# Patient Record
Sex: Male | Born: 1935
Health system: Southern US, Community
[De-identification: ages and names within clinical notes are randomized; demographics above are authoritative.]

## PROBLEM LIST (undated history)

## (undated) DIAGNOSIS — D696 Thrombocytopenia, unspecified: Secondary | ICD-10-CM

## (undated) DIAGNOSIS — G43109 Migraine with aura, not intractable, without status migrainosus: Secondary | ICD-10-CM

## (undated) DIAGNOSIS — T7840XA Allergy, unspecified, initial encounter: Secondary | ICD-10-CM

## (undated) DIAGNOSIS — J302 Other seasonal allergic rhinitis: Secondary | ICD-10-CM

## (undated) DIAGNOSIS — B029 Zoster without complications: Secondary | ICD-10-CM

## (undated) DIAGNOSIS — Z9289 Personal history of other medical treatment: Secondary | ICD-10-CM

## (undated) DIAGNOSIS — I499 Cardiac arrhythmia, unspecified: Secondary | ICD-10-CM

## (undated) DIAGNOSIS — J42 Unspecified chronic bronchitis: Secondary | ICD-10-CM

## (undated) DIAGNOSIS — K219 Gastro-esophageal reflux disease without esophagitis: Secondary | ICD-10-CM

## (undated) DIAGNOSIS — J189 Pneumonia, unspecified organism: Secondary | ICD-10-CM

## (undated) DIAGNOSIS — M316 Other giant cell arteritis: Secondary | ICD-10-CM

## (undated) DIAGNOSIS — F419 Anxiety disorder, unspecified: Secondary | ICD-10-CM

## (undated) DIAGNOSIS — I10 Essential (primary) hypertension: Secondary | ICD-10-CM

## (undated) DIAGNOSIS — I2510 Atherosclerotic heart disease of native coronary artery without angina pectoris: Secondary | ICD-10-CM

## (undated) DIAGNOSIS — C679 Malignant neoplasm of bladder, unspecified: Secondary | ICD-10-CM

## (undated) DIAGNOSIS — H9190 Unspecified hearing loss, unspecified ear: Secondary | ICD-10-CM

## (undated) DIAGNOSIS — H269 Unspecified cataract: Secondary | ICD-10-CM

## (undated) HISTORY — DX: Zoster without complications: B02.9

## (undated) HISTORY — DX: Unspecified cataract: H26.9

## (undated) HISTORY — PX: OTHER SURGICAL HISTORY: SHX169

## (undated) HISTORY — PX: SKIN GRAFT: SHX250

## (undated) HISTORY — DX: Atherosclerotic heart disease of native coronary artery without angina pectoris: I25.10

## (undated) HISTORY — DX: Anxiety disorder, unspecified: F41.9

## (undated) HISTORY — DX: Thrombocytopenia, unspecified: D69.6

## (undated) HISTORY — PX: VASECTOMY: SHX75

## (undated) HISTORY — PX: TONSILLECTOMY: SUR1361

## (undated) HISTORY — DX: Allergy, unspecified, initial encounter: T78.40XA

## (undated) HISTORY — PX: TRANSURETHRAL RESECTION OF BLADDER TUMOR: SHX2575

## (undated) HISTORY — PX: COLONOSCOPY: SHX174

---
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
## 1962-06-14 HISTORY — PX: OTHER SURGICAL HISTORY: SHX169

## 1997-11-19 ENCOUNTER — Encounter: Admission: RE | Admit: 1997-11-19 | Discharge: 1998-02-17 | Payer: Self-pay | Admitting: Anesthesiology

## 1998-01-16 ENCOUNTER — Ambulatory Visit (HOSPITAL_COMMUNITY): Admission: RE | Admit: 1998-01-16 | Discharge: 1998-01-16 | Payer: Self-pay | Admitting: Specialist

## 1998-01-19 ENCOUNTER — Inpatient Hospital Stay (HOSPITAL_COMMUNITY): Admission: EM | Admit: 1998-01-19 | Discharge: 1998-01-21 | Payer: Self-pay

## 1998-04-30 ENCOUNTER — Ambulatory Visit (HOSPITAL_BASED_OUTPATIENT_CLINIC_OR_DEPARTMENT_OTHER): Admission: RE | Admit: 1998-04-30 | Discharge: 1998-04-30 | Payer: Self-pay | Admitting: *Deleted

## 1998-09-24 ENCOUNTER — Encounter: Payer: Self-pay | Admitting: Family Medicine

## 1998-09-24 ENCOUNTER — Inpatient Hospital Stay (HOSPITAL_COMMUNITY): Admission: EM | Admit: 1998-09-24 | Discharge: 1998-09-26 | Payer: Self-pay | Admitting: Emergency Medicine

## 1998-09-25 ENCOUNTER — Encounter: Payer: Self-pay | Admitting: Family Medicine

## 1999-09-09 ENCOUNTER — Encounter: Admission: RE | Admit: 1999-09-09 | Discharge: 1999-09-09 | Payer: Self-pay | Admitting: Family Medicine

## 1999-09-09 ENCOUNTER — Encounter: Payer: Self-pay | Admitting: Family Medicine

## 1999-12-10 ENCOUNTER — Emergency Department (HOSPITAL_COMMUNITY): Admission: EM | Admit: 1999-12-10 | Discharge: 1999-12-10 | Payer: Self-pay | Admitting: Emergency Medicine

## 1999-12-10 ENCOUNTER — Encounter: Payer: Self-pay | Admitting: Emergency Medicine

## 2000-07-28 ENCOUNTER — Ambulatory Visit (HOSPITAL_COMMUNITY): Admission: RE | Admit: 2000-07-28 | Discharge: 2000-07-28 | Payer: Self-pay | Admitting: Gastroenterology

## 2001-11-09 ENCOUNTER — Encounter: Payer: Self-pay | Admitting: Emergency Medicine

## 2001-11-09 ENCOUNTER — Emergency Department (HOSPITAL_COMMUNITY): Admission: EM | Admit: 2001-11-09 | Discharge: 2001-11-09 | Payer: Self-pay | Admitting: Emergency Medicine

## 2001-11-14 ENCOUNTER — Emergency Department (HOSPITAL_COMMUNITY): Admission: EM | Admit: 2001-11-14 | Discharge: 2001-11-14 | Payer: Self-pay | Admitting: Emergency Medicine

## 2001-11-14 ENCOUNTER — Encounter: Payer: Self-pay | Admitting: Emergency Medicine

## 2001-12-29 ENCOUNTER — Ambulatory Visit (HOSPITAL_COMMUNITY): Admission: RE | Admit: 2001-12-29 | Discharge: 2001-12-29 | Payer: Self-pay | Admitting: Neurology

## 2001-12-29 ENCOUNTER — Encounter: Payer: Self-pay | Admitting: Neurology

## 2008-06-14 DIAGNOSIS — C679 Malignant neoplasm of bladder, unspecified: Secondary | ICD-10-CM

## 2008-06-14 HISTORY — DX: Malignant neoplasm of bladder, unspecified: C67.9

## 2008-10-02 ENCOUNTER — Encounter (INDEPENDENT_AMBULATORY_CARE_PROVIDER_SITE_OTHER): Payer: Self-pay | Admitting: Urology

## 2008-10-02 ENCOUNTER — Ambulatory Visit (HOSPITAL_BASED_OUTPATIENT_CLINIC_OR_DEPARTMENT_OTHER): Admission: RE | Admit: 2008-10-02 | Discharge: 2008-10-02 | Payer: Self-pay | Admitting: Urology

## 2008-10-21 ENCOUNTER — Encounter (INDEPENDENT_AMBULATORY_CARE_PROVIDER_SITE_OTHER): Payer: Self-pay | Admitting: Urology

## 2008-10-21 ENCOUNTER — Ambulatory Visit (HOSPITAL_COMMUNITY): Admission: RE | Admit: 2008-10-21 | Discharge: 2008-10-21 | Payer: Self-pay | Admitting: Urology

## 2008-10-31 ENCOUNTER — Emergency Department (HOSPITAL_COMMUNITY): Admission: EM | Admit: 2008-10-31 | Discharge: 2008-10-31 | Payer: Self-pay | Admitting: Emergency Medicine

## 2008-11-23 ENCOUNTER — Inpatient Hospital Stay (HOSPITAL_COMMUNITY): Admission: EM | Admit: 2008-11-23 | Discharge: 2008-11-25 | Payer: Self-pay | Admitting: Emergency Medicine

## 2009-03-13 ENCOUNTER — Ambulatory Visit (HOSPITAL_BASED_OUTPATIENT_CLINIC_OR_DEPARTMENT_OTHER): Admission: RE | Admit: 2009-03-13 | Discharge: 2009-03-13 | Payer: Self-pay | Admitting: Urology

## 2009-03-13 ENCOUNTER — Encounter (INDEPENDENT_AMBULATORY_CARE_PROVIDER_SITE_OTHER): Payer: Self-pay | Admitting: Urology

## 2009-08-04 ENCOUNTER — Encounter: Admission: RE | Admit: 2009-08-04 | Discharge: 2009-08-04 | Payer: Self-pay | Admitting: Family Medicine

## 2010-09-11 ENCOUNTER — Emergency Department (HOSPITAL_COMMUNITY)
Admission: EM | Admit: 2010-09-11 | Discharge: 2010-09-11 | Disposition: A | Discharge status: Home / Self Care | Payer: Medicare Other | Attending: Emergency Medicine | Admitting: Emergency Medicine

## 2010-09-11 DIAGNOSIS — Z79899 Other long term (current) drug therapy: Secondary | ICD-10-CM | POA: Insufficient documentation

## 2010-09-11 DIAGNOSIS — S30860A Insect bite (nonvenomous) of lower back and pelvis, initial encounter: Secondary | ICD-10-CM | POA: Insufficient documentation

## 2010-09-11 DIAGNOSIS — W57XXXA Bitten or stung by nonvenomous insect and other nonvenomous arthropods, initial encounter: Secondary | ICD-10-CM | POA: Insufficient documentation

## 2010-09-11 DIAGNOSIS — Z8551 Personal history of malignant neoplasm of bladder: Secondary | ICD-10-CM | POA: Insufficient documentation

## 2010-09-18 LAB — POCT HEMOGLOBIN-HEMACUE: Hemoglobin: 16.6 g/dL (ref 13.0–17.0)

## 2010-09-21 LAB — URINALYSIS, ROUTINE W REFLEX MICROSCOPIC
Glucose, UA: NEGATIVE mg/dL
Ketones, ur: 40 mg/dL — AB
Nitrite: POSITIVE — AB
Protein, ur: >300 mg/dL — AB
Specific Gravity, Urine: 1.01 (ref 1.005–1.030)
Urobilinogen, UA: 1 mg/dL (ref 0.0–1.0)
pH: 5 (ref 5.0–8.0)

## 2010-09-21 LAB — CBC
HCT: 42.7 % (ref 39.0–52.0)
Hemoglobin: 14.3 g/dL (ref 13.0–17.0)
MCHC: 33.6 g/dL (ref 30.0–36.0)
MCV: 96.4 fL (ref 78.0–100.0)
Platelets: 129 10*3/uL — ABNORMAL LOW (ref 150–400)
RBC: 4.43 MIL/uL (ref 4.22–5.81)
RDW: 13.1 % (ref 11.5–15.5)
WBC: 8.3 10*3/uL (ref 4.0–10.5)

## 2010-09-21 LAB — DIFFERENTIAL
Basophils Absolute: 0.1 10*3/uL (ref 0.0–0.1)
Basophils Relative: 1 % (ref 0–1)
Eosinophils Absolute: 0.2 10*3/uL (ref 0.0–0.7)
Eosinophils Relative: 3 % (ref 0–5)
Lymphocytes Relative: 12 % (ref 12–46)
Lymphs Abs: 1 10*3/uL (ref 0.7–4.0)
Monocytes Absolute: 0.3 10*3/uL (ref 0.1–1.0)
Monocytes Relative: 4 % (ref 3–12)
Neutro Abs: 6.7 10*3/uL (ref 1.7–7.7)
Neutrophils Relative %: 80 % — ABNORMAL HIGH (ref 43–77)

## 2010-09-21 LAB — POCT I-STAT, CHEM 8
BUN: 15 mg/dL (ref 6–23)
Calcium, Ion: 1.04 mmol/L — ABNORMAL LOW (ref 1.12–1.32)
Chloride: 109 mEq/L (ref 96–112)
Creatinine, Ser: 1.5 mg/dL (ref 0.4–1.5)
Glucose, Bld: 138 mg/dL — ABNORMAL HIGH (ref 70–99)
HCT: 40 % (ref 39.0–52.0)
Hemoglobin: 13.6 g/dL (ref 13.0–17.0)
Potassium: 3.7 mEq/L (ref 3.5–5.1)
Sodium: 140 mEq/L (ref 135–145)
TCO2: 19 mmol/L (ref 0–100)

## 2010-09-21 LAB — URINE CULTURE
Colony Count: NO GROWTH
Culture: NO GROWTH

## 2010-09-21 LAB — URINE MICROSCOPIC-ADD ON

## 2010-09-22 LAB — BASIC METABOLIC PANEL
BUN: 13 mg/dL (ref 6–23)
CO2: 29 mEq/L (ref 19–32)
Calcium: 9.2 mg/dL (ref 8.4–10.5)
Chloride: 112 mEq/L (ref 96–112)
Creatinine, Ser: 1.24 mg/dL (ref 0.4–1.5)
GFR calc Af Amer: >60 mL/min (ref >60)
GFR calc non Af Amer: 57 mL/min — ABNORMAL LOW (ref >60)
Glucose, Bld: 109 mg/dL — ABNORMAL HIGH (ref 70–99)
Potassium: 4.6 mEq/L (ref 3.5–5.1)
Sodium: 147 mEq/L — ABNORMAL HIGH (ref 135–145)

## 2010-09-22 LAB — POCT CARDIAC MARKERS
CKMB, poc: <1 ng/mL — ABNORMAL LOW (ref 1.0–8.0)
Myoglobin, poc: 83.1 ng/mL (ref 12–200)
Troponin i, poc: <0.05 ng/mL (ref 0.00–0.09)

## 2010-09-22 LAB — URINE MICROSCOPIC-ADD ON

## 2010-09-22 LAB — URINALYSIS, ROUTINE W REFLEX MICROSCOPIC
Bilirubin Urine: NEGATIVE
Glucose, UA: NEGATIVE mg/dL
Ketones, ur: NEGATIVE mg/dL
Nitrite: NEGATIVE
Protein, ur: NEGATIVE mg/dL
Specific Gravity, Urine: 1.009 (ref 1.005–1.030)
Urobilinogen, UA: 1 mg/dL (ref 0.0–1.0)
pH: 6.5 (ref 5.0–8.0)

## 2010-09-22 LAB — DIFFERENTIAL
Basophils Absolute: 0 10*3/uL (ref 0.0–0.1)
Basophils Relative: 0 % (ref 0–1)
Eosinophils Absolute: 0.2 10*3/uL (ref 0.0–0.7)
Eosinophils Relative: 2 % (ref 0–5)
Lymphocytes Relative: 7 % — ABNORMAL LOW (ref 12–46)
Lymphs Abs: 0.9 10*3/uL (ref 0.7–4.0)
Monocytes Absolute: 0.8 10*3/uL (ref 0.1–1.0)
Monocytes Relative: 6 % (ref 3–12)
Neutro Abs: 10.3 10*3/uL — ABNORMAL HIGH (ref 1.7–7.7)
Neutrophils Relative %: 84 % — ABNORMAL HIGH (ref 43–77)

## 2010-09-22 LAB — CBC
HCT: 47.3 % (ref 39.0–52.0)
Hemoglobin: 16.5 g/dL (ref 13.0–17.0)
MCHC: 34.9 g/dL (ref 30.0–36.0)
MCV: 93.4 fL (ref 78.0–100.0)
Platelets: 125 10*3/uL — ABNORMAL LOW (ref 150–400)
RBC: 5.07 MIL/uL (ref 4.22–5.81)
RDW: 13.6 % (ref 11.5–15.5)
WBC: 12.3 10*3/uL — ABNORMAL HIGH (ref 4.0–10.5)

## 2010-09-22 LAB — URINE CULTURE
Colony Count: NO GROWTH
Culture: NO GROWTH

## 2010-09-22 LAB — COMPREHENSIVE METABOLIC PANEL
ALT: 15 U/L (ref 0–53)
AST: 17 U/L (ref 0–37)
Albumin: 3.7 g/dL (ref 3.5–5.2)
Alkaline Phosphatase: 68 U/L (ref 39–117)
BUN: 6 mg/dL (ref 6–23)
CO2: 24 mEq/L (ref 19–32)
Calcium: 8.6 mg/dL (ref 8.4–10.5)
Chloride: 108 mEq/L (ref 96–112)
Creatinine, Ser: 1.01 mg/dL (ref 0.4–1.5)
GFR calc Af Amer: >60 mL/min (ref >60)
GFR calc non Af Amer: >60 mL/min (ref >60)
Glucose, Bld: 122 mg/dL — ABNORMAL HIGH (ref 70–99)
Potassium: 3.3 mEq/L — ABNORMAL LOW (ref 3.5–5.1)
Sodium: 137 mEq/L (ref 135–145)
Total Bilirubin: 1.3 mg/dL — ABNORMAL HIGH (ref 0.3–1.2)
Total Protein: 6.3 g/dL (ref 6.0–8.3)

## 2010-09-22 LAB — LIPASE, BLOOD: Lipase: 19 U/L (ref 11–59)

## 2010-09-22 LAB — GLUCOSE, CAPILLARY: Glucose-Capillary: 128 mg/dL — ABNORMAL HIGH (ref 70–99)

## 2010-09-22 LAB — HEMOGLOBIN AND HEMATOCRIT, BLOOD
HCT: 45.8 % (ref 39.0–52.0)
Hemoglobin: 16.3 g/dL (ref 13.0–17.0)

## 2010-09-22 LAB — LACTIC ACID, PLASMA: Lactic Acid, Venous: 1.4 mmol/L (ref 0.5–2.2)

## 2010-09-23 LAB — BASIC METABOLIC PANEL
BUN: 11 mg/dL (ref 6–23)
CO2: 29 mEq/L (ref 19–32)
Calcium: 9 mg/dL (ref 8.4–10.5)
Chloride: 105 mEq/L (ref 96–112)
Creatinine, Ser: 1.28 mg/dL (ref 0.4–1.5)
GFR calc Af Amer: >60 mL/min (ref >60)
GFR calc non Af Amer: 55 mL/min — ABNORMAL LOW (ref >60)
Glucose, Bld: 54 mg/dL — ABNORMAL LOW (ref 70–99)
Potassium: 3.8 mEq/L (ref 3.5–5.1)
Sodium: 142 mEq/L (ref 135–145)

## 2010-09-23 LAB — POCT HEMOGLOBIN-HEMACUE: Hemoglobin: 16 g/dL (ref 13.0–17.0)

## 2010-10-27 NOTE — Op Note (Signed)
NAME:  Ronald Russell, Ronald Russell NO.:  [id number]   MEDICAL RECORD NO.:  [id number]          PATIENT TYPE:  AMB   LOCATION:  DAY                          FACILITY:  [facility]   PHYSICIAN:  Heloise Purpura, MD      DATE OF BIRTH:  13-Nov-1934   DATE OF PROCEDURE:  10/21/2008  DATE OF DISCHARGE:                               OPERATIVE REPORT   PREOPERATIVE DIAGNOSIS:  Bladder cancer.   POSTOPERATIVE DIAGNOSIS:  Bladder cancer.   PROCEDURES:  1. Cystoscopy.  2. Transurethral resection of bladder tumor.   ANESTHESIA:  General.   COMPLICATIONS:  None.   ESTIMATED BLOOD LOSS:  Minimal.   INDICATIONS:  [name] is a 76 year old gentleman who was recently  evaluated for hematuria and found to have two separate bladder tumors.  He underwent an initial transurethral resection which demonstrated high-  grade urothelial carcinoma with focal lamina propria invasion.  In  addition, he was found to have concomitant carcinoma in situ of the  bladder.  At the initial resection site that was found to have lamina  propria invasion, no muscularis propria was noted in the initial biopsy.  After discussing these findings with the patient, it was recommended  that he proceed with a restaging procedure and, therefore, is taken to  the operating room for that procedure today.  The potential risks,  complications, and alternative options were discussed in detail, and  informed consent was obtained.   DESCRIPTION OF PROCEDURE:  The patient was taken to the operating room  and a general anesthetic was administered.  He was given preoperative  antibiotics, placed in the dorsal lithotomy position, and prepped and  draped in the usual sterile fashion.  Next, a preoperative timeout was  performed.  Cystourethroscopy was then performed which revealed healing  resection sites at the bladder neck, mid-posterior aspect of the  bladder, and left lateral bladder wall.  No obvious residual tumor was  noted.   The ureteral orifices were identified and appeared to be well  away from the resection site.  The bladder neck site was the area of  concern and a 24-French resectoscope sheath was then placed into the  bladder.  The bladder neck resection site was then re-resected to allow  resection into the deep muscular layer.  This was cauterized with  electrocautery, and the bladder was emptied and re-inspected.  Hemostasis appeared excellent and it was felt that it would not be  necessary to place a catheter.  The patient tolerated the procedure well  and without complications.  He was able to be awakened and transferred  to the recovery unit in satisfactory condition.      Heloise Purpura, MD  Electronically Signed     LB/MEDQ  D:  10/21/2008  T:  10/21/2008  Job:  [id number]

## 2010-10-27 NOTE — Op Note (Signed)
NAME:  Ronald Russell, Ronald Russell NO.:  [id number]   MEDICAL RECORD NO.:  [id number]          PATIENT TYPE:  AMB   LOCATION:  [facility]                         FACILITY:  [facility]   PHYSICIAN:  Ronald Purpura, MD      DATE OF BIRTH:  1934-10-15   DATE OF PROCEDURE:  10/02/2008  DATE OF DISCHARGE:                               OPERATIVE REPORT   PREOPERATIVE DIAGNOSIS:  Bladder tumor.   POSTOPERATIVE DIAGNOSIS:  Bladder tumor.   PROCEDURE:  1. Cystoscopy.  2. Transurethral resection of multiple bladder tumors (largest 2-3      cm.)  3. Pelvic exam under anesthesia.   SURGEON:  Ronald Russell, M.D.   ANESTHESIA:  General.   COMPLICATIONS:  None.   ESTIMATED BLOOD LOSS:  Minimal.   SPECIMENS:  1. Bladder neck tumor.  2. Posterior bladder tumor.  3. Deep biopsies of posterior bladder tumor.  4. Biopsy of left lateral bladder.   DISPOSITION OF SPECIMENS:  To pathology.   INDICATION:  Ronald Russell is a 76 year old gentleman who presented with  gross hematuria.  He was evaluated and found to have multiple bladder  tumors on cystoscopy.  After discussion regarding management options, he  elected to proceed with the above procedure for diagnostic and staging  purposes.  The potential risks, complications, and alternative treatment  options were discussed in detail and informed consent was obtained.   DESCRIPTION OF PROCEDURE:  The patient was taken to the operating room  and a general anesthetic was administered.  He was given preoperative  antibiotics, placed in the dorsal lithotomy position, and prepped and  draped in the usual sterile fashion.  Next a preoperative time-out was  performed.  Cystourethroscopy was then performed with both the 12- and  70-degree lens and the 22-French cystoscope sheath.  This revealed a 2-3  cm papillary bladder tumor centrally located in the posterior aspect of  the bladder.  There was also a papillary bladder tumor at the bladder  neck at  approximately 7 o'clock.  In addition, there were small  papillary tumors along the base of the bladder.  There was also noted to  be an erythematous area along the left lateral bladder wall.  In  addition, there were noted to be some small stones noted within the  bladder.  The ureteral orifices were in the normal anatomic position and  effluxing clear urine.  Both orifices were away from any of the tumors.  The the urethra was then dilated serial serially with Sissy Hoff sounds  up to 28-French.  The 26-French resectoscope sheath was then inserted  into the bladder and using loop resection, the bladder neck tumor was  resected in its entirety.  Next, the posterior bladder tumor was  resected and sent as a separate specimen.  Deep cold cup biopsies of the  posterior bladder tumor were then performed.  The left lateral  erythematous area was then biopsied with the cold cup biopsy forceps.  Hemostasis was achieved with electrocautery and the bladder was emptied  and reinspected.  Hemostasis appeared excellent.  The small stones were  also removed via the resectoscope sheath.  An 18-French Foley catheter  was then inserted into the bladder and 40 mg of mitomycin in 40 mL of  sterile water was instilled into the patient's bladder.  There was no  evidence for perforation of the bladder during the procedure today.  The  mitomycin was left indwelling for 1 hour.   PLAN:  Ronald Russell will return in 2 days for a voiding trial and catheter  removal.  It was felt that the catheter should be left in place at this  time due to the fact that his 1 tumor was noted to be at the bladder  neck and may put him at an increased risk for hematuria postoperatively.      Ronald Purpura, MD  Electronically Signed     LB/MEDQ  D:  10/02/2008  T:  10/02/2008  Job:  [id number]

## 2010-10-27 NOTE — Discharge Summary (Signed)
NAME:  TRACKER, MANCE NO.:  [id number]   MEDICAL RECORD NO.:  [id number]          PATIENT TYPE:  INP   LOCATION:  1438                         FACILITY:  [facility]   PHYSICIAN:  Heloise Purpura, MD      DATE OF BIRTH:  1935-04-19   DATE OF ADMISSION:  11/22/2008  DATE OF DISCHARGE:  11/25/2008                               DISCHARGE SUMMARY   ADMISSION DIAGNOSES:  1. Bladder cancer.  2. Hematuria with clot urinary retention.   DISCHARGE DIAGNOSES:  1. Bladder cancer.  2. Hematuria with clot urinary retention.   HISTORY AND PHYSICAL:  For full details, please see admission history  and physical.  Briefly, Mr. Ky is a 76 year old gentleman who  recently presented with hematuria and was found to have multiple bladder  tumors.  He underwent transurethral resection of these bladder tumors  and is scheduled to undergo intravesical therapy in the near future.  He  developed gross hematuria with the inability to void on November 22, 2008.  He presented to the emergency department and was evaluated and admitted  to the hospital by Dr. Gaynelle Arabian.   HOSPITAL COURSE:  On November 22, 2008, the patient was admitted to hospital  by Dr. Gaynelle Arabian, who was on call that weekend.  The patient had a  Foley catheter placed in the emergency department and continued to have  difficulty with it draining.  Subsequently, a 24-French Ainsworth  catheter was placed with multiple clots removed from the bladder.  The  patient also developed problems with nausea and vomiting and was felt to  have problems with constipation.  An acute abdominal series was  performed which did not demonstrate any evidence for bowel obstruction.  It was also felt by the patient that the ciprofloxacin which he was  administered was also likely causing him problems with nausea.  This was  therefore discontinued.  His urine gradually cleared with the indwelling  catheter.  His nausea and vomiting improved and he  had bowel movements  after an enema was administered.  On November 25, 2008, he was evaluated.  His urine was clear.  His Foley catheter was therefore removed and he  was given a voiding trial and was able to be discharged home.   DISPOSITION:  Home.   DISCHARGE MEDICATIONS:  He was instructed to resume his regular home  medications except any aspirin or nonsteroidal anti-inflammatory drugs.  He was given a prescription to take nitrofurantoin for prophylaxis and  was given a prescription for Vesicare.   DISCHARGE INSTRUCTIONS:  He was instructed to resume his regular diet  but was told to refrain from any heavy lifting or strenuous activity.   FOLLOW-UP:  He will follow up as scheduled in approximately 2 weeks for  further evaluation and possibly to begin intravesical therapy with BCG.      Heloise Purpura, MD  Electronically Signed     LB/MEDQ  D:  11/25/2008  T:  11/25/2008  Job:  [id number]

## 2010-10-30 NOTE — Procedures (Signed)
[facility]  Patient:    Ronald Russell, Ronald Russell                       MRN: [id number] Proc. Date: 07/28/00 Adm. Date:  [id number] Attending:  Orland Mustard CC:         Carolyne Fiscal, M.D.   Procedure Report  PROCEDURE:  Colonoscopy.  MEDICATIONS:  Fentanyl 100 mcg, Versed 8 mg IV.  ENDOSCOPE:  Olympus adult video colonoscope.  INDICATIONS:  Previous history of adenomatous colon polyps.  This is a three-year follow-up.  DESCRIPTION OF PROCEDURE:  The procedure had been explained to the patient and consent obtained.  With the patient in the left lateral decubitus position, the digital examination was performed, and the Olympus scope was inserted and advanced under direct visualization.  The prep was excellent, I was able to reach the cecum without difficulty; the ileocecal valve and appendiceal orifice were seen.  The scope was withdrawn -- the cecum, ascending colon, hepatic flexure, transverse colon, splenic flexure, descending and sigmoid colon were seen well upon removal.  No polyps were seen.  A lot of internal hemorrhoids were seen in the rectum.  The scope was withdrawn and the patient tolerated the procedure well.  ASSESSMENT: 1. No evidence of further polyps. 2. Moderate internal hemorrhoids.  PLAN:  Will recommend a five-year repeat colonoscopy to screen for additional colon polyps.  Will go ahead and check his stools on a yearly basis. DD:  07/28/00 TD:  07/29/00 Job: [id number]

## 2011-07-15 ENCOUNTER — Institutional Professional Consult (permissible substitution): Payer: Medicare Other | Admitting: Internal Medicine

## 2011-07-16 HISTORY — PX: CARDIOVASCULAR STRESS TEST: SHX262

## 2011-07-31 ENCOUNTER — Emergency Department (HOSPITAL_COMMUNITY): Payer: Medicare Other

## 2011-07-31 ENCOUNTER — Other Ambulatory Visit: Payer: Self-pay

## 2011-07-31 ENCOUNTER — Observation Stay (HOSPITAL_COMMUNITY)
Admission: EM | Admit: 2011-07-31 | Discharge: 2011-08-01 | Disposition: A | Discharge status: Home / Self Care | Payer: Medicare Other | Attending: Internal Medicine | Admitting: Internal Medicine

## 2011-07-31 ENCOUNTER — Encounter (HOSPITAL_COMMUNITY): Payer: Self-pay | Admitting: Emergency Medicine

## 2011-07-31 DIAGNOSIS — R002 Palpitations: Secondary | ICD-10-CM | POA: Insufficient documentation

## 2011-07-31 DIAGNOSIS — R079 Chest pain, unspecified: Secondary | ICD-10-CM

## 2011-07-31 DIAGNOSIS — E876 Hypokalemia: Secondary | ICD-10-CM | POA: Insufficient documentation

## 2011-07-31 DIAGNOSIS — I493 Ventricular premature depolarization: Secondary | ICD-10-CM | POA: Diagnosis present

## 2011-07-31 DIAGNOSIS — I4949 Other premature depolarization: Secondary | ICD-10-CM | POA: Insufficient documentation

## 2011-07-31 DIAGNOSIS — R072 Precordial pain: Principal | ICD-10-CM | POA: Insufficient documentation

## 2011-07-31 DIAGNOSIS — R42 Dizziness and giddiness: Secondary | ICD-10-CM | POA: Diagnosis present

## 2011-07-31 DIAGNOSIS — J329 Chronic sinusitis, unspecified: Secondary | ICD-10-CM | POA: Insufficient documentation

## 2011-07-31 DIAGNOSIS — R0602 Shortness of breath: Secondary | ICD-10-CM | POA: Insufficient documentation

## 2011-07-31 DIAGNOSIS — I10 Essential (primary) hypertension: Secondary | ICD-10-CM | POA: Insufficient documentation

## 2011-07-31 DIAGNOSIS — I951 Orthostatic hypotension: Secondary | ICD-10-CM | POA: Insufficient documentation

## 2011-07-31 HISTORY — DX: Pneumonia, unspecified organism: J18.9

## 2011-07-31 HISTORY — DX: Malignant neoplasm of bladder, unspecified: C67.9

## 2011-07-31 LAB — CBC
HCT: 46.1 % (ref 39.0–52.0)
HCT: 43.9 % (ref 39.0–52.0)
Hemoglobin: 16.3 g/dL (ref 13.0–17.0)
Hemoglobin: 15.5 g/dL (ref 13.0–17.0)
MCH: 32.4 pg (ref 26.0–34.0)
MCH: 32.4 pg (ref 26.0–34.0)
MCHC: 35.4 g/dL (ref 30.0–36.0)
MCHC: 35.3 g/dL (ref 30.0–36.0)
MCV: 91.7 fL (ref 78.0–100.0)
MCV: 91.6 fL (ref 78.0–100.0)
Platelets: 158 10*3/uL (ref 150–400)
Platelets: 152 10*3/uL (ref 150–400)
RBC: 5.03 MIL/uL (ref 4.22–5.81)
RBC: 4.79 MIL/uL (ref 4.22–5.81)
RDW: 14.6 % (ref 11.5–15.5)
RDW: 14.6 % (ref 11.5–15.5)
WBC: 7.1 10*3/uL (ref 4.0–10.5)
WBC: 7.7 10*3/uL (ref 4.0–10.5)

## 2011-07-31 LAB — BLOOD GAS, ARTERIAL
Acid-Base Excess: 1 mmol/L (ref 0.0–2.0)
Bicarbonate: 24.6 mEq/L — ABNORMAL HIGH (ref 20.0–24.0)
Drawn by: 23588
FIO2: 21 %
O2 Saturation: 96.8 %
Patient temperature: 98.6
TCO2: 25.7 mmol/L (ref 0–100)
pCO2 arterial: 36.1 mmHg (ref 35.0–45.0)
pH, Arterial: 7.448 (ref 7.350–7.450)
pO2, Arterial: 79.8 mmHg — ABNORMAL LOW (ref 80.0–100.0)

## 2011-07-31 LAB — COMPREHENSIVE METABOLIC PANEL
ALT: 16 U/L (ref 0–53)
AST: 19 U/L (ref 0–37)
Albumin: 3.9 g/dL (ref 3.5–5.2)
Alkaline Phosphatase: 61 U/L (ref 39–117)
BUN: 11 mg/dL (ref 6–23)
CO2: 22 mEq/L (ref 19–32)
Calcium: 9.7 mg/dL (ref 8.4–10.5)
Chloride: 103 mEq/L (ref 96–112)
Creatinine, Ser: 1.08 mg/dL (ref 0.50–1.35)
GFR calc Af Amer: 76 mL/min — ABNORMAL LOW (ref >90)
GFR calc non Af Amer: 65 mL/min — ABNORMAL LOW (ref >90)
Glucose, Bld: 127 mg/dL — ABNORMAL HIGH (ref 70–99)
Potassium: 3.8 mEq/L (ref 3.5–5.1)
Sodium: 137 mEq/L (ref 135–145)
Total Bilirubin: 0.6 mg/dL (ref 0.3–1.2)
Total Protein: 7.1 g/dL (ref 6.0–8.3)

## 2011-07-31 LAB — TROPONIN I: Troponin I: <0.3 ng/mL (ref <0.30)

## 2011-07-31 LAB — DIFFERENTIAL
Basophils Absolute: 0.1 10*3/uL (ref 0.0–0.1)
Basophils Relative: 1 % (ref 0–1)
Eosinophils Absolute: 0.3 10*3/uL (ref 0.0–0.7)
Eosinophils Relative: 4 % (ref 0–5)
Lymphocytes Relative: 24 % (ref 12–46)
Lymphs Abs: 1.8 10*3/uL (ref 0.7–4.0)
Monocytes Absolute: 0.7 10*3/uL (ref 0.1–1.0)
Monocytes Relative: 10 % (ref 3–12)
Neutro Abs: 4.8 10*3/uL (ref 1.7–7.7)
Neutrophils Relative %: 62 % (ref 43–77)

## 2011-07-31 LAB — CARDIAC PANEL(CRET KIN+CKTOT+MB+TROPI)
CK, MB: 2.2 ng/mL (ref 0.3–4.0)
Relative Index: INVALID (ref 0.0–2.5)
Total CK: 54 U/L (ref 7–232)
Troponin I: <0.3 ng/mL (ref <0.30)

## 2011-07-31 LAB — PROTIME-INR
INR: 0.91 (ref 0.00–1.49)
Prothrombin Time: 12.5 seconds (ref 11.6–15.2)

## 2011-07-31 LAB — CREATININE, SERUM
Creatinine, Ser: 1.08 mg/dL (ref 0.50–1.35)
GFR calc Af Amer: 76 mL/min — ABNORMAL LOW (ref >90)
GFR calc non Af Amer: 65 mL/min — ABNORMAL LOW (ref >90)

## 2011-07-31 MED ORDER — ASPIRIN 81 MG PO CHEW
324.0000 mg | CHEWABLE_TABLET | Freq: Once | ORAL | Status: AC
  Administered 2011-07-31: 324 mg via ORAL
  Filled 2011-07-31: qty 4

## 2011-07-31 MED ORDER — SODIUM CHLORIDE 0.9 % IV SOLN
INTRAVENOUS | Status: DC
  Administered 2011-07-31: 21:00:00 via INTRAVENOUS

## 2011-07-31 MED ORDER — ENOXAPARIN SODIUM 40 MG/0.4ML ~~LOC~~ SOLN
40.0000 mg | SUBCUTANEOUS | Status: DC
  Filled 2011-07-31: qty 0.4

## 2011-07-31 MED ORDER — LEVALBUTEROL HCL 0.63 MG/3ML IN NEBU
0.6300 mg | INHALATION_SOLUTION | Freq: Four times a day (QID) | RESPIRATORY_TRACT | Status: DC | PRN
  Filled 2011-07-31: qty 3

## 2011-07-31 MED ORDER — ACETAMINOPHEN 500 MG PO TABS
1000.0000 mg | ORAL_TABLET | Freq: Three times a day (TID) | ORAL | Status: DC | PRN
  Filled 2011-07-31: qty 2

## 2011-07-31 NOTE — ED Notes (Signed)
[id number] Ready 

## 2011-07-31 NOTE — Progress Notes (Signed)
Arrived to floor in NAD. Walked from stretcher to bed with min assist. Wife at bedside. Oriented to room. Call bell in reach . No complaints voiced at this time.

## 2011-07-31 NOTE — Consult Note (Signed)
Reason for Consult: Chest pain and palpitation  Referring Physician: [name], M.D.  Ronald Russell is an 77 y.o. male.   HPI: 77 y/o male who is currently been seen in consultation with [name] for evaluation of chest pain and palpitation.  He has no prior history of heart disease. He reports several months history of Bronchitis/Pneumonia and has been treated with several antibiotics and inhalers.  He reports that he developed exertional chest pain that started 5 days ago.  His pain was described as a chest tightness that last a few minutes and is associated with shortness of breath.  He denies diaphoresis, nausea or vomiting, PND or orthopnea.  He also complains of heart fluttering for the last 5 days as well. He has no dizziness or syncope. His first set of cardiac marker is negative and his ECG do not show any evidence of definite ischemia. He is not a tobacco smoker, and he has no family history of early coronary artery disease. He is currently chest pain free.  Past Medical History  Diagnosis Date  . Ear infection   . Pneumonia     Past Surgical History  Procedure Date  . Bladder surgery   . Mastoid tumor removed     History reviewed. No pertinent family history.  Social History:  reports that he has never smoked. He does not have any smokeless tobacco history on file. He reports that he does not drink alcohol or use illicit drugs.  Allergies:  Allergies  Allergen Reactions  . Codeine Other (See Comments)    Severe headache, nausea and vomiting    Medications: Reviewed  Results for orders placed during the hospital encounter of 07/31/11 (from the past 48 hour(s))  CBC     Status: Normal   Collection Time   07/31/11  2:36 PM      Component Value Range Comment   WBC 7.1  4.0 - 10.5 (K/uL)    RBC 5.03  4.22 - 5.81 (MIL/uL)    Hemoglobin 16.3  13.0 - 17.0 (g/dL)    HCT 16.1  09.6 - 04.5 (%)    MCV 91.7  78.0 - 100.0 (fL)    MCH 32.4  26.0 - 34.0 (pg)    MCHC  35.4  30.0 - 36.0 (g/dL)    RDW 40.9  81.1 - 91.4 (%)    Platelets 158  150 - 400 (K/uL)   COMPREHENSIVE METABOLIC PANEL     Status: Abnormal   Collection Time   07/31/11  2:36 PM      Component Value Range Comment   Sodium 137  135 - 145 (mEq/L)    Potassium 3.8  3.5 - 5.1 (mEq/L)    Chloride 103  96 - 112 (mEq/L)    CO2 22  19 - 32 (mEq/L)    Glucose, Bld 127 (*) 70 - 99 (mg/dL)    BUN 11  6 - 23 (mg/dL)    Creatinine, Ser 7.82  0.50 - 1.35 (mg/dL)    Calcium 9.7  8.4 - 10.5 (mg/dL)    Total Protein 7.1  6.0 - 8.3 (g/dL)    Albumin 3.9  3.5 - 5.2 (g/dL)    AST 19  0 - 37 (U/L)    ALT 16  0 - 53 (U/L)    Alkaline Phosphatase 61  39 - 117 (U/L)    Total Bilirubin 0.6  0.3 - 1.2 (mg/dL)    GFR calc non Af Amer 65 (*) >90 (mL/min)  GFR calc Af Amer 76 (*) >90 (mL/min)   TROPONIN I     Status: Normal   Collection Time   07/31/11  2:36 PM      Component Value Range Comment   Troponin I <0.30  <0.30 (ng/mL)   PROTIME-INR     Status: Normal   Collection Time   07/31/11  2:36 PM      Component Value Range Comment   Prothrombin Time 12.5  11.6 - 15.2 (seconds)    INR 0.91  0.00 - 1.49    CARDIAC PANEL(CRET KIN+CKTOT+MB+TROPI)     Status: Normal   Collection Time   07/31/11  8:00 PM      Component Value Range Comment   Total CK 54  7 - 232 (U/L)    CK, MB 2.2  0.3 - 4.0 (ng/mL)    Troponin I <0.30  <0.30 (ng/mL)    Relative Index RELATIVE INDEX IS INVALID  0.0 - 2.5    CBC     Status: Normal   Collection Time   07/31/11  8:00 PM      Component Value Range Comment   WBC 7.7  4.0 - 10.5 (K/uL)    RBC 4.79  4.22 - 5.81 (MIL/uL)    Hemoglobin 15.5  13.0 - 17.0 (g/dL)    HCT 16.1  09.6 - 04.5 (%)    MCV 91.6  78.0 - 100.0 (fL)    MCH 32.4  26.0 - 34.0 (pg)    MCHC 35.3  30.0 - 36.0 (g/dL)    RDW 40.9  81.1 - 91.4 (%)    Platelets 152  150 - 400 (K/uL)   CREATININE, SERUM     Status: Abnormal   Collection Time   07/31/11  8:00 PM      Component Value Range Comment    Creatinine, Ser 1.08  0.50 - 1.35 (mg/dL)    GFR calc non Af Amer 65 (*) >90 (mL/min)    GFR calc Af Amer 76 (*) >90 (mL/min)   DIFFERENTIAL     Status: Normal   Collection Time   07/31/11  8:00 PM      Component Value Range Comment   Neutrophils Relative 62  43 - 77 (%)    Neutro Abs 4.8  1.7 - 7.7 (K/uL)    Lymphocytes Relative 24  12 - 46 (%)    Lymphs Abs 1.8  0.7 - 4.0 (K/uL)    Monocytes Relative 10  3 - 12 (%)    Monocytes Absolute 0.7  0.1 - 1.0 (K/uL)    Eosinophils Relative 4  0 - 5 (%)    Eosinophils Absolute 0.3  0.0 - 0.7 (K/uL)    Basophils Relative 1  0 - 1 (%)    Basophils Absolute 0.1  0.0 - 0.1 (K/uL)   BLOOD GAS, ARTERIAL     Status: Abnormal   Collection Time   07/31/11  8:46 PM      Component Value Range Comment   FIO2 21.00      pH, Arterial 7.448  7.350 - 7.450     pCO2 arterial 36.1  35.0 - 45.0 (mmHg)    pO2, Arterial 79.8 (*) 80.0 - 100.0 (mmHg)    Bicarbonate 24.6 (*) 20.0 - 24.0 (mEq/L)    TCO2 25.7  0 - 100 (mmol/L)    Acid-Base Excess 1.0  0.0 - 2.0 (mmol/L)    O2 Saturation 96.8      Patient temperature 98.6  Collection site RIGHT RADIAL      Drawn by [phone]      Sample type ARTERIAL DRAW      Allens test (pass/fail) PASS  PASS      Dg Chest 2 View  07/31/2011  *RADIOLOGY REPORT*  Clinical Data: Chest pain shortness of breath  CHEST - 2 VIEW  Comparison: Chest radiograph 08/04/2009  Findings: Heart size is within normal limits.  Thoracic aorta contour is normal and stable.  Rounded contour in the mid upper abdomen/lower mediastinum is favored to be a moderate hiatal hernia contour and is stable.  The lungs are clear.  Negative for pleural effusion.  The bones are unremarkable.  IMPRESSION:  1.  No acute cardiopulmonary disease. 2.  Suspect moderate hiatal hernia, stable.  Original Report Authenticated By: Britta Mccreedy, M.D.    Review of Systems  Constitutional: Negative.  Negative for fever, chills, weight loss, malaise/fatigue and diaphoresis.   HENT: Negative.  Negative for hearing loss and neck pain.   Eyes: Negative.  Negative for blurred vision, double vision, photophobia and pain.  Respiratory: Negative.  Negative for cough, hemoptysis, sputum production and shortness of breath.   Cardiovascular: Positive for chest pain and palpitations. Negative for orthopnea, claudication and leg swelling.  Gastrointestinal: Negative.  Negative for heartburn, nausea and vomiting.  Genitourinary: Negative.  Negative for dysuria, urgency and frequency.  Musculoskeletal: Negative.  Negative for myalgias and back pain.  Skin: Negative.  Negative for itching and rash.  Neurological: Negative.  Negative for dizziness, tingling, seizures, weakness and headaches.  Endo/Heme/Allergies: Negative.  Does not bruise/bleed easily.  Psychiatric/Behavioral: Negative.  Negative for depression and hallucinations.   Blood pressure 117/71, pulse 75, temperature 97.7 F (36.5 C), temperature source Oral, resp. rate 18, height 5\' 11"  (1.803 m), weight 89.676 kg (197 lb 11.2 oz), SpO2 93.00%. Physical Exam  Constitutional: He is oriented to person, place, and time. He appears well-developed and well-nourished. No distress.  HENT:  Head: Normocephalic and atraumatic.  Eyes: EOM are normal. Right eye exhibits no discharge. Left eye exhibits no discharge. No scleral icterus.  Neck: Normal range of motion. Neck supple. No JVD present. No tracheal deviation present. No thyromegaly present.  Cardiovascular: Normal rate, regular rhythm and normal heart sounds.  Exam reveals no friction rub.   No murmur heard. Respiratory: Breath sounds normal. No respiratory distress. He has no wheezes. He has no rales.  GI: Soft. He exhibits distension. There is no tenderness. There is no rebound and no guarding.  Musculoskeletal: Normal range of motion. He exhibits no edema and no tenderness.  Neurological: He is alert and oriented to person, place, and time. No cranial nerve deficit.   Skin: Skin is warm and dry. No rash noted. He is not diaphoretic. No erythema.  Psychiatric: He has a normal mood and affect.    Assessment  1.  Chest pain 2.  Palpitation  Plan  I recommend observing patient on telemetry to determine his rhythm when he has a sensation of heart fluttering, and obtain serial cardiac markers to see if he rules in for myocardial infarction.  May obtain 2-D echocardiogram to determine his left and right ventricular systolic function.  He may undergo a pharmacologic stress test (since he is not able to exercise) for risk stratification; this can be done as an inpatient or an outpatient if he will be discharged before Monday. If he rules in for MI or he has significant LV dysfunction on 2-D echo, may consider a cardiac  cath.  Gemma Payor E 07/31/2011, 11:00 PM

## 2011-07-31 NOTE — ED Notes (Signed)
Pt reports seen by PMD yesterday and told to start a fluid pill today. Pt has appointment with cardiologist on march 4th.  Pt reports took new medication today and now has symptoms of feeling a flutter in his chest and dizziness.

## 2011-07-31 NOTE — ED Provider Notes (Signed)
History     CSN: [id number]  Arrival date & time 07/31/11  1410   First MD Initiated Contact with Patient 07/31/11 1501      Chief Complaint  Patient presents with  . Chest Pain  . Dizziness  . Palpitations    (Consider location/radiation/quality/duration/timing/severity/associated sxs/prior treatment) Patient is a 77 y.o. male presenting with chest pain and palpitations. The history is provided by the patient (Patient states that yesterday he was walking much easier does and he started having fluttering in his chest. Patient states it lasted for about 10-15 minutes when he rested it went away. Today the patient states that he had about an hour fourth of chest pr).  Chest Pain The chest pain began 6 - 12 hours ago. Chest pain occurs constantly. The chest pain is resolved. The pain is associated with stress. At its most intense, the pain is at 4/10. The pain is currently at 0/10. The severity of the pain is moderate. The quality of the pain is described as aching. The pain does not radiate. Chest pain is worsened by stress. Primary symptoms include palpitations. Pertinent negatives for primary symptoms include no fatigue, no cough and no abdominal pain. He tried nothing for the symptoms. Risk factors include male gender.  His past medical history is significant for aneurysm.  Pertinent negatives for past medical history include no seizures.    Palpitations  Associated symptoms include chest pain. Pertinent negatives include no abdominal pain, no headaches, no back pain and no cough.    Past Medical History  Diagnosis Date  . Ear infection   . Pneumonia     Past Surgical History  Procedure Date  . Bladder surgery   . Mastoid tumor removed     No family history on file.  History  Substance Use Topics  . Smoking status: Never Smoker   . Smokeless tobacco: Not on file  . Alcohol Use: No      Review of Systems  Constitutional: Negative for fatigue.  HENT: Negative for  congestion, sinus pressure and ear discharge.   Eyes: Negative for discharge.  Respiratory: Negative for cough.   Cardiovascular: Positive for chest pain and palpitations.  Gastrointestinal: Negative for abdominal pain and diarrhea.  Genitourinary: Negative for frequency and hematuria.  Musculoskeletal: Negative for back pain.  Skin: Negative for rash.  Neurological: Negative for seizures and headaches.  Hematological: Negative.   Psychiatric/Behavioral: Negative for hallucinations.    Allergies  Codeine  Home Medications   Current Outpatient Rx  Name Route Sig Dispense Refill  . ACETAMINOPHEN 500 MG PO TABS Oral Take 1,000 mg by mouth every 6 (six) hours as needed. For pain    . AMOXICILLIN 875 MG PO TABS Oral Take 875 mg by mouth 2 (two) times daily. 7 tablets remaining    . BUDESONIDE-FORMOTEROL FUMARATE 80-4.5 MCG/ACT IN AERO Inhalation Inhale 2 puffs into the lungs 2 (two) times daily.    Marland Kitchen DIPHENHYDRAMINE HCL (SLEEP) 25 MG PO TABS Oral Take 25 mg by mouth at bedtime.    Marland Kitchen FEXOFENADINE HCL 180 MG PO TABS Oral Take 180 mg by mouth daily.    Marland Kitchen FLUTICASONE PROPIONATE 50 MCG/ACT NA SUSP Nasal Place 2 sprays into the nose 2 (two) times daily.    Marland Kitchen HYDROCHLOROTHIAZIDE 12.5 MG PO CAPS Oral Take 12.5 mg by mouth every morning.    . ADULT MULTIVITAMIN W/MINERALS CH Oral Take 1 tablet by mouth daily. Generic centrum      BP 124/73  Temp(Src)  98.4 F (36.9 C) (Oral)  Resp 18  SpO2 96%  Physical Exam  Constitutional: He is oriented to person, place, and time. He appears well-developed.  HENT:  Head: Normocephalic and atraumatic.  Eyes: Conjunctivae and EOM are normal. No scleral icterus.  Neck: Neck supple. No thyromegaly present.  Cardiovascular: Normal rate and regular rhythm.  Exam reveals no gallop and no friction rub.   No murmur heard. Pulmonary/Chest: No stridor. He has no wheezes. He has no rales. He exhibits no tenderness.  Abdominal: He exhibits no distension. There is  no tenderness. There is no rebound.  Musculoskeletal: Normal range of motion. He exhibits no edema.  Lymphadenopathy:    He has no cervical adenopathy.  Neurological: He is oriented to person, place, and time. Coordination normal.  Skin: No rash noted. No erythema.  Psychiatric: He has a normal mood and affect. His behavior is normal.    ED Course  Procedures (including critical care time)  Labs Reviewed  COMPREHENSIVE METABOLIC PANEL - Abnormal; Notable for the following:    Glucose, Bld 127 (*)    GFR calc non Af Amer 65 (*)    GFR calc Af Amer 76 (*)    All other components within normal limits  CBC  TROPONIN I  PROTIME-INR  CBC  DIFFERENTIAL   Dg Chest 2 View  07/31/2011  *RADIOLOGY REPORT*  Clinical Data: Chest pain shortness of breath  CHEST - 2 VIEW  Comparison: Chest radiograph 08/04/2009  Findings: Heart size is within normal limits.  Thoracic aorta contour is normal and stable.  Rounded contour in the mid upper abdomen/lower mediastinum is favored to be a moderate hiatal hernia contour and is stable.  The lungs are clear.  Negative for pleural effusion.  The bones are unremarkable.  IMPRESSION:  1.  No acute cardiopulmonary disease. 2.  Suspect moderate hiatal hernia, stable.  Original Report Authenticated By: Britta Mccreedy, M.D.     1. Chest pain      Date: 07/31/2011  Rate: 81  Rhythm: normal sinus rhythm  QRS Axis: normal  Intervals: normal  ST/T Wave abnormalities: nonspecific ST changes  Conduction Disutrbances:none  Narrative Interpretation:   Old EKG Reviewed: none available    MDM          Benny Lennert, MD 07/31/11 1655

## 2011-07-31 NOTE — H&P (Signed)
PCP:   Delorse Lek, MD, MD   Chief Complaint:  Dizziness and chest discomfort  HPI: Patient is a 77 year old white male with past medical history of a recurrent sinus/bronchitis infection that has been going on for the past 4 months who has noted for the last 5 days this had episodes of what he describes as a fluttering sensation in his chest. This may be more winded and he did he see dyspnea on exertion. He did not think too much of it until today when he started having he described as a chest tightness. This would come and go and described as a midsternal with a little bit of radiation to the left side vice-like feeling. He described as uncomfortable as a 5 or 6/10. He came in the emergency room for further evaluation.  As mentioned above, the patient has had 4 months of a respiratory type infection. 4 months ago, the patient was seen with significant wheezing as well as some drainage and cough. He was eventually put on prednisone as well as Augmentin for 3 weeks. The wheezing improved over a very slow steroid taper, but the sinus drainage and occasional cough did not. After the Augmentin failed, the patient was then put on doxycycline. He is on this for 2 weeks and was evaluated and found to initially have bronchitis and then later thought to be pneumonia. This was treated with 2 sequential one week doses of Avelox. According to the patient, the pneumonia was treated but he continued to have problems with sinus drainage and was referred to ENT. Patient saw the ENT doctor and was just started a few days ago on a course of amoxicillin. Patient had been feeling rough and it noted that his blood pressure was trending up into the 160s and previously had been normal, she went to go see his primary care provider. This doctor started him on some hydrochlorothiazide one day prior which he took a dose. Which helped his blood pressure a little bit.  In the emergency room, the patient was evaluated. His lab  work is unremarkable. Chest x-ray was unremarkable. His EKG did note a left posterior fascicular block of unknown origin. No previous EKG to compare to.  When I saw the patient emergency room, he was feeling okay. He continued to have occasional episodes of fluttering in his chest but no real chest tightness. No shortness of breath. His other big complaint was of feeling lightheaded especially with the heart fluttering. When he was lying down he did not have too many problems but is sitting up he inserted feel lightheaded. This is even more profound when he stood. Interestingly the longer that he sat up or that he stood this seemed to make his lightheadedness even more so to the point that he was going to either be sick or pass out. Is also accompanied by shortness of breath and fluttering in his chest.  Review of Systems:  I saw the patient emergency room, he was doing okay. His chest pain like symptoms have resolved. He denies any headaches or vision changes or dysphagia. He does complain of significant fluttering/palpitations. He does so short of breath with this and occasionally has had a cough because it is more related to his sinus issues. He has not had any wheezing since he started approximately 4 months ago and was treated with steroids. He denies any abdominal pain, hematuria, dysuria, constipation, diarrhea, focal steady numbness weakness or pain. Review systems otherwise negative.  Past Medical History: Past Medical  History  Diagnosis Date  . Ear infection   . Pneumonia    severe acid reflux History bladder cancer Elevated blood pressure readings Chronic sinusitis.  Past Surgical History  Procedure Date  . Bladder surgery   . Mastoid tumor removed     Medications: Prior to Admission medications   Medication Sig Start Date End Date Taking? Authorizing Provider  acetaminophen (TYLENOL) 500 MG tablet Take 1,000 mg by mouth every 6 (six) hours as needed. For pain   Yes Historical  Provider, MD  amoxicillin (AMOXIL) 875 MG tablet Take 875 mg by mouth 2 (two) times daily. 7 tablets remaining   Yes Historical Provider, MD  budesonide-formoterol (SYMBICORT) 80-4.5 MCG/ACT inhaler Inhale 2 puffs into the lungs 2 (two) times daily.   Yes Historical Provider, MD  diphenhydrAMINE (SOMINEX) 25 MG tablet Take 25 mg by mouth at bedtime.   Yes Historical Provider, MD  fexofenadine (ALLEGRA) 180 MG tablet Take 180 mg by mouth daily.   Yes Historical Provider, MD  fluticasone (FLONASE) 50 MCG/ACT nasal spray Place 2 sprays into the nose 2 (two) times daily.   Yes Historical Provider, MD  hydrochlorothiazide (MICROZIDE) 12.5 MG capsule Take 12.5 mg by mouth every morning.   Yes Historical Provider, MD  Multiple Vitamin (MULITIVITAMIN WITH MINERALS) TABS Take 1 tablet by mouth daily. Generic centrum   Yes Historical Provider, MD    Allergies:   Allergies  Allergen Reactions  . Codeine Other (See Comments)    Severe headache, nausea and vomiting    Social History:  reports that he has never smoked. He does not have any smokeless tobacco history on file. He reports that he does not drink alcohol or use illicit drugs. The patient is normally at baseline able to participate in full activities of daily living without any assistance prior to this fluttering episodes and new dyspnea on exertion. Now his activity level is significantly limited. He lives at home with his wife.  Family History: Patient states that no significant medical problems run in his family.  Physical Exam: Filed Vitals:   07/31/11 1530 07/31/11 1600 07/31/11 1715 07/31/11 1825  BP: 124/73 133/90 151/87 146/77  Pulse:  69 85 91  Temp:    98.1 F (36.7 C)  TempSrc:    Oral  Resp: 18 17 16 20   Height:    5\' 11"  (1.803 m)  Weight:    89.676 kg (197 lb 11.2 oz)  SpO2: 96% 95% 98% 97%   orthostatics: Initially the patient was not very orthostatic and going from a lying down position to a sitting up position. This  only resulted in some feeling of lightheadedness. However when I went from a lying down position to a standing position, this is more profound. Lying down: Blood pressure 173/79 with heart rate is 76 Standing up (and waiting a full 2 minutes) led to blood pressure of 151/87 with heart rate of 87 and significant lightheadedness. HEENT: Normocephalic, atraumatic coming extremity slightly dry Cardiovascular: Regular rate and rhythm, S1-S2, occasional ectopic beat Lungs: Clear to auscultation Bilaterally Abdomen: Soft, nontender, nondistended, positive bowel sounds Extremities: Clubbing or cyanosis or edema.   Labs on Admission:   [facility] 07/31/11 1436  NA 137  K 3.8  CL 103  CO2 22  GLUCOSE 127*  BUN 11  CREATININE 1.08  CALCIUM 9.7  MG --  PHOS --    Basename 07/31/11 1436  AST 19  ALT 16  ALKPHOS 61  BILITOT 0.6  PROT 7.1  ALBUMIN 3.9    Basename 07/31/11 1436  WBC 7.1  NEUTROABS --  HGB 16.3  HCT 46.1  MCV 91.7  PLT 158    Basename 07/31/11 1436  CKTOTAL --  CKMB --  CKMBINDEX --  TROPONINI <0.30   Radiological Exams on Admission:  Dg Chest 2 View 07/31/2011  * IMPRESSION:  1.  No acute cardiopulmonary disease. 2.  Suspect moderate hiatal hernia, stable.  Original Report Authenticated By: Britta Mccreedy, M.D.    Assessment/Plan Present on Admission:  .Sinusitis-chronic. Patient seems to have severe problems with a sinus infection and has been on antibiotics for over 8 weeks. I'm concerned both about C. difficile exposure as well as why he did not seem to be getting better. He has been evaluated by ear nose and throat although he's not had a CT scan. Rather than try to manage this while he is here for only a day or 2, will recommend close followup with ENT. See below in regards to his inhalers which may be causing some problems as well.  .Precordial chest pain: Principal problem. Will place and observation. Check cardiac markers x3. Given his EKG noting  posterior fascicular block and a previous history of CAD, we'll ask cardiology for stress test. Overall however, I suspect his chest pressure may be from bronchospasm from continuous sinus drainage but will wait to clear his heart first.  .Orthostatic hypotension: Going from sitting to standing, the patient did drop 22 points systolic although his heart rate only moderately increased. Not sure if this is true orthostasis. Nevertheless we'll hold his diuretic and gently hydrate. Admitted because of his respiratory issues, he may been having problems with some significant dehydration made worse with starting him on a diuretic. Recommending long-term he discontinued this and look at alternative options for his blood pressure.  Marland KitchenHTN (hypertension): Discontinue his diuretic and gently hydrating. We'll recheck his blood pressure during his hospitalization and start him on alternative medication if indicated.  .Dizziness: This is in part due to orthostasis and perhaps also symptomatic PVCs. Gently hydrating. Recheck orthostatics in the morning.  .Symptomatic PVCs: Patient is on a number of medications that could be contributing to this. I'm going to discontinue his Symbicort altogether as well as his HCTZ. In addition other medications including his Sominex and Allegra may also be playing a role.  After discussion with the patient, he is to be a full code.  We will respect these wishes.  I anticipate his length of stay to be 2 days based on exam, labs, history and the fact that he needs a stress test which likely cannot be done until Monday..  Time spent on this patient including examination and decision-making process: 55 minutes.  Hollice Espy [phone] 07/31/2011, 7:43 PM

## 2011-08-01 ENCOUNTER — Observation Stay (HOSPITAL_COMMUNITY): Payer: Medicare Other

## 2011-08-01 ENCOUNTER — Encounter (HOSPITAL_COMMUNITY): Payer: Self-pay | Admitting: Physician Assistant

## 2011-08-01 DIAGNOSIS — R079 Chest pain, unspecified: Secondary | ICD-10-CM

## 2011-08-01 LAB — CARDIAC PANEL(CRET KIN+CKTOT+MB+TROPI)
CK, MB: 2.1 ng/mL (ref 0.3–4.0)
CK, MB: 2.1 ng/mL (ref 0.3–4.0)
Relative Index: INVALID (ref 0.0–2.5)
Relative Index: INVALID (ref 0.0–2.5)
Total CK: 49 U/L (ref 7–232)
Total CK: 50 U/L (ref 7–232)
Troponin I: <0.3 ng/mL (ref <0.30)
Troponin I: <0.3 ng/mL (ref <0.30)

## 2011-08-01 LAB — CBC
HCT: 42 % (ref 39.0–52.0)
Hemoglobin: 14.8 g/dL (ref 13.0–17.0)
MCH: 32.1 pg (ref 26.0–34.0)
MCHC: 35.2 g/dL (ref 30.0–36.0)
MCV: 91.1 fL (ref 78.0–100.0)
Platelets: 137 10*3/uL — ABNORMAL LOW (ref 150–400)
RBC: 4.61 MIL/uL (ref 4.22–5.81)
RDW: 14.5 % (ref 11.5–15.5)
WBC: 7.3 10*3/uL (ref 4.0–10.5)

## 2011-08-01 LAB — BASIC METABOLIC PANEL
BUN: 12 mg/dL (ref 6–23)
CO2: 25 mEq/L (ref 19–32)
Calcium: 9.3 mg/dL (ref 8.4–10.5)
Chloride: 104 mEq/L (ref 96–112)
Creatinine, Ser: 1.1 mg/dL (ref 0.50–1.35)
GFR calc Af Amer: 74 mL/min — ABNORMAL LOW (ref >90)
GFR calc non Af Amer: 64 mL/min — ABNORMAL LOW (ref >90)
Glucose, Bld: 95 mg/dL (ref 70–99)
Potassium: 3.4 mEq/L — ABNORMAL LOW (ref 3.5–5.1)
Sodium: 139 mEq/L (ref 135–145)

## 2011-08-01 LAB — TSH: TSH: 2.382 u[IU]/mL (ref 0.350–4.500)

## 2011-08-01 MED ORDER — DIPHENHYDRAMINE HCL (SLEEP) 25 MG PO TABS: 25.0000 mg | ORAL_TABLET | Freq: Every evening | ORAL | Status: DC | PRN

## 2011-08-01 MED ORDER — REGADENOSON 0.4 MG/5ML IV SOLN
0.4000 mg | Freq: Once | INTRAVENOUS | Status: AC
  Administered 2011-08-01: 0.4 mg via INTRAVENOUS
  Filled 2011-08-01: qty 5

## 2011-08-01 MED ORDER — TECHNETIUM TC 99M TETROFOSMIN IV KIT
30.0000 | PACK | Freq: Once | INTRAVENOUS | Status: AC | PRN
  Administered 2011-08-01: 30 via INTRAVENOUS

## 2011-08-01 MED ORDER — POTASSIUM CHLORIDE CRYS ER 20 MEQ PO TBCR
40.0000 meq | EXTENDED_RELEASE_TABLET | Freq: Once | ORAL | Status: AC
  Administered 2011-08-01: 40 meq via ORAL
  Filled 2011-08-01 (×2): qty 2

## 2011-08-01 MED ORDER — TECHNETIUM TC 99M TETROFOSMIN IV KIT
10.0000 | PACK | Freq: Once | INTRAVENOUS | Status: AC | PRN
  Administered 2011-08-01: 10 via INTRAVENOUS

## 2011-08-01 NOTE — Discharge Summary (Signed)
PATIENT DETAILS Name: Ronald Russell Age: 77 y.o. Sex: male Date of Birth: November 28, 1934 MRN: [id number]. Admit Date: 07/31/2011 Admitting Physician: Hollice Espy, MD BJY:[id number],ZHYQM A, MD, MD  PRIMARY DISCHARGE DIAGNOSIS:  Principal Problem:  *Precordial chest pain Active Problems:  Sinusitis  Orthostatic hypotension  HTN (hypertension)  Dizziness  Symptomatic PVCs      PAST MEDICAL HISTORY: Past Medical History  Diagnosis Date  . Ear infection   . Pneumonia   . Bladder cancer     DISCHARGE MEDICATIONS: Medication List  As of 08/01/2011  3:15 PM   STOP taking these medications         hydrochlorothiazide 12.5 MG capsule         TAKE these medications         acetaminophen 500 MG tablet   Commonly known as: TYLENOL   Take 1,000 mg by mouth every 6 (six) hours as needed. For pain      amoxicillin 875 MG tablet   Commonly known as: AMOXIL   Take 875 mg by mouth 2 (two) times daily. 7 tablets remaining      budesonide-formoterol 80-4.5 MCG/ACT inhaler   Commonly known as: SYMBICORT   Inhale 2 puffs into the lungs 2 (two) times daily.      diphenhydrAMINE 25 MG tablet   Commonly known as: SOMINEX   Take 1 tablet (25 mg total) by mouth at bedtime as needed for allergies or sleep.      fexofenadine 180 MG tablet   Commonly known as: ALLEGRA   Take 180 mg by mouth daily.      fluticasone 50 MCG/ACT nasal spray   Commonly known as: FLONASE   Place 2 sprays into the nose 2 (two) times daily.      mulitivitamin with minerals Tabs   Take 1 tablet by mouth daily. Generic centrum             BRIEF HPI:  See H&P, Labs, Consult and Test reports for all details in brief, patient was admitted for chest pain and dizziness, for further details please see the H&P done during admission  CONSULTATIONS:   cardiology  PERTINENT RADIOLOGIC STUDIES: Dg Chest 2 View  07/31/2011  *RADIOLOGY REPORT*  Clinical Data: Chest pain shortness of breath  CHEST - 2 VIEW   Comparison: Chest radiograph 08/04/2009  Findings: Heart size is within normal limits.  Thoracic aorta contour is normal and stable.  Rounded contour in the mid upper abdomen/lower mediastinum is favored to be a moderate hiatal hernia contour and is stable.  The lungs are clear.  Negative for pleural effusion.  The bones are unremarkable.  IMPRESSION:  1.  No acute cardiopulmonary disease. 2.  Suspect moderate hiatal hernia, stable.  Original Report Authenticated By: Britta Mccreedy, M.D.   Nm Myocar Multi W/spect W/wall Motion / Ef  08/01/2011  *RADIOLOGY REPORT*  Clinical Data:  Chest pain.  Hypertension.  Palpitations.  MYOCARDIAL IMAGING WITH SPECT (REST AND PHARMACOLOGIC-STRESS) GATED LEFT VENTRICULAR WALL MOTION STUDY LEFT VENTRICULAR EJECTION FRACTION  Standard myocardial SPECT imaging was performed after resting intravenous injection of 10. Tc-50m tetrofosmin.  Subsequently, intravenous infusion of Lexiscan  was performed under the supervision of cardiology staff.  At peak effect of the drug, 30. of Tc-41m tetrofosmin was injected intravenously and standard myocardial SPECT imaging was performed.  Quantitative gated imaging was also performed to evaluate left ventricular wall motion and estimated left ventricular ejection fraction.  Comparison:  Plain film of 07/31/2011.  Findings:  Rest images demonstrate no fixed defects.  Normal left ventricular cavity size.  Stress images demonstrate no areas of reversibility to suggest inducible ischemia.  Evaluation of wall motion demonstrates normal left ventricular wall motion and thickening.  Ejection fraction is estimated at 79%.  End diastolic volume of 49 cc.  End systolic volume of  10 cc.  IMPRESSION: 1.  No fixed or reversible defects to suggest inducible ischemia. 2.  Normal left ventricular wall motion and thickening. 3.  Normal ejection fraction.  Original Report Authenticated By: Consuello Bossier, M.D.     PERTINENT LAB RESULTS: CBC:  Basename  08/01/11 0229 07/31/11 2000  WBC 7.3 7.7  HGB 14.8 15.5  HCT 42.0 43.9  PLT 137* 152   CMET CMP     Component Value Date/Time   NA 139 08/01/2011 0229   K 3.4* 08/01/2011 0229   CL 104 08/01/2011 0229   CO2 25 08/01/2011 0229   GLUCOSE 95 08/01/2011 0229   BUN 12 08/01/2011 0229   CREATININE 1.10 08/01/2011 0229   CALCIUM 9.3 08/01/2011 0229   PROT 7.1 07/31/2011 1436   ALBUMIN 3.9 07/31/2011 1436   AST 19 07/31/2011 1436   ALT 16 07/31/2011 1436   ALKPHOS 61 07/31/2011 1436   BILITOT 0.6 07/31/2011 1436   GFRNONAA 64* 08/01/2011 0229   GFRAA 74* 08/01/2011 0229    GFR Estimated Creatinine Clearance: 61.8 ml/min (by C-G formula based on Cr of 1.1). No results found for this basename: LIPASE:2,AMYLASE:2 in the last 72 hours  Basename 08/01/11 1209 08/01/11 0229 07/31/11 2000  CKTOTAL 50 49 54  CKMB 2.1 2.1 2.2  CKMBINDEX -- -- --  TROPONINI <0.30 <0.30 <0.30   No components found with this basename: POCBNP:3 No results found for this basename: DDIMER:2 in the last 72 hours No results found for this basename: HGBA1C:2 in the last 72 hours No results found for this basename: CHOL:2,HDL:2,LDLCALC:2,TRIG:2,CHOLHDL:2,LDLDIRECT:2 in the last 72 hours  Basename 07/31/11 2000  TSH 2.382  T4TOTAL --  T3FREE --  THYROIDAB --   No results found for this basename: VITAMINB12:2,FOLATE:2,FERRITIN:2,TIBC:2,IRON:2,RETICCTPCT:2 in the last 72 hours Coags:  Basename 07/31/11 1436  INR 0.91   Microbiology: No results found for this or any previous visit (from the past 240 hour(s)).   BRIEF HOSPITAL COURSE:   Principal Problem:  *Precordial chest pain Likely atypical Currently resolved Lexiscan was done this admission-negative for ischemia  Active Problems:  Sinusitis -continue with amoxicillin and flonase -keep next appointment with ENT   Orthostatic hypotension -likely secondary to HCTZ, was gently hydrated, currently no symptoms upon getting up and ambulating -will d/c HCTZ,  patient to follow up with Primary MD in 1 week, to see if he needs antihypertensives, if so perhaps low dose beta blockers may need to be added, as he does have some PAC's/Palpitations  Palpitations -telemetry negative for any significant arrythmias -patient to follow up with [facility]-for event monitor and 2 D Echo -may need low dose of Beta Blocker needs to be added if BP tolerates (orthostatic on admission)   HTN (hypertension) -Discontinue HCTZ -evaluate need for further medications with Primary MD-on next visit-have asked patient to follow up in 1 week  TODAY-DAY OF DISCHARGE:  Subjective:   Ronald Russell today has no headache,no chest abdominal pain,no new weakness tingling or numbness, feels much better wants to go home today.   Objective:   Blood pressure 134/78, pulse 92, temperature 97.7 F (36.5 C), temperature source Oral, resp. rate 16, height  5\' 11"  (1.803 m), weight 89.676 kg (197 lb 11.2 oz), SpO2 96.00%.  Intake/Output Summary (Last 24 hours) at 08/01/11 1515 Last data filed at 08/01/11 1200  Gross per 24 hour  Intake      0 ml  Output    775 ml  Net   -775 ml    Exam Awake Alert, Oriented *3, No new F.N deficits, Normal affect [place].AT,PERRAL Supple Neck,No JVD, No cervical lymphadenopathy appriciated.  Symmetrical Chest wall movement, Good air movement bilaterally, CTAB RRR,No Gallops,Rubs or new Murmurs, No Parasternal Heave +ve B.Sounds, Abd Soft, Non tender, No organomegaly appriciated, No rebound -guarding or rigidity. No Cyanosis, Clubbing or edema, No new Rash or bruise  DISPOSITION: Home   DISCHARGE INSTRUCTIONS:    Follow-up Information    Follow up with [facility]. (Our office will call you to arrange echocardiogram and heart monitor)    Contact information:   [address] [place] [address]       Follow up with Delorse Lek, MD. Schedule an appointment as soon as possible for a visit in 1  week.   Contact information:   [address] [place] [address] [phone]       Follow up with Drema Halon, MD. (keep next appointment)    Contact information:   Individual [address] [place] [address] [phone]           Total Time spent on discharge equals 45 minutes.  SignedJeoffrey Massed 08/01/2011 3:15 PM
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
## 2011-08-01 NOTE — Progress Notes (Signed)
I left a message on our scheduler's voicemail to set up outpatient 2D echo and event monitor. Our office will be calling him this week to arrange a time to have these studies set up. In the meantime if he has any problems, can call our office at [phone].  Brande Uncapher PA-C

## 2011-08-09 ENCOUNTER — Other Ambulatory Visit (HOSPITAL_COMMUNITY): Payer: Self-pay | Admitting: Cardiology

## 2011-08-10 ENCOUNTER — Other Ambulatory Visit (HOSPITAL_COMMUNITY): Payer: Medicare Other

## 2011-08-16 ENCOUNTER — Institutional Professional Consult (permissible substitution): Payer: Medicare Other | Admitting: Cardiology

## 2011-08-19 ENCOUNTER — Other Ambulatory Visit: Payer: Self-pay

## 2011-08-19 ENCOUNTER — Ambulatory Visit (INDEPENDENT_AMBULATORY_CARE_PROVIDER_SITE_OTHER): Payer: Medicare Other | Admitting: Cardiology

## 2011-08-19 ENCOUNTER — Encounter: Payer: Self-pay | Admitting: Cardiology

## 2011-08-19 ENCOUNTER — Ambulatory Visit (HOSPITAL_COMMUNITY): Payer: Medicare Other | Attending: Cardiology

## 2011-08-19 DIAGNOSIS — I10 Essential (primary) hypertension: Secondary | ICD-10-CM

## 2011-08-19 DIAGNOSIS — R072 Precordial pain: Secondary | ICD-10-CM

## 2011-08-19 DIAGNOSIS — I4949 Other premature depolarization: Secondary | ICD-10-CM | POA: Insufficient documentation

## 2011-08-19 DIAGNOSIS — I493 Ventricular premature depolarization: Secondary | ICD-10-CM

## 2011-08-19 DIAGNOSIS — I951 Orthostatic hypotension: Secondary | ICD-10-CM | POA: Insufficient documentation

## 2011-08-19 DIAGNOSIS — R42 Dizziness and giddiness: Secondary | ICD-10-CM | POA: Insufficient documentation

## 2011-08-19 DIAGNOSIS — R079 Chest pain, unspecified: Secondary | ICD-10-CM | POA: Insufficient documentation

## 2011-08-19 NOTE — Assessment & Plan Note (Signed)
His blood pressure is high today but I reviewed an extensive detailed blood pressure diary. Based on this no change in therapy is indicated. However, he'll keep an eye on this and if he transfers to an average about 140/90 therapy could be initiated by his primary provider.

## 2011-08-19 NOTE — Patient Instructions (Signed)
The current medical regimen is effective;  continue present plan and medications.  You will be called with the results of your monitor once it is available.  Follow up as needed.

## 2011-08-19 NOTE — Assessment & Plan Note (Signed)
He's no longer having palpitations. He had no ectopy on telemetry in the hospital. No further workup is suggested.

## 2011-08-19 NOTE — Assessment & Plan Note (Signed)
This has been atypical. There is no apparent cardiac etiology. Negative stress test. No further cardiac workup is suggested.

## 2011-08-19 NOTE — Assessment & Plan Note (Signed)
I don't see a cardiac etiology to this. At this point no change in therapy is indicated.  I will followup the results of the echocardiogram. However, I don't suspect any further cardiac evaluation will be indicated.

## 2011-08-19 NOTE — Progress Notes (Signed)
HPI The patient was added to my schedule today because of dizziness while he was getting an echo. I have reviewed her recent hospitalization. He was admitted with dyspnea and possible pneumonia. He was seen in consultation because of chest pain and palpitations. Telemetry demonstrated no arrhythmias. Stress perfusion imaging was negative. He was to have an outpatient monitor. He was here today for an echocardiogram.  He felt quite lightheaded standing up after the test. He was not orthostatic. He has had this problem in the past however. He has not had any new chest discomfort, neck or arm discomfort. He has had no new shortness of breath, PND or orthopnea. He actually is not describing palpitations at this point. He has had significant problems with sinus issues. He's been sensitive to multiple medications. He did take Mucinex last night and this morning. He.this might have been contributing to his symptoms.  Allergies  Allergen Reactions  . Codeine Other (See Comments)    Severe headache, nausea and vomiting    Current Outpatient Prescriptions  Medication Sig Dispense Refill  . dextromethorphan-guaiFENesin (MUCINEX DM) 30-600 MG per 12 hr tablet Take 1 tablet by mouth as needed.      . fexofenadine (ALLEGRA) 180 MG tablet Take 180 mg by mouth daily.      . Multiple Vitamin (MULITIVITAMIN WITH MINERALS) TABS Take 1 tablet by mouth daily. Generic centrum      . sildenafil (VIAGRA) 100 MG tablet Take 25 mg by mouth as needed.      . fluticasone (FLONASE) 50 MCG/ACT nasal spray Place 2 sprays into the nose 2 (two) times daily.        Past Medical History  Diagnosis Date  . Ear infection   . Pneumonia   . Bladder cancer     Past Surgical History  Procedure Date  . Bladder surgery   . Mastoid tumor removed     No family history on file.  History   Social History  . Marital Status: Married    Spouse Name: N/A    Number of Children: N/A  . Years of Education: N/A   Occupational  History  . Not on file.   Social History Main Topics  . Smoking status: Never Smoker   . Smokeless tobacco: Not on file  . Alcohol Use: No  . Drug Use: No  . Sexually Active: Yes   Other Topics Concern  . Not on file   Social History Narrative  . No narrative on file   ROS:  As stated in the HPI and negative for all other systems.  PHYSICAL EXAM BP 166/99  Pulse 73  Ht 5' 11.5" (1.816 m) GENERAL:  Well appearing HEENT:  Pupils equal round and reactive, fundi not visualized, oral mucosa unremarkable NECK:  No jugular venous distention, waveform within normal limits, carotid upstroke brisk and symmetric, no bruits, no thyromegaly LYMPHATICS:  No cervical, inguinal adenopathy LUNGS:  Clear to auscultation bilaterally BACK:  No CVA tenderness CHEST:  Unremarkable HEART:  PMI not displaced or sustained,S1 and S2 within normal limits, no S3, no S4, no clicks, no rubs, no murmurs ABD:  Flat, positive bowel sounds normal in frequency in pitch, no bruits, no rebound, no guarding, no midline pulsatile mass, no hepatomegaly, no splenomegaly EXT:  2 plus pulses throughout, no edema, no cyanosis no clubbing SKIN:  No rashes no nodules NEURO:  Cranial nerves II through XII grossly intact, motor grossly intact throughout PSYCH:  Cognitively intact, oriented to person place  and time'   ASSESSMENT AND PLAN

## 2011-08-20 ENCOUNTER — Telehealth: Payer: Self-pay | Admitting: Cardiology

## 2011-08-20 NOTE — Telephone Encounter (Signed)
Fu call °Patient returning your call °

## 2011-08-20 NOTE — Telephone Encounter (Signed)
Error

## 2011-08-20 NOTE — Telephone Encounter (Signed)
Returned pts call to review 2 D Echo results.  Left message to call back.

## 2011-08-20 NOTE — Telephone Encounter (Signed)
Will forward to Pam

## 2011-08-23 NOTE — Telephone Encounter (Signed)
Echo results were given to pt. 

## 2011-08-23 NOTE — Telephone Encounter (Signed)
F/U  Patient returning nurse call concerning Echo results. He Can be reached at hm# [phone]

## 2011-08-27 ENCOUNTER — Telehealth: Payer: Self-pay | Admitting: Cardiology

## 2011-08-27 NOTE — Telephone Encounter (Signed)
LOV,12,Echo faxed to [facility] @  [place] [phone] 08/27/11/KM

## 2011-08-31 ENCOUNTER — Emergency Department (HOSPITAL_COMMUNITY): Payer: Medicare Other

## 2011-08-31 ENCOUNTER — Emergency Department (HOSPITAL_COMMUNITY)
Admission: EM | Admit: 2011-08-31 | Discharge: 2011-08-31 | Disposition: A | Discharge status: Home / Self Care | Payer: Medicare Other | Attending: Emergency Medicine | Admitting: Emergency Medicine

## 2011-08-31 ENCOUNTER — Encounter (HOSPITAL_COMMUNITY): Payer: Self-pay | Admitting: *Deleted

## 2011-08-31 ENCOUNTER — Other Ambulatory Visit: Payer: Self-pay

## 2011-08-31 DIAGNOSIS — Z79899 Other long term (current) drug therapy: Secondary | ICD-10-CM | POA: Insufficient documentation

## 2011-08-31 DIAGNOSIS — Z8551 Personal history of malignant neoplasm of bladder: Secondary | ICD-10-CM | POA: Insufficient documentation

## 2011-08-31 DIAGNOSIS — R110 Nausea: Secondary | ICD-10-CM | POA: Insufficient documentation

## 2011-08-31 DIAGNOSIS — R209 Unspecified disturbances of skin sensation: Secondary | ICD-10-CM | POA: Insufficient documentation

## 2011-08-31 DIAGNOSIS — R079 Chest pain, unspecified: Secondary | ICD-10-CM | POA: Insufficient documentation

## 2011-08-31 DIAGNOSIS — R42 Dizziness and giddiness: Secondary | ICD-10-CM | POA: Insufficient documentation

## 2011-08-31 DIAGNOSIS — R55 Syncope and collapse: Secondary | ICD-10-CM | POA: Insufficient documentation

## 2011-08-31 LAB — BASIC METABOLIC PANEL
BUN: 10 mg/dL (ref 6–23)
CO2: 25 mEq/L (ref 19–32)
Calcium: 9.1 mg/dL (ref 8.4–10.5)
Chloride: 105 mEq/L (ref 96–112)
Creatinine, Ser: 1.13 mg/dL (ref 0.50–1.35)
GFR calc Af Amer: 71 mL/min — ABNORMAL LOW (ref >90)
GFR calc non Af Amer: 62 mL/min — ABNORMAL LOW (ref >90)
Glucose, Bld: 98 mg/dL (ref 70–99)
Potassium: 4 mEq/L (ref 3.5–5.1)
Sodium: 139 mEq/L (ref 135–145)

## 2011-08-31 LAB — DIFFERENTIAL
Basophils Absolute: 0 10*3/uL (ref 0.0–0.1)
Basophils Relative: 0 % (ref 0–1)
Eosinophils Absolute: 0.1 10*3/uL (ref 0.0–0.7)
Eosinophils Relative: 1 % (ref 0–5)
Lymphocytes Relative: 13 % (ref 12–46)
Lymphs Abs: 1.3 10*3/uL (ref 0.7–4.0)
Monocytes Absolute: 0.7 10*3/uL (ref 0.1–1.0)
Monocytes Relative: 7 % (ref 3–12)
Neutro Abs: 8.1 10*3/uL — ABNORMAL HIGH (ref 1.7–7.7)
Neutrophils Relative %: 79 % — ABNORMAL HIGH (ref 43–77)

## 2011-08-31 LAB — URINALYSIS, ROUTINE W REFLEX MICROSCOPIC
Bilirubin Urine: NEGATIVE
Glucose, UA: NEGATIVE mg/dL
Hgb urine dipstick: NEGATIVE
Ketones, ur: NEGATIVE mg/dL
Leukocytes, UA: NEGATIVE
Nitrite: NEGATIVE
Protein, ur: NEGATIVE mg/dL
Specific Gravity, Urine: 1.012 (ref 1.005–1.030)
Urobilinogen, UA: 0.2 mg/dL (ref 0.0–1.0)
pH: 6.5 (ref 5.0–8.0)

## 2011-08-31 LAB — CBC
HCT: 46 % (ref 39.0–52.0)
Hemoglobin: 16.6 g/dL (ref 13.0–17.0)
MCH: 33.1 pg (ref 26.0–34.0)
MCHC: 36.1 g/dL — ABNORMAL HIGH (ref 30.0–36.0)
MCV: 91.8 fL (ref 78.0–100.0)
Platelets: 130 10*3/uL — ABNORMAL LOW (ref 150–400)
RBC: 5.01 MIL/uL (ref 4.22–5.81)
RDW: 13.9 % (ref 11.5–15.5)
WBC: 10.3 10*3/uL (ref 4.0–10.5)

## 2011-08-31 LAB — TROPONIN I: Troponin I: <0.3 ng/mL (ref <0.30)

## 2011-08-31 NOTE — ED Notes (Signed)
Per EMS pt had a syncopal episode at home. State that he continues to have lightheadedness. State that he has tingling in the left side of his head. Pt had Nausea as well. Pt has recently had bronchis and pneumonia.

## 2011-08-31 NOTE — ED Notes (Signed)
PA at bedside.

## 2011-08-31 NOTE — Discharge Instructions (Signed)
Follow up with your family doctor for further evaluation of lightheadedness.  He/she may wish for you to wear a holter monitor to check for heart rhythm changes in the setting of lightheadedness.  You will also need to have your blood pressure rechecked.  Drink plenty of fluids at home.   You may return to the ER if symptoms worsen or you have any other concerns.

## 2011-08-31 NOTE — ED Provider Notes (Signed)
History     CSN: [id number]  Arrival date & time 08/31/11  1212   First MD Initiated Contact with Patient 08/31/11 1222      Chief Complaint  Patient presents with  . Near Syncope    (Consider location/radiation/quality/duration/timing/severity/associated sxs/prior treatment) HPI History provided by pt.   Pt developed lightheadedness while having a bowel movement today.  Laid down in his immediately after and lightheadedness improved.  Checked his blood pressure 4-5 times and became increasingly more hypertensive.  Then he sat up and became lightheaded again, this time w/ associated tingling in the back of this head and seeing spots.  Denies syncope.  Has had lightheadedness intermittently since 04/2011 when he was diagnosed w/ sinusitis.  Is no longer experiencing sinus pressure and nasal congestion. Pt was prescribed lisinopril by his PCP 1 week ago.  Since then he has had an occasional fluttering sensation w/ associated squeezing sensation in center of chest.  Pain lasts for 1-2 seconds, is non-radiating and no associated sx. Per prior chart, pt admitted in 07/2011 for precordial CP and dizziness.  Evaluated by [facility] who felt that sx were non-cardiac.  Had a neg CXR, pharmacologic stress test showed no inducible ischemia, nml left ventricle and EF and cardiac labs were nml.    Past Medical History  Diagnosis Date  . Ear infection   . Pneumonia   . Bladder cancer     Past Surgical History  Procedure Date  . Bladder surgery   . Mastoid tumor removed     Family History  Problem Relation Age of Onset  . Anemia Neg Hx   . Arrhythmia Neg Hx   . Asthma Neg Hx   . Clotting disorder Neg Hx   . Fainting Neg Hx   . Heart attack Neg Hx   . Heart disease Neg Hx   . Heart failure Neg Hx   . Hyperlipidemia Neg Hx   . Hypertension Neg Hx     History  Substance Use Topics  . Smoking status: Never Smoker   . Smokeless tobacco: Not on file  . Alcohol Use: No       Review of Systems  All other systems reviewed and are negative.    Allergies  Codeine  Home Medications   Current Outpatient Rx  Name Route Sig Dispense Refill  . DM-GUAIFENESIN ER 30-600 MG PO TB12 Oral Take 1 tablet by mouth as needed.    Marland Kitchen FEXOFENADINE HCL 180 MG PO TABS Oral Take 180 mg by mouth daily.    Marland Kitchen FLUTICASONE PROPIONATE 50 MCG/ACT NA SUSP Nasal Place 2 sprays into the nose 2 (two) times daily.    . ADULT MULTIVITAMIN W/MINERALS CH Oral Take 1 tablet by mouth daily. Generic centrum    . SILDENAFIL CITRATE 100 MG PO TABS Oral Take 25 mg by mouth as needed.      BP 140/78  Pulse 70  Temp(Src) 97.8 F (36.6 C) (Oral)  Resp 13  SpO2 97%  Physical Exam  Nursing note and vitals reviewed. Constitutional: He is oriented to person, place, and time. He appears well-developed and well-nourished. No distress.  HENT:  Head: Normocephalic and atraumatic.  Eyes:       Normal appearance  Neck: Normal range of motion.  Cardiovascular: Normal rate, regular rhythm and intact distal pulses.   Pulmonary/Chest: Effort normal and breath sounds normal. No respiratory distress. He exhibits no tenderness.  Abdominal: Soft. Bowel sounds are normal. He exhibits no distension. There is  no tenderness. There is no guarding.  Musculoskeletal:       No peripheral edema or calf tenderness  Neurological: He is alert and oriented to person, place, and time. No cranial nerve deficit. Coordination normal.       5/5 and equal upper and lower extremity strength.  No sensory deficits.  No past pointing.     Skin: Skin is warm and dry. No rash noted.  Psychiatric: He has a normal mood and affect. His behavior is normal.    ED Course  Procedures (including critical care time)   Date: 08/31/2011  Rate: 70  Rhythm: normal sinus rhythm  QRS Axis: normal  Intervals: normal  ST/T Wave abnormalities: normal  Conduction Disutrbances:none  Narrative Interpretation:   Old EKG Reviewed:  unchanged   Labs Reviewed - No data to display No results found.   1. Lightheadedness       MDM   Pt presents w/ c/o 2 pre-syncopal episodes as well as HTN today.  Has also had intermittent, atypical CP since being started on lisinopril Initial BP 166/99 but improved during ED visit; currently 149/80.   On exam, afebrile, heart w/ RRR, lungs CTA, no focal neuro deficits.  EKG non-ischemic and labs unremarkable.  All results discussed w/ pt.  Recommended that he follow up with his doctor for blood pressure recheck as well as persistent lightheadedness because he/she may wish to place him on a holter monitor.  Also recommended that he check is BP qd and keep a diary to share with his doctor.  At time of discharge, pt vocalized concern that his sx may be neurologic because he continues to feel lightheaded and feels like he is in haze.  CT head ordered to reassure him and is normal.  D/c'd home.  Return precautions discussed.      Arie Sabina [place] 08/31/11 [phone]

## 2011-08-31 NOTE — ED Provider Notes (Addendum)
77 year old male comes in with an episode of near syncope. He has been evaluated for orthostatic near syncope in the past. Today, he had nausea and at feeling of near syncope lasted for about an hour. Nausea and lasting an hour or unusual for him. His exam is unremarkable. There are no carotid bruits. Heart has regular rate and rhythm without murmur. There is no peripheral edema. Workup has been initiated.  Dione Booze, MD 08/31/11 1346  Dione Booze, MD 08/31/11 [phone]

## 2011-09-02 NOTE — ED Provider Notes (Signed)
Medical screening examination/treatment/procedure(s) were performed by non-physician practitioner and as supervising physician I was immediately available for consultation/collaboration.   Dione Booze, MD 09/02/11 1435

## 2011-09-16 ENCOUNTER — Ambulatory Visit: Payer: Medicare Other | Admitting: Cardiology

## 2011-09-20 NOTE — Telephone Encounter (Signed)
Records re faxed to [facility] @ [phone] 09/20/11/KM

## 2012-05-01 ENCOUNTER — Other Ambulatory Visit: Payer: Self-pay | Admitting: Family Medicine

## 2012-05-01 ENCOUNTER — Ambulatory Visit
Admission: RE | Admit: 2012-05-01 | Discharge: 2012-05-01 | Disposition: A | Discharge status: Home / Self Care | Payer: Medicare Other | Source: Ambulatory Visit | Attending: Family Medicine | Admitting: Family Medicine

## 2012-05-01 DIAGNOSIS — J329 Chronic sinusitis, unspecified: Secondary | ICD-10-CM

## 2013-01-04 ENCOUNTER — Encounter (HOSPITAL_BASED_OUTPATIENT_CLINIC_OR_DEPARTMENT_OTHER): Payer: Self-pay | Admitting: *Deleted

## 2013-01-04 NOTE — Progress Notes (Signed)
Was told local or [facility]-will call dr Neita Carp labs and ekg May need istat if [facility]
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
## 2013-01-09 ENCOUNTER — Encounter (HOSPITAL_BASED_OUTPATIENT_CLINIC_OR_DEPARTMENT_OTHER)
Admission: RE | Disposition: A | Discharge status: Home / Self Care | Payer: Self-pay | Source: Ambulatory Visit | Attending: Otolaryngology

## 2013-01-09 ENCOUNTER — Encounter (HOSPITAL_BASED_OUTPATIENT_CLINIC_OR_DEPARTMENT_OTHER): Payer: Self-pay | Admitting: *Deleted

## 2013-01-09 ENCOUNTER — Ambulatory Visit (HOSPITAL_BASED_OUTPATIENT_CLINIC_OR_DEPARTMENT_OTHER): Payer: Medicare Other | Admitting: Anesthesiology

## 2013-01-09 ENCOUNTER — Ambulatory Visit (HOSPITAL_BASED_OUTPATIENT_CLINIC_OR_DEPARTMENT_OTHER)
Admission: RE | Admit: 2013-01-09 | Discharge: 2013-01-09 | Disposition: A | Discharge status: Home / Self Care | Payer: Medicare Other | Source: Ambulatory Visit | Attending: Otolaryngology | Admitting: Otolaryngology

## 2013-01-09 ENCOUNTER — Encounter (HOSPITAL_BASED_OUTPATIENT_CLINIC_OR_DEPARTMENT_OTHER): Payer: Self-pay | Admitting: Anesthesiology

## 2013-01-09 DIAGNOSIS — Z8701 Personal history of pneumonia (recurrent): Secondary | ICD-10-CM | POA: Insufficient documentation

## 2013-01-09 DIAGNOSIS — Z8551 Personal history of malignant neoplasm of bladder: Secondary | ICD-10-CM | POA: Insufficient documentation

## 2013-01-09 DIAGNOSIS — Z7982 Long term (current) use of aspirin: Secondary | ICD-10-CM | POA: Insufficient documentation

## 2013-01-09 DIAGNOSIS — Z885 Allergy status to narcotic agent status: Secondary | ICD-10-CM | POA: Insufficient documentation

## 2013-01-09 DIAGNOSIS — R51 Headache: Secondary | ICD-10-CM | POA: Insufficient documentation

## 2013-01-09 DIAGNOSIS — Z79899 Other long term (current) drug therapy: Secondary | ICD-10-CM | POA: Insufficient documentation

## 2013-01-09 DIAGNOSIS — K219 Gastro-esophageal reflux disease without esophagitis: Secondary | ICD-10-CM | POA: Insufficient documentation

## 2013-01-09 DIAGNOSIS — I10 Essential (primary) hypertension: Secondary | ICD-10-CM | POA: Insufficient documentation

## 2013-01-09 DIAGNOSIS — H9190 Unspecified hearing loss, unspecified ear: Secondary | ICD-10-CM | POA: Insufficient documentation

## 2013-01-09 DIAGNOSIS — J309 Allergic rhinitis, unspecified: Secondary | ICD-10-CM | POA: Insufficient documentation

## 2013-01-09 HISTORY — PX: ARTERY BIOPSY: SHX891

## 2013-01-09 HISTORY — DX: Essential (primary) hypertension: I10

## 2013-01-09 HISTORY — DX: Unspecified hearing loss, unspecified ear: H91.90

## 2013-01-09 HISTORY — DX: Gastro-esophageal reflux disease without esophagitis: K21.9

## 2013-01-09 HISTORY — DX: Other seasonal allergic rhinitis: J30.2

## 2013-01-09 SURGERY — BIOPSY TEMPORAL ARTERY
Anesthesia: Monitor Anesthesia Care | Site: Face | Laterality: Left | Wound class: Clean

## 2013-01-09 MED ORDER — FENTANYL CITRATE 0.05 MG/ML IJ SOLN: 50.0000 ug | INTRAMUSCULAR | Status: DC | PRN

## 2013-01-09 MED ORDER — CEPHALEXIN 500 MG PO CAPS: 500.0000 mg | ORAL_CAPSULE | Freq: Three times a day (TID) | ORAL | Status: AC

## 2013-01-09 MED ORDER — FENTANYL CITRATE 0.05 MG/ML IJ SOLN
INTRAMUSCULAR | Status: DC | PRN
  Administered 2013-01-09 (×2): 25 ug via INTRAVENOUS
  Administered 2013-01-09: 50 ug via INTRAVENOUS

## 2013-01-09 MED ORDER — FENTANYL CITRATE 0.05 MG/ML IJ SOLN: 25.0000 ug | INTRAMUSCULAR | Status: DC | PRN

## 2013-01-09 MED ORDER — MIDAZOLAM HCL 5 MG/5ML IJ SOLN
INTRAMUSCULAR | Status: DC | PRN
  Administered 2013-01-09: .5 mg via INTRAVENOUS
  Administered 2013-01-09: 1 mg via INTRAVENOUS
  Administered 2013-01-09: .5 mg via INTRAVENOUS

## 2013-01-09 MED ORDER — ONDANSETRON HCL 4 MG/2ML IJ SOLN
INTRAMUSCULAR | Status: DC | PRN
  Administered 2013-01-09: 4 mg via INTRAVENOUS

## 2013-01-09 MED ORDER — LIDOCAINE-EPINEPHRINE 1 %-1:100000 IJ SOLN
INTRAMUSCULAR | Status: DC | PRN
  Administered 2013-01-09: 1 mL

## 2013-01-09 MED ORDER — MIDAZOLAM HCL 2 MG/2ML IJ SOLN: 1.0000 mg | INTRAMUSCULAR | Status: DC | PRN

## 2013-01-09 MED ORDER — LACTATED RINGERS IV SOLN
INTRAVENOUS | Status: DC
  Administered 2013-01-09: 07:00:00 via INTRAVENOUS

## 2013-01-09 SURGICAL SUPPLY — 53 items
ADH SKN CLS APL DERMABOND .7 (GAUZE/BANDAGES/DRESSINGS) ×1
APL SKNCLS STERI-STRIP NONHPOA (GAUZE/BANDAGES/DRESSINGS)
BANDAGE ADHESIVE 1X3 (GAUZE/BANDAGES/DRESSINGS) IMPLANT
BENZOIN TINCTURE PRP APPL 2/3 (GAUZE/BANDAGES/DRESSINGS) IMPLANT
BLADE SURG 15 STRL LF DISP TIS (BLADE) ×1 IMPLANT
BLADE SURG 15 STRL SS (BLADE) ×2
BLADE SURG ROTATE 9660 (MISCELLANEOUS) ×1 IMPLANT
CANISTER SUCTION 1200CC (MISCELLANEOUS) IMPLANT
CLEANER CAUTERY TIP 5X5 PAD (MISCELLANEOUS) ×1 IMPLANT
CORDS BIPOLAR (ELECTRODE) ×1 IMPLANT
COVER MAYO STAND STRL (DRAPES) ×2 IMPLANT
COVER PROBE W GEL 5X96 (DRAPES) IMPLANT
COVER TABLE BACK 60X90 (DRAPES) ×1 IMPLANT
DERMABOND ADVANCED (GAUZE/BANDAGES/DRESSINGS) ×1
DERMABOND ADVANCED .7 DNX12 (GAUZE/BANDAGES/DRESSINGS) IMPLANT
DRAPE SURG 17X23 STRL (DRAPES) IMPLANT
DRAPE U-SHAPE 76X120 STRL (DRAPES) IMPLANT
ELECT COATED BLADE 2.86 ST (ELECTRODE) ×2 IMPLANT
ELECT NDL BLADE 2-5/6 (NEEDLE) IMPLANT
ELECT NEEDLE BLADE 2-5/6 (NEEDLE) IMPLANT
ELECT REM PT RETURN 9FT ADLT (ELECTROSURGICAL) ×2
ELECTRODE REM PT RTRN 9FT ADLT (ELECTROSURGICAL) IMPLANT
GAUZE SPONGE 4X4 12PLY STRL LF (GAUZE/BANDAGES/DRESSINGS) IMPLANT
GLOVE BIO SURGEON STRL SZ7 (GLOVE) ×2 IMPLANT
GLOVE SS BIOGEL STRL SZ 7.5 (GLOVE) ×1 IMPLANT
GLOVE SUPERSENSE BIOGEL SZ 7.5 (GLOVE) ×1
GOWN PREVENTION PLUS XLARGE (GOWN DISPOSABLE) ×3 IMPLANT
GOWN PREVENTION PLUS XXLARGE (GOWN DISPOSABLE) ×1 IMPLANT
NDL HYPO 25X1 1.5 SAFETY (NEEDLE) IMPLANT
NEEDLE 27GAX1X1/2 (NEEDLE) ×2 IMPLANT
NEEDLE HYPO 25X1 1.5 SAFETY (NEEDLE) IMPLANT
NS IRRIG 1000ML POUR BTL (IV SOLUTION) ×1 IMPLANT
PACK BASIN DAY SURGERY FS (CUSTOM PROCEDURE TRAY) ×2 IMPLANT
PAD CLEANER CAUTERY TIP 5X5 (MISCELLANEOUS)
PENCIL BUTTON HOLSTER BLD 10FT (ELECTRODE) ×1 IMPLANT
SHEET MEDIUM DRAPE 40X70 STRL (DRAPES) ×2 IMPLANT
SPONGE GAUZE 2X2 8PLY STRL LF (GAUZE/BANDAGES/DRESSINGS) IMPLANT
STRIP CLOSURE SKIN 1/2X4 (GAUZE/BANDAGES/DRESSINGS) IMPLANT
SUCTION FRAZIER TIP 10 FR DISP (SUCTIONS) IMPLANT
SUT CHROMIC 4 0 P 3 18 (SUTURE) IMPLANT
SUT ETHILON 5 0 P 3 18 (SUTURE)
SUT NYLON ETHILON 5-0 P-3 1X18 (SUTURE) IMPLANT
SUT SILK 3 0 TIES 17X18 (SUTURE) ×2
SUT SILK 3-0 18XBRD TIE BLK (SUTURE) ×1 IMPLANT
SUT SILK 4 0 TIES 17X18 (SUTURE) IMPLANT
SUT VIC AB 4-0 P-3 18XBRD (SUTURE) IMPLANT
SUT VIC AB 4-0 P3 18 (SUTURE) ×2
SUT VICRYL 4-0 PS2 18IN ABS (SUTURE) IMPLANT
SWABSTICK POVIDONE IODINE SNGL (MISCELLANEOUS) ×2 IMPLANT
SYR CONTROL 10ML LL (SYRINGE) ×2 IMPLANT
TOWEL OR 17X24 6PK STRL BLUE (TOWEL DISPOSABLE) ×2 IMPLANT
TRAY DSU PREP LF (CUSTOM PROCEDURE TRAY) IMPLANT
TUBE CONNECTING 20X1/4 (TUBING) IMPLANT

## 2013-01-09 NOTE — Anesthesia Postprocedure Evaluation (Signed)
  Anesthesia Post-op Note  Patient: Ronald Russell  Procedure(s) Performed: Procedure(s): BIOPSY TEMPORAL ARTERY (Left)  Patient Location: PACU  Anesthesia Type:MAC  Level of Consciousness: awake  Airway and Oxygen Therapy: Patient Spontanous Breathing  Post-op Pain: none  Post-op Assessment: Post-op Vital signs reviewed  Post-op Vital Signs: Reviewed  Complications: No apparent anesthesia complications

## 2013-01-09 NOTE — Brief Op Note (Signed)
01/09/2013  9:30 AM  PATIENT:  Ronald Russell  78 y.o. male  PRE-OPERATIVE DIAGNOSIS:  HEADACHE  POST-OPERATIVE DIAGNOSIS:  HEADACHE  PROCEDURE:  Procedure(s): BIOPSY TEMPORAL ARTERY (Left)  SURGEON:  Surgeon(s) and Role:    * Drema Halon, MD - Primary  PHYSICIAN ASSISTANT:   ASSISTANTS: none   ANESTHESIA:   MAC  EBL:  Total I/O In: 900 [I.V.:900] Out: -   BLOOD ADMINISTERED:none  DRAINS: none   LOCAL MEDICATIONS USED:  XYLOCAINE   SPECIMEN:  Source of Specimen:  left temporal artery  DISPOSITION OF SPECIMEN:  PATHOLOGY  COUNTS:  YES  TOURNIQUET:  * No tourniquets in log *  DICTATION: .Other Dictation: Dictation Number [id number]  PLAN OF CARE: Discharge to home after PACU  PATIENT DISPOSITION:  PACU - hemodynamically stable.   Delay start of Pharmacological VTE agent (>24hrs) due to surgical blood loss or risk of bleeding: not applicable

## 2013-01-09 NOTE — Interval H&P Note (Signed)
History and Physical Interval Note:  01/09/2013 7:59 AM  Ronald Russell  has presented today for surgery, with the diagnosis of HEADACHE  The various methods of treatment have been discussed with the patient and family. After consideration of risks, benefits and other options for treatment, the patient has consented to  Procedure(s): BIOPSY TEMPORAL ARTERY (N/A) as a surgical intervention .  The patient's history has been reviewed, patient examined, no change in status, stable for surgery.  I have reviewed the patient's chart and labs.  Questions were answered to the patient's satisfaction.     Chino Sardo

## 2013-01-09 NOTE — Transfer of Care (Signed)
Immediate Anesthesia Transfer of Care Note  Patient: Ronald Russell  Procedure(s) Performed: Procedure(s): BIOPSY TEMPORAL ARTERY (Left)  Patient Location: PACU  Anesthesia Type:MAC  Level of Consciousness: awake, alert  and oriented  Airway & Oxygen Therapy: Patient Spontanous Breathing  Post-op Assessment: Report given to PACU RN and Post -op Vital signs reviewed and stable  Post vital signs: Reviewed and stable  Complications: No apparent anesthesia complications

## 2013-01-09 NOTE — Anesthesia Preprocedure Evaluation (Signed)
Anesthesia Evaluation  Patient identified by MRN, date of birth, ID band Patient awake    Reviewed: Allergy & Precautions, H&P , NPO status , Patient's Chart, lab work & pertinent test results  History of Anesthesia Complications (+) PONV  Airway Mallampati: II TM Distance: >3 FB Neck ROM: Full    Dental no notable dental hx. (+) Teeth Intact and Dental Advisory Given   Pulmonary neg pulmonary ROS, pneumonia -, resolved,  breath sounds clear to auscultation  Pulmonary exam normal       Cardiovascular hypertension, On Medications Rhythm:Regular Rate:Normal     Neuro/Psych  Headaches, negative psych ROS   GI/Hepatic Neg liver ROS, GERD-  Medicated and Controlled,  Endo/Other  negative endocrine ROS  Renal/GU negative Renal ROS  negative genitourinary   Musculoskeletal   Abdominal   Peds  Hematology negative hematology ROS (+)   Anesthesia Other Findings   Reproductive/Obstetrics negative OB ROS                           Anesthesia Physical Anesthesia Plan  ASA: II  Anesthesia Plan: MAC   Post-op Pain Management:    Induction: Intravenous  Airway Management Planned: Simple Face Mask  Additional Equipment:   Intra-op Plan:   Post-operative Plan:   Informed Consent: I have reviewed the patients History and Physical, chart, labs and discussed the procedure including the risks, benefits and alternatives for the proposed anesthesia with the patient or authorized representative who has indicated his/her understanding and acceptance.   Dental advisory given  Plan Discussed with: CRNA  Anesthesia Plan Comments:         Anesthesia Quick Evaluation

## 2013-01-09 NOTE — Anesthesia Procedure Notes (Signed)
Procedure Name: MAC Date/Time: 01/09/2013 8:11 AM Performed by: Zenia Resides D Pre-anesthesia Checklist: Patient identified, Emergency Drugs available, Suction available, Patient being monitored and Timeout performed Patient Re-evaluated:Patient Re-evaluated prior to inductionOxygen Delivery Method: Simple face mask Placement Confirmation: positive ETCO2

## 2013-01-10 ENCOUNTER — Encounter (HOSPITAL_BASED_OUTPATIENT_CLINIC_OR_DEPARTMENT_OTHER): Payer: Self-pay | Admitting: Otolaryngology

## 2013-01-10 NOTE — Op Note (Signed)
NAME:  Ronald Russell, Ronald Russell NO.:  [id number]  MEDICAL RECORD NO.:  [id number]  LOCATION:                               FACILITY:  [facility]  PHYSICIAN:  Kristine Garbe. Ezzard Standing, M.D.DATE OF BIRTH:  1935-02-04  DATE OF PROCEDURE:  01/09/2013 DATE OF DISCHARGE:  01/09/2013                              OPERATIVE REPORT   PREOPERATIVE DIAGNOSIS:  Left-sided headaches, questionable temporal arteritis.  POSTOPERATIVE DIAGNOSIS:  Left-sided headaches, questionable temporal arteritis.  OPERATION:  Excisional biopsy of left temporal artery.  SURGEON:  Kristine Garbe. Ezzard Standing, M.D.  ANESTHESIA:  MAC with 3 mL of Xylocaine with epinephrine.  SURGEON:  Kristine Garbe. Ezzard Standing, M.D.  COMPLICATIONS:  None.  BRIEF CLINICAL NOTE:  [name] is a 78 year old gentleman who has been having chronic left-sided headaches.  He has been treated with steroids and this seemed to help the headaches, but when he gets tapered down lower on the steroids, he has recurrent headaches, generally always in the left temporal area in the region of the left temporal artery.  He is taken to the operating room at this time for left temporal artery biopsy.  DESCRIPTION OF PROCEDURE:  The patient was brought to the operating room, received some IV sedation.  The temporal artery was palpated and marked out.  The area was injected with Xylocaine with epinephrine for local anesthetic and area was prepped with Betadine and draped out with sterile towels.  An incision was made directly over the temporal artery. Dissection was carried down to the temporal artery, which was identified and approximately 3 cm segment of temporal artery in the region of his headache was excised.  The divided ends were ligated with 3-0 silk suture.  There was minimal bleeding.  [name] tolerated this well.  The incision site was closed with 4-0 Vicryl subcutaneous and Dermabond on the skin to reapproximate skin edges.  [name] was  discharged home later this morning on Tylenol and Motrin p.r.n. pain along with his regular medications at home.  We will have him follow up in my office in 1 week for recheck of the incision site and review pathology.          ______________________________ Kristine Garbe Ezzard Standing, M.D.     CEN/MEDQ  D:  01/09/2013  T:  01/10/2013  Job:  [id number]  cc:   Gloriajean Dell. Andrey Campanile, M.D.

## 2013-02-13 ENCOUNTER — Encounter (HOSPITAL_COMMUNITY): Payer: Self-pay | Admitting: Emergency Medicine

## 2013-02-13 ENCOUNTER — Observation Stay (HOSPITAL_COMMUNITY)
Admission: EM | Admit: 2013-02-13 | Discharge: 2013-02-14 | Disposition: A | Discharge status: Home / Self Care | Payer: Medicare Other | Attending: Cardiovascular Disease | Admitting: Cardiovascular Disease

## 2013-02-13 ENCOUNTER — Emergency Department (HOSPITAL_COMMUNITY): Payer: Medicare Other

## 2013-02-13 DIAGNOSIS — I4949 Other premature depolarization: Secondary | ICD-10-CM

## 2013-02-13 DIAGNOSIS — E876 Hypokalemia: Secondary | ICD-10-CM | POA: Insufficient documentation

## 2013-02-13 DIAGNOSIS — R002 Palpitations: Secondary | ICD-10-CM | POA: Insufficient documentation

## 2013-02-13 DIAGNOSIS — I951 Orthostatic hypotension: Principal | ICD-10-CM | POA: Diagnosis present

## 2013-02-13 DIAGNOSIS — Z79899 Other long term (current) drug therapy: Secondary | ICD-10-CM | POA: Insufficient documentation

## 2013-02-13 DIAGNOSIS — I10 Essential (primary) hypertension: Secondary | ICD-10-CM | POA: Diagnosis present

## 2013-02-13 DIAGNOSIS — R110 Nausea: Secondary | ICD-10-CM | POA: Insufficient documentation

## 2013-02-13 DIAGNOSIS — H9190 Unspecified hearing loss, unspecified ear: Secondary | ICD-10-CM | POA: Insufficient documentation

## 2013-02-13 DIAGNOSIS — R42 Dizziness and giddiness: Secondary | ICD-10-CM | POA: Diagnosis present

## 2013-02-13 DIAGNOSIS — K219 Gastro-esophageal reflux disease without esophagitis: Secondary | ICD-10-CM | POA: Insufficient documentation

## 2013-02-13 DIAGNOSIS — I493 Ventricular premature depolarization: Secondary | ICD-10-CM | POA: Diagnosis present

## 2013-02-13 DIAGNOSIS — R072 Precordial pain: Secondary | ICD-10-CM

## 2013-02-13 DIAGNOSIS — M316 Other giant cell arteritis: Secondary | ICD-10-CM | POA: Insufficient documentation

## 2013-02-13 DIAGNOSIS — IMO0002 Reserved for concepts with insufficient information to code with codable children: Secondary | ICD-10-CM | POA: Insufficient documentation

## 2013-02-13 DIAGNOSIS — Z8551 Personal history of malignant neoplasm of bladder: Secondary | ICD-10-CM | POA: Insufficient documentation

## 2013-02-13 DIAGNOSIS — R079 Chest pain, unspecified: Secondary | ICD-10-CM | POA: Insufficient documentation

## 2013-02-13 HISTORY — DX: Unspecified chronic bronchitis: J42

## 2013-02-13 HISTORY — DX: Migraine with aura, not intractable, without status migrainosus: G43.109

## 2013-02-13 HISTORY — DX: Personal history of other medical treatment: Z92.89

## 2013-02-13 HISTORY — DX: Other giant cell arteritis: M31.6

## 2013-02-13 LAB — BASIC METABOLIC PANEL
BUN: 19 mg/dL (ref 6–23)
BUN: 15 mg/dL (ref 6–23)
CO2: 26 mEq/L (ref 19–32)
CO2: 27 mEq/L (ref 19–32)
Calcium: 8.5 mg/dL (ref 8.4–10.5)
Calcium: 8.7 mg/dL (ref 8.4–10.5)
Chloride: 106 mEq/L (ref 96–112)
Chloride: 108 mEq/L (ref 96–112)
Creatinine, Ser: 1.13 mg/dL (ref 0.50–1.35)
Creatinine, Ser: 1.12 mg/dL (ref 0.50–1.35)
GFR calc Af Amer: 71 mL/min — ABNORMAL LOW (ref >90)
GFR calc Af Amer: 72 mL/min — ABNORMAL LOW (ref >90)
GFR calc non Af Amer: 61 mL/min — ABNORMAL LOW (ref >90)
GFR calc non Af Amer: 62 mL/min — ABNORMAL LOW (ref >90)
Glucose, Bld: 101 mg/dL — ABNORMAL HIGH (ref 70–99)
Glucose, Bld: 132 mg/dL — ABNORMAL HIGH (ref 70–99)
Potassium: 3.1 mEq/L — ABNORMAL LOW (ref 3.5–5.1)
Potassium: 3.3 mEq/L — ABNORMAL LOW (ref 3.5–5.1)
Sodium: 141 mEq/L (ref 135–145)
Sodium: 143 mEq/L (ref 135–145)

## 2013-02-13 LAB — CBC
HCT: 35 % — ABNORMAL LOW (ref 39.0–52.0)
HCT: 35.9 % — ABNORMAL LOW (ref 39.0–52.0)
Hemoglobin: 12.2 g/dL — ABNORMAL LOW (ref 13.0–17.0)
Hemoglobin: 13 g/dL (ref 13.0–17.0)
MCH: 32.4 pg (ref 26.0–34.0)
MCH: 33.6 pg (ref 26.0–34.0)
MCHC: 34.9 g/dL (ref 30.0–36.0)
MCHC: 36.2 g/dL — ABNORMAL HIGH (ref 30.0–36.0)
MCV: 93.1 fL (ref 78.0–100.0)
MCV: 92.8 fL (ref 78.0–100.0)
Platelets: 129 10*3/uL — ABNORMAL LOW (ref 150–400)
Platelets: 121 10*3/uL — ABNORMAL LOW (ref 150–400)
RBC: 3.76 MIL/uL — ABNORMAL LOW (ref 4.22–5.81)
RBC: 3.87 MIL/uL — ABNORMAL LOW (ref 4.22–5.81)
RDW: 15 % (ref 11.5–15.5)
RDW: 15.2 % (ref 11.5–15.5)
WBC: 6.7 10*3/uL (ref 4.0–10.5)
WBC: 6.5 10*3/uL (ref 4.0–10.5)

## 2013-02-13 LAB — TROPONIN I
Troponin I: <0.3 ng/mL (ref <0.30)
Troponin I: <0.3 ng/mL (ref <0.30)

## 2013-02-13 LAB — MAGNESIUM: Magnesium: 2.1 mg/dL (ref 1.5–2.5)

## 2013-02-13 LAB — PRO B NATRIURETIC PEPTIDE: Pro B Natriuretic peptide (BNP): 88.2 pg/mL (ref 0–450)

## 2013-02-13 MED ORDER — LOSARTAN POTASSIUM 50 MG PO TABS
100.0000 mg | ORAL_TABLET | Freq: Every day | ORAL | Status: DC
  Administered 2013-02-13 – 2013-02-14 (×2): 100 mg via ORAL
  Filled 2013-02-13 (×2): qty 2

## 2013-02-13 MED ORDER — BISOPROLOL-HYDROCHLOROTHIAZIDE 2.5-6.25 MG PO TABS
1.0000 | ORAL_TABLET | Freq: Every day | ORAL | Status: DC
  Administered 2013-02-13 – 2013-02-14 (×2): 1 via ORAL
  Filled 2013-02-13 (×2): qty 1

## 2013-02-13 MED ORDER — PANTOPRAZOLE SODIUM 40 MG PO TBEC
80.0000 mg | DELAYED_RELEASE_TABLET | Freq: Every day | ORAL | Status: DC
  Administered 2013-02-13 – 2013-02-14 (×2): 80 mg via ORAL
  Filled 2013-02-13 (×2): qty 2

## 2013-02-13 MED ORDER — REGADENOSON 0.4 MG/5ML IV SOLN
0.4000 mg | Freq: Once | INTRAVENOUS | Status: AC
  Administered 2013-02-14: 0.4 mg via INTRAVENOUS
  Filled 2013-02-13: qty 5

## 2013-02-13 MED ORDER — ASPIRIN EC 81 MG PO TBEC
81.0000 mg | DELAYED_RELEASE_TABLET | Freq: Every day | ORAL | Status: DC
  Administered 2013-02-13 – 2013-02-14 (×2): 81 mg via ORAL
  Filled 2013-02-13 (×2): qty 1

## 2013-02-13 MED ORDER — ACETAMINOPHEN 325 MG PO TABS
650.0000 mg | ORAL_TABLET | Freq: Once | ORAL | Status: AC
  Administered 2013-02-13: 650 mg via ORAL
  Filled 2013-02-13: qty 2

## 2013-02-13 MED ORDER — SODIUM CHLORIDE 0.9 % IJ SOLN
3.0000 mL | Freq: Two times a day (BID) | INTRAMUSCULAR | Status: DC
  Administered 2013-02-13 – 2013-02-14 (×2): 3 mL via INTRAVENOUS

## 2013-02-13 MED ORDER — SODIUM CHLORIDE 0.9 % IV SOLN: 250.0000 mL | INTRAVENOUS | Status: DC | PRN

## 2013-02-13 MED ORDER — SODIUM CHLORIDE 0.9 % IJ SOLN: 3.0000 mL | INTRAMUSCULAR | Status: DC | PRN

## 2013-02-13 MED ORDER — POTASSIUM CHLORIDE CRYS ER 20 MEQ PO TBCR
20.0000 meq | EXTENDED_RELEASE_TABLET | Freq: Once | ORAL | Status: AC
  Administered 2013-02-13: 20 meq via ORAL
  Filled 2013-02-13: qty 1

## 2013-02-13 MED ORDER — NITROGLYCERIN 0.4 MG SL SUBL: 0.4000 mg | SUBLINGUAL_TABLET | SUBLINGUAL | Status: DC | PRN

## 2013-02-13 MED ORDER — ENOXAPARIN SODIUM 40 MG/0.4ML ~~LOC~~ SOLN
40.0000 mg | SUBCUTANEOUS | Status: DC
  Administered 2013-02-13: 40 mg via SUBCUTANEOUS
  Filled 2013-02-13 (×2): qty 0.4

## 2013-02-13 MED ORDER — ACETAMINOPHEN 325 MG PO TABS: 650.0000 mg | ORAL_TABLET | ORAL | Status: DC | PRN

## 2013-02-13 MED ORDER — LORATADINE 10 MG PO TABS
10.0000 mg | ORAL_TABLET | Freq: Every day | ORAL | Status: DC
  Administered 2013-02-13 – 2013-02-14 (×2): 10 mg via ORAL
  Filled 2013-02-13 (×2): qty 1

## 2013-02-13 MED ORDER — PREDNISONE 10 MG PO TABS
10.0000 mg | ORAL_TABLET | Freq: Every day | ORAL | Status: DC
  Administered 2013-02-13: 10 mg via ORAL
  Filled 2013-02-13 (×2): qty 1

## 2013-02-13 MED ORDER — ZOLPIDEM TARTRATE 5 MG PO TABS
5.0000 mg | ORAL_TABLET | Freq: Every evening | ORAL | Status: DC | PRN
  Administered 2013-02-13: 5 mg via ORAL
  Filled 2013-02-13: qty 1

## 2013-02-13 MED ORDER — ONDANSETRON HCL 4 MG/2ML IJ SOLN: 4.0000 mg | Freq: Four times a day (QID) | INTRAMUSCULAR | Status: DC | PRN

## 2013-02-13 NOTE — ED Notes (Signed)
KADIEN LINEMAN is a 78 y.o. male Chief Complaint  Patient presents with  . Palpitations   Started yesterday q hour with palpitations and today, started having nausea and lightheadedness today with the q hour palpitations.  Denies cp, denies pain.  Given Nitro SL x 2 PTA with EMS and given ASA 325 mg PO PTA from physicians office this morning.   Has been taking prednisone 10 mg q day since procedure approximately 6 weeks ago.
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
## 2013-02-14 ENCOUNTER — Observation Stay (HOSPITAL_COMMUNITY): Payer: Medicare Other

## 2013-02-14 DIAGNOSIS — I517 Cardiomegaly: Secondary | ICD-10-CM

## 2013-02-14 DIAGNOSIS — R079 Chest pain, unspecified: Secondary | ICD-10-CM

## 2013-02-14 DIAGNOSIS — R42 Dizziness and giddiness: Secondary | ICD-10-CM

## 2013-02-14 LAB — CBC
HCT: 38.9 % — ABNORMAL LOW (ref 39.0–52.0)
Hemoglobin: 13.8 g/dL (ref 13.0–17.0)
MCH: 32.9 pg (ref 26.0–34.0)
MCHC: 35.5 g/dL (ref 30.0–36.0)
MCV: 92.6 fL (ref 78.0–100.0)
Platelets: 131 10*3/uL — ABNORMAL LOW (ref 150–400)
RBC: 4.2 MIL/uL — ABNORMAL LOW (ref 4.22–5.81)
RDW: 15.3 % (ref 11.5–15.5)
WBC: 6.7 10*3/uL (ref 4.0–10.5)

## 2013-02-14 LAB — BASIC METABOLIC PANEL
BUN: 12 mg/dL (ref 6–23)
CO2: 23 mEq/L (ref 19–32)
Calcium: 9 mg/dL (ref 8.4–10.5)
Chloride: 106 mEq/L (ref 96–112)
Creatinine, Ser: 1.01 mg/dL (ref 0.50–1.35)
GFR calc Af Amer: 81 mL/min — ABNORMAL LOW (ref >90)
GFR calc non Af Amer: 70 mL/min — ABNORMAL LOW (ref >90)
Glucose, Bld: 119 mg/dL — ABNORMAL HIGH (ref 70–99)
Potassium: 4.3 mEq/L (ref 3.5–5.1)
Sodium: 139 mEq/L (ref 135–145)

## 2013-02-14 LAB — TROPONIN I: Troponin I: <0.3 ng/mL (ref <0.30)

## 2013-02-14 LAB — TSH: TSH: 4.093 u[IU]/mL (ref 0.350–4.500)

## 2013-02-14 LAB — LIPID PANEL
Cholesterol: 189 mg/dL (ref 0–200)
HDL: 40 mg/dL (ref >39)
LDL Cholesterol: 121 mg/dL — ABNORMAL HIGH (ref 0–99)
Total CHOL/HDL Ratio: 4.7 RATIO
Triglycerides: 138 mg/dL (ref <150)
VLDL: 28 mg/dL (ref 0–40)

## 2013-02-14 LAB — HEMOGLOBIN A1C
Hgb A1c MFr Bld: 5.2 % (ref <5.7)
Mean Plasma Glucose: 103 mg/dL (ref <117)

## 2013-02-14 LAB — T4, FREE: Free T4: 1.06 ng/dL (ref 0.80–1.80)

## 2013-02-14 MED ORDER — TECHNETIUM TC 99M SESTAMIBI GENERIC - CARDIOLITE
10.0000 | Freq: Once | INTRAVENOUS | Status: AC | PRN
  Administered 2013-02-14: 10 via INTRAVENOUS

## 2013-02-14 MED ORDER — TECHNETIUM TC 99M SESTAMIBI GENERIC - CARDIOLITE
30.0000 | Freq: Once | INTRAVENOUS | Status: AC | PRN
  Administered 2013-02-14: 30 via INTRAVENOUS

## 2013-02-14 MED ORDER — REGADENOSON 0.4 MG/5ML IV SOLN
INTRAVENOUS | Status: AC
  Filled 2013-02-14: qty 5

## 2013-02-14 MED ORDER — POTASSIUM CHLORIDE ER 10 MEQ PO CPCR: 10.0000 meq | ORAL_CAPSULE | Freq: Every day | ORAL | Status: DC

## 2013-02-14 MED ORDER — LOSARTAN POTASSIUM 100 MG PO TABS: 100.0000 mg | ORAL_TABLET | Freq: Every day | ORAL | Status: DC

## 2013-02-14 MED ORDER — POTASSIUM CHLORIDE CRYS ER 20 MEQ PO TBCR
40.0000 meq | EXTENDED_RELEASE_TABLET | Freq: Once | ORAL | Status: AC
  Administered 2013-02-14: 40 meq via ORAL
  Filled 2013-02-14: qty 2

## 2013-02-14 NOTE — Discharge Summary (Signed)
CARDIOLOGY DISCHARGE SUMMARY   Patient ID: Ronald Russell MRN: [id number] DOB/AGE: Apr 11, 1935 78 y.o.  Admit date: 02/13/2013 Discharge date: 02/14/2013  Primary Discharge Diagnosis:     Symptomatic PVCs and chest pain  Secondary Discharge Diagnosis:    Orthostatic hypotension   HTN (hypertension)   Dizziness   GERD (gastroesophageal reflux disease)  Procedures:  [facility] Course: Ronald Russell is a 78 y.o. male with no history of CAD. He had chest discomfort and went to see his primary care physician. His primary care physician recommended treatment in the emergency room. He was transported by EMS and given sublingual nitroglycerin. His symptoms did not change significantly. He was admitted for further evaluation and treatment.  His cardiac enzymes were negative for MI. He noted a history of increasing dyspnea on exertion since his most recent prednisone therapy. He was hypokalemic, felt secondary to prednisone therapy and diuretic therapy. His potassium was supplemented and improved. He will be on a low dose of potassium at discharge. He was continued on a low dose of aspirin. His cardiac enzymes were cycled but remained negative. He had some mild thrombocytopenia but no bleeding problems. His other labs including a lipid profile were reviewed. His LDL is elevated and dietary changes will be recommended but no medication unless he has coronary artery disease.  On 02/14/2013, he had an echocardiogram and a Lexi scan Cardiolite. Results are below. The echo showed a preserved EF. The Lexiscan had no scar or ischemia and his EF is preserved. His symptoms have resolved and he is feeling much better. He was evaluated by Dr. Antoine Poche, who considered him stable for discharge, to follow up as an outpatient with primary care for an office visit and a repeat BMET in about 2 weeks.  Labs:   Lab Results  Component Value Date   WBC 6.7 02/14/2013   HGB 13.8 02/14/2013   HCT 38.9*  02/14/2013   MCV 92.6 02/14/2013   PLT 131* 02/14/2013     Recent Labs Lab 02/14/13 0430  NA 139  K 4.3  CL 106  CO2 23  BUN 12  CREATININE 1.01  CALCIUM 9.0  GLUCOSE 119*    Recent Labs  02/13/13 1509 02/13/13 2240 02/14/13 0415  TROPONINI <0.30 <0.30 <0.30   Lipid Panel     Component Value Date/Time   CHOL 189 02/14/2013 0430   TRIG 138 02/14/2013 0430   HDL 40 02/14/2013 0430   CHOLHDL 4.7 02/14/2013 0430   VLDL 28 02/14/2013 0430   LDLCALC 121* 02/14/2013 0430    Pro B Natriuretic peptide (BNP)  Date/Time Value Range Status  02/13/2013  3:09 PM 88.2  0 - 450 pg/mL Final     Radiology: Dg Chest Port 1 View 02/13/2013   CLINICAL DATA:  78 year old male with dizziness palpitations hypertension.  EXAM: PORTABLE CHEST - 1 VIEW  COMPARISON:  05/29/2012.  FINDINGS: Portable upright AP view at 1517 hrs. Stable lung volumes. Normal cardiac size and mediastinal contours. Visualized tracheal air column is within normal limits. Multiple EKG leads and wires overlie the chest. Allowing for portable technique, the lungs are clear.  IMPRESSION: No acute cardiopulmonary abnormality.   Electronically Signed   By: Augusto Gamble   On: 02/13/2013 15:25   Nm Myocar Multi W/spect W/wall Motion / Ef 02/14/2013   CLINICAL DATA:  Chest pain  EXAM: MYOCARDIAL IMAGING WITH SPECT (REST AND PHARMACOLOGIC-STRESS)  GATED LEFT VENTRICULAR WALL MOTION STUDY  LEFT VENTRICULAR EJECTION FRACTION  TECHNIQUE: Standard myocardial SPECT imaging was performed after resting intravenous injection of 10 mCi Tc-53m sestamibi Subsequently, intravenous infusion of Lexiscan was performed under the supervision of the Cardiology staff. At peak effect of the drug, 30 mCi Tc-40m sestamibi was injected intravenously and standard myocardial SPECT imaging was performed. Quantitative gated imaging was also performed to evaluate left ventricular wall motion, and estimate left ventricular ejection fraction.  COMPARISON:  08/01/2011  FINDINGS: No  significant perfusion defect to suggest scar or inducible ischemia.  End-diastolic volume is 49 cc. End systolic volume is 11 cc. Derived left ventricular ejection fraction is 78%.  Left ventricular wall motion wall thickening appear normal.  IMPRESSION: 1. Normal myocardial perfusion study with left ventricular ejection fraction likely mildly overestimated, at 78%.   Electronically Signed   By: Herbie Baltimore   On: 02/14/2013 15:11    EKG: SR, no acute changes, PVCS  Echo: 02/14/2013 Study Conclusions Left ventricle: The cavity size was normal. Wall thickness was increased in a pattern of mild LVH. Systolic function was vigorous. The estimated ejection fraction was in the range of 65% to 70%. Wall motion was normal; there were no regional wall motion abnormalities.   FOLLOW UP PLANS AND APPOINTMENTS Allergies  Allergen Reactions  . Codeine Other (See Comments)    Severe headache, nausea and vomiting  . Oxycodone Nausea And Vomiting  . Vicodin [Hydrocodone-Acetaminophen] Nausea And Vomiting     Medication List    STOP taking these medications       amLODipine 5 MG tablet  Commonly known as:  NORVASC      TAKE these medications       aspirin EC 81 MG tablet  Take 81 mg by mouth daily.     bisoprolol-hydrochlorothiazide 2.5-6.25 MG per tablet  Commonly known as:  ZIAC  Take 1 tablet by mouth daily.     fexofenadine 180 MG tablet  Commonly known as:  ALLEGRA  Take 180 mg by mouth daily.     losartan 100 MG tablet  Commonly known as:  COZAAR  Take 1 tablet (100 mg total) by mouth daily.     omeprazole 40 MG capsule  Commonly known as:  PRILOSEC  Take 40 mg by mouth 2 (two) times daily.     potassium chloride 10 MEQ CR capsule  Commonly known as:  MICRO-K  Take 1 capsule (10 mEq total) by mouth daily.     predniSONE 10 MG tablet  Commonly known as:  DELTASONE  Take 10 mg by mouth daily.     vitamin E 400 UNIT capsule  Take 400 Units by mouth daily.         Discharge Orders   Future Orders Complete By Expires   Diet - low sodium heart healthy  As directed    Increase activity slowly  As directed      Follow-up Information   Schedule an appointment as soon as possible for a visit with Pamelia Hoit, MD. (Check potassium and follow up with him in 2 weeks.)    Specialty:  Family Medicine   Contact information:   [address] [place] [address] [phone]       Follow up with Rollene Rotunda, MD. (As needed)    Specialty:  Cardiology   Contact information:   [address] [place], [place] [address] [phone]       BRING ALL MEDICATIONS WITH YOU TO FOLLOW UP APPOINTMENTS  Time spent with patient  to include physician time: 36 min Signed: Theodore Demark, PA-C 02/14/2013, 4:37 PM Co-Sign MD

## 2013-02-14 NOTE — Progress Notes (Signed)
Lexiscan Cardiolite performed.  Pre-test: no chest pain but was nauseated. Has been dealing with nausea daily, both before and after taking am meds.

## 2013-02-14 NOTE — Progress Notes (Signed)
SUBJECTIVE:  The patient felt well overnight. He had no palpitations. He's had no more chest discomfort although he says this wasn't a significant complaint.   PHYSICAL EXAM Filed Vitals:   02/13/13 2021 02/13/13 2131 02/13/13 2314 02/14/13 0451  BP: 143/73 112/70 110/65 110/67  Pulse: 68 70 67 65  Temp: 97.7 F (36.5 C) 97.8 F (36.6 C)  98.2 F (36.8 C)  TempSrc: Oral Oral  Oral  Resp: 19 18  19   Height: 5\' 11"  (1.803 m)     Weight: 203 lb 11.2 oz (92.398 kg)   201 lb 1.6 oz (91.218 kg)  SpO2: 96% 96%  93%   General:  No distress Lungs:  Clear Heart:  RRR Abdomen: Positive bowel sounds, no rebound no guarding Extremities:  No edema  LABS: Lab Results  Component Value Date   TROPONINI <0.30 02/14/2013   Results for orders placed during the hospital encounter of 02/13/13 (from the past 24 hour(s))  PRO B NATRIURETIC PEPTIDE     Status: None   Collection Time    02/13/13  3:09 PM      Result Value Range   Pro B Natriuretic peptide (BNP) 88.2  0 - 450 pg/mL  TROPONIN I     Status: None   Collection Time    02/13/13  3:09 PM      Result Value Range   Troponin I <0.30  <0.30 ng/mL  CBC     Status: Abnormal   Collection Time    02/13/13  3:28 PM      Result Value Range   WBC 6.7  4.0 - 10.5 K/uL   RBC 3.76 (*) 4.22 - 5.81 MIL/uL   Hemoglobin 12.2 (*) 13.0 - 17.0 g/dL   HCT 16.1 (*) 09.6 - 04.5 %   MCV 93.1  78.0 - 100.0 fL   MCH 32.4  26.0 - 34.0 pg   MCHC 34.9  30.0 - 36.0 g/dL   RDW 40.9  81.1 - 91.4 %   Platelets 129 (*) 150 - 400 K/uL  BASIC METABOLIC PANEL     Status: Abnormal   Collection Time    02/13/13  3:28 PM      Result Value Range   Sodium 141  135 - 145 mEq/L   Potassium 3.1 (*) 3.5 - 5.1 mEq/L   Chloride 106  96 - 112 mEq/L   CO2 26  19 - 32 mEq/L   Glucose, Bld 101 (*) 70 - 99 mg/dL   BUN 19  6 - 23 mg/dL   Creatinine, Ser 7.82  0.50 - 1.35 mg/dL   Calcium 8.5  8.4 - 95.6 mg/dL   GFR calc non Af Amer 61 (*) >90 mL/min   GFR calc Af Amer  71 (*) >90 mL/min  TROPONIN I     Status: None   Collection Time    02/13/13 10:40 PM      Result Value Range   Troponin I <0.30  <0.30 ng/mL  MAGNESIUM     Status: None   Collection Time    02/13/13 10:40 PM      Result Value Range   Magnesium 2.1  1.5 - 2.5 mg/dL  CBC     Status: Abnormal   Collection Time    02/13/13 10:40 PM      Result Value Range   WBC 6.5  4.0 - 10.5 K/uL   RBC 3.87 (*) 4.22 - 5.81 MIL/uL   Hemoglobin 13.0  13.0 - 17.0  g/dL   HCT 16.1 (*) 09.6 - 04.5 %   MCV 92.8  78.0 - 100.0 fL   MCH 33.6  26.0 - 34.0 pg   MCHC 36.2 (*) 30.0 - 36.0 g/dL   RDW 40.9  81.1 - 91.4 %   Platelets 121 (*) 150 - 400 K/uL  BASIC METABOLIC PANEL     Status: Abnormal   Collection Time    02/13/13 10:40 PM      Result Value Range   Sodium 143  135 - 145 mEq/L   Potassium 3.3 (*) 3.5 - 5.1 mEq/L   Chloride 108  96 - 112 mEq/L   CO2 27  19 - 32 mEq/L   Glucose, Bld 132 (*) 70 - 99 mg/dL   BUN 15  6 - 23 mg/dL   Creatinine, Ser 7.82  0.50 - 1.35 mg/dL   Calcium 8.7  8.4 - 95.6 mg/dL   GFR calc non Af Amer 62 (*) >90 mL/min   GFR calc Af Amer 72 (*) >90 mL/min  TROPONIN I     Status: None   Collection Time    02/14/13  4:15 AM      Result Value Range   Troponin I <0.30  <0.30 ng/mL  BASIC METABOLIC PANEL     Status: Abnormal   Collection Time    02/14/13  4:30 AM      Result Value Range   Sodium 139  135 - 145 mEq/L   Potassium 4.3  3.5 - 5.1 mEq/L   Chloride 106  96 - 112 mEq/L   CO2 23  19 - 32 mEq/L   Glucose, Bld 119 (*) 70 - 99 mg/dL   BUN 12  6 - 23 mg/dL   Creatinine, Ser 2.13  0.50 - 1.35 mg/dL   Calcium 9.0  8.4 - 08.6 mg/dL   GFR calc non Af Amer 70 (*) >90 mL/min   GFR calc Af Amer 81 (*) >90 mL/min  CBC     Status: Abnormal   Collection Time    02/14/13  4:30 AM      Result Value Range   WBC 6.7  4.0 - 10.5 K/uL   RBC 4.20 (*) 4.22 - 5.81 MIL/uL   Hemoglobin 13.8  13.0 - 17.0 g/dL   HCT 57.8 (*) 46.9 - 62.9 %   MCV 92.6  78.0 - 100.0 fL   MCH  32.9  26.0 - 34.0 pg   MCHC 35.5  30.0 - 36.0 g/dL   RDW 52.8  41.3 - 24.4 %   Platelets 131 (*) 150 - 400 K/uL  LIPID PANEL     Status: Abnormal   Collection Time    02/14/13  4:30 AM      Result Value Range   Cholesterol 189  0 - 200 mg/dL   Triglycerides 010  <272 mg/dL   HDL 40  >53 mg/dL   Total CHOL/HDL Ratio 4.7     VLDL 28  0 - 40 mg/dL   LDL Cholesterol 664 (*) 0 - 99 mg/dL    Intake/Output Summary (Last 24 hours) at 02/14/13 0707 Last data filed at 02/14/13 0455  Gross per 24 hour  Intake      0 ml  Output   1100 ml  Net  -1100 ml    EKG:  NSR, rate 74, no acute ST T wave changes. 02/14/2013  ASSESSMENT AND PLAN:  CHEST PAIN:  Lexiscan Myoview pending. No objective evidence of ischemia.  Troponins  negative.   HTN:   This is well controlled .  No change in therapy.    PVCs:  Telemetry reviewed.  No change in therapy.  No further palpitations.   Fayrene Fearing [facility] 02/14/2013 7:07 AM

## 2013-04-11 ENCOUNTER — Other Ambulatory Visit (HOSPITAL_COMMUNITY): Payer: Self-pay | Admitting: Physician Assistant

## 2013-04-18 DIAGNOSIS — F419 Anxiety disorder, unspecified: Secondary | ICD-10-CM

## 2013-04-18 DIAGNOSIS — M316 Other giant cell arteritis: Secondary | ICD-10-CM | POA: Insufficient documentation

## 2013-04-18 DIAGNOSIS — I10 Essential (primary) hypertension: Secondary | ICD-10-CM | POA: Insufficient documentation

## 2013-10-17 ENCOUNTER — Other Ambulatory Visit: Payer: Self-pay | Admitting: Family Medicine

## 2013-10-17 DIAGNOSIS — N63 Unspecified lump in unspecified breast: Secondary | ICD-10-CM

## 2013-10-17 DIAGNOSIS — N644 Mastodynia: Secondary | ICD-10-CM

## 2013-10-24 ENCOUNTER — Ambulatory Visit
Admission: RE | Admit: 2013-10-24 | Discharge: 2013-10-24 | Disposition: A | Discharge status: Home / Self Care | Payer: Medicare Other | Source: Ambulatory Visit | Attending: Family Medicine | Admitting: Family Medicine

## 2013-10-24 DIAGNOSIS — N644 Mastodynia: Secondary | ICD-10-CM

## 2013-10-24 DIAGNOSIS — N63 Unspecified lump in unspecified breast: Secondary | ICD-10-CM

## 2013-11-14 IMAGING — CR DG CHEST 2V
2 series · 2 of 2 positions shown · non-contrast
Comparison: Chest radiograph 08/04/2009

CLINICAL DATA: Chest pain shortness of breath

CHEST - 2 VIEW

[w chest pa]
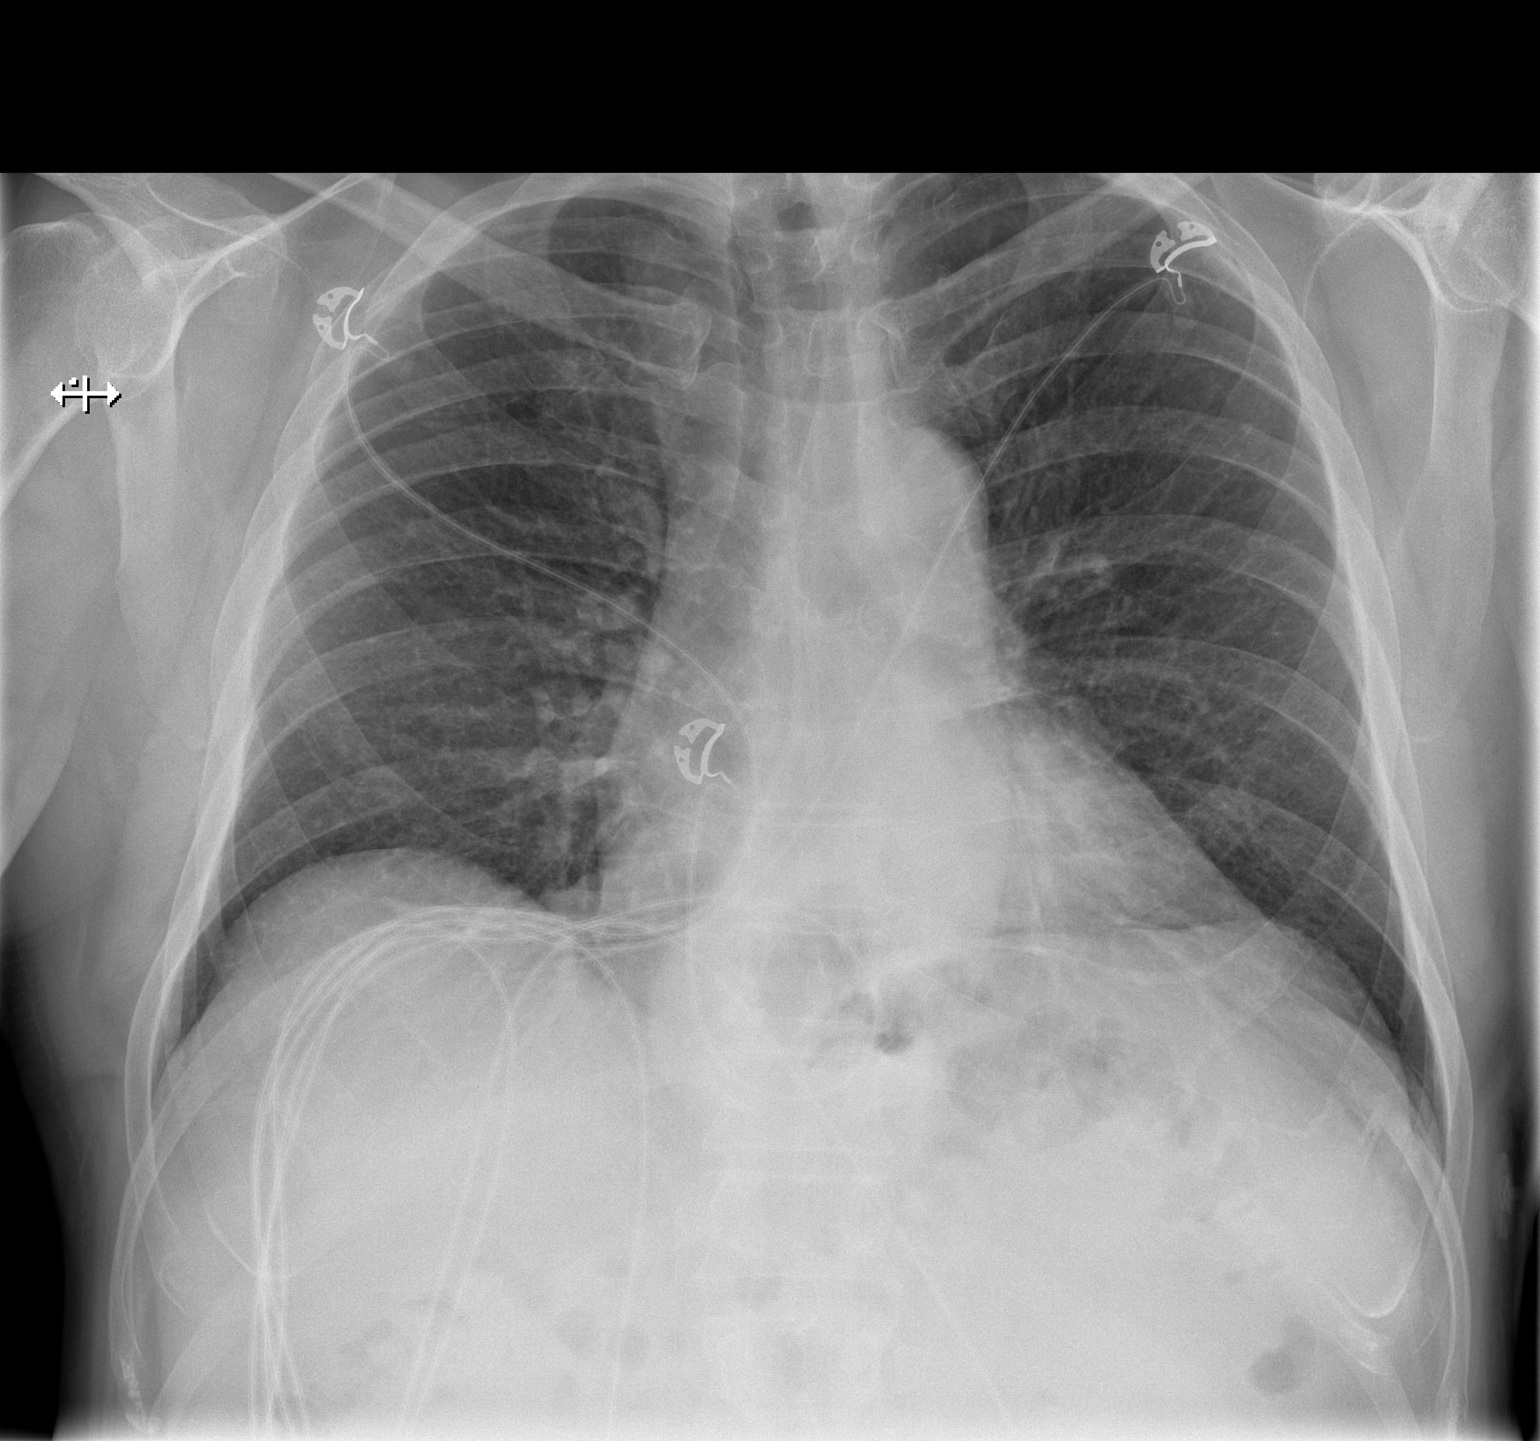

[w chest lat]
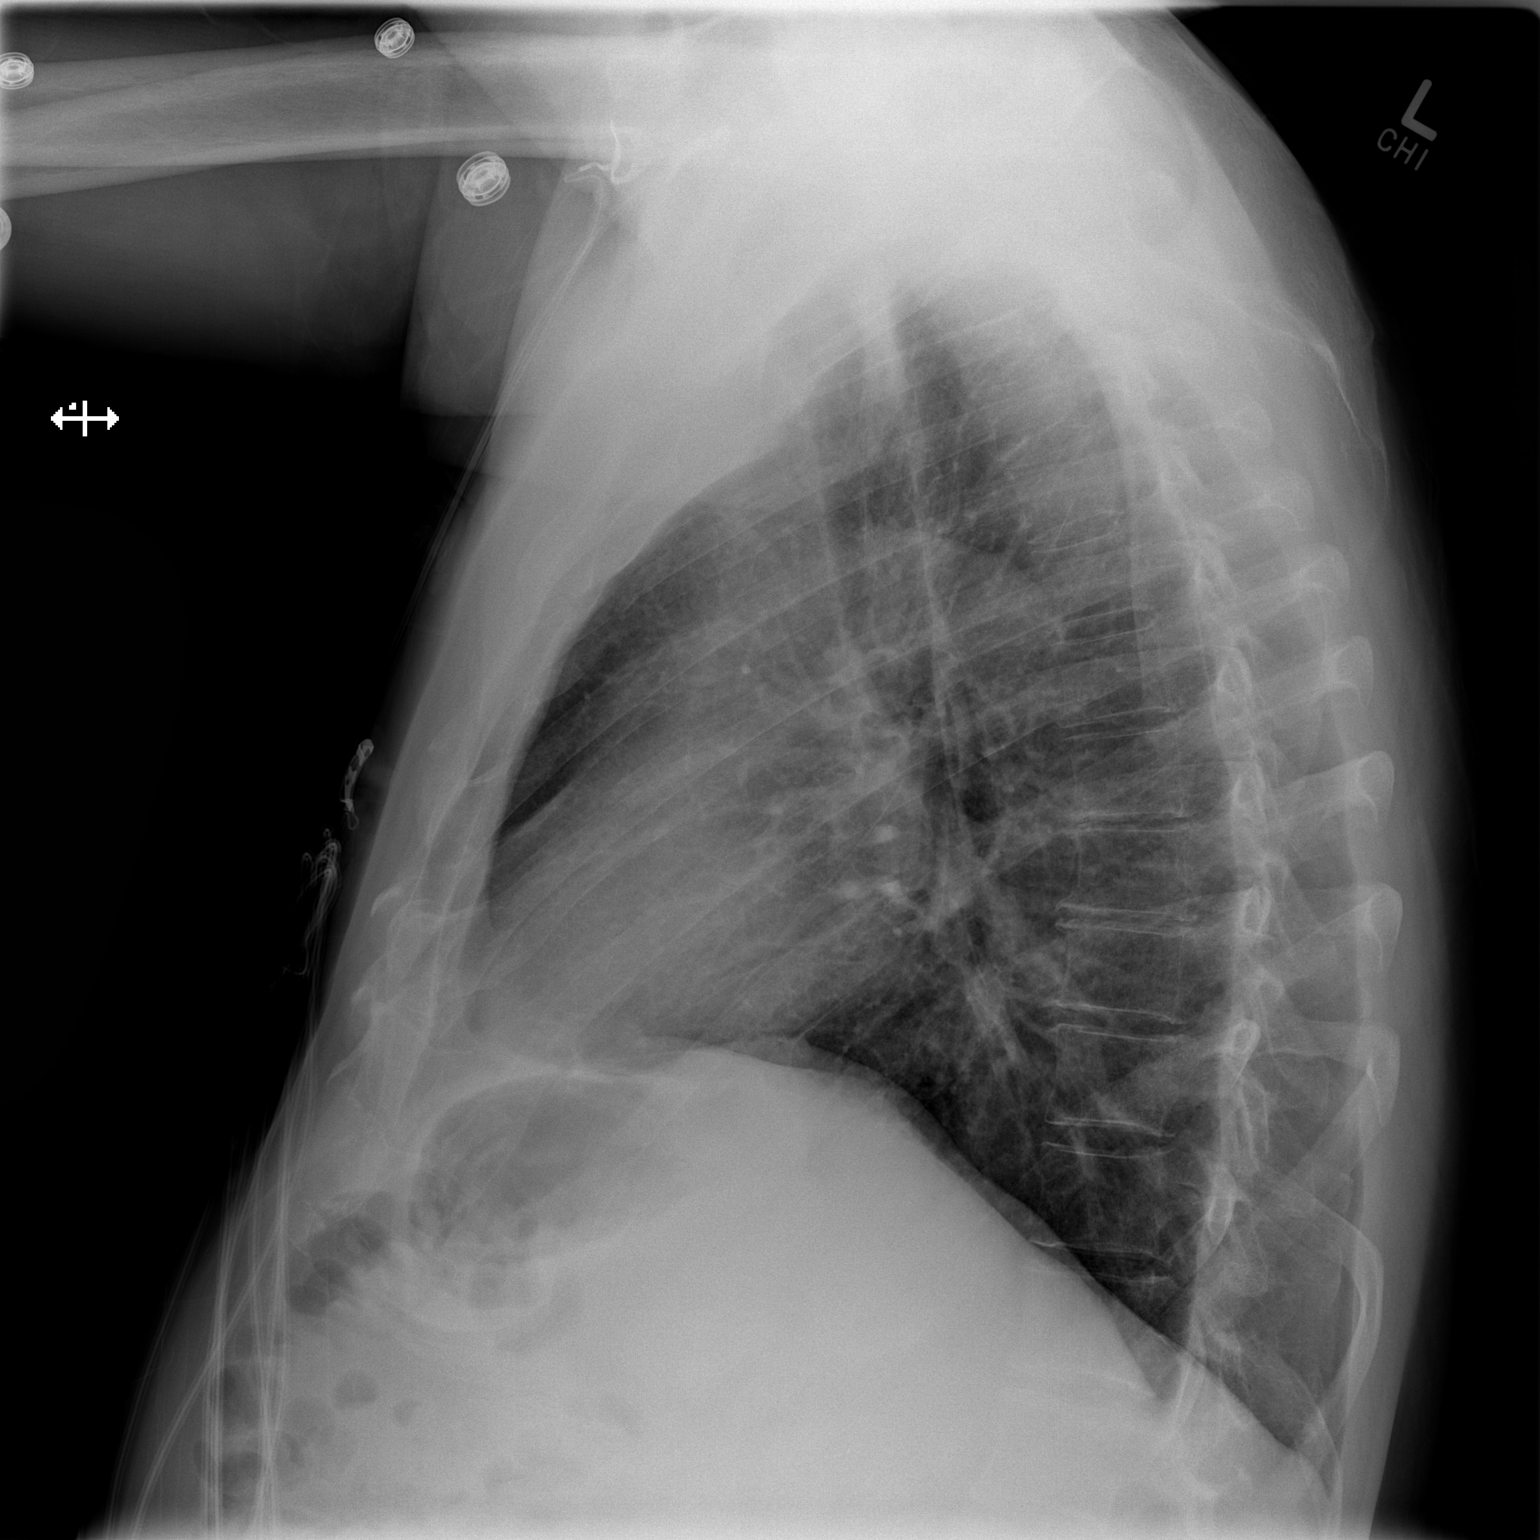

[2 of 2 positions shown; findings below may reference images not displayed]

FINDINGS: Heart size is within normal limits.  Thoracic aorta
contour is normal and stable.  Rounded contour in the mid upper
abdomen/lower mediastinum is favored to be a moderate hiatal hernia
contour and is stable.  The lungs are clear.  Negative for pleural
effusion.  The bones are unremarkable.
IMPRESSION: 1.  No acute cardiopulmonary disease.
2.  Suspect moderate hiatal hernia, stable.

## 2014-07-12 ENCOUNTER — Ambulatory Visit
Admission: RE | Admit: 2014-07-12 | Discharge: 2014-07-12 | Disposition: A | Discharge status: Home / Self Care | Payer: Self-pay | Source: Ambulatory Visit | Attending: Internal Medicine | Admitting: Internal Medicine

## 2014-07-12 ENCOUNTER — Other Ambulatory Visit: Payer: Self-pay | Admitting: Internal Medicine

## 2014-07-12 ENCOUNTER — Ambulatory Visit
Admission: RE | Admit: 2014-07-12 | Discharge: 2014-07-12 | Disposition: A | Discharge status: Home / Self Care | Payer: Medicare Other | Source: Ambulatory Visit | Attending: Internal Medicine | Admitting: Internal Medicine

## 2014-07-12 DIAGNOSIS — R059 Cough, unspecified: Secondary | ICD-10-CM

## 2014-07-12 DIAGNOSIS — R05 Cough: Secondary | ICD-10-CM

## 2015-06-26 ENCOUNTER — Ambulatory Visit (INDEPENDENT_AMBULATORY_CARE_PROVIDER_SITE_OTHER): Payer: PPO | Admitting: Family Medicine

## 2015-06-26 ENCOUNTER — Encounter: Payer: Self-pay | Admitting: Family Medicine

## 2015-06-26 VITALS — BP 93/53 | HR 62 | Temp 97.7°F | Ht 71.0 in | Wt 202.6 lb

## 2015-06-26 DIAGNOSIS — I10 Essential (primary) hypertension: Secondary | ICD-10-CM | POA: Diagnosis not present

## 2015-06-26 DIAGNOSIS — J321 Chronic frontal sinusitis: Secondary | ICD-10-CM | POA: Diagnosis not present

## 2015-06-26 MED ORDER — AMOXICILLIN 500 MG PO CAPS
500.0000 mg | ORAL_CAPSULE | Freq: Three times a day (TID) | ORAL | Status: DC
Start: 2015-06-26 — End: 2015-07-09

## 2015-06-26 MED ORDER — CHLORTHALIDONE 25 MG PO TABS: 12.5000 mg | ORAL_TABLET | Freq: Every day | ORAL | Status: DC

## 2015-06-26 MED ORDER — LORAZEPAM 0.5 MG PO TABS: 0.5000 mg | ORAL_TABLET | Freq: Every day | ORAL | Status: DC
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
## 2015-06-26 NOTE — Patient Instructions (Signed)
Great to meet you!  Stop atenolol Start 1/2 pill chlorthalidone  We will call with results in 1 week or less  Come back in 3-4 weeks

## 2015-06-27 ENCOUNTER — Telehealth: Payer: Self-pay | Admitting: Family Medicine

## 2015-06-27 LAB — LIPID PANEL
Chol/HDL Ratio: 5.1 ratio units — ABNORMAL HIGH (ref 0.0–5.0)
Cholesterol, Total: 178 mg/dL (ref 100–199)
HDL: 35 mg/dL — ABNORMAL LOW (ref >39)
LDL Calculated: 104 mg/dL — ABNORMAL HIGH (ref 0–99)
Triglycerides: 197 mg/dL — ABNORMAL HIGH (ref 0–149)
VLDL Cholesterol Cal: 39 mg/dL (ref 5–40)

## 2015-06-27 LAB — CBC WITH DIFFERENTIAL/PLATELET
Basophils Absolute: 0 10*3/uL (ref 0.0–0.2)
Basos: 0 %
EOS (ABSOLUTE): 0.2 10*3/uL (ref 0.0–0.4)
Eos: 2 %
Hematocrit: 46.1 % (ref 37.5–51.0)
Hemoglobin: 15.8 g/dL (ref 12.6–17.7)
Immature Grans (Abs): 0 10*3/uL (ref 0.0–0.1)
Immature Granulocytes: 0 %
Lymphocytes Absolute: 1.3 10*3/uL (ref 0.7–3.1)
Lymphs: 14 %
MCH: 32 pg (ref 26.6–33.0)
MCHC: 34.3 g/dL (ref 31.5–35.7)
MCV: 94 fL (ref 79–97)
Monocytes Absolute: 0.8 10*3/uL (ref 0.1–0.9)
Monocytes: 9 %
Neutrophils Absolute: 7.2 10*3/uL — ABNORMAL HIGH (ref 1.4–7.0)
Neutrophils: 75 %
Platelets: 151 10*3/uL (ref 150–379)
RBC: 4.93 x10E6/uL (ref 4.14–5.80)
RDW: 14.2 % (ref 12.3–15.4)
WBC: 9.6 10*3/uL (ref 3.4–10.8)

## 2015-06-27 LAB — CMP14+EGFR
ALT: 21 IU/L (ref 0–44)
AST: 21 IU/L (ref 0–40)
Albumin/Globulin Ratio: 2.2 (ref 1.1–2.5)
Albumin: 4.2 g/dL (ref 3.5–4.8)
Alkaline Phosphatase: 59 IU/L (ref 39–117)
BUN/Creatinine Ratio: 10 (ref 10–22)
BUN: 14 mg/dL (ref 8–27)
Bilirubin Total: 0.7 mg/dL (ref 0.0–1.2)
CO2: 24 mmol/L (ref 18–29)
Calcium: 9 mg/dL (ref 8.6–10.2)
Chloride: 103 mmol/L (ref 96–106)
Creatinine, Ser: 1.38 mg/dL — ABNORMAL HIGH (ref 0.76–1.27)
GFR calc Af Amer: 56 mL/min/{1.73_m2} — ABNORMAL LOW (ref >59)
GFR calc non Af Amer: 49 mL/min/{1.73_m2} — ABNORMAL LOW (ref >59)
Globulin, Total: 1.9 g/dL (ref 1.5–4.5)
Glucose: 94 mg/dL (ref 65–99)
Potassium: 4.4 mmol/L (ref 3.5–5.2)
Sodium: 143 mmol/L (ref 134–144)
Total Protein: 6.1 g/dL (ref 6.0–8.5)

## 2015-06-27 MED ORDER — COLCHICINE 0.6 MG PO TABS: ORAL_TABLET | ORAL | Status: DC

## 2015-06-27 MED ORDER — OMEPRAZOLE 40 MG PO CPDR: 40.0000 mg | DELAYED_RELEASE_CAPSULE | Freq: Two times a day (BID) | ORAL | Status: DC

## 2015-06-27 MED ORDER — ATENOLOL 25 MG PO TABS: 25.0000 mg | ORAL_TABLET | Freq: Every day | ORAL | Status: DC

## 2015-06-27 NOTE — Telephone Encounter (Signed)
Called and discussed labs  His Cr Cl is 55, discussed CKD stage 3 Colcrys for gout, His R toe is bothering him.   Also Continue labetalol with previous symptomatic PVCs and the facts that thiazide will worsen gout.  He understands this.   Rfill atenolol and omeprazole  Laroy Apple, MD [facility] 06/27/2015, 5:11 PM

## 2015-07-02 DIAGNOSIS — K219 Gastro-esophageal reflux disease without esophagitis: Secondary | ICD-10-CM | POA: Diagnosis not present

## 2015-07-02 DIAGNOSIS — Z8719 Personal history of other diseases of the digestive system: Secondary | ICD-10-CM | POA: Diagnosis not present

## 2015-07-06 DIAGNOSIS — R1111 Vomiting without nausea: Secondary | ICD-10-CM | POA: Diagnosis not present

## 2015-07-08 ENCOUNTER — Telehealth: Payer: Self-pay | Admitting: Family Medicine

## 2015-07-08 MED ORDER — BENZONATATE 100 MG PO CAPS: 100.0000 mg | ORAL_CAPSULE | Freq: Two times a day (BID) | ORAL | Status: DC | PRN

## 2015-07-08 MED ORDER — ONDANSETRON HCL 4 MG PO TABS: 4.0000 mg | ORAL_TABLET | Freq: Three times a day (TID) | ORAL | Status: DC | PRN

## 2015-07-08 NOTE — Telephone Encounter (Signed)
Patient is not on 2 antibiotics. On Sunday he and his wife started throwing up and diarrhea and since then he has been real nauseated. He wants to know now if he can get phenergan for the nausea.

## 2015-07-08 NOTE — Telephone Encounter (Signed)
Called and discussed with him.  I have called in Zofran. He states he was seen by EMS on Sunday and given a shot for nausea but has not been seen this week.  He states he started taking amoxicillin for coughing up green sputum. He explained that our first visit that he's been given antibiotics by ENT because of his severe sinus infections. I discussed with him at that time that we should be notified when he takes this medication.  It turns out that I don't believe he is having currently a sinus infection. He's complaining of only coughing up green sputum but does not have any fever, chills, shortness of breath, or chest pain.  I recommended not taking the antibiotics for now as I do not believe he has a sinus infection. I have recommended that he be seen if he needs antibiotics at this time as his symptoms are not necessarily consistent with what we are prescribing antibiotics for.  He will call in for an appointment.  Ronald Apple, MD [facility] 07/08/2015, 5:12 PM

## 2015-07-08 NOTE — Addendum Note (Signed)
Addended by: Timmothy Euler on: 07/08/2015 05:13 PM   Modules accepted: Orders

## 2015-07-08 NOTE — Telephone Encounter (Signed)
Will ask which antibiotics, ok with tessalon which I have sent.   Laroy Apple, MD [facility] 07/08/2015, 12:43 PM

## 2015-07-08 NOTE — Telephone Encounter (Signed)
Patient has had a stomach virus and has had a hard time taking antibiotic. Patient advised to try the BRAT diet and to take antibiotic with food to help with nausea. Patient also states that he has a cough and wants to know if you would send in some tessalon pearls for him.

## 2015-07-09 ENCOUNTER — Ambulatory Visit (INDEPENDENT_AMBULATORY_CARE_PROVIDER_SITE_OTHER): Payer: PPO

## 2015-07-09 ENCOUNTER — Ambulatory Visit: Payer: PPO | Admitting: Family Medicine

## 2015-07-09 ENCOUNTER — Ambulatory Visit (INDEPENDENT_AMBULATORY_CARE_PROVIDER_SITE_OTHER): Payer: PPO | Admitting: Family Medicine

## 2015-07-09 ENCOUNTER — Encounter: Payer: Self-pay | Admitting: Family Medicine

## 2015-07-09 ENCOUNTER — Telehealth: Payer: Self-pay | Admitting: Family Medicine

## 2015-07-09 VITALS — BP 152/83 | HR 78 | Temp 98.2°F | Ht 71.0 in | Wt 198.0 lb

## 2015-07-09 DIAGNOSIS — R059 Cough, unspecified: Secondary | ICD-10-CM

## 2015-07-09 DIAGNOSIS — R05 Cough: Secondary | ICD-10-CM | POA: Diagnosis not present

## 2015-07-09 DIAGNOSIS — J218 Acute bronchiolitis due to other specified organisms: Secondary | ICD-10-CM

## 2015-07-09 MED ORDER — PROMETHAZINE HCL 25 MG PO TABS: 25.0000 mg | ORAL_TABLET | Freq: Three times a day (TID) | ORAL | Status: DC | PRN

## 2015-07-09 MED ORDER — LEVOFLOXACIN 500 MG PO TABS: 500.0000 mg | ORAL_TABLET | Freq: Every day | ORAL | Status: DC

## 2015-07-09 NOTE — Telephone Encounter (Signed)
Done

## 2015-07-09 NOTE — Progress Notes (Signed)
Subjective:  Patient ID: MUSSA GROESBECK, male    DOB: 10/05/1934  Age: 81 y.o. MRN: [id number]  CC: URI   HPI AURON TADROS presents for Patient presents with upper respiratory congestion. Rhinorrhea that is frequently purulent. There is moderate sore throat. Patient reports coughing frequently as well.-colored/purulent sputum noted. There is no fever no chills no sweats. The patient denies being short of breath. Onset was 3-5 days ago. Gradually worsening in spite of home remedies.   History Rayjon has a past medical history of Hypertension; GERD (gastroesophageal reflux disease); Seasonal allergies; HOH (hard of hearing); PONV (postoperative nausea and vomiting); Temporal arteritis ([place]); Pneumonia; Chronic bronchitis ([place]); Exertional shortness of breath; History of blood transfusion (1949); Ocular migraine; Bladder cancer ([place]) (2010); Anxiety; Allergy; Cataract; and Clotting disorder ([place]).   He has past surgical history that includes Transurethral resection of bladder tumor (2010 X 3); mastoid tumor removed (Right, 1964); Tonsillectomy (1940's); Skin graft (Right, 1949); Colonoscopy; Vasectomy; Artery Biopsy (Left, 01/09/2013); and Cardiovascular stress test (07/2011).   His family history includes Alzheimer's disease in his father; Cancer in his mother. There is no history of Anemia, Arrhythmia, Asthma, Clotting disorder, Fainting, Heart attack, Heart disease, Heart failure, Hyperlipidemia, or Hypertension.He reports that he has quit smoking. His smoking use included Cigarettes. He has a 2.25 pack-year smoking history. He has never used smokeless tobacco. He reports that he does not drink alcohol or use illicit drugs.  Current Outpatient Prescriptions on File Prior to Visit  Medication Sig Dispense Refill  . aspirin EC 81 MG tablet Take 81 mg by mouth daily.    Marland Kitchen atenolol (TENORMIN) 25 MG tablet Take 1 tablet (25 mg total) by mouth daily. 90 tablet 3  . benzonatate (TESSALON) 100 MG  capsule Take 1 capsule (100 mg total) by mouth 2 (two) times daily as needed for cough. 20 capsule 0  . fluocinonide cream (LIDEX) 0.05 % APPLY TOPICALLY 2 (TWO) TIMES DAILY.  1  . LORazepam (ATIVAN) 0.5 MG tablet Take 1 tablet (0.5 mg total) by mouth at bedtime. 30 tablet 2  . omeprazole (PRILOSEC) 40 MG capsule Take 1 capsule (40 mg total) by mouth 2 (two) times daily. 180 capsule 3  . vitamin E 400 UNIT capsule Take 400 Units by mouth daily.    . colchicine 0.6 MG tablet Take 2 at the first sign of gout flare, then 1 more 1 hour later. Then take 1 pill for three days after that (Patient not taking: Reported on 07/09/2015) 12 tablet 0  . [DISCONTINUED] budesonide-formoterol (SYMBICORT) 80-4.5 MCG/ACT inhaler Inhale 2 puffs into the lungs 2 (two) times daily.    . [DISCONTINUED] diphenhydrAMINE (SOMINEX) 25 MG tablet Take 1 tablet (25 mg total) by mouth at bedtime as needed for allergies or sleep. 30 tablet   . [DISCONTINUED] fluticasone (FLONASE) 50 MCG/ACT nasal spray Place 2 sprays into the nose 2 (two) times daily.     No current facility-administered medications on file prior to visit.    ROS Review of Systems  Constitutional: Negative for fever, chills, diaphoresis and unexpected weight change.  HENT: Negative for congestion, hearing loss, rhinorrhea and sore throat.   Eyes: Negative for visual disturbance.  Respiratory: Negative for cough and shortness of breath.   Cardiovascular: Negative for chest pain.  Gastrointestinal: Negative for abdominal pain, diarrhea and constipation.  Genitourinary: Negative for dysuria and flank pain.  Musculoskeletal: Negative for joint swelling and arthralgias.  Skin: Negative for rash.  Neurological: Negative for dizziness and  headaches.  Psychiatric/Behavioral: Negative for sleep disturbance and dysphoric mood.    Objective:  BP 152/83 mmHg  Pulse 78  Temp(Src) 98.2 F (36.8 C) (Oral)  Ht '5\' 11"'$  (1.803 m)  Wt 198 lb (89.812 kg)  BMI 27.63  kg/m2  SpO2 97%  Physical Exam  Constitutional: He is oriented to person, place, and time. He appears well-developed and well-nourished. No distress.  HENT:  Head: Normocephalic and atraumatic.  Right Ear: External ear normal.  Left Ear: External ear normal.  Nose: Nose normal.  Mouth/Throat: Oropharynx is clear and moist.  Eyes: Conjunctivae and EOM are normal. Pupils are equal, round, and reactive to light.  Neck: Normal range of motion. Neck supple. No thyromegaly present.  Cardiovascular: Normal rate, regular rhythm and normal heart sounds.   No murmur heard. Pulmonary/Chest: Effort normal and breath sounds normal. No respiratory distress. He has no wheezes. He has no rales.  Abdominal: Soft. Bowel sounds are normal. He exhibits no distension. There is no tenderness.  Lymphadenopathy:    He has no cervical adenopathy.  Neurological: He is alert and oriented to person, place, and time. He has normal reflexes.  Skin: Skin is warm and dry.  Psychiatric: He has a normal mood and affect. His behavior is normal. Judgment and thought content normal.    Assessment & Plan:   Travus was seen today for uri.  Diagnoses and all orders for this visit:  Cough -     DG Chest 2 View; Future -     CBC with Differential/Platelet -     CMP14+EGFR  Acute bronchiolitis due to other specified organisms  Other orders -     levofloxacin (LEVAQUIN) 500 MG tablet; Take 1 tablet (500 mg total) by mouth daily.   I have discontinued Mr. Gornick vitamin B-12 and amoxicillin. I am also having him start on levofloxacin. Additionally, I am having him maintain his aspirin EC, vitamin E, fluocinonide cream, LORazepam, colchicine, atenolol, omeprazole, and benzonatate.  Meds ordered this encounter  Medications  . levofloxacin (LEVAQUIN) 500 MG tablet    Sig: Take 1 tablet (500 mg total) by mouth daily.    Dispense:  10 tablet    Refill:  0     Follow-up: Return if symptoms worsen or fail to  improve.  Claretta Fraise, M.D.

## 2015-07-09 NOTE — Telephone Encounter (Signed)
Pt notified of RX 

## 2015-07-10 LAB — CMP14+EGFR
ALT: 20 IU/L (ref 0–44)
AST: 24 IU/L (ref 0–40)
Albumin/Globulin Ratio: 2.2 (ref 1.1–2.5)
Albumin: 4.4 g/dL (ref 3.5–4.8)
Alkaline Phosphatase: 57 IU/L (ref 39–117)
BUN/Creatinine Ratio: 12 (ref 10–22)
BUN: 15 mg/dL (ref 8–27)
Bilirubin Total: 1.1 mg/dL (ref 0.0–1.2)
CO2: 18 mmol/L (ref 18–29)
Calcium: 8.9 mg/dL (ref 8.6–10.2)
Chloride: 100 mmol/L (ref 96–106)
Creatinine, Ser: 1.3 mg/dL — ABNORMAL HIGH (ref 0.76–1.27)
GFR calc Af Amer: 60 mL/min/{1.73_m2} (ref >59)
GFR calc non Af Amer: 52 mL/min/{1.73_m2} — ABNORMAL LOW (ref >59)
Globulin, Total: 2 g/dL (ref 1.5–4.5)
Glucose: 94 mg/dL (ref 65–99)
Potassium: 3.5 mmol/L (ref 3.5–5.2)
Sodium: 140 mmol/L (ref 134–144)
Total Protein: 6.4 g/dL (ref 6.0–8.5)

## 2015-07-10 LAB — CBC WITH DIFFERENTIAL/PLATELET
Basophils Absolute: 0 10*3/uL (ref 0.0–0.2)
Basos: 0 %
EOS (ABSOLUTE): 0.3 10*3/uL (ref 0.0–0.4)
Eos: 4 %
Hematocrit: 46.2 % (ref 37.5–51.0)
Hemoglobin: 16 g/dL (ref 12.6–17.7)
Immature Grans (Abs): 0 10*3/uL (ref 0.0–0.1)
Immature Granulocytes: 0 %
Lymphocytes Absolute: 1.1 10*3/uL (ref 0.7–3.1)
Lymphs: 13 %
MCH: 31.9 pg (ref 26.6–33.0)
MCHC: 34.6 g/dL (ref 31.5–35.7)
MCV: 92 fL (ref 79–97)
Monocytes Absolute: 1 10*3/uL — ABNORMAL HIGH (ref 0.1–0.9)
Monocytes: 13 %
Neutrophils Absolute: 5.7 10*3/uL (ref 1.4–7.0)
Neutrophils: 70 %
Platelets: 134 10*3/uL — ABNORMAL LOW (ref 150–379)
RBC: 5.02 x10E6/uL (ref 4.14–5.80)
RDW: 13.8 % (ref 12.3–15.4)
WBC: 8.2 10*3/uL (ref 3.4–10.8)

## 2015-07-12 ENCOUNTER — Telehealth: Payer: Self-pay | Admitting: Family Medicine

## 2015-07-12 NOTE — Telephone Encounter (Signed)
Patient taking tessalon for bronchitis and not helping with cough is there anything else

## 2015-07-12 NOTE — Telephone Encounter (Signed)
Was yold to use delsym OTC

## 2015-07-17 ENCOUNTER — Ambulatory Visit (INDEPENDENT_AMBULATORY_CARE_PROVIDER_SITE_OTHER): Payer: PPO | Admitting: Family Medicine

## 2015-07-17 ENCOUNTER — Encounter: Payer: Self-pay | Admitting: Family Medicine

## 2015-07-17 VITALS — BP 126/68 | HR 63 | Temp 97.2°F | Ht 71.0 in | Wt 198.8 lb

## 2015-07-17 DIAGNOSIS — J209 Acute bronchitis, unspecified: Secondary | ICD-10-CM

## 2015-07-17 MED ORDER — METHYLPREDNISOLONE ACETATE 80 MG/ML IJ SUSP
80.0000 mg | Freq: Once | INTRAMUSCULAR | Status: AC
  Administered 2015-07-17: 40 mg via INTRAMUSCULAR

## 2015-07-17 NOTE — Patient Instructions (Signed)
Great to see you!  Lets see you back in 2 months for routine follow up .

## 2015-07-17 NOTE — Progress Notes (Signed)
   HPI  Patient presents today for follow-up from recent acute visit.  Patient was seen here one week ago and treated for acute bronchitis with Levaquin. Is a history of recurrent sinus infection intake and a partial course of amoxicillin prior to this time. Explains that he feels better overall, many of his symptoms of resolved, however he has persistent cough which she is concerned will not resolve quickly.  He has no dyspnea, chest pain, leg edema.  His previous leg lesion that we discussed has already healed.  His gout flare improved rapidly with colchicine.  PMH: Smoking status noted ROS: Per HPI  Objective: BP 126/68 mmHg  Pulse 63  Temp(Src) 97.2 F (36.2 C) (Oral)  Ht '5\' 11"'$  (1.803 m)  Wt 198 lb 12.8 oz (90.175 kg)  BMI 27.74 kg/m2 Gen: NAD, alert, cooperative with exam HEENT: NCAT CV: RRR, good S1/S2, no murmur Resp: CTABL, no wheezes, non-labored Ext: No edema, warm Neuro: Alert and oriented, No gross deficits  Assessment and plan:  # Acute Bronchitis Continued cough, he is nearly completed his Levaquin course. I'm satisfied that he's had good antibiotic coverage, I will go ahead and give him a shot of IM depo medrol (40 mg) If he has persistent cough over the next 4-5 days out consider a short course of inhaled corticosteroid, Breo, we have samples of here in [facility].   Laroy Apple, MD [facility] 07/17/2015, 2:13 PM

## 2015-07-22 ENCOUNTER — Encounter: Payer: Self-pay | Admitting: *Deleted

## 2015-07-24 ENCOUNTER — Ambulatory Visit (INDEPENDENT_AMBULATORY_CARE_PROVIDER_SITE_OTHER): Payer: PPO | Admitting: Nurse Practitioner

## 2015-07-24 ENCOUNTER — Encounter: Payer: Self-pay | Admitting: Nurse Practitioner

## 2015-07-24 VITALS — BP 124/70 | HR 63 | Temp 96.9°F | Ht 71.0 in | Wt 199.0 lb

## 2015-07-24 DIAGNOSIS — J0101 Acute recurrent maxillary sinusitis: Secondary | ICD-10-CM | POA: Diagnosis not present

## 2015-07-24 MED ORDER — AZITHROMYCIN 250 MG PO TABS: ORAL_TABLET | ORAL | Status: DC

## 2015-07-24 MED ORDER — BENZONATATE 100 MG PO CAPS: 100.0000 mg | ORAL_CAPSULE | Freq: Three times a day (TID) | ORAL | Status: DC | PRN

## 2015-07-24 NOTE — Patient Instructions (Signed)

## 2015-07-24 NOTE — Progress Notes (Signed)
   Subjective:    Patient ID: Ronald Russell, male    DOB: [birth year], 81 y.o.   MRN: [id number]  HPI  Patient in today c/o cough and congestion- that started 2 days ago- no fever- slight dizziness.    Review of Systems  Constitutional: Negative.   HENT: Positive for congestion and sinus pressure. Negative for ear pain and voice change.   Respiratory: Negative.   Cardiovascular: Negative.   Genitourinary: Negative.   Neurological: Negative.   Psychiatric/Behavioral: Negative.   All other systems reviewed and are negative.      Objective:   Physical Exam  Constitutional: He appears well-developed and well-nourished.  HENT:  Right Ear: Hearing, tympanic membrane, external ear and ear canal normal.  Left Ear: Hearing, tympanic membrane, external ear and ear canal normal.  Nose: Mucosal edema and rhinorrhea present. Right sinus exhibits maxillary sinus tenderness. Left sinus exhibits maxillary sinus tenderness.  Mouth/Throat: Uvula is midline, oropharynx is clear and moist and mucous membranes are normal.    BP 124/70 mmHg  Pulse 63  Temp(Src) 96.9 F (36.1 C) (Oral)  Ht '5\' 11"'$  (1.803 m)  Wt 199 lb (90.266 kg)  BMI 27.77 kg/m2      Assessment & Plan:   1. Acute recurrent maxillary sinusitis    Meds ordered this encounter  Medications  . azithromycin (ZITHROMAX Z-PAK) 250 MG tablet    Sig: As directed    Dispense:  1 each    Refill:  0    Order Specific Question:  Supervising Provider    Answer:  Chipper Herb [[id number]]  . benzonatate (TESSALON PERLES) 100 MG capsule    Sig: Take 1 capsule (100 mg total) by mouth 3 (three) times daily as needed for cough.    Dispense:  20 capsule    Refill:  0    Order Specific Question:  Supervising Provider    Answer:  Chipper Herb [[id number]]   1. Take meds as prescribed 2. Use a cool mist humidifier especially during the winter months and when heat has been humid. 3. Use saline nose sprays frequently 4. Saline irrigations of  the nose can be very helpful if done frequently.  * 4X daily for 1 week*  * Use of a nettie pot can be helpful with this. Follow directions with this* 5. Drink plenty of fluids 6. Keep thermostat turn down low 7.For any cough or congestion  Use plain Mucinex- regular strength or max strength is fine   * Children- consult with Pharmacist for dosing 8. For fever or aces or pains- take tylenol or ibuprofen appropriate for age and weight.  * for fevers greater than 101 orally you may alternate ibuprofen and tylenol every  3 hours.   Mary-Margaret Hassell Done, FNP

## 2015-08-06 DIAGNOSIS — K317 Polyp of stomach and duodenum: Secondary | ICD-10-CM | POA: Diagnosis not present

## 2015-08-06 DIAGNOSIS — K219 Gastro-esophageal reflux disease without esophagitis: Secondary | ICD-10-CM | POA: Diagnosis not present

## 2015-08-06 DIAGNOSIS — K208 Other esophagitis: Secondary | ICD-10-CM | POA: Diagnosis not present

## 2015-08-06 DIAGNOSIS — K228 Other specified diseases of esophagus: Secondary | ICD-10-CM | POA: Diagnosis not present

## 2015-08-19 ENCOUNTER — Ambulatory Visit (INDEPENDENT_AMBULATORY_CARE_PROVIDER_SITE_OTHER): Payer: PPO | Admitting: Family Medicine

## 2015-08-19 ENCOUNTER — Encounter: Payer: Self-pay | Admitting: Family Medicine

## 2015-08-19 VITALS — BP 124/72 | HR 58 | Ht 71.0 in | Wt 201.2 lb

## 2015-08-19 DIAGNOSIS — L200 Besnier's prurigo: Secondary | ICD-10-CM | POA: Diagnosis not present

## 2015-08-19 DIAGNOSIS — L239 Allergic contact dermatitis, unspecified cause: Secondary | ICD-10-CM

## 2015-08-19 DIAGNOSIS — D849 Immunodeficiency, unspecified: Secondary | ICD-10-CM | POA: Diagnosis not present

## 2015-08-19 DIAGNOSIS — R21 Rash and other nonspecific skin eruption: Secondary | ICD-10-CM

## 2015-08-19 DIAGNOSIS — L509 Urticaria, unspecified: Secondary | ICD-10-CM | POA: Diagnosis not present

## 2015-08-19 MED ORDER — FLUOCINONIDE-E 0.05 % EX CREA: TOPICAL_CREAM | Freq: Two times a day (BID) | CUTANEOUS | Status: DC

## 2015-08-19 MED ORDER — BETAMETHASONE SOD PHOS & ACET 6 (3-3) MG/ML IJ SUSP
6.0000 mg | Freq: Once | INTRAMUSCULAR | Status: AC
  Administered 2015-08-19: 6 mg via INTRAMUSCULAR

## 2015-08-19 NOTE — Progress Notes (Signed)
Subjective:  Patient ID: Ronald Russell, male    DOB: April 09, 1935  Age: 81 y.o. MRN: [id number]  CC: Rash   HPI [name] presents for intermittent eruption on the scalp shoulders and or so off and on for a half. It is highly pruritic. The patient states that his immune system has been shot ever since he had treatment for bladder cancer. Currently the rash is affecting the upper aspect of the left shoulder posteriorly and the right axilla. There is one area on the posterior scalp and a few scattered areas on the trunk. He states these come and go randomly. He uses triamcinolone on them. She has used Lidex in the past. He would like to have a refill of one or the other. He denies any systemic symptoms of allergy including anaphylactic changes swelling in the tongue palpitations jittery nervous sensation or dyspnea.   History Ronald Russell has a past medical history of Hypertension; GERD (gastroesophageal reflux disease); Seasonal allergies; HOH (hard of hearing); PONV (postoperative nausea and vomiting); Temporal arteritis (Ronald Russell); Pneumonia; Chronic bronchitis (Ronald Russell); Exertional shortness of breath; History of blood transfusion (1949); Ocular migraine; Bladder cancer (Ronald Russell) (2010); Anxiety; Allergy; Cataract; and Clotting disorder (Ronald Russell).   He has past surgical history that includes Transurethral resection of bladder tumor (2010 X 3); mastoid tumor removed (Right, 1964); Tonsillectomy (1940's); Skin graft (Right, 1949); Colonoscopy; Vasectomy; Artery Biopsy (Left, 01/09/2013); and Cardiovascular stress test (07/2011).   His family history includes Alzheimer's disease in his father; Cancer in his mother. There is no history of Anemia, Arrhythmia, Asthma, Clotting disorder, Fainting, Heart attack, Heart disease, Heart failure, Hyperlipidemia, or Hypertension.He reports that he has quit smoking. His smoking use included Cigarettes. He has a 2.25 pack-year smoking history. He has never used smokeless tobacco. He  reports that he does not drink alcohol or use illicit drugs.    ROS Review of Systems  Constitutional: Negative for fever, chills and diaphoresis.  HENT: Negative for rhinorrhea and sore throat.   Respiratory: Negative for cough and shortness of breath.   Cardiovascular: Negative for chest pain.  Gastrointestinal: Negative for abdominal pain.  Musculoskeletal: Negative for myalgias and arthralgias.  Skin: Negative for rash.  Neurological: Negative for weakness and headaches.    Objective:  BP 124/72 mmHg  Pulse 58  Ht '5\' 11"'$  (1.803 m)  Wt 201 lb 4 oz (91.286 kg)  BMI 28.08 kg/m2  BP Readings from Last 3 Encounters:  08/19/15 124/72  07/24/15 124/70  07/17/15 126/68    Wt Readings from Last 3 Encounters:  08/19/15 201 lb 4 oz (91.286 kg)  07/24/15 199 lb (90.266 kg)  07/17/15 198 lb 12.8 oz (90.175 kg)     Physical Exam  Constitutional: He appears well-developed and well-nourished.  HENT:  Head: Normocephalic and atraumatic.  Right Ear: Tympanic membrane and external ear normal. No decreased hearing is noted.  Left Ear: Tympanic membrane and external ear normal. No decreased hearing is noted.  Mouth/Throat: No oropharyngeal exudate or posterior oropharyngeal erythema.  Eyes: Pupils are equal, round, and reactive to light.  Neck: Normal range of motion. Neck supple.  Cardiovascular: Normal rate and regular rhythm.   No murmur heard. Pulmonary/Chest: Breath sounds normal. No respiratory distress.  Abdominal: Soft. Bowel sounds are normal. He exhibits no mass. There is no tenderness.  Skin: Skin is warm and dry. Rash (faint erythema of the posterior superior border of the left trapezius with blanching and raised wheals. There is a significant amount of wheal formation  around the right axilla. 1 wheall noted at the posterior scalp and a few scattered over the trunk.) noted.  Vitals reviewed.    Lab Results  Component Value Date   WBC 8.2 07/09/2015   HGB 13.8  02/14/2013   HCT 46.2 07/09/2015   PLT 134* 07/09/2015   GLUCOSE 94 07/09/2015   CHOL 178 06/26/2015   TRIG 197* 06/26/2015   HDL 35* 06/26/2015   LDLCALC 104* 06/26/2015   ALT 20 07/09/2015   AST 24 07/09/2015   NA 140 07/09/2015   K 3.5 07/09/2015   CL 100 07/09/2015   CREATININE 1.30* 07/09/2015   BUN 15 07/09/2015   CO2 18 07/09/2015   TSH 4.093 02/13/2013   INR 0.91 07/31/2011   HGBA1C 5.2 02/13/2013    Dg Chest 2 View  07/12/2014  CLINICAL DATA:  Three-month history of cough and congestion. History of bladder carcinoma EXAM: CHEST  2 VIEW COMPARISON:  February 13, 2013 FINDINGS: There is no edema or consolidation. The heart is upper normal in size with pulmonary vascularity within normal limits. No adenopathy. No bone lesions. There is a hiatal hernia. IMPRESSION: Hiatal hernia.  No edema or consolidation. Electronically Signed   By: Lowella Grip M.D.   On: 07/12/2014 13:45    Assessment & Plan:   [name] was seen today for rash.  Diagnoses and all orders for this visit:  Rash and nonspecific skin eruption -     betamethasone acetate-betamethasone sodium phosphate (CELESTONE) injection 6 mg; Inject 1 mL (6 mg total) into the muscle once. -     Ambulatory referral to Allergy  Allergic dermatitis -     Ambulatory referral to Allergy  Urticaria of unknown origin -     Ambulatory referral to Allergy  Immune deficiency disorder (Ronald Russell)  Other orders -     fluocinonide-emollient (LIDEX-E) 0.05 % cream; Apply topically 2 (two) times daily.      I have discontinued Ronald. Russell azithromycin and benzonatate. I have also changed his fluocinonide cream to fluocinonide-emollient. Additionally, I am having him maintain his aspirin EC, vitamin E, LORazepam, atenolol, and omeprazole. We administered betamethasone acetate-betamethasone sodium phosphate.  Meds ordered this encounter  Medications  . betamethasone acetate-betamethasone sodium phosphate (CELESTONE) injection  6 mg    Sig:   . fluocinonide-emollient (LIDEX-E) 0.05 % cream    Sig: Apply topically 2 (two) times daily.    Dispense:  60 g    Refill:  5     Follow-up: Return if symptoms worsen or fail to improve.  Claretta Fraise, M.D.

## 2015-09-10 ENCOUNTER — Telehealth: Payer: Self-pay | Admitting: Family Medicine

## 2015-09-10 MED ORDER — TRIAMCINOLONE ACETONIDE 0.1 % EX CREA: 1.0000 "application " | TOPICAL_CREAM | Freq: Two times a day (BID) | CUTANEOUS | Status: DC

## 2015-09-10 NOTE — Telephone Encounter (Signed)
Pt notified of RX Verbalizes understanding 

## 2015-09-10 NOTE — Telephone Encounter (Signed)
The requested med has been sent to the pharmacy.  Please let the patient know. Thanks, WS 

## 2015-09-29 DIAGNOSIS — H40033 Anatomical narrow angle, bilateral: Secondary | ICD-10-CM | POA: Diagnosis not present

## 2015-09-29 DIAGNOSIS — H578 Other specified disorders of eye and adnexa: Secondary | ICD-10-CM | POA: Diagnosis not present

## 2015-09-30 ENCOUNTER — Ambulatory Visit (INDEPENDENT_AMBULATORY_CARE_PROVIDER_SITE_OTHER): Payer: PPO | Admitting: Family Medicine

## 2015-09-30 ENCOUNTER — Encounter: Payer: Self-pay | Admitting: Family Medicine

## 2015-09-30 ENCOUNTER — Ambulatory Visit (INDEPENDENT_AMBULATORY_CARE_PROVIDER_SITE_OTHER): Payer: PPO

## 2015-09-30 VITALS — BP 152/77 | HR 62 | Temp 97.3°F | Ht 71.0 in | Wt 200.6 lb

## 2015-09-30 DIAGNOSIS — R05 Cough: Secondary | ICD-10-CM | POA: Diagnosis not present

## 2015-09-30 DIAGNOSIS — H8111 Benign paroxysmal vertigo, right ear: Secondary | ICD-10-CM

## 2015-09-30 DIAGNOSIS — R059 Cough, unspecified: Secondary | ICD-10-CM

## 2015-09-30 DIAGNOSIS — R6883 Chills (without fever): Secondary | ICD-10-CM | POA: Diagnosis not present

## 2015-09-30 DIAGNOSIS — H8110 Benign paroxysmal vertigo, unspecified ear: Secondary | ICD-10-CM | POA: Insufficient documentation

## 2015-09-30 LAB — VERITOR FLU A/B WAIVED
Influenza A: NEGATIVE
Influenza B: NEGATIVE

## 2015-09-30 NOTE — Progress Notes (Signed)
   HPI  Patient presents today here with acute illness.  Patient's lines of the last week or so he's had headache, nasal congestion, and dizziness. He describes his dizziness as a room spinning sensation when looking to R or fast head movements  He denies any a sharp pressure. He has   long-standing left-sided head pain in L temple area, Hx of temporal arteritis He's had several recent illnesses including acute bronchitis treated with Levaquin in January 25 February 2 bronchitis, Depo-Medrol February 9 , Acute sinusitis treated with azithromycin  March 7 urticaria and rash,- IM [facility]  Today requesting antibiotics  PMH: Smoking status noted ROS: Per HPI  Objective: BP 152/77 mmHg  Pulse 62  Temp(Src) 97.3 F (36.3 C) (Oral)  Ht '5\' 11"'$  (1.803 m)  Wt 200 lb 9.6 oz (90.992 kg)  BMI 27.99 kg/m2 Gen: NAD, alert, cooperative with exam HEENT: NCAT, L TM WNL, R TM unable to be seen  Due to narrow canal, no facial pressure to palp of sinuses CV: RRR, good S1/S2, no murmur Resp: CTABL, no wheezes, non-labored Ext: No edema, warm Neuro: reproduction of dizziness with turning head to right in epley's maneuvers  CXR- clear, no acute  Assessment and plan:  # BPPV No signs of infection, CXR clear Epleys on R performed in clinic and handout given Low threshold for return, if he has nay worsening or failure to improve would recommend return to clinic.     Orders Placed This Encounter  Procedures  . Veritor Flu A/B Waived    Order Specific Question:  Source    Answer:  nose  . DG Chest 2 View    Standing Status: Future     Number of Occurrences:      Standing Expiration Date: 11/29/2016    Order Specific Question:  Reason for Exam (SYMPTOM  OR DIAGNOSIS REQUIRED)    Answer:  eval for CAP    Order Specific Question:  Preferred imaging location?    Answer:  External     Laroy Apple, MD [facility] 09/30/2015, 2:17 PM

## 2015-09-30 NOTE — Patient Instructions (Signed)
Great to see you!  Try the exercises, if you have any additional issues or don't get better please let us know  Vertigo Vertigo means you feel like you or your surroundings are moving when they are not. Vertigo can be dangerous if it occurs when you are at work, driving, or performing difficult activities.  CAUSES  Vertigo occurs when there is a conflict of signals sent to your brain from the visual and sensory systems in your body. There are many different causes of vertigo, including:  Infections, especially in the inner ear.  A bad reaction to a drug or misuse of alcohol and medicines.  Withdrawal from drugs or alcohol.  Rapidly changing positions, such as lying down or rolling over in bed.  A migraine headache.  Decreased blood flow to the brain.  Increased pressure in the brain from a head injury, infection, tumor, or bleeding. SYMPTOMS  You may feel as though the world is spinning around or you are falling to the ground. Because your balance is upset, vertigo can cause nausea and vomiting. You may have involuntary eye movements (nystagmus). DIAGNOSIS  Vertigo is usually diagnosed by physical exam. If the cause of your vertigo is unknown, your caregiver may perform imaging tests, such as an MRI scan (magnetic resonance imaging). TREATMENT  Most cases of vertigo resolve on their own, without treatment. Depending on the cause, your caregiver may prescribe certain medicines. If your vertigo is related to body position issues, your caregiver may recommend movements or procedures to correct the problem. In rare cases, if your vertigo is caused by certain inner ear problems, you may need surgery. HOME CARE INSTRUCTIONS   Follow your caregiver's instructions.  Avoid driving.  Avoid operating heavy machinery.  Avoid performing any tasks that would be dangerous to you or others during a vertigo episode.  Tell your caregiver if you notice that certain medicines seem to be causing  your vertigo. Some of the medicines used to treat vertigo episodes can actually make them worse in some people. SEEK IMMEDIATE MEDICAL CARE IF:   Your medicines do not relieve your vertigo or are making it worse.  You develop problems with talking, walking, weakness, or using your arms, hands, or legs.  You develop severe headaches.  Your nausea or vomiting continues or gets worse.  You develop visual changes.  A family member notices behavioral changes.  Your condition gets worse. MAKE SURE YOU:  Understand these instructions.  Will watch your condition.  Will get help right away if you are not doing well or get worse.   This information is not intended to replace advice given to you by your health care provider. Make sure you discuss any questions you have with your health care provider.   Document Released: 03/10/2005 Document Revised: 08/23/2011 Document Reviewed: 09/23/2014 Elsevier Interactive Patient Education [employer].

## 2015-10-05 DIAGNOSIS — I889 Nonspecific lymphadenitis, unspecified: Secondary | ICD-10-CM | POA: Diagnosis not present

## 2015-10-05 DIAGNOSIS — T148 Other injury of unspecified body region: Secondary | ICD-10-CM | POA: Diagnosis not present

## 2015-10-06 ENCOUNTER — Telehealth: Payer: Self-pay | Admitting: Family Medicine

## 2015-10-06 NOTE — Telephone Encounter (Signed)
Spoke to pt and he had pulled the tick off of genital area and was seen at the Urgent Care in [place]. They gave him doxycycline and his inguinal area is swollen. He did take one last night and one this morning. I told the pt that the benefit of taking the antibiotic in this situation outweighed the risks. I also told him you were out of the office today but would return tomorrow. Pt was advised if you wanted him to discontinue the antibiotic or do any other form of treatment we would call him back. Are you ok with him continuing the antibiotic? Would you like him to follow up in 2 weeks or so for bloodwork? Please advise.

## 2015-10-07 NOTE — Telephone Encounter (Signed)
Pt is aware.  

## 2015-10-07 NOTE — Telephone Encounter (Signed)
Ok to take antibiotic. Would not advise lab work unless he is ill or doesn't get better.   I am trying to limit his antibiotic use in general but if another provider thought he needed it I would not stop him.   Laroy Apple, MD [facility] 10/07/2015, 7:37 AM

## 2015-10-09 ENCOUNTER — Ambulatory Visit (INDEPENDENT_AMBULATORY_CARE_PROVIDER_SITE_OTHER): Payer: PPO | Admitting: Nurse Practitioner

## 2015-10-09 ENCOUNTER — Encounter: Payer: Self-pay | Admitting: Nurse Practitioner

## 2015-10-09 VITALS — BP 124/69 | HR 61 | Temp 97.2°F | Ht 71.0 in | Wt 200.6 lb

## 2015-10-09 DIAGNOSIS — H539 Unspecified visual disturbance: Secondary | ICD-10-CM

## 2015-10-09 DIAGNOSIS — R51 Headache: Secondary | ICD-10-CM | POA: Diagnosis not present

## 2015-10-09 DIAGNOSIS — R519 Headache, unspecified: Secondary | ICD-10-CM

## 2015-10-09 NOTE — Progress Notes (Signed)
   Subjective:    Patient ID: Ronald Russell, male    DOB: 1935-06-02, 81 y.o.   MRN: [id number]  HPI Patient in today c/o a bad pain in left temple- started a week ago intermittently but got bad around 1-2 this afternoon 10/10- throbbing pian- he took 2 tylenol and pain has now gone down to 2/10. He is have blurred vision, almost like his glasses need to be changed. This happened last week and Dr. [name] thought it was just vertigo.This has been going on intermittently for 3-4 years- saw ENT and they did temporal artey bx which was negative for temporal arteritis.    Review of Systems  Constitutional: Negative.   HENT: Negative.   Respiratory: Negative.   Cardiovascular: Negative.   Genitourinary: Negative.   Neurological: Negative.   Psychiatric/Behavioral: Negative.   All other systems reviewed and are negative.       Objective:   Physical Exam  Constitutional: He is oriented to person, place, and time. He appears well-developed and well-nourished.  Eyes: Pupils are equal, round, and reactive to light.  mildly blurred vision  Cardiovascular: Normal rate, regular rhythm and normal heart sounds.   Pulmonary/Chest: Effort normal and breath sounds normal.  Neurological: He is alert and oriented to person, place, and time. He has normal reflexes.  Skin: Skin is warm and dry.  Pain on palpation along left temporal artery  Psychiatric: He has a normal mood and affect. His behavior is normal. Judgment and thought content normal.    BP 124/69 mmHg  Pulse 61  Temp(Src) 97.2 F (36.2 C) (Oral)  Ht '5\' 11"'$  (1.803 m)  Wt 200 lb 9.6 oz (90.992 kg)  BMI 27.99 kg/m2       Assessment & Plan:   1. Temporal pain   2. Visual disturbance    Orders Placed This Encounter  Procedures  . CT Head W Contrast    Standing Status: Future     Number of Occurrences:      Standing Expiration Date: 01/07/2017    Order Specific Question:  If indicated for the ordered procedure, I authorize the  administration of contrast media per Radiology protocol    Answer:  Yes    Order Specific Question:  Reason for Exam (SYMPTOM  OR DIAGNOSIS REQUIRED)    Answer:  left temporal pain    Order Specific Question:  Preferred imaging location?    Answer:  [facility]   Tylenol OTC if worsens Will call with test results  Mary-Margaret Hassell Done, FNP

## 2015-10-10 ENCOUNTER — Ambulatory Visit
Admission: RE | Admit: 2015-10-10 | Discharge: 2015-10-10 | Disposition: A | Discharge status: Home / Self Care | Payer: PPO | Source: Ambulatory Visit | Attending: Nurse Practitioner | Admitting: Nurse Practitioner

## 2015-10-10 ENCOUNTER — Ambulatory Visit (INDEPENDENT_AMBULATORY_CARE_PROVIDER_SITE_OTHER): Payer: PPO | Admitting: Family Medicine

## 2015-10-10 ENCOUNTER — Other Ambulatory Visit: Payer: Self-pay

## 2015-10-10 ENCOUNTER — Encounter: Payer: Self-pay | Admitting: Family Medicine

## 2015-10-10 VITALS — BP 113/66 | HR 67 | Temp 97.1°F | Ht 71.0 in | Wt 202.0 lb

## 2015-10-10 DIAGNOSIS — H539 Unspecified visual disturbance: Secondary | ICD-10-CM

## 2015-10-10 DIAGNOSIS — M316 Other giant cell arteritis: Secondary | ICD-10-CM | POA: Diagnosis not present

## 2015-10-10 DIAGNOSIS — R51 Headache: Secondary | ICD-10-CM | POA: Diagnosis not present

## 2015-10-10 DIAGNOSIS — R519 Headache, unspecified: Secondary | ICD-10-CM

## 2015-10-10 DIAGNOSIS — R42 Dizziness and giddiness: Secondary | ICD-10-CM | POA: Diagnosis not present

## 2015-10-10 MED ORDER — IOPAMIDOL (ISOVUE-300) INJECTION 61%
75.0000 mL | Freq: Once | INTRAVENOUS | Status: AC | PRN
  Administered 2015-10-10: 75 mL via INTRAVENOUS

## 2015-10-10 MED ORDER — METHYLPREDNISOLONE 8 MG PO TABS: ORAL_TABLET | ORAL | Status: DC

## 2015-10-10 NOTE — Progress Notes (Signed)
Subjective:  Patient ID: Ronald Russell, male    DOB: July 30, 1934  Age: 81 y.o. MRN: [id number]  CC: Headache   HPI Ronald Russell presents for Left temporal headache. Headache is moderate. He says that his vision is been affected. He feels like there is a film over the eye. He was diagnosed some time ago and has seen for doctors and no one has been willing to treat him. He was diagnosed by a surgeon who did a biopsy and confirmed the diagnosis he was then sent to his primary care doctor who treated him for some cardiac issues and never treated him for the temporal arteritis. Today the headache has been intense enough that he is unable to function. He had his CT of the head today. The headaches have been recurrent since onset apparently 4 years ago.   History Ronald Russell has a past medical history of Hypertension; GERD (gastroesophageal reflux disease); Seasonal allergies; HOH (hard of hearing); PONV (postoperative nausea and vomiting); Temporal arteritis ([place]); Pneumonia; Chronic bronchitis ([place]); Exertional shortness of breath; History of blood transfusion (1949); Ocular migraine; Bladder cancer ([place]) (2010); Anxiety; Allergy; Cataract; and Clotting disorder ([place]).   He has past surgical history that includes Transurethral resection of bladder tumor (2010 X 3); mastoid tumor removed (Right, 1964); Tonsillectomy (1940's); Skin graft (Right, 1949); Colonoscopy; Vasectomy; Artery Biopsy (Left, 01/09/2013); and Cardiovascular stress test (07/2011).   His family history includes Alzheimer's disease in his father; Cancer in his mother. There is no history of Anemia, Arrhythmia, Asthma, Clotting disorder, Fainting, Heart attack, Heart disease, Heart failure, Hyperlipidemia, or Hypertension.He reports that he has quit smoking. His smoking use included Cigarettes. He has a 2.25 pack-year smoking history. He has never used smokeless tobacco. He reports that he does not drink alcohol or use illicit  drugs.    ROS Review of Systems  Constitutional: Negative for fever, chills and diaphoresis.  HENT: Negative for rhinorrhea and sore throat.   Respiratory: Negative for cough and shortness of breath.   Cardiovascular: Negative for chest pain.  Gastrointestinal: Negative for abdominal pain.  Musculoskeletal: Negative for myalgias and arthralgias.  Skin: Negative for rash.  Neurological: Positive for headaches. Negative for seizures, speech difficulty, weakness and numbness.    Objective:  BP 113/66 mmHg  Pulse 67  Temp(Src) 97.1 F (36.2 C) (Oral)  Ht '5\' 11"'$  (1.803 m)  Wt 202 lb (91.627 kg)  BMI 28.19 kg/m2  SpO2 94%  BP Readings from Last 3 Encounters:  10/10/15 113/66  10/09/15 124/69  09/30/15 152/77    Wt Readings from Last 3 Encounters:  10/10/15 202 lb (91.627 kg)  10/09/15 200 lb 9.6 oz (90.992 kg)  09/30/15 200 lb 9.6 oz (90.992 kg)     Physical Exam  Constitutional: He is oriented to person, place, and time. He appears well-developed and well-nourished.  HENT:  Head: Normocephalic and atraumatic.  Right Ear: Tympanic membrane and external ear normal. No decreased hearing is noted.  Left Ear: Tympanic membrane and external ear normal. No decreased hearing is noted.  Mouth/Throat: No oropharyngeal exudate or posterior oropharyngeal erythema.  Eyes: Pupils are equal, round, and reactive to light.  Neck: Normal range of motion. Neck supple.  Cardiovascular: Normal rate and regular rhythm.   No murmur heard. Pulmonary/Chest: Breath sounds normal. No respiratory distress.  Abdominal: Soft. Bowel sounds are normal. He exhibits no mass. There is no tenderness.  Neurological: He is alert and oriented to person, place, and time. No cranial nerve deficit. Coordination  normal.  Skin: Skin is warm and dry.  Psychiatric: He has a normal mood and affect.  Vitals reviewed.    Lab Results  Component Value Date   WBC 8.2 07/09/2015   HGB 13.8 02/14/2013   HCT 46.2  07/09/2015   PLT 134* 07/09/2015   GLUCOSE 94 07/09/2015   CHOL 178 06/26/2015   TRIG 197* 06/26/2015   HDL 35* 06/26/2015   LDLCALC 104* 06/26/2015   ALT 20 07/09/2015   AST 24 07/09/2015   NA 140 07/09/2015   K 3.5 07/09/2015   CL 100 07/09/2015   CREATININE 1.30* 07/09/2015   BUN 15 07/09/2015   CO2 18 07/09/2015   TSH 4.093 02/13/2013   INR 0.91 07/31/2011   HGBA1C 5.2 02/13/2013    Ct Head W Wo Contrast  10/10/2015  CLINICAL DATA:  Left temporal pain for multiple years, acutely worse in the past several days. Headaches, vertigo PE, and blurry vision. Remote right mastoid surgery. Creatinine was obtained on site at [facility] at [address] Results: Creatinine 1.3 mg/dL. EXAM: CT HEAD WITHOUT AND WITH CONTRAST TECHNIQUE: Contiguous axial images were obtained from the base of the skull through the vertex without and with intravenous contrast CONTRAST:  35m ISOVUE-300 IOPAMIDOL (ISOVUE-300) INJECTION 61% COMPARISON:  Maxillofacial CT 05/01/2012.  Head CT 08/31/2011. FINDINGS: There is no evidence of acute cortical infarct, intracranial hemorrhage, mass, midline shift, or extra-axial fluid collection. Ventricles and sulci are normal for age. Periventricular white-matter hypodensities are similar to the prior study and nonspecific but compatible with minimal chronic small vessel ischemic disease. No abnormal enhancement is identified. Major dural venous sinuses appear patent. Orbits are unremarkable. Sequelae of prior right canal wall up mastoidectomy are again identified. The left mastoid air cells are clear. The visualized paranasal sinuses are clear. IMPRESSION: 1. No acute intracranial abnormality or mass. 2. Minimal chronic small vessel ischemic disease. Electronically Signed   By: ALogan BoresM.D.   On: 10/10/2015 13:43    Assessment & Plan:   RGeniewas seen today for headache.  Diagnoses and all orders for this visit:  Temporal arteritis ([place]  Other  orders -     methylPREDNISolone (MEDROL) 8 MG tablet; 4 po daily for 1 week. Then 3 p.o daily for 1 week. Then 2 q day X 1 wk. Then 1 daily for 1 week  CT head reviewed  I am having Mr. SLoveallstart on methylPREDNISolone. I am also having him maintain his aspirin EC, vitamin E, LORazepam, atenolol, omeprazole, fluocinonide-emollient, triamcinolone cream, and doxycycline.  Meds ordered this encounter  Medications  . methylPREDNISolone (MEDROL) 8 MG tablet    Sig: 4 po daily for 1 week. Then 3 p.o daily for 1 week. Then 2 q day X 1 wk. Then 1 daily for 1 week    Dispense:  70 tablet    Refill:  0     Follow-up: Return in about 1 month (around 11/09/2015).  WClaretta Fraise M.D.

## 2015-10-15 ENCOUNTER — Telehealth: Payer: Self-pay | Admitting: Family Medicine

## 2015-10-15 NOTE — Telephone Encounter (Signed)
Please contact the patient to let him know that that BP will not cause any serious issue other than perhaps a headache. He can use tylenol for that. Rest F/U in office tomorrow.

## 2015-10-16 NOTE — Telephone Encounter (Signed)
Spoke to pt and advised of md feedback and pt voiced understanding.

## 2015-10-28 DIAGNOSIS — K22719 Barrett's esophagus with dysplasia, unspecified: Secondary | ICD-10-CM | POA: Diagnosis not present

## 2015-10-28 DIAGNOSIS — M316 Other giant cell arteritis: Secondary | ICD-10-CM | POA: Diagnosis not present

## 2015-10-28 DIAGNOSIS — Z86010 Personal history of colonic polyps: Secondary | ICD-10-CM | POA: Diagnosis not present

## 2015-11-04 ENCOUNTER — Encounter: Payer: Self-pay | Admitting: Family Medicine

## 2015-11-04 ENCOUNTER — Ambulatory Visit (INDEPENDENT_AMBULATORY_CARE_PROVIDER_SITE_OTHER): Payer: PPO | Admitting: Family Medicine

## 2015-11-04 VITALS — BP 122/73 | HR 73 | Temp 97.0°F | Ht 71.0 in | Wt 194.2 lb

## 2015-11-04 DIAGNOSIS — R252 Cramp and spasm: Secondary | ICD-10-CM

## 2015-11-04 DIAGNOSIS — L720 Epidermal cyst: Secondary | ICD-10-CM | POA: Diagnosis not present

## 2015-11-04 DIAGNOSIS — R799 Abnormal finding of blood chemistry, unspecified: Secondary | ICD-10-CM | POA: Diagnosis not present

## 2015-11-04 DIAGNOSIS — M316 Other giant cell arteritis: Secondary | ICD-10-CM | POA: Diagnosis not present

## 2015-11-04 MED ORDER — GABAPENTIN 300 MG PO CAPS: 300.0000 mg | ORAL_CAPSULE | Freq: Every day | ORAL | Status: DC

## 2015-11-04 NOTE — Progress Notes (Signed)
   HPI  Patient presents today here with leg cramps and cyst on the back.  Leg cramps He complains of leg cramps over the last month or 2 that it's gotten much more severe, he's had cramps for several years, approximately 2012, in the hands, legs, feet. Over the last few months it's gotten much more severe in the bilateral legs and he has most issues while sleeping either taking naps or laying down at night.  He denies any other symptoms associated with this.  Cyst States that there is a cyst in his midline back that then popped by his daughter several times. At times he gets sore, however he has never been treated with antibiotics for it. At this time is not bothering him  Concern temporal arteritis Patient with long history of left-sided temporal pain Cloudy history with also ocular migraines and anxiety. He has had a biopsy by Dr. Lucia Gaskins previously which was negative, however Dr. Lucia Gaskins, a very well-known ENT in the area, was very confident it was actually temporal arteritis  The patient has now been off and on steroids for several months, he was started on a Medrol taper last month. His pain is improved,  PMH: Smoking status noted ROS: Per HPI  Objective: BP 122/73 mmHg  Pulse 73  Temp(Src) 97 F (36.1 C) (Oral)  Ht _0  (1.803 m)  Wt 194 lb 3.2 oz (88.089 kg)  BMI 27.10 kg/m2 Gen: NAD, alert, cooperative with exam HEENT: NCAT CV: RRR, good S1/S2, no murmur Resp: CTABL, no wheezes, non-labored Ext: No edema, warm Neuro: Alert and oriented, No gross deficits  Skin: Midline thoracic back with small nontender sebaceous cyst, no erythema or signs of current infection.    Assessment and plan:  # Leg cramps B complex vitamins, gabapentin for nighttime symptoms Return to clinic as needed Labs  # Sebaceous cyst/epidermal inclusion cyst Refer to dermatology as this will likely be difficult to excise given that it's been lanced several times by family member No  Signs of active infection  # History of temporal arteritis, current concerns Symptoms improved on steroids Discussed with him that the sedimentation rate will likely be low considering that he's been on steroids Given he has tenderness and characteristic pain I think is very poor and that we consider a biopsy, referring to ENT for consideration of biopsy    Orders Placed This Encounter  Procedures  . CMP14+EGFR  . Ambulatory referral to Dermatology    Referral Priority:  Routine    Referral Type:  Consultation    Referral Reason:  Specialty Services Required    Requested Specialty:  Dermatology    Number of Visits Requested:  1    Meds ordered this encounter  Medications  . gabapentin (NEURONTIN) 300 MG capsule    Sig: Take 1 capsule (300 mg total) by mouth at bedtime.    Dispense:  30 capsule    Refill:  [place], MD [place] 11/04/2015, 2:59 PM

## 2015-11-04 NOTE — Patient Instructions (Signed)
Great to see you!  For leg cramps: Try B complex vitamins 3 times a day Start gabapentin 1 pill 1 hour before bed.   We will call with lab results within 1 week  You will hear from [place] for a dermatology referral

## 2015-11-05 LAB — CMP14+EGFR
ALT: 24 IU/L (ref 0–44)
AST: 17 IU/L (ref 0–40)
Albumin/Globulin Ratio: 1.9 (ref 1.2–2.2)
Albumin: 3.8 g/dL (ref 3.5–4.8)
Alkaline Phosphatase: 50 IU/L (ref 39–117)
BUN/Creatinine Ratio: 17 (ref 10–24)
BUN: 22 mg/dL (ref 8–27)
Bilirubin Total: 0.5 mg/dL (ref 0.0–1.2)
CO2: 22 mmol/L (ref 18–29)
Calcium: 8.1 mg/dL — ABNORMAL LOW (ref 8.6–10.2)
Chloride: 101 mmol/L (ref 96–106)
Creatinine, Ser: 1.3 mg/dL — ABNORMAL HIGH (ref 0.76–1.27)
GFR calc Af Amer: 60 mL/min/{1.73_m2} (ref >59)
GFR calc non Af Amer: 52 mL/min/{1.73_m2} — ABNORMAL LOW (ref >59)
Globulin, Total: 2 g/dL (ref 1.5–4.5)
Glucose: 88 mg/dL (ref 65–99)
Potassium: 3.6 mmol/L (ref 3.5–5.2)
Sodium: 140 mmol/L (ref 134–144)
Total Protein: 5.8 g/dL — ABNORMAL LOW (ref 6.0–8.5)

## 2015-11-05 LAB — SEDIMENTATION RATE: Sed Rate: 2 mm/hr (ref 0–30)

## 2015-11-06 ENCOUNTER — Other Ambulatory Visit: Payer: Self-pay | Admitting: Family Medicine

## 2015-11-06 DIAGNOSIS — H6121 Impacted cerumen, right ear: Secondary | ICD-10-CM | POA: Diagnosis not present

## 2015-11-06 DIAGNOSIS — E559 Vitamin D deficiency, unspecified: Secondary | ICD-10-CM | POA: Insufficient documentation

## 2015-11-06 DIAGNOSIS — G44201 Tension-type headache, unspecified, intractable: Secondary | ICD-10-CM | POA: Diagnosis not present

## 2015-11-06 DIAGNOSIS — H7201 Central perforation of tympanic membrane, right ear: Secondary | ICD-10-CM | POA: Diagnosis not present

## 2015-11-06 LAB — VITAMIN D 25 HYDROXY (VIT D DEFICIENCY, FRACTURES): Vit D, 25-Hydroxy: 7.7 ng/mL — ABNORMAL LOW (ref 30.0–100.0)

## 2015-11-06 LAB — SPECIMEN STATUS REPORT

## 2015-11-06 MED ORDER — VITAMIN D (ERGOCALCIFEROL) 1.25 MG (50000 UNIT) PO CAPS: 50000.0000 [IU] | ORAL_CAPSULE | ORAL | Status: DC

## 2015-11-11 ENCOUNTER — Encounter: Payer: Self-pay | Admitting: Family Medicine

## 2015-11-11 ENCOUNTER — Ambulatory Visit (INDEPENDENT_AMBULATORY_CARE_PROVIDER_SITE_OTHER): Payer: PPO | Admitting: Family Medicine

## 2015-11-11 ENCOUNTER — Telehealth: Payer: Self-pay | Admitting: Family Medicine

## 2015-11-11 VITALS — BP 105/65 | HR 70 | Temp 97.1°F | Ht 71.0 in | Wt 196.8 lb

## 2015-11-11 DIAGNOSIS — E559 Vitamin D deficiency, unspecified: Secondary | ICD-10-CM | POA: Diagnosis not present

## 2015-11-11 DIAGNOSIS — M316 Other giant cell arteritis: Secondary | ICD-10-CM | POA: Diagnosis not present

## 2015-11-11 MED ORDER — METHYLPREDNISOLONE 8 MG PO TABS: ORAL_TABLET | ORAL | Status: DC

## 2015-11-11 MED ORDER — PREDNISONE 20 MG PO TABS: ORAL_TABLET | ORAL | Status: DC

## 2015-11-11 NOTE — Telephone Encounter (Signed)
appt made

## 2015-11-11 NOTE — Patient Instructions (Addendum)
Great to see you!  I am starting you on prednisone and sending you to the headache clinic  For now I think we are wise to treat you for temporal arteritis given that missing it can have such grave consequences.   Your sed rate could be low due to the prednisone   Temporal Arteritis Temporal arteritis, also called giant cell arteritis, is a condition that causes arteries to become swollen (inflamed). It usually affects arteries in your head and face, but arteries in any part of the body can become inflamed. Temporal arteritis can cause serious problems, such as bone loss, diabetes, and blindness. CAUSES  The cause is unknown. RISK FACTORS  Being older than 26.  Being a woman.  Being Caucasian.  Being of [place], [place], [place], [place] or [place] ancestry.  Having polymyalgia rheumatica (PMR). SIGNS AND SYMPTOMS  Some people with temporal arteritis have just one symptom, while other have several symptoms. Most signs and symptoms are related to the head and face. Signs and symptoms may include:  Hard or swollen temples (common). Your temples are the flattened area on either side of your forehead. If your temples are swollen, it may hurt to touch them.  Pain when combing your hair or when laying your head down.  Pain in the jaw when chewing.  Pain in the throat or tongue.  Problems with your vision, such as sudden loss of vision in one eye, or seeing double.  Fever.  Fatigue.  A dry cough.  Pain in the hips and shoulders.  Pain in the arms during exercise.  Depression.  Weight loss. DIAGNOSIS  Your health care provider will ask about your symptoms and do a physical exam. He or she may also perform an eye exam and tests, such as:  A complete blood count.  An erythrocyte sedimentation rate test, also called the sed rate test.  A C-reactive protein (CRP) test.  A tissue sample (biopsy) test. TREATMENT  Temporal arteritis is treated with a type of medicine  called a corticosteroid. Vision problems may be treated with additional medicines. You will need to see your health care provider while you are being treated. During follow-up visits, your health care provider will check for problems by:  Performing blood tests and bone density tests.  Checking your blood pressure and blood sugar. HOME CARE INSTRUCTIONS   Take medicines only as directed by your health care provider.  Take any vitamins or supplements that your health care provider suggests. These may include vitamin D and calcium, which help keep your bones from becoming weak.  Exercise. Talk with your health care provider about what exercises are okay for you to do. Usually exercises that increase your heart rate (aerobic exercise), such as walking, are recommended. Aerobic exercise helps control your blood pressure and prevent bone loss.  Follow a healthy diet. Include healthy sources of protein, fruits, vegetables, and whole grains in your diet. Following a healthy diet helps prevent bone damage and diabetes. SEEK MEDICAL CARE IF:   Your symptoms get worse.  Your fever, fatigue, headache, weight loss, or pain in your jaw gets worse.  You develop signs of infection, such as fever, swelling, redness, warmth, and tenderness. SEEK IMMEDIATE MEDICAL CARE IF:   Your vision gets worse.  Your pain does not go away, even after you take pain medicine.  You have chest pain.  You have trouble breathing.  One side of your face or body suddenly becomes weak or numb. MAKE SURE YOU:  Understand  these instructions.  Will watch your condition.  Will get help right away if you are not doing well or get worse.   This information is not intended to replace advice given to you by your health care provider. Make sure you discuss any questions you have with your health care provider.   Document Released: 03/28/2009 Document Revised: 06/21/2014 Document Reviewed: 07/25/2013 Elsevier Interactive  Patient Education [employer].

## 2015-11-11 NOTE — Progress Notes (Signed)
   HPI  Patient presents today here for follow-up of temporal arteritis and muscle twitches.  Muscle twitches He was found to be hypocalcemia and have a vitamin D of 7 He was on 50,000 units of vitamin D weekly and calcium daily, his symptoms have completely resolved.  Temporal arteritis Has been seen by ear nose and throat, who explains that he could have temporal arteritis given his symptoms, however his previous biopsy was negative His symptoms were improved on prednisone, they've returned slightly after finishing the taper. He would like a referral to the headache clinic. His vision is okay today, he states that it's coming and going. Describes dull achy left-sided temporal area head pain helped by prednisone and Tylenol  PMH: Smoking status noted ROS: Per HPI  Objective: BP 105/65 mmHg  Pulse 70  Temp(Src) 97.1 F (36.2 C) (Oral)  Ht '5\' 11"'$  (1.803 m)  Wt 196 lb 12.8 oz (89.268 kg)  BMI 27.46 kg/m2 Gen: NAD, alert, cooperative with exam HEENT: NCAT, mild tenderness to palpation of left temporal area CV: RRR, good S1/S2, no murmur Resp: CTABL, no wheezes, non-labored Ext: No edema, warm Neuro: Alert and oriented  Assessment and plan:  # Temporal arteritis Clinical diagnosis Sedimentation rate was low, we consider that this is likely due to current steroid use Previous biopsy was negative, however clinically he was positive and felt to have genuine temporal arteritis by his ear nose and throat surgeon Starting medrol, plan to continue 4 mg daily for a few months Sitting to headache clinic for their opinion Return to clinic in one month  # Muscle twitches, hypocalcemia, vitamin D deficiency Improved on vitamin D and calcium replacement Continued, recheck in 3 months    Meds ordered this encounter  Medications  . predniSONE (DELTASONE) 20 MG tablet    Sig: Take 2 pills a day for 7 days, 1-1/2 pills a day for 7 days, 1 pill a for 7 days, then one half pill per day  for 7 days    Dispense:  35 tablet    Refill:  0  . methylPREDNISolone (MEDROL) 8 MG tablet    Sig: 4 po daily for 1 week. Then 3 p.o daily for 1 week. Then 2 q day X 1 wk. Then 1 daily for 1 week    Dispense:  70 tablet    Refill:  0    Please disregard prednisone    Laroy Apple, MD [facility] 11/11/2015, 3:08 PM

## 2015-11-13 DIAGNOSIS — G44219 Episodic tension-type headache, not intractable: Secondary | ICD-10-CM | POA: Diagnosis not present

## 2015-11-19 DIAGNOSIS — H40033 Anatomical narrow angle, bilateral: Secondary | ICD-10-CM | POA: Diagnosis not present

## 2015-11-19 DIAGNOSIS — H2513 Age-related nuclear cataract, bilateral: Secondary | ICD-10-CM | POA: Diagnosis not present

## 2015-11-25 ENCOUNTER — Other Ambulatory Visit: Payer: Self-pay | Admitting: Family Medicine

## 2015-11-25 NOTE — Telephone Encounter (Signed)
Refilled Ativan, needs office visit before next refill.  Laroy Apple, MD [facility] 11/25/2015, 3:49 PM

## 2015-11-27 ENCOUNTER — Encounter: Payer: Self-pay | Admitting: Neurology

## 2015-11-27 ENCOUNTER — Ambulatory Visit (INDEPENDENT_AMBULATORY_CARE_PROVIDER_SITE_OTHER): Payer: PPO | Admitting: Family Medicine

## 2015-11-27 ENCOUNTER — Ambulatory Visit (INDEPENDENT_AMBULATORY_CARE_PROVIDER_SITE_OTHER): Payer: PPO | Admitting: Neurology

## 2015-11-27 VITALS — BP 147/75 | HR 64 | Ht 71.0 in | Wt 195.2 lb

## 2015-11-27 VITALS — BP 121/80 | HR 65 | Temp 96.8°F | Ht 71.0 in | Wt 194.2 lb

## 2015-11-27 DIAGNOSIS — H539 Unspecified visual disturbance: Secondary | ICD-10-CM | POA: Diagnosis not present

## 2015-11-27 DIAGNOSIS — M316 Other giant cell arteritis: Secondary | ICD-10-CM

## 2015-11-27 DIAGNOSIS — R21 Rash and other nonspecific skin eruption: Secondary | ICD-10-CM | POA: Diagnosis not present

## 2015-11-27 DIAGNOSIS — M542 Cervicalgia: Secondary | ICD-10-CM

## 2015-11-27 DIAGNOSIS — R51 Headache: Secondary | ICD-10-CM

## 2015-11-27 DIAGNOSIS — G500 Trigeminal neuralgia: Secondary | ICD-10-CM | POA: Diagnosis not present

## 2015-11-27 DIAGNOSIS — R519 Headache, unspecified: Secondary | ICD-10-CM

## 2015-11-27 MED ORDER — METHYLPREDNISOLONE ACETATE 80 MG/ML IJ SUSP
80.0000 mg | Freq: Once | INTRAMUSCULAR | Status: AC
  Administered 2015-11-27: 80 mg via INTRAMUSCULAR

## 2015-11-27 MED ORDER — METHYLPREDNISOLONE 2 MG PO TABS
4.0000 mg | ORAL_TABLET | Freq: Every day | ORAL | Status: DC
Start: 2015-11-27 — End: 2015-12-25

## 2015-11-27 MED ORDER — METHYLPREDNISOLONE 4 MG PO TABS: 4.0000 mg | ORAL_TABLET | Freq: Every day | ORAL | Status: DC

## 2015-11-27 MED ORDER — PREDNISONE 1 MG PO TABS: 4.0000 mg | ORAL_TABLET | Freq: Every day | ORAL | Status: DC

## 2015-11-27 NOTE — Patient Instructions (Signed)
Great to see you!  I am treating your pain with steroid injection.   We will plan to see you again in 3 weeks  Please come back for biopsy of the rash if it is not imporving by early next week  Stop the once weekly vitamin D, continue the daily.

## 2015-11-27 NOTE — Progress Notes (Signed)
Called pt pharmacy and cx rx prednisone d/t pt allergy. Spoke to pharmacist. They cx rx.

## 2015-11-27 NOTE — Progress Notes (Signed)
   HPI  Patient presents today with neck pain and rash.  Patient explains that he's got neck pain, upper back pain, and right shoulder pain for the last 3 days.  He states that previously had a similar pain and had a lot of help from a steroid injection to the shoulder.  He also displays that he has numbness across a small strip of his right forearm and some tingling in his right fourth and fifth digits. He has no weakness or loss of sensation in that hand. He has no headaches, injury, fever, chills, sweats, or shortness of breath.  He is currently on a Medrol taper for temporal arteritis  Rash Started about 30 minutes to an hour ago, only on the right upper arm. Denies any new laundry detergents, lotions, or soaps. States that it is not itchy   PMH: Smoking status noted ROS: Per HPI  Objective: BP 121/80 mmHg  Pulse 65  Temp(Src) 96.8 F (36 C) (Oral)  Ht '5\' 11"'$  (1.803 m)  Wt 194 lb 3.2 oz (88.089 kg)  BMI 27.10 kg/m2 Gen: NAD, alert, cooperative with exam HEENT: NCAT CV: RRR, good S1/S2, no murmur Resp: CTABL, no wheezes, non-labored Ext: No edema, warm Neuro: Alert and oriented, strength 4/5 and symmetric in bilateral upper extremities, normal sensation  Musculoskeletal Tenderness to palpation of right-sided cervical paraspinal muscles, right trapezius area, right deltoid muscle. He does have some mild pain with empty can and Hawkins tests, however there is no grimace and no loss of strength during this, normal range of motion of both shoulders  Rash 10- 20 irregularly shaped pink lesions on the Upper arm, no areas of fluctuance or drainage, blanchable  Assessment and plan:  # Neck pain New onset All of his current pain symptoms, neck, shoulder, arm, hand, and upper back, all extending from the neck, radicular dist. No weakness to warrant rapid MRI Consider MRI should symptoms worsen or persist Declined shoulder injection, I do not think he has rotator cuff  syndrome currently IM depo medrol today, continue medrol PO Continue PO medrol 4 mg daily for 1-2 months after taper, hopoefully will maintain his temporal arteritis symptoms Avoiding NSAIDs with renal dysfunction Continue tylenol, 3G limit   # rash Appearance of urticaria, considering temporal arteritis I am suspicious of vasculitis Return to clinic if not improving within 4-5 days, also return sooner if symptoms are worsening. Low threshold for biopsy RTC with any concerns   Meds ordered this encounter  Medications  . calcium-vitamin D (OSCAL WITH D) 500-200 MG-UNIT tablet    Sig: Take 1 tablet by mouth.  . methylPREDNISolone (MEDROL) 8 MG tablet    Sig: Take 8 mg by mouth daily. 4 po daily for 1 week. Then 3 po daily for 1 week. Then 2 po daily for 1 week. Then 1 daily for 1 week.  . methylPREDNISolone (MEDROL) 4 MG tablet    Sig: Take 1 tablet (4 mg total) by mouth daily.    Dispense:  30 tablet    Refill:  [place], MD [facility] 11/27/2015, 11:57 AM

## 2015-11-27 NOTE — Patient Instructions (Addendum)
Overall you are doing fairly well but I do want to suggest a few things today:   Remember to drink plenty of fluid, eat healthy meals and do not skip any meals. Try to eat protein with a every meal and eat a healthy snack such as fruit or nuts in between meals. Try to keep a regular sleep-wake schedule and try to exercise daily, particularly in the form of walking, 20-30 minutes a day, if you can.   As far as your medications are concerned, I would like to suggest: stay on '4mg'$  for two months, then 2 mg for one month and come back and see me.  As far as diagnostic testing: MRI of the brain w/wo contrast  I would like to see you back in 3 months, sooner if we need to. Please call us with any interim questions, concerns, problems, updates or refill requests.   Our phone number is [phone]. We also have an after hours call service for urgent matters and there is a physician on-call for urgent questions. For any emergencies you know to call 911 or go to the nearest emergency room
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
## 2015-11-28 ENCOUNTER — Ambulatory Visit (INDEPENDENT_AMBULATORY_CARE_PROVIDER_SITE_OTHER): Payer: PPO | Admitting: Family Medicine

## 2015-11-28 ENCOUNTER — Encounter: Payer: Self-pay | Admitting: Family Medicine

## 2015-11-28 VITALS — BP 135/84 | HR 74 | Temp 97.5°F | Ht 71.0 in | Wt 191.6 lb

## 2015-11-28 DIAGNOSIS — S46911A Strain of unspecified muscle, fascia and tendon at shoulder and upper arm level, right arm, initial encounter: Secondary | ICD-10-CM

## 2015-11-28 MED ORDER — METHYLPREDNISOLONE ACETATE 80 MG/ML IJ SUSP
80.0000 mg | Freq: Once | INTRAMUSCULAR | Status: AC
  Administered 2015-11-28: 80 mg via INTRA_ARTICULAR

## 2015-11-28 NOTE — Progress Notes (Signed)
BP 135/84 mmHg  Pulse 74  Temp(Src) 97.5 F (36.4 C) (Oral)  Ht '5\' 11"'$  (1.803 m)  Wt 191 lb 9.6 oz (86.909 kg)  BMI 26.73 kg/m2   Subjective:    Patient ID: Ronald Russell, male    DOB: [birth year], 81 y.o.   MRN: [id number]  HPI: Ronald Russell is a 81 y.o. male presenting on 11/28/2015 for Right shoulder and arm pain and Rash on right arm has worsened   HPI  Shoulder pain Has chronic shoulder pain that is acutely worsened over the past couple weeks. The pain is in his right shoulder and hurts more on the lateral aspect of the shoulder. He also reports some weakness with some range of motion especially over the head. He denies any swelling or fevers or chills or redness or warmth. He denies any tingling or numbness shooting down his arm  Relevant past medical, surgical, family and social history reviewed and updated as indicated. Interim medical history since our last visit reviewed. Allergies and medications reviewed and updated.  Review of Systems  Constitutional: Negative for fever.  HENT: Negative for ear discharge and ear pain.   Eyes: Negative for discharge and visual disturbance.  Respiratory: Negative for shortness of breath and wheezing.   Cardiovascular: Negative for chest pain and leg swelling.  Gastrointestinal: Negative for abdominal pain, diarrhea and constipation.  Genitourinary: Negative for difficulty urinating.  Musculoskeletal: Positive for arthralgias. Negative for back pain, joint swelling and gait problem.  Skin: Positive for rash.  Neurological: Negative for syncope, light-headedness and headaches.  All other systems reviewed and are negative.   Per HPI unless specifically indicated above     Medication List       This list is accurate as of: 11/28/15 12:22 PM.  Always use your most recent med list.               acetaminophen 500 MG tablet  Commonly known as:  TYLENOL  Take 500 mg by mouth as needed.     aspirin EC 81 MG tablet  Take 81  mg by mouth daily. Reported on 11/28/2015     atenolol 25 MG tablet  Commonly known as:  TENORMIN  Take 1 tablet (25 mg total) by mouth daily.     calcium-vitamin D 500-200 MG-UNIT tablet  Commonly known as:  OSCAL WITH D  Take 1 tablet by mouth.     fluocinonide-emollient 0.05 % cream  Commonly known as:  LIDEX-E  Apply topically 2 (two) times daily.     LORazepam 0.5 MG tablet  Commonly known as:  ATIVAN  TAKE 1 TABLET BY MOUTH AT BEDTIME     methylPREDNISolone 2 MG tablet  Commonly known as:  MEDROL  Take 2 tablets (4 mg total) by mouth daily. Taper as discussed     omeprazole 40 MG capsule  Commonly known as:  PRILOSEC  Take 1 capsule (40 mg total) by mouth 2 (two) times daily.     triamcinolone cream 0.1 %  Commonly known as:  KENALOG  Apply 1 application topically 2 (two) times daily.           Objective:    BP 135/84 mmHg  Pulse 74  Temp(Src) 97.5 F (36.4 C) (Oral)  Ht '5\' 11"'$  (1.803 m)  Wt 191 lb 9.6 oz (86.909 kg)  BMI 26.73 kg/m2  Wt Readings from Last 3 Encounters:  11/28/15 191 lb 9.6 oz (86.909 kg)  11/27/15 195 lb 3.2 oz (88.542  kg)  11/27/15 194 lb 3.2 oz (88.089 kg)    Physical Exam  Constitutional: He is oriented to person, place, and time. He appears well-developed and well-nourished. No distress.  Eyes: Conjunctivae and EOM are normal. Pupils are equal, round, and reactive to light. Right eye exhibits no discharge. No scleral icterus.  Neck: Neck supple. No thyromegaly present.  Cardiovascular: Normal rate, regular rhythm, normal heart sounds and intact distal pulses.   No murmur heard. Pulmonary/Chest: Effort normal and breath sounds normal. No respiratory distress. He has no wheezes.  Musculoskeletal: Normal range of motion. He exhibits tenderness. He exhibits no edema.       Right shoulder: He exhibits tenderness (Tenderness on the lateral aspect over the deltoid) and decreased strength (Decreased strength with external rotation and  abduction above 90 degrees). He exhibits normal range of motion, no bony tenderness, no swelling, no effusion, no crepitus, no deformity and normal pulse.  Lymphadenopathy:    He has no cervical adenopathy.  Neurological: He is alert and oriented to person, place, and time. Coordination normal.  Skin: Skin is warm and dry. No rash noted. He is not diaphoretic.  Psychiatric: He has a normal mood and affect. His behavior is normal.  Nursing note and vitals reviewed.   Subacromial/shoulder injection: Risk factors of bleeding and infection discussed with patient and patient is agreeable towards injection. Patient prepped with Betadine. Lateral approach towards injection used. Injected '80mg'$  of Depo-Medrol and 1 mL of 2% lidocaine. Patient tolerated procedure well and no side effects from noted. Minimal to no bleeding. Simple bandage applied after.     Assessment & Plan:       Problem List Items Addressed This Visit    None    Visit Diagnoses    Right shoulder strain, initial encounter    -  Primary    Give steroid injection to the patient to move around. Possible concern this could be coming from his spine    Relevant Medications    methylPREDNISolone acetate (DEPO-MEDROL) injection 80 mg (Start on 11/28/2015 12:30 PM)        Follow up plan: Return if symptoms worsen or fail to improve.  Counseling provided for all of the vaccine components No orders of the defined types were placed in this encounter.    Caryl Pina, MD [facility] 11/28/2015, 12:22 PM

## 2015-11-29 ENCOUNTER — Telehealth: Payer: Self-pay | Admitting: Family Medicine

## 2015-11-29 NOTE — Telephone Encounter (Signed)
Side is not much else we can do at this point. If it continues to cause issues then I would go to see an orthopedic

## 2015-11-29 NOTE — Telephone Encounter (Signed)
Pt aware.

## 2015-12-01 ENCOUNTER — Encounter: Payer: Self-pay | Admitting: Neurology

## 2015-12-01 DIAGNOSIS — M5030 Other cervical disc degeneration, unspecified cervical region: Secondary | ICD-10-CM | POA: Diagnosis not present

## 2015-12-01 DIAGNOSIS — R51 Headache: Secondary | ICD-10-CM

## 2015-12-01 DIAGNOSIS — M9901 Segmental and somatic dysfunction of cervical region: Secondary | ICD-10-CM | POA: Diagnosis not present

## 2015-12-01 DIAGNOSIS — R519 Headache, unspecified: Secondary | ICD-10-CM | POA: Insufficient documentation

## 2015-12-02 DIAGNOSIS — M5030 Other cervical disc degeneration, unspecified cervical region: Secondary | ICD-10-CM | POA: Diagnosis not present

## 2015-12-02 DIAGNOSIS — M9901 Segmental and somatic dysfunction of cervical region: Secondary | ICD-10-CM | POA: Diagnosis not present

## 2015-12-03 DIAGNOSIS — D239 Other benign neoplasm of skin, unspecified: Secondary | ICD-10-CM | POA: Diagnosis not present

## 2015-12-03 DIAGNOSIS — M9901 Segmental and somatic dysfunction of cervical region: Secondary | ICD-10-CM | POA: Diagnosis not present

## 2015-12-03 DIAGNOSIS — M5030 Other cervical disc degeneration, unspecified cervical region: Secondary | ICD-10-CM | POA: Diagnosis not present

## 2015-12-03 DIAGNOSIS — L723 Sebaceous cyst: Secondary | ICD-10-CM | POA: Diagnosis not present

## 2015-12-03 DIAGNOSIS — L821 Other seborrheic keratosis: Secondary | ICD-10-CM | POA: Diagnosis not present

## 2015-12-03 DIAGNOSIS — L237 Allergic contact dermatitis due to plants, except food: Secondary | ICD-10-CM | POA: Diagnosis not present

## 2015-12-04 ENCOUNTER — Encounter: Payer: Self-pay | Admitting: Physician Assistant

## 2015-12-04 ENCOUNTER — Ambulatory Visit (INDEPENDENT_AMBULATORY_CARE_PROVIDER_SITE_OTHER): Payer: PPO | Admitting: Physician Assistant

## 2015-12-04 VITALS — BP 117/63 | HR 68 | Temp 97.0°F | Ht 71.0 in | Wt 191.8 lb

## 2015-12-04 DIAGNOSIS — M25511 Pain in right shoulder: Secondary | ICD-10-CM

## 2015-12-04 MED ORDER — VALACYCLOVIR HCL 1 G PO TABS: 1000.0000 mg | ORAL_TABLET | Freq: Three times a day (TID) | ORAL | Status: DC

## 2015-12-04 NOTE — Progress Notes (Signed)
Subjective:     Patient ID: Ronald Russell, male   DOB: 09-24-1934, 81 y.o.   MRN: [id number]  HPI Pt here for f/u of R shoulder pain Seen last week and placed on Tylenol arthritis since he cannot take NSAIDS Pt with continued pain He also had a rash that involved the entire  R arm He thought this may be related to poison sumac but no other rash area identified Tylenol has helped some  Review of Systems + pain radiating down the entire arm + rash with vesicles + assoc intermit numbness    Objective:   Physical Exam Appear to be healing areas of vesicles that run in dermatomal pattern down the R arm to the thenar area No surrounding induration erythema FROM of the R shoulder passive Good strength that is equal bilat Active ROM is sl down on the R shoulder Grip strength equal Pulses/sensory good    Assessment:     1. Pain in joint of right shoulder        Plan:     Suspect this was a shingle outbreak instead of poison sumac Since he is still symptomatic Valtrex 1 gm tid x 1 wk ROM exercises for the R shoulder reviewed Nl course reviewed with pt F/U prn

## 2015-12-04 NOTE — Patient Instructions (Signed)

## 2015-12-05 ENCOUNTER — Ambulatory Visit (INDEPENDENT_AMBULATORY_CARE_PROVIDER_SITE_OTHER): Payer: PPO | Admitting: Family Medicine

## 2015-12-05 ENCOUNTER — Encounter: Payer: Self-pay | Admitting: Family Medicine

## 2015-12-05 ENCOUNTER — Telehealth: Payer: Self-pay | Admitting: Family Medicine

## 2015-12-05 VITALS — BP 123/59 | HR 69 | Temp 97.3°F | Ht 71.0 in | Wt 192.6 lb

## 2015-12-05 DIAGNOSIS — B029 Zoster without complications: Secondary | ICD-10-CM | POA: Diagnosis not present

## 2015-12-05 MED ORDER — TRAMADOL HCL 50 MG PO TABS: 25.0000 mg | ORAL_TABLET | Freq: Three times a day (TID) | ORAL | Status: DC | PRN

## 2015-12-05 NOTE — Telephone Encounter (Signed)
appt made for today 

## 2015-12-05 NOTE — Progress Notes (Signed)
   HPI  Patient presents today here was continued shingles related pain.  Patient has been seen several times in the last few weeks, he started out with a rash and right shoulder pain. It slowly progressed and worsened and became the appearance of contact dermatitis. The last visit it became more evident that it was actually shingles. He's been started on bowel tracts and his rash is improving some, he continues to have severe pain.  He has severe reaction to codeine and hydrocodone, as well as other morphine-based medications. He has tolerated tramadol previously.  On another note he states that his temporal pain is completely resolved. He is very happy with the relief that he is getting.  PMH: Smoking status noted ROS: Per HPI  Objective: BP 123/59 mmHg  Pulse 69  Temp(Src) 97.3 F (36.3 C) (Oral)  Ht '5\' 11"'$  (1.803 m)  Wt 192 lb 9.6 oz (87.363 kg)  BMI 26.87 kg/m2 Gen: NAD, alert, cooperative with exam HEENT: NCAT CV: RRR, good S1/S2, no murmur Resp: CTABL, no wheezes, non-labored Ext: No edema, warm Neuro: Alert and oriented, No gross deficits Skin Right arm with patchy erythema and several areas of heme crusting consistent with shingles in the C6 distribution. Extending from his short sleeve shirt sleeve to his thumb, patient states that extends up to the shoulder.  Assessment and plan:  # Shingles Shingles related pain Tramadol, discussed careful dosing Return to clinic with any concerns    Meds ordered this encounter  Medications  . traMADol (ULTRAM) 50 MG tablet    Sig: Take 0.5-2 tablets (25-100 mg total) by mouth every 8 (eight) hours as needed.    Dispense:  30 tablet    Refill:  0    Laroy Apple, MD [facility] 12/05/2015, 5:36 PM

## 2015-12-05 NOTE — Patient Instructions (Signed)
Great to see you!  Start out with a 1/2 of a pill, dont take more than 6 pills a day

## 2015-12-08 DIAGNOSIS — M9901 Segmental and somatic dysfunction of cervical region: Secondary | ICD-10-CM | POA: Diagnosis not present

## 2015-12-08 DIAGNOSIS — M5030 Other cervical disc degeneration, unspecified cervical region: Secondary | ICD-10-CM | POA: Diagnosis not present

## 2015-12-11 ENCOUNTER — Telehealth: Payer: Self-pay | Admitting: Family Medicine

## 2015-12-11 MED ORDER — VALACYCLOVIR HCL 1 G PO TABS: 1000.0000 mg | ORAL_TABLET | Freq: Three times a day (TID) | ORAL | Status: DC

## 2015-12-11 NOTE — Telephone Encounter (Signed)
Refilled,   Pt on systemic steroids so we could consider him immune compromised.  Slowly healing lesions.   Ronald Apple, MD [facility] 12/11/2015, 9:59 AM

## 2015-12-11 NOTE — Telephone Encounter (Signed)
Pt aware.

## 2015-12-17 ENCOUNTER — Telehealth: Payer: Self-pay | Admitting: Neurology

## 2015-12-17 ENCOUNTER — Telehealth: Payer: Self-pay | Admitting: Family Medicine

## 2015-12-17 NOTE — Telephone Encounter (Signed)
Dr Ahern- please advise 

## 2015-12-17 NOTE — Telephone Encounter (Signed)
Patient is taking the Tramdol and it does seem to be helping some with his pain, he is taking 1/2 of a tablet QID and also taking Tylenol arthritis QID.  He said he is experiencing some nausea when he takes the Tramadol and was wondering if he can take Zofran that he has on hand to see if it will help and not make him sedated.  I informed him he should go ahead with the Zofran and if there was any problem to let us know.  He wanted to make you aware of his progress.  He will call us back tomorrow to let us know how he is doing.

## 2015-12-17 NOTE — Telephone Encounter (Signed)
Patient is calling to see if he can discontinue methylPREDNISolone (MEDROL) 2 MG tablet long enough for the Shingles to heal. He has had the Shingles for about a month. Please call to advise.

## 2015-12-17 NOTE — Telephone Encounter (Signed)
Yes, he can stop the steroids and we can monitor his headache thanks

## 2015-12-18 ENCOUNTER — Telehealth: Payer: Self-pay | Admitting: Family Medicine

## 2015-12-18 MED ORDER — PROMETHAZINE HCL 12.5 MG PO TABS
12.5000 mg | ORAL_TABLET | Freq: Three times a day (TID) | ORAL | Status: DC | PRN
Start: 2015-12-18 — End: 2016-02-07

## 2015-12-18 NOTE — Telephone Encounter (Signed)
Patient aware, he has prescription for Phenergan at home and will start that.

## 2015-12-18 NOTE — Telephone Encounter (Signed)
I have sent an Rx for Phenergan. Caution as this may make him sleepy.  Laroy Apple, MD [facility] 12/18/2015, 2:20 PM

## 2015-12-18 NOTE — Telephone Encounter (Signed)
Called pt and advised he can stop steroids per Dr Jaynee Eagles. Told him to call if he has any questions/concerns. She verbalized understanding.

## 2015-12-18 NOTE — Telephone Encounter (Signed)
Appreciate FYI- agree with zofran as needed  Laroy Apple, MD [facility] 12/18/2015, 7:38 AM

## 2015-12-18 NOTE — Telephone Encounter (Signed)
Patient is still experiencing nausea even with Zofran, he has been unable to eat or drink anything.  He wants [place] to call something for the nausea in to the [facility] in [place].  Please advise.

## 2015-12-19 ENCOUNTER — Encounter (HOSPITAL_COMMUNITY): Payer: Self-pay | Admitting: Emergency Medicine

## 2015-12-19 ENCOUNTER — Ambulatory Visit: Payer: PPO

## 2015-12-19 ENCOUNTER — Emergency Department (HOSPITAL_COMMUNITY)
Admission: EM | Admit: 2015-12-19 | Discharge: 2015-12-20 | Disposition: A | Discharge status: Home / Self Care | Payer: PPO | Attending: Emergency Medicine | Admitting: Emergency Medicine

## 2015-12-19 ENCOUNTER — Telehealth: Payer: Self-pay | Admitting: Family Medicine

## 2015-12-19 DIAGNOSIS — J449 Chronic obstructive pulmonary disease, unspecified: Secondary | ICD-10-CM | POA: Insufficient documentation

## 2015-12-19 DIAGNOSIS — Z87891 Personal history of nicotine dependence: Secondary | ICD-10-CM | POA: Insufficient documentation

## 2015-12-19 DIAGNOSIS — R112 Nausea with vomiting, unspecified: Secondary | ICD-10-CM | POA: Diagnosis not present

## 2015-12-19 DIAGNOSIS — I10 Essential (primary) hypertension: Secondary | ICD-10-CM | POA: Diagnosis not present

## 2015-12-19 DIAGNOSIS — R110 Nausea: Secondary | ICD-10-CM | POA: Diagnosis not present

## 2015-12-19 DIAGNOSIS — Z8551 Personal history of malignant neoplasm of bladder: Secondary | ICD-10-CM | POA: Insufficient documentation

## 2015-12-19 DIAGNOSIS — R531 Weakness: Secondary | ICD-10-CM

## 2015-12-19 DIAGNOSIS — M79644 Pain in right finger(s): Secondary | ICD-10-CM | POA: Diagnosis not present

## 2015-12-19 DIAGNOSIS — I6789 Other cerebrovascular disease: Secondary | ICD-10-CM | POA: Diagnosis not present

## 2015-12-19 LAB — COMPREHENSIVE METABOLIC PANEL
ALT: 16 U/L — ABNORMAL LOW (ref 17–63)
AST: 16 U/L (ref 15–41)
Albumin: 2.9 g/dL — ABNORMAL LOW (ref 3.5–5.0)
Alkaline Phosphatase: 46 U/L (ref 38–126)
Anion gap: 4 — ABNORMAL LOW (ref 5–15)
BUN: 12 mg/dL (ref 6–20)
CO2: 28 mmol/L (ref 22–32)
Calcium: 8.7 mg/dL — ABNORMAL LOW (ref 8.9–10.3)
Chloride: 106 mmol/L (ref 101–111)
Creatinine, Ser: 1.29 mg/dL — ABNORMAL HIGH (ref 0.61–1.24)
GFR calc Af Amer: 59 mL/min — ABNORMAL LOW (ref >60)
GFR calc non Af Amer: 51 mL/min — ABNORMAL LOW (ref >60)
Glucose, Bld: 97 mg/dL (ref 65–99)
Potassium: 3.6 mmol/L (ref 3.5–5.1)
Sodium: 138 mmol/L (ref 135–145)
Total Bilirubin: 1.6 mg/dL — ABNORMAL HIGH (ref 0.3–1.2)
Total Protein: 6 g/dL — ABNORMAL LOW (ref 6.5–8.1)

## 2015-12-19 LAB — CBC WITH DIFFERENTIAL/PLATELET
Basophils Absolute: 0 10*3/uL (ref 0.0–0.1)
Basophils Relative: 1 %
Eosinophils Absolute: 0.3 10*3/uL (ref 0.0–0.7)
Eosinophils Relative: 4 %
HCT: 39.5 % (ref 39.0–52.0)
Hemoglobin: 13.7 g/dL (ref 13.0–17.0)
Lymphocytes Relative: 9 %
Lymphs Abs: 0.5 10*3/uL — ABNORMAL LOW (ref 0.7–4.0)
MCH: 33.3 pg (ref 26.0–34.0)
MCHC: 34.7 g/dL (ref 30.0–36.0)
MCV: 95.9 fL (ref 78.0–100.0)
Monocytes Absolute: 0.9 10*3/uL (ref 0.1–1.0)
Monocytes Relative: 14 %
Neutro Abs: 4.5 10*3/uL (ref 1.7–7.7)
Neutrophils Relative %: 72 %
Platelets: 122 10*3/uL — ABNORMAL LOW (ref 150–400)
RBC: 4.12 MIL/uL — ABNORMAL LOW (ref 4.22–5.81)
RDW: 17 % — ABNORMAL HIGH (ref 11.5–15.5)
WBC: 6.2 10*3/uL (ref 4.0–10.5)

## 2015-12-19 LAB — LIPASE, BLOOD: Lipase: 31 U/L (ref 11–51)

## 2015-12-19 LAB — URINALYSIS, ROUTINE W REFLEX MICROSCOPIC
Bilirubin Urine: NEGATIVE
Glucose, UA: NEGATIVE mg/dL
Hgb urine dipstick: NEGATIVE
Ketones, ur: NEGATIVE mg/dL
Leukocytes, UA: NEGATIVE
Nitrite: NEGATIVE
Protein, ur: NEGATIVE mg/dL
Specific Gravity, Urine: 1.021 (ref 1.005–1.030)
pH: 6 (ref 5.0–8.0)

## 2015-12-19 LAB — I-STAT CG4 LACTIC ACID, ED: Lactic Acid, Venous: 0.96 mmol/L (ref 0.5–1.9)

## 2015-12-19 MED ORDER — ACETAMINOPHEN 500 MG PO TABS
1000.0000 mg | ORAL_TABLET | Freq: Once | ORAL | Status: AC
  Administered 2015-12-19: 1000 mg via ORAL
  Filled 2015-12-19: qty 2

## 2015-12-19 MED ORDER — ONDANSETRON HCL 4 MG/2ML IJ SOLN
4.0000 mg | Freq: Once | INTRAMUSCULAR | Status: AC
  Administered 2015-12-19: 4 mg via INTRAVENOUS
  Filled 2015-12-19: qty 2

## 2015-12-19 MED ORDER — SODIUM CHLORIDE 0.9 % IV BOLUS (SEPSIS)
1000.0000 mL | Freq: Once | INTRAVENOUS | Status: AC
  Administered 2015-12-19: 1000 mL via INTRAVENOUS

## 2015-12-19 NOTE — Telephone Encounter (Signed)
Patient is still experiencing nausea and abdominal pain, burning in stomach, appointment made for patient to see Dr. [name] today at 5:00 pm

## 2015-12-19 NOTE — ED Notes (Signed)
PT reports nausea and vomiting that started late Tuesday night. PT reports he has been having issues with weakness for a "long time" PT's PCP recently discovered that his vitamin D is low. PT took a vitamin D supplement for the first time Tuesday and believes that this upset his stomach. PT reports nausea has persisted since Tuesday, but he has not vomited in the past 24 hours. PT believes this is because he has barely been eating. PT reports he had a small BM last night that was loose. PT denies blood in vomit and stool. PT has had shingles on right arm for 3 weeks. PT is taking tylenol and tramadol for pain. PT reports his temp was 100.6 earlier, but notes he last took tylenol at 0930.
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
## 2015-12-23 ENCOUNTER — Telehealth: Payer: Self-pay | Admitting: Family Medicine

## 2015-12-23 NOTE — Telephone Encounter (Signed)
Spoke to pt and he states his panic attacks are not any better. Offered an appt and pt declined stating he didn't have a ride. Pt was advised to call back to schedule appt if symptoms continue or worsen. Pt voiced understanding.

## 2015-12-24 ENCOUNTER — Telehealth: Payer: Self-pay | Admitting: Family Medicine

## 2015-12-24 NOTE — Telephone Encounter (Signed)
Rash is gone but pt is having continued pain and nausea appt scheduled for reevaluation

## 2015-12-25 ENCOUNTER — Emergency Department (HOSPITAL_COMMUNITY): Payer: PPO

## 2015-12-25 ENCOUNTER — Encounter (HOSPITAL_COMMUNITY): Payer: Self-pay | Admitting: Emergency Medicine

## 2015-12-25 ENCOUNTER — Ambulatory Visit (INDEPENDENT_AMBULATORY_CARE_PROVIDER_SITE_OTHER): Payer: PPO

## 2015-12-25 ENCOUNTER — Emergency Department (HOSPITAL_COMMUNITY)
Admission: EM | Admit: 2015-12-25 | Discharge: 2015-12-25 | Disposition: A | Discharge status: Home / Self Care | Payer: PPO | Attending: Emergency Medicine | Admitting: Emergency Medicine

## 2015-12-25 ENCOUNTER — Encounter: Payer: Self-pay | Admitting: Family Medicine

## 2015-12-25 ENCOUNTER — Ambulatory Visit (INDEPENDENT_AMBULATORY_CARE_PROVIDER_SITE_OTHER): Payer: PPO | Admitting: Family Medicine

## 2015-12-25 VITALS — BP 112/69 | HR 78 | Temp 97.3°F | Ht 71.0 in | Wt 181.2 lb

## 2015-12-25 DIAGNOSIS — R109 Unspecified abdominal pain: Secondary | ICD-10-CM

## 2015-12-25 DIAGNOSIS — J449 Chronic obstructive pulmonary disease, unspecified: Secondary | ICD-10-CM | POA: Diagnosis not present

## 2015-12-25 DIAGNOSIS — K8020 Calculus of gallbladder without cholecystitis without obstruction: Secondary | ICD-10-CM | POA: Diagnosis not present

## 2015-12-25 DIAGNOSIS — R0602 Shortness of breath: Secondary | ICD-10-CM | POA: Diagnosis not present

## 2015-12-25 DIAGNOSIS — R1030 Lower abdominal pain, unspecified: Secondary | ICD-10-CM | POA: Diagnosis not present

## 2015-12-25 DIAGNOSIS — E876 Hypokalemia: Secondary | ICD-10-CM | POA: Diagnosis not present

## 2015-12-25 DIAGNOSIS — R110 Nausea: Secondary | ICD-10-CM

## 2015-12-25 DIAGNOSIS — Z8551 Personal history of malignant neoplasm of bladder: Secondary | ICD-10-CM | POA: Diagnosis not present

## 2015-12-25 DIAGNOSIS — K5790 Diverticulosis of intestine, part unspecified, without perforation or abscess without bleeding: Secondary | ICD-10-CM | POA: Diagnosis not present

## 2015-12-25 DIAGNOSIS — Z87891 Personal history of nicotine dependence: Secondary | ICD-10-CM | POA: Diagnosis not present

## 2015-12-25 DIAGNOSIS — I10 Essential (primary) hypertension: Secondary | ICD-10-CM | POA: Diagnosis not present

## 2015-12-25 LAB — CBC WITH DIFFERENTIAL/PLATELET
Basophils Absolute: 0 10*3/uL (ref 0.0–0.1)
Basophils Relative: 1 %
Eosinophils Absolute: 0.3 10*3/uL (ref 0.0–0.7)
Eosinophils Relative: 5 %
HCT: 35.8 % — ABNORMAL LOW (ref 39.0–52.0)
Hemoglobin: 12.5 g/dL — ABNORMAL LOW (ref 13.0–17.0)
Lymphocytes Relative: 17 %
Lymphs Abs: 1.1 10*3/uL (ref 0.7–4.0)
MCH: 32.6 pg (ref 26.0–34.0)
MCHC: 34.9 g/dL (ref 30.0–36.0)
MCV: 93.2 fL (ref 78.0–100.0)
Monocytes Absolute: 0.5 10*3/uL (ref 0.1–1.0)
Monocytes Relative: 8 %
Neutro Abs: 4.3 10*3/uL (ref 1.7–7.7)
Neutrophils Relative %: 69 %
Platelets: 182 10*3/uL (ref 150–400)
RBC: 3.84 MIL/uL — ABNORMAL LOW (ref 4.22–5.81)
RDW: 16.8 % — ABNORMAL HIGH (ref 11.5–15.5)
WBC: 6.2 10*3/uL (ref 4.0–10.5)

## 2015-12-25 LAB — COMPREHENSIVE METABOLIC PANEL
ALT: 18 U/L (ref 17–63)
AST: 22 U/L (ref 15–41)
Albumin: 2.9 g/dL — ABNORMAL LOW (ref 3.5–5.0)
Alkaline Phosphatase: 41 U/L (ref 38–126)
Anion gap: 7 (ref 5–15)
BUN: 9 mg/dL (ref 6–20)
CO2: 22 mmol/L (ref 22–32)
Calcium: 8.7 mg/dL — ABNORMAL LOW (ref 8.9–10.3)
Chloride: 106 mmol/L (ref 101–111)
Creatinine, Ser: 1.19 mg/dL (ref 0.61–1.24)
GFR calc Af Amer: >60 mL/min (ref >60)
GFR calc non Af Amer: 56 mL/min — ABNORMAL LOW (ref >60)
Glucose, Bld: 107 mg/dL — ABNORMAL HIGH (ref 65–99)
Potassium: 3 mmol/L — ABNORMAL LOW (ref 3.5–5.1)
Sodium: 135 mmol/L (ref 135–145)
Total Bilirubin: 1.1 mg/dL (ref 0.3–1.2)
Total Protein: 5.9 g/dL — ABNORMAL LOW (ref 6.5–8.1)

## 2015-12-25 LAB — URINALYSIS, ROUTINE W REFLEX MICROSCOPIC
Glucose, UA: NEGATIVE mg/dL
Hgb urine dipstick: NEGATIVE
Ketones, ur: NEGATIVE mg/dL
Leukocytes, UA: NEGATIVE
Nitrite: NEGATIVE
Protein, ur: NEGATIVE mg/dL
Specific Gravity, Urine: 1.035 — ABNORMAL HIGH (ref 1.005–1.030)
pH: 5.5 (ref 5.0–8.0)

## 2015-12-25 LAB — I-STAT TROPONIN, ED: Troponin i, poc: 0 ng/mL (ref 0.00–0.08)

## 2015-12-25 LAB — LIPASE, BLOOD: Lipase: 32 U/L (ref 11–51)

## 2015-12-25 MED ORDER — POTASSIUM CHLORIDE CRYS ER 20 MEQ PO TBCR
20.0000 meq | EXTENDED_RELEASE_TABLET | Freq: Once | ORAL | Status: AC
  Administered 2015-12-25: 20 meq via ORAL
  Filled 2015-12-25: qty 1

## 2015-12-25 NOTE — ED Notes (Signed)
EMS - Patient coming from home with c/o of nausea x 1 week, denies vomiting.  Patient is alert and oriented.  Vital signs stable with EMS.  No solid food x 1 week.

## 2015-12-25 NOTE — Progress Notes (Signed)
   HPI  Patient presents today here for ER follow-up.  Patient went to the emergency room about a week ago for nausea and vomiting started after taking vitamin D for the first time. He states that since that time he's been weak, tired, and not able to tolerate fluids by mouth.  He describes that he is only eaten a small amount of eggs in the last 2 days, he also states that his only been able tolerate about a quarter cup of ginger ale today. He reports reduced urinary output. He endorses severe weakness, shaking, fatigue,  He states that a day or two after going to the ed he fell landing on his left side. Since that time he's been even more short of breath, and has continuous left-sided chest pain especially with deep inspiration.  His shingles related pain is controlled with Tylenol. He has stopped steroids on the advice of his neurologist, (appreciate his recommendations and I agree.)  PMH: Smoking status noted ROS: Per HPI  Objective: BP 112/69 mmHg  Pulse 78  Temp(Src) 97.3 F (36.3 C) (Oral)  Ht '5\' 11"'$  (1.803 m)  Wt 181 lb 3.2 oz (82.192 kg)  BMI 25.28 kg/m2  SpO2 95% Gen: Moderate distress, lying on the bed, then very weak appearing as he moves. HEENT: NCAT CV: RRR, good S1/S2, no murmur Resp: Nonlabored, coarse breath sounds scattered Abd: Positive bowel sounds, soft, mild tenderness to palpation throughout, no organomegaly Ext: No edema, warm Neuro: Alert and oriented, No gross deficits  Assessment and plan:  # Nausea, shortness of breath Patient with persistent symptoms over last week. His chest x-ray is clear today He states that he's not able to tolerate fluids at home and has reduced urinary output. He states that he is very weak and cannot take care of himself. I recommended that he be evaluated in the emergency room, I do not see a reason to send him by ambulance today I think he may likely benefit from fluids. He will get a friend to take him.   Laroy Apple, MD [facility] 12/25/2015, 10:53 AM

## 2015-12-25 NOTE — Patient Instructions (Signed)
Please go to the emergency room for evaluation. 
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
## 2015-12-25 NOTE — Discharge Instructions (Signed)
Hypokalemia °Hypokalemia means that the amount of potassium in the blood is lower than normal. Potassium is a chemical, called an electrolyte, that helps regulate the amount of fluid in the body. It also stimulates muscle contraction and helps nerves function properly. Most of the body's potassium is inside of cells, and only a very small amount is in the blood. Because the amount in the blood is so small, minor changes can be life-threatening. °CAUSES °· Antibiotics. °· Diarrhea or vomiting. °· Using laxatives too much, which can cause diarrhea. °· Chronic kidney disease. °· Water pills (diuretics). °· Eating disorders (bulimia). °· Low magnesium level. °· Sweating a lot. °SIGNS AND SYMPTOMS °· Weakness. °· Constipation. °· Fatigue. °· Muscle cramps. °· Mental confusion. °· Skipped heartbeats or irregular heartbeat (palpitations). °· Tingling or numbness. °DIAGNOSIS  °Your health care provider can diagnose hypokalemia with blood tests. In addition to checking your potassium level, your health care provider may also check other lab tests. °TREATMENT °Hypokalemia can be treated with potassium supplements taken by mouth or adjustments in your current medicines. If your potassium level is very low, you may need to get potassium through a vein (IV) and be monitored in the hospital. A diet high in potassium is also helpful. Foods high in potassium are: °· Nuts, such as peanuts and pistachios. °· Seeds, such as sunflower seeds and pumpkin seeds. °· Peas, lentils, and lima beans. °· Whole grain and bran cereals and breads. °· Fresh fruit and vegetables, such as apricots, avocado, bananas, cantaloupe, kiwi, oranges, tomatoes, asparagus, and potatoes. °· Orange and tomato juices. °· Red meats. °· Fruit yogurt. °HOME CARE INSTRUCTIONS °· Take all medicines as prescribed by your health care provider. °· Maintain a healthy diet by including nutritious food, such as fruits, vegetables, nuts, whole grains, and lean meats. °· If  you are taking a laxative, be sure to follow the directions on the label. °SEEK MEDICAL CARE IF: °· Your weakness gets worse. °· You feel your heart pounding or racing. °· You are vomiting or having diarrhea. °· You are diabetic and having trouble keeping your blood glucose in the normal range. °SEEK IMMEDIATE MEDICAL CARE IF: °· You have chest pain, shortness of breath, or dizziness. °· You are vomiting or having diarrhea for more than 2 days. °· You faint. °MAKE SURE YOU:  °· Understand these instructions. °· Will watch your condition. °· Will get help right away if you are not doing well or get worse. °  °This information is not intended to replace advice given to you by your health care provider. Make sure you discuss any questions you have with your health care provider. °  °Document Released: 05/31/2005 Document Revised: 06/21/2014 Document Reviewed: 12/01/2012 °Elsevier Interactive Patient Education ©2016 [employer] °Nausea and Vomiting °Nausea is a sick feeling that often comes before throwing up (vomiting). Vomiting is a reflex where stomach contents come out of your mouth. Vomiting can cause severe loss of body fluids (dehydration). Children and elderly adults can become dehydrated quickly, especially if they also have diarrhea. Nausea and vomiting are symptoms of a condition or disease. It is important to find the cause of your symptoms. °CAUSES  °· Direct irritation of the stomach lining. This irritation can result from increased acid production (gastroesophageal reflux disease), infection, food poisoning, taking certain medicines (such as nonsteroidal anti-inflammatory drugs), alcohol use, or tobacco use. °· Signals from the brain. These signals could be caused by a headache, heat exposure, an inner ear disturbance, increased pressure in the brain   from injury, infection, a tumor, or a concussion, pain, emotional stimulus, or metabolic problems. °· An obstruction in the gastrointestinal tract (bowel  obstruction). °· Illnesses such as diabetes, hepatitis, gallbladder problems, appendicitis, kidney problems, cancer, sepsis, atypical symptoms of a heart attack, or eating disorders. °· Medical treatments such as chemotherapy and radiation. °· Receiving medicine that makes you sleep (general anesthetic) during surgery. °DIAGNOSIS °Your caregiver may ask for tests to be done if the problems do not improve after a few days. Tests may also be done if symptoms are severe or if the reason for the nausea and vomiting is not clear. Tests may include: °· Urine tests. °· Blood tests. °· Stool tests. °· Cultures (to look for evidence of infection). °· X-rays or other imaging studies. °Test results can help your caregiver make decisions about treatment or the need for additional tests. °TREATMENT °You need to stay well hydrated. Drink frequently but in small amounts. You may wish to drink water, sports drinks, clear broth, or eat frozen ice pops or gelatin dessert to help stay hydrated. When you eat, eating slowly may help prevent nausea. There are also some antinausea medicines that may help prevent nausea. °HOME CARE INSTRUCTIONS  °· Take all medicine as directed by your caregiver. °· If you do not have an appetite, do not force yourself to eat. However, you must continue to drink fluids. °· If you have an appetite, eat a normal diet unless your caregiver tells you differently. °¨ Eat a variety of complex carbohydrates (rice, wheat, potatoes, bread), lean meats, yogurt, fruits, and vegetables. °¨ Avoid high-fat foods because they are more difficult to digest. °· Drink enough water and fluids to keep your urine clear or pale yellow. °· If you are dehydrated, ask your caregiver for specific rehydration instructions. Signs of dehydration may include: °¨ Severe thirst. °¨ Dry lips and mouth. °¨ Dizziness. °¨ Dark urine. °¨ Decreasing urine frequency and amount. °¨ Confusion. °¨ Rapid breathing or pulse. °SEEK IMMEDIATE MEDICAL  CARE IF:  °· You have blood or brown flecks (like coffee grounds) in your vomit. °· You have black or bloody stools. °· You have a severe headache or stiff neck. °· You are confused. °· You have severe abdominal pain. °· You have chest pain or trouble breathing. °· You do not urinate at least once every 8 hours. °· You develop cold or clammy skin. °· You continue to vomit for longer than 24 to 48 hours. °· You have a fever. °MAKE SURE YOU:  °· Understand these instructions. °· Will watch your condition. °· Will get help right away if you are not doing well or get worse. °  °This information is not intended to replace advice given to you by your health care provider. Make sure you discuss any questions you have with your health care provider. °  °Document Released: 05/31/2005 Document Revised: 08/23/2011 Document Reviewed: 10/28/2010 °Elsevier Interactive Patient Education ©2016 [employer] ° °

## 2015-12-25 NOTE — ED Notes (Signed)
Pt departed in NAD, escorted in wheelchair.

## 2015-12-29 ENCOUNTER — Telehealth: Payer: Self-pay | Admitting: Family Medicine

## 2015-12-29 NOTE — Telephone Encounter (Signed)
[facility] and they said his potassium was low. Patient states that they did not give him any instructions on what to do or a rx. Patient also states that he was put on a liquid diet but they did not state how long to be on it. Patient would like your advise on what to do. Patient aware you will be back in the office tomorrow.

## 2015-12-30 MED ORDER — POTASSIUM CHLORIDE CRYS ER 20 MEQ PO TBCR: 20.0000 meq | EXTENDED_RELEASE_TABLET | Freq: Two times a day (BID) | ORAL | Status: DC

## 2015-12-30 NOTE — Telephone Encounter (Signed)
Moderate hypokalemia  Replace X 1 month  Laroy Apple, MD [facility] 12/30/2015, 7:36 AM

## 2015-12-30 NOTE — Telephone Encounter (Signed)
Patient aware that medication has been sent to pharmacy 

## 2016-01-06 ENCOUNTER — Telehealth: Payer: Self-pay | Admitting: Family Medicine

## 2016-01-06 ENCOUNTER — Encounter: Payer: Self-pay | Admitting: Family Medicine

## 2016-01-06 ENCOUNTER — Ambulatory Visit (INDEPENDENT_AMBULATORY_CARE_PROVIDER_SITE_OTHER): Payer: PPO

## 2016-01-06 ENCOUNTER — Ambulatory Visit (INDEPENDENT_AMBULATORY_CARE_PROVIDER_SITE_OTHER): Payer: PPO | Admitting: Family Medicine

## 2016-01-06 VITALS — BP 107/69 | HR 76 | Temp 97.7°F | Ht 71.0 in | Wt 181.0 lb

## 2016-01-06 DIAGNOSIS — R110 Nausea: Secondary | ICD-10-CM

## 2016-01-06 DIAGNOSIS — R1084 Generalized abdominal pain: Secondary | ICD-10-CM

## 2016-01-06 DIAGNOSIS — K529 Noninfective gastroenteritis and colitis, unspecified: Secondary | ICD-10-CM | POA: Diagnosis not present

## 2016-01-06 DIAGNOSIS — E559 Vitamin D deficiency, unspecified: Secondary | ICD-10-CM

## 2016-01-06 DIAGNOSIS — E876 Hypokalemia: Secondary | ICD-10-CM

## 2016-01-06 LAB — MICROSCOPIC EXAMINATION: Epithelial Cells (non renal): NONE SEEN /hpf (ref 0–10)

## 2016-01-06 LAB — URINALYSIS, COMPLETE
Bilirubin, UA: NEGATIVE
Glucose, UA: NEGATIVE
Leukocytes, UA: NEGATIVE
Nitrite, UA: NEGATIVE
Specific Gravity, UA: 1.025 (ref 1.005–1.030)
Urobilinogen, Ur: 0.2 mg/dL (ref 0.2–1.0)
pH, UA: 5.5 (ref 5.0–7.5)

## 2016-01-06 MED ORDER — METRONIDAZOLE 500 MG PO TABS: 500.0000 mg | ORAL_TABLET | Freq: Three times a day (TID) | ORAL | 0 refills | Status: DC

## 2016-01-06 MED ORDER — CIPROFLOXACIN HCL 500 MG PO TABS: 500.0000 mg | ORAL_TABLET | Freq: Two times a day (BID) | ORAL | 0 refills | Status: DC

## 2016-01-06 MED ORDER — DICYCLOMINE HCL 20 MG PO TABS: 20.0000 mg | ORAL_TABLET | Freq: Four times a day (QID) | ORAL | 0 refills | Status: DC

## 2016-01-06 NOTE — Telephone Encounter (Signed)
Appt made to see Dr. Livia Snellen at 12:25 on 01/06/16

## 2016-01-06 NOTE — Progress Notes (Signed)
Subjective:  Patient ID: Ronald Russell, male    DOB: Nov 14, 1934  Age: 81 y.o. MRN: [id number]  CC: Abdominal Pain (continued, persistent diarrhea )   HPI MARKEVIUS TROMBETTA presents for continued Abdominal pain. This is been ongoing since approximately the fifth of this month. He took a 50,000 unit vitamin D pill and felt a burning sensation spread through the epigastrium and upper abdomen. For the following 8 days he had nausea and discomfort through the upper abdomen. His appetite fell off and he developed nausea. He had not had anything he would consider frank pain until this morning. 12 days ago he went to the emergency department. While there he had abdominal ultrasound showing chronic gallstones. CT of the abdomen showed diverticulosis. His potassium was found to be low at 3.0. He increased his Potassium supplement. However his symptoms have continued. For the last several days he reports that he has no appetite and significant nausea. Morning. Starting at about 2 in the afternoon he can take some liquids. Yesterday late morning he was able to eat a scrambled egg. The pain he is having now is moderately severe and is per umbilical to left lower quadrant in distribution moderately severe in severity aching in character. He reports that he has had about 4 very small bowel movements today altogether about a cup full of stool is what he would estimate. He says it is tiny little pellets may be a couple of inches long.   History Corrado has a past medical history of Allergy; Anxiety; Bladder cancer ([place]) (2010); Cataract; Chronic bronchitis ([place]); Clotting disorder ([place]); Exertional shortness of breath; GERD (gastroesophageal reflux disease); History of blood transfusion (1949); HOH (hard of hearing); Hypertension; Ocular migraine; Pneumonia; PONV (postoperative nausea and vomiting); Seasonal allergies; and Temporal arteritis ([place]).   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   His family history includes Alzheimer's disease in his father; Cancer in his mother.He reports that he has quit smoking. His smoking use included Cigarettes. He has a 2.25 pack-year smoking history. He has never used smokeless tobacco. He reports that he does not drink alcohol or use drugs.    ROS Review of Systems  Constitutional: Negative for chills, diaphoresis, fever and unexpected weight change.  HENT: Negative for rhinorrhea and trouble swallowing.   Respiratory: Negative for cough, chest tightness and shortness of breath.   Cardiovascular: Negative for chest pain.  Gastrointestinal: Positive for abdominal pain, diarrhea and nausea. Negative for abdominal distention, blood in stool, constipation, rectal pain and vomiting.  Genitourinary: Negative for dysuria, flank pain and hematuria.  Musculoskeletal: Negative for arthralgias and joint swelling.  Skin: Negative for rash.  Neurological: Negative for syncope and headaches.    Objective:  BP 107/69 (BP Location: Left Arm, Patient Position: Sitting, Cuff Size: Normal)   Pulse 76   Temp 97.7 F (36.5 C) (Oral)   Ht '5\' 11"'$  (1.803 m)   Wt 181 lb (82.1 kg)   SpO2 97%   BMI 25.24 kg/m   BP Readings from Last 3 Encounters:  01/06/16 107/69  12/25/15 115/59  12/25/15 112/69    Wt Readings from Last 3 Encounters:  01/06/16 181 lb (82.1 kg)  12/25/15 174 lb (78.9 kg)  12/25/15 181 lb 3.2 oz (82.2 kg)     Physical Exam  Constitutional: He  is oriented to person, place, and time. He appears well-developed and well-nourished. He appears distressed.  HENT:  Head: Normocephalic and atraumatic.  Right Ear: External ear normal.  Left Ear: External ear normal.  Nose: Nose normal.  Mouth/Throat: Oropharynx is clear and  moist.  Eyes: Conjunctivae and EOM are normal. Pupils are equal, round, and reactive to light.  Neck: Normal range of motion. Neck supple. No thyromegaly present.  Cardiovascular: Normal rate, regular rhythm and normal heart sounds.   No murmur heard. Pulmonary/Chest: Effort normal and breath sounds normal. No respiratory distress. He has no wheezes. He has no rales.  Abdominal: Soft. Bowel sounds are normal. He exhibits distension. There is tenderness (moderate periumbilical to LLQ).  Genitourinary: Rectum normal.  Musculoskeletal: Normal range of motion.  Lymphadenopathy:    He has no cervical adenopathy.  Neurological: He is alert and oriented to person, place, and time. He has normal reflexes.  Skin: Skin is warm and dry.  Psychiatric: He has a normal mood and affect. His behavior is normal. Judgment and thought content normal.     Lab Results  Component Value Date   WBC 13.9 (H) 01/06/2016   HGB 12.5 (L) 12/25/2015   HCT 38.0 01/06/2016   PLT 174 01/06/2016   GLUCOSE 96 01/06/2016   CHOL 178 06/26/2015   TRIG 197 (H) 06/26/2015   HDL 35 (L) 06/26/2015   LDLCALC 104 (H) 06/26/2015   ALT 14 01/06/2016   AST 16 01/06/2016   NA 143 01/06/2016   K 3.5 01/06/2016   CL 103 01/06/2016   CREATININE 1.03 01/06/2016   BUN 9 01/06/2016   CO2 22 01/06/2016   TSH 4.093 02/13/2013   INR 0.91 07/31/2011   HGBA1C 5.2 02/13/2013    Ct Abdomen Pelvis Wo Contrast  Result Date: 12/25/2015 CLINICAL DATA:  Nausea for 1 week EXAM: CT ABDOMEN AND PELVIS WITHOUT CONTRAST TECHNIQUE: Multidetector CT imaging of the abdomen and pelvis was performed following the standard protocol without IV contrast. COMPARISON:  12/29/2011 FINDINGS: Lower chest: Minimal atelectatic changes are noted in the left medial lung base. Moderate size hiatal hernia is noted. Hepatobiliary: The liver is within normal limits. A few dependent gallstones are noted without complicating factors. Pancreas: No mass or  inflammatory process identified on this un-enhanced exam. Spleen: Within normal limits in size. Adrenals/Urinary Tract: Right adrenal gland is within normal limits. Fullness of the left adrenal gland is again seen and stable. The kidneys show no calculi or obstructive changes. A left renal cyst is again noted. Bladder is well distended. Prostatic calcifications are seen. Stomach/Bowel: No evidence of obstruction, inflammatory process, or abnormal fluid collections. The appendix is within normal limits. Scattered diverticular change is noted Vascular/Lymphatic: Aortic calcifications are noted without aneurysmal dilatation. No significant lymphadenopathy is noted. Reproductive: No mass or other significant abnormality. Other: None. Musculoskeletal:  No suspicious bone lesions identified. IMPRESSION: Diverticulosis without diverticulitis. Cholelithiasis without complicating factors. Hiatal hernia. Other chronic changes as described above. Electronically Signed   By: Inez Catalina M.D.   On: 12/25/2015 14:37   Dg Chest 2 View  Result Date: 12/25/2015 CLINICAL DATA:  Chest wall pain, shortness of breath, cough, dyspnea EXAM: CHEST  2 VIEW COMPARISON:  09/30/2015 FINDINGS: Borderline enlargement of cardiac silhouette. Atherosclerotic calcification aorta. Moderate-sized hiatal hernia. Pulmonary vascularity normal. Lungs clear. No pleural effusion or pneumothorax. Bones demineralized. IMPRESSION: Hiatal hernia. No acute abnormalities. Aortic atherosclerosis. Electronically Signed   By: Lavonia Dana M.D.   On: 12/25/2015 11:12  US Abdomen Limited Ruq  Result Date: 12/25/2015 CLINICAL DATA:  Abdominal pain for 9 days, history of bladder hands EXAM: US ABDOMEN LIMITED - RIGHT UPPER QUADRANT COMPARISON:  CT scan 12/25/2015 FINDINGS: Gallbladder: Gallstones are noted within gallbladder the largest measures 6 mm. No thickening of gallbladder wall. No sonographic Murphy's sign Common bile duct: Diameter: 3.5 mm in diameter  within normal limits. Liver: No focal lesion identified. Within normal limits in parenchymal echogenicity. IMPRESSION: 1. Gallstones are noted within gallbladder the largest measures 6 mm. No sonographic Murphy's sign. Normal CBD. Electronically Signed   By: Lahoma Crocker M.D.   On: 12/25/2015 16:24    Assessment & Plan:   Torsten was seen today for abdominal pain.  Diagnoses and all orders for this visit:  Colitis, acute -     ciprofloxacin (CIPRO) 500 MG tablet; Take 1 tablet (500 mg total) by mouth 2 (two) times daily. -     dicyclomine (BENTYL) 20 MG tablet; Take 1 tablet (20 mg total) by mouth every 6 (six) hours. As needed for abd discomfort -     Cdiff NAA+O+P+Stool Culture  Generalized abdominal pain -     CBC with Differential/Platelet -     CMP14+EGFR -     DG Abd 2 Views; Future -     Amylase -     Lipase -     Urinalysis, Complete -     Cdiff NAA+O+P+Stool Culture  Vitamin D deficiency -     VITAMIN D 25 Hydroxy (Vit-D Deficiency, Fractures) -     VITAMIN D 25 Hydroxy (Vit-D Deficiency, Fractures)  Nausea without vomiting -     Cdiff NAA+O+P+Stool Culture  Hypokalemia  Other orders -     metroNIDAZOLE (FLAGYL) 500 MG tablet; Take 1 tablet (500 mg total) by mouth 3 (three) times daily. -     Microscopic Examination    XR abd - nonspecific bowel gas pattern. No acute changes. UA - few WBC, inconclusive. CT showed diverticulosis, no overt diverticulitis, however exam is consistent with both that and colitis.  E.D. Reports reviewed as well. Sx continue, but no overt changes since previous eval. Await stool and blood results. Will start empiric txdue to his distress using Abx, antispasmodic for preliminary dx.  Mixed sx consistent with colelithiasis, but RUQ not point of maximal tenderness. Consider Hepatobiliary scan.  I am having Mr. Pew start on metroNIDAZOLE, ciprofloxacin, and dicyclomine. I am also having him maintain his atenolol, omeprazole, LORazepam,  acetaminophen, promethazine, fexofenadine, and potassium chloride SA.  Meds ordered this encounter  Medications  . metroNIDAZOLE (FLAGYL) 500 MG tablet    Sig: Take 1 tablet (500 mg total) by mouth 3 (three) times daily.    Dispense:  21 tablet    Refill:  0  . ciprofloxacin (CIPRO) 500 MG tablet    Sig: Take 1 tablet (500 mg total) by mouth 2 (two) times daily.    Dispense:  14 tablet    Refill:  0  . dicyclomine (BENTYL) 20 MG tablet    Sig: Take 1 tablet (20 mg total) by mouth every 6 (six) hours. As needed for abd discomfort    Dispense:  60 tablet    Refill:  0   OVer 1 hour was spent in evaluation with over 1/2 related to counseling, planning.  Follow-up: Return in about 5 days (around 01/11/2016), or if symptoms worsen or fail to improve.  Claretta Fraise, M.D.

## 2016-01-07 LAB — CMP14+EGFR
ALT: 14 IU/L (ref 0–44)
AST: 16 IU/L (ref 0–40)
Albumin/Globulin Ratio: 1.7 (ref 1.2–2.2)
Albumin: 3.8 g/dL (ref 3.5–4.8)
Alkaline Phosphatase: 42 IU/L (ref 39–117)
BUN/Creatinine Ratio: 9 — ABNORMAL LOW (ref 10–24)
BUN: 9 mg/dL (ref 8–27)
Bilirubin Total: 0.9 mg/dL (ref 0.0–1.2)
CO2: 22 mmol/L (ref 18–29)
Calcium: 9 mg/dL (ref 8.6–10.2)
Chloride: 103 mmol/L (ref 96–106)
Creatinine, Ser: 1.03 mg/dL (ref 0.76–1.27)
GFR calc Af Amer: 79 mL/min/{1.73_m2} (ref >59)
GFR calc non Af Amer: 69 mL/min/{1.73_m2} (ref >59)
Globulin, Total: 2.3 g/dL (ref 1.5–4.5)
Glucose: 96 mg/dL (ref 65–99)
Potassium: 3.5 mmol/L (ref 3.5–5.2)
Sodium: 143 mmol/L (ref 134–144)
Total Protein: 6.1 g/dL (ref 6.0–8.5)

## 2016-01-07 LAB — CBC WITH DIFFERENTIAL/PLATELET
Basophils Absolute: 0.1 10*3/uL (ref 0.0–0.2)
Basos: 0 %
EOS (ABSOLUTE): 0.1 10*3/uL (ref 0.0–0.4)
Eos: 1 %
Hematocrit: 38 % (ref 37.5–51.0)
Hemoglobin: 12.8 g/dL (ref 12.6–17.7)
Immature Grans (Abs): 0 10*3/uL (ref 0.0–0.1)
Immature Granulocytes: 0 %
Lymphocytes Absolute: 1.2 10*3/uL (ref 0.7–3.1)
Lymphs: 8 %
MCH: 32.6 pg (ref 26.6–33.0)
MCHC: 33.7 g/dL (ref 31.5–35.7)
MCV: 97 fL (ref 79–97)
Monocytes Absolute: 1.1 10*3/uL — ABNORMAL HIGH (ref 0.1–0.9)
Monocytes: 8 %
Neutrophils Absolute: 11.5 10*3/uL — ABNORMAL HIGH (ref 1.4–7.0)
Neutrophils: 83 %
Platelets: 174 10*3/uL (ref 150–379)
RBC: 3.93 x10E6/uL — ABNORMAL LOW (ref 4.14–5.80)
RDW: 18.6 % — ABNORMAL HIGH (ref 12.3–15.4)
WBC: 13.9 10*3/uL — ABNORMAL HIGH (ref 3.4–10.8)

## 2016-01-07 LAB — VITAMIN D 25 HYDROXY (VIT D DEFICIENCY, FRACTURES): Vit D, 25-Hydroxy: 26.7 ng/mL — ABNORMAL LOW (ref 30.0–100.0)

## 2016-01-07 LAB — LIPASE: Lipase: 35 U/L (ref 0–59)

## 2016-01-07 LAB — AMYLASE: Amylase: 79 U/L (ref 31–124)

## 2016-01-12 ENCOUNTER — Telehealth: Payer: Self-pay | Admitting: Family Medicine

## 2016-01-12 NOTE — Telephone Encounter (Signed)
Needs to be seen

## 2016-01-13 ENCOUNTER — Encounter: Payer: Self-pay | Admitting: Family

## 2016-01-13 ENCOUNTER — Ambulatory Visit (INDEPENDENT_AMBULATORY_CARE_PROVIDER_SITE_OTHER): Payer: PPO | Admitting: Family

## 2016-01-13 VITALS — BP 138/81 | HR 79 | Temp 97.2°F | Ht 71.0 in | Wt 186.0 lb

## 2016-01-13 DIAGNOSIS — Z86010 Personal history of colonic polyps: Secondary | ICD-10-CM | POA: Diagnosis not present

## 2016-01-13 DIAGNOSIS — A09 Infectious gastroenteritis and colitis, unspecified: Secondary | ICD-10-CM | POA: Diagnosis not present

## 2016-01-13 DIAGNOSIS — B029 Zoster without complications: Secondary | ICD-10-CM

## 2016-01-13 DIAGNOSIS — M316 Other giant cell arteritis: Secondary | ICD-10-CM | POA: Diagnosis not present

## 2016-01-13 DIAGNOSIS — R21 Rash and other nonspecific skin eruption: Secondary | ICD-10-CM | POA: Diagnosis not present

## 2016-01-13 DIAGNOSIS — Z8719 Personal history of other diseases of the digestive system: Secondary | ICD-10-CM | POA: Diagnosis not present

## 2016-01-13 MED ORDER — VALACYCLOVIR HCL 1 G PO TABS: 1000.0000 mg | ORAL_TABLET | Freq: Three times a day (TID) | ORAL | 0 refills | Status: DC

## 2016-01-13 NOTE — Progress Notes (Signed)
   Subjective:    Patient ID: Ronald Russell, male    DOB: 03-21-1935, 81 y.o.   MRN: [id number]  PT presents to the office with recurrent rash. Pt has been treated for poison sumac and shingles. PT states he was given two different steroid shots that seem to help, but it kept returning. PT was given valtrex 1 gram TID for 1 week and then three more days and the rash resovled. PT states this recurred about 3 days ago. Pt reports numbness and pain in his entire right arm. PT states he is having constant burning achy pain, but denies any blisters.  Rash  This is a recurrent problem. The current episode started in the past 7 days. The problem has been gradually worsening since onset. The affected locations include the right arm and right wrist. The rash is characterized by redness and pain. Past treatments include oral steroids and cold compress. The treatment provided moderate relief.      Review of Systems  Skin: Positive for rash.  All other systems reviewed and are negative.      Objective:   Physical Exam  Constitutional: He is oriented to person, place, and time. He appears well-developed and well-nourished. No distress.  HENT:  Head: Normocephalic.  Eyes: Pupils are equal, round, and reactive to light. Right eye exhibits no discharge. Left eye exhibits no discharge.  Neck: No thyromegaly present.  Cardiovascular: Normal rate, regular rhythm, normal heart sounds and intact distal pulses.   No murmur heard. Pulmonary/Chest: Effort normal and breath sounds normal. No respiratory distress. He has no wheezes.  Abdominal: Soft. Bowel sounds are normal. He exhibits no distension. There is no tenderness.  Musculoskeletal: Normal range of motion. He exhibits no edema or tenderness.  Neurological: He is alert and oriented to person, place, and time. He has normal reflexes. No cranial nerve deficit.  Skin: Skin is warm and dry. Rash noted. No erythema.  Generalized erythemas papule rash on  right upper arm and right wrist and thumb  Psychiatric: He has a normal mood and affect. His behavior is normal. Judgment and thought content normal.  Vitals reviewed.    BP 138/81 (BP Location: Left Arm, Patient Position: Sitting, Cuff Size: Normal)   Pulse 79   Temp 97.2 F (36.2 C) (Oral)   Ht '5\' 11"'$  (1.803 m)   Wt 186 lb (84.4 kg)   BMI 25.94 kg/m       Assessment & Plan:  1. Rash and nonspecific skin eruption  2. Shingles -I will treat one more time for shingles. Discussed that shingles can linger for several weeks, but if this does not improve will have to send to derm -Do not scratch -Continue tylenol prn for pain -Avoid immunocompromised and pregnant women RTO prn - valACYclovir (VALTREX) 1000 MG tablet; Take 1 tablet (1,000 mg total) by mouth 3 (three) times daily.  Dispense: 21 tablet; Refill: 0  Evelina Dun, FNP

## 2016-01-13 NOTE — Patient Instructions (Addendum)
Rash A rash is a change in the color or texture of the skin. There are many different types of rashes. You may have other problems that accompany your rash. CAUSES   Infections.  Allergic reactions. This can include allergies to pets or foods.  Certain medicines.  Exposure to certain chemicals, soaps, or cosmetics.  Heat.  Exposure to poisonous plants.  Tumors, both cancerous and noncancerous. SYMPTOMS   Redness.  Scaly skin.  Itchy skin.  Dry or cracked skin.  Bumps.  Blisters.  Pain. DIAGNOSIS  Your caregiver may do a physical exam to determine what type of rash you have. A skin sample (biopsy) may be taken and examined under a microscope. TREATMENT  Treatment depends on the type of rash you have. Your caregiver may prescribe certain medicines. For serious conditions, you may need to see a skin doctor (dermatologist). HOME CARE INSTRUCTIONS   Avoid the substance that caused your rash.  Do not scratch your rash. This can cause infection.  You may take cool baths to help stop itching.  Only take over-the-counter or prescription medicines as directed by your caregiver.  Keep all follow-up appointments as directed by your caregiver. SEEK IMMEDIATE MEDICAL CARE IF:  You have increasing pain, swelling, or redness.  You have a fever.  You have new or severe symptoms.  You have body aches, diarrhea, or vomiting.  Your rash is not better after 3 days. MAKE SURE YOU:  Understand these instructions.  Will watch your condition.  Will get help right away if you are not doing well or get worse.   This information is not intended to replace advice given to you by your health care provider. Make sure you discuss any questions you have with your health care provider.   Document Released: 05/21/2002 Document Revised: 06/21/2014 Document Reviewed: 10/16/2014 Elsevier Interactive Patient Education 2016 [place], which is also known as  herpes zoster, is an infection that causes a painful skin rash and fluid-filled blisters. Shingles is not related to genital herpes, which is a sexually transmitted infection.   Shingles only develops in people who:  Have had chickenpox.  Have received the chickenpox vaccine. (This is rare.) CAUSES Shingles is caused by varicella-zoster virus (VZV). This is the same virus that causes chickenpox. After exposure to VZV, the virus stays in the body in an inactive (dormant) state. Shingles develops if the virus reactivates. This can happen many years after the initial exposure to VZV. It is not known what causes this virus to reactivate. RISK FACTORS People who have had chickenpox or received the chickenpox vaccine are at risk for shingles. Infection is more common in people who:  Are older than age 33.  Have a weakened defense (immune) system, such as those with HIV, AIDS, or cancer.  Are taking medicines that weaken the immune system, such as transplant medicines.  Are under great stress. SYMPTOMS Early symptoms of this condition include itching, tingling, and pain in an area on your skin. Pain may be described as burning, stabbing, or throbbing. A few days or weeks after symptoms start, a painful red rash appears, usually on one side of the body in a bandlike or beltlike pattern. The rash eventually turns into fluid-filled blisters that break open, scab over, and dry up in about 2-3 weeks. At any time during the infection, you may also develop:  A fever.  Chills.  A headache.  An upset stomach. DIAGNOSIS This condition is diagnosed with a skin  exam. Sometimes, skin or fluid samples are taken from the blisters before a diagnosis is made. These samples are examined under a microscope or sent to a lab for testing. TREATMENT There is no specific cure for this condition. Your health care provider will probably prescribe medicines to help you manage pain, recover more quickly, and avoid  long-term problems. Medicines may include:  Antiviral drugs.  Anti-inflammatory drugs.  Pain medicines. If the area involved is on your face, you may be referred to a specialist, such as an eye doctor (ophthalmologist) or an ear, nose, and throat (ENT) doctor to help you avoid eye problems, chronic pain, or disability. HOME CARE INSTRUCTIONS Medicines  Take medicines only as directed by your health care provider.  Apply an anti-itch or numbing cream to the affected area as directed by your health care provider. Blister and Rash Care  Take a cool bath or apply cool compresses to the area of the rash or blisters as directed by your health care provider. This may help with pain and itching.  Keep your rash covered with a loose bandage (dressing). Wear loose-fitting clothing to help ease the pain of material rubbing against the rash.  Keep your rash and blisters clean with mild soap and cool water or as directed by your health care provider.  Check your rash every day for signs of infection. These include redness, swelling, and pain that lasts or increases.  Do not pick your blisters.  Do not scratch your rash. General Instructions  Rest as directed by your health care provider.  Keep all follow-up visits as directed by your health care provider. This is important.  Until your blisters scab over, your infection can cause chickenpox in people who have never had it or been vaccinated against it. To prevent this from happening, avoid contact with other people, especially:  Babies.  Pregnant women.  Children who have eczema.  Elderly people who have transplants.  People who have chronic illnesses, such as leukemia or AIDS. SEEK MEDICAL CARE IF:  Your pain is not relieved with prescribed medicines.  Your pain does not get better after the rash heals.  Your rash looks infected. Signs of infection include redness, swelling, and pain that lasts or increases. SEEK IMMEDIATE  MEDICAL CARE IF:  The rash is on your face or nose.  You have facial pain, pain around your eye area, or loss of feeling on one side of your face.  You have ear pain or you have ringing in your ear.  You have loss of taste.  Your condition gets worse.   This information is not intended to replace advice given to you by your health care provider. Make sure you discuss any questions you have with your health care provider.   Document Released: 05/31/2005 Document Revised: 06/21/2014 Document Reviewed: 04/11/2014 Elsevier Interactive Patient Education [employer].

## 2016-01-13 NOTE — Telephone Encounter (Signed)
Contacted patient, appointment made this afternoon at 3:40 with Ronald Russell

## 2016-01-21 ENCOUNTER — Telehealth: Payer: Self-pay | Admitting: Family

## 2016-01-21 NOTE — Telephone Encounter (Signed)
All treatments for zoster are given for 7 days. There is no advantage to treating longer.

## 2016-01-21 NOTE — Telephone Encounter (Signed)
Patient aware.

## 2016-01-21 NOTE — Telephone Encounter (Signed)
Patient states that he still has shingles but it has improved. Patient has ran out of his Valtrex and would like to know if another RX needs to be called in? [facility] [place]. Covering PCP, please advise and send back to the pools.

## 2016-01-26 ENCOUNTER — Telehealth: Payer: Self-pay | Admitting: Family Medicine

## 2016-01-26 DIAGNOSIS — B029 Zoster without complications: Secondary | ICD-10-CM

## 2016-01-26 NOTE — Telephone Encounter (Signed)
Patient would like to know if he can get a refill on his Valtrex since his shingles are coming back. Patient aware you are out of the office and we will call him back tomorrow. Patient is fine with that. Please advise.

## 2016-01-27 ENCOUNTER — Ambulatory Visit (INDEPENDENT_AMBULATORY_CARE_PROVIDER_SITE_OTHER): Payer: PPO | Admitting: Family Medicine

## 2016-01-27 ENCOUNTER — Encounter: Payer: Self-pay | Admitting: Family Medicine

## 2016-01-27 VITALS — BP 136/80 | HR 80 | Ht 71.0 in | Wt 187.4 lb

## 2016-01-27 DIAGNOSIS — F411 Generalized anxiety disorder: Secondary | ICD-10-CM

## 2016-01-27 DIAGNOSIS — B029 Zoster without complications: Secondary | ICD-10-CM

## 2016-01-27 MED ORDER — VALACYCLOVIR HCL 1 G PO TABS: 1000.0000 mg | ORAL_TABLET | Freq: Three times a day (TID) | ORAL | 0 refills | Status: DC

## 2016-01-27 MED ORDER — LORAZEPAM 0.5 MG PO TABS: 0.2500 mg | ORAL_TABLET | Freq: Three times a day (TID) | ORAL | 1 refills | Status: DC | PRN

## 2016-01-27 MED ORDER — ESCITALOPRAM OXALATE 10 MG PO TABS: 10.0000 mg | ORAL_TABLET | Freq: Every day | ORAL | 5 refills | Status: DC

## 2016-01-27 NOTE — Telephone Encounter (Signed)
Ordered valtrex, unusual course for shingles with severe outbreak. POssibly worsened by high dose steroids.    Laroy Apple, MD [facility] 01/27/2016, 7:34 AM

## 2016-01-27 NOTE — Telephone Encounter (Signed)
Patient aware that Valtrex was sent in to [facility]

## 2016-01-27 NOTE — Progress Notes (Signed)
   HPI  Patient presents today here with anxiety and elevated blood pressure.  Pressures been elevated at home during severe episodes of anxiety elevated to 801 or 655 systolic.  He denies chest pain, dyspnea, or palpitations.  He's had a very stressful home situation recently with his wife causing him extra stress. He also had to put his dog to sleep. He's also been struggling with an atypical course of shingles and has had a small recurrence of red rash over the last few days.    PMH: Smoking status noted ROS: Per HPI  Objective: BP 136/80   Pulse 80   Ht '5\' 11"'$  (1.803 m)   Wt 187 lb 6 oz (85 kg)   BMI 26.13 kg/m  Gen: NAD, alert, cooperative with exam HEENT: NCAT CV: RRR, good S1/S2, no murmur Resp: CTABL, no wheezes, non-labored Ext: No edema, warm Neuro: Alert and oriented, No gross deficits  Psych: Denies suicidal ideation.  Skin Erythematous papules on the right thenar eminence, right upper and lower arm. No areas of fluctuance or induration.   Assessment and plan:  # Generalized anxiety disorder Continue lorazepam, given slightly increase in number per month, also discussed starting Lexapro. Start Lexapro, half tablet 1 week then 1 tablet daily. Follow-up in 3-4 weeks.  # Shingles Atypical course, severe Reached start Valtrex, Rx sent this morning via phone note    Meds ordered this encounter  Medications  . DISCONTD: LORazepam (ATIVAN) 0.5 MG tablet    Sig: Take 0.5-1 tablets (0.25-0.5 mg total) by mouth every 8 (eight) hours as needed for anxiety or sleep.    Dispense:  45 tablet    Refill:  1  . escitalopram (LEXAPRO) 10 MG tablet    Sig: Take 1 tablet (10 mg total) by mouth daily.    Dispense:  30 tablet    Refill:  5  . LORazepam (ATIVAN) 0.5 MG tablet    Sig: Take 0.5-1 tablets (0.25-0.5 mg total) by mouth every 8 (eight) hours as needed for anxiety or sleep.    Dispense:  45 tablet    Refill:  [place], MD [facility] 01/27/2016, 11:44 AM

## 2016-01-27 NOTE — Patient Instructions (Signed)
Great to see you!  I am starting you on a controller medicine for anxiety called lexapro. Start with 1/2 tablet daily for 1 week then increase to 1 pill once daily.   Come back in 3-4 weeks to follow up for anxiety

## 2016-02-02 ENCOUNTER — Telehealth: Payer: Self-pay | Admitting: Family Medicine

## 2016-02-02 DIAGNOSIS — R21 Rash and other nonspecific skin eruption: Secondary | ICD-10-CM

## 2016-02-02 NOTE — Telephone Encounter (Signed)
It is ceartainly an unusual course for shingles but likely due to his high dose steroids throughout the early part of the shingles course.   I would recommend follow up for re-evaluation if it is getting worse.   Laroy Apple, MD [facility] 02/02/2016, 5:18 PM

## 2016-02-03 ENCOUNTER — Telehealth: Payer: Self-pay | Admitting: Family Medicine

## 2016-02-03 DIAGNOSIS — R21 Rash and other nonspecific skin eruption: Secondary | ICD-10-CM | POA: Insufficient documentation

## 2016-02-03 NOTE — Addendum Note (Signed)
Addended by: Timmothy Euler on: 02/03/2016 12:55 PM   Modules accepted: Orders

## 2016-02-03 NOTE — Telephone Encounter (Signed)
Pt aware of referral appt date & time

## 2016-02-03 NOTE — Telephone Encounter (Signed)
Pt aware Dr. [name] sent referral for pt to see Dermatologist

## 2016-02-03 NOTE — Telephone Encounter (Signed)
Usually severe and drawn out shingles will refer to derm.   Laroy Apple, MD [facility] 02/03/2016, 12:52 PM

## 2016-02-04 DIAGNOSIS — B029 Zoster without complications: Secondary | ICD-10-CM | POA: Diagnosis not present

## 2016-02-07 ENCOUNTER — Ambulatory Visit (INDEPENDENT_AMBULATORY_CARE_PROVIDER_SITE_OTHER): Payer: PPO | Admitting: Family Medicine

## 2016-02-07 ENCOUNTER — Encounter: Payer: Self-pay | Admitting: Family Medicine

## 2016-02-07 VITALS — BP 138/75 | HR 68 | Temp 97.4°F | Ht 71.0 in | Wt 191.2 lb

## 2016-02-07 DIAGNOSIS — B029 Zoster without complications: Secondary | ICD-10-CM | POA: Insufficient documentation

## 2016-02-07 MED ORDER — BETAMETHASONE SOD PHOS & ACET 6 (3-3) MG/ML IJ SUSP
6.0000 mg | Freq: Once | INTRAMUSCULAR | Status: AC
  Administered 2016-02-07: 6 mg via INTRAMUSCULAR

## 2016-02-07 MED ORDER — METHYLPREDNISOLONE 8 MG PO TABS: ORAL_TABLET | ORAL | 0 refills | Status: DC

## 2016-02-07 NOTE — Progress Notes (Signed)
Subjective:  Patient ID: Ronald Russell, male    DOB: 12-Oct-1934  Age: 81 y.o. MRN: [id number]  CC: Herpes Zoster (continued rash)   HPI Ronald Russell presents for Follow-up on herpes zoster. He says he's having some soreness and burning sensation in the right thumb. Still has some residual discomfort in the right arm. This radiates up to the proximal bicep region. He feels the gabapentin is helping with that. Symptoms have not completely resolved. [facility] said [facility] needs more [facility]. Pharmacist also mentioned that steroids would be the worst thing he could take for this. Patient's pain/discomfort seems to be 2-4/10. No loss of functionality of the right upper extremity.   History Ronald Russell has a past medical history of Allergy; Anxiety; Bladder cancer ([place]) (2010); Cataract; Chronic bronchitis ([place]); Clotting disorder ([place]); Exertional shortness of breath; GERD (gastroesophageal reflux disease); History of blood transfusion (1949); HOH (hard of hearing); Hypertension; Ocular migraine; Pneumonia; PONV (postoperative nausea and vomiting); Seasonal allergies; and Temporal arteritis ([place]).   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   His family history includes Alzheimer's disease in his father; Cancer in his mother.He reports that he has quit smoking. His smoking use included Cigarettes. He has a 2.25 pack-year smoking history. He has never used smokeless tobacco. He reports that he does not drink alcohol or use drugs.    ROS Review of Systems  Constitutional: Negative for activity change, appetite change, chills, diaphoresis and fever.  HENT: Negative.   Musculoskeletal: Negative for arthralgias and myalgias.  Skin: Positive  for rash.  Neurological: Negative for weakness.  Psychiatric/Behavioral: Negative for agitation.    Objective:  BP 138/75 (BP Location: Left Arm, Patient Position: Sitting, Cuff Size: Large)   Pulse 68   Temp 97.4 F (36.3 C) (Oral)   Ht '5\' 11"'$  (1.803 m)   Wt 191 lb 3.2 oz (86.7 kg)   SpO2 98%   BMI 26.67 kg/m   BP Readings from Last 3 Encounters:  02/07/16 138/75  01/27/16 136/80  01/13/16 138/81    Wt Readings from Last 3 Encounters:  02/07/16 191 lb 3.2 oz (86.7 kg)  01/27/16 187 lb 6 oz (85 kg)  01/13/16 186 lb (84.4 kg)     Physical Exam  Constitutional: He is oriented to person, place, and time. He appears well-developed and well-nourished.  HENT:  Head: Normocephalic and atraumatic.  Right Ear: Tympanic membrane and external ear normal. No decreased hearing is noted.  Left Ear: Tympanic membrane and external ear normal. No decreased hearing is noted.  Mouth/Throat: No oropharyngeal exudate or posterior oropharyngeal erythema.  Eyes: Pupils are equal, round, and reactive to light.  Neck: Normal range of motion. Neck supple.  Cardiovascular: Normal rate and regular rhythm.   No murmur heard. Pulmonary/Chest: Breath sounds normal. No respiratory distress.  Abdominal: Soft. Bowel sounds are normal.  Musculoskeletal: Normal range of motion.  Neurological: He is alert and oriented to person, place, and time.  Skin: Skin is warm and dry.  The right upper extremity has a classic presentation for a C6 dermatomal shingles that is resolving. There is faint erythema and violaceous discoloration from previous plaques measuring 4-8 mm each running across the ventral upper arm over the bicep region. This crosses the elbow into the forearm to  the wrist. There is faint erythema and slight papular residua in the right thumb. There is full range of motion without tenderness for the right upper extremity  Vitals reviewed.   Dg Chest 2 View  Result Date: 12/25/2015 CLINICAL DATA:   Chest wall pain, shortness of breath, cough, dyspnea EXAM: CHEST  2 VIEW COMPARISON:  09/30/2015 FINDINGS: Borderline enlargement of cardiac silhouette. Atherosclerotic calcification aorta. Moderate-sized hiatal hernia. Pulmonary vascularity normal. Lungs clear. No pleural effusion or pneumothorax. Bones demineralized. IMPRESSION: Hiatal hernia. No acute abnormalities. Aortic atherosclerosis. Electronically Signed   By: Lavonia Dana M.D.   On: 12/25/2015 11:12   US Abdomen Limited Ruq  Result Date: 12/25/2015 CLINICAL DATA:  Abdominal pain for 9 days, history of bladder hands EXAM: US ABDOMEN LIMITED - RIGHT UPPER QUADRANT COMPARISON:  CT scan 12/25/2015 FINDINGS: Gallbladder: Gallstones are noted within gallbladder the largest measures 6 mm. No thickening of gallbladder wall. No sonographic Murphy's sign Common bile duct: Diameter: 3.5 mm in diameter within normal limits. Liver: No focal lesion identified. Within normal limits in parenchymal echogenicity. IMPRESSION: 1. Gallstones are noted within gallbladder the largest measures 6 mm. No sonographic Murphy's sign. Normal CBD. Electronically Signed   By: Lahoma Crocker M.D.   On: 12/25/2015 16:24    Assessment & Plan:   Ronald Russell was seen today for herpes zoster.  Diagnoses and all orders for this visit:  Shingles -     betamethasone acetate-betamethasone sodium phosphate (CELESTONE) injection 6 mg; Inject 1 mL (6 mg total) into the muscle once.  Other orders -     methylPREDNISolone (MEDROL) 8 MG tablet; 5 po daily X 2 days, then 4 po daily for 2days. Then 3 p.o daily for 2 days. Then 2 q day X 2days. Then 1 daily for 2 days    I explained to the patient that steroids are in fact appropriate treatment during the convalescent stage of herpes zoster/shingles, although not necessarily useful in the early stages unless linked to the use of antiviral treatment. In this case he has already gone through multiple courses of valacyclovir. Because of this,  steroid should help reduce the residual inflammation of the region. At this time there is no reason to believe that the immunosuppression side effect of the steroid would be contributory.  I have discontinued Mr. Canino promethazine, dicyclomine, valACYclovir, and escitalopram. I have also changed his methylPREDNISolone. Additionally, I am having him maintain his atenolol, omeprazole, acetaminophen, fexofenadine, potassium chloride SA, and LORazepam. We administered betamethasone acetate-betamethasone sodium phosphate.  Meds ordered this encounter  Medications  . betamethasone acetate-betamethasone sodium phosphate (CELESTONE) injection 6 mg  . methylPREDNISolone (MEDROL) 8 MG tablet    Sig: 5 po daily X 2 days, then 4 po daily for 2days. Then 3 p.o daily for 2 days. Then 2 q day X 2days. Then 1 daily for 2 days    Dispense:  30 tablet    Refill:  0     Follow-up: Return if symptoms worsen or fail to improve.  Claretta Fraise, M.D.

## 2016-02-11 ENCOUNTER — Inpatient Hospital Stay: Admission: RE | Admit: 2016-02-11 | Payer: PPO | Source: Ambulatory Visit

## 2016-02-18 DIAGNOSIS — C678 Malignant neoplasm of overlapping sites of bladder: Secondary | ICD-10-CM | POA: Diagnosis not present

## 2016-02-24 ENCOUNTER — Ambulatory Visit
Admission: RE | Admit: 2016-02-24 | Discharge: 2016-02-24 | Disposition: A | Discharge status: Home / Self Care | Payer: PPO | Source: Ambulatory Visit | Attending: Neurology | Admitting: Neurology

## 2016-02-24 DIAGNOSIS — G500 Trigeminal neuralgia: Secondary | ICD-10-CM

## 2016-02-24 DIAGNOSIS — H539 Unspecified visual disturbance: Secondary | ICD-10-CM | POA: Diagnosis not present

## 2016-02-24 MED ORDER — GADOBENATE DIMEGLUMINE 529 MG/ML IV SOLN
17.0000 mL | Freq: Once | INTRAVENOUS | Status: AC | PRN
  Administered 2016-02-24: 17 mL via INTRAVENOUS

## 2016-03-01 ENCOUNTER — Telehealth: Payer: Self-pay | Admitting: *Deleted

## 2016-03-01 NOTE — Telephone Encounter (Signed)
Called and spoke to patient about MRI results per Dr Jaynee Eagles note. Verified his appt on 9/20 check in at 2pm. He verbalized understanding.

## 2016-03-01 NOTE — Telephone Encounter (Signed)
-----   Message from Melvenia Beam, MD sent at 02/27/2016 11:51 AM EDT ----- MRI of the brain was unremarkable no cause for his headaches(good news). thanks

## 2016-03-03 ENCOUNTER — Ambulatory Visit (INDEPENDENT_AMBULATORY_CARE_PROVIDER_SITE_OTHER): Payer: PPO | Admitting: Nurse Practitioner

## 2016-03-03 ENCOUNTER — Encounter: Payer: Self-pay | Admitting: Nurse Practitioner

## 2016-03-03 VITALS — BP 126/75 | HR 66 | Ht 71.0 in | Wt 193.8 lb

## 2016-03-03 DIAGNOSIS — R519 Headache, unspecified: Secondary | ICD-10-CM | POA: Insufficient documentation

## 2016-03-03 DIAGNOSIS — R51 Headache: Secondary | ICD-10-CM | POA: Diagnosis not present

## 2016-03-03 MED ORDER — GABAPENTIN 100 MG PO CAPS: 100.0000 mg | ORAL_CAPSULE | Freq: Every day | ORAL | 11 refills | Status: DC

## 2016-03-03 NOTE — Patient Instructions (Addendum)
Discussed with Dr. Jaynee Eagles  L facial pain much improved  Gabapentin '100mg'$  2 at night for several weeks then increase to 3 if tolerated refilled for 1 year Follow up with Dr. Jaynee Eagles in 6 months
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
## 2016-03-15 ENCOUNTER — Encounter (HOSPITAL_COMMUNITY): Payer: Self-pay | Admitting: Emergency Medicine

## 2016-03-15 ENCOUNTER — Emergency Department (HOSPITAL_COMMUNITY): Payer: No Typology Code available for payment source

## 2016-03-15 ENCOUNTER — Emergency Department (HOSPITAL_COMMUNITY)
Admission: EM | Admit: 2016-03-15 | Discharge: 2016-03-15 | Disposition: A | Discharge status: Home / Self Care | Payer: No Typology Code available for payment source | Attending: Emergency Medicine | Admitting: Emergency Medicine

## 2016-03-15 DIAGNOSIS — Z8559 Personal history of malignant neoplasm of other urinary tract organ: Secondary | ICD-10-CM | POA: Insufficient documentation

## 2016-03-15 DIAGNOSIS — Y999 Unspecified external cause status: Secondary | ICD-10-CM | POA: Diagnosis not present

## 2016-03-15 DIAGNOSIS — S0990XA Unspecified injury of head, initial encounter: Secondary | ICD-10-CM | POA: Insufficient documentation

## 2016-03-15 DIAGNOSIS — I10 Essential (primary) hypertension: Secondary | ICD-10-CM | POA: Insufficient documentation

## 2016-03-15 DIAGNOSIS — Z87891 Personal history of nicotine dependence: Secondary | ICD-10-CM | POA: Insufficient documentation

## 2016-03-15 DIAGNOSIS — M542 Cervicalgia: Secondary | ICD-10-CM | POA: Diagnosis not present

## 2016-03-15 DIAGNOSIS — R51 Headache: Secondary | ICD-10-CM | POA: Diagnosis not present

## 2016-03-15 DIAGNOSIS — S098XXA Other specified injuries of head, initial encounter: Secondary | ICD-10-CM | POA: Diagnosis not present

## 2016-03-15 DIAGNOSIS — G4489 Other headache syndrome: Secondary | ICD-10-CM | POA: Diagnosis not present

## 2016-03-15 DIAGNOSIS — Y92410 Unspecified street and highway as the place of occurrence of the external cause: Secondary | ICD-10-CM | POA: Diagnosis not present

## 2016-03-15 DIAGNOSIS — Y939 Activity, unspecified: Secondary | ICD-10-CM | POA: Insufficient documentation

## 2016-03-15 DIAGNOSIS — S199XXA Unspecified injury of neck, initial encounter: Secondary | ICD-10-CM | POA: Diagnosis not present

## 2016-03-15 MED ORDER — ACETAMINOPHEN 325 MG PO TABS
650.0000 mg | ORAL_TABLET | Freq: Once | ORAL | Status: AC
  Administered 2016-03-15: 650 mg via ORAL
  Filled 2016-03-15: qty 2

## 2016-03-15 NOTE — ED Notes (Signed)
Pt to CT at this time.  Also given [place] sandwich
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
## 2016-03-17 ENCOUNTER — Encounter: Payer: Self-pay | Admitting: Pediatrics

## 2016-03-17 ENCOUNTER — Ambulatory Visit (INDEPENDENT_AMBULATORY_CARE_PROVIDER_SITE_OTHER): Payer: PPO | Admitting: Pediatrics

## 2016-03-17 ENCOUNTER — Ambulatory Visit: Payer: PPO

## 2016-03-17 VITALS — BP 123/68 | HR 75 | Temp 98.3°F | Ht 70.0 in | Wt 193.2 lb

## 2016-03-17 DIAGNOSIS — S060X9A Concussion with loss of consciousness of unspecified duration, initial encounter: Secondary | ICD-10-CM

## 2016-03-17 DIAGNOSIS — M542 Cervicalgia: Secondary | ICD-10-CM

## 2016-03-17 DIAGNOSIS — E559 Vitamin D deficiency, unspecified: Secondary | ICD-10-CM | POA: Diagnosis not present

## 2016-03-17 DIAGNOSIS — I10 Essential (primary) hypertension: Secondary | ICD-10-CM | POA: Diagnosis not present

## 2016-03-17 DIAGNOSIS — S060X9D Concussion with loss of consciousness of unspecified duration, subsequent encounter: Secondary | ICD-10-CM

## 2016-03-17 NOTE — Patient Instructions (Signed)
Gentle neck stretching range of motion exercises several times every day Avoid watching tv, using computer screens, exercise, anything that makes your headaches worse.
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
## 2016-03-31 ENCOUNTER — Other Ambulatory Visit: Payer: Self-pay | Admitting: Family Medicine

## 2016-04-06 ENCOUNTER — Encounter: Payer: Self-pay | Admitting: Pediatrics

## 2016-04-06 ENCOUNTER — Ambulatory Visit (INDEPENDENT_AMBULATORY_CARE_PROVIDER_SITE_OTHER): Payer: PPO | Admitting: Pediatrics

## 2016-04-06 VITALS — BP 143/76 | HR 68 | Temp 96.9°F | Ht 70.0 in | Wt 194.6 lb

## 2016-04-06 DIAGNOSIS — S134XXD Sprain of ligaments of cervical spine, subsequent encounter: Secondary | ICD-10-CM

## 2016-04-06 DIAGNOSIS — G44309 Post-traumatic headache, unspecified, not intractable: Secondary | ICD-10-CM

## 2016-04-06 DIAGNOSIS — M109 Gout, unspecified: Secondary | ICD-10-CM

## 2016-04-06 DIAGNOSIS — B029 Zoster without complications: Secondary | ICD-10-CM

## 2016-04-06 DIAGNOSIS — Z23 Encounter for immunization: Secondary | ICD-10-CM

## 2016-04-06 MED ORDER — PREDNISONE 10 MG PO TABS: 10.0000 mg | ORAL_TABLET | Freq: Every day | ORAL | 0 refills | Status: DC
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
## 2016-04-07 ENCOUNTER — Telehealth: Payer: Self-pay | Admitting: Pediatrics

## 2016-04-07 NOTE — Telephone Encounter (Signed)
TC to [facility]. Clarified Prednisone is to be a taper dose.

## 2016-05-03 DIAGNOSIS — M109 Gout, unspecified: Secondary | ICD-10-CM | POA: Diagnosis not present

## 2016-05-04 ENCOUNTER — Ambulatory Visit: Payer: PPO | Admitting: Pediatrics

## 2016-05-05 ENCOUNTER — Encounter: Payer: Self-pay | Admitting: Family Medicine

## 2016-05-20 ENCOUNTER — Ambulatory Visit (INDEPENDENT_AMBULATORY_CARE_PROVIDER_SITE_OTHER): Payer: PPO | Admitting: Pediatrics

## 2016-05-20 ENCOUNTER — Encounter: Payer: Self-pay | Admitting: Pediatrics

## 2016-05-20 VITALS — BP 135/67 | HR 62 | Temp 98.6°F | Ht 70.0 in | Wt 198.0 lb

## 2016-05-20 DIAGNOSIS — F419 Anxiety disorder, unspecified: Secondary | ICD-10-CM | POA: Diagnosis not present

## 2016-05-20 DIAGNOSIS — I10 Essential (primary) hypertension: Secondary | ICD-10-CM

## 2016-05-20 DIAGNOSIS — B0229 Other postherpetic nervous system involvement: Secondary | ICD-10-CM | POA: Diagnosis not present

## 2016-05-20 DIAGNOSIS — M109 Gout, unspecified: Secondary | ICD-10-CM

## 2016-05-20 DIAGNOSIS — G4700 Insomnia, unspecified: Secondary | ICD-10-CM | POA: Diagnosis not present

## 2016-05-20 DIAGNOSIS — E559 Vitamin D deficiency, unspecified: Secondary | ICD-10-CM | POA: Diagnosis not present

## 2016-05-20 MED ORDER — CITALOPRAM HYDROBROMIDE 10 MG PO TABS: 10.0000 mg | ORAL_TABLET | Freq: Every day | ORAL | 2 refills | Status: DC

## 2016-05-20 MED ORDER — COLCHICINE 0.6 MG PO TABS: 0.6000 mg | ORAL_TABLET | Freq: Every day | ORAL | 2 refills | Status: DC

## 2016-05-20 NOTE — Progress Notes (Signed)
Subjective:   Patient ID: Ronald Russell, male    DOB: [birth year], 81 y.o.   MRN: [id number] CC: Big toe swollen and BP dropping at times  HPI: Ronald Russell is a 81 y.o. male presenting for Big toe swollen and BP dropping at times  Insomnia and anxiety: Takes gabapentin, lorazepam, melatonin at night When taking all three feels very sleepy the next morning Usually takes half a ativan Cant sleep because of racing thoughts and anxiety Sometimes gets anxious or irritated during the day "because of children or women" in his life and his BP shoots up someitmes he takes a half a ativan during the day when he is acutely worried or anxious Doesn't remember being on SSRI or similar daily anti-anxiety pill  Gout: Was started on indomethecin for gout a couple weeks ago Helped a lot Pain mostly gone Still feels some "tightness" in R forefoot where gout has been before  Post-herpetic neuralgia:  Improving  No new rash R thum still bothering him, taking gabapentin helps He hopes to stop gabapentin because does make him sleepy  GER: takes omeprazole daily, has symptoms if he misses it  Pre-syncope: daily has episdoes of feeling lightheaded when he stands up too fast Says BP ranges at home from 160s when he is upset to low 100s when he is at rest Has not fallen because of this Been diagnosed with orthostatic hypotension in the past  Relevant past medical, surgical, family and social history reviewed. Allergies and medications reviewed and updated. History  Smoking Status  . Former Smoker  . Packs/day: 0.75  . Years: 3.00  . Types: Cigarettes  Smokeless Tobacco  . Never Used    Comment: 02/13/2013 "quit smoking age 33"   ROS: Per HPI   Objective:    BP 135/67   Pulse 62   Temp 98.6 F (37 C) (Oral)   Ht '5\' 10"'$  (1.778 m)   Wt 198 lb (89.8 kg)   BMI 28.41 kg/m   Wt Readings from Last 3 Encounters:  05/20/16 198 lb (89.8 kg)  04/06/16 194 lb 9.6 oz (88.3 kg)  03/17/16 193 lb  3.2 oz (87.6 kg)    Gen: NAD, alert, cooperative with exam, NCAT EYES: EOMI, no conjunctival injection, or no icterus CV: NRRR, normal S1/S2, no murmur, DP pulses 2+ b/l Resp: CTABL, no wheezes, normal WOB Abd: +BS, soft, NTND. no guarding or organomegaly Ext: No edema, warm Neuro: Alert and oriented, strength equal b/l UE and LE, coordination grossly normal MSK: R great toe normal appearing, no redness, no swelling Psych: normal affect  Assessment & Plan:  Delance was seen today for big toe swollen and bp dropping at times.  Diagnoses and all orders for this visit:  Anxiety Taking lorazepam 0.'25mg'$ -0.'5mg'$  nightly Sometimes during the day as well Start below, try to minimize ativan use Just refilled Rx for ativan, pt in agreement to try to decrease and stop ativan Wife no longer living at home with him, says he lvoes her but was greatly increasing his stress levels and feels calmer now -     citalopram (CELEXA) 10 MG tablet; Take 1 tablet (10 mg total) by mouth daily.  Essential hypertension Stop atenolol SBP 104 when standing for orthostatics Cont to check at home when at rest and calm Let me know if elevated -     BMP8+EGFR -     Lipid panel  Gout, unspecified cause, unspecified chronicity, unspecified site No active gout today Check uric acid  level Cont colchicine Cont to avoid foods that cause gout indomethecin prn -     Uric acid  Insomnia, unspecified type Avoid ativan at night Cont melatonin Also taking gabapenitn at night as below, has symptoms when he doesn't take it Hoping to wean himself off as symptoms improving  Post herpetic neuralgia Cont gabapentin at night, symptoms improving  Vitamin D deficiency -     VITAMIN D 25 Hydroxy (Vit-D Deficiency, Fractures)  Follow up plan: 8 weeks Assunta Found, MD [place]

## 2016-05-21 LAB — LIPID PANEL
Chol/HDL Ratio: 4.8 ratio units (ref 0.0–5.0)
Cholesterol, Total: 167 mg/dL (ref 100–199)
HDL: 35 mg/dL — ABNORMAL LOW (ref >39)
LDL Calculated: 79 mg/dL (ref 0–99)
Triglycerides: 265 mg/dL — ABNORMAL HIGH (ref 0–149)
VLDL Cholesterol Cal: 53 mg/dL — ABNORMAL HIGH (ref 5–40)

## 2016-05-21 LAB — URIC ACID: Uric Acid: 6.9 mg/dL (ref 3.7–8.6)

## 2016-05-21 LAB — BMP8+EGFR
BUN/Creatinine Ratio: 12 (ref 10–24)
BUN: 14 mg/dL (ref 8–27)
CO2: 27 mmol/L (ref 18–29)
Calcium: 9 mg/dL (ref 8.6–10.2)
Chloride: 102 mmol/L (ref 96–106)
Creatinine, Ser: 1.15 mg/dL (ref 0.76–1.27)
GFR calc Af Amer: 70 mL/min/{1.73_m2} (ref >59)
GFR calc non Af Amer: 60 mL/min/{1.73_m2} (ref >59)
Glucose: 87 mg/dL (ref 65–99)
Potassium: 3.8 mmol/L (ref 3.5–5.2)
Sodium: 143 mmol/L (ref 134–144)

## 2016-05-21 LAB — VITAMIN D 25 HYDROXY (VIT D DEFICIENCY, FRACTURES): Vit D, 25-Hydroxy: 23.5 ng/mL — ABNORMAL LOW (ref 30.0–100.0)

## 2016-05-26 ENCOUNTER — Telehealth: Payer: Self-pay | Admitting: Pediatrics

## 2016-05-26 NOTE — Telephone Encounter (Signed)
Tried several times to reach pt, have a result note in and sent something through My Chart, a couple medicaitons I want him to start if he is OK with it

## 2016-05-26 NOTE — Telephone Encounter (Signed)
Patient is requesting lab results from 05/20/16 please review and advise.

## 2016-05-27 MED ORDER — ALLOPURINOL 100 MG PO TABS: 100.0000 mg | ORAL_TABLET | Freq: Every day | ORAL | 1 refills | Status: DC

## 2016-05-27 MED ORDER — PRAVASTATIN SODIUM 40 MG PO TABS: 40.0000 mg | ORAL_TABLET | Freq: Every day | ORAL | 1 refills | Status: DC

## 2016-05-27 NOTE — Telephone Encounter (Signed)
Pt aware of results and the new medications and rx's were sent to pharmacy.

## 2016-05-31 ENCOUNTER — Encounter: Payer: Self-pay | Admitting: Family Medicine

## 2016-05-31 ENCOUNTER — Ambulatory Visit (INDEPENDENT_AMBULATORY_CARE_PROVIDER_SITE_OTHER): Payer: PPO | Admitting: Family Medicine

## 2016-05-31 VITALS — BP 137/85 | HR 69 | Temp 97.0°F | Ht 70.0 in | Wt 196.0 lb

## 2016-05-31 DIAGNOSIS — R55 Syncope and collapse: Secondary | ICD-10-CM | POA: Diagnosis not present

## 2016-05-31 DIAGNOSIS — R002 Palpitations: Secondary | ICD-10-CM

## 2016-05-31 MED ORDER — DILTIAZEM HCL ER COATED BEADS 180 MG PO CP24: 180.0000 mg | ORAL_CAPSULE | Freq: Every day | ORAL | 1 refills | Status: DC

## 2016-05-31 NOTE — Addendum Note (Signed)
Addended by: Claretta Fraise on: 05/31/2016 05:55 PM   Modules accepted: Orders
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
## 2016-06-02 ENCOUNTER — Emergency Department (HOSPITAL_COMMUNITY)
Admission: EM | Admit: 2016-06-02 | Discharge: 2016-06-02 | Disposition: A | Discharge status: Home / Self Care | Payer: PPO | Attending: Emergency Medicine | Admitting: Emergency Medicine

## 2016-06-02 ENCOUNTER — Telehealth: Payer: Self-pay | Admitting: Family Medicine

## 2016-06-02 ENCOUNTER — Encounter (HOSPITAL_COMMUNITY): Payer: Self-pay | Admitting: Emergency Medicine

## 2016-06-02 DIAGNOSIS — I10 Essential (primary) hypertension: Secondary | ICD-10-CM | POA: Insufficient documentation

## 2016-06-02 DIAGNOSIS — Z79899 Other long term (current) drug therapy: Secondary | ICD-10-CM | POA: Diagnosis not present

## 2016-06-02 DIAGNOSIS — Z87891 Personal history of nicotine dependence: Secondary | ICD-10-CM | POA: Diagnosis not present

## 2016-06-02 DIAGNOSIS — R030 Elevated blood-pressure reading, without diagnosis of hypertension: Secondary | ICD-10-CM | POA: Diagnosis not present

## 2016-06-02 DIAGNOSIS — R42 Dizziness and giddiness: Secondary | ICD-10-CM | POA: Insufficient documentation

## 2016-06-02 DIAGNOSIS — Z7982 Long term (current) use of aspirin: Secondary | ICD-10-CM | POA: Diagnosis not present

## 2016-06-02 DIAGNOSIS — Z8551 Personal history of malignant neoplasm of bladder: Secondary | ICD-10-CM | POA: Diagnosis not present

## 2016-06-02 LAB — CBC WITH DIFFERENTIAL/PLATELET
Basophils Absolute: 0 10*3/uL (ref 0.0–0.1)
Basophils Relative: 1 %
Eosinophils Absolute: 0.2 10*3/uL (ref 0.0–0.7)
Eosinophils Relative: 3 %
HCT: 42.7 % (ref 39.0–52.0)
Hemoglobin: 15.3 g/dL (ref 13.0–17.0)
Lymphocytes Relative: 18 %
Lymphs Abs: 1.3 10*3/uL (ref 0.7–4.0)
MCH: 32.2 pg (ref 26.0–34.0)
MCHC: 35.8 g/dL (ref 30.0–36.0)
MCV: 89.9 fL (ref 78.0–100.0)
Monocytes Absolute: 0.8 10*3/uL (ref 0.1–1.0)
Monocytes Relative: 11 %
Neutro Abs: 4.9 10*3/uL (ref 1.7–7.7)
Neutrophils Relative %: 67 %
Platelets: 122 10*3/uL — ABNORMAL LOW (ref 150–400)
RBC: 4.75 MIL/uL (ref 4.22–5.81)
RDW: 13.5 % (ref 11.5–15.5)
WBC: 7.3 10*3/uL (ref 4.0–10.5)

## 2016-06-02 LAB — BASIC METABOLIC PANEL
Anion gap: 8 (ref 5–15)
BUN: 16 mg/dL (ref 6–20)
CO2: 27 mmol/L (ref 22–32)
Calcium: 9 mg/dL (ref 8.9–10.3)
Chloride: 104 mmol/L (ref 101–111)
Creatinine, Ser: 1.15 mg/dL (ref 0.61–1.24)
GFR calc Af Amer: >60 mL/min (ref >60)
GFR calc non Af Amer: 59 mL/min — ABNORMAL LOW (ref >60)
Glucose, Bld: 102 mg/dL — ABNORMAL HIGH (ref 65–99)
Potassium: 3.6 mmol/L (ref 3.5–5.1)
Sodium: 139 mmol/L (ref 135–145)

## 2016-06-02 LAB — I-STAT TROPONIN, ED: Troponin i, poc: 0 ng/mL (ref 0.00–0.08)

## 2016-06-02 NOTE — ED Triage Notes (Signed)
Patient states that he began taking cardizem Monday night.  States that he began feeling bad last night and also took allopurinol around 1600 last night.  States that "my BP has been high ever since" but states that BP previous to medication change was around 180/100.  States that he believes the gout medication is causing his hypertension.

## 2016-06-02 NOTE — Discharge Instructions (Signed)
Stop taking the allopurinol until you see your doctor.  Please follow-up with your doctor as soon as possible.  Return to the ER if you have worsening symptoms.

## 2016-06-02 NOTE — ED Triage Notes (Signed)
Per EMS patient switched from atenolol to cardizem on Monday.  States today when he stands up he feels like BP is becoming higher.  States he feels lightheaded when standing up.  CBG 101.  C/o nausea due to not eating today. SBP between 130-150 and DBP around 90
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
## 2016-06-02 NOTE — ED Notes (Signed)
Bed: WA06 Expected date:  Expected time:  Means of arrival:  Comments: EMS- 24s M, HTN

## 2016-06-02 NOTE — Telephone Encounter (Signed)
Spoke with pt regarding sxs Pt states upon standing he becomes light-headed to the point of syncope and his heart feels like it is "beating out of my chest" Pt instructed to go to the ED or call 911

## 2016-06-04 ENCOUNTER — Encounter: Payer: Self-pay | Admitting: Family Medicine

## 2016-06-04 ENCOUNTER — Ambulatory Visit (INDEPENDENT_AMBULATORY_CARE_PROVIDER_SITE_OTHER): Payer: PPO | Admitting: Family Medicine

## 2016-06-04 VITALS — BP 164/89 | HR 83 | Ht 71.0 in | Wt 196.2 lb

## 2016-06-04 DIAGNOSIS — I10 Essential (primary) hypertension: Secondary | ICD-10-CM

## 2016-06-04 DIAGNOSIS — F411 Generalized anxiety disorder: Secondary | ICD-10-CM | POA: Diagnosis not present

## 2016-06-04 MED ORDER — DILTIAZEM HCL ER COATED BEADS 180 MG PO CP24: 180.0000 mg | ORAL_CAPSULE | Freq: Two times a day (BID) | ORAL | 2 refills | Status: DC

## 2016-06-04 NOTE — Progress Notes (Signed)
Subjective:  Patient ID: Ronald Russell, male    DOB: [birth year]  Age: 81 y.o. MRN: [id number]  CC: Hypertension   HPI ZACARIAH BELUE presents for Blood pressure still running too high. He feels like his face is burning on the inside. His forehead is red. Blood pressure much better since stopping the atenolol and the Zyloprim. He wants to go off all of his medicines that he can, however she feels the lorazepam is very helpful when he takes it. He felt that the Celexa was ineffective and actually made him worse.   History Wesly has a past medical history of Allergy; Anxiety; Bladder cancer ([place]) (2010); Cataract; Chronic bronchitis ([place]); Clotting disorder ([place]); Exertional shortness of breath; GERD (gastroesophageal reflux disease); History of blood transfusion (1949); HOH (hard of hearing); Hypertension; Ocular migraine; Pneumonia; PONV (postoperative nausea and vomiting); Seasonal allergies; Shingles; and Temporal arteritis ([place]).   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   His family history includes Alzheimer's disease in his father; Cancer in his mother.He reports that he has quit smoking. His smoking use included Cigarettes. He has a 2.25 pack-year smoking history. He has never used smokeless tobacco. He reports that he does not drink alcohol or use drugs.    ROS Review of Systems  Constitutional: Negative for chills, diaphoresis, fever and unexpected weight change.  HENT: Negative for congestion, hearing loss, rhinorrhea and sore throat.   Eyes: Negative for visual disturbance.  Respiratory: Negative for cough and shortness of breath.   Cardiovascular: Negative for chest pain.  Gastrointestinal: Negative for abdominal pain, constipation and  diarrhea.  Genitourinary: Negative for dysuria and flank pain.  Musculoskeletal: Negative for arthralgias and joint swelling.  Skin: Negative for rash.  Neurological: Negative for dizziness and headaches.  Psychiatric/Behavioral: Negative for dysphoric mood and sleep disturbance.    Objective:  BP (!) 164/89   Pulse 83   Ht '5\' 11"'$  (1.803 m)   Wt 196 lb 4 oz (89 kg)   BMI 27.37 kg/m   BP Readings from Last 3 Encounters:  06/04/16 (!) 164/89  06/02/16 140/87  05/31/16 137/85    Wt Readings from Last 3 Encounters:  06/04/16 196 lb 4 oz (89 kg)  06/02/16 190 lb (86.2 kg)  05/31/16 196 lb (88.9 kg)     Physical Exam  Constitutional: He is oriented to person, place, and time. He appears well-developed and well-nourished. No distress.  HENT:  Head: Normocephalic and atraumatic.  Right Ear: External ear normal.  Left Ear: External ear normal.  Nose: Nose normal.  Mouth/Throat: Oropharynx is clear and moist.  Eyes: Conjunctivae and EOM are normal. Pupils are equal, round, and reactive to light.  Neck: Normal range of motion. Neck supple. No thyromegaly present.  Cardiovascular: Normal rate, regular rhythm and normal heart sounds.   No murmur heard. Pulmonary/Chest: Effort normal and breath sounds normal. No respiratory distress. He has no wheezes. He has no rales.  Abdominal: Soft. Bowel sounds are normal. He exhibits no distension. There is no tenderness.  Lymphadenopathy:    He has no cervical adenopathy.  Neurological: He is alert and oriented to person, place, and time. He has normal reflexes.  Skin: Skin is warm and dry.  Psychiatric: He has a normal mood and affect. His behavior is normal. Judgment and thought content  normal.     Lab Results  Component Value Date   WBC 7.3 06/02/2016   HGB 15.3 06/02/2016   HCT 42.7 06/02/2016   PLT 122 (L) 06/02/2016   GLUCOSE 102 (H) 06/02/2016   CHOL 167 05/20/2016   TRIG 265 (H) 05/20/2016   HDL 35 (L) 05/20/2016    LDLCALC 79 05/20/2016   ALT 14 01/06/2016   AST 16 01/06/2016   NA 139 06/02/2016   K 3.6 06/02/2016   CL 104 06/02/2016   CREATININE 1.15 06/02/2016   BUN 16 06/02/2016   CO2 27 06/02/2016   TSH 4.093 02/13/2013   INR 0.91 07/31/2011   HGBA1C 5.2 02/13/2013    No results found.  Assessment & Plan:   Primo was seen today for hypertension.  Diagnoses and all orders for this visit:  Essential hypertension  GAD (generalized anxiety disorder)  Other orders -     diltiazem (CARTIA XT) 180 MG 24 hr capsule; Take 1 capsule (180 mg total) by mouth 2 (two) times daily.    Allergies as of 06/04/2016      Reactions   Codeine Other (See Comments)   Severe headache, nausea and vomiting   Oxycodone Nausea And Vomiting   Prednisone Other (See Comments)   hyperactivity   Vicodin [hydrocodone-acetaminophen] Nausea And Vomiting   Ciprofloxacin Nausea Only      Medication List       Accurate as of 06/04/16 11:59 PM. Always use your most recent med list.          aspirin EC 81 MG tablet Take 81 mg by mouth daily.   diltiazem 180 MG 24 hr capsule Commonly known as:  CARTIA XT Take 1 capsule (180 mg total) by mouth 2 (two) times daily.   LORazepam 0.5 MG tablet Commonly known as:  ATIVAN Take 0.5-1 tablets (0.25-0.5 mg total) by mouth every 8 (eight) hours as needed for anxiety or sleep.   omeprazole 40 MG capsule Commonly known as:  PRILOSEC Take 1 capsule (40 mg total) by mouth 2 (two) times daily.        I have discontinued Mr. Gulyas fexofenadine, gabapentin, citalopram, colchicine, pravastatin, allopurinol, acetaminophen, and (VITAMIN D, CHOLECALCIFEROL, PO). I have also changed his diltiazem. Additionally, I am having him maintain his omeprazole and aspirin EC.  Meds ordered this encounter  Medications  . DISCONTD: VITAMIN D, CHOLECALCIFEROL, PO    Sig: Take by mouth.  . diltiazem (CARTIA XT) 180 MG 24 hr capsule    Sig: Take 1 capsule (180 mg total) by  mouth 2 (two) times daily.    Dispense:  60 capsule    Refill:  2     Follow-up: Return in about 2 weeks (around 06/18/2016).  Claretta Fraise, M.D.

## 2016-06-14 ENCOUNTER — Other Ambulatory Visit: Payer: Self-pay | Admitting: Family Medicine

## 2016-06-15 NOTE — Telephone Encounter (Signed)
rx called into pharmacy

## 2016-07-06 DIAGNOSIS — Z800 Family history of malignant neoplasm of digestive organs: Secondary | ICD-10-CM | POA: Diagnosis not present

## 2016-07-06 DIAGNOSIS — Z8719 Personal history of other diseases of the digestive system: Secondary | ICD-10-CM | POA: Diagnosis not present

## 2016-07-06 DIAGNOSIS — Z86010 Personal history of colonic polyps: Secondary | ICD-10-CM | POA: Diagnosis not present

## 2016-07-15 ENCOUNTER — Telehealth: Payer: Self-pay | Admitting: Family Medicine

## 2016-07-16 ENCOUNTER — Encounter (HOSPITAL_COMMUNITY): Payer: Self-pay | Admitting: Nurse Practitioner

## 2016-07-16 ENCOUNTER — Emergency Department (HOSPITAL_COMMUNITY)
Admission: EM | Admit: 2016-07-16 | Discharge: 2016-07-16 | Disposition: A | Discharge status: Home / Self Care | Payer: PPO | Attending: Emergency Medicine | Admitting: Emergency Medicine

## 2016-07-16 ENCOUNTER — Ambulatory Visit: Payer: PPO | Admitting: Family Medicine

## 2016-07-16 DIAGNOSIS — Z79899 Other long term (current) drug therapy: Secondary | ICD-10-CM | POA: Insufficient documentation

## 2016-07-16 DIAGNOSIS — R404 Transient alteration of awareness: Secondary | ICD-10-CM | POA: Diagnosis not present

## 2016-07-16 DIAGNOSIS — R002 Palpitations: Secondary | ICD-10-CM | POA: Diagnosis not present

## 2016-07-16 DIAGNOSIS — Z87891 Personal history of nicotine dependence: Secondary | ICD-10-CM | POA: Diagnosis not present

## 2016-07-16 DIAGNOSIS — Z8551 Personal history of malignant neoplasm of bladder: Secondary | ICD-10-CM | POA: Insufficient documentation

## 2016-07-16 DIAGNOSIS — Z7982 Long term (current) use of aspirin: Secondary | ICD-10-CM | POA: Insufficient documentation

## 2016-07-16 DIAGNOSIS — I10 Essential (primary) hypertension: Secondary | ICD-10-CM | POA: Diagnosis not present

## 2016-07-16 DIAGNOSIS — R531 Weakness: Secondary | ICD-10-CM | POA: Diagnosis not present

## 2016-07-16 DIAGNOSIS — R42 Dizziness and giddiness: Secondary | ICD-10-CM | POA: Diagnosis not present

## 2016-07-16 LAB — CBC WITH DIFFERENTIAL/PLATELET
Basophils Absolute: 0 10*3/uL (ref 0.0–0.1)
Basophils Relative: 1 %
Eosinophils Absolute: 0.3 10*3/uL (ref 0.0–0.7)
Eosinophils Relative: 3 %
HCT: 44.3 % (ref 39.0–52.0)
Hemoglobin: 15.6 g/dL (ref 13.0–17.0)
Lymphocytes Relative: 19 %
Lymphs Abs: 1.5 10*3/uL (ref 0.7–4.0)
MCH: 32.4 pg (ref 26.0–34.0)
MCHC: 35.2 g/dL (ref 30.0–36.0)
MCV: 91.9 fL (ref 78.0–100.0)
Monocytes Absolute: 0.7 10*3/uL (ref 0.1–1.0)
Monocytes Relative: 9 %
Neutro Abs: 5.3 10*3/uL (ref 1.7–7.7)
Neutrophils Relative %: 68 %
Platelets: 129 10*3/uL — ABNORMAL LOW (ref 150–400)
RBC: 4.82 MIL/uL (ref 4.22–5.81)
RDW: 14.1 % (ref 11.5–15.5)
WBC: 7.9 10*3/uL (ref 4.0–10.5)

## 2016-07-16 LAB — BASIC METABOLIC PANEL
Anion gap: 9 (ref 5–15)
BUN: 11 mg/dL (ref 6–20)
CO2: 26 mmol/L (ref 22–32)
Calcium: 8.9 mg/dL (ref 8.9–10.3)
Chloride: 105 mmol/L (ref 101–111)
Creatinine, Ser: 1.12 mg/dL (ref 0.61–1.24)
GFR calc Af Amer: >60 mL/min (ref >60)
GFR calc non Af Amer: >60 mL/min (ref >60)
Glucose, Bld: 104 mg/dL — ABNORMAL HIGH (ref 65–99)
Potassium: 3.7 mmol/L (ref 3.5–5.1)
Sodium: 140 mmol/L (ref 135–145)

## 2016-07-16 LAB — I-STAT TROPONIN, ED: Troponin i, poc: 0 ng/mL (ref 0.00–0.08)

## 2016-07-16 MED ORDER — SODIUM CHLORIDE 0.9 % IV BOLUS (SEPSIS)
1000.0000 mL | Freq: Once | INTRAVENOUS | Status: AC
  Administered 2016-07-16: 1000 mL via INTRAVENOUS
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
## 2016-07-16 NOTE — Discharge Instructions (Signed)
Read the information below.  You may return to the Emergency Department at any time for worsening condition or any new symptoms that concern you.   If you develop chest pain, shortness of breath, fever, you pass out, or become weak or dizzy, return to the ER for a recheck.

## 2016-07-16 NOTE — ED Triage Notes (Signed)
Pt states since yesterday around noon, he has been having heart palpitation and fluttering that he attributes to elevated BP and low potassium. Adds that earlier today he almost had a fall after feeling "swimmy headed." Denies chest pain or associated shortness of breath.

## 2016-07-16 NOTE — ED Notes (Signed)
Bed: WA03 Expected date:  Expected time:  Means of arrival:  Comments: Ems-HTN

## 2016-07-20 ENCOUNTER — Ambulatory Visit: Payer: PPO | Admitting: Family Medicine

## 2016-07-23 ENCOUNTER — Ambulatory Visit (INDEPENDENT_AMBULATORY_CARE_PROVIDER_SITE_OTHER): Payer: PPO | Admitting: Family Medicine

## 2016-07-23 ENCOUNTER — Encounter: Payer: Self-pay | Admitting: Family Medicine

## 2016-07-23 VITALS — BP 135/82 | HR 78 | Temp 98.7°F | Ht 71.0 in | Wt 198.0 lb

## 2016-07-23 DIAGNOSIS — M10079 Idiopathic gout, unspecified ankle and foot: Secondary | ICD-10-CM | POA: Diagnosis not present

## 2016-07-23 DIAGNOSIS — R002 Palpitations: Secondary | ICD-10-CM | POA: Diagnosis not present

## 2016-07-23 DIAGNOSIS — I10 Essential (primary) hypertension: Secondary | ICD-10-CM

## 2016-07-23 MED ORDER — COLCHICINE 0.6 MG PO TABS: 0.6000 mg | ORAL_TABLET | Freq: Two times a day (BID) | ORAL | 5 refills | Status: DC

## 2016-07-23 NOTE — Progress Notes (Signed)
Subjective:  Patient ID: Ronald Russell, male    DOB: 1934-07-16  Age: 82 y.o. MRN: [id number]  CC: Gout (pt here today c/o gout in both feet)   HPI SHAMERE CAMPAS presents for Patient having continued gout pain at the first MTP joint bilaterally. He's been taking his colchicine and the right is getting better. The left seems to have started more recently and is increasing in pain. He was given allopurinol in the past and is currently taking that. Due to his elevation of his blood pressure he has not been taking indomethacin for his gout. Pain is moderately severe. He is concerned that he is having some loose bowel movements. He is taking a probiotic. He says he had 3 bowel movements in the last 24 hours. They are somewhat loose.  One week ago he went to the emergency department due to palpitations. He was ruled out for MI and told to follow up with cardiology. He is not currently having the palpitations. However they do come and go. And/or similar to symptoms for which she's been treated in the recent past. Emergency department record was reviewed as part of this office evaluation. Cardiology referral will be made.   History Che has a past medical history of Allergy; Anxiety; Bladder cancer ([place]) (2010); Cataract; Chronic bronchitis ([place]); Clotting disorder ([place]); Exertional shortness of breath; GERD (gastroesophageal reflux disease); History of blood transfusion (1949); HOH (hard of hearing); Hypertension; Ocular migraine; Pneumonia; PONV (postoperative nausea and vomiting); Seasonal allergies; Shingles; and Temporal arteritis ([place]).   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   His family history includes Alzheimer's disease in his father; Cancer in his  mother.He reports that he has quit smoking. His smoking use included Cigarettes. He has a 2.25 pack-year smoking history. He has never used smokeless tobacco. He reports that he does not drink alcohol or use drugs.    ROS Review of Systems  Constitutional: Negative for chills, diaphoresis, fever and unexpected weight change.  HENT: Negative for congestion, hearing loss, rhinorrhea and sore throat.   Eyes: Negative for visual disturbance.  Respiratory: Negative for cough and shortness of breath.   Cardiovascular: Positive for palpitations. Negative for chest pain.  Gastrointestinal: Positive for diarrhea. Negative for abdominal pain and constipation.  Genitourinary: Negative for dysuria and flank pain.  Musculoskeletal: Positive for arthralgias. Negative for joint swelling.  Skin: Negative for rash.  Neurological: Negative for dizziness and headaches.  Psychiatric/Behavioral: Negative for dysphoric mood and sleep disturbance.    Objective:  BP 135/82   Pulse 78   Temp 98.7 F (37.1 C) (Oral)   Ht '5\' 11"'$  (1.803 m)   Wt 198 lb (89.8 kg)   BMI 27.62 kg/m   BP Readings from Last 3 Encounters:  07/23/16 135/82  07/16/16 148/92  06/04/16 (!) 164/89    Wt Readings from Last 3 Encounters:  07/23/16 198 lb (89.8 kg)  06/04/16 196 lb 4 oz (89 kg)  06/02/16 190 lb (86.2 kg)     Physical Exam  Constitutional: He is oriented to person, place, and time. He appears well-developed and well-nourished. No distress.  HENT:  Head: Normocephalic and atraumatic.  Right Ear: External ear normal.  Left Ear: External ear normal.  Nose: Nose normal.  Mouth/Throat: Oropharynx is clear and moist.  Eyes: Conjunctivae and  EOM are normal. Pupils are equal, round, and reactive to light.  Neck: Normal range of motion. Neck supple.  Cardiovascular: Normal rate, regular rhythm and normal heart sounds.   No murmur heard. Pulmonary/Chest: Effort normal and breath sounds normal. No respiratory  distress. He has no wheezes. He has no rales.  Abdominal: Soft. Bowel sounds are normal. He exhibits no distension. There is no tenderness.  Neurological: He is alert and oriented to person, place, and time. He has normal reflexes.  Skin: Skin is warm and dry.  Psychiatric: He has a normal mood and affect. His behavior is normal. Judgment and thought content normal.    No results found.  Assessment & Plan:   Alyx was seen today for gout.  Diagnoses and all orders for this visit:  Acute idiopathic gout of foot, unspecified laterality  Essential hypertension  Heart palpitations -     Ambulatory referral to Cardiology  Other orders -     colchicine 0.6 MG tablet; Take 1 tablet (0.6 mg total) by mouth 2 (two) times daily.   I have discontinued Mr. Brannigan diltiazem. I have also changed his colchicine. Additionally, I am having him maintain his omeprazole, aspirin EC, LORazepam, fexofenadine, and atenolol.  Allergies as of 07/23/2016      Reactions   Codeine Other (See Comments)   Severe headache, nausea and vomiting   Oxycodone Nausea And Vomiting   Prednisone Other (See Comments)   hyperactivity   Vicodin [hydrocodone-acetaminophen] Nausea And Vomiting   Ciprofloxacin Nausea Only      Medication List       Accurate as of 07/23/16  3:51 PM. Always use your most recent med list.          aspirin EC 81 MG tablet Take 81 mg by mouth every morning.   atenolol 25 MG tablet Commonly known as:  TENORMIN Take 25 mg by mouth daily.   colchicine 0.6 MG tablet Take 1 tablet (0.6 mg total) by mouth 2 (two) times daily.   fexofenadine 180 MG tablet Commonly known as:  ALLEGRA Take 180 mg by mouth every morning.   LORazepam 0.5 MG tablet Commonly known as:  ATIVAN TAKE 1/2 TO 1 TABLET EVERY 8 HOURS AS NEEDED FOR ANXIETY OR SLEEP   omeprazole 40 MG capsule Commonly known as:  PRILOSEC Take 1 capsule (40 mg total) by mouth 2 (two) times daily.        Follow-up:  Return in about 6 weeks (around 09/03/2016).  Claretta Fraise, M.D.

## 2016-07-23 NOTE — Patient Instructions (Signed)
Plan colchicine twice daily until pain resolves completely. Then resume at once daily.  If you exceed 5 loose bowel movements a day while taking colchicine cut back to one per day and follow-up at the office.  After you have been pain free and on 1 daily colchicine for 2 weeks go back on the allopurinol

## 2016-07-28 ENCOUNTER — Other Ambulatory Visit: Payer: Self-pay | Admitting: Family Medicine

## 2016-07-29 ENCOUNTER — Telehealth (HOSPITAL_COMMUNITY): Payer: Self-pay

## 2016-07-29 NOTE — Telephone Encounter (Signed)
Returned pt's call / L/M on machine with call back information. [phone]

## 2016-08-03 DIAGNOSIS — K5730 Diverticulosis of large intestine without perforation or abscess without bleeding: Secondary | ICD-10-CM | POA: Diagnosis not present

## 2016-08-03 DIAGNOSIS — Z86010 Personal history of colonic polyps: Secondary | ICD-10-CM | POA: Diagnosis not present

## 2016-08-06 ENCOUNTER — Telehealth: Payer: Self-pay | Admitting: Family Medicine

## 2016-08-06 DIAGNOSIS — F410 Panic disorder [episodic paroxysmal anxiety] without agoraphobia: Secondary | ICD-10-CM

## 2016-08-06 NOTE — Telephone Encounter (Signed)
What type of referral do you need? To a neuro/psych center. This is for his panic attacks. Call his cell at [phone]  Have you been seen at our office for this problem? yes (If no, schedule them an appointment.  They will need to be seen before a referral can be done.)  Is there a particular doctor or location that you prefer? [facility] [address] in [place]. Phone [phone] ext [phone] josh is Glass blower/designer. Fax is [phone]. They need a referral from dr stacks to see him.  Patient notified that referrals can take up to a week or longer to process. If they haven't heard anything within a week they should call back and speak with the referral department.

## 2016-08-06 NOTE — Telephone Encounter (Signed)
Please refer as requested 

## 2016-08-06 NOTE — Telephone Encounter (Signed)
Referral was ordered per Dr. Livia Snellen  lmtcb jkp 2/23

## 2016-08-09 ENCOUNTER — Encounter: Payer: Self-pay | Admitting: Family Medicine

## 2016-08-09 ENCOUNTER — Ambulatory Visit (INDEPENDENT_AMBULATORY_CARE_PROVIDER_SITE_OTHER): Payer: PPO | Admitting: Family Medicine

## 2016-08-09 VITALS — BP 136/76 | HR 66 | Temp 97.2°F | Ht 71.0 in | Wt 200.0 lb

## 2016-08-09 DIAGNOSIS — R002 Palpitations: Secondary | ICD-10-CM | POA: Diagnosis not present

## 2016-08-09 DIAGNOSIS — J329 Chronic sinusitis, unspecified: Secondary | ICD-10-CM | POA: Diagnosis not present

## 2016-08-09 DIAGNOSIS — J40 Bronchitis, not specified as acute or chronic: Secondary | ICD-10-CM

## 2016-08-09 DIAGNOSIS — F411 Generalized anxiety disorder: Secondary | ICD-10-CM

## 2016-08-09 DIAGNOSIS — I10 Essential (primary) hypertension: Secondary | ICD-10-CM | POA: Diagnosis not present

## 2016-08-09 DIAGNOSIS — M1A0790 Idiopathic chronic gout, unspecified ankle and foot, without tophus (tophi): Secondary | ICD-10-CM

## 2016-08-09 MED ORDER — AMOXICILLIN-POT CLAVULANATE 875-125 MG PO TABS: 1.0000 | ORAL_TABLET | Freq: Two times a day (BID) | ORAL | 0 refills | Status: DC

## 2016-08-09 MED ORDER — ATENOLOL 50 MG PO TABS: 50.0000 mg | ORAL_TABLET | Freq: Every day | ORAL | 2 refills | Status: DC

## 2016-08-09 MED ORDER — FEBUXOSTAT 40 MG PO TABS: 40.0000 mg | ORAL_TABLET | Freq: Every day | ORAL | 5 refills | Status: DC
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
## 2016-08-10 ENCOUNTER — Telehealth: Payer: Self-pay | Admitting: Family Medicine

## 2016-08-12 ENCOUNTER — Ambulatory Visit: Payer: Self-pay | Admitting: Cardiovascular Disease
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
## 2016-08-16 ENCOUNTER — Telehealth: Payer: Self-pay | Admitting: Family Medicine

## 2016-08-16 ENCOUNTER — Telehealth: Payer: Self-pay | Admitting: *Deleted

## 2016-08-16 MED ORDER — AMOXICILLIN 875 MG PO TABS: 875.0000 mg | ORAL_TABLET | Freq: Two times a day (BID) | ORAL | 0 refills | Status: DC

## 2016-08-16 NOTE — Telephone Encounter (Signed)
lmtcb

## 2016-08-16 NOTE — Telephone Encounter (Signed)
Pt has some diarrhea before taking Augmentin but it started on the 28th 4-5 times a day.  He take pro-biotics, unable to eat yogurt not able to tolerate milk products well. States Augmentin does this when he takes it but he is ok with Amoxicillin by itself. Updated allergies reflecting this

## 2016-08-16 NOTE — Telephone Encounter (Signed)
I sent in amoxicillin for 6 days. He should do sinus rinses twice a day, use OTC flonase, take cetirizine or similar allergy pill as well. If not improving needs to be seen.

## 2016-08-16 NOTE — Telephone Encounter (Signed)
Patient wanted to let you know that his atenolol that was increased from 25 mg to 50 mg made him have palpitations about 20 hrs after he took it. He spoke with the pharmacist he is now taking 1/2 in the morning and 1/2 in the evening and it is better

## 2016-08-16 NOTE — Telephone Encounter (Signed)
When did this start and how often is he having loose stools?  He can take pro-biotics, use yogurt with active cultures. He should be almost through with the abx.

## 2016-08-16 NOTE — Telephone Encounter (Signed)
Pt aware of the changes

## 2016-08-16 NOTE — Telephone Encounter (Signed)
What symptoms do you have? Diarrhea from antibiotics. Wants something else.  How long have you been sick? Since he started antibiotic  Have you been seen for this problem? no  If your provider decides to give you a prescription, which pharmacy would you like for it to be sent to? [facility] in [place].   Patient informed that this information will be sent to the clinical staff for review and that they should receive a follow up call.

## 2016-08-16 NOTE — Telephone Encounter (Signed)
Please contact the patient Taking 1/2 of a 50 mg BID is fine with me.

## 2016-08-16 NOTE — Telephone Encounter (Signed)
He only got 3-4 days in - he stopped it.  Still has the sinus and ear trouble  Can it be switched to something else  He CAN NOT take any more of this one. Was on the commode constantly

## 2016-08-16 NOTE — Telephone Encounter (Signed)
He can also take immodium for the diarrhea. If he can tolerate it, he should finish all 10 days as prescribed. He has already taken it for at least 8 days, can stop early if he has to. All antibiotics can cause diarrhea because of changes to gut flora. I dont think he needs a different abx now.

## 2016-08-17 NOTE — Telephone Encounter (Signed)
Patient states that he will continue to take 1/2 '50mg'$  atenolol BID and will call if having anymore problems with medication

## 2016-08-18 NOTE — Telephone Encounter (Signed)
Letter sent with appointment date/time 09/09/16 at 1pm

## 2016-08-22 ENCOUNTER — Other Ambulatory Visit: Payer: Self-pay | Admitting: Family Medicine

## 2016-08-23 NOTE — Telephone Encounter (Signed)
Patient last seen 08/09/2016.  Please advise and route to pools

## 2016-08-31 ENCOUNTER — Other Ambulatory Visit: Payer: Self-pay | Admitting: Family Medicine

## 2016-09-01 ENCOUNTER — Telehealth: Payer: Self-pay | Admitting: Family Medicine

## 2016-09-01 ENCOUNTER — Encounter (HOSPITAL_COMMUNITY): Payer: Self-pay | Admitting: Emergency Medicine

## 2016-09-01 ENCOUNTER — Observation Stay (HOSPITAL_COMMUNITY)
Admission: EM | Admit: 2016-09-01 | Discharge: 2016-09-02 | Disposition: A | Discharge status: Home / Self Care | Payer: PPO | Attending: Internal Medicine | Admitting: Internal Medicine

## 2016-09-01 ENCOUNTER — Observation Stay (HOSPITAL_COMMUNITY): Payer: PPO

## 2016-09-01 ENCOUNTER — Observation Stay (HOSPITAL_BASED_OUTPATIENT_CLINIC_OR_DEPARTMENT_OTHER): Payer: PPO

## 2016-09-01 ENCOUNTER — Emergency Department (HOSPITAL_COMMUNITY): Payer: PPO

## 2016-09-01 ENCOUNTER — Ambulatory Visit: Payer: PPO | Admitting: Neurology

## 2016-09-01 DIAGNOSIS — N289 Disorder of kidney and ureter, unspecified: Secondary | ICD-10-CM | POA: Diagnosis not present

## 2016-09-01 DIAGNOSIS — E876 Hypokalemia: Secondary | ICD-10-CM | POA: Diagnosis not present

## 2016-09-01 DIAGNOSIS — K219 Gastro-esophageal reflux disease without esophagitis: Secondary | ICD-10-CM | POA: Insufficient documentation

## 2016-09-01 DIAGNOSIS — I10 Essential (primary) hypertension: Secondary | ICD-10-CM | POA: Diagnosis not present

## 2016-09-01 DIAGNOSIS — M79609 Pain in unspecified limb: Secondary | ICD-10-CM

## 2016-09-01 DIAGNOSIS — Z880 Allergy status to penicillin: Secondary | ICD-10-CM | POA: Insufficient documentation

## 2016-09-01 DIAGNOSIS — D689 Coagulation defect, unspecified: Secondary | ICD-10-CM | POA: Insufficient documentation

## 2016-09-01 DIAGNOSIS — Z885 Allergy status to narcotic agent status: Secondary | ICD-10-CM | POA: Diagnosis not present

## 2016-09-01 DIAGNOSIS — I493 Ventricular premature depolarization: Secondary | ICD-10-CM | POA: Diagnosis not present

## 2016-09-01 DIAGNOSIS — N179 Acute kidney failure, unspecified: Secondary | ICD-10-CM

## 2016-09-01 DIAGNOSIS — M109 Gout, unspecified: Secondary | ICD-10-CM | POA: Diagnosis present

## 2016-09-01 DIAGNOSIS — M316 Other giant cell arteritis: Secondary | ICD-10-CM | POA: Diagnosis not present

## 2016-09-01 DIAGNOSIS — F419 Anxiety disorder, unspecified: Secondary | ICD-10-CM | POA: Diagnosis not present

## 2016-09-01 DIAGNOSIS — Z8551 Personal history of malignant neoplasm of bladder: Secondary | ICD-10-CM | POA: Insufficient documentation

## 2016-09-01 DIAGNOSIS — M7989 Other specified soft tissue disorders: Secondary | ICD-10-CM

## 2016-09-01 DIAGNOSIS — K449 Diaphragmatic hernia without obstruction or gangrene: Secondary | ICD-10-CM | POA: Insufficient documentation

## 2016-09-01 DIAGNOSIS — I700 Atherosclerosis of aorta: Secondary | ICD-10-CM | POA: Diagnosis not present

## 2016-09-01 DIAGNOSIS — J42 Unspecified chronic bronchitis: Secondary | ICD-10-CM | POA: Diagnosis not present

## 2016-09-01 DIAGNOSIS — R110 Nausea: Secondary | ICD-10-CM | POA: Diagnosis not present

## 2016-09-01 DIAGNOSIS — R42 Dizziness and giddiness: Secondary | ICD-10-CM | POA: Diagnosis not present

## 2016-09-01 DIAGNOSIS — Z7982 Long term (current) use of aspirin: Secondary | ICD-10-CM | POA: Insufficient documentation

## 2016-09-01 DIAGNOSIS — G43B0 Ophthalmoplegic migraine, not intractable: Secondary | ICD-10-CM | POA: Diagnosis not present

## 2016-09-01 DIAGNOSIS — R002 Palpitations: Secondary | ICD-10-CM

## 2016-09-01 DIAGNOSIS — B028 Zoster with other complications: Secondary | ICD-10-CM | POA: Insufficient documentation

## 2016-09-01 DIAGNOSIS — R079 Chest pain, unspecified: Secondary | ICD-10-CM | POA: Diagnosis not present

## 2016-09-01 DIAGNOSIS — E785 Hyperlipidemia, unspecified: Secondary | ICD-10-CM

## 2016-09-01 DIAGNOSIS — Z87891 Personal history of nicotine dependence: Secondary | ICD-10-CM | POA: Insufficient documentation

## 2016-09-01 DIAGNOSIS — R0789 Other chest pain: Secondary | ICD-10-CM | POA: Diagnosis not present

## 2016-09-01 LAB — CBC WITH DIFFERENTIAL/PLATELET
Basophils Absolute: 0 10*3/uL (ref 0.0–0.1)
Basophils Relative: 1 %
Eosinophils Absolute: 0.5 10*3/uL (ref 0.0–0.7)
Eosinophils Relative: 6 %
HCT: 42.1 % (ref 39.0–52.0)
Hemoglobin: 14.5 g/dL (ref 13.0–17.0)
Lymphocytes Relative: 24 %
Lymphs Abs: 1.7 10*3/uL (ref 0.7–4.0)
MCH: 31.7 pg (ref 26.0–34.0)
MCHC: 34.4 g/dL (ref 30.0–36.0)
MCV: 91.9 fL (ref 78.0–100.0)
Monocytes Absolute: 0.8 10*3/uL (ref 0.1–1.0)
Monocytes Relative: 12 %
Neutro Abs: 4.1 10*3/uL (ref 1.7–7.7)
Neutrophils Relative %: 57 %
Platelets: 122 10*3/uL — ABNORMAL LOW (ref 150–400)
RBC: 4.58 MIL/uL (ref 4.22–5.81)
RDW: 14 % (ref 11.5–15.5)
WBC: 7.1 10*3/uL (ref 4.0–10.5)

## 2016-09-01 LAB — ECHOCARDIOGRAM COMPLETE
E/e' ratio: 10.57
FS: 39 % (ref 28–44)
Height: 71.5 in
IVS/LV PW RATIO, ED: 0.89
LA ID, A-P, ES: 35 mm
LA diam end sys: 35 mm
LA diam index: 1.65 cm/m2
LA vol A4C: 50.9 ml
LA vol index: 22.4 mL/m2
LA vol: 47.5 mL
LV E/e' medial: 10.57
LV E/e'average: 10.57
LV PW d: 9 mm — AB (ref 0.6–1.1)
LV e' LATERAL: 8.81 cm/s
LVOT SV: 67 mL
LVOT VTI: 23.6 cm
LVOT area: 2.84 cm2
LVOT diameter: 19 mm
LVOT peak grad rest: 3 mmHg
LVOT peak vel: 90.9 cm/s
Lateral S' vel: 12.1 cm/s
MV Peak grad: 3 mmHg
MV pk A vel: 81 m/s
MV pk E vel: 93.1 m/s
TAPSE: 18 mm
TDI e' lateral: 8.81
TDI e' medial: 7.29
Weight: 3200 oz

## 2016-09-01 LAB — TROPONIN I
Troponin I: <0.03 ng/mL (ref <0.03)
Troponin I: <0.03 ng/mL (ref <0.03)
Troponin I: <0.03 ng/mL (ref <0.03)

## 2016-09-01 LAB — D-DIMER, QUANTITATIVE (NOT AT ARMC): D-Dimer, Quant: 0.45 ug/mL-FEU (ref 0.00–0.50)

## 2016-09-01 LAB — BASIC METABOLIC PANEL
Anion gap: 8 (ref 5–15)
BUN: 14 mg/dL (ref 6–20)
CO2: 25 mmol/L (ref 22–32)
Calcium: 8.7 mg/dL — ABNORMAL LOW (ref 8.9–10.3)
Chloride: 107 mmol/L (ref 101–111)
Creatinine, Ser: 1.27 mg/dL — ABNORMAL HIGH (ref 0.61–1.24)
GFR calc Af Amer: 60 mL/min — ABNORMAL LOW (ref >60)
GFR calc non Af Amer: 52 mL/min — ABNORMAL LOW (ref >60)
Glucose, Bld: 98 mg/dL (ref 65–99)
Potassium: 3.3 mmol/L — ABNORMAL LOW (ref 3.5–5.1)
Sodium: 140 mmol/L (ref 135–145)

## 2016-09-01 LAB — SEDIMENTATION RATE: Sed Rate: 6 mm/hr (ref 0–16)

## 2016-09-01 LAB — TSH: TSH: 5.342 u[IU]/mL — ABNORMAL HIGH (ref 0.350–4.500)

## 2016-09-01 LAB — T4, FREE: Free T4: 0.88 ng/dL (ref 0.61–1.12)

## 2016-09-01 LAB — I-STAT TROPONIN, ED: Troponin i, poc: 0 ng/mL (ref 0.00–0.08)

## 2016-09-01 MED ORDER — ONDANSETRON HCL 4 MG/2ML IJ SOLN
4.0000 mg | Freq: Four times a day (QID) | INTRAMUSCULAR | Status: DC | PRN
  Administered 2016-09-01: 4 mg via INTRAVENOUS
  Filled 2016-09-01: qty 2

## 2016-09-01 MED ORDER — LORATADINE 10 MG PO TABS
10.0000 mg | ORAL_TABLET | Freq: Every day | ORAL | Status: DC
  Administered 2016-09-01 – 2016-09-02 (×2): 10 mg via ORAL
  Filled 2016-09-01 (×2): qty 1

## 2016-09-01 MED ORDER — SODIUM CHLORIDE 0.9% FLUSH: 3.0000 mL | INTRAVENOUS | Status: DC | PRN

## 2016-09-01 MED ORDER — ONDANSETRON HCL 4 MG PO TABS: 4.0000 mg | ORAL_TABLET | Freq: Four times a day (QID) | ORAL | Status: DC | PRN

## 2016-09-01 MED ORDER — HEPARIN SODIUM (PORCINE) 5000 UNIT/ML IJ SOLN
5000.0000 [IU] | Freq: Three times a day (TID) | INTRAMUSCULAR | Status: DC
  Administered 2016-09-01 – 2016-09-02 (×3): 5000 [IU] via SUBCUTANEOUS
  Filled 2016-09-01 (×3): qty 1

## 2016-09-01 MED ORDER — ACETAMINOPHEN 650 MG RE SUPP: 650.0000 mg | Freq: Four times a day (QID) | RECTAL | Status: DC | PRN

## 2016-09-01 MED ORDER — PANTOPRAZOLE SODIUM 40 MG PO TBEC
40.0000 mg | DELAYED_RELEASE_TABLET | Freq: Once | ORAL | Status: AC
  Administered 2016-09-01: 40 mg via ORAL
  Filled 2016-09-01: qty 1

## 2016-09-01 MED ORDER — SODIUM CHLORIDE 0.9 % IV SOLN: 250.0000 mL | INTRAVENOUS | Status: DC | PRN

## 2016-09-01 MED ORDER — PANTOPRAZOLE SODIUM 40 MG PO TBEC
80.0000 mg | DELAYED_RELEASE_TABLET | Freq: Every day | ORAL | Status: DC
  Filled 2016-09-01 (×3): qty 2

## 2016-09-01 MED ORDER — ATENOLOL 25 MG PO TABS
50.0000 mg | ORAL_TABLET | Freq: Two times a day (BID) | ORAL | Status: DC
  Administered 2016-09-01 – 2016-09-02 (×3): 50 mg via ORAL
  Filled 2016-09-01: qty 1
  Filled 2016-09-01 (×2): qty 2

## 2016-09-01 MED ORDER — HEPARIN SODIUM (PORCINE) 5000 UNIT/ML IJ SOLN: 5000.0000 [IU] | Freq: Three times a day (TID) | INTRAMUSCULAR | Status: DC

## 2016-09-01 MED ORDER — SODIUM CHLORIDE 0.9% FLUSH
3.0000 mL | Freq: Two times a day (BID) | INTRAVENOUS | Status: DC
  Administered 2016-09-01 – 2016-09-02 (×2): 3 mL via INTRAVENOUS

## 2016-09-01 MED ORDER — DILTIAZEM HCL 25 MG/5ML IV SOLN
5.0000 mg | Freq: Four times a day (QID) | INTRAVENOUS | Status: DC | PRN
Start: 2016-09-01 — End: 2016-09-02
  Filled 2016-09-01: qty 5

## 2016-09-01 MED ORDER — FEBUXOSTAT 40 MG PO TABS
40.0000 mg | ORAL_TABLET | Freq: Every day | ORAL | Status: DC
  Administered 2016-09-01 – 2016-09-02 (×2): 40 mg via ORAL
  Filled 2016-09-01 (×2): qty 1

## 2016-09-01 MED ORDER — LORAZEPAM 1 MG PO TABS
0.5000 mg | ORAL_TABLET | Freq: Three times a day (TID) | ORAL | Status: DC | PRN
  Administered 2016-09-01: 0.5 mg via ORAL
  Filled 2016-09-01: qty 1

## 2016-09-01 MED ORDER — POTASSIUM CHLORIDE CRYS ER 20 MEQ PO TBCR
40.0000 meq | EXTENDED_RELEASE_TABLET | Freq: Once | ORAL | Status: AC
  Administered 2016-09-01: 40 meq via ORAL
  Filled 2016-09-01: qty 2

## 2016-09-01 MED ORDER — ACETAMINOPHEN 325 MG PO TABS: 650.0000 mg | ORAL_TABLET | Freq: Four times a day (QID) | ORAL | Status: DC | PRN

## 2016-09-01 MED ORDER — PRAVASTATIN SODIUM 40 MG PO TABS
40.0000 mg | ORAL_TABLET | Freq: Every day | ORAL | Status: DC
  Administered 2016-09-01 – 2016-09-02 (×2): 40 mg via ORAL
  Filled 2016-09-01 (×2): qty 1

## 2016-09-01 MED ORDER — POTASSIUM AMINOBENZOATE 500 MG PO CAPS: 1.0000 | ORAL_CAPSULE | ORAL | Status: DC

## 2016-09-01 MED ORDER — COLCHICINE 0.6 MG PO TABS
0.6000 mg | ORAL_TABLET | Freq: Two times a day (BID) | ORAL | Status: DC
  Administered 2016-09-01 – 2016-09-02 (×3): 0.6 mg via ORAL
  Filled 2016-09-01 (×3): qty 1

## 2016-09-01 MED ORDER — GI COCKTAIL ~~LOC~~: 30.0000 mL | Freq: Four times a day (QID) | ORAL | Status: DC | PRN

## 2016-09-01 MED ORDER — SODIUM CHLORIDE 0.9 % IV BOLUS (SEPSIS)
500.0000 mL | Freq: Once | INTRAVENOUS | Status: AC
  Administered 2016-09-01: 500 mL via INTRAVENOUS

## 2016-09-01 MED ORDER — ASPIRIN EC 81 MG PO TBEC
81.0000 mg | DELAYED_RELEASE_TABLET | ORAL | Status: DC
  Administered 2016-09-02: 81 mg via ORAL
  Filled 2016-09-01: qty 1
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
## 2016-09-01 NOTE — Progress Notes (Signed)
VASCULAR LAB PRELIMINARY  PRELIMINARY  PRELIMINARY  PRELIMINARY  Bilateral lower extremity venous duplex completed.    Preliminary report:  Bilateral:  No evidence of DVT, superficial thrombosis, or Baker's Cyst.   Jamere Stidham, RVS 09/01/2016, 7:02 PM

## 2016-09-01 NOTE — ED Triage Notes (Signed)
Pt brought by [facility] from home for left side cp 9/10 on EMS arrival and 3/10 on ED arrival, pt states he is been having same chest tightness for the past 3 days with no relief, he has an appointment with Cardiology this Thursday, but pain is getting him concern. Per EMS some st elevation noticed in some leads on the EKG.  324 mg ASA and 1 nitro sl given by EMS. BP 164/84, HR 65, SPO2 97% on RA. CBG 105.

## 2016-09-01 NOTE — Progress Notes (Signed)
  Echocardiogram 2D Echocardiogram has been performed.  Ronald Russell 09/01/2016, 4:08 PM

## 2016-09-01 NOTE — Progress Notes (Signed)
PHARMACIST - PHYSICIAN ORDER COMMUNICATION  CONCERNING: P&T Medication Policy on Herbal Medications  DESCRIPTION:  This patient's order for:  Potassium aminobenzoate  has been noted.  This product(s) is classified as an "herbal" or natural product. Due to a lack of definitive safety studies or FDA approval, nonstandard manufacturing practices, plus the potential risk of unknown drug-drug interactions while on inpatient medications, the Pharmacy and Therapeutics Committee does not permit the use of "herbal" or natural products of this type within [facility].   ACTION TAKEN: The pharmacy department is unable to verify this order at this time and your patient has been informed of this safety policy. Please reevaluate patient's clinical condition at discharge and address if the herbal or natural product(s) should be resumed at that time.   Elicia Lamp, PharmD, BCPS Clinical Pharmacist 09/01/2016 1:43 PM

## 2016-09-01 NOTE — Progress Notes (Signed)
Patient admitted to 2W35, A&Ox4, VSS. Tele applied and CCMD notified.
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
## 2016-09-01 NOTE — ED Notes (Signed)
While giving patient daily medications patient sat up from a lying position and reported dizziness with nausea. BP re-checked. No significant changes appreciated. MD Marily Memos made aware. Patient also reported fluttering in chest however HR not above 70 but irregular. MD Marily Memos made aware.

## 2016-09-02 ENCOUNTER — Other Ambulatory Visit: Payer: Self-pay | Admitting: Physician Assistant

## 2016-09-02 ENCOUNTER — Ambulatory Visit: Payer: PPO | Admitting: Cardiology

## 2016-09-02 ENCOUNTER — Encounter (HOSPITAL_COMMUNITY): Payer: Self-pay | Admitting: General Practice

## 2016-09-02 DIAGNOSIS — R002 Palpitations: Secondary | ICD-10-CM

## 2016-09-02 DIAGNOSIS — E876 Hypokalemia: Secondary | ICD-10-CM | POA: Diagnosis not present

## 2016-09-02 DIAGNOSIS — N179 Acute kidney failure, unspecified: Secondary | ICD-10-CM | POA: Diagnosis not present

## 2016-09-02 DIAGNOSIS — R079 Chest pain, unspecified: Secondary | ICD-10-CM | POA: Diagnosis not present

## 2016-09-02 DIAGNOSIS — R0789 Other chest pain: Secondary | ICD-10-CM

## 2016-09-02 LAB — COMPREHENSIVE METABOLIC PANEL
ALT: 18 U/L (ref 17–63)
AST: 19 U/L (ref 15–41)
Albumin: 3.3 g/dL — ABNORMAL LOW (ref 3.5–5.0)
Alkaline Phosphatase: 43 U/L (ref 38–126)
Anion gap: 8 (ref 5–15)
BUN: 10 mg/dL (ref 6–20)
CO2: 28 mmol/L (ref 22–32)
Calcium: 8.6 mg/dL — ABNORMAL LOW (ref 8.9–10.3)
Chloride: 106 mmol/L (ref 101–111)
Creatinine, Ser: 1.2 mg/dL (ref 0.61–1.24)
GFR calc Af Amer: >60 mL/min (ref >60)
GFR calc non Af Amer: 55 mL/min — ABNORMAL LOW (ref >60)
Glucose, Bld: 86 mg/dL (ref 65–99)
Potassium: 4 mmol/L (ref 3.5–5.1)
Sodium: 142 mmol/L (ref 135–145)
Total Bilirubin: 0.9 mg/dL (ref 0.3–1.2)
Total Protein: 5.6 g/dL — ABNORMAL LOW (ref 6.5–8.1)

## 2016-09-02 LAB — LIPID PANEL
Cholesterol: 127 mg/dL (ref 0–200)
HDL: 34 mg/dL — ABNORMAL LOW (ref >40)
LDL Cholesterol: 77 mg/dL (ref 0–99)
Total CHOL/HDL Ratio: 3.7 RATIO
Triglycerides: 82 mg/dL (ref <150)
VLDL: 16 mg/dL (ref 0–40)

## 2016-09-02 LAB — T3, FREE: T3, Free: 3.4 pg/mL (ref 2.0–4.4)

## 2016-09-02 LAB — HEMOGLOBIN A1C
Hgb A1c MFr Bld: 5 % (ref 4.8–5.6)
Mean Plasma Glucose: 97 mg/dL

## 2016-09-02 NOTE — Progress Notes (Signed)
Pt said he has shingles in his right arm.  No open sore but faintly visible white bumps going up pt's right arm.  Put on contact precautions until doctors say otherwise.  Will continue to monitor pt.  Lupita Dawn, RN

## 2016-09-02 NOTE — Discharge Summary (Signed)
[facility]  Physician Discharge Summary   Patient ID: Ronald Russell MRN: [id number] DOB/AGE: January 24, 1935 82 y.o.  Admit date: 09/01/2016 Discharge date: 09/02/2016  PCP: Claretta Fraise, MD  DISCHARGE DIAGNOSES:  Active Problems:   Gout   Chest pain   Palpitations   Renal insufficiency   HLD (hyperlipidemia)   Hypokalemia   Anxiety   Localized swelling of lower extremity   RECOMMENDATIONS FOR OUTPATIENT FOLLOW UP: 1. Outpatient follow-up with cardiology 2. Patient to also follow-up with his neurologist for headaches   DISCHARGE CONDITION: fair  Diet recommendation: As before  [facility] Weights   09/01/16 0407 09/01/16 1901  Weight: 90.7 kg (200 lb) 88.8 kg (195 lb 11.2 oz)    INITIAL HISTORY: 82 y.o. male with medical history significant of bladder cancer status post transurethral resection, clotting disorder, GERD, hypertension, temporal arteritis, anxiety, presented with 2 day history of chest pain precipitated by palpitations. Episodes are intermittent and without specific aggravating or alleviating factors. Associated with shortness of breath and dyspnea on exertion. Substernal left chest with radiation to the back and upper chest. Patient recently increased his atenolol from once daily to twice daily without improvement in his chronic palpitations. Episodes getting worse. Associated with palpitations and dizziness with a sensation of lightheadedness.   EMS was called to patient's house and patient was given ASA 324 and sublingual nitroglycerin 1 with significant improvement in chest pain.  Consultations:  Cardiology  Procedures: Transthoracic echocardiogram Study Conclusions  - Left ventricle: The cavity size was normal. Wall thickness was   normal. Systolic function was normal. The estimated ejection   fraction was in the range of 60% to 65%. Wall motion was normal;   there were no regional wall motion abnormalities. Features are   consistent with a  pseudonormal left ventricular filling pattern,   with concomitant abnormal relaxation and increased filling   pressure (grade 2 diastolic dysfunction). - Aortic valve: There was no stenosis. - Mitral valve: There was no significant regurgitation. - Right ventricle: The cavity size was normal. Systolic function   was normal. - Pulmonary arteries: No complete TR doppler jet so unable to   estimate PA systolic pressure. - Systemic veins: IVC was not visualized.  Impressions:  - Normal LV size with EF 60-65%. Normal RV size and systolic   function. No significant valvular abnormalities.  Lower extremity venous Doppler Bilateral:  No evidence of DVT, superficial thrombosis, or Baker's Cyst.  HOSPITAL COURSE:   CP/palpitations HEART score 6. Patient was admitted to the hospital. Patient seen by cardiology. Echocardiogram does not show any wall motion abnormalities. Patient ruled out for acute chronic syndrome. Palpitations have resolved. TSH was 5.3 with normal free T4. Etiology for his presentation is not entirely clear. Outpatient follow-up with cardiology.   Mild Acute Renal insufficiency Cr 1.27. Baseline 1.1. He was given IV fluids. Creatinine has improved.  LE swelling Primairly in the L. Ongoing for several weeks. More sedentary than usual due to recent illnesses. Doppler studies negative for DVT. No pitting edema identified. Echocardiogram did show grade 2 diastolic dysfunction. We'll hold off on initiating diuretics for now. Defer this to cardiology to determine as outpatient.   Gout: - continue Uloric, colchicine  GERD: - continue ppi  Hyperlipidemia - continue statin  History of headache He is followed by Dr. Jaynee Eagles with neurology. Previously there was some suspicion for temporal arteritis and he has been on steroids previously. ESR was checked and is normal. Patient stated that he will follow-up with his  neurologist. Denies severe headaches currently. This is been  ongoing issue for him.  Overall, stable. Patient wishes to go home. Stable for discharge. Ambulated in the hallway. Clear by cardiology.  PERTINENT LABS:  The results of significant diagnostics from this hospitalization (including imaging, microbiology, ancillary and laboratory) are listed below for reference.      Labs: Basic Metabolic Panel:  Recent Labs Lab 09/01/16 0420 09/02/16 0227  NA 140 142  K 3.3* 4.0  CL 107 106  CO2 25 28  GLUCOSE 98 86  BUN 14 10  CREATININE 1.27* 1.20  CALCIUM 8.7* 8.6*   Liver Function Tests:  Recent Labs Lab 09/02/16 0227  AST 19  ALT 18  ALKPHOS 43  BILITOT 0.9  PROT 5.6*  ALBUMIN 3.3*   CBC:  Recent Labs Lab 09/01/16 0420  WBC 7.1  NEUTROABS 4.1  HGB 14.5  HCT 42.1  MCV 91.9  PLT 122*   Cardiac Enzymes:  Recent Labs Lab 09/01/16 0945 09/01/16 1251 09/01/16 1444  TROPONINI <0.03 <0.03 <0.03     IMAGING STUDIES Ct Head Wo Contrast  Result Date: 09/01/2016 CLINICAL DATA:  Dizziness, nausea. EXAM: CT HEAD WITHOUT CONTRAST TECHNIQUE: Contiguous axial images were obtained from the base of the skull through the vertex without intravenous contrast. COMPARISON:  CT scan of March 15, 2016. FINDINGS: Brain: Mild chronic ischemic white matter disease is noted. No mass effect or midline shift is noted. Ventricular size is within normal limits. There is no evidence of mass lesion, hemorrhage or acute infarction. Vascular: Atherosclerosis of carotid siphons is noted. Skull: Normal. Negative for fracture or focal lesion. Sinuses/Orbits: No acute finding. Other: None. IMPRESSION: Mild chronic ischemic white matter disease. No acute intracranial abnormality seen. Electronically Signed   By: Marijo Conception, M.D.   On: 09/01/2016 12:15   Dg Chest Port 1 View  Result Date: 09/01/2016 CLINICAL DATA:  82 year old male with chest pain. EXAM: PORTABLE CHEST 1 VIEW COMPARISON:  Chest radiograph dated 12/25/2015 FINDINGS: The lungs are  clear. There is no pleural effusion or pneumothorax. The cardiac silhouette is within normal limits. Atherosclerotic calcification of the aortic arch. The aorta is somewhat tortuous. A small hiatal hernia is noted. No acute osseous pathology. IMPRESSION: 1. No acute cardiopulmonary process. 2. Small hiatal hernia. Electronically Signed   By: Anner Crete M.D.   On: 09/01/2016 05:04    DISCHARGE EXAMINATION: Vitals:   09/01/16 1359 09/01/16 1901 09/01/16 2148 09/02/16 0500  BP: 126/62 109/62 118/73 123/61  Pulse: 60 (!) 58  61  Resp: _0 Temp: 97.9 F (36.6 C) 97.9 F (36.6 C)  98.4 F (36.9 C)  TempSrc: Oral Oral  Oral  SpO2: 99% 98%  97%  Weight:  88.8 kg (195 lb 11.2 oz)    Height:  _1  (1.803 m)     General appearance: alert, cooperative, appears stated age and no distress Resp: clear to auscultation bilaterally Cardio: regular rate and rhythm, S1, S2 normal, no murmur, click, rub or gallop GI: soft, non-tender; bowel sounds normal; no masses,  no organomegaly Extremities: extremities normal, atraumatic, no cyanosis or edema   DISPOSITION: Home  Discharge Instructions    Call MD for:  difficulty breathing, headache or visual disturbances    Complete by:  As directed    Call MD for:  extreme fatigue    Complete by:  As directed    Call MD for:  persistant dizziness or light-headedness    Complete by:  As directed    Call MD for:  persistant nausea and vomiting    Complete by:  As directed    Call MD for:  severe uncontrolled pain    Complete by:  As directed    Call MD for:  temperature >100.4    Complete by:  As directed    Diet - low sodium heart healthy    Complete by:  As directed    Discharge instructions    Complete by:  As directed    Please follow up with cardiology as scheduled and with Dr. Jaynee Eagles for your headaches.  You were cared for by a hospitalist during your hospital stay. If you have any questions about your discharge medications or the  care you received while you were in the hospital after you are discharged, you can call the unit and asked to speak with the hospitalist on call if the hospitalist that took care of you is not available. Once you are discharged, your primary care physician will handle any further medical issues. Please note that NO REFILLS for any discharge medications will be authorized once you are discharged, as it is imperative that you return to your primary care physician (or establish a relationship with a primary care physician if you do not have one) for your aftercare needs so that they can reassess your need for medications and monitor your lab values. If you do not have a primary care physician, you can call [phone] for a physician referral.   Increase activity slowly    Complete by:  As directed       ALLERGIES:  Allergies  Allergen Reactions  . Codeine Other (See Comments)    Severe headache, nausea and vomiting  . Oxycodone Nausea And Vomiting  . Prednisone Other (See Comments)    hyperactivity  . Vicodin [Hydrocodone-Acetaminophen] Nausea And Vomiting  . Augmentin [Amoxicillin-Pot Clavulanate] Diarrhea  . Ciprofloxacin Nausea Only     Discharge Medication List as of 09/02/2016 11:44 AM    CONTINUE these medications which have NOT CHANGED   Details  aspirin EC 81 MG tablet Take 81 mg by mouth every morning. , Historical Med    atenolol (TENORMIN) 50 MG tablet Take 1 tablet (50 mg total) by mouth daily., Starting Mon 08/09/2016, Normal    cholecalciferol (VITAMIN D) 1000 units tablet Take 1,000 Units by mouth every other day. , Historical Med    colchicine 0.6 MG tablet Take 1 tablet (0.6 mg total) by mouth 2 (two) times daily., Starting Fri 07/23/2016, Normal    febuxostat (ULORIC) 40 MG tablet Take 1 tablet (40 mg total) by mouth daily. To prevent gout, Starting Mon 08/09/2016, Normal    fexofenadine (ALLEGRA) 180 MG tablet Take 180 mg by mouth every morning., Historical Med      LORazepam (ATIVAN) 0.5 MG tablet TAKE 1/2 TO 1 TABLET EVERY 8 HOURS AS NEEDED FOR ANXIETY/SLEEP, Print    omeprazole (PRILOSEC) 40 MG capsule TAKE 1 CAPSULE (40 MG TOTAL) BY MOUTH 2 (TWO) TIMES DAILY., Starting Wed 07/28/2016, Normal    Potassium Aminobenzoate 500 MG CAPS Take 1 capsule by mouth every other day. , Historical Med    pravastatin (PRAVACHOL) 40 MG tablet Take 40 mg by mouth daily., Historical Med         Follow-up Information    Dorris Carnes, MD. Go on 10/15/2016.   Specialty:  Cardiology Why:  _0 :20 am for post hospital follow up  Office will call with monitor placement  Contact  information: [place] [address] [address] [phone]        Jory Sims, NP. Go on 09/14/2016.   Specialties:  Nurse Practitioner, Radiology, Cardiology Why:  _0 :45 pm for cardiology follow up Contact information: [place] [address] [phone]        Melvenia Beam, MD. Schedule an appointment as soon as possible for a visit in 1 week(s).   Specialty:  Neurology Contact information: [place] [address] [phone]           TOTAL DISCHARGE TIME: 35 mins  [facility] Pager [phone]  09/02/2016, 4:18 PM

## 2016-09-02 NOTE — Progress Notes (Signed)
Ronald Russell to be D/C'd Home per MD order. Discussed with the patient and all questions fully answered.    VVS, Skin clean, dry and intact without evidence of skin break down, no evidence of skin tears noted.  IV catheter discontinued intact. Site without signs and symptoms of complications. Dressing and pressure applied.  An After Visit Summary was printed and given to the patient.  Patient escorted via [place], and D/C home via private auto.  [facility]  09/02/2016 12:59 PM

## 2016-09-02 NOTE — Progress Notes (Signed)
Pt's wallet, 3 credit cards, and cash of $299.00 have been sent to a safe in security.    Lupita Dawn, RN

## 2016-09-02 NOTE — Progress Notes (Signed)
[facility] Nurse contacted and informed did not need to put pt on isolation as long as there are no open sores.  Taking off contact precautions.  Lupita Dawn, RN

## 2016-09-02 NOTE — Care Management Obs Status (Signed)
[place] NOTIFICATION   Patient Details  Name: Ronald Russell MRN: [id number] Date of Birth: [birth year]   Medicare Observation Status Notification Given:  Yes    Dawayne Patricia, RN 09/02/2016, 10:22 AM

## 2016-09-02 NOTE — Progress Notes (Signed)
Progress Note  Patient Name: Ronald Russell Date of Encounter: 09/02/2016  Primary Cardiologist: New  Subjective   No dizzines  No palpitations  No chest tightness  Inpatient Medications    Scheduled Meds: . aspirin EC  81 mg Oral [id number]  . atenolol  50 mg Oral BID  . colchicine  0.6 mg Oral BID  . febuxostat  40 mg Oral Daily  . heparin  5,000 Units Subcutaneous Q8H  . loratadine  10 mg Oral Daily  . pantoprazole  80 mg Oral Daily  . pravastatin  40 mg Oral Daily  . sodium chloride flush  3 mL Intravenous Q12H   Continuous Infusions:  PRN Meds: sodium chloride, acetaminophen **OR** acetaminophen, diltiazem, gi cocktail, LORazepam, ondansetron **OR** ondansetron (ZOFRAN) IV, sodium chloride flush   Vital Signs    Vitals:   09/01/16 1359 09/01/16 1901 09/01/16 2148 09/02/16 0500  BP: 126/62 109/62 118/73 123/61  Pulse: 60 (!) 58  61  Resp: '20 18  20  '$ Temp: 97.9 F (36.6 C) 97.9 F (36.6 C)  98.4 F (36.9 C)  TempSrc: Oral Oral  Oral  SpO2: 99% 98%  97%  Weight:  195 lb 11.2 oz (88.8 kg)    Height:  '5\' 11"'$  (1.803 m)      Intake/Output Summary (Last 24 hours) at 09/02/16 0819 Last data filed at 09/01/16 1316  Gross per 24 hour  Intake              500 ml  Output                0 ml  Net              500 ml   Filed Weights   09/01/16 0407 09/01/16 1901  Weight: 200 lb (90.7 kg) 195 lb 11.2 oz (88.8 kg)    Telemetry    SR- Personally Reviewed  ECG      Physical Exam   GEN: No acute distress.   Neck: No JVD Cardiac: RRR, no murmurs, rubs, or gallops.  Respiratory: Clear to auscultation bilaterally. GI: Soft, nontender, non-distended  MS: No edema; No deformity. Neuro:  Nonfocal  Psych: Normal affect   Labs    Chemistry Recent Labs Lab 09/01/16 0420 09/02/16 0227  NA 140 142  K 3.3* 4.0  CL 107 106  CO2 25 28  GLUCOSE 98 86  BUN 14 10  CREATININE 1.27* 1.20  CALCIUM 8.7* 8.6*  PROT  --  5.6*  ALBUMIN  --  3.3*  AST  --  19    ALT  --  18  ALKPHOS  --  43  BILITOT  --  0.9  GFRNONAA 52* 55*  GFRAA 60* >60  ANIONGAP 8 8     Hematology Recent Labs Lab 09/01/16 0420  WBC 7.1  RBC 4.58  HGB 14.5  HCT 42.1  MCV 91.9  MCH 31.7  MCHC 34.4  RDW 14.0  PLT 122*    Cardiac Enzymes Recent Labs Lab 09/01/16 0945 09/01/16 1251 09/01/16 1444  TROPONINI <0.03 <0.03 <0.03    Recent Labs Lab 09/01/16 0426  TROPIPOC 0.00     BNPNo results for input(s): BNP, PROBNP in the last 168 hours.   DDimer  Recent Labs Lab 09/01/16 1710  DDIMER 0.45     Radiology    Ct Head Wo Contrast  Result Date: 09/01/2016 CLINICAL DATA:  Dizziness, nausea. EXAM: CT HEAD WITHOUT CONTRAST TECHNIQUE: Contiguous axial images were obtained from  the base of the skull through the vertex without intravenous contrast. COMPARISON:  CT scan of March 15, 2016. FINDINGS: Brain: Mild chronic ischemic white matter disease is noted. No mass effect or midline shift is noted. Ventricular size is within normal limits. There is no evidence of mass lesion, hemorrhage or acute infarction. Vascular: Atherosclerosis of carotid siphons is noted. Skull: Normal. Negative for fracture or focal lesion. Sinuses/Orbits: No acute finding. Other: None. IMPRESSION: Mild chronic ischemic white matter disease. No acute intracranial abnormality seen. Electronically Signed   By: Marijo Conception, M.D.   On: 09/01/2016 12:15   Dg Chest Port 1 View  Result Date: 09/01/2016 CLINICAL DATA:  82 year old male with chest pain. EXAM: PORTABLE CHEST 1 VIEW COMPARISON:  Chest radiograph dated 12/25/2015 FINDINGS: The lungs are clear. There is no pleural effusion or pneumothorax. The cardiac silhouette is within normal limits. Atherosclerotic calcification of the aortic arch. The aorta is somewhat tortuous. A small hiatal hernia is noted. No acute osseous pathology. IMPRESSION: 1. No acute cardiopulmonary process. 2. Small hiatal hernia. Electronically Signed   By: Anner Crete M.D.   On: 09/01/2016 05:04    Cardiac Studies   Echo  Study Conclusions  - Left ventricle: The cavity size was normal. Wall thickness was   normal. Systolic function was normal. The estimated ejection   fraction was in the range of 60% to 65%. Wall motion was normal;   there were no regional wall motion abnormalities. Features are   consistent with a pseudonormal left ventricular filling pattern,   with concomitant abnormal relaxation and increased filling   pressure (grade 2 diastolic dysfunction). - Aortic valve: There was no stenosis. - Mitral valve: There was no significant regurgitation. - Right ventricle: The cavity size was normal. Systolic function   was normal. - Pulmonary arteries: No complete TR doppler jet so unable to   estimate PA systolic pressure. - Systemic veins: IVC was not visualized.  Impressions:  - Normal LV size with EF 60-65%. Normal RV size and systolic   function. No significant valvular abnormalities.  Patient Profile       Assessment & Plan    1  Palpitations  Denies  Tele neg  2  Chest pressure  No symptoms  WOuld ambulate an follow  Echo OK  D Dmier without signif elevation   Orthostatics negative   From a cardiac standpont I think he is OK to go if feels OK after ambulation  Have not found cause for symtpoms  I? If related to recent diarrhea  I am not convinced of any ischemia Will arrange for outpt event monitor     Signed, Dorris Carnes, MD  09/02/2016, 8:19 AM

## 2016-09-03 ENCOUNTER — Ambulatory Visit (INDEPENDENT_AMBULATORY_CARE_PROVIDER_SITE_OTHER): Payer: PPO

## 2016-09-03 ENCOUNTER — Telehealth: Payer: Self-pay | Admitting: Internal Medicine

## 2016-09-03 DIAGNOSIS — R002 Palpitations: Secondary | ICD-10-CM | POA: Diagnosis not present

## 2016-09-03 NOTE — Telephone Encounter (Signed)
New Message  Patient c/o Palpitations:  High priority if patient c/o lightheadedness and shortness of breath.  1. How long have you been having palpitations? Per pt not the first time. Last night heart skipping beats. Fullness   2. Are you currently experiencing lightheadedness and shortness of breath? no  3. Have you checked your BP and heart rate? (document readings)  he's afraid to do so  4. Are you experiencing any other symptoms?

## 2016-09-03 NOTE — Telephone Encounter (Signed)
Patient calling and states that he felt a little fluttering in his chest last night and some this morning. Patient denies any SOB, lightheadedness, dizziness, or chest pain. Patient states that when he feels this fluttering in his chest that he gets very anxious. Patient had not taken his atenolol this morning, so he was instructed to take his atenolol. Patient was also instructed to take his lorazepam for anxiety. Dr. Harrington Challenger seen the patient in the hospital yesterday and ordered an outpatient event monitor. I contacted Davy Pique to give the patient a call to arrange for patient to have this placed. Patient made aware that he would be getting a call soon. Patient verbalized understanding.

## 2016-09-06 ENCOUNTER — Telehealth: Payer: Self-pay | Admitting: Family Medicine

## 2016-09-06 NOTE — Telephone Encounter (Signed)
Have you seen RX? On print and Caryl Pina looked and its not in the box.

## 2016-09-07 ENCOUNTER — Telehealth: Payer: Self-pay

## 2016-09-07 ENCOUNTER — Ambulatory Visit: Payer: Self-pay | Admitting: Neurology

## 2016-09-07 MED ORDER — LORAZEPAM 0.5 MG PO TABS: ORAL_TABLET | ORAL | 3 refills | Status: DC

## 2016-09-07 NOTE — Telephone Encounter (Signed)
Okay at current level for 6 mos. Thanks ws 

## 2016-09-07 NOTE — Telephone Encounter (Signed)
Pt called and cancelled same day appt. He has had recent hospitalization and says that he'll call back to r/s when feeling better.

## 2016-09-07 NOTE — Telephone Encounter (Signed)
Patient called requesting Rx for Lorazepam.  There is no Rx in the Box.

## 2016-09-07 NOTE — Telephone Encounter (Signed)
Rx called to pharmacy.  Patient aware

## 2016-09-07 NOTE — Addendum Note (Signed)
Addended by: Wardell Heath on: 09/07/2016 03:26 PM   Modules accepted: Orders

## 2016-09-10 ENCOUNTER — Ambulatory Visit: Payer: PPO | Admitting: Cardiovascular Disease

## 2016-09-10 ENCOUNTER — Encounter: Payer: Self-pay | Admitting: Neurology

## 2016-09-14 ENCOUNTER — Ambulatory Visit: Payer: PPO | Admitting: Adult Health
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
## 2016-09-16 ENCOUNTER — Other Ambulatory Visit: Payer: Self-pay | Admitting: Pediatrics

## 2016-09-20 ENCOUNTER — Encounter: Payer: Self-pay | Admitting: Family Medicine

## 2016-09-20 ENCOUNTER — Ambulatory Visit (INDEPENDENT_AMBULATORY_CARE_PROVIDER_SITE_OTHER): Payer: PPO | Admitting: Family Medicine

## 2016-09-20 VITALS — BP 124/71 | HR 60 | Temp 97.4°F | Ht 71.0 in | Wt 200.0 lb

## 2016-09-20 DIAGNOSIS — J0100 Acute maxillary sinusitis, unspecified: Secondary | ICD-10-CM

## 2016-09-20 DIAGNOSIS — R002 Palpitations: Secondary | ICD-10-CM

## 2016-09-20 DIAGNOSIS — E559 Vitamin D deficiency, unspecified: Secondary | ICD-10-CM | POA: Diagnosis not present

## 2016-09-20 MED ORDER — AMOXICILLIN 500 MG PO TABS: 500.0000 mg | ORAL_TABLET | Freq: Three times a day (TID) | ORAL | 0 refills | Status: AC

## 2016-09-20 MED ORDER — BETAMETHASONE SOD PHOS & ACET 6 (3-3) MG/ML IJ SUSP
6.0000 mg | Freq: Once | INTRAMUSCULAR | Status: AC
  Administered 2016-09-20: 6 mg via INTRAMUSCULAR

## 2016-09-20 NOTE — Progress Notes (Signed)
Subjective:  Patient ID: Ronald Russell, male    DOB: 04/19/1935  Age: 82 y.o. MRN: [id number]  CC: Tinnitus (pt here today c/o ringing in the ears and "behind my eyes are hot")   HPI Ronald Russell presents for Symptoms include congestion, facial pain, nasal congestion, non productive cough, post nasal drip and sinus pressure. There is no fever, chills, or sweats. Onset of symptoms was a few days ago, gradually worsening since that time. Ringing in ears and decreased hearing noted. Pt. requests ENT eval.  Recently saw cardiology for palpitations. Potassium was low. Requesting blood test.   History Ronald Russell has a past medical history of Allergy; Anxiety; Bladder cancer ([place]) (2010); Cataract; Chronic bronchitis ([place]); Clotting disorder ([place]); Exertional shortness of breath; GERD (gastroesophageal reflux disease); History of blood transfusion (1949); HOH (hard of hearing); Hypertension; Ocular migraine; Pneumonia; PONV (postoperative nausea and vomiting); Seasonal allergies; Shingles; and Temporal arteritis ([place]).   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   His family history includes Alzheimer's disease in his father; Cancer in his mother.He reports that he has quit smoking. His smoking use included Cigarettes. He has a 2.25 pack-year smoking history. He has never used smokeless tobacco. He reports that he does not drink alcohol or use drugs.  Current Outpatient Prescriptions on File Prior to Visit  Medication Sig Dispense Refill  . aspirin EC 81 MG tablet Take 81 mg by mouth every morning.     Marland Kitchen atenolol (TENORMIN) 50 MG tablet Take 1 tablet (50 mg total) by mouth daily. (Patient taking differently: Take 50 mg by mouth 2 (two) times daily. ) 30 tablet 2    . colchicine 0.6 MG tablet Take 1 tablet (0.6 mg total) by mouth 2 (two) times daily. 60 tablet 5  . febuxostat (ULORIC) 40 MG tablet Take 1 tablet (40 mg total) by mouth daily. To prevent gout 30 tablet 5  . fexofenadine (ALLEGRA) 180 MG tablet Take 180 mg by mouth every morning.    Marland Kitchen LORazepam (ATIVAN) 0.5 MG tablet TAKE 1/2 TO 1 TABLET EVERY 8 HOURS AS NEEDED FOR ANXIETY/SLEEP 120 tablet 3  . omeprazole (PRILOSEC) 40 MG capsule TAKE 1 CAPSULE (40 MG TOTAL) BY MOUTH 2 (TWO) TIMES DAILY. 180 capsule 1  . Potassium Aminobenzoate 500 MG CAPS Take 1 capsule by mouth every other day.     . pravastatin (PRAVACHOL) 40 MG tablet Take 40 mg by mouth daily.    . [DISCONTINUED] budesonide-formoterol (SYMBICORT) 80-4.5 MCG/ACT inhaler Inhale 2 puffs into the lungs 2 (two) times daily.    . [DISCONTINUED] diphenhydrAMINE (SOMINEX) 25 MG tablet Take 1 tablet (25 mg total) by mouth at bedtime as needed for allergies or sleep. 30 tablet   . [DISCONTINUED] fluticasone (FLONASE) 50 MCG/ACT nasal spray Place 2 sprays into the nose 2 (two) times daily.     No current facility-administered medications on file prior to visit.     ROS Review of Systems  Constitutional: Negative for activity change, appetite change, chills and fever.  HENT: Positive for congestion, postnasal drip, rhinorrhea and sinus pressure. Negative for ear discharge, ear pain, hearing loss, nosebleeds, sneezing and trouble swallowing.   Respiratory: Negative for chest tightness and shortness of breath.   Cardiovascular: Negative for chest pain and palpitations.  Skin: Negative for rash.  Objective:  BP 124/71   Pulse 60   Temp 97.4 F (36.3 C) (Oral)   Ht _0  (1.803 m)   Wt 200 lb (90.7 kg)   BMI 27.89 kg/m   Physical Exam  Constitutional: He appears well-developed and well-nourished.  HENT:  Head: Normocephalic and atraumatic.  Right Ear: Tympanic membrane and external ear normal. No decreased hearing is noted.  Left  Ear: Tympanic membrane and external ear normal. No decreased hearing is noted.  Nose: Mucosal edema present. Right sinus exhibits no frontal sinus tenderness. Left sinus exhibits no frontal sinus tenderness.  Mouth/Throat: No oropharyngeal exudate or posterior oropharyngeal erythema.  Neck: No Brudzinski's sign noted.  Pulmonary/Chest: Breath sounds normal. No respiratory distress.  Lymphadenopathy:       Head (right side): No preauricular adenopathy present.       Head (left side): No preauricular adenopathy present.       Right cervical: No superficial cervical adenopathy present.      Left cervical: No superficial cervical adenopathy present.    Assessment & Plan:   Damaree was seen today for tinnitus.  Diagnoses and all orders for this visit:  Acute maxillary sinusitis, recurrence not specified -     betamethasone acetate-betamethasone sodium phosphate (CELESTONE) injection 6 mg; Inject 1 mL (6 mg total) into the muscle once. -     Ambulatory referral to ENT  Vitamin D deficiency -     VITAMIN D 25 Hydroxy (Vit-D Deficiency, Fractures)  Heart palpitations -     BMP8+EGFR  Other orders -     amoxicillin (AMOXIL) 500 MG tablet; Take 1 tablet (500 mg total) by mouth 3 (three) times daily.   I have discontinued Ronald Russell cholecalciferol. I am also having him start on amoxicillin. Additionally, I am having him maintain his aspirin EC, fexofenadine, colchicine, omeprazole, atenolol, febuxostat, pravastatin, Potassium Aminobenzoate, and LORazepam. We will continue to administer betamethasone acetate-betamethasone sodium phosphate.  Meds ordered this encounter  Medications  . betamethasone acetate-betamethasone sodium phosphate (CELESTONE) injection 6 mg  . amoxicillin (AMOXIL) 500 MG tablet    Sig: Take 1 tablet (500 mg total) by mouth 3 (three) times daily.    Dispense:  30 tablet    Refill:  0     Follow-up: Return if symptoms worsen or fail to improve.  Claretta Fraise,  M.D.

## 2016-09-21 ENCOUNTER — Telehealth: Payer: Self-pay | Admitting: Internal Medicine

## 2016-09-21 ENCOUNTER — Emergency Department (HOSPITAL_COMMUNITY): Payer: PPO

## 2016-09-21 ENCOUNTER — Emergency Department (HOSPITAL_COMMUNITY)
Admission: EM | Admit: 2016-09-21 | Discharge: 2016-09-21 | Disposition: A | Discharge status: Home / Self Care | Payer: PPO | Attending: Emergency Medicine | Admitting: Emergency Medicine

## 2016-09-21 ENCOUNTER — Encounter (HOSPITAL_COMMUNITY): Payer: Self-pay | Admitting: Emergency Medicine

## 2016-09-21 DIAGNOSIS — I10 Essential (primary) hypertension: Secondary | ICD-10-CM | POA: Insufficient documentation

## 2016-09-21 DIAGNOSIS — Z7982 Long term (current) use of aspirin: Secondary | ICD-10-CM | POA: Insufficient documentation

## 2016-09-21 DIAGNOSIS — Z87891 Personal history of nicotine dependence: Secondary | ICD-10-CM | POA: Insufficient documentation

## 2016-09-21 DIAGNOSIS — Z79899 Other long term (current) drug therapy: Secondary | ICD-10-CM | POA: Insufficient documentation

## 2016-09-21 DIAGNOSIS — R002 Palpitations: Secondary | ICD-10-CM | POA: Diagnosis not present

## 2016-09-21 DIAGNOSIS — R0789 Other chest pain: Secondary | ICD-10-CM | POA: Diagnosis not present

## 2016-09-21 DIAGNOSIS — R42 Dizziness and giddiness: Secondary | ICD-10-CM | POA: Diagnosis not present

## 2016-09-21 DIAGNOSIS — I493 Ventricular premature depolarization: Secondary | ICD-10-CM | POA: Insufficient documentation

## 2016-09-21 DIAGNOSIS — Z8551 Personal history of malignant neoplasm of bladder: Secondary | ICD-10-CM | POA: Insufficient documentation

## 2016-09-21 DIAGNOSIS — R404 Transient alteration of awareness: Secondary | ICD-10-CM | POA: Diagnosis not present

## 2016-09-21 LAB — BASIC METABOLIC PANEL
Anion gap: 8 (ref 5–15)
BUN: 13 mg/dL (ref 6–20)
CO2: 20 mmol/L — ABNORMAL LOW (ref 22–32)
Calcium: 9.1 mg/dL (ref 8.9–10.3)
Chloride: 110 mmol/L (ref 101–111)
Creatinine, Ser: 1.17 mg/dL (ref 0.61–1.24)
GFR calc Af Amer: >60 mL/min (ref >60)
GFR calc non Af Amer: 57 mL/min — ABNORMAL LOW (ref >60)
Glucose, Bld: 153 mg/dL — ABNORMAL HIGH (ref 65–99)
Potassium: 3.7 mmol/L (ref 3.5–5.1)
Sodium: 138 mmol/L (ref 135–145)

## 2016-09-21 LAB — CBC
HCT: 43.1 % (ref 39.0–52.0)
Hemoglobin: 15 g/dL (ref 13.0–17.0)
MCH: 31.8 pg (ref 26.0–34.0)
MCHC: 34.8 g/dL (ref 30.0–36.0)
MCV: 91.5 fL (ref 78.0–100.0)
Platelets: 148 10*3/uL — ABNORMAL LOW (ref 150–400)
RBC: 4.71 MIL/uL (ref 4.22–5.81)
RDW: 13.6 % (ref 11.5–15.5)
WBC: 14.9 10*3/uL — ABNORMAL HIGH (ref 4.0–10.5)

## 2016-09-21 LAB — BMP8+EGFR
BUN/Creatinine Ratio: 12 (ref 10–24)
BUN: 14 mg/dL (ref 8–27)
CO2: 27 mmol/L (ref 18–29)
Calcium: 8.7 mg/dL (ref 8.6–10.2)
Chloride: 105 mmol/L (ref 96–106)
Creatinine, Ser: 1.15 mg/dL (ref 0.76–1.27)
GFR calc Af Amer: 69 mL/min/{1.73_m2} (ref >59)
GFR calc non Af Amer: 60 mL/min/{1.73_m2} (ref >59)
Glucose: 103 mg/dL — ABNORMAL HIGH (ref 65–99)
Potassium: 3.9 mmol/L (ref 3.5–5.2)
Sodium: 144 mmol/L (ref 134–144)

## 2016-09-21 LAB — I-STAT TROPONIN, ED: Troponin i, poc: 0 ng/mL (ref 0.00–0.08)

## 2016-09-21 LAB — VITAMIN D 25 HYDROXY (VIT D DEFICIENCY, FRACTURES): Vit D, 25-Hydroxy: 16.2 ng/mL — ABNORMAL LOW (ref 30.0–100.0)

## 2016-09-21 NOTE — ED Notes (Signed)
Got patient undressed into a gown on the monitor did ekg shown to Dr French Ana
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
## 2016-09-21 NOTE — Telephone Encounter (Signed)
Received call from patient who reported symptoms of light-headedness, dizziness, SOB, fluttering, rapid heart rate. He tried to check his BP and HR with his home machine and it said  error. Patient stated he took 0.5 mg Ativan last night on 4/9 and this morning he took 0.5 mg Ativan and 50 mg Atenolol which did not help. Patient is currently wearing a monitor as prescribed by Dr. Harrington Challenger. Patient sounded very anxious on the phone and states he is very light headed. He was advised to call 911 due to not feeling like patient could safely come in for an office visit. Patient again attempted to take his BP and again got error message. Patient advised to hang up and call 911 and he verbalized in agreement.

## 2016-09-21 NOTE — ED Notes (Signed)
ED Provider at bedside. 

## 2016-09-21 NOTE — Telephone Encounter (Signed)
New message     Rapid heart rate  Patient c/o Palpitations:  High priority if patient c/o lightheadedness and shortness of breath.  1. How long have you been having palpitations? Since last night   2. Are you currently experiencing lightheadedness and shortness of breath? Yes   3. Have you checked your BP and heart rate? (document readings) doesn't know   4. Are you experiencing any other symptoms? Rapid heart rate, sob,  Lightheadedness

## 2016-09-21 NOTE — ED Triage Notes (Signed)
Pt from home via [facility] with c/o heart palpitations and feeling lightheaded for the last 12 hours.  Pt reports a hx of the same approx 6 weeks ago.  Wearing a heart monitor.  NAD, A&O.

## 2016-09-21 NOTE — Discharge Instructions (Signed)
We have done several blood tests to evaluate for any damage to your heart or other causes for your palpitations. Your white-blood cell count is elevated which can be a sign of infection and is likely related to your sinus infection. This may be related to your symptoms of palpitations. You should continue to take your antibiotic which was previously prescribed to you and follow up with your Cardiologist as soon as possible.

## 2016-09-24 ENCOUNTER — Ambulatory Visit (INDEPENDENT_AMBULATORY_CARE_PROVIDER_SITE_OTHER): Payer: PPO | Admitting: Internal Medicine

## 2016-09-24 ENCOUNTER — Encounter: Payer: Self-pay | Admitting: Internal Medicine

## 2016-09-24 VITALS — BP 124/80 | HR 58 | Ht 71.0 in | Wt 199.8 lb

## 2016-09-24 DIAGNOSIS — R002 Palpitations: Secondary | ICD-10-CM

## 2016-09-24 DIAGNOSIS — R0789 Other chest pain: Secondary | ICD-10-CM

## 2016-09-24 DIAGNOSIS — E782 Mixed hyperlipidemia: Secondary | ICD-10-CM
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
## 2016-09-24 NOTE — Patient Instructions (Signed)
Your physician recommends that you continue on your current medications as directed. Please refer to the Current Medication list given to you today.  Your physician recommends that you schedule a follow-up appointment in: 10-12 weeks with Dr. Harrington Challenger.

## 2016-09-28 DIAGNOSIS — H6121 Impacted cerumen, right ear: Secondary | ICD-10-CM | POA: Diagnosis not present

## 2016-09-28 DIAGNOSIS — J310 Chronic rhinitis: Secondary | ICD-10-CM | POA: Diagnosis not present

## 2016-09-28 DIAGNOSIS — R42 Dizziness and giddiness: Secondary | ICD-10-CM | POA: Diagnosis not present

## 2016-10-06 ENCOUNTER — Other Ambulatory Visit: Payer: Self-pay | Admitting: *Deleted

## 2016-10-06 MED ORDER — ATENOLOL 50 MG PO TABS: 50.0000 mg | ORAL_TABLET | Freq: Every day | ORAL | 1 refills | Status: DC

## 2016-10-15 ENCOUNTER — Ambulatory Visit: Payer: PPO | Admitting: Internal Medicine

## 2016-10-30 ENCOUNTER — Telehealth: Payer: Self-pay | Admitting: Family Medicine

## 2016-11-01 NOTE — Telephone Encounter (Signed)
I prefer that he wait until 6 weeks after any acute episode.

## 2016-11-02 NOTE — Telephone Encounter (Signed)
Patient informed of Dr. Livia Snellen recommendation.

## 2016-11-16 ENCOUNTER — Ambulatory Visit (INDEPENDENT_AMBULATORY_CARE_PROVIDER_SITE_OTHER): Payer: PPO | Admitting: Family Medicine

## 2016-11-16 ENCOUNTER — Encounter: Payer: Self-pay | Admitting: Family Medicine

## 2016-11-16 VITALS — BP 139/74 | HR 57 | Temp 97.0°F | Ht 71.0 in | Wt 204.0 lb

## 2016-11-16 DIAGNOSIS — E559 Vitamin D deficiency, unspecified: Secondary | ICD-10-CM

## 2016-11-16 DIAGNOSIS — B0229 Other postherpetic nervous system involvement: Secondary | ICD-10-CM | POA: Diagnosis not present

## 2016-11-16 DIAGNOSIS — I10 Essential (primary) hypertension: Secondary | ICD-10-CM

## 2016-11-16 DIAGNOSIS — M7989 Other specified soft tissue disorders: Secondary | ICD-10-CM | POA: Diagnosis not present

## 2016-11-16 DIAGNOSIS — E876 Hypokalemia: Secondary | ICD-10-CM | POA: Diagnosis not present

## 2016-11-16 MED ORDER — VITAMIN D3 125 MCG (5000 UT) PO CAPS: 5000.0000 [IU] | ORAL_CAPSULE | Freq: Every day | ORAL | 11 refills | Status: DC

## 2016-11-16 NOTE — Progress Notes (Signed)
Subjective:  Patient ID: Ronald Russell, male    DOB: 04/30/1935  Age: 82 y.o. MRN: [id number]  CC: Edema (pt here today c/o right leg swelling and also wants to discuss the cyst on his back. He would also like to have his Vitamin D and potassium check.)   HPI Ronald Russell presents for Review of the cyst. It is between the shoulder blades. It is not giving him any problems whatsoever. He is concerned about whether it should be removed. Additionally he's had some prickly sensation in his right thumb from shingles. This seems to go to line up the forearm into the upper arm. It is noticeable perhaps slightly annoying but mild and not disabling in any way. Of note is since increasing the atenolol is palpitations have resolved. He is concerned about vitamin D deficiency she could not tolerate the  50,000 units so he's gotten some over-the-counter that he says is 550 mg and he is taking that daily. Additionally he was noted to have a borderline potassium level at last check and would like to have that rechecked. He denies any notable leg cramps and the palpitations have actually resolved. etc. .  History Ronald Russell has a past medical history of Allergy; Anxiety; Bladder cancer (Ronald Russell) (2010); Cataract; Chronic bronchitis (Ronald Russell); Clotting disorder (Ronald Russell); Exertional shortness of breath; GERD (gastroesophageal reflux disease); History of blood transfusion (1949); HOH (hard of hearing); Hypertension; Ocular migraine; Pneumonia; PONV (postoperative nausea and vomiting); Seasonal allergies; Shingles; and Temporal arteritis (Ronald Russell).   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   His family history includes Alzheimer's disease in his father; Cancer in his mother.He reports that he has  quit smoking. His smoking use included Cigarettes. He has a 2.25 pack-year smoking history. He has never used smokeless tobacco. He reports that he does not drink alcohol or use drugs.    ROS Review of Systems  Constitutional: Negative for chills, diaphoresis, fever and unexpected weight change.  HENT: Negative for congestion, hearing loss, rhinorrhea and sore throat.   Eyes: Negative for visual disturbance.  Respiratory: Negative for cough and shortness of breath.   Cardiovascular: Negative for chest pain.  Gastrointestinal: Negative for abdominal pain, constipation and diarrhea.  Genitourinary: Negative for dysuria and flank pain.  Musculoskeletal: Negative for arthralgias and joint swelling.  Skin: Negative for rash.  Neurological: Negative for dizziness and headaches.  Psychiatric/Behavioral: Negative for dysphoric mood and sleep disturbance.    Objective:  BP 139/74   Pulse (!) 57   Temp 97 F (36.1 C) (Oral)   Ht _0  (1.803 m)   Wt 204 lb (92.5 kg)   BMI 28.45 kg/m   BP Readings from Last 3 Encounters:  11/16/16 139/74  09/24/16 124/80  09/21/16 120/79    Wt Readings from Last 3 Encounters:  11/16/16 204 lb (92.5 kg)  09/24/16 199 lb 12.8 oz (90.6 kg)  09/20/16 200 lb (90.7 kg)     Physical Exam  Constitutional: He is oriented to person, place, and time. He appears well-developed and well-nourished. No distress.  HENT:  Head: Normocephalic and atraumatic.  Right Ear: External ear normal.  Left Ear: External ear normal.  Nose: Nose normal.  Mouth/Throat: Oropharynx is clear and moist.  Eyes: Conjunctivae and EOM are normal. Pupils are equal, round, and reactive to light.  Neck: Normal range of motion. Neck supple. No thyromegaly present.  Cardiovascular: Normal rate, regular rhythm and normal heart sounds.   No murmur heard. Pulmonary/Chest: Effort normal and breath sounds normal. No respiratory distress. He has no wheezes. He has no rales.  Abdominal:  Soft. Bowel sounds are normal. He exhibits no distension. There is no tenderness.  Musculoskeletal: He exhibits edema (trace to 1+ of the feet and ankles).  Lymphadenopathy:    He has no cervical adenopathy.  Neurological: He is alert and oriented to person, place, and time. He has normal reflexes.  Skin: Skin is warm and dry.  2 cm raised sebaceous cyst between shoulder blades. It is skin tone. There is no sign of inflammation. It is nontender.  Psychiatric: He has a normal mood and affect. His behavior is normal. Judgment and thought content normal.      Assessment & Plan:   Ronald Russell was seen today for edema.  Diagnoses and all orders for this visit:  Essential hypertension  Post herpetic neuralgia  Vitamin D deficiency  Hypokalemia -     BMP8+EGFR  Localized swelling of lower extremity -     BMP8+EGFR  Other orders -     Cholecalciferol (VITAMIN D3) 5000 units CAPS; Take 1 capsule (5,000 Units total) by mouth daily.   After discussion the patient agrees that the sebaceous cyst should be left alone.    I have discontinued [name] aspirin EC and (VITAMIN D, CHOLECALCIFEROL, PO). I am also having him start on Vitamin D3. Additionally, I am having him maintain his fexofenadine, colchicine, omeprazole, febuxostat, pravastatin, Potassium Aminobenzoate, LORazepam, and atenolol.  Allergies as of 11/16/2016      Reactions   Augmentin [amoxicillin-pot Clavulanate] Diarrhea   Ciprofloxacin Nausea Only   Codeine Other (See Comments)   Severe headache, nausea and vomiting   Oxycodone Nausea And Vomiting   Prednisone Other (See Comments)   hyperactivity   Vicodin [hydrocodone-acetaminophen] Nausea And Vomiting      Medication List       Accurate as of 11/16/16 11:59 PM. Always use your most recent med list.          atenolol 50 MG tablet Commonly known as:  TENORMIN Take 1 tablet (50 mg total) by mouth daily.   colchicine 0.6 MG tablet Take 1 tablet (0.6 mg total)  by mouth 2 (two) times daily.   febuxostat 40 MG tablet Commonly known as:  ULORIC Take 1 tablet (40 mg total) by mouth daily. To prevent gout   fexofenadine 180 MG tablet Commonly known as:  ALLEGRA Take 180 mg by mouth every morning.   LORazepam 0.5 MG tablet Commonly known as:  ATIVAN TAKE 1/2 TO 1 TABLET EVERY 8 HOURS AS NEEDED FOR ANXIETY/SLEEP   omeprazole 40 MG capsule Commonly known as:  PRILOSEC TAKE 1 CAPSULE (40 MG TOTAL) BY MOUTH 2 (TWO) TIMES DAILY.   Potassium Aminobenzoate 500 MG Caps Take 1 capsule by mouth every other day.   pravastatin 40 MG tablet Commonly known as:  PRAVACHOL Take 40 mg by mouth daily.   Vitamin D3 5000 units Caps Take 1 capsule (5,000 Units total) by mouth daily.        Follow-up: Return in about 3 months (around 02/16/2017).  Claretta Fraise, M.D.

## 2016-11-17 LAB — BMP8+EGFR
BUN/Creatinine Ratio: 12 (ref 10–24)
BUN: 16 mg/dL (ref 8–27)
CO2: 24 mmol/L (ref 18–29)
Calcium: 9 mg/dL (ref 8.6–10.2)
Chloride: 105 mmol/L (ref 96–106)
Creatinine, Ser: 1.31 mg/dL — ABNORMAL HIGH (ref 0.76–1.27)
GFR calc Af Amer: 59 mL/min/{1.73_m2} — ABNORMAL LOW (ref >59)
GFR calc non Af Amer: 51 mL/min/{1.73_m2} — ABNORMAL LOW (ref >59)
Glucose: 86 mg/dL (ref 65–99)
Potassium: 4.3 mmol/L (ref 3.5–5.2)
Sodium: 140 mmol/L (ref 134–144)

## 2016-11-29 ENCOUNTER — Emergency Department (HOSPITAL_COMMUNITY): Payer: PPO

## 2016-11-29 ENCOUNTER — Encounter (HOSPITAL_COMMUNITY): Payer: Self-pay | Admitting: Emergency Medicine

## 2016-11-29 ENCOUNTER — Inpatient Hospital Stay (HOSPITAL_COMMUNITY)
Admission: EM | Admit: 2016-11-29 | Discharge: 2016-12-06 | DRG: 418 | Disposition: A | Discharge status: Skilled Nursing Facility | Payer: PPO | Attending: Internal Medicine | Admitting: Internal Medicine

## 2016-11-29 ENCOUNTER — Telehealth: Payer: Self-pay | Admitting: Family Medicine

## 2016-11-29 DIAGNOSIS — J42 Unspecified chronic bronchitis: Secondary | ICD-10-CM | POA: Diagnosis not present

## 2016-11-29 DIAGNOSIS — R1110 Vomiting, unspecified: Secondary | ICD-10-CM | POA: Diagnosis not present

## 2016-11-29 DIAGNOSIS — F411 Generalized anxiety disorder: Secondary | ICD-10-CM | POA: Diagnosis not present

## 2016-11-29 DIAGNOSIS — J90 Pleural effusion, not elsewhere classified: Secondary | ICD-10-CM

## 2016-11-29 DIAGNOSIS — D696 Thrombocytopenia, unspecified: Secondary | ICD-10-CM | POA: Diagnosis present

## 2016-11-29 DIAGNOSIS — K810 Acute cholecystitis: Secondary | ICD-10-CM | POA: Diagnosis not present

## 2016-11-29 DIAGNOSIS — K219 Gastro-esophageal reflux disease without esophagitis: Secondary | ICD-10-CM | POA: Diagnosis not present

## 2016-11-29 DIAGNOSIS — R739 Hyperglycemia, unspecified: Secondary | ICD-10-CM | POA: Diagnosis not present

## 2016-11-29 DIAGNOSIS — Z9189 Other specified personal risk factors, not elsewhere classified: Secondary | ICD-10-CM | POA: Diagnosis not present

## 2016-11-29 DIAGNOSIS — I129 Hypertensive chronic kidney disease with stage 1 through stage 4 chronic kidney disease, or unspecified chronic kidney disease: Secondary | ICD-10-CM | POA: Diagnosis present

## 2016-11-29 DIAGNOSIS — E785 Hyperlipidemia, unspecified: Secondary | ICD-10-CM | POA: Diagnosis not present

## 2016-11-29 DIAGNOSIS — R1013 Epigastric pain: Secondary | ICD-10-CM | POA: Diagnosis not present

## 2016-11-29 DIAGNOSIS — Z888 Allergy status to other drugs, medicaments and biological substances status: Secondary | ICD-10-CM

## 2016-11-29 DIAGNOSIS — N289 Disorder of kidney and ureter, unspecified: Secondary | ICD-10-CM | POA: Diagnosis not present

## 2016-11-29 DIAGNOSIS — R10812 Left upper quadrant abdominal tenderness: Secondary | ICD-10-CM | POA: Diagnosis not present

## 2016-11-29 DIAGNOSIS — K8000 Calculus of gallbladder with acute cholecystitis without obstruction: Principal | ICD-10-CM | POA: Diagnosis present

## 2016-11-29 DIAGNOSIS — R262 Difficulty in walking, not elsewhere classified: Secondary | ICD-10-CM | POA: Diagnosis not present

## 2016-11-29 DIAGNOSIS — Z881 Allergy status to other antibiotic agents status: Secondary | ICD-10-CM | POA: Diagnosis not present

## 2016-11-29 DIAGNOSIS — K819 Cholecystitis, unspecified: Secondary | ICD-10-CM | POA: Diagnosis not present

## 2016-11-29 DIAGNOSIS — T501X5A Adverse effect of loop [high-ceiling] diuretics, initial encounter: Secondary | ICD-10-CM | POA: Diagnosis not present

## 2016-11-29 DIAGNOSIS — K210 Gastro-esophageal reflux disease with esophagitis: Secondary | ICD-10-CM | POA: Diagnosis present

## 2016-11-29 DIAGNOSIS — Z885 Allergy status to narcotic agent status: Secondary | ICD-10-CM | POA: Diagnosis not present

## 2016-11-29 DIAGNOSIS — N183 Chronic kidney disease, stage 3 (moderate): Secondary | ICD-10-CM | POA: Diagnosis present

## 2016-11-29 DIAGNOSIS — Z8551 Personal history of malignant neoplasm of bladder: Secondary | ICD-10-CM | POA: Diagnosis not present

## 2016-11-29 DIAGNOSIS — I10 Essential (primary) hypertension: Secondary | ICD-10-CM | POA: Diagnosis not present

## 2016-11-29 DIAGNOSIS — Z8619 Personal history of other infectious and parasitic diseases: Secondary | ICD-10-CM

## 2016-11-29 DIAGNOSIS — H9190 Unspecified hearing loss, unspecified ear: Secondary | ICD-10-CM | POA: Diagnosis not present

## 2016-11-29 DIAGNOSIS — E876 Hypokalemia: Secondary | ICD-10-CM | POA: Diagnosis present

## 2016-11-29 DIAGNOSIS — K649 Unspecified hemorrhoids: Secondary | ICD-10-CM | POA: Diagnosis present

## 2016-11-29 DIAGNOSIS — Z48815 Encounter for surgical aftercare following surgery on the digestive system: Secondary | ICD-10-CM | POA: Diagnosis not present

## 2016-11-29 DIAGNOSIS — K8010 Calculus of gallbladder with chronic cholecystitis without obstruction: Secondary | ICD-10-CM | POA: Diagnosis not present

## 2016-11-29 DIAGNOSIS — M1000 Idiopathic gout, unspecified site: Secondary | ICD-10-CM | POA: Diagnosis not present

## 2016-11-29 DIAGNOSIS — B0229 Other postherpetic nervous system involvement: Secondary | ICD-10-CM | POA: Diagnosis not present

## 2016-11-29 DIAGNOSIS — R002 Palpitations: Secondary | ICD-10-CM | POA: Diagnosis not present

## 2016-11-29 DIAGNOSIS — M316 Other giant cell arteritis: Secondary | ICD-10-CM | POA: Diagnosis not present

## 2016-11-29 DIAGNOSIS — H8110 Benign paroxysmal vertigo, unspecified ear: Secondary | ICD-10-CM | POA: Diagnosis not present

## 2016-11-29 DIAGNOSIS — E559 Vitamin D deficiency, unspecified: Secondary | ICD-10-CM | POA: Diagnosis not present

## 2016-11-29 DIAGNOSIS — Z862 Personal history of diseases of the blood and blood-forming organs and certain disorders involving the immune mechanism: Secondary | ICD-10-CM

## 2016-11-29 DIAGNOSIS — R112 Nausea with vomiting, unspecified: Secondary | ICD-10-CM | POA: Diagnosis not present

## 2016-11-29 DIAGNOSIS — Z87891 Personal history of nicotine dependence: Secondary | ICD-10-CM | POA: Diagnosis not present

## 2016-11-29 DIAGNOSIS — R279 Unspecified lack of coordination: Secondary | ICD-10-CM | POA: Diagnosis not present

## 2016-11-29 DIAGNOSIS — M6281 Muscle weakness (generalized): Secondary | ICD-10-CM | POA: Diagnosis not present

## 2016-11-29 DIAGNOSIS — D849 Immunodeficiency, unspecified: Secondary | ICD-10-CM | POA: Diagnosis not present

## 2016-11-29 DIAGNOSIS — K2970 Gastritis, unspecified, without bleeding: Secondary | ICD-10-CM | POA: Diagnosis not present

## 2016-11-29 DIAGNOSIS — R0902 Hypoxemia: Secondary | ICD-10-CM | POA: Diagnosis not present

## 2016-11-29 DIAGNOSIS — Z79899 Other long term (current) drug therapy: Secondary | ICD-10-CM

## 2016-11-29 HISTORY — DX: Thrombocytopenia, unspecified: D69.6

## 2016-11-29 LAB — COMPREHENSIVE METABOLIC PANEL
ALT: 17 U/L (ref 17–63)
AST: 18 U/L (ref 15–41)
Albumin: 3.8 g/dL (ref 3.5–5.0)
Alkaline Phosphatase: 47 U/L (ref 38–126)
Anion gap: 8 (ref 5–15)
BUN: 15 mg/dL (ref 6–20)
CO2: 24 mmol/L (ref 22–32)
Calcium: 8.5 mg/dL — ABNORMAL LOW (ref 8.9–10.3)
Chloride: 106 mmol/L (ref 101–111)
Creatinine, Ser: 1.29 mg/dL — ABNORMAL HIGH (ref 0.61–1.24)
GFR calc Af Amer: 59 mL/min — ABNORMAL LOW (ref >60)
GFR calc non Af Amer: 51 mL/min — ABNORMAL LOW (ref >60)
Glucose, Bld: 119 mg/dL — ABNORMAL HIGH (ref 65–99)
Potassium: 3.9 mmol/L (ref 3.5–5.1)
Sodium: 138 mmol/L (ref 135–145)
Total Bilirubin: 1.3 mg/dL — ABNORMAL HIGH (ref 0.3–1.2)
Total Protein: 6.4 g/dL — ABNORMAL LOW (ref 6.5–8.1)

## 2016-11-29 LAB — CBC
HCT: 42 % (ref 39.0–52.0)
Hemoglobin: 14.2 g/dL (ref 13.0–17.0)
MCH: 30.9 pg (ref 26.0–34.0)
MCHC: 33.8 g/dL (ref 30.0–36.0)
MCV: 91.3 fL (ref 78.0–100.0)
Platelets: 111 10*3/uL — ABNORMAL LOW (ref 150–400)
RBC: 4.6 MIL/uL (ref 4.22–5.81)
RDW: 14.1 % (ref 11.5–15.5)
WBC: 10.1 10*3/uL (ref 4.0–10.5)

## 2016-11-29 LAB — LIPASE, BLOOD: Lipase: 24 U/L (ref 11–51)

## 2016-11-29 MED ORDER — IOPAMIDOL (ISOVUE-300) INJECTION 61%
100.0000 mL | Freq: Once | INTRAVENOUS | Status: AC | PRN
  Administered 2016-11-29: 100 mL via INTRAVENOUS

## 2016-11-29 MED ORDER — COLCHICINE 0.6 MG PO TABS
0.6000 mg | ORAL_TABLET | Freq: Two times a day (BID) | ORAL | Status: DC
  Administered 2016-11-29 – 2016-12-06 (×13): 0.6 mg via ORAL
  Filled 2016-11-29 (×13): qty 1

## 2016-11-29 MED ORDER — PIPERACILLIN-TAZOBACTAM 3.375 G IVPB 30 MIN
3.3750 g | Freq: Once | INTRAVENOUS | Status: AC
  Administered 2016-11-29: 3.375 g via INTRAVENOUS
  Filled 2016-11-29: qty 50

## 2016-11-29 MED ORDER — FEBUXOSTAT 40 MG PO TABS
40.0000 mg | ORAL_TABLET | Freq: Every day | ORAL | Status: DC
  Administered 2016-11-29 – 2016-12-06 (×7): 40 mg via ORAL
  Filled 2016-11-29 (×7): qty 1

## 2016-11-29 MED ORDER — PIPERACILLIN-TAZOBACTAM 3.375 G IVPB
3.3750 g | Freq: Three times a day (TID) | INTRAVENOUS | Status: DC
  Administered 2016-11-30 – 2016-12-06 (×20): 3.375 g via INTRAVENOUS
  Filled 2016-11-29 (×20): qty 50

## 2016-11-29 MED ORDER — PRAVASTATIN SODIUM 40 MG PO TABS
40.0000 mg | ORAL_TABLET | Freq: Every day | ORAL | Status: DC
  Administered 2016-11-29 – 2016-12-05 (×6): 40 mg via ORAL
  Filled 2016-11-29 (×6): qty 1

## 2016-11-29 MED ORDER — SODIUM CHLORIDE 0.9 % IV BOLUS (SEPSIS)
500.0000 mL | Freq: Once | INTRAVENOUS | Status: AC
  Administered 2016-11-29: 500 mL via INTRAVENOUS

## 2016-11-29 MED ORDER — ACETAMINOPHEN 325 MG PO TABS: 650.0000 mg | ORAL_TABLET | Freq: Four times a day (QID) | ORAL | Status: DC | PRN

## 2016-11-29 MED ORDER — LORATADINE 10 MG PO TABS
10.0000 mg | ORAL_TABLET | Freq: Every day | ORAL | Status: DC
  Administered 2016-11-29 – 2016-12-06 (×7): 10 mg via ORAL
  Filled 2016-11-29 (×7): qty 1

## 2016-11-29 MED ORDER — ONDANSETRON HCL 4 MG/2ML IJ SOLN
4.0000 mg | Freq: Once | INTRAMUSCULAR | Status: AC
  Administered 2016-11-29: 4 mg via INTRAVENOUS
  Filled 2016-11-29: qty 2

## 2016-11-29 MED ORDER — FENTANYL CITRATE (PF) 100 MCG/2ML IJ SOLN
25.0000 ug | INTRAMUSCULAR | Status: DC | PRN
  Administered 2016-11-29 – 2016-11-30 (×6): 25 ug via INTRAVENOUS
  Filled 2016-11-29 (×6): qty 2

## 2016-11-29 MED ORDER — ONDANSETRON HCL 4 MG/2ML IJ SOLN
INTRAMUSCULAR | Status: AC
  Filled 2016-11-29: qty 2

## 2016-11-29 MED ORDER — LORAZEPAM 2 MG/ML IJ SOLN
1.0000 mg | Freq: Four times a day (QID) | INTRAMUSCULAR | Status: DC | PRN
  Administered 2016-11-30 – 2016-12-01 (×2): 1 mg via INTRAVENOUS
  Filled 2016-11-29 (×2): qty 1

## 2016-11-29 MED ORDER — ACETAMINOPHEN 650 MG RE SUPP: 650.0000 mg | Freq: Four times a day (QID) | RECTAL | Status: DC | PRN

## 2016-11-29 MED ORDER — FENTANYL CITRATE (PF) 100 MCG/2ML IJ SOLN
50.0000 ug | Freq: Once | INTRAMUSCULAR | Status: AC
  Administered 2016-11-29: 50 ug via INTRAVENOUS
  Filled 2016-11-29: qty 2

## 2016-11-29 MED ORDER — PANTOPRAZOLE SODIUM 40 MG PO TBEC
40.0000 mg | DELAYED_RELEASE_TABLET | Freq: Every day | ORAL | Status: DC
  Administered 2016-12-01 – 2016-12-06 (×6): 40 mg via ORAL
  Filled 2016-11-29 (×6): qty 1

## 2016-11-29 MED ORDER — HYDROMORPHONE HCL 1 MG/ML IJ SOLN
1.0000 mg | Freq: Once | INTRAMUSCULAR | Status: AC
  Administered 2016-11-29: 1 mg via INTRAVENOUS
  Filled 2016-11-29: qty 1

## 2016-11-29 MED ORDER — ATENOLOL 25 MG PO TABS
50.0000 mg | ORAL_TABLET | Freq: Every day | ORAL | Status: DC
  Administered 2016-11-29 – 2016-12-05 (×6): 50 mg via ORAL
  Filled 2016-11-29 (×7): qty 2

## 2016-11-29 MED ORDER — LORAZEPAM 2 MG/ML IJ SOLN
1.0000 mg | Freq: Four times a day (QID) | INTRAMUSCULAR | Status: DC
  Administered 2016-11-29 – 2016-12-02 (×8): 1 mg via INTRAVENOUS
  Filled 2016-11-29 (×10): qty 1

## 2016-11-29 MED ORDER — ONDANSETRON HCL 4 MG/2ML IJ SOLN
4.0000 mg | Freq: Four times a day (QID) | INTRAMUSCULAR | Status: DC | PRN
  Administered 2016-11-29 – 2016-12-05 (×6): 4 mg via INTRAVENOUS
  Filled 2016-11-29 (×7): qty 2

## 2016-11-29 NOTE — Progress Notes (Signed)
Pharmacy Antibiotic Note  Ronald Russell is a 82 y.o. male admitted on 11/29/2016 with Intra-abdominal Infection.  Pharmacy has been consulted for Zosyn dosing.  Augmentin intolerance noted (diarrhea) received Zosyn in ED.  Plan: Zosyn 3.375g IV q8h (4 hour infusion). Monitor labs, micro and vitals.   Height: 6' (182.9 cm) Weight: 197 lb 1.5 oz (89.4 kg) IBW/kg (Calculated) : 77.6  Temp (24hrs), Avg:98.2 F (36.8 C), Min:98 F (36.7 C), Max:98.4 F (36.9 C)   Recent Labs Lab 11/29/16 1508  WBC 10.1  CREATININE 1.29*    Estimated Creatinine Clearance: 50.1 mL/min (A) (by C-G formula based on SCr of 1.29 mg/dL (H)).    Allergies  Allergen Reactions  . Augmentin [Amoxicillin-Pot Clavulanate] Diarrhea  . Ciprofloxacin Nausea Only  . Codeine Other (See Comments)    Severe headache, nausea and vomiting  . Oxycodone Nausea And Vomiting  . Prednisone Other (See Comments)    hyperactivity  . Vicodin [Hydrocodone-Acetaminophen] Nausea And Vomiting    Antimicrobials this admission: Zosyn 6/18 >>   Dose adjustments this admission: n/a   Microbiology results: BCx:   UCx:    Sputum:    MRSA PCR:   Thank you for allowing pharmacy to be a part of this patient's care.  Pricilla Larsson 11/29/2016 9:08 PM

## 2016-11-29 NOTE — Telephone Encounter (Signed)
What symptoms do you have? excrutiating stomach pain under rib cage  How long have you been sick? About lunch time sat.  Have you been seen for this problem? No he has taken 4 rolaids and it is not helping  If your provider decides to give you a prescription, which pharmacy would you like for it to be sent to? [facility]   Patient informed that this information will be sent to the clinical staff for review and that they should receive a follow up call.

## 2016-11-29 NOTE — ED Triage Notes (Signed)
abd pain in luq.  N/V since this morning.  Given 4mg  zofran and 551ml ns by ems
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
## 2016-11-29 NOTE — Telephone Encounter (Signed)
Pt called EMS they are en route to pt's home He ate tenderloin biscuit & gravy yesterday morning and has been hurting on left side under ribs since.
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
## 2016-11-30 ENCOUNTER — Inpatient Hospital Stay (HOSPITAL_COMMUNITY): Payer: PPO | Admitting: Anesthesiology

## 2016-11-30 ENCOUNTER — Encounter (HOSPITAL_COMMUNITY): Payer: Self-pay | Admitting: *Deleted

## 2016-11-30 ENCOUNTER — Encounter (HOSPITAL_COMMUNITY)
Admission: EM | Disposition: A | Discharge status: Skilled Nursing Facility | Payer: Self-pay | Source: Home / Self Care | Attending: Internal Medicine

## 2016-11-30 DIAGNOSIS — N183 Chronic kidney disease, stage 3 (moderate): Secondary | ICD-10-CM

## 2016-11-30 DIAGNOSIS — F411 Generalized anxiety disorder: Secondary | ICD-10-CM

## 2016-11-30 DIAGNOSIS — Z9089 Acquired absence of other organs: Secondary | ICD-10-CM | POA: Insufficient documentation

## 2016-11-30 HISTORY — PX: CHOLECYSTECTOMY: SHX55

## 2016-11-30 LAB — CBC
HCT: 42 % (ref 39.0–52.0)
Hemoglobin: 14.2 g/dL (ref 13.0–17.0)
MCH: 30.9 pg (ref 26.0–34.0)
MCHC: 33.8 g/dL (ref 30.0–36.0)
MCV: 91.5 fL (ref 78.0–100.0)
Platelets: 113 10*3/uL — ABNORMAL LOW (ref 150–400)
RBC: 4.59 MIL/uL (ref 4.22–5.81)
RDW: 14.2 % (ref 11.5–15.5)
WBC: 13 10*3/uL — ABNORMAL HIGH (ref 4.0–10.5)

## 2016-11-30 LAB — BASIC METABOLIC PANEL
Anion gap: 10 (ref 5–15)
BUN: 14 mg/dL (ref 6–20)
CO2: 23 mmol/L (ref 22–32)
Calcium: 8.3 mg/dL — ABNORMAL LOW (ref 8.9–10.3)
Chloride: 103 mmol/L (ref 101–111)
Creatinine, Ser: 1.21 mg/dL (ref 0.61–1.24)
GFR calc Af Amer: >60 mL/min (ref >60)
GFR calc non Af Amer: 55 mL/min — ABNORMAL LOW (ref >60)
Glucose, Bld: 131 mg/dL — ABNORMAL HIGH (ref 65–99)
Potassium: 3.6 mmol/L (ref 3.5–5.1)
Sodium: 136 mmol/L (ref 135–145)

## 2016-11-30 LAB — SURGICAL PCR SCREEN
MRSA, PCR: NEGATIVE
Staphylococcus aureus: POSITIVE — AB

## 2016-11-30 LAB — GLUCOSE, CAPILLARY
Glucose-Capillary: 107 mg/dL — ABNORMAL HIGH (ref 65–99)
Glucose-Capillary: 101 mg/dL — ABNORMAL HIGH (ref 65–99)
Glucose-Capillary: 115 mg/dL — ABNORMAL HIGH (ref 65–99)

## 2016-11-30 SURGERY — LAPAROSCOPIC CHOLECYSTECTOMY
Anesthesia: General

## 2016-11-30 MED ORDER — TRAMADOL HCL 50 MG PO TABS
50.0000 mg | ORAL_TABLET | Freq: Four times a day (QID) | ORAL | Status: DC | PRN
  Administered 2016-12-02 – 2016-12-03 (×2): 50 mg via ORAL
  Filled 2016-11-30 (×2): qty 1

## 2016-11-30 MED ORDER — KETOROLAC TROMETHAMINE 30 MG/ML IJ SOLN
30.0000 mg | Freq: Once | INTRAMUSCULAR | Status: AC
  Administered 2016-11-30: 30 mg via INTRAVENOUS
  Filled 2016-11-30: qty 1

## 2016-11-30 MED ORDER — DEXAMETHASONE SODIUM PHOSPHATE 4 MG/ML IJ SOLN
INTRAMUSCULAR | Status: AC
  Filled 2016-11-30: qty 1

## 2016-11-30 MED ORDER — CHLORHEXIDINE GLUCONATE CLOTH 2 % EX PADS
6.0000 | MEDICATED_PAD | Freq: Once | CUTANEOUS | Status: AC
  Administered 2016-11-30: 6 via TOPICAL

## 2016-11-30 MED ORDER — BUPIVACAINE HCL (PF) 0.5 % IJ SOLN
INTRAMUSCULAR | Status: AC
  Filled 2016-11-30: qty 30

## 2016-11-30 MED ORDER — ROCURONIUM 10MG/ML (10ML) SYRINGE FOR MEDFUSION PUMP - OPTIME
INTRAVENOUS | Status: DC | PRN
  Administered 2016-11-30: 25 mg via INTRAVENOUS
  Administered 2016-11-30: 5 mg via INTRAVENOUS

## 2016-11-30 MED ORDER — MORPHINE SULFATE (PF) 2 MG/ML IV SOLN
2.0000 mg | INTRAVENOUS | Status: DC | PRN
  Administered 2016-12-01 (×3): 2 mg via INTRAVENOUS
  Filled 2016-11-30 (×3): qty 1

## 2016-11-30 MED ORDER — LACTATED RINGERS IV SOLN
INTRAVENOUS | Status: DC
  Administered 2016-11-30 – 2016-12-01 (×3): via INTRAVENOUS

## 2016-11-30 MED ORDER — ONDANSETRON HCL 4 MG/2ML IJ SOLN
INTRAMUSCULAR | Status: AC
  Filled 2016-11-30: qty 2

## 2016-11-30 MED ORDER — ETOMIDATE 2 MG/ML IV SOLN
INTRAVENOUS | Status: AC
  Filled 2016-11-30: qty 10

## 2016-11-30 MED ORDER — LORAZEPAM 2 MG/ML IJ SOLN
0.5000 mg | INTRAMUSCULAR | Status: DC | PRN
Start: 2016-11-30 — End: 2016-12-02

## 2016-11-30 MED ORDER — ROCURONIUM BROMIDE 50 MG/5ML IV SOLN
INTRAVENOUS | Status: AC
  Filled 2016-11-30: qty 1

## 2016-11-30 MED ORDER — MIDAZOLAM HCL 2 MG/2ML IJ SOLN
INTRAMUSCULAR | Status: AC
  Filled 2016-11-30: qty 2

## 2016-11-30 MED ORDER — GLYCOPYRROLATE 0.2 MG/ML IJ SOLN
0.2000 mg | Freq: Once | INTRAMUSCULAR | Status: AC
  Administered 2016-11-30: 0.2 mg via INTRAVENOUS
  Filled 2016-11-30: qty 1

## 2016-11-30 MED ORDER — SODIUM CHLORIDE 0.9 % IR SOLN
Status: DC | PRN
  Administered 2016-11-30: 1000 mL

## 2016-11-30 MED ORDER — INSULIN ASPART 100 UNIT/ML ~~LOC~~ SOLN
0.0000 [IU] | Freq: Three times a day (TID) | SUBCUTANEOUS | Status: DC
  Administered 2016-12-03: 1 [IU] via SUBCUTANEOUS
  Filled 2016-11-30: qty 0.09

## 2016-11-30 MED ORDER — SUCCINYLCHOLINE 20MG/ML (10ML) SYRINGE FOR MEDFUSION PUMP - OPTIME
INTRAMUSCULAR | Status: DC | PRN
  Administered 2016-11-30: 100 mg via INTRAVENOUS

## 2016-11-30 MED ORDER — FENTANYL CITRATE (PF) 100 MCG/2ML IJ SOLN
INTRAMUSCULAR | Status: DC | PRN
  Administered 2016-11-30 (×5): 50 ug via INTRAVENOUS

## 2016-11-30 MED ORDER — GLYCOPYRROLATE 0.2 MG/ML IJ SOLN
INTRAMUSCULAR | Status: DC | PRN
  Administered 2016-11-30: .8 mg via INTRAVENOUS

## 2016-11-30 MED ORDER — POVIDONE-IODINE 10 % OINT PACKET
TOPICAL_OINTMENT | CUTANEOUS | Status: DC | PRN
  Administered 2016-11-30: 1 via TOPICAL

## 2016-11-30 MED ORDER — BUPIVACAINE HCL (PF) 0.5 % IJ SOLN
INTRAMUSCULAR | Status: DC | PRN
  Administered 2016-11-30: 8 mL

## 2016-11-30 MED ORDER — SIMETHICONE 80 MG PO CHEW: 40.0000 mg | CHEWABLE_TABLET | Freq: Four times a day (QID) | ORAL | Status: DC | PRN

## 2016-11-30 MED ORDER — POVIDONE-IODINE 10 % EX OINT
TOPICAL_OINTMENT | CUTANEOUS | Status: AC
  Filled 2016-11-30: qty 1

## 2016-11-30 MED ORDER — LACTATED RINGERS IV SOLN: INTRAVENOUS | Status: DC

## 2016-11-30 MED ORDER — HEMOSTATIC AGENTS (NO CHARGE) OPTIME
TOPICAL | Status: DC | PRN
  Administered 2016-11-30 (×2): 1 via TOPICAL

## 2016-11-30 MED ORDER — INSULIN ASPART 100 UNIT/ML ~~LOC~~ SOLN
0.0000 [IU] | Freq: Every day | SUBCUTANEOUS | Status: DC
  Filled 2016-11-30: qty 0.05

## 2016-11-30 MED ORDER — MIDAZOLAM HCL 2 MG/2ML IJ SOLN
1.0000 mg | INTRAMUSCULAR | Status: DC
  Administered 2016-11-30: 2 mg via INTRAVENOUS

## 2016-11-30 MED ORDER — FENTANYL CITRATE (PF) 100 MCG/2ML IJ SOLN
25.0000 ug | INTRAMUSCULAR | Status: DC | PRN
  Administered 2016-11-30: 25 ug via INTRAVENOUS
  Filled 2016-11-30: qty 2

## 2016-11-30 MED ORDER — NEOSTIGMINE METHYLSULFATE 10 MG/10ML IV SOLN
INTRAVENOUS | Status: DC | PRN
  Administered 2016-11-30: 5 mg via INTRAVENOUS

## 2016-11-30 MED ORDER — GLYCOPYRROLATE 0.2 MG/ML IJ SOLN
INTRAMUSCULAR | Status: AC
  Filled 2016-11-30: qty 3

## 2016-11-30 MED ORDER — ETOMIDATE 2 MG/ML IV SOLN
INTRAVENOUS | Status: DC | PRN
  Administered 2016-11-30: 10 mg via INTRAVENOUS
  Administered 2016-11-30: 4 mg via INTRAVENOUS

## 2016-11-30 MED ORDER — LIDOCAINE HCL (PF) 1 % IJ SOLN
INTRAMUSCULAR | Status: AC
  Filled 2016-11-30: qty 5

## 2016-11-30 MED ORDER — ENOXAPARIN SODIUM 40 MG/0.4ML ~~LOC~~ SOLN
40.0000 mg | SUBCUTANEOUS | Status: DC
  Administered 2016-12-01 – 2016-12-04 (×4): 40 mg via SUBCUTANEOUS
  Filled 2016-11-30 (×4): qty 0.4

## 2016-11-30 MED ORDER — FENTANYL CITRATE (PF) 250 MCG/5ML IJ SOLN
INTRAMUSCULAR | Status: AC
  Filled 2016-11-30: qty 5

## 2016-11-30 MED ORDER — NEOSTIGMINE METHYLSULFATE 10 MG/10ML IV SOLN
INTRAVENOUS | Status: AC
  Filled 2016-11-30: qty 1

## 2016-11-30 MED ORDER — SUCCINYLCHOLINE CHLORIDE 20 MG/ML IJ SOLN
INTRAMUSCULAR | Status: AC
  Filled 2016-11-30: qty 1

## 2016-11-30 MED ORDER — ONDANSETRON HCL 4 MG/2ML IJ SOLN
4.0000 mg | Freq: Once | INTRAMUSCULAR | Status: AC
  Administered 2016-11-30: 4 mg via INTRAVENOUS

## 2016-11-30 MED ORDER — LIDOCAINE HCL (CARDIAC) 10 MG/ML IV SOLN
INTRAVENOUS | Status: DC | PRN
  Administered 2016-11-30: 40 mg via INTRAVENOUS

## 2016-11-30 MED ORDER — LACTATED RINGERS IV SOLN
INTRAVENOUS | Status: DC
  Administered 2016-11-30: 01:00:00 via INTRAVENOUS

## 2016-11-30 SURGICAL SUPPLY — 48 items
APL SRG 38 LTWT LNG FL B (MISCELLANEOUS) ×1
APPLICATOR ARISTA FLEXITIP XL (MISCELLANEOUS) ×1 IMPLANT
APPLIER CLIP LAPSCP 10X32 DD (CLIP) ×2 IMPLANT
BAG HAMPER (MISCELLANEOUS) ×2 IMPLANT
BAG RETRIEVAL 10 (BASKET) ×1
CHLORAPREP W/TINT 26ML (MISCELLANEOUS) ×2 IMPLANT
CLOTH BEACON ORANGE TIMEOUT ST (SAFETY) ×2 IMPLANT
COVER LIGHT HANDLE STERIS (MISCELLANEOUS) ×4 IMPLANT
CUTTER FLEX LINEAR 45M (STAPLE) ×1 IMPLANT
DECANTER SPIKE VIAL GLASS SM (MISCELLANEOUS) ×2 IMPLANT
ELECT REM PT RETURN 9FT ADLT (ELECTROSURGICAL) ×2
ELECTRODE REM PT RTRN 9FT ADLT (ELECTROSURGICAL) ×1 IMPLANT
FILTER SMOKE EVAC LAPAROSHD (FILTER) ×2 IMPLANT
FORMALIN 10 PREFIL 120ML (MISCELLANEOUS) ×2 IMPLANT
GLOVE BIOGEL M 7.0 STRL (GLOVE) ×1 IMPLANT
GLOVE BIOGEL PI IND STRL 7.0 (GLOVE) ×1 IMPLANT
GLOVE BIOGEL PI INDICATOR 7.0 (GLOVE) ×1
GLOVE SURG SS PI 7.5 STRL IVOR (GLOVE) ×2 IMPLANT
GOWN STRL REUS W/ TWL XL LVL3 (GOWN DISPOSABLE) ×1 IMPLANT
GOWN STRL REUS W/TWL LRG LVL3 (GOWN DISPOSABLE) ×4 IMPLANT
GOWN STRL REUS W/TWL XL LVL3 (GOWN DISPOSABLE) ×2
HEMOSTAT ARISTA ABSORB 3G PWDR (MISCELLANEOUS) ×1 IMPLANT
HEMOSTAT SNOW SURGICEL 2X4 (HEMOSTASIS) ×2 IMPLANT
INST SET LAPROSCOPIC AP (KITS) ×2 IMPLANT
IV NS IRRIG 3000ML ARTHROMATIC (IV SOLUTION) IMPLANT
KIT ROOM TURNOVER APOR (KITS) ×2 IMPLANT
MANIFOLD NEPTUNE II (INSTRUMENTS) ×2 IMPLANT
NDL INSUFFLATION 14GA 120MM (NEEDLE) ×1 IMPLANT
NEEDLE INSUFFLATION 14GA 120MM (NEEDLE) ×2 IMPLANT
NS IRRIG 1000ML POUR BTL (IV SOLUTION) ×2 IMPLANT
PACK LAP CHOLE LZT030E (CUSTOM PROCEDURE TRAY) ×2 IMPLANT
PAD ARMBOARD 7.5X6 YLW CONV (MISCELLANEOUS) ×2 IMPLANT
RELOAD 45 VASCULAR/THIN (ENDOMECHANICALS) ×2 IMPLANT
RELOAD STAPLE 45 2.5 WHT GRN (ENDOMECHANICALS) IMPLANT
SET BASIN LINEN APH (SET/KITS/TRAYS/PACK) ×2 IMPLANT
SET TUBE IRRIG SUCTION NO TIP (IRRIGATION / IRRIGATOR) IMPLANT
SLEEVE ENDOPATH XCEL 5M (ENDOMECHANICALS) ×2 IMPLANT
SPONGE GAUZE 2X2 8PLY STRL LF (GAUZE/BANDAGES/DRESSINGS) ×8 IMPLANT
STAPLER VISISTAT (STAPLE) ×2 IMPLANT
SYS BAG RETRIEVAL 10MM (BASKET) ×1
SYSTEM BAG RETRIEVAL 10MM (BASKET) ×1 IMPLANT
TAPE CLOTH SURG 4X10 WHT LF (GAUZE/BANDAGES/DRESSINGS) ×1 IMPLANT
TROCAR ENDO BLADELESS 11MM (ENDOMECHANICALS) ×2 IMPLANT
TROCAR XCEL NON-BLD 5MMX100MML (ENDOMECHANICALS) ×2 IMPLANT
TROCAR XCEL UNIV SLVE 11M 100M (ENDOMECHANICALS) ×2 IMPLANT
TUBE CONNECTING 12X1/4 (SUCTIONS) ×2 IMPLANT
TUBING INSUFFLATION (TUBING) ×2 IMPLANT
WARMER LAPAROSCOPE (MISCELLANEOUS) ×2 IMPLANT
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
## 2016-11-30 NOTE — Transfer of Care (Signed)
Immediate Anesthesia Transfer of Care Note  Patient: Ronald Russell  Procedure(s) Performed: Procedure(s): LAPAROSCOPIC CHOLECYSTECTOMY (N/A)  Patient Location: PACU  Anesthesia Type:General  Level of Consciousness: awake  Airway & Oxygen Therapy: Patient Spontanous Breathing and Patient connected to face mask oxygen  Post-op Assessment: Report given to RN  Post vital signs: Reviewed and stable  Last Vitals:  Vitals:   11/30/16 1215 11/30/16 1230  BP: 109/65 110/62  Pulse:    Resp: (!) 30 (!) 30  Temp:      Last Pain:  Vitals:   11/30/16 1116  TempSrc: Axillary  PainSc: 4       Patients Stated Pain Goal: 7 (98/72/15 8727)  Complications: No apparent anesthesia complications

## 2016-11-30 NOTE — Progress Notes (Signed)
PROGRESS NOTE    Ronald Russell  KDT:[id number] DOB: [birth year] DOA: 11/29/2016 PCP: Claretta Fraise, MD    Brief Narrative:  82 year old male presents to the hospital with abdominal pain. Found to have acute cholecystitis with cholelithiasis . General surgery consulted and plans on cholecystectomy today.   Assessment & Plan:   Principal Problem:   Acute cholecystitis Active Problems:   Hypertension   GAD (generalized anxiety disorder)   Renal insufficiency   Hyperglycemia   Thrombocytopenia ([place])   1. Acute cholecystitis with cholelithiasis. General surgery following. Plans are for operative management with cholecystectomy today. Continue supportive management. 2. Hypertension. Continue patient on atenolol. Blood pressure stable. 3. Generalized anxiety disorder. Continue on home dose of Ativan. 4. Hyperglycemia. Fasting sugar this morning is 131. Will check serial CBGs and A1c. Possibly stress related. 5. Thrombocytopenia possibly related to acute illness. Continue to follow. 6. CKD stage 3. Creatinine is currently at baseline. Continue to follow.   DVT prophylaxis: SCDs Code Status: Full code Family Communication: Discussed with niece at the bedside Disposition Plan: Discharge home after procedure.   Consultants:   Gen. surgery  Procedures:     Antimicrobials:      Subjective: Abdominal pain is better after receiving pain medications. No vomiting.  Objective: Vitals:   11/29/16 1730 11/29/16 1830 11/29/16 2019 11/30/16 0501  BP: (!) 141/74 (!) 121/51 (!) 142/71 (!) 131/59  Pulse: 69 76 84 77  Resp: 18 (!) 21 (!) 21 (!) 21  Temp:   98.4 F (36.9 C) 99.6 F (37.6 C)  TempSrc:   Oral Oral  SpO2: 96% 93% 95% 94%  Weight:   89.4 kg (197 lb 1.5 oz)   Height:   6' (1.829 m)     Intake/Output Summary (Last 24 hours) at 11/30/16 1049 Last data filed at 11/30/16 0400  Gross per 24 hour  Intake           301.25 ml  Output                0 ml  Net            301.25 ml   Filed Weights   11/29/16 1332 11/29/16 2019  Weight: 92.5 kg (204 lb) 89.4 kg (197 lb 1.5 oz)    Examination:  General exam: Appears calm and comfortable  Respiratory system: Clear to auscultation. Respiratory effort normal. Cardiovascular system: S1 & S2 heard, RRR. No JVD, murmurs, rubs, gallops or clicks. No pedal edema. Gastrointestinal system: Abdomen is nondistended, soft and tender in RUQ. No organomegaly or masses felt. Normal bowel sounds heard. Central nervous system: Alert and oriented. No focal neurological deficits. Extremities: Symmetric 5 x 5 power. Skin: No rashes, lesions or ulcers Psychiatry: Judgement and insight appear normal. Mood & affect appropriate.     Data Reviewed: I have personally reviewed following labs and imaging studies  CBC:  Recent Labs Lab 11/29/16 1508 11/30/16 0445  WBC 10.1 13.0*  HGB 14.2 14.2  HCT 42.0 42.0  MCV 91.3 91.5  PLT 111* 998*   Basic Metabolic Panel:  Recent Labs Lab 11/29/16 1508 11/30/16 0445  NA 138 136  K 3.9 3.6  CL 106 103  CO2 24 23  GLUCOSE 119* 131*  BUN 15 14  CREATININE 1.29* 1.21  CALCIUM 8.5* 8.3*   GFR: Estimated Creatinine Clearance: 53.4 mL/min (by C-G formula based on SCr of 1.21 mg/dL). Liver Function Tests:  Recent Labs Lab 11/29/16 1508  AST 18  ALT 17  ALKPHOS 47  BILITOT 1.3*  PROT 6.4*  ALBUMIN 3.8    Recent Labs Lab 11/29/16 1508  LIPASE 24   No results for input(s): AMMONIA in the last 168 hours. Coagulation Profile: No results for input(s): INR, PROTIME in the last 168 hours. Cardiac Enzymes: No results for input(s): CKTOTAL, CKMB, CKMBINDEX, TROPONINI in the last 168 hours. BNP (last 3 results) No results for input(s): PROBNP in the last 8760 hours. HbA1C: No results for input(s): HGBA1C in the last 72 hours. CBG: No results for input(s): GLUCAP in the last 168 hours. Lipid Profile: No results for input(s): CHOL, HDL, LDLCALC, TRIG, CHOLHDL,  LDLDIRECT in the last 72 hours. Thyroid Function Tests: No results for input(s): TSH, T4TOTAL, FREET4, T3FREE, THYROIDAB in the last 72 hours. Anemia Panel: No results for input(s): VITAMINB12, FOLATE, FERRITIN, TIBC, IRON, RETICCTPCT in the last 72 hours. Sepsis Labs: No results for input(s): PROCALCITON, LATICACIDVEN in the last 168 hours.  No results found for this or any previous visit (from the past 240 hour(s)).       Radiology Studies: Ct Abdomen Pelvis W Contrast  Result Date: 11/29/2016 CLINICAL DATA:  LEFT upper quadrant pain after eating tenderloin, biscuits and gravy yesterday morning. Nausea and vomiting beginning this morning. History of hypertension, bladder cancer, clotting disorder, immune deficiency. EXAM: CT ABDOMEN AND PELVIS WITH CONTRAST TECHNIQUE: Multidetector CT imaging of the abdomen and pelvis was performed using the standard protocol following bolus administration of intravenous contrast. CONTRAST:  100 cc Isovue-300 COMPARISON:  CT abdomen and pelvis December 24, 2016 FINDINGS: LOWER CHEST: Dependent atelectasis. Heart size is normal. No pericardial effusion. HEPATOBILIARY: Subcentimeter gallstones at the neck. Mild gallbladder distension pericholecystic fat stranding. 4 mm gallstone within the proximal cystic duct. Subcentimeter probable cyst LEFT lobe of the liver. Trace intrahepatic biliary dilatation. PANCREAS: Normal. SPLEEN: Normal. ADRENALS/URINARY TRACT: Kidneys are orthotopic, demonstrating symmetric enhancement. No nephrolithiasis, hydronephrosis or solid renal masses. Too small to characterize hypodensities in the kidneys bilaterally. 2.5 cm homogeneously hypodense LEFT lower pole renal cyst. Punctate LEFT lower pole nephrolithiasis. The unopacified ureters are normal in course and caliber. Delayed imaging through the kidneys demonstrates symmetric prompt contrast excretion within the proximal urinary collecting system. Urinary bladder is partially distended and  unremarkable. 19 mm LEFT adrenal mass, previously characterized as benign adenoma by noncontrast CT. STOMACH/BOWEL: Small hiatal hernia. The stomach, small and large bowel are normal in course and caliber without inflammatory changes. Mild LEFT colon diverticulosis. Normal appendix. VASCULAR/LYMPHATIC: Ectatic infrarenal aorta without aneurysm. Moderate to severe calcific atherosclerosis No lymphadenopathy by CT size criteria. REPRODUCTIVE: Prostatic calcifications without prostatomegaly. OTHER: No intraperitoneal free fluid or free air. MUSCULOSKELETAL: Nonacute. Mild degenerative change of the hips. Moderate L4-5 and moderate to severe L5-S1 degenerative discs. IMPRESSION: Cholelithiasis and suspected acute cholecystitis. 4 mm stone within the proximal cystic duct. Trace intrahepatic biliary dilatation. Punctate nonobstructing LEFT nephrolithiasis. Electronically Signed   By: Elon Alas M.D.   On: 11/29/2016 17:08   Dg Chest Port 1 View  Result Date: 11/29/2016 CLINICAL DATA:  Epigastric pain, nausea and vomiting today. History of hypertension, former smoker, bladder cancer. EXAM: PORTABLE CHEST 1 VIEW COMPARISON:  Chest radiograph September 21, 2016 FINDINGS: Cardiomediastinal silhouette is unremarkable for this low inspiratory examination with crowded vasculature markings. Mildly calcified aortic knob. The lungs are clear without pleural effusions or focal consolidations. Trachea projects midline and there is no pneumothorax. Re- demonstration of moderate hiatal hernia. Included soft tissue planes and osseous structures are non-suspicious. IMPRESSION: No acute  cardiopulmonary process for this low inspiratory portable examination. Mild atherosclerosis. Electronically Signed   By: Elon Alas M.D.   On: 11/29/2016 14:58        Scheduled Meds: . atenolol  50 mg Oral Daily  . colchicine  0.6 mg Oral BID  . febuxostat  40 mg Oral Daily  . loratadine  10 mg Oral Daily  . LORazepam  1 mg  Intravenous Q6H  . pantoprazole  40 mg Oral Daily  . pravastatin  40 mg Oral q1800   Continuous Infusions: . lactated ringers 75 mL/hr at 11/30/16 0039  . piperacillin-tazobactam (ZOSYN)  IV 3.375 g (11/30/16 0846)     LOS: 1 day    Time spent: 25mins    Shakila Mak, MD [facility] Pager [phone]  If 7PM-7AM, please contact night-coverage [email or web address] Password [facility] 11/30/2016, 10:49 AM

## 2016-11-30 NOTE — Anesthesia Preprocedure Evaluation (Signed)
Anesthesia Evaluation  Patient identified by MRN, date of birth, ID band Patient awake    Reviewed: Allergy & Precautions, H&P , NPO status , Patient's Chart, lab work & pertinent test results  History of Anesthesia Complications (+) PONV and history of anesthetic complications  Airway Mallampati: II  TM Distance: >3 FB Neck ROM: Full    Dental no notable dental hx. (+) Teeth Intact, Dental Advisory Given   Pulmonary neg pulmonary ROS, former smoker,    Pulmonary exam normal breath sounds clear to auscultation       Cardiovascular hypertension, On Medications + Peripheral Vascular Disease   Rhythm:Regular Rate:Normal     Neuro/Psych  Headaches, PSYCHIATRIC DISORDERS Anxiety negative psych ROS   GI/Hepatic Neg liver ROS, GERD  Medicated and Controlled,  Endo/Other  negative endocrine ROS  Renal/GU Renal diseasenegative Renal ROS  negative genitourinary   Musculoskeletal   Abdominal   Peds  Hematology negative hematology ROS (+)   Anesthesia Other Findings   Reproductive/Obstetrics negative OB ROS                             Anesthesia Physical Anesthesia Plan  ASA: III  Anesthesia Plan: General   Post-op Pain Management:    Induction: Intravenous, Rapid sequence and Cricoid pressure planned  PONV Risk Score and Plan:   Airway Management Planned: Oral ETT  Additional Equipment:   Intra-op Plan:   Post-operative Plan: Extubation in OR  Informed Consent: I have reviewed the patients History and Physical, chart, labs and discussed the procedure including the risks, benefits and alternatives for the proposed anesthesia with the patient or authorized representative who has indicated his/her understanding and acceptance.     Plan Discussed with:   Anesthesia Plan Comments: (amidate induction )        Anesthesia Quick Evaluation

## 2016-11-30 NOTE — Anesthesia Procedure Notes (Signed)
Procedure Name: Intubation Date/Time: 11/30/2016 1:18 PM Performed by: Tressie Stalker E Pre-anesthesia Checklist: Patient identified, Patient being monitored, Timeout performed, Emergency Drugs available and Suction available Patient Re-evaluated:Patient Re-evaluated prior to inductionOxygen Delivery Method: Circle system utilized Preoxygenation: Pre-oxygenation with 100% oxygen Intubation Type: IV induction, Rapid sequence and Cricoid Pressure applied Ventilation: Mask ventilation without difficulty Laryngoscope Size: Mac and 3 Grade View: Grade I Tube type: Oral Tube size: 8.0 mm Number of attempts: 1 Airway Equipment and Method: Stylet Placement Confirmation: ETT inserted through vocal cords under direct vision,  positive ETCO2 and breath sounds checked- equal and bilateral Secured at: 21 cm Tube secured with: Tape Dental Injury: Teeth and Oropharynx as per pre-operative assessment

## 2016-11-30 NOTE — Op Note (Signed)
Patient:  Ronald Russell  DOB:  1934-08-25  MRN:  [id number]   Preop Diagnosis:  Acute cholecystitis, cholelithiasis  Postop Diagnosis:  Same  Procedure:  Laparoscopic cholecystectomy  Surgeon:  Aviva Signs, M.D.  Anes:  Gen. endotracheal  Indications:  Patient is an 82 year old white male who presents with a three-day history of worsening right upper quadrant abdominal pain. CT scan of the abdomen reveals acute cholecystitis with cholelithiasis. The patient now comes to the operating room for laparoscopic cholecystectomy. The risks and benefits of the procedure including bleeding, infection, hepatobiliary injury, and the possibility of an open seizure were fully explained to the patient, who gave informed consent.  Procedure note:  The patient was placed in the supine position. After induction of general endotracheal anesthesia, the abdomen was prepped and draped using the usual sterile technique with DuraPrep. Surgical site confirmation was performed.  A supraumbilical incision was made down to the fascia. A Veress needle was introduced into the abdominal cavity and confirmation of placement was done using the saline drop test the abdomen was then insufflated to 16 mmHg pressure. An 11 mm trocar was introduced into the abdominal cavity under direct visualization without difficulty. The patient was placed in reverse Trendelenburg position and an additional 11 mm trocar was placed the epigastric region and 5 mm trochars were placed the right upper quadrant and right flank regions. The liver was inspected and noted to be within normal limits. The gallbladder was noted to be distended with pericholecystic fluid inflammation and a thickened gallbladder wall. The gallbladder was decompressed with suction in order to provide adequate mobility during the dissection. The gallbladder was then retracted in a dynamic fashion in order to provide a critical view of the triangle of Calot. The cystic duct was  first identified. Its juncture to the infundibulum was fully identified. A vascular Endo GIA was placed across the cystic duct and fired. The cystic artery was ligated and divided using clips. The gallbladder was freed away from the gallbladder fossa using Bovie electrocautery. The gallbladder was delivered through the epigastric trocar site using an Endo Catch bag. The gallbladder fossa was inspected and no abnormal bleeding or bile leakage was noted. Surgicel and Arista were placed in the gallbladder fossa. All fluid and air were then evacuated from the abdomen prior to the removal of the trochars.  All wounds were irrigated with normal saline. All wounds were injected with 0.5% Sensorcaine. The super umbilical fascia as well as epigastric fascia were reapproximated using 0 Vicryl interrupted sutures. All skin incisions were closed using staples. Betadine ointment and dry sterile dressings were applied.  All tape and needle counts were correct at the end of the procedure. The patient was extubated in the operating room and transferred to PACU in stable condition.  Complications:  None  EBL:  Minimal  Specimen:  Gallbladder

## 2016-11-30 NOTE — Anesthesia Postprocedure Evaluation (Signed)
Anesthesia Post Note  Patient: Ronald Russell  Procedure(s) Performed: Procedure(s) (LRB): LAPAROSCOPIC CHOLECYSTECTOMY (N/A)  Patient location during evaluation: PACU Anesthesia Type: General Level of consciousness: awake and alert and oriented Vital Signs Assessment: post-procedure vital signs reviewed and stable Respiratory status: spontaneous breathing Cardiovascular status: stable Postop Assessment: no signs of nausea or vomiting Anesthetic complications: no     Last Vitals:  Vitals:   11/30/16 1415 11/30/16 1430  BP: (!) 149/99 139/69  Pulse: 92 92  Resp: 18 20  Temp:      Last Pain:  Vitals:   11/30/16 1430  TempSrc:   PainSc: Asleep                 Sherrica Niehaus

## 2016-12-01 ENCOUNTER — Encounter (HOSPITAL_COMMUNITY): Payer: Self-pay | Admitting: General Surgery

## 2016-12-01 LAB — CBC
HCT: 41.4 % (ref 39.0–52.0)
Hemoglobin: 13.9 g/dL (ref 13.0–17.0)
MCH: 31.1 pg (ref 26.0–34.0)
MCHC: 33.6 g/dL (ref 30.0–36.0)
MCV: 92.6 fL (ref 78.0–100.0)
Platelets: 102 10*3/uL — ABNORMAL LOW (ref 150–400)
RBC: 4.47 MIL/uL (ref 4.22–5.81)
RDW: 14.2 % (ref 11.5–15.5)
WBC: 12.8 10*3/uL — ABNORMAL HIGH (ref 4.0–10.5)

## 2016-12-01 LAB — COMPREHENSIVE METABOLIC PANEL
ALT: 41 U/L (ref 17–63)
AST: 45 U/L — ABNORMAL HIGH (ref 15–41)
Albumin: 2.8 g/dL — ABNORMAL LOW (ref 3.5–5.0)
Alkaline Phosphatase: 46 U/L (ref 38–126)
Anion gap: 9 (ref 5–15)
BUN: 25 mg/dL — ABNORMAL HIGH (ref 6–20)
CO2: 26 mmol/L (ref 22–32)
Calcium: 8 mg/dL — ABNORMAL LOW (ref 8.9–10.3)
Chloride: 103 mmol/L (ref 101–111)
Creatinine, Ser: 1.89 mg/dL — ABNORMAL HIGH (ref 0.61–1.24)
GFR calc Af Amer: 37 mL/min — ABNORMAL LOW (ref >60)
GFR calc non Af Amer: 32 mL/min — ABNORMAL LOW (ref >60)
Glucose, Bld: 108 mg/dL — ABNORMAL HIGH (ref 65–99)
Potassium: 4.1 mmol/L (ref 3.5–5.1)
Sodium: 138 mmol/L (ref 135–145)
Total Bilirubin: 1.7 mg/dL — ABNORMAL HIGH (ref 0.3–1.2)
Total Protein: 5.5 g/dL — ABNORMAL LOW (ref 6.5–8.1)

## 2016-12-01 LAB — HEMOGLOBIN A1C
Hgb A1c MFr Bld: 5.3 % (ref 4.8–5.6)
Mean Plasma Glucose: 105 mg/dL

## 2016-12-01 LAB — GLUCOSE, CAPILLARY
Glucose-Capillary: 94 mg/dL (ref 65–99)
Glucose-Capillary: 109 mg/dL — ABNORMAL HIGH (ref 65–99)
Glucose-Capillary: 91 mg/dL (ref 65–99)
Glucose-Capillary: 110 mg/dL — ABNORMAL HIGH (ref 65–99)

## 2016-12-01 MED ORDER — MAGNESIUM HYDROXIDE 400 MG/5ML PO SUSP
30.0000 mL | Freq: Two times a day (BID) | ORAL | Status: DC
  Administered 2016-12-01 – 2016-12-06 (×3): 30 mL via ORAL
  Filled 2016-12-01 (×5): qty 30

## 2016-12-01 MED ORDER — LACTATED RINGERS IV BOLUS (SEPSIS)
500.0000 mL | Freq: Once | INTRAVENOUS | Status: AC
  Administered 2016-12-01: 500 mL via INTRAVENOUS

## 2016-12-01 MED ORDER — IPRATROPIUM-ALBUTEROL 0.5-2.5 (3) MG/3ML IN SOLN
3.0000 mL | RESPIRATORY_TRACT | Status: DC | PRN
  Administered 2016-12-01 – 2016-12-05 (×4): 3 mL via RESPIRATORY_TRACT
  Filled 2016-12-01 (×4): qty 3

## 2016-12-01 MED ORDER — SODIUM CHLORIDE 0.9 % IV BOLUS (SEPSIS): 500.0000 mL | Freq: Once | INTRAVENOUS | Status: DC

## 2016-12-01 NOTE — Progress Notes (Signed)
Patient complains of having a hard time catching breath. SPO2: 97%, on 2 Liters of oxygen. Patient lung sounds clear bilaterally, audible wheezes cleared when patient clears throat. MD made aware.

## 2016-12-01 NOTE — Anesthesia Postprocedure Evaluation (Signed)
Anesthesia Post Note  Patient: Ronald Russell  Procedure(s) Performed: Procedure(s) (LRB): LAPAROSCOPIC CHOLECYSTECTOMY (N/A)  Patient location during evaluation: PACU Anesthesia Type: General Level of consciousness: awake and alert and oriented Pain management: pain level controlled (receiving morphine for pain relief) Vital Signs Assessment: post-procedure vital signs reviewed and stable Respiratory status: spontaneous breathing and patient connected to nasal cannula oxygen Cardiovascular status: stable : Receiving Zofran for nausea. Anesthetic complications: no     Last Vitals:  Vitals:   12/01/16 0530 12/01/16 1437  BP: (!) 105/45 (!) 107/49  Pulse: 94 78  Resp: 20 20  Temp: 36.9 C 37.3 C    Last Pain:  Vitals:   12/01/16 1437  TempSrc: Oral  PainSc:                  ADAMS, AMY A

## 2016-12-01 NOTE — Care Management Note (Signed)
Case Management Note  Patient Details  Name: Ronald Russell MRN: [id number] Date of Birth: 07/30/1934  Subjective/Objective:                  Pt s/p lap chole. Pt from home, ind with ADL's, drives to appointments. States he has no family for support. He has PCP and insurance with drug coverage.  Pt requiring supplemental oxygen which he does not need at baseline. Pt communicates no needs.   Action/Plan: Anticipate DC home with self care. Pt will ambulate today and wean from oxygen . CM will follow to DC.   Expected Discharge Date:      12/01/2016            Expected Discharge Plan:  Home/Self Care  In-House Referral:  NA  Discharge planning Services  CM Consult  Post Acute Care Choice:  NA Choice offered to:  NA  Status of Service:  In process, will continue to follow  Sherald Barge, RN 12/01/2016, 1:02 PM

## 2016-12-01 NOTE — Addendum Note (Signed)
Addendum  created 12/01/16 1455 by Mickel Baas, CRNA   Sign clinical note

## 2016-12-01 NOTE — Progress Notes (Signed)
1 Day Post-Op  Subjective: Patient complaining of moderate incisional pain. Appetite decreased. No emesis noted.  Objective: Vital signs in last 24 hours: Temp:  [97.9 F (36.6 C)-99.2 F (37.3 C)] 98.5 F (36.9 C) (06/20 0530) Pulse Rate:  [87-94] 94 (06/20 0530) Resp:  [16-30] 20 (06/20 0530) BP: (99-157)/(45-99) 105/45 (06/20 0530) SpO2:  [89 %-98 %] 92 % (06/20 0530) Last BM Date: 11/29/16  Intake/Output from previous day: 06/19 0701 - 06/20 0700 In: 3386.3 [I.V.:3236.3; IV Piggyback:150] Out: 10 [Blood:10] Intake/Output this shift: No intake/output data recorded.  General appearance: alert, cooperative and no distress GI: Soft, mildly distended. Incisions healing well.  Lab Results:   Recent Labs  11/30/16 0445 12/01/16 0547  WBC 13.0* 12.8*  HGB 14.2 13.9  HCT 42.0 41.4  PLT 113* 102*   BMET  Recent Labs  11/30/16 0445 12/01/16 0547  NA 136 138  K 3.6 4.1  CL 103 103  CO2 23 26  GLUCOSE 131* 108*  BUN 14 25*  CREATININE 1.21 1.89*  CALCIUM 8.3* 8.0*   PT/INR No results for input(s): LABPROT, INR in the last 72 hours.  Studies/Results: Ct Abdomen Pelvis W Contrast  Result Date: 11/29/2016 CLINICAL DATA:  LEFT upper quadrant pain after eating tenderloin, biscuits and gravy yesterday morning. Nausea and vomiting beginning this morning. History of hypertension, bladder cancer, clotting disorder, immune deficiency. EXAM: CT ABDOMEN AND PELVIS WITH CONTRAST TECHNIQUE: Multidetector CT imaging of the abdomen and pelvis was performed using the standard protocol following bolus administration of intravenous contrast. CONTRAST:  100 cc Isovue-300 COMPARISON:  CT abdomen and pelvis December 24, 2016 FINDINGS: LOWER CHEST: Dependent atelectasis. Heart size is normal. No pericardial effusion. HEPATOBILIARY: Subcentimeter gallstones at the neck. Mild gallbladder distension pericholecystic fat stranding. 4 mm gallstone within the proximal cystic duct. Subcentimeter  probable cyst LEFT lobe of the liver. Trace intrahepatic biliary dilatation. PANCREAS: Normal. SPLEEN: Normal. ADRENALS/URINARY TRACT: Kidneys are orthotopic, demonstrating symmetric enhancement. No nephrolithiasis, hydronephrosis or solid renal masses. Too small to characterize hypodensities in the kidneys bilaterally. 2.5 cm homogeneously hypodense LEFT lower pole renal cyst. Punctate LEFT lower pole nephrolithiasis. The unopacified ureters are normal in course and caliber. Delayed imaging through the kidneys demonstrates symmetric prompt contrast excretion within the proximal urinary collecting system. Urinary bladder is partially distended and unremarkable. 19 mm LEFT adrenal mass, previously characterized as benign adenoma by noncontrast CT. STOMACH/BOWEL: Small hiatal hernia. The stomach, small and large bowel are normal in course and caliber without inflammatory changes. Mild LEFT colon diverticulosis. Normal appendix. VASCULAR/LYMPHATIC: Ectatic infrarenal aorta without aneurysm. Moderate to severe calcific atherosclerosis No lymphadenopathy by CT size criteria. REPRODUCTIVE: Prostatic calcifications without prostatomegaly. OTHER: No intraperitoneal free fluid or free air. MUSCULOSKELETAL: Nonacute. Mild degenerative change of the hips. Moderate L4-5 and moderate to severe L5-S1 degenerative discs. IMPRESSION: Cholelithiasis and suspected acute cholecystitis. 4 mm stone within the proximal cystic duct. Trace intrahepatic biliary dilatation. Punctate nonobstructing LEFT nephrolithiasis. Electronically Signed   By: Elon Alas M.D.   On: 11/29/2016 17:08   Dg Chest Port 1 View  Result Date: 11/29/2016 CLINICAL DATA:  Epigastric pain, nausea and vomiting today. History of hypertension, former smoker, bladder cancer. EXAM: PORTABLE CHEST 1 VIEW COMPARISON:  Chest radiograph September 21, 2016 FINDINGS: Cardiomediastinal silhouette is unremarkable for this low inspiratory examination with crowded  vasculature markings. Mildly calcified aortic knob. The lungs are clear without pleural effusions or focal consolidations. Trachea projects midline and there is no pneumothorax. Re- demonstration of moderate hiatal hernia.  Included soft tissue planes and osseous structures are non-suspicious. IMPRESSION: No acute cardiopulmonary process for this low inspiratory portable examination. Mild atherosclerosis. Electronically Signed   By: Elon Alas M.D.   On: 11/29/2016 14:58    Anti-infectives: Anti-infectives    Start     Dose/Rate Route Frequency Ordered Stop   11/30/16 0100  piperacillin-tazobactam (ZOSYN) IVPB 3.375 g     3.375 g 12.5 mL/hr over 240 Minutes Intravenous Every 8 hours 11/29/16 2114     11/29/16 1745  piperacillin-tazobactam (ZOSYN) IVPB 3.375 g     3.375 g 100 mL/hr over 30 Minutes Intravenous  Once 11/29/16 1730 11/29/16 1848      Assessment/Plan: s/p Procedure(s): LAPAROSCOPIC CHOLECYSTECTOMY Impression: Stable on postoperative day 1. Patient did have a significantly inflamed gallbladder. We'll continue IV fluids for now. We'll try to get patient up out of bed.  LOS: 2 days    Aviva Signs 12/01/2016

## 2016-12-01 NOTE — Progress Notes (Signed)
PROGRESS NOTE    Ronald Russell  VOZ:[id number] DOB: [birth year] DOA: 11/29/2016 PCP: Claretta Fraise, MD   Brief Narrative:  82 year old male with past medical history of anxiety, hypertension came to the ER with complaints of abdominal pain and was found to have acute cholecystitis secondary to cholelithiasis. Patient underwent laparoscopic cholecystectomy on 11/30/2016.   Assessment & Plan:   Principal Problem:   Acute cholecystitis Active Problems:   Hypertension   GAD (generalized anxiety disorder)   Renal insufficiency   Hyperglycemia   Thrombocytopenia (HCC)  Acute cholecystitis secondary to cholelithiasis status post laparoscopic cholecystectomy postop day #1 -Continue IV fluids. Monitor urine output -Oral diet as tolerated -Pain control -Out of bed to chair -Advised to use incentive spirometry. Given him instructions how to use it. -We'll stop his Zosyn tomorrow  Hypertension -Continue atenolol. Blood pressure was borderline low this morning but improving with hydration  Gen. anxiety disorder -Continue home dose of Ativan  Chronic kidney disease stage III -Creatinine appears to be stable at this time  Thrombocytopenia -Likely secondary to acute illness -Continue to monitor closely  Hyperglycemia -Hemoglobin A1c 5.3 -Continue Accu-Cheks and insulin sliding scale. No need for maintenance home  DVT prophylaxis: Lovenox Code Status: Full Family Communication:  Patient comprehends well. No family at bedside Disposition Plan: Likely discharge in next 24-48 hours  Consultants:   Surgery  Procedures:   Laparoscopic cholecystectomy on 11/30/2016  Antimicrobials:   Zosyn   Subjective: Patient reports of right sided abdominal pain at the incision site. States due to pain he is having difficulty taking a deep breath at times and also moving around. Remains afebrile and does not have any other complaints.  Objective: Vitals:   11/30/16 1545 11/30/16 2005  11/30/16 2200 12/01/16 0530  BP: 115/63  (!) 99/48 (!) 105/45  Pulse: 87  91 94  Resp: 20  18 20   Temp: 98 F (36.7 C)  98.5 F (36.9 C) 98.5 F (36.9 C)  TempSrc:   Oral Oral  SpO2: 95% 93% 94% 92%  Weight:      Height:        Intake/Output Summary (Last 24 hours) at 12/01/16 1053 Last data filed at 12/01/16 0900  Gross per 24 hour  Intake          3506.25 ml  Output               10 ml  Net          3496.25 ml   Filed Weights   11/29/16 1332 11/29/16 2019  Weight: 92.5 kg (204 lb) 89.4 kg (197 lb 1.5 oz)    Examination:  General exam: Appears calm and comfortable  Respiratory system: Clear to auscultation. Respiratory effort normal. Cardiovascular system: S1 & S2 heard, RRR. No JVD, murmurs, rubs, gallops or clicks. No pedal edema. Gastrointestinal system: Abdomen is Mildly distended, soft and nontender. No organomegaly or masses felt. Normal bowel sounds heard.Pain upon palpation around the incision site otherwise dressing in place and it's healing well. Central nervous system: Alert and oriented. No focal neurological deficits. Extremities: Symmetric 5 x 5 power. Skin: No rashes, lesions or ulcers Psychiatry: Judgement and insight appear normal. Mood & affect appropriate.     Data Reviewed:   CBC:  Recent Labs Lab 11/29/16 1508 11/30/16 0445 12/01/16 0547  WBC 10.1 13.0* 12.8*  HGB 14.2 14.2 13.9  HCT 42.0 42.0 41.4  MCV 91.3 91.5 92.6  PLT 111* 113* 425*   Basic Metabolic Panel:  Recent Labs Lab 11/29/16 1508 11/30/16 0445 12/01/16 0547  NA 138 136 138  K 3.9 3.6 4.1  CL 106 103 103  CO2 24 23 26   GLUCOSE 119* 131* 108*  BUN 15 14 25*  CREATININE 1.29* 1.21 1.89*  CALCIUM 8.5* 8.3* 8.0*   GFR: Estimated Creatinine Clearance: 34.2 mL/min (A) (by C-G formula based on SCr of 1.89 mg/dL (H)). Liver Function Tests:  Recent Labs Lab 11/29/16 1508 12/01/16 0547  AST 18 45*  ALT 17 41  ALKPHOS 47 46  BILITOT 1.3* 1.7*  PROT 6.4* 5.5*    ALBUMIN 3.8 2.8*    Recent Labs Lab 11/29/16 1508  LIPASE 24   No results for input(s): AMMONIA in the last 168 hours. Coagulation Profile: No results for input(s): INR, PROTIME in the last 168 hours. Cardiac Enzymes: No results for input(s): CKTOTAL, CKMB, CKMBINDEX, TROPONINI in the last 168 hours. BNP (last 3 results) No results for input(s): PROBNP in the last 8760 hours. HbA1C:  Recent Labs  11/30/16 0445  HGBA1C 5.3   CBG:  Recent Labs Lab 11/30/16 1129 11/30/16 1418 11/30/16 2223 12/01/16 0739  GLUCAP 107* 101* 115* 94   Lipid Profile: No results for input(s): CHOL, HDL, LDLCALC, TRIG, CHOLHDL, LDLDIRECT in the last 72 hours. Thyroid Function Tests: No results for input(s): TSH, T4TOTAL, FREET4, T3FREE, THYROIDAB in the last 72 hours. Anemia Panel: No results for input(s): VITAMINB12, FOLATE, FERRITIN, TIBC, IRON, RETICCTPCT in the last 72 hours. Sepsis Labs: No results for input(s): PROCALCITON, LATICACIDVEN in the last 168 hours.  Recent Results (from the past 240 hour(s))  Surgical PCR screen     Status: Abnormal   Collection Time: 11/30/16  8:28 AM  Result Value Ref Range Status   MRSA, PCR NEGATIVE NEGATIVE Final   Staphylococcus aureus POSITIVE (A) NEGATIVE Final    Comment:        The Xpert SA Assay (FDA approved for NASAL specimens in patients over 45 years of age), is one component of a comprehensive surveillance program.  Test performance has been validated by [facility] for patients greater than or equal to 35 year old. It is not intended to diagnose infection nor to guide or monitor treatment. RESULT CALLED TO, READ BACK BY AND VERIFIED WITH: HOBBS A. AT 1051A ON [id number] BY THOMPSON S.          Radiology Studies: Ct Abdomen Pelvis W Contrast  Result Date: 11/29/2016 CLINICAL DATA:  LEFT upper quadrant pain after eating tenderloin, biscuits and gravy yesterday morning. Nausea and vomiting beginning this morning. History of  hypertension, bladder cancer, clotting disorder, immune deficiency. EXAM: CT ABDOMEN AND PELVIS WITH CONTRAST TECHNIQUE: Multidetector CT imaging of the abdomen and pelvis was performed using the standard protocol following bolus administration of intravenous contrast. CONTRAST:  100 cc Isovue-300 COMPARISON:  CT abdomen and pelvis December 24, 2016 FINDINGS: LOWER CHEST: Dependent atelectasis. Heart size is normal. No pericardial effusion. HEPATOBILIARY: Subcentimeter gallstones at the neck. Mild gallbladder distension pericholecystic fat stranding. 4 mm gallstone within the proximal cystic duct. Subcentimeter probable cyst LEFT lobe of the liver. Trace intrahepatic biliary dilatation. PANCREAS: Normal. SPLEEN: Normal. ADRENALS/URINARY TRACT: Kidneys are orthotopic, demonstrating symmetric enhancement. No nephrolithiasis, hydronephrosis or solid renal masses. Too small to characterize hypodensities in the kidneys bilaterally. 2.5 cm homogeneously hypodense LEFT lower pole renal cyst. Punctate LEFT lower pole nephrolithiasis. The unopacified ureters are normal in course and caliber. Delayed imaging through the kidneys demonstrates symmetric prompt contrast excretion within the proximal  urinary collecting system. Urinary bladder is partially distended and unremarkable. 19 mm LEFT adrenal mass, previously characterized as benign adenoma by noncontrast CT. STOMACH/BOWEL: Small hiatal hernia. The stomach, small and large bowel are normal in course and caliber without inflammatory changes. Mild LEFT colon diverticulosis. Normal appendix. VASCULAR/LYMPHATIC: Ectatic infrarenal aorta without aneurysm. Moderate to severe calcific atherosclerosis No lymphadenopathy by CT size criteria. REPRODUCTIVE: Prostatic calcifications without prostatomegaly. OTHER: No intraperitoneal free fluid or free air. MUSCULOSKELETAL: Nonacute. Mild degenerative change of the hips. Moderate L4-5 and moderate to severe L5-S1 degenerative discs.  IMPRESSION: Cholelithiasis and suspected acute cholecystitis. 4 mm stone within the proximal cystic duct. Trace intrahepatic biliary dilatation. Punctate nonobstructing LEFT nephrolithiasis. Electronically Signed   By: Elon Alas M.D.   On: 11/29/2016 17:08   Dg Chest Port 1 View  Result Date: 11/29/2016 CLINICAL DATA:  Epigastric pain, nausea and vomiting today. History of hypertension, former smoker, bladder cancer. EXAM: PORTABLE CHEST 1 VIEW COMPARISON:  Chest radiograph September 21, 2016 FINDINGS: Cardiomediastinal silhouette is unremarkable for this low inspiratory examination with crowded vasculature markings. Mildly calcified aortic knob. The lungs are clear without pleural effusions or focal consolidations. Trachea projects midline and there is no pneumothorax. Re- demonstration of moderate hiatal hernia. Included soft tissue planes and osseous structures are non-suspicious. IMPRESSION: No acute cardiopulmonary process for this low inspiratory portable examination. Mild atherosclerosis. Electronically Signed   By: Elon Alas M.D.   On: 11/29/2016 14:58        Scheduled Meds: . atenolol  50 mg Oral Daily  . colchicine  0.6 mg Oral BID  . enoxaparin (LOVENOX) injection  40 mg Subcutaneous Q24H  . febuxostat  40 mg Oral Daily  . insulin aspart  0-5 Units Subcutaneous QHS  . insulin aspart  0-9 Units Subcutaneous TID WC  . loratadine  10 mg Oral Daily  . LORazepam  1 mg Intravenous Q6H  . magnesium hydroxide  30 mL Oral BID  . pantoprazole  40 mg Oral Daily  . pravastatin  40 mg Oral q1800   Continuous Infusions: . lactated ringers    . lactated ringers 100 mL/hr at 12/01/16 0131  . piperacillin-tazobactam (ZOSYN)  IV 3.375 g (12/01/16 0811)     LOS: 2 days    Time spent: 35 mins     Shamarr Faucett Arsenio Loader, MD [facility] Pager [phone]   If 7PM-7AM, please contact night-coverage [email or web address] Password [facility] 12/01/2016, 10:53 AM

## 2016-12-02 ENCOUNTER — Inpatient Hospital Stay (HOSPITAL_COMMUNITY): Payer: PPO

## 2016-12-02 ENCOUNTER — Encounter (HOSPITAL_COMMUNITY): Payer: Self-pay | Admitting: General Surgery

## 2016-12-02 LAB — BASIC METABOLIC PANEL
Anion gap: 10 (ref 5–15)
BUN: 24 mg/dL — ABNORMAL HIGH (ref 6–20)
CO2: 28 mmol/L (ref 22–32)
Calcium: 8.2 mg/dL — ABNORMAL LOW (ref 8.9–10.3)
Chloride: 99 mmol/L — ABNORMAL LOW (ref 101–111)
Creatinine, Ser: 1.55 mg/dL — ABNORMAL HIGH (ref 0.61–1.24)
GFR calc Af Amer: 47 mL/min — ABNORMAL LOW (ref >60)
GFR calc non Af Amer: 41 mL/min — ABNORMAL LOW (ref >60)
Glucose, Bld: 109 mg/dL — ABNORMAL HIGH (ref 65–99)
Potassium: 3.3 mmol/L — ABNORMAL LOW (ref 3.5–5.1)
Sodium: 137 mmol/L (ref 135–145)

## 2016-12-02 LAB — COMPREHENSIVE METABOLIC PANEL
ALT: 34 U/L (ref 17–63)
AST: 33 U/L (ref 15–41)
Albumin: 2.6 g/dL — ABNORMAL LOW (ref 3.5–5.0)
Alkaline Phosphatase: 39 U/L (ref 38–126)
Anion gap: 8 (ref 5–15)
BUN: 26 mg/dL — ABNORMAL HIGH (ref 6–20)
CO2: 27 mmol/L (ref 22–32)
Calcium: 7.8 mg/dL — ABNORMAL LOW (ref 8.9–10.3)
Chloride: 102 mmol/L (ref 101–111)
Creatinine, Ser: 1.43 mg/dL — ABNORMAL HIGH (ref 0.61–1.24)
GFR calc Af Amer: 52 mL/min — ABNORMAL LOW (ref >60)
GFR calc non Af Amer: 45 mL/min — ABNORMAL LOW (ref >60)
Glucose, Bld: 96 mg/dL (ref 65–99)
Potassium: 3.5 mmol/L (ref 3.5–5.1)
Sodium: 137 mmol/L (ref 135–145)
Total Bilirubin: 1.3 mg/dL — ABNORMAL HIGH (ref 0.3–1.2)
Total Protein: 5.4 g/dL — ABNORMAL LOW (ref 6.5–8.1)

## 2016-12-02 LAB — CBC
HCT: 37.6 % — ABNORMAL LOW (ref 39.0–52.0)
HCT: 37.7 % — ABNORMAL LOW (ref 39.0–52.0)
Hemoglobin: 12.7 g/dL — ABNORMAL LOW (ref 13.0–17.0)
Hemoglobin: 12.7 g/dL — ABNORMAL LOW (ref 13.0–17.0)
MCH: 31.1 pg (ref 26.0–34.0)
MCH: 30.6 pg (ref 26.0–34.0)
MCHC: 33.8 g/dL (ref 30.0–36.0)
MCHC: 33.7 g/dL (ref 30.0–36.0)
MCV: 92.2 fL (ref 78.0–100.0)
MCV: 90.8 fL (ref 78.0–100.0)
Platelets: 105 10*3/uL — ABNORMAL LOW (ref 150–400)
Platelets: 119 10*3/uL — ABNORMAL LOW (ref 150–400)
RBC: 4.08 MIL/uL — ABNORMAL LOW (ref 4.22–5.81)
RBC: 4.15 MIL/uL — ABNORMAL LOW (ref 4.22–5.81)
RDW: 14.2 % (ref 11.5–15.5)
RDW: 14.1 % (ref 11.5–15.5)
WBC: 10 10*3/uL (ref 4.0–10.5)
WBC: 10.5 10*3/uL (ref 4.0–10.5)

## 2016-12-02 LAB — GLUCOSE, CAPILLARY
Glucose-Capillary: 90 mg/dL (ref 65–99)
Glucose-Capillary: 96 mg/dL (ref 65–99)
Glucose-Capillary: 99 mg/dL (ref 65–99)
Glucose-Capillary: 110 mg/dL — ABNORMAL HIGH (ref 65–99)

## 2016-12-02 MED ORDER — IPRATROPIUM-ALBUTEROL 0.5-2.5 (3) MG/3ML IN SOLN
3.0000 mL | Freq: Four times a day (QID) | RESPIRATORY_TRACT | Status: DC
  Administered 2016-12-02: 3 mL via RESPIRATORY_TRACT
  Filled 2016-12-02: qty 3

## 2016-12-02 MED ORDER — MUPIROCIN 2 % EX OINT
1.0000 "application " | TOPICAL_OINTMENT | Freq: Two times a day (BID) | CUTANEOUS | Status: DC
  Administered 2016-12-02 – 2016-12-06 (×9): 1 via NASAL
  Filled 2016-12-02 (×2): qty 22

## 2016-12-02 MED ORDER — CHLORHEXIDINE GLUCONATE CLOTH 2 % EX PADS
6.0000 | MEDICATED_PAD | Freq: Every day | CUTANEOUS | Status: AC
  Administered 2016-12-02 – 2016-12-06 (×4): 6 via TOPICAL

## 2016-12-02 MED ORDER — FUROSEMIDE 10 MG/ML IJ SOLN
40.0000 mg | Freq: Two times a day (BID) | INTRAMUSCULAR | Status: AC
  Administered 2016-12-02 – 2016-12-03 (×3): 40 mg via INTRAVENOUS
  Filled 2016-12-02 (×3): qty 4

## 2016-12-02 NOTE — Evaluation (Signed)
Physical Therapy Evaluation Patient Details Name: Ronald Russell MRN: [id number] DOB: August 18, 1934 Today's Date: 12/02/2016   History of Present Illness  Ronald Russell is an 82yo white male who comes to [facility] on 6/18 with severe ABD pain near the ribs. Upon arrival found to have cholelithiasis c cholecystitis. Pt underwent lap chole on 6/19. PMH: temporal arteritis, Shingles, HTN, GERD, Bladder CA, and HOH.   Clinical Impression  Pt admitted with above diagnosis. Pt currently with functional limitations due to the deficits listed below (see "PT Problem List"). Upon entry, the patient is received supine in bed, no family/caregiver present. Pt is asleep, moderate tactile stimulus require to arouse, but he maintains eyes closed as he answers questions about home setup and PLOF. Pt reports he is fully independent in ADL, IADL, and transportation at baseline, living alone. Per care management, pt has some PRN assistance from niece. After awakened, the patient remains somnolent, but agreeable to participate. He is unaware of bowel incontinence as STS transfer is initiated, but after assisted to [place], is able to make additional bowel movement and partially clean self: some assistance needed from NA due to looseness of stool. Pt complaining of lethargy throughout reporting "I'm high as a kite" and "I've never had morphine." Additional attempts at mobility demonstrate global instability, with several LOB forward, the patient requiring heavy physical assistance to return to bed. Pt received on on 2L O2, trial on RA for mobility with noted saturation of 89%, hence he is returned to 1LPM supplemental  at end of session, (RN notified, agreeable) whereas pt is not on O2 at baseline at home. Additional testing of functional strength withheld at this time due to lethargy. The patient is at high risk for falls as evidence by multiple LOB demonstrated throughout session. Pt will benefit from skilled PT intervention to increase  independence and safety with basic mobility in preparation for discharge to the venue listed below.       Follow Up Recommendations SNF;Supervision for mobility/OOB    Equipment Recommendations  Rolling walker with 5" wheels    Recommendations for Other Services       Precautions / Restrictions Precautions Precautions: Fall Restrictions Weight Bearing Restrictions: No      Mobility  Bed Mobility Overal bed mobility: Needs Assistance Bed Mobility: Supine to Sit;Sit to Supine     Supine to sit: Min assist Sit to supine: Modified independent (Device/Increase time)   General bed mobility comments: Some falling over toward the right, lethargic appearing similar to intoxication  Transfers Overall transfer level: Needs assistance Equipment used: Rolling walker (2 wheeled);1 person hand held assist Transfers: Sit to/from [facility] Sit to Stand: Max assist Stand pivot transfers: Max assist       General transfer comment: very poor truncal control, LOB forward multiple times, PT providing heavy physical assistance for stability. Pt reports "I'm high as a kite." (remains seated on BSC with generally good balance. )  Ambulation/Gait                Stairs            Wheelchair Mobility    Modified Rankin (Stroke Patients Only)       Balance Overall balance assessment: Needs assistance Sitting-balance support: Single extremity supported;Feet supported Sitting balance-Leahy Scale: Good     Standing balance support: During functional activity;Bilateral upper extremity supported Standing balance-Leahy Scale: Zero Standing balance comment: very poor truncal control, LOB forward multiple times, PT providing heavy physical assistance for stability.  Pt reports "I'm high as a kite."                             Pertinent Vitals/Pain Pain Assessment: No/denies pain    Home Living Family/patient expects to be discharged to:: Private  residence Living Arrangements: Alone Available Help at Discharge: Family Type of Home: Mobile home Home Access: Stairs to enter Entrance Stairs-Rails: Left;Right;Can reach both Entrance Stairs-Number of Steps: 2 Home Layout: One level Home Equipment: Environmental consultant - 2 wheels;Cane - single point;Bedside commode;Wheelchair - manual      Prior Function Level of Independence: Independent         Comments: Full yindependent in IADL, ADL, Still driving and AMB the community s AD      Hand Dominance        Extremity/Trunk Assessment        Lower Extremity Assessment Lower Extremity Assessment: Generalized weakness       Communication   Communication: No difficulties  Cognition Arousal/Alertness: Lethargic;Suspect due to medications Behavior During Therapy: [facility] for tasks assessed/performed Overall Cognitive Status: Difficult to assess                                        General Comments      Exercises     Assessment/Plan    PT Assessment Patient needs continued PT services  PT Problem List Decreased strength;Decreased safety awareness;Decreased balance;Decreased mobility;Decreased coordination       PT Treatment Interventions Gait training;DME instruction;Stair training;Functional mobility training;Therapeutic activities;Therapeutic exercise;Balance training;Patient/family education    PT Goals (Current goals can be found in the Care Plan section)  Acute Rehab PT Goals Patient Stated Goal: return to home with indep AMB and IADL.  PT Goal Formulation: With patient Time For Goal Achievement: 12/16/16 Potential to Achieve Goals: Fair    Frequency Min 2X/week   Barriers to discharge Decreased caregiver support;Inaccessible home environment      Co-evaluation               AM-PAC PT "6 Clicks" Daily Activity  Outcome Measure Difficulty turning over in bed (including adjusting bedclothes, sheets and blankets)?: Total Difficulty moving  from lying on back to sitting on the side of the bed? : Total Difficulty sitting down on and standing up from a chair with arms (e.g., wheelchair, bedside commode, etc,.)?: Total Help needed moving to and from a bed to chair (including a wheelchair)?: Total Help needed walking in hospital room?: Total Help needed climbing 3-5 steps with a railing? : Total 6 Click Score: 6    End of Session Equipment Utilized During Treatment: Gait belt;Oxygen Activity Tolerance: Patient limited by lethargy Patient left: in bed;with call bell/phone within reach;with bed alarm set;Other (comment) (4 bed rails elevated ) Nurse Communication: Mobility status (communicated patient's concerns of overmedication) PT Visit Diagnosis: Unsteadiness on feet (R26.81);Difficulty in walking, not elsewhere classified (R26.2);Muscle weakness (generalized) (M62.81)    Time: 8242-3536 PT Time Calculation (min) (ACUTE ONLY): 22 min   Charges:   PT Evaluation $PT Eval Low Complexity: 1 Procedure PT Treatments $Therapeutic Activity: 8-22 mins   PT G Codes:        1:11 PM, 12-08-2016 Etta Grandchild, PT, DPT Physical Therapist - [place] [phone] (Office)    Jordanny Waddington C 08-Dec-2016, 1:05 PM

## 2016-12-02 NOTE — Progress Notes (Signed)
PROGRESS NOTE    Ronald Russell  ZOX:[id number] DOB: 1934-09-18 DOA: 11/29/2016 PCP: Claretta Fraise, MD   Brief Narrative:  82 year old male with past medical history of anxiety, hypertension came to the ER with complaints of abdominal pain and was found to have acute cholecystitis secondary to cholelithiasis. Patient underwent laparoscopic cholecystectomy on 11/30/2016.   Assessment & Plan:   Principal Problem:   Acute cholecystitis Active Problems:   Hypertension   GAD (generalized anxiety disorder)   Renal insufficiency   Hyperglycemia   Thrombocytopenia (HCC)  Acute cholecystitis secondary to cholelithiasis status post laparoscopic cholecystectomy postop day #2 -Continue IV fluids. Monitor urine output -Oral diet as tolerated -Pain control -Out of bed to chair -Advised to use incentive spirometry. Given him instructions how to use it. -Cont Zosyn for now   Shortness of Breath with Hypoxia secondary to right sided moderate Pleural effusion  -Supplemental oxygen -Discontinue IV fluids at this time. Give Lasix 40 mg IV for next 3 doses and reassess tomorrow -Continue breathing treatments as needed. I have again urged him to continue using incentive spirometry -Monitor input and output. No need for pleural fluid drainage at this time but will closely monitor -Unlikely pneumonia but he is already on Zosyn.  Hypertension -Continue atenolol. Blood pressure was borderline low this morning but improving with hydration  Gen. anxiety disorder -Continue home dose of Ativan  Chronic kidney disease stage III -Creatinine appears to be stable at this time  Thrombocytopenia -Likely secondary to acute illness -Continue to monitor closely  Hyperglycemia -Hemoglobin A1c 5.3 -Continue Accu-Cheks and insulin sliding scale. No need for maintenance home  He will need PT/OT  DVT prophylaxis: Lovenox Code Status: Full Family Communication:   Spoke with his Caregiver  Mickel Baas Disposition Plan: Likely discharge in next 24-48 hours  Consultants:   Surgery  Procedures:   Laparoscopic cholecystectomy on 11/30/2016  Antimicrobials:   Zosyn   Subjective: Had episode of sob last night for which breathing treatment was given with minimal improvement. Unable to wean off O2 this morning. Gets hypoxix with ambulation therefore CXR done this morning showed right sided pleural effusion.  States his appetite is little better.   Objective: Vitals:   12/01/16 1901 12/01/16 2100 12/02/16 0654 12/02/16 0757  BP:  112/68 118/70   Pulse:  86 84   Resp:  20 20   Temp:  98.9 F (37.2 C) 98.4 F (36.9 C)   TempSrc:  Oral Oral   SpO2: 93% 97% 98% 94%  Weight:      Height:        Intake/Output Summary (Last 24 hours) at 12/02/16 1136 Last data filed at 12/02/16 0945  Gross per 24 hour  Intake              500 ml  Output              800 ml  Net             -300 ml   Filed Weights   11/29/16 1332 11/29/16 2019  Weight: 92.5 kg (204 lb) 89.4 kg (197 lb 1.5 oz)    Examination:  General exam: Appears calm and comfortable  Respiratory system: diffuse diminished bs especially on the right.  Cardiovascular system: S1 & S2 heard, RRR. No JVD, murmurs, rubs, gallops or clicks. No pedal edema. Gastrointestinal system: Abdomen is Mildly distended, soft and nontender. No organomegaly or masses felt. Normal bowel sounds heard.Pain upon palpation around the incision site otherwise dressing in  place and it's healing well. No signs of bleeding Central nervous system: Alert and oriented. No focal neurological deficits. Extremities: Symmetric 5 x 5 power. Skin: No rashes, lesions or ulcers Psychiatry: Judgement and insight appear normal. Mood & affect appropriate.     Data Reviewed:   CBC:  Recent Labs Lab 11/29/16 1508 11/30/16 0445 12/01/16 0547 12/02/16 0534  WBC 10.1 13.0* 12.8* 10.0  HGB 14.2 14.2 13.9 12.7*  HCT 42.0 42.0 41.4 37.6*  MCV 91.3  91.5 92.6 92.2  PLT 111* 113* 102* 735*   Basic Metabolic Panel:  Recent Labs Lab 11/29/16 1508 11/30/16 0445 12/01/16 0547 12/02/16 0534  NA 138 136 138 137  K 3.9 3.6 4.1 3.5  CL 106 103 103 102  CO2 24 23 26 27   GLUCOSE 119* 131* 108* 96  BUN 15 14 25* 26*  CREATININE 1.29* 1.21 1.89* 1.43*  CALCIUM 8.5* 8.3* 8.0* 7.8*   GFR: Estimated Creatinine Clearance: 45.2 mL/min (A) (by C-G formula based on SCr of 1.43 mg/dL (H)). Liver Function Tests:  Recent Labs Lab 11/29/16 1508 12/01/16 0547 12/02/16 0534  AST 18 45* 33  ALT 17 41 34  ALKPHOS 47 46 39  BILITOT 1.3* 1.7* 1.3*  PROT 6.4* 5.5* 5.4*  ALBUMIN 3.8 2.8* 2.6*    Recent Labs Lab 11/29/16 1508  LIPASE 24   No results for input(s): AMMONIA in the last 168 hours. Coagulation Profile: No results for input(s): INR, PROTIME in the last 168 hours. Cardiac Enzymes: No results for input(s): CKTOTAL, CKMB, CKMBINDEX, TROPONINI in the last 168 hours. BNP (last 3 results) No results for input(s): PROBNP in the last 8760 hours. HbA1C:  Recent Labs  11/30/16 0445  HGBA1C 5.3   CBG:  Recent Labs Lab 12/01/16 0739 12/01/16 1137 12/01/16 1648 12/01/16 2204 12/02/16 0821  GLUCAP 94 109* 91 110* 90   Lipid Profile: No results for input(s): CHOL, HDL, LDLCALC, TRIG, CHOLHDL, LDLDIRECT in the last 72 hours. Thyroid Function Tests: No results for input(s): TSH, T4TOTAL, FREET4, T3FREE, THYROIDAB in the last 72 hours. Anemia Panel: No results for input(s): VITAMINB12, FOLATE, FERRITIN, TIBC, IRON, RETICCTPCT in the last 72 hours. Sepsis Labs: No results for input(s): PROCALCITON, LATICACIDVEN in the last 168 hours.  Recent Results (from the past 240 hour(s))  Surgical PCR screen     Status: Abnormal   Collection Time: 11/30/16  8:28 AM  Result Value Ref Range Status   MRSA, PCR NEGATIVE NEGATIVE Final   Staphylococcus aureus POSITIVE (A) NEGATIVE Final    Comment:        The Xpert SA Assay  (FDA approved for NASAL specimens in patients over 50 years of age), is one component of a comprehensive surveillance program.  Test performance has been validated by [facility] for patients greater than or equal to 11 year old. It is not intended to diagnose infection nor to guide or monitor treatment. RESULT CALLED TO, READ BACK BY AND VERIFIED WITH: HOBBS A. AT 1051A ON [id number] BY THOMPSON S.          Radiology Studies: Dg Chest Port 1 View  Result Date: 12/02/2016 CLINICAL DATA:  Hypoxia.  Urinary bladder carcinoma EXAM: PORTABLE CHEST 1 VIEW COMPARISON:  08/29/2016 FINDINGS: There is pleural effusion with consolidation in the right base. A small left pleural effusion with left base atelectasis noted. Lungs elsewhere clear. There is stable cardiomegaly with pulmonary vascularity within normal limits. There is aortic atherosclerosis. No evident adenopathy. No appreciable bone lesions evident.  IMPRESSION: Moderate pleural effusion on the right with minimal pleural effusion on the left. Airspace consolidation in the right base, probably at least in part due to compressive atelectasis but with questionable superimposed pneumonia. Slight atelectasis left base. Stable cardiomegaly. Aortic atherosclerosis. Electronically Signed   By: Lowella Grip III M.D.   On: 12/02/2016 09:27        Scheduled Meds: . atenolol  50 mg Oral Daily  . Chlorhexidine Gluconate Cloth  6 each Topical Daily  . colchicine  0.6 mg Oral BID  . enoxaparin (LOVENOX) injection  40 mg Subcutaneous Q24H  . febuxostat  40 mg Oral Daily  . furosemide  40 mg Intravenous BID  . insulin aspart  0-5 Units Subcutaneous QHS  . insulin aspart  0-9 Units Subcutaneous TID WC  . loratadine  10 mg Oral Daily  . LORazepam  1 mg Intravenous Q6H  . magnesium hydroxide  30 mL Oral BID  . mupirocin ointment  1 application Nasal BID  . pantoprazole  40 mg Oral Daily  . pravastatin  40 mg Oral q1800   Continuous  Infusions: . piperacillin-tazobactam (ZOSYN)  IV 3.375 g (12/02/16 0945)     LOS: 3 days    Time spent: 35 mins     Tomothy Eddins Arsenio Loader, MD [facility] Pager [phone]   If 7PM-7AM, please contact night-coverage [email or web address] Password [facility] 12/02/2016, 11:36 AM

## 2016-12-02 NOTE — Progress Notes (Signed)
Pharmacy Antibiotic Note  Ronald Russell is a 82 y.o. male admitted on 11/29/2016 with Intra-abdominal Infection.  Pharmacy has been consulted for Zosyn dosing. Augmentin intolerance noted (diarrhea) received Zosyn in ED. Pt s/p lap cholecystecomy on 6/19. Antibiotics continued for now. WBC has normalized, Tmax 99.1.   Plan: Continue Zosyn 3.375g IV q8h (4 hour infusion). Monitor labs, micro and vitals.  Deescalate tx as indicated  Height: 6' (182.9 cm) Weight: 197 lb 1.5 oz (89.4 kg) IBW/kg (Calculated) : 77.6  Temp (24hrs), Avg:98.8 F (37.1 C), Min:98.4 F (36.9 C), Max:99.1 F (37.3 C)   Recent Labs Lab 11/29/16 1508 11/30/16 0445 12/01/16 0547 12/02/16 0534  WBC 10.1 13.0* 12.8* 10.0  CREATININE 1.29* 1.21 1.89* 1.43*    Estimated Creatinine Clearance: 45.2 mL/min (A) (by C-G formula based on SCr of 1.43 mg/dL (H)).    Allergies  Allergen Reactions  . Augmentin [Amoxicillin-Pot Clavulanate] Diarrhea  . Ciprofloxacin Nausea Only  . Codeine Other (See Comments)    Severe headache, nausea and vomiting  . Oxycodone Nausea And Vomiting  . Prednisone Other (See Comments)    hyperactivity  . Vicodin [Hydrocodone-Acetaminophen] Nausea And Vomiting    Antimicrobials this admission: Zosyn 6/18 >>   Dose adjustments this admission: n/a   Microbiology results: 6/19MRSA PCR: +  Thank you for allowing pharmacy to be a part of this patient's care.  Isac Sarna, BS [place], [place] Clinical Pharmacist Pager [phone] 12/02/2016 12:44 PM

## 2016-12-02 NOTE — Progress Notes (Signed)
2 Days Post-Op  Subjective: Patient feels fatigued. His abdominal pain has significantly decreased. He has had a bowel movement.  Objective: Vital signs in last 24 hours: Temp:  [98.4 F (36.9 C)-99.1 F (37.3 C)] 98.4 F (36.9 C) (06/21 0654) Pulse Rate:  [78-86] 84 (06/21 0654) Resp:  [20] 20 (06/21 0654) BP: (107-118)/(49-70) 118/70 (06/21 0654) SpO2:  [93 %-98 %] 94 % (06/21 0757) Last BM Date: 12/02/16  Intake/Output from previous day: 06/20 0701 - 06/21 0700 In: 450 [P.O.:300; IV Piggyback:150] Out: 500 [Urine:500] Intake/Output this shift: Total I/O In: 170 [P.O.:120; IV Piggyback:50] Out: 300 [Urine:300]  General appearance: alert, cooperative and fatigued GI: Soft, incisions healing well.  Lab Results:   Recent Labs  12/01/16 0547 12/02/16 0534  WBC 12.8* 10.0  HGB 13.9 12.7*  HCT 41.4 37.6*  PLT 102* 105*   BMET  Recent Labs  12/01/16 0547 12/02/16 0534  NA 138 137  K 4.1 3.5  CL 103 102  CO2 26 27  GLUCOSE 108* 96  BUN 25* 26*  CREATININE 1.89* 1.43*  CALCIUM 8.0* 7.8*   PT/INR No results for input(s): LABPROT, INR in the last 72 hours.  Studies/Results: Dg Chest Port 1 View  Result Date: 12/02/2016 CLINICAL DATA:  Hypoxia.  Urinary bladder carcinoma EXAM: PORTABLE CHEST 1 VIEW COMPARISON:  08/29/2016 FINDINGS: There is pleural effusion with consolidation in the right base. A small left pleural effusion with left base atelectasis noted. Lungs elsewhere clear. There is stable cardiomegaly with pulmonary vascularity within normal limits. There is aortic atherosclerosis. No evident adenopathy. No appreciable bone lesions evident. IMPRESSION: Moderate pleural effusion on the right with minimal pleural effusion on the left. Airspace consolidation in the right base, probably at least in part due to compressive atelectasis but with questionable superimposed pneumonia. Slight atelectasis left base. Stable cardiomegaly. Aortic atherosclerosis.  Electronically Signed   By: Lowella Grip III M.D.   On: 12/02/2016 09:27    Anti-infectives: Anti-infectives    Start     Dose/Rate Route Frequency Ordered Stop   11/30/16 0100  piperacillin-tazobactam (ZOSYN) IVPB 3.375 g     3.375 g 12.5 mL/hr over 240 Minutes Intravenous Every 8 hours 11/29/16 2114     11/29/16 1745  piperacillin-tazobactam (ZOSYN) IVPB 3.375 g     3.375 g 100 mL/hr over 30 Minutes Intravenous  Once 11/29/16 1730 11/29/16 1848      Assessment/Plan: s/p Procedure(s): LAPAROSCOPIC CHOLECYSTECTOMY Impression: Patient's somewhat fatigued today. He states he doesn't have enough energy to ambulate. He states he may be slightly short of breath. From the cholecystectomy standpoint, he appears to be healing well. I have encouraged him to increase his ambulation.  LOS: 3 days    [facility] Signs 12/02/2016

## 2016-12-02 NOTE — Progress Notes (Signed)
Pt is found to be very lethargic after scheduled dose of IV Ativan given. Dr. Reesa Chew paged and made aware. New order received to discontinue Ativan completely. Will continue to monitor patient.

## 2016-12-03 LAB — BASIC METABOLIC PANEL
Anion gap: 10 (ref 5–15)
BUN: 26 mg/dL — ABNORMAL HIGH (ref 6–20)
CO2: 31 mmol/L (ref 22–32)
Calcium: 8.1 mg/dL — ABNORMAL LOW (ref 8.9–10.3)
Chloride: 97 mmol/L — ABNORMAL LOW (ref 101–111)
Creatinine, Ser: 1.59 mg/dL — ABNORMAL HIGH (ref 0.61–1.24)
GFR calc Af Amer: 46 mL/min — ABNORMAL LOW (ref >60)
GFR calc non Af Amer: 39 mL/min — ABNORMAL LOW (ref >60)
Glucose, Bld: 115 mg/dL — ABNORMAL HIGH (ref 65–99)
Potassium: 3.2 mmol/L — ABNORMAL LOW (ref 3.5–5.1)
Sodium: 138 mmol/L (ref 135–145)

## 2016-12-03 LAB — COMPREHENSIVE METABOLIC PANEL
ALT: 29 U/L (ref 17–63)
AST: 23 U/L (ref 15–41)
Albumin: 2.7 g/dL — ABNORMAL LOW (ref 3.5–5.0)
Alkaline Phosphatase: 46 U/L (ref 38–126)
Anion gap: 9 (ref 5–15)
BUN: 27 mg/dL — ABNORMAL HIGH (ref 6–20)
CO2: 32 mmol/L (ref 22–32)
Calcium: 7.9 mg/dL — ABNORMAL LOW (ref 8.9–10.3)
Chloride: 96 mmol/L — ABNORMAL LOW (ref 101–111)
Creatinine, Ser: 1.65 mg/dL — ABNORMAL HIGH (ref 0.61–1.24)
GFR calc Af Amer: 44 mL/min — ABNORMAL LOW (ref >60)
GFR calc non Af Amer: 38 mL/min — ABNORMAL LOW (ref >60)
Glucose, Bld: 94 mg/dL (ref 65–99)
Potassium: 2.9 mmol/L — ABNORMAL LOW (ref 3.5–5.1)
Sodium: 137 mmol/L (ref 135–145)
Total Bilirubin: 0.8 mg/dL (ref 0.3–1.2)
Total Protein: 5.9 g/dL — ABNORMAL LOW (ref 6.5–8.1)

## 2016-12-03 LAB — CBC
HCT: 37.1 % — ABNORMAL LOW (ref 39.0–52.0)
Hemoglobin: 12.6 g/dL — ABNORMAL LOW (ref 13.0–17.0)
MCH: 30.9 pg (ref 26.0–34.0)
MCHC: 34 g/dL (ref 30.0–36.0)
MCV: 90.9 fL (ref 78.0–100.0)
Platelets: 121 10*3/uL — ABNORMAL LOW (ref 150–400)
RBC: 4.08 MIL/uL — ABNORMAL LOW (ref 4.22–5.81)
RDW: 13.9 % (ref 11.5–15.5)
WBC: 8.8 10*3/uL (ref 4.0–10.5)

## 2016-12-03 LAB — MAGNESIUM: Magnesium: 2.2 mg/dL (ref 1.7–2.4)

## 2016-12-03 LAB — GLUCOSE, CAPILLARY
Glucose-Capillary: 120 mg/dL — ABNORMAL HIGH (ref 65–99)
Glucose-Capillary: 128 mg/dL — ABNORMAL HIGH (ref 65–99)
Glucose-Capillary: 88 mg/dL (ref 65–99)

## 2016-12-03 MED ORDER — POTASSIUM CHLORIDE 10 MEQ/100ML IV SOLN
10.0000 meq | INTRAVENOUS | Status: AC
  Administered 2016-12-03 (×2): 10 meq via INTRAVENOUS
  Filled 2016-12-03 (×2): qty 100

## 2016-12-03 MED ORDER — FUROSEMIDE 10 MG/ML IJ SOLN
40.0000 mg | Freq: Once | INTRAMUSCULAR | Status: AC
  Administered 2016-12-03: 40 mg via INTRAVENOUS
  Filled 2016-12-03: qty 4

## 2016-12-03 MED ORDER — POTASSIUM CHLORIDE CRYS ER 20 MEQ PO TBCR
40.0000 meq | EXTENDED_RELEASE_TABLET | Freq: Two times a day (BID) | ORAL | Status: AC
  Administered 2016-12-03 (×2): 40 meq via ORAL
  Filled 2016-12-03 (×2): qty 2

## 2016-12-03 MED ORDER — IPRATROPIUM-ALBUTEROL 0.5-2.5 (3) MG/3ML IN SOLN
3.0000 mL | Freq: Three times a day (TID) | RESPIRATORY_TRACT | Status: DC
  Administered 2016-12-03 – 2016-12-06 (×7): 3 mL via RESPIRATORY_TRACT
  Filled 2016-12-03 (×12): qty 3

## 2016-12-03 NOTE — Clinical Social Work Note (Signed)
Clinical Social Work Assessment  Patient Details  Name: Ronald Russell MRN: [id number] Date of Birth: 08-17-1934  Date of referral:  12/03/16               Reason for consult:  Facility Placement, Discharge Planning                Permission sought to share information with:  Case Manager, Customer service manager, Family Supports Permission granted to share information::  Yes, Verbal Permission Granted  Name::        Agency::     Relationship::  [facility] Information:     Housing/Transportation Living arrangements for the past 2 months:  [place] of Information:  Patient, Medical Team, Case Manager, Other (Comment Required) (other family members) Patient Interpreter Needed:  None Criminal Activity/Legal Involvement Pertinent to Current Situation/Hospitalization:  No - Comment as needed Significant Relationships:  Other Family Members, Community Support Lives with:  Self Do you feel safe going back to the place where you live?  No Need for family participation in patient care:  Yes (Comment)  Care giving concerns:  [facility] and completed surgery successfully, however has been more confused and not at baseline.  Prior to admission, patient was dancing weekly, singing in nursing facilities, and independent at home with ADLs.  Niece reports at this time patient is able to come home with her or she could stay with him, however he is very lethargic and requiring max assist and she reports it will just be her and would feel better if he completed ST rehab for a couple of weeks and then returned home.  Patient is confused and showing evidence of hospital delirium, but no behaviors. He is improving each day, just at a slow rate.  SNF being recommended and agreeable by family.   Social Worker assessment / plan:  Assessment completed and SNF work up completed. Will follow up with bed offers. Will call patient insurance to being process  for authorization for SNF Fl2 completed.  Family preference is to remain in [place].  Possible [facility] or [facility].  Employment status:  Retired Nurse, adult PT Recommendations:  [place], [place] / Referral to community resources:  [place]  Patient/Family's Response to care:  UNderstanding  Patient/Family's Understanding of and Emotional Response to Diagnosis, Current Treatment, and Prognosis:  Niece very involved in patient care and agreeable to plan of care and recommendations of SNF.  Emotional Assessment Appearance:  Appears stated age Attitude/Demeanor/Rapport:  Other (still having confusion) Affect (typically observed):  Accepting, Adaptable Orientation:  Oriented to Self (at baseline, no confusion) Alcohol / Substance use:  Not Applicable Psych involvement (Current and /or in the community):  No (Comment)  Discharge Needs  Concerns to be addressed:  No discharge needs identified Readmission within the last 30 days:  No Current discharge risk:  None Barriers to Discharge:  Continued Medical Work up, [place], [place], [place] 12/03/2016, 10:58 AM

## 2016-12-03 NOTE — [place] FL2 (Signed)
[place] MEDICAID FL2 LEVEL OF CARE SCREENING TOOL     IDENTIFICATION  Patient Name: Ronald Russell Birthdate: 26-Jun-1934 Sex: male Admission Date (Current Location): 11/29/2016  [facility] and [place] Number:  [employer] and Address:  [place] [address], [place]      Provider Number: [id number]  Attending Physician Name and Address:  Damita Lack, MD  Relative Name and Phone Number:       Current Level of Care: Hospital Recommended Level of Care: [place] Prior Approval Number:    Date Approved/Denied:   PASRR Number:   [id number]  Discharge Plan: SNF    Current Diagnoses: Patient Active Problem List   Diagnosis Date Noted  . Acute cholecystitis 11/29/2016  . Hyperglycemia 11/29/2016  . Thrombocytopenia ([place]) 11/29/2016  . Palpitations 09/01/2016  . Renal insufficiency 09/01/2016  . HLD (hyperlipidemia) 09/01/2016  . Hypokalemia 09/01/2016  . Localized swelling of lower extremity 09/01/2016  . Gout 05/20/2016  . Post herpetic neuralgia 05/20/2016  . GAD (generalized anxiety disorder) 01/27/2016  . Vitamin D deficiency 11/06/2015  . BPPV (benign paroxysmal positional vertigo) 09/30/2015  . Immune deficiency disorder ([place]) 08/19/2015  . Temporal arteritis ([place]) 04/18/2013  . GERD (gastroesophageal reflux disease)   . Hypertension   . HOH (hard of hearing)   . Symptomatic PVCs 07/31/2011    Orientation RESPIRATION BLADDER Height & Weight     Self, Situation, Place  O2 (2L while in hospital, not using at home.) Continent Weight: 196 lb 13.9 oz (89.3 kg) Height:  6' (182.9 cm)  BEHAVIORAL SYMPTOMS/MOOD NEUROLOGICAL BOWEL NUTRITION STATUS      Continent Diet (regular diet)  AMBULATORY STATUS COMMUNICATION OF NEEDS Skin   Extensive Assist Verbally Surgical wounds, Skin abrasions, Bruising                       Personal Care Assistance Level of Assistance  Bathing, Feeding, Dressing  Bathing Assistance: Maximum assistance Feeding assistance: Limited assistance Dressing Assistance: Maximum assistance     Functional Limitations Info  Sight, Hearing, Speech Sight Info: Adequate Hearing Info: Adequate Speech Info: Adequate    SPECIAL CARE FACTORS FREQUENCY  PT (By licensed PT), OT (By licensed OT)     PT Frequency: 5x OT Frequency: 5x            Contractures Contractures Info: Not present    Additional Factors Info  Code Status, Allergies Code Status Info: Full Code Allergies Info: Augmentin Amoxicillin-pot Clavulanate, Ciprofloxacin, Codeine, Oxycodone, Prednisone, Vicodin Hydrocodone-acetaminophen           Current Medications (12/03/2016):  This is the current hospital active medication list Current Facility-Administered Medications  Medication Dose Route Frequency Provider Last Rate Last Dose  . acetaminophen (TYLENOL) tablet 650 mg  650 mg Oral Q6H PRN Karmen Bongo, MD       Or  . acetaminophen (TYLENOL) suppository 650 mg  650 mg Rectal Q6H PRN Karmen Bongo, MD      . atenolol (TENORMIN) tablet 50 mg  50 mg Oral Daily Karmen Bongo, MD   50 mg at 12/03/16 0819  . Chlorhexidine Gluconate Cloth 2 % PADS 6 each  6 each Topical Daily Amin, Jeanella Flattery, MD   6 each at 12/03/16 1000  . colchicine tablet 0.6 mg  0.6 mg Oral BID Karmen Bongo, MD   0.6 mg at 12/03/16 0819  . enoxaparin (LOVENOX) injection 40 mg  40 mg Subcutaneous Q24H Arnoldo Morale,  Elta Guadeloupe, MD   40 mg at 12/03/16 0819  . febuxostat (ULORIC) tablet 40 mg  40 mg Oral Daily Karmen Bongo, MD   40 mg at 12/03/16 0819  . fentaNYL (SUBLIMAZE) injection 25 mcg  25 mcg Intravenous Q1H PRN Karmen Bongo, MD   25 mcg at 11/30/16 1625  . furosemide (LASIX) injection 40 mg  40 mg Intravenous Once Amin, Ankit Chirag, MD      . insulin aspart (novoLOG) injection 0-5 Units  0-5 Units Subcutaneous QHS Memon, Jehanzeb, MD      . insulin aspart (novoLOG) injection 0-9 Units  0-9 Units Subcutaneous  TID WC Memon, Jehanzeb, MD      . ipratropium-albuterol (DUONEB) 0.5-2.5 (3) MG/3ML nebulizer solution 3 mL  3 mL Nebulization Q4H PRN Amin, Ankit Chirag, MD   3 mL at 12/02/16 1734  . ipratropium-albuterol (DUONEB) 0.5-2.5 (3) MG/3ML nebulizer solution 3 mL  3 mL Nebulization TID Amin, Ankit Chirag, MD   3 mL at 12/03/16 0744  . loratadine (CLARITIN) tablet 10 mg  10 mg Oral Daily Karmen Bongo, MD   10 mg at 12/03/16 0819  . magnesium hydroxide (MILK OF MAGNESIA) suspension 30 mL  30 mL Oral BID Aviva Signs, MD   30 mL at 12/01/16 1120  . morphine 2 MG/ML injection 2 mg  2 mg Intravenous Q2H PRN Aviva Signs, MD   2 mg at 12/01/16 1830  . mupirocin ointment (BACTROBAN) 2 % 1 application  1 application Nasal BID Damita Lack, MD   1 application at 94/70/96 0818  . ondansetron (ZOFRAN) injection 4 mg  4 mg Intravenous Q6H PRN Karmen Bongo, MD   4 mg at 12/01/16 1123  . pantoprazole (PROTONIX) EC tablet 40 mg  40 mg Oral Daily Karmen Bongo, MD   40 mg at 12/03/16 0819  . piperacillin-tazobactam (ZOSYN) IVPB 3.375 g  3.375 g Intravenous Lynne Logan, MD 12.5 mL/hr at 12/03/16 0819 3.375 g at 12/03/16 0819  . potassium chloride 10 mEq in 100 mL IVPB  10 mEq Intravenous Q1 Hr x 2 Amin, Ankit Chirag, MD 100 mL/hr at 12/03/16 1009 10 mEq at 12/03/16 1009  . potassium chloride SA (K-DUR,KLOR-CON) CR tablet 40 mEq  40 mEq Oral BID Damita Lack, MD   40 mEq at 12/03/16 0921  . pravastatin (PRAVACHOL) tablet 40 mg  40 mg Oral q1800 Karmen Bongo, MD   40 mg at 12/02/16 1714  . simethicone (MYLICON) chewable tablet 40 mg  40 mg Oral Q6H PRN Aviva Signs, MD      . traMADol Veatrice Bourbon) tablet 50 mg  50 mg Oral Q6H PRN Aviva Signs, MD   50 mg at 12/02/16 2219     Discharge Medications: Please see discharge summary for a list of discharge medications.  Relevant Imaging Results:  Relevant Lab Results:   Additional Information SSN:  [id number]  Lilly Cove,  [place]

## 2016-12-03 NOTE — Clinical Social Work Placement (Signed)
   CLINICAL SOCIAL WORK PLACEMENT  NOTE  Date:  12/03/2016  Patient Details  Name: Ronald Russell MRN: [id number] Date of Birth: [birth year]  Clinical Social Work is seeking post-discharge placement for this patient at the [place] level of care (*CSW will initial, date and re-position this form in  chart as items are completed):  Yes   Patient/family provided with [place] Work Department's list of facilities offering this level of care within the geographic area requested by the patient (or if unable, by the patient's family).  Yes   Patient/family informed of their freedom to choose among providers that offer the needed level of care, that participate in Medicare, Medicaid or managed care program needed by the patient, have an available bed and are willing to accept the patient.  Yes   Patient/family informed of [place]'s ownership interest in [facility] and [facility], as well as of the fact that they are under no obligation to receive care at these facilities.  PASRR submitted to EDS on 12/03/16     PASRR number received on 12/03/16     Existing PASRR number confirmed on       FL2 transmitted to all facilities in geographic area requested by pt/family on 12/03/16     FL2 transmitted to all facilities within larger geographic area on       Patient informed that his/her managed care company has contracts with or will negotiate with certain facilities, including the following:            Patient/family informed of bed offers received.  Patient chooses bed at       Physician recommends and patient chooses bed at      Patient to be transferred to   on  .  Patient to be transferred to facility by       Patient family notified on   of transfer.  Name of family member notified:        PHYSICIAN Please sign FL2     Additional Comment:    _______________________________________________ Lilly C[place], LCSW 12/03/2016,  11:01 AM

## 2016-12-03 NOTE — Evaluation (Signed)
Occupational Therapy Evaluation Patient Details Name: Ronald Russell MRN: [id number] DOB: 1934/11/06 Today's Date: 12/03/2016    History of Present Illness Ronald Russell is an 82yo white male who comes to [facility] on 6/18 with severe ABD pain near the ribs. Upon arrival found to have cholelithiasis c cholecystitis. Pt underwent lap chole on 6/19. PMH: temporal arteritis, Shingles, HTN, GERD, Bladder CA, and HOH.    Clinical Impression   Pt received semi-reclined in bed, nursing in room, pt agreeable to OT evaluation. Pt alert this am, conversing with staff and answering questions. Pt demonstrates mod independence with bed mobility this am, requiring max assist to donn socks/shoes due to incisional pain. Pt impulsive this session, following commands inconsistently and unaware of safety concerns. OT consistently providing verbal cuing for safety and sequencing. During standing tasks pt requiring min guard for balance. SpO2 monitored throughout session, O2 at 97% while seated at EOB on 2L O2, dropping to 87% when O2 removed. O2 replaced and pt maintained 92-96% throughout remainder of session, HR maintaining 78-83 during session. Pt reports he has no family available 24/7 to provide supervision, therefore due to safety concerns and cognitive status recommend SNF on discharge for supervision. No further OT services required at this time, defer to PT for rehab needs.     Follow Up Recommendations  Supervision/Assistance - 24 hour;SNF    Equipment Recommendations  None recommended by OT       Precautions / Restrictions Precautions Precautions: Fall Restrictions Weight Bearing Restrictions: No      Mobility Bed Mobility Overal bed mobility: Modified Independent                Transfers Overall transfer level: Needs assistance Equipment used: Rolling walker (2 wheeled) Transfers: Sit to/from Stand Sit to Stand: Min assist         General transfer comment: Cuing to push up from bed         ADL either performed or assessed with clinical judgement   ADL Overall ADL's : Needs assistance/impaired Eating/Feeding: Modified independent;Bed level   Grooming: Wash/dry hands;Wash/dry face;Min guard;Standing               Lower Body Dressing: Maximal assistance;Sitting/lateral leans Lower Body Dressing Details (indicate cue type and reason): Max A due to incision pain             Functional mobility during ADLs: Minimal assistance;Cueing for safety;Cueing for sequencing;Rolling walker       Vision Baseline Vision/History: Wears glasses Wears Glasses: At all times Patient Visual Report: No change from baseline Vision Assessment?: No apparent visual deficits            Pertinent Vitals/Pain Pain Assessment: 0-10 Pain Score: 2  Pain Location: incision Pain Descriptors / Indicators: Sore Pain Intervention(s): Limited activity within patient's tolerance;Monitored during session;Repositioned     Hand Dominance Right   Extremity/Trunk Assessment Upper Extremity Assessment Upper Extremity Assessment: Overall WFL for tasks assessed   Lower Extremity Assessment Lower Extremity Assessment: Defer to PT evaluation       Communication Communication Communication: No difficulties   Cognition Arousal/Alertness: Awake/alert Behavior During Therapy: Impulsive Overall Cognitive Status: Impaired/Different from baseline Area of Impairment: Safety/judgement;Following commands                       Following Commands: Follows one step commands inconsistently Safety/Judgement: Decreased awareness of safety;Decreased awareness of deficits  Home Living   Living Arrangements: Alone Available Help at Discharge: Family Type of Home: Mobile home Home Access: Stairs to enter Entrance Stairs-Number of Steps: 2 Entrance Stairs-Rails: Left;Right;Can reach both Home Layout: One level               Home Equipment: Walker - 2  wheels;Cane - single point;Bedside commode;Wheelchair - manual          Prior Functioning/Environment Level of Independence: Independent        Comments: Fully independent in IADL, ADL, Still driving and AMB the community s AD         OT Problem List: Decreased activity tolerance;Impaired balance (sitting and/or standing);Decreased safety awareness;Decreased cognition;Decreased knowledge of use of DME or AE       AM-PAC PT "6 Clicks" Daily Activity     Outcome Measure Help from another person eating meals?: None Help from another person taking care of personal grooming?: None Help from another person toileting, which includes using toliet, bedpan, or urinal?: A Little Help from another person bathing (including washing, rinsing, drying)?: A Little Help from another person to put on and taking off regular upper body clothing?: None Help from another person to put on and taking off regular lower body clothing?: A Little 6 Click Score: 21   End of Session Equipment Utilized During Treatment: Gait belt;Rolling walker  Activity Tolerance: Patient tolerated treatment well Patient left: in chair;with call bell/phone within reach  OT Visit Diagnosis: Muscle weakness (generalized) (M62.81)                Time: 8309-4076 OT Time Calculation (min): 46 min Charges:  OT General Charges $OT Visit: 1 Procedure OT Evaluation $OT Eval Moderate Complexity: 1 Procedure    Guadelupe Sabin, OTR/L  [phone] 12/03/2016, 9:41 AM

## 2016-12-03 NOTE — Progress Notes (Addendum)
PROGRESS NOTE    Ronald Russell  ZOX:[id number] DOB: 09-Aug-1934 DOA: 11/29/2016 PCP: Claretta Fraise, MD   Brief Narrative:  82 year old male with past medical history of anxiety, hypertension came to the ER with complaints of abdominal pain and was found to have acute cholecystitis secondary to cholelithiasis. Patient underwent laparoscopic cholecystectomy on 11/30/2016.   Assessment & Plan:   Principal Problem:   Acute cholecystitis Active Problems:   Hypertension   GAD (generalized anxiety disorder)   Renal insufficiency   Hyperglycemia   Thrombocytopenia (HCC)  Acute cholecystitis secondary to cholelithiasis status post laparoscopic cholecystectomy postop day #3 -Monitor urine output -Oral diet as tolerated; antiemetics if needed  -Pain control; which has much improved now -Out of bed to chair -Advised to use incentive spirometry. Given him instructions how to use it. -Cont Zosyn for now; can be discontinued at the time of his discharge.   Shortness of Breath with Hypoxia secondary to right sided moderate Pleural effusion; slightly improved  -Supplemental oxygen. Normal Echo from 09/2016 -s/p 3 doses of lasix 40mg  IV, I will give him one more dose today and repeat CXR tomorrow am. Monitor lytes closely.  -Continue breathing treatments as needed. I have again urged him to continue using incentive spirometry -Monitor input and output. No need for pleural fluid drainage at this time but will closely monitor -Unlikely pneumonia but he is already on Zosyn.  Hypokalemia -Likely secondary to diuretic use. We'll replete with oral and IV at this time -Recheck BMP and magnesium level around 1 PM and replete as necessary.  Hypertension; improved  -Continue atenolol. Blood pressure was borderline low this morning but improving with hydration  Gen. anxiety disorder -Ativan discontinued as it was making him very drowsy yesterday. Closely monitor. Can give low-dose by mouth if it  becomes necessary  Chronic kidney disease stage III -Creatinine appears to be stable at this time  Thrombocytopenia -Likely secondary to acute illness -Continue to monitor closely  Hyperglycemia -Hemoglobin A1c 5.3 -Continue Accu-Cheks and insulin sliding scale. No need for maintenance home  PT/OT recommending SNF; but not sure if he will completely agree with it at the time of discharge.   DVT prophylaxis: Lovenox Code Status: Full Family Communication:   None at [facility].  Disposition Plan: Likely discharge in next 24-48 hours  Consultants:   Surgery  Procedures:   Laparoscopic cholecystectomy on 11/30/2016  Antimicrobials:   Zosyn   Subjective: Patient states he feels much better in terms of breathing this morning. No acute events overnight. He did have episodes of drowsiness yesterday therefore taken off of Ativan.  Objective: Vitals:   12/02/16 1948 12/02/16 2141 12/03/16 0624 12/03/16 0754  BP:  120/77 118/64   Pulse: 76 77 67   Resp: 18 16 16    Temp:  98.2 F (36.8 C) 98.2 F (36.8 C)   TempSrc:  Oral Oral   SpO2: 90% 92% 93% 95%  Weight:   89.3 kg (196 lb 13.9 oz)   Height:        Intake/Output Summary (Last 24 hours) at 12/03/16 1004 Last data filed at 12/03/16 0624  Gross per 24 hour  Intake              340 ml  Output             2800 ml  Net            -2460 ml   Filed Weights   11/29/16 1332 11/29/16 2019 12/03/16 0624  Weight:  92.5 kg (204 lb) 89.4 kg (197 lb 1.5 oz) 89.3 kg (196 lb 13.9 oz)    Examination:  General exam: Appears calm and comfortable  Respiratory system: diffuse diminished bs especially on the right. Improved  Cardiovascular system: S1 & S2 heard, RRR. No JVD, murmurs, rubs, gallops or clicks. No pedal edema. Gastrointestinal system: Abdomen is Mildly distended, soft and nontender. No organomegaly or masses felt. Normal bowel sounds heard.Pain upon palpation around the incision site otherwise dressing in place and it's  healing well. No signs of bleeding Central nervous system: Alert and oriented. No focal neurological deficits. Extremities: Symmetric 5 x 5 power. Skin: No rashes, lesions or ulcers Psychiatry: Judgement and insight appear normal. Mood & affect appropriate.     Data Reviewed:   CBC:  Recent Labs Lab 11/30/16 0445 12/01/16 0547 12/02/16 0534 12/02/16 1629 12/03/16 0624  WBC 13.0* 12.8* 10.0 10.5 8.8  HGB 14.2 13.9 12.7* 12.7* 12.6*  HCT 42.0 41.4 37.6* 37.7* 37.1*  MCV 91.5 92.6 92.2 90.8 90.9  PLT 113* 102* 105* 119* 786*   Basic Metabolic Panel:  Recent Labs Lab 11/30/16 0445 12/01/16 0547 12/02/16 0534 12/02/16 1629 12/03/16 0624  NA 136 138 137 137 137  K 3.6 4.1 3.5 3.3* 2.9*  CL 103 103 102 99* 96*  CO2 23 26 27 28  32  GLUCOSE 131* 108* 96 109* 94  BUN 14 25* 26* 24* 27*  CREATININE 1.21 1.89* 1.43* 1.55* 1.65*  CALCIUM 8.3* 8.0* 7.8* 8.2* 7.9*   GFR: Estimated Creatinine Clearance: 39.2 mL/min (A) (by C-G formula based on SCr of 1.65 mg/dL (H)). Liver Function Tests:  Recent Labs Lab 11/29/16 1508 12/01/16 0547 12/02/16 0534 12/03/16 0624  AST 18 45* 33 23  ALT 17 41 34 29  ALKPHOS 47 46 39 46  BILITOT 1.3* 1.7* 1.3* 0.8  PROT 6.4* 5.5* 5.4* 5.9*  ALBUMIN 3.8 2.8* 2.6* 2.7*    Recent Labs Lab 11/29/16 1508  LIPASE 24   No results for input(s): AMMONIA in the last 168 hours. Coagulation Profile: No results for input(s): INR, PROTIME in the last 168 hours. Cardiac Enzymes: No results for input(s): CKTOTAL, CKMB, CKMBINDEX, TROPONINI in the last 168 hours. BNP (last 3 results) No results for input(s): PROBNP in the last 8760 hours. HbA1C: No results for input(s): HGBA1C in the last 72 hours. CBG:  Recent Labs Lab 12/02/16 0821 12/02/16 1144 12/02/16 1613 12/02/16 2139 12/03/16 0812  GLUCAP 90 96 99 110* 120*   Lipid Profile: No results for input(s): CHOL, HDL, LDLCALC, TRIG, CHOLHDL, LDLDIRECT in the last 72 hours. Thyroid  Function Tests: No results for input(s): TSH, T4TOTAL, FREET4, T3FREE, THYROIDAB in the last 72 hours. Anemia Panel: No results for input(s): VITAMINB12, FOLATE, FERRITIN, TIBC, IRON, RETICCTPCT in the last 72 hours. Sepsis Labs: No results for input(s): PROCALCITON, LATICACIDVEN in the last 168 hours.  Recent Results (from the past 240 hour(s))  Surgical PCR screen     Status: Abnormal   Collection Time: 11/30/16  8:28 AM  Result Value Ref Range Status   MRSA, PCR NEGATIVE NEGATIVE Final   Staphylococcus aureus POSITIVE (A) NEGATIVE Final    Comment:        The Xpert SA Assay (FDA approved for NASAL specimens in patients over 79 years of age), is one component of a comprehensive surveillance program.  Test performance has been validated by [facility] for patients greater than or equal to 51 year old. It is not intended to diagnose  infection nor to guide or monitor treatment. RESULT CALLED TO, READ BACK BY AND VERIFIED WITH: HOBBS A. AT 1051A ON [id number] BY THOMPSON S.          Radiology Studies: Dg Chest Port 1 View  Result Date: 12/02/2016 CLINICAL DATA:  Hypoxia.  Urinary bladder carcinoma EXAM: PORTABLE CHEST 1 VIEW COMPARISON:  08/29/2016 FINDINGS: There is pleural effusion with consolidation in the right base. A small left pleural effusion with left base atelectasis noted. Lungs elsewhere clear. There is stable cardiomegaly with pulmonary vascularity within normal limits. There is aortic atherosclerosis. No evident adenopathy. No appreciable bone lesions evident. IMPRESSION: Moderate pleural effusion on the right with minimal pleural effusion on the left. Airspace consolidation in the right base, probably at least in part due to compressive atelectasis but with questionable superimposed pneumonia. Slight atelectasis left base. Stable cardiomegaly. Aortic atherosclerosis. Electronically Signed   By: Lowella Grip III M.D.   On: 12/02/2016 09:27        Scheduled  Meds: . atenolol  50 mg Oral Daily  . Chlorhexidine Gluconate Cloth  6 each Topical Daily  . colchicine  0.6 mg Oral BID  . enoxaparin (LOVENOX) injection  40 mg Subcutaneous Q24H  . febuxostat  40 mg Oral Daily  . insulin aspart  0-5 Units Subcutaneous QHS  . insulin aspart  0-9 Units Subcutaneous TID WC  . ipratropium-albuterol  3 mL Nebulization TID  . loratadine  10 mg Oral Daily  . magnesium hydroxide  30 mL Oral BID  . mupirocin ointment  1 application Nasal BID  . pantoprazole  40 mg Oral Daily  . potassium chloride  40 mEq Oral BID  . pravastatin  40 mg Oral q1800   Continuous Infusions: . piperacillin-tazobactam (ZOSYN)  IV 3.375 g (12/03/16 0819)  . potassium chloride Stopped (12/03/16 0944)     LOS: 4 days    Time spent: 35 mins     Ronald Demario Arsenio Loader, MD [facility] Pager [phone]   If 7PM-7AM, please contact night-coverage [email or web address] Password [facility] 12/03/2016, 10:04 AM

## 2016-12-03 NOTE — Progress Notes (Signed)
3 Days Post-Op  Subjective: Patient looks better today. He states he feels better.  Objective: Vital signs in last 24 hours: Temp:  [98.2 F (36.8 C)-98.7 F (37.1 C)] 98.2 F (36.8 C) (06/22 0624) Pulse Rate:  [67-77] 67 (06/22 0624) Resp:  [16-18] 16 (06/22 0624) BP: (118-123)/(57-77) 118/64 (06/22 0624) SpO2:  [89 %-97 %] 95 % (06/22 0754) Weight:  [196 lb 13.9 oz (89.3 kg)] 196 lb 13.9 oz (89.3 kg) (06/22 0624) Last BM Date: 12/02/16  Intake/Output from previous day: 06/21 0701 - 06/22 0700 In: 510 [P.O.:360; IV Piggyback:150] Out: 3100 [Urine:3100] Intake/Output this shift: No intake/output data recorded.  General appearance: alert, cooperative and no distress GI: Soft, incisions healing well.  Lab Results:   Recent Labs  12/02/16 1629 12/03/16 0624  WBC 10.5 8.8  HGB 12.7* 12.6*  HCT 37.7* 37.1*  PLT 119* 121*   BMET  Recent Labs  12/02/16 1629 12/03/16 0624  NA 137 137  K 3.3* 2.9*  CL 99* 96*  CO2 28 32  GLUCOSE 109* 94  BUN 24* 27*  CREATININE 1.55* 1.65*  CALCIUM 8.2* 7.9*   PT/INR No results for input(s): LABPROT, INR in the last 72 hours.  Studies/Results: Dg Chest Port 1 View  Result Date: 12/02/2016 CLINICAL DATA:  Hypoxia.  Urinary bladder carcinoma EXAM: PORTABLE CHEST 1 VIEW COMPARISON:  08/29/2016 FINDINGS: There is pleural effusion with consolidation in the right base. A small left pleural effusion with left base atelectasis noted. Lungs elsewhere clear. There is stable cardiomegaly with pulmonary vascularity within normal limits. There is aortic atherosclerosis. No evident adenopathy. No appreciable bone lesions evident. IMPRESSION: Moderate pleural effusion on the right with minimal pleural effusion on the left. Airspace consolidation in the right base, probably at least in part due to compressive atelectasis but with questionable superimposed pneumonia. Slight atelectasis left base. Stable cardiomegaly. Aortic atherosclerosis.  Electronically Signed   By: Lowella Grip III M.D.   On: 12/02/2016 09:27    Anti-infectives: Anti-infectives    Start     Dose/Rate Route Frequency Ordered Stop   11/30/16 0100  piperacillin-tazobactam (ZOSYN) IVPB 3.375 g     3.375 g 12.5 mL/hr over 240 Minutes Intravenous Every 8 hours 11/29/16 2114     11/29/16 1745  piperacillin-tazobactam (ZOSYN) IVPB 3.375 g     3.375 g 100 mL/hr over 30 Minutes Intravenous  Once 11/29/16 1730 11/29/16 1848      Assessment/Plan: s/p Procedure(s): LAPAROSCOPIC CHOLECYSTECTOMY Impression: Overall, patient looks stronger than yesterday. Nothing more to add from the surgery standpoint. Does not need to be on oral antibiotics upon discharge.  LOS: 4 days    Aviva Signs 12/03/2016

## 2016-12-03 NOTE — Progress Notes (Addendum)
LCSW following for disposition:  New SNF  Bed offers have been given via message as niece Mickel Baas did not answer. Awaiting call back regarding choice.  Spoke with niece who is in agreement with [facility] and transfer over the weekend  Will update insurance and facility once choice determined. If patient discharged over weekend, please follow up and notify of [facility] as choice of bed.   Discussed with MD possible discharge on Sunday.  Will follow up with disposition, once call returned.  Lane Hacker, MSW Clinical Social Work: [employer] Coverage for :  [phone]

## 2016-12-03 NOTE — Care Management Important Message (Signed)
Important Message  Patient Details  Name: Ronald Russell MRN: [id number] Date of Birth: December 27, 1934   Medicare Important Message Given:  Yes    Sherald Barge, RN 12/03/2016, 1:28 PM

## 2016-12-04 ENCOUNTER — Inpatient Hospital Stay (HOSPITAL_COMMUNITY): Payer: PPO

## 2016-12-04 DIAGNOSIS — E876 Hypokalemia: Secondary | ICD-10-CM

## 2016-12-04 DIAGNOSIS — I10 Essential (primary) hypertension: Secondary | ICD-10-CM

## 2016-12-04 DIAGNOSIS — J90 Pleural effusion, not elsewhere classified: Secondary | ICD-10-CM

## 2016-12-04 DIAGNOSIS — K810 Acute cholecystitis: Secondary | ICD-10-CM

## 2016-12-04 LAB — COMPREHENSIVE METABOLIC PANEL
ALT: 27 U/L (ref 17–63)
AST: 24 U/L (ref 15–41)
Albumin: 2.6 g/dL — ABNORMAL LOW (ref 3.5–5.0)
Alkaline Phosphatase: 52 U/L (ref 38–126)
Anion gap: 9 (ref 5–15)
BUN: 24 mg/dL — ABNORMAL HIGH (ref 6–20)
CO2: 32 mmol/L (ref 22–32)
Calcium: 8 mg/dL — ABNORMAL LOW (ref 8.9–10.3)
Chloride: 96 mmol/L — ABNORMAL LOW (ref 101–111)
Creatinine, Ser: 1.53 mg/dL — ABNORMAL HIGH (ref 0.61–1.24)
GFR calc Af Amer: 48 mL/min — ABNORMAL LOW (ref >60)
GFR calc non Af Amer: 41 mL/min — ABNORMAL LOW (ref >60)
Glucose, Bld: 94 mg/dL (ref 65–99)
Potassium: 3 mmol/L — ABNORMAL LOW (ref 3.5–5.1)
Sodium: 137 mmol/L (ref 135–145)
Total Bilirubin: 0.6 mg/dL (ref 0.3–1.2)
Total Protein: 6 g/dL — ABNORMAL LOW (ref 6.5–8.1)

## 2016-12-04 LAB — CBC
HCT: 38.6 % — ABNORMAL LOW (ref 39.0–52.0)
Hemoglobin: 13 g/dL (ref 13.0–17.0)
MCH: 30.9 pg (ref 26.0–34.0)
MCHC: 33.7 g/dL (ref 30.0–36.0)
MCV: 91.7 fL (ref 78.0–100.0)
Platelets: 145 10*3/uL — ABNORMAL LOW (ref 150–400)
RBC: 4.21 MIL/uL — ABNORMAL LOW (ref 4.22–5.81)
RDW: 14.3 % (ref 11.5–15.5)
WBC: 7.2 10*3/uL (ref 4.0–10.5)

## 2016-12-04 LAB — GLUCOSE, CAPILLARY
Glucose-Capillary: 101 mg/dL — ABNORMAL HIGH (ref 65–99)
Glucose-Capillary: 91 mg/dL (ref 65–99)
Glucose-Capillary: 111 mg/dL — ABNORMAL HIGH (ref 65–99)
Glucose-Capillary: 101 mg/dL — ABNORMAL HIGH (ref 65–99)
Glucose-Capillary: 108 mg/dL — ABNORMAL HIGH (ref 65–99)

## 2016-12-04 MED ORDER — POTASSIUM CHLORIDE CRYS ER 20 MEQ PO TBCR
40.0000 meq | EXTENDED_RELEASE_TABLET | Freq: Once | ORAL | Status: AC
Start: 2016-12-04 — End: 2016-12-04
  Administered 2016-12-04: 40 meq via ORAL
  Filled 2016-12-04: qty 2

## 2016-12-04 MED ORDER — LORAZEPAM 0.5 MG PO TABS
0.5000 mg | ORAL_TABLET | Freq: Once | ORAL | Status: AC
  Administered 2016-12-04: 0.5 mg via ORAL
  Filled 2016-12-04: qty 1

## 2016-12-04 NOTE — Progress Notes (Signed)
Patient ID: Ronald Russell, male   DOB: 1934-10-25, 82 y.o.   MRN: [id number]  PROGRESS NOTE    Ronald Russell  FFM:[id number] DOB: 31-May-1935 DOA: 11/29/2016  PCP: Claretta Fraise, MD   Brief Narrative:  82 year old male with anxiety, hypertension who presented to ED with abdominal pain and was found to have acute cholecystitis secondary to cholelithiasic. Pt underwent laparoscopic cholecystectomy 6/1/92018.   Assessment & Plan:   Principal Problem:   Acute cholecystitis secondary to cholelithiasis - S/P lap chole 11/30/2016 - Pain adequately controlled at this time - Diet as tolerated - Surgery is following and we appreciate their input  - Continue zosyn, may d/c at the time of discharge   Active Problems:   Right sided pleural effusion - CXR this am with better aeration - Has gotten 3 doses of lasix 40 mg IV - Stable resp status     Hypokalemia - Due to lasix - Supplemented - Follow up BMP in am    Hypertension, essential - Continue atenolol    Dyslipidemia - Continue Pravachol    CKD stage 3 - Stable creatinine    DVT prophylaxis: Lovenox subQ Code Status: full code  Family Communication: no family at the bedside this am Disposition Plan: to SNF in am   Consultants:   Surgery   Procedures:   Lap chole 6/19  Antimicrobials:   Zosyn   Subjective: No overnight events.   Objective: Vitals:   12/04/16 1338 12/04/16 1500 12/04/16 1944 12/04/16 2029  BP:  110/68  123/61  Pulse:  71 67 80  Resp:  16 16 16   Temp:  98.6 F (37 C)  98.7 F (37.1 C)  TempSrc:  Oral  Oral  SpO2: 92% 94% 97% 93%  Weight:      Height:        Intake/Output Summary (Last 24 hours) at 12/04/16 2036 Last data filed at 12/04/16 1726  Gross per 24 hour  Intake             1110 ml  Output              400 ml  Net              710 ml   Filed Weights   11/29/16 1332 11/29/16 2019 12/03/16 0624  Weight: 92.5 kg (204 lb) 89.4 kg (197 lb 1.5 oz) 89.3 kg (196 lb 13.9 oz)      Examination:  General exam: Appears calm and comfortable  Respiratory system: Clear to auscultation. Respiratory effort normal. Cardiovascular system: S1 & S2 heard, RRR.  Gastrointestinal system: Abdomen is obese, (+) BS, non tender  Central nervous system: Alert and oriented. No focal neurological deficits. Extremities: Symmetric 5 x 5 power. Skin: No rashes, lesions or ulcers Psychiatry: Judgement and insight appear normal. Mood & affect appropriate.   Data Reviewed: I have personally reviewed following labs and imaging studies  CBC:  Recent Labs Lab 12/01/16 0547 12/02/16 0534 12/02/16 1629 12/03/16 0624 12/04/16 0617  WBC 12.8* 10.0 10.5 8.8 7.2  HGB 13.9 12.7* 12.7* 12.6* 13.0  HCT 41.4 37.6* 37.7* 37.1* 38.6*  MCV 92.6 92.2 90.8 90.9 91.7  PLT 102* 105* 119* 121* 570*   Basic Metabolic Panel:  Recent Labs Lab 12/02/16 0534 12/02/16 1629 12/03/16 0624 12/03/16 1237 12/04/16 0617  NA 137 137 137 138 137  K 3.5 3.3* 2.9* 3.2* 3.0*  CL 102 99* 96* 97* 96*  CO2 27 28 32 31 32  GLUCOSE 96 109* 94 115* 94  BUN 26* 24* 27* 26* 24*  CREATININE 1.43* 1.55* 1.65* 1.59* 1.53*  CALCIUM 7.8* 8.2* 7.9* 8.1* 8.0*  MG  --   --   --  2.2  --    GFR: Estimated Creatinine Clearance: 42.3 mL/min (A) (by C-G formula based on SCr of 1.53 mg/dL (H)). Liver Function Tests:  Recent Labs Lab 11/29/16 1508 12/01/16 0547 12/02/16 0534 12/03/16 0624 12/04/16 0617  AST 18 45* 33 23 24  ALT 17 41 34 29 27  ALKPHOS 47 46 39 46 52  BILITOT 1.3* 1.7* 1.3* 0.8 0.6  PROT 6.4* 5.5* 5.4* 5.9* 6.0*  ALBUMIN 3.8 2.8* 2.6* 2.7* 2.6*    Recent Labs Lab 11/29/16 1508  LIPASE 24   No results for input(s): AMMONIA in the last 168 hours. Coagulation Profile: No results for input(s): INR, PROTIME in the last 168 hours. Cardiac Enzymes: No results for input(s): CKTOTAL, CKMB, CKMBINDEX, TROPONINI in the last 168 hours. BNP (last 3 results) No results for input(s): PROBNP in  the last 8760 hours. HbA1C: No results for input(s): HGBA1C in the last 72 hours. CBG:  Recent Labs Lab 12/03/16 1624 12/04/16 0107 12/04/16 0732 12/04/16 1121 12/04/16 1633  GLUCAP 88 101* 91 111* 101*   Lipid Profile: No results for input(s): CHOL, HDL, LDLCALC, TRIG, CHOLHDL, LDLDIRECT in the last 72 hours. Thyroid Function Tests: No results for input(s): TSH, T4TOTAL, FREET4, T3FREE, THYROIDAB in the last 72 hours. Anemia Panel: No results for input(s): VITAMINB12, FOLATE, FERRITIN, TIBC, IRON, RETICCTPCT in the last 72 hours. Urine analysis:    Component Value Date/Time   COLORURINE AMBER (A) 12/25/2015 2022   APPEARANCEUR Clear 01/06/2016 1513   LABSPEC 1.035 (H) 12/25/2015 2022   PHURINE 5.5 12/25/2015 2022   GLUCOSEU Negative 01/06/2016 1513   HGBUR NEGATIVE 12/25/2015 2022   BILIRUBINUR Negative 01/06/2016 1513   KETONESUR NEGATIVE 12/25/2015 2022   PROTEINUR Trace 01/06/2016 1513   PROTEINUR NEGATIVE 12/25/2015 2022   UROBILINOGEN 0.2 08/31/2011 1403   NITRITE Negative 01/06/2016 1513   NITRITE NEGATIVE 12/25/2015 2022   LEUKOCYTESUR Negative 01/06/2016 1513   Sepsis Labs: @LABRCNTIP (procalcitonin:4,lacticidven:4)   ) Recent Results (from the past 240 hour(s))  Surgical PCR screen     Status: Abnormal   Collection Time: 11/30/16  8:28 AM  Result Value Ref Range Status   MRSA, PCR NEGATIVE NEGATIVE Final   Staphylococcus aureus POSITIVE (A) NEGATIVE Final    Comment:        The Xpert SA Assay (FDA approved for NASAL specimens in patients over 46 years of age), is one component of a comprehensive surveillance program.  Test performance has been validated by [facility] for patients greater than or equal to 80 year old. It is not intended to diagnose infection nor to guide or monitor treatment. RESULT CALLED TO, READ BACK BY AND VERIFIED WITH: HOBBS A. AT 1051A ON [id number] BY THOMPSON S.       Radiology Studies: Dg Chest Port 1 View  Result  Date: 12/04/2016 CLINICAL DATA:  Pleural effusion. EXAM: PORTABLE CHEST 1 VIEW COMPARISON:  Chest x-rays dated 12/02/2016, 11/29/2016 and 09/21/2016 and CT scan of the abdomen dated 11/29/2016 FINDINGS: The heart size and pulmonary vascularity are normal. The patient has a known moderate to large hiatal hernia. There is improved aeration at the lung bases with small residual pleural effusions/atelectasis, right more than left. IMPRESSION: Improved aeration at the lung bases. Slight residual effusions/ atelectasis at the bases.  Electronically Signed   By: Lorriane Shire M.D.   On: 12/04/2016 10:51   Dg Chest Port 1 View  Result Date: 12/02/2016 CLINICAL DATA:  Hypoxia.  Urinary bladder carcinoma EXAM: PORTABLE CHEST 1 VIEW COMPARISON:  08/29/2016 FINDINGS: There is pleural effusion with consolidation in the right base. A small left pleural effusion with left base atelectasis noted. Lungs elsewhere clear. There is stable cardiomegaly with pulmonary vascularity within normal limits. There is aortic atherosclerosis. No evident adenopathy. No appreciable bone lesions evident. IMPRESSION: Moderate pleural effusion on the right with minimal pleural effusion on the left. Airspace consolidation in the right base, probably at least in part due to compressive atelectasis but with questionable superimposed pneumonia. Slight atelectasis left base. Stable cardiomegaly. Aortic atherosclerosis. Electronically Signed   By: Lowella Grip III M.D.   On: 12/02/2016 09:27        Scheduled Meds: . atenolol  50 mg Oral Daily  . Chlorhexidine Gluconate Cloth  6 each Topical Daily  . colchicine  0.6 mg Oral BID  . enoxaparin (LOVENOX) injection  40 mg Subcutaneous Q24H  . febuxostat  40 mg Oral Daily  . insulin aspart  0-5 Units Subcutaneous QHS  . insulin aspart  0-9 Units Subcutaneous TID WC  . ipratropium-albuterol  3 mL Nebulization TID  . loratadine  10 mg Oral Daily  . magnesium hydroxide  30 mL Oral BID  .  mupirocin ointment  1 application Nasal BID  . pantoprazole  40 mg Oral Daily  . pravastatin  40 mg Oral q1800   Continuous Infusions: . piperacillin-tazobactam (ZOSYN)  IV 3.375 g (12/04/16 1726)     LOS: 5 days    Time spent: 25 minutes  Greater than 50% of the time spent on counseling and coordinating the care.   Leisa Lenz, MD [facility] Pager [phone]  If 7PM-7AM, please contact night-coverage [email or web address] Password [facility] 12/04/2016, 8:36 PM

## 2016-12-04 NOTE — Progress Notes (Signed)
4 Days Post-Op  Subjective: Patient still has some shortness of breath and overall deconditioning. Mild upset stomach noted. No throat pain.  Objective: Vital signs in last 24 hours: Temp:  [98.3 F (36.8 C)-98.7 F (37.1 C)] 98.3 F (36.8 C) (06/23 0537) Pulse Rate:  [69-75] 69 (06/23 0537) Resp:  [18] 18 (06/23 0537) BP: (103-121)/(56-65) 103/65 (06/23 0537) SpO2:  [93 %-95 %] 94 % (06/23 0537) Last BM Date: 12/03/16  Intake/Output from previous day: 06/22 0701 - 06/23 0700 In: 1310 [P.O.:960; IV Piggyback:350] Out: 2200 [Urine:2200] Intake/Output this shift: No intake/output data recorded.  General appearance: alert, cooperative and fatigued GI: Soft, incisions healing well.  Lab Results:   Recent Labs  12/03/16 0624 12/04/16 0617  WBC 8.8 7.2  HGB 12.6* 13.0  HCT 37.1* 38.6*  PLT 121* 145*   BMET  Recent Labs  12/03/16 1237 12/04/16 0617  NA 138 137  K 3.2* 3.0*  CL 97* 96*  CO2 31 32  GLUCOSE 115* 94  BUN 26* 24*  CREATININE 1.59* 1.53*  CALCIUM 8.1* 8.0*   PT/INR No results for input(s): LABPROT, INR in the last 72 hours.  Studies/Results: Dg Chest Port 1 View  Result Date: 12/02/2016 CLINICAL DATA:  Hypoxia.  Urinary bladder carcinoma EXAM: PORTABLE CHEST 1 VIEW COMPARISON:  08/29/2016 FINDINGS: There is pleural effusion with consolidation in the right base. A small left pleural effusion with left base atelectasis noted. Lungs elsewhere clear. There is stable cardiomegaly with pulmonary vascularity within normal limits. There is aortic atherosclerosis. No evident adenopathy. No appreciable bone lesions evident. IMPRESSION: Moderate pleural effusion on the right with minimal pleural effusion on the left. Airspace consolidation in the right base, probably at least in part due to compressive atelectasis but with questionable superimposed pneumonia. Slight atelectasis left base. Stable cardiomegaly. Aortic atherosclerosis. Electronically Signed   By:  Lowella Grip III M.D.   On: 12/02/2016 09:27    Anti-infectives: Anti-infectives    Start     Dose/Rate Route Frequency Ordered Stop   11/30/16 0100  piperacillin-tazobactam (ZOSYN) IVPB 3.375 g     3.375 g 12.5 mL/hr over 240 Minutes Intravenous Every 8 hours 11/29/16 2114     11/29/16 1745  piperacillin-tazobactam (ZOSYN) IVPB 3.375 g     3.375 g 100 mL/hr over 30 Minutes Intravenous  Once 11/29/16 1730 11/29/16 1848      Assessment/Plan: s/p Procedure(s): LAPAROSCOPIC CHOLECYSTECTOMY Impression: Patient still with hypokalemia and shortness of breath secondary to pleural effusion. From the surgery standpoint, he has healed well from his cholecystectomy. Okay for discharge to skilled nursing from my standpoint.  LOS: 5 days    Aviva Signs 12/04/2016

## 2016-12-05 LAB — CBC
HCT: 38.2 % — ABNORMAL LOW (ref 39.0–52.0)
Hemoglobin: 12.9 g/dL — ABNORMAL LOW (ref 13.0–17.0)
MCH: 30.7 pg (ref 26.0–34.0)
MCHC: 33.8 g/dL (ref 30.0–36.0)
MCV: 91 fL (ref 78.0–100.0)
Platelets: 156 10*3/uL (ref 150–400)
RBC: 4.2 MIL/uL — ABNORMAL LOW (ref 4.22–5.81)
RDW: 14.3 % (ref 11.5–15.5)
WBC: 6.3 10*3/uL (ref 4.0–10.5)

## 2016-12-05 LAB — BASIC METABOLIC PANEL
Anion gap: 11 (ref 5–15)
BUN: 18 mg/dL (ref 6–20)
CO2: 26 mmol/L (ref 22–32)
Calcium: 8.1 mg/dL — ABNORMAL LOW (ref 8.9–10.3)
Chloride: 101 mmol/L (ref 101–111)
Creatinine, Ser: 1.3 mg/dL — ABNORMAL HIGH (ref 0.61–1.24)
GFR calc Af Amer: 58 mL/min — ABNORMAL LOW (ref >60)
GFR calc non Af Amer: 50 mL/min — ABNORMAL LOW (ref >60)
Glucose, Bld: 115 mg/dL — ABNORMAL HIGH (ref 65–99)
Potassium: 2.9 mmol/L — ABNORMAL LOW (ref 3.5–5.1)
Sodium: 138 mmol/L (ref 135–145)

## 2016-12-05 LAB — GLUCOSE, CAPILLARY
Glucose-Capillary: 116 mg/dL — ABNORMAL HIGH (ref 65–99)
Glucose-Capillary: 118 mg/dL — ABNORMAL HIGH (ref 65–99)
Glucose-Capillary: 83 mg/dL (ref 65–99)
Glucose-Capillary: 88 mg/dL (ref 65–99)

## 2016-12-05 MED ORDER — LORAZEPAM 0.5 MG PO TABS: 0.5000 mg | ORAL_TABLET | Freq: Three times a day (TID) | ORAL | Status: DC | PRN

## 2016-12-05 MED ORDER — PROMETHAZINE HCL 25 MG/ML IJ SOLN: 12.5000 mg | Freq: Four times a day (QID) | INTRAMUSCULAR | Status: DC | PRN

## 2016-12-05 MED ORDER — POTASSIUM CHLORIDE CRYS ER 20 MEQ PO TBCR
40.0000 meq | EXTENDED_RELEASE_TABLET | Freq: Once | ORAL | Status: AC
  Administered 2016-12-05: 40 meq via ORAL
  Filled 2016-12-05: qty 2

## 2016-12-05 MED ORDER — WITCH HAZEL-GLYCERIN EX PADS: MEDICATED_PAD | CUTANEOUS | Status: DC | PRN

## 2016-12-05 MED ORDER — PROMETHAZINE HCL 25 MG/ML IJ SOLN: 25.0000 mg | Freq: Four times a day (QID) | INTRAMUSCULAR | Status: DC | PRN

## 2016-12-05 MED ORDER — PROMETHAZINE HCL 25 MG/ML IJ SOLN
12.5000 mg | Freq: Four times a day (QID) | INTRAMUSCULAR | Status: DC | PRN
  Administered 2016-12-05 – 2016-12-06 (×2): 12.5 mg via INTRAVENOUS
  Filled 2016-12-05 (×2): qty 1

## 2016-12-05 MED ORDER — PROMETHAZINE HCL 25 MG/ML IJ SOLN
25.0000 mg | Freq: Four times a day (QID) | INTRAMUSCULAR | Status: DC | PRN
  Administered 2016-12-05: 25 mg via INTRAVENOUS

## 2016-12-05 MED ORDER — PROMETHAZINE HCL 25 MG/ML IJ SOLN
12.5000 mg | Freq: Four times a day (QID) | INTRAMUSCULAR | Status: DC | PRN
  Filled 2016-12-05: qty 1

## 2016-12-05 NOTE — Progress Notes (Signed)
Pharmacy Antibiotic Note  Ronald Russell is a 82 y.o. male admitted on 11/29/2016 with Intra-abdominal Infection.  Pharmacy has been consulted for Zosyn dosing. Pt s/p lap cholecystecomy on 6/19. Antibiotics continued for now.  Plan: Continue Zosyn 3.375g IV q8h (4 hour infusion). Monitor labs, micro and vitals.  Deescalate tx as indicated  Height: 6' (182.9 cm) Weight: 196 lb 13.9 oz (89.3 kg) IBW/kg (Calculated) : 77.6  Temp (24hrs), Avg:98.6 F (37 C), Min:98.5 F (36.9 C), Max:98.7 F (37.1 C)   Recent Labs Lab 12/02/16 0534 12/02/16 1629 12/03/16 0624 12/03/16 1237 12/04/16 0617 12/05/16 0601  WBC 10.0 10.5 8.8  --  7.2 6.3  CREATININE 1.43* 1.55* 1.65* 1.59* 1.53* 1.30*    Estimated Creatinine Clearance: 49.7 mL/min (A) (by C-G formula based on SCr of 1.3 mg/dL (H)).    Allergies  Allergen Reactions  . Augmentin [Amoxicillin-Pot Clavulanate] Diarrhea  . Ciprofloxacin Nausea Only  . Codeine Other (See Comments)    Severe headache, nausea and vomiting  . Oxycodone Nausea And Vomiting  . Prednisone Other (See Comments)    hyperactivity  . Vicodin [Hydrocodone-Acetaminophen] Nausea And Vomiting    Antimicrobials this admission: Zosyn 6/18 >>   Dose adjustments this admission: n/a   Microbiology results: 6/19MRSA PCR: +  Thank you for allowing pharmacy to be a part of this patient's care.  Excell Seltzer, PharmD Clinical Pharmacist 12/05/2016 9:53 AM

## 2016-12-05 NOTE — Progress Notes (Signed)
5 Days Post-Op  Subjective: Patient moving his bowels. Denies any incisional pain.  Objective: Vital signs in last 24 hours: Temp:  [98.5 F (36.9 C)-98.7 F (37.1 C)] 98.5 F (36.9 C) (06/24 0500) Pulse Rate:  [67-80] 74 (06/24 0500) Resp:  [16] 16 (06/24 0500) BP: (110-123)/(47-68) 115/47 (06/24 0500) SpO2:  [92 %-97 %] 93 % (06/24 0500) Last BM Date: 12/04/16  Intake/Output from previous day: 06/23 0701 - 06/24 0700 In: 870 [P.O.:720; IV Piggyback:150] Out: 300 [Urine:300] Intake/Output this shift: No intake/output data recorded.  General appearance: alert, cooperative and no distress GI: Soft, incisions healing well.  Lab Results:   Recent Labs  12/04/16 0617 12/05/16 0601  WBC 7.2 6.3  HGB 13.0 12.9*  HCT 38.6* 38.2*  PLT 145* 156   BMET  Recent Labs  12/04/16 0617 12/05/16 0601  NA 137 138  K 3.0* 2.9*  CL 96* 101  CO2 32 26  GLUCOSE 94 115*  BUN 24* 18  CREATININE 1.53* 1.30*  CALCIUM 8.0* 8.1*   PT/INR No results for input(s): LABPROT, INR in the last 72 hours.  Studies/Results: Dg Chest Port 1 View  Result Date: 12/04/2016 CLINICAL DATA:  Pleural effusion. EXAM: PORTABLE CHEST 1 VIEW COMPARISON:  Chest x-rays dated 12/02/2016, 11/29/2016 and 09/21/2016 and CT scan of the abdomen dated 11/29/2016 FINDINGS: The heart size and pulmonary vascularity are normal. The patient has a known moderate to large hiatal hernia. There is improved aeration at the lung bases with small residual pleural effusions/atelectasis, right more than left. IMPRESSION: Improved aeration at the lung bases. Slight residual effusions/ atelectasis at the bases. Electronically Signed   By: Lorriane Shire M.D.   On: 12/04/2016 10:51    Anti-infectives: Anti-infectives    Start     Dose/Rate Route Frequency Ordered Stop   11/30/16 0100  piperacillin-tazobactam (ZOSYN) IVPB 3.375 g     3.375 g 12.5 mL/hr over 240 Minutes Intravenous Every 8 hours 11/29/16 2114     11/29/16 1745   piperacillin-tazobactam (ZOSYN) IVPB 3.375 g     3.375 g 100 mL/hr over 30 Minutes Intravenous  Once 11/29/16 1730 11/29/16 1848      Assessment/Plan: s/p Procedure(s): LAPAROSCOPIC CHOLECYSTECTOMY Impression: Postoperative day 5. Stable from surgical standpoint. Patient's shortness of breath seems to be resolving with diuretic therapy. Hypokalemia being addressed.  LOS: 6 days    Aviva Signs 12/05/2016

## 2016-12-05 NOTE — Progress Notes (Signed)
Patient ID: YURI FANA, male   DOB: [birth year], 82 y.o.   MRN: [id number]  PROGRESS NOTE    FARMER MCCAHILL  BUL:[id number] DOB: 03-27-1935 DOA: 11/29/2016  PCP: Claretta Fraise, MD   Brief Narrative:  82 year old male with anxiety, hypertension who presented to ED with abdominal pain and was found to have acute cholecystitis secondary to cholelithiasic. Pt underwent laparoscopic cholecystectomy 6/1/92018.   Assessment & Plan:   Principal Problem:   Acute cholecystitis secondary to cholelithiasis - S/P lap chole 11/30/2016 - Continue diet as tolerated - Surgery following - Continue zosyn  Active Problems:   Right sided pleural effusion - CXR on 6/23 showed improvement in lung aeration - SO far has gotten 3 doses of IV lasis - Stable respiratory status    Hypokalemia - Secondary to lasix  - Continue to supplement  - Check magnesium level    Hypertension, essential - Continue atenolol     Dyslipidemia - Continue pravachol    CKD stage 3 - Cr remains stable    DVT prophylaxis: stop lovenox sub Q, pt had bleeding hemorrhoid last night so will use SCD's Code Status: full code  Family Communication: family not at the bedside this am Disposition Plan: SNF in am   Consultants:   Surgery   Procedures:   Lap chole 6/19  Antimicrobials:   Zosyn   Subjective: Had an episode of bleeding hemorrhoid last night.  Objective: Vitals:   12/04/16 1500 12/04/16 1944 12/04/16 2029 12/05/16 0500  BP: 110/68  123/61 (!) 115/47  Pulse: 71 67 80 74  Resp: 16 16 16 16   Temp: 98.6 F (37 C)  98.7 F (37.1 C) 98.5 F (36.9 C)  TempSrc: Oral  Oral Oral  SpO2: 94% 97% 93% 93%  Weight:      Height:        Intake/Output Summary (Last 24 hours) at 12/05/16 0940 Last data filed at 12/05/16 0514  Gross per 24 hour  Intake              630 ml  Output              300 ml  Net              330 ml   Filed Weights   11/29/16 1332 11/29/16 2019 12/03/16 0624  Weight:  92.5 kg (204 lb) 89.4 kg (197 lb 1.5 oz) 89.3 kg (196 lb 13.9 oz)    Examination:  Physical Exam  Constitutional: Appears well-developed and well-nourished. No distress.  CVS: RRR, S1/S2 + Pulmonary: Effort and breath sounds normal, no stridor, rhonchi, wheezes, rales.  Abdominal: Soft. BS +,  no distension, tenderness, rebound or guarding.  Musculoskeletal: Normal range of motion. No edema and no tenderness.  Lymphadenopathy: No lymphadenopathy noted, cervical, inguinal. Neuro: Alert. Normal reflexes, muscle tone coordination. No cranial nerve deficit. Skin: Skin is warm and dry. No rash noted.   Psychiatric: Normal mood and affect. Behavior, judgment, thought content normal.     Data Reviewed: I have personally reviewed following labs and imaging studies  CBC:  Recent Labs Lab 12/02/16 0534 12/02/16 1629 12/03/16 0624 12/04/16 0617 12/05/16 0601  WBC 10.0 10.5 8.8 7.2 6.3  HGB 12.7* 12.7* 12.6* 13.0 12.9*  HCT 37.6* 37.7* 37.1* 38.6* 38.2*  MCV 92.2 90.8 90.9 91.7 91.0  PLT 105* 119* 121* 145* 321   Basic Metabolic Panel:  Recent Labs Lab 12/02/16 1629 12/03/16 2248 12/03/16 1237 12/04/16 0617 12/05/16 0601  NA 137 137 138 137 138  K 3.3* 2.9* 3.2* 3.0* 2.9*  CL 99* 96* 97* 96* 101  CO2 28 32 31 32 26  GLUCOSE 109* 94 115* 94 115*  BUN 24* 27* 26* 24* 18  CREATININE 1.55* 1.65* 1.59* 1.53* 1.30*  CALCIUM 8.2* 7.9* 8.1* 8.0* 8.1*  MG  --   --  2.2  --   --    GFR: Estimated Creatinine Clearance: 49.7 mL/min (A) (by C-G formula based on SCr of 1.3 mg/dL (H)). Liver Function Tests:  Recent Labs Lab 11/29/16 1508 12/01/16 0547 12/02/16 0534 12/03/16 0624 12/04/16 0617  AST 18 45* 33 23 24  ALT 17 41 34 29 27  ALKPHOS 47 46 39 46 52  BILITOT 1.3* 1.7* 1.3* 0.8 0.6  PROT 6.4* 5.5* 5.4* 5.9* 6.0*  ALBUMIN 3.8 2.8* 2.6* 2.7* 2.6*    Recent Labs Lab 11/29/16 1508  LIPASE 24   No results for input(s): AMMONIA in the last 168 hours. Coagulation  Profile: No results for input(s): INR, PROTIME in the last 168 hours. Cardiac Enzymes: No results for input(s): CKTOTAL, CKMB, CKMBINDEX, TROPONINI in the last 168 hours. BNP (last 3 results) No results for input(s): PROBNP in the last 8760 hours. HbA1C: No results for input(s): HGBA1C in the last 72 hours. CBG:  Recent Labs Lab 12/04/16 0732 12/04/16 1121 12/04/16 1633 12/04/16 2217 12/05/16 0727  GLUCAP 91 111* 101* 108* 116*   Lipid Profile: No results for input(s): CHOL, HDL, LDLCALC, TRIG, CHOLHDL, LDLDIRECT in the last 72 hours. Thyroid Function Tests: No results for input(s): TSH, T4TOTAL, FREET4, T3FREE, THYROIDAB in the last 72 hours. Anemia Panel: No results for input(s): VITAMINB12, FOLATE, FERRITIN, TIBC, IRON, RETICCTPCT in the last 72 hours. Urine analysis:    Component Value Date/Time   COLORURINE AMBER (A) 12/25/2015 2022   APPEARANCEUR Clear 01/06/2016 1513   LABSPEC 1.035 (H) 12/25/2015 2022   PHURINE 5.5 12/25/2015 2022   GLUCOSEU Negative 01/06/2016 1513   HGBUR NEGATIVE 12/25/2015 2022   BILIRUBINUR Negative 01/06/2016 1513   KETONESUR NEGATIVE 12/25/2015 2022   PROTEINUR Trace 01/06/2016 1513   PROTEINUR NEGATIVE 12/25/2015 2022   UROBILINOGEN 0.2 08/31/2011 1403   NITRITE Negative 01/06/2016 1513   NITRITE NEGATIVE 12/25/2015 2022   LEUKOCYTESUR Negative 01/06/2016 1513   Sepsis Labs: @LABRCNTIP (procalcitonin:4,lacticidven:4)   ) Recent Results (from the past 240 hour(s))  Surgical PCR screen     Status: Abnormal   Collection Time: 11/30/16  8:28 AM  Result Value Ref Range Status   MRSA, PCR NEGATIVE NEGATIVE Final   Staphylococcus aureus POSITIVE (A) NEGATIVE Final    Comment:        The Xpert SA Assay (FDA approved for NASAL specimens in patients over 33 years of age), is one component of a comprehensive surveillance program.  Test performance has been validated by [facility] for patients greater than or equal to 31 year old. It  is not intended to diagnose infection nor to guide or monitor treatment. RESULT CALLED TO, READ BACK BY AND VERIFIED WITH: HOBBS A. AT 1051A ON [id number] BY THOMPSON S.       Radiology Studies: Dg Chest Port 1 View  Result Date: 12/04/2016 CLINICAL DATA:  Pleural effusion. EXAM: PORTABLE CHEST 1 VIEW COMPARISON:  Chest x-rays dated 12/02/2016, 11/29/2016 and 09/21/2016 and CT scan of the abdomen dated 11/29/2016 FINDINGS: The heart size and pulmonary vascularity are normal. The patient has a known moderate to large hiatal hernia. There is improved  aeration at the lung bases with small residual pleural effusions/atelectasis, right more than left. IMPRESSION: Improved aeration at the lung bases. Slight residual effusions/ atelectasis at the bases. Electronically Signed   By: Lorriane Shire M.D.   On: 12/04/2016 10:51   Dg Chest Port 1 View  Result Date: 12/02/2016 CLINICAL DATA:  Hypoxia.  Urinary bladder carcinoma EXAM: PORTABLE CHEST 1 VIEW COMPARISON:  08/29/2016 FINDINGS: There is pleural effusion with consolidation in the right base. A small left pleural effusion with left base atelectasis noted. Lungs elsewhere clear. There is stable cardiomegaly with pulmonary vascularity within normal limits. There is aortic atherosclerosis. No evident adenopathy. No appreciable bone lesions evident. IMPRESSION: Moderate pleural effusion on the right with minimal pleural effusion on the left. Airspace consolidation in the right base, probably at least in part due to compressive atelectasis but with questionable superimposed pneumonia. Slight atelectasis left base. Stable cardiomegaly. Aortic atherosclerosis. Electronically Signed   By: Lowella Grip III M.D.   On: 12/02/2016 09:27        Scheduled Meds: . atenolol  50 mg Oral Daily  . Chlorhexidine Gluconate Cloth  6 each Topical Daily  . colchicine  0.6 mg Oral BID  . enoxaparin (LOVENOX) injection  40 mg Subcutaneous Q24H  . febuxostat  40 mg  Oral Daily  . insulin aspart  0-5 Units Subcutaneous QHS  . insulin aspart  0-9 Units Subcutaneous TID WC  . ipratropium-albuterol  3 mL Nebulization TID  . loratadine  10 mg Oral Daily  . magnesium hydroxide  30 mL Oral BID  . mupirocin ointment  1 application Nasal BID  . pantoprazole  40 mg Oral Daily  . potassium chloride  40 mEq Oral Once  . pravastatin  40 mg Oral q1800   Continuous Infusions: . piperacillin-tazobactam (ZOSYN)  IV 3.375 g (12/05/16 0758)     LOS: 6 days    Time spent: 25 minutes  Greater than 50% of the time spent on counseling and coordinating the care.   Leisa Lenz, MD [facility] Pager [phone]  If 7PM-7AM, please contact night-coverage [email or web address] Password [facility] 12/05/2016, 9:40 AM

## 2016-12-06 ENCOUNTER — Inpatient Hospital Stay
Admission: RE | Admit: 2016-12-06 | Discharge: 2016-12-09 | Disposition: A | Discharge status: Other Acute Inpatient Hospital | Payer: PPO | Source: Ambulatory Visit | Attending: Internal Medicine | Admitting: Internal Medicine

## 2016-12-06 DIAGNOSIS — R739 Hyperglycemia, unspecified: Secondary | ICD-10-CM | POA: Diagnosis not present

## 2016-12-06 DIAGNOSIS — E559 Vitamin D deficiency, unspecified: Secondary | ICD-10-CM | POA: Diagnosis not present

## 2016-12-06 DIAGNOSIS — E785 Hyperlipidemia, unspecified: Secondary | ICD-10-CM | POA: Diagnosis not present

## 2016-12-06 DIAGNOSIS — B0229 Other postherpetic nervous system involvement: Secondary | ICD-10-CM | POA: Diagnosis not present

## 2016-12-06 DIAGNOSIS — N289 Disorder of kidney and ureter, unspecified: Secondary | ICD-10-CM | POA: Diagnosis not present

## 2016-12-06 DIAGNOSIS — J90 Pleural effusion, not elsewhere classified: Secondary | ICD-10-CM | POA: Diagnosis not present

## 2016-12-06 DIAGNOSIS — E876 Hypokalemia: Secondary | ICD-10-CM | POA: Diagnosis not present

## 2016-12-06 DIAGNOSIS — H9190 Unspecified hearing loss, unspecified ear: Secondary | ICD-10-CM | POA: Diagnosis not present

## 2016-12-06 DIAGNOSIS — M6281 Muscle weakness (generalized): Secondary | ICD-10-CM | POA: Diagnosis not present

## 2016-12-06 DIAGNOSIS — R262 Difficulty in walking, not elsewhere classified: Secondary | ICD-10-CM | POA: Diagnosis not present

## 2016-12-06 DIAGNOSIS — H8110 Benign paroxysmal vertigo, unspecified ear: Secondary | ICD-10-CM | POA: Diagnosis not present

## 2016-12-06 DIAGNOSIS — R059 Cough, unspecified: Principal | ICD-10-CM

## 2016-12-06 DIAGNOSIS — K219 Gastro-esophageal reflux disease without esophagitis: Secondary | ICD-10-CM | POA: Diagnosis not present

## 2016-12-06 DIAGNOSIS — D696 Thrombocytopenia, unspecified: Secondary | ICD-10-CM

## 2016-12-06 DIAGNOSIS — K810 Acute cholecystitis: Secondary | ICD-10-CM | POA: Diagnosis not present

## 2016-12-06 DIAGNOSIS — D849 Immunodeficiency, unspecified: Secondary | ICD-10-CM | POA: Diagnosis not present

## 2016-12-06 DIAGNOSIS — R002 Palpitations: Secondary | ICD-10-CM | POA: Diagnosis not present

## 2016-12-06 DIAGNOSIS — R279 Unspecified lack of coordination: Secondary | ICD-10-CM | POA: Diagnosis not present

## 2016-12-06 DIAGNOSIS — I10 Essential (primary) hypertension: Secondary | ICD-10-CM | POA: Diagnosis not present

## 2016-12-06 DIAGNOSIS — M1000 Idiopathic gout, unspecified site: Secondary | ICD-10-CM | POA: Diagnosis not present

## 2016-12-06 DIAGNOSIS — R05 Cough: Principal | ICD-10-CM

## 2016-12-06 DIAGNOSIS — F411 Generalized anxiety disorder: Secondary | ICD-10-CM | POA: Diagnosis not present

## 2016-12-06 DIAGNOSIS — Z48815 Encounter for surgical aftercare following surgery on the digestive system: Secondary | ICD-10-CM | POA: Diagnosis not present

## 2016-12-06 LAB — BASIC METABOLIC PANEL
Anion gap: 8 (ref 5–15)
BUN: 16 mg/dL (ref 6–20)
CO2: 27 mmol/L (ref 22–32)
Calcium: 8.1 mg/dL — ABNORMAL LOW (ref 8.9–10.3)
Chloride: 104 mmol/L (ref 101–111)
Creatinine, Ser: 1.34 mg/dL — ABNORMAL HIGH (ref 0.61–1.24)
GFR calc Af Amer: 56 mL/min — ABNORMAL LOW (ref >60)
GFR calc non Af Amer: 48 mL/min — ABNORMAL LOW (ref >60)
Glucose, Bld: 98 mg/dL (ref 65–99)
Potassium: 3.2 mmol/L — ABNORMAL LOW (ref 3.5–5.1)
Sodium: 139 mmol/L (ref 135–145)

## 2016-12-06 LAB — CBC
HCT: 38.3 % — ABNORMAL LOW (ref 39.0–52.0)
Hemoglobin: 12.9 g/dL — ABNORMAL LOW (ref 13.0–17.0)
MCH: 30.4 pg (ref 26.0–34.0)
MCHC: 33.7 g/dL (ref 30.0–36.0)
MCV: 90.3 fL (ref 78.0–100.0)
Platelets: 172 10*3/uL (ref 150–400)
RBC: 4.24 MIL/uL (ref 4.22–5.81)
RDW: 14.3 % (ref 11.5–15.5)
WBC: 6.8 10*3/uL (ref 4.0–10.5)

## 2016-12-06 LAB — GLUCOSE, CAPILLARY
Glucose-Capillary: 107 mg/dL — ABNORMAL HIGH (ref 65–99)
Glucose-Capillary: 106 mg/dL — ABNORMAL HIGH (ref 65–99)

## 2016-12-06 LAB — MAGNESIUM: Magnesium: 2.2 mg/dL (ref 1.7–2.4)

## 2016-12-06 MED ORDER — HYDROCORTISONE 1 % EX CREA
TOPICAL_CREAM | Freq: Three times a day (TID) | CUTANEOUS | Status: DC | PRN
  Administered 2016-12-06: 13:00:00 via TOPICAL
  Filled 2016-12-06: qty 1.5

## 2016-12-06 MED ORDER — POTASSIUM CHLORIDE CRYS ER 20 MEQ PO TBCR
40.0000 meq | EXTENDED_RELEASE_TABLET | Freq: Once | ORAL | Status: AC
  Administered 2016-12-06: 40 meq via ORAL
  Filled 2016-12-06: qty 2

## 2016-12-06 MED ORDER — HYDROCORTISONE 1 % EX CREA: TOPICAL_CREAM | Freq: Three times a day (TID) | CUTANEOUS | 0 refills | Status: DC | PRN

## 2016-12-06 MED ORDER — TRAMADOL HCL 50 MG PO TABS: 50.0000 mg | ORAL_TABLET | Freq: Four times a day (QID) | ORAL | 0 refills | Status: DC | PRN

## 2016-12-06 MED ORDER — LORAZEPAM 0.5 MG PO TABS: 0.5000 mg | ORAL_TABLET | Freq: Three times a day (TID) | ORAL | 0 refills | Status: DC | PRN

## 2016-12-06 MED ORDER — WITCH HAZEL-GLYCERIN EX PADS
MEDICATED_PAD | CUTANEOUS | Status: DC | PRN
Start: 2016-12-06 — End: 2016-12-06

## 2016-12-06 MED ORDER — PROMETHAZINE HCL 25 MG PO TABS: 25.0000 mg | ORAL_TABLET | Freq: Four times a day (QID) | ORAL | 0 refills | Status: DC | PRN

## 2016-12-06 MED ORDER — PROMETHAZINE HCL 12.5 MG PO TABS
25.0000 mg | ORAL_TABLET | Freq: Four times a day (QID) | ORAL | Status: DC | PRN
  Administered 2016-12-06 (×2): 25 mg via ORAL
  Filled 2016-12-06 (×2): qty 2

## 2016-12-06 MED ORDER — SIMETHICONE 80 MG PO CHEW: 40.0000 mg | CHEWABLE_TABLET | Freq: Four times a day (QID) | ORAL | 0 refills | Status: DC | PRN

## 2016-12-06 MED ORDER — WITCH HAZEL-GLYCERIN EX PADS: MEDICATED_PAD | CUTANEOUS | 12 refills | Status: DC | PRN

## 2016-12-06 MED ORDER — MAGIC MOUTHWASH
10.0000 mL | Freq: Four times a day (QID) | ORAL | Status: DC
  Administered 2016-12-06: 10 mL via ORAL
  Filled 2016-12-06: qty 10

## 2016-12-06 MED ORDER — MAGNESIUM HYDROXIDE 400 MG/5ML PO SUSP: 30.0000 mL | Freq: Two times a day (BID) | ORAL | 0 refills | Status: DC | PRN

## 2016-12-06 MED ORDER — IPRATROPIUM-ALBUTEROL 0.5-2.5 (3) MG/3ML IN SOLN: 3.0000 mL | RESPIRATORY_TRACT | 0 refills | Status: DC | PRN

## 2016-12-06 MED ORDER — ACETAMINOPHEN 325 MG PO TABS: 650.0000 mg | ORAL_TABLET | Freq: Four times a day (QID) | ORAL | 0 refills | Status: DC | PRN

## 2016-12-06 MED ORDER — MAGNESIUM HYDROXIDE 400 MG/5ML PO SUSP: 30.0000 mL | Freq: Two times a day (BID) | ORAL | Status: DC | PRN

## 2016-12-06 NOTE — Discharge Instructions (Signed)
Cholecystitis Cholecystitis is swelling and irritation (inflammation) of the gallbladder. The gallbladder is an organ that is shaped like a pear. It is under the liver on the right side of the body. This condition is often caused by gallstones. You doctor may do tests to see how your gallbladder works. These tests may include:  Imaging tests, such as: ? An ultrasound. ? MRI.  Tests that check how your liver works.  This condition needs treatment. Follow these instructions at home: Home care will depend on your treatment. In general:  Take over-the-counter and prescription medicines only as told by your doctor.  If you were prescribed an antibiotic medicine, take it as told by your doctor. Do not stop taking the antibiotic even if you start to feel better.  Follow instructions from your doctor about what to eat or drink. When you are allowed to eat, avoid eating or drinking anything that causes your symptoms to start.  Keep all follow-up visits as told by your doctor. This is important.  Contact a doctor if:  You have pain and your medicine does not help.  You have a fever. Get help right away if:  Your pain moves to: ? Another part of your belly (abdomen). ? Your back.  Your symptoms do not go away.  You have new symptoms. This information is not intended to replace advice given to you by your health care provider. Make sure you discuss any questions you have with your health care provider. Document Released: 05/20/2011 Document Revised: 11/06/2015 Document Reviewed: 09/11/2014 Elsevier Interactive Patient Education  2018 [employer].

## 2016-12-06 NOTE — ACP (Advance Care Planning) (Signed)
Assisted Mr Geter with information on his Advance Directives. He completed the HCPOA and Living Will and we were unable to complete by his signature before his discharge to [facility]. We made plans to complete this tomorrow, June 26.

## 2016-12-06 NOTE — Care Management Note (Signed)
Case Management Note  Patient Details  Name: Ronald Russell MRN: [id number] Date of Birth: [birth year]  Expected Discharge Date:  12/06/16               Expected Discharge Plan:  [place]  In-House Referral:  NA, Clinical Social Work  Discharge planning Services  CM Consult  Post Acute Care Choice:  NA Choice offered to:  NA  Status of Service:  Completed, signed off  Additional Comments: Pt discharging to SNF today, CSW has made arrangements.  Sherald Barge, RN 12/06/2016, 10:48 AM

## 2016-12-06 NOTE — Progress Notes (Signed)
6 Days Post-Op  Subjective: Patient states he feels much better. His shortness of breath has significantly improved.  Objective: Vital signs in last 24 hours: Temp:  [98 F (36.7 C)-99.4 F (37.4 C)] 98.3 F (36.8 C) (06/25 0540) Pulse Rate:  [68-73] 70 (06/25 0540) Resp:  [16] 16 (06/25 0540) BP: (88-112)/(50-68) 103/54 (06/25 0540) SpO2:  [92 %-95 %] 94 % (06/25 0719) Last BM Date: 12/04/16  Intake/Output from previous day: 06/24 0701 - 06/25 0700 In: 360 [P.O.:360] Out: 400 [Urine:400] Intake/Output this shift: No intake/output data recorded.  General appearance: alert, cooperative and no distress GI: Soft, incisions healing well. Staples removed, Steri-Strips applied.  Lab Results:   Recent Labs  12/04/16 0617 12/05/16 0601  WBC 7.2 6.3  HGB 13.0 12.9*  HCT 38.6* 38.2*  PLT 145* 156   BMET  Recent Labs  12/04/16 0617 12/05/16 0601  NA 137 138  K 3.0* 2.9*  CL 96* 101  CO2 32 26  GLUCOSE 94 115*  BUN 24* 18  CREATININE 1.53* 1.30*  CALCIUM 8.0* 8.1*   PT/INR No results for input(s): LABPROT, INR in the last 72 hours.  Studies/Results: Dg Chest Port 1 View  Result Date: 12/04/2016 CLINICAL DATA:  Pleural effusion. EXAM: PORTABLE CHEST 1 VIEW COMPARISON:  Chest x-rays dated 12/02/2016, 11/29/2016 and 09/21/2016 and CT scan of the abdomen dated 11/29/2016 FINDINGS: The heart size and pulmonary vascularity are normal. The patient has a known moderate to large hiatal hernia. There is improved aeration at the lung bases with small residual pleural effusions/atelectasis, right more than left. IMPRESSION: Improved aeration at the lung bases. Slight residual effusions/ atelectasis at the bases. Electronically Signed   By: Lorriane Shire M.D.   On: 12/04/2016 10:51    Anti-infectives: Anti-infectives    Start     Dose/Rate Route Frequency Ordered Stop   11/30/16 0100  piperacillin-tazobactam (ZOSYN) IVPB 3.375 g     3.375 g 12.5 mL/hr over 240 Minutes  Intravenous Every 8 hours 11/29/16 2114     11/29/16 1745  piperacillin-tazobactam (ZOSYN) IVPB 3.375 g     3.375 g 100 mL/hr over 30 Minutes Intravenous  Once 11/29/16 1730 11/29/16 1848      Assessment/Plan: s/p Procedure(s): LAPAROSCOPIC CHOLECYSTECTOMY Impression: Okay for discharge from surgery standpoint. Does not need to be discharged on antibiotics. No need for surgical follow-up at this time.  LOS: 7 days    Aviva Signs 12/06/2016

## 2016-12-06 NOTE — Progress Notes (Signed)
-  LCSW following for disposition:  Discharge today to [place] for SNF has been approved for 7 days per Santiago Glad :  [address])  Family has been notified and coming to hospital to assist with transition after lunch. Patient will transfer by hospital staff.  No other needs at this time.    Lane Hacker, MSW Clinical Social Work: [employer] Coverage for :  [phone]

## 2016-12-06 NOTE — Discharge Summary (Signed)
Physician Discharge Summary  Ronald Russell VFI:[id number] DOB: Jan 21, 1935 DOA: 11/29/2016  PCP: Claretta Fraise, MD  Admit date: 11/29/2016 Discharge date: 12/06/2016  Recommendations for Outpatient Follow-up:  1. Follow-up with primary care physician per scheduled appointment.  Discharge Diagnoses:  Principal Problem:   Acute cholecystitis Active Problems:   Hypertension   GAD (generalized anxiety disorder)   Renal insufficiency   Hyperglycemia   Thrombocytopenia (HCC)    Discharge Condition: stable   Diet recommendation: as tolerated   History of present illness:  82 year old male with anxiety, hypertension who presented to ED with abdominal pain and was found to have acute cholecystitis secondary to cholelithiasic. Pt underwent laparoscopic cholecystectomy 6/1/92018.  Hospital Course:  Principal Problem:   Acute cholecystitis secondary to cholelithiasis - S/P lap chole 11/30/2016 - Okay to stop antibiotics prior to discharge per surgery  Active Problems:   Right sided pleural effusion - CXR on 6/23 showed improvement in lung aeration - Stable respiratory status - During this hospital stay patient has had 3 doses of IV Lasix    Hypokalemia - Secondary to lasix  - Continue to supplement as per prior home regimen    Hypertension, essential - Continue atenolol    Dyslipidemia - Continue Pravachol    CKD stage 3 - Creatinine stable, 1.3 - 1.5   DVT prophylaxis:  SCDs Code Status: full code  Family Communication:  no family at the bedside this morning    Consultants:   Surgery   Procedures:   Lap chole 6/19  Antimicrobials:   Zosyn    Signed:  Leisa Lenz, MD  [facility] 12/06/2016, 10:08 AM  Pager #: [phone]  Time spent in minutes: more than 30 minutes    Discharge Exam: Vitals:   12/05/16 2209 12/06/16 0540  BP: (!) 88/68 (!) 103/54  Pulse: 73 70  Resp: 16 16  Temp: 99.4 F (37.4 C) 98.3 F (36.8 C)    Vitals:   12/05/16 2005 12/05/16 2209 12/06/16 0540 12/06/16 0719  BP:  (!) 88/68 (!) 103/54   Pulse: 70 73 70   Resp: 16 16 16    Temp:  99.4 F (37.4 C) 98.3 F (36.8 C)   TempSrc:  Oral Oral   SpO2: 95% 92% 94% 94%  Weight:      Height:        General: Pt is alert, follows commands appropriately, not in acute distress Cardiovascular: Regular rate and rhythm, S1/S2 + Respiratory: Clear to auscultation bilaterally, no wheezing, no crackles, no rhonchi Abdominal: Soft, non tender, non distended, bowel sounds +, no guarding Extremities: no edema, no cyanosis, pulses palpable bilaterally DP and PT Neuro: Grossly nonfocal  Discharge Instructions  Discharge Instructions    Call MD for:  persistant nausea and vomiting    Complete by:  As directed    Call MD for:  redness, tenderness, or signs of infection (pain, swelling, redness, odor or green/yellow discharge around incision site)    Complete by:  As directed    Call MD for:  severe uncontrolled pain    Complete by:  As directed    Diet - low sodium heart healthy    Complete by:  As directed    Increase activity slowly    Complete by:  As directed      Allergies as of 12/06/2016      Reactions   Augmentin [amoxicillin-pot Clavulanate] Diarrhea   Ciprofloxacin Nausea Only   Codeine Other (See Comments)   Severe headache, nausea  and vomiting   Oxycodone Nausea And Vomiting   Prednisone Other (See Comments)   hyperactivity   Vicodin [hydrocodone-acetaminophen] Nausea And Vomiting      Medication List    TAKE these medications   acetaminophen 325 MG tablet Commonly known as:  TYLENOL Take 2 tablets (650 mg total) by mouth every 6 (six) hours as needed for mild pain (or Fever >/= 101).   atenolol 50 MG tablet Commonly known as:  TENORMIN Take 1 tablet (50 mg total) by mouth daily.   colchicine 0.6 MG tablet Take 1 tablet (0.6 mg total) by mouth 2 (two) times daily.   febuxostat 40 MG tablet Commonly known as:   ULORIC Take 1 tablet (40 mg total) by mouth daily. To prevent gout   fexofenadine 180 MG tablet Commonly known as:  ALLEGRA Take 180 mg by mouth every morning.   ipratropium-albuterol 0.5-2.5 (3) MG/3ML Soln Commonly known as:  DUONEB Take 3 mLs by nebulization every 4 (four) hours as needed.   LORazepam 0.5 MG tablet Commonly known as:  ATIVAN Take 1 tablet (0.5 mg total) by mouth every 8 (eight) hours as needed for anxiety or sleep. What changed:  how much to take  how to take this  when to take this  reasons to take this  additional instructions   omeprazole 40 MG capsule Commonly known as:  PRILOSEC TAKE 1 CAPSULE (40 MG TOTAL) BY MOUTH 2 (TWO) TIMES DAILY.   Potassium Aminobenzoate 500 MG Caps Take 1 capsule by mouth every other day.   pravastatin 40 MG tablet Commonly known as:  PRAVACHOL Take 40 mg by mouth daily.   promethazine 25 MG tablet Commonly known as:  PHENERGAN Take 1 tablet (25 mg total) by mouth every 6 (six) hours as needed for nausea or vomiting.   simethicone 80 MG chewable tablet Commonly known as:  MYLICON Chew 0.5 tablets (40 mg total) by mouth every 6 (six) hours as needed for flatulence (bloating).   traMADol 50 MG tablet Commonly known as:  ULTRAM Take 1 tablet (50 mg total) by mouth every 6 (six) hours as needed for moderate pain.   Vitamin D3 5000 units Caps Take 1 capsule (5,000 Units total) by mouth daily.       Contact information for follow-up providers    Claretta Fraise, MD. Schedule an appointment as soon as possible for a visit in 1 week(s).   Specialty:  Family Medicine Contact information: [place] [address] [phone]            Contact information for after-discharge care    [facility] Follow up.   Specialty:  Skilled Nursing Facility Contact information: [address] [place] [phone]                   The  results of significant diagnostics from this hospitalization (including imaging, microbiology, ancillary and laboratory) are listed below for reference.    Significant Diagnostic Studies: Ct Abdomen Pelvis W Contrast  Result Date: 11/29/2016 CLINICAL DATA:  LEFT upper quadrant pain after eating tenderloin, biscuits and gravy yesterday morning. Nausea and vomiting beginning this morning. History of hypertension, bladder cancer, clotting disorder, immune deficiency. EXAM: CT ABDOMEN AND PELVIS WITH CONTRAST TECHNIQUE: Multidetector CT imaging of the abdomen and pelvis was performed using the standard protocol following bolus administration of intravenous contrast. CONTRAST:  100 cc Isovue-300 COMPARISON:  CT abdomen and pelvis December 24, 2016 FINDINGS: LOWER CHEST: Dependent atelectasis. Heart size is normal. No pericardial effusion. HEPATOBILIARY: Subcentimeter gallstones at the neck. Mild gallbladder distension pericholecystic fat stranding. 4 mm gallstone within the proximal cystic duct. Subcentimeter probable cyst LEFT lobe of the liver. Trace intrahepatic biliary dilatation. PANCREAS: Normal. SPLEEN: Normal. ADRENALS/URINARY TRACT: Kidneys are orthotopic, demonstrating symmetric enhancement. No nephrolithiasis, hydronephrosis or solid renal masses. Too small to characterize hypodensities in the kidneys bilaterally. 2.5 cm homogeneously hypodense LEFT lower pole renal cyst. Punctate LEFT lower pole nephrolithiasis. The unopacified ureters are normal in course and caliber. Delayed imaging through the kidneys demonstrates symmetric prompt contrast excretion within the proximal urinary collecting system. Urinary bladder is partially distended and unremarkable. 19 mm LEFT adrenal mass, previously characterized as benign adenoma by noncontrast CT. STOMACH/BOWEL: Small hiatal hernia. The stomach, small and large bowel are normal in course and caliber without inflammatory changes. Mild LEFT colon diverticulosis.  Normal appendix. VASCULAR/LYMPHATIC: Ectatic infrarenal aorta without aneurysm. Moderate to severe calcific atherosclerosis No lymphadenopathy by CT size criteria. REPRODUCTIVE: Prostatic calcifications without prostatomegaly. OTHER: No intraperitoneal free fluid or free air. MUSCULOSKELETAL: Nonacute. Mild degenerative change of the hips. Moderate L4-5 and moderate to severe L5-S1 degenerative discs. IMPRESSION: Cholelithiasis and suspected acute cholecystitis. 4 mm stone within the proximal cystic duct. Trace intrahepatic biliary dilatation. Punctate nonobstructing LEFT nephrolithiasis. Electronically Signed   By: Elon Alas M.D.   On: 11/29/2016 17:08   Dg Chest Port 1 View  Result Date: 12/04/2016 CLINICAL DATA:  Pleural effusion. EXAM: PORTABLE CHEST 1 VIEW COMPARISON:  Chest x-rays dated 12/02/2016, 11/29/2016 and 09/21/2016 and CT scan of the abdomen dated 11/29/2016 FINDINGS: The heart size and pulmonary vascularity are normal. The patient has a known moderate to large hiatal hernia. There is improved aeration at the lung bases with small residual pleural effusions/atelectasis, right more than left. IMPRESSION: Improved aeration at the lung bases. Slight residual effusions/ atelectasis at the bases. Electronically Signed   By: Lorriane Shire M.D.   On: 12/04/2016 10:51   Dg Chest Port 1 View  Result Date: 12/02/2016 CLINICAL DATA:  Hypoxia.  Urinary bladder carcinoma EXAM: PORTABLE CHEST 1 VIEW COMPARISON:  08/29/2016 FINDINGS: There is pleural effusion with consolidation in the right base. A small left pleural effusion with left base atelectasis noted. Lungs elsewhere clear. There is stable cardiomegaly with pulmonary vascularity within normal limits. There is aortic atherosclerosis. No evident adenopathy. No appreciable bone lesions evident. IMPRESSION: Moderate pleural effusion on the right with minimal pleural effusion on the left. Airspace consolidation in the right base, probably at  least in part due to compressive atelectasis but with questionable superimposed pneumonia. Slight atelectasis left base. Stable cardiomegaly. Aortic atherosclerosis. Electronically Signed   By: Lowella Grip III M.D.   On: 12/02/2016 09:27   Dg Chest Port 1 View  Result Date: 11/29/2016 CLINICAL DATA:  Epigastric pain, nausea and vomiting today. History of hypertension, former smoker, bladder cancer. EXAM: PORTABLE CHEST 1 VIEW COMPARISON:  Chest radiograph September 21, 2016 FINDINGS: Cardiomediastinal silhouette is unremarkable for this low inspiratory examination with crowded vasculature markings. Mildly calcified aortic knob. The lungs are clear without pleural effusions or focal consolidations. Trachea projects midline and there is no pneumothorax. Re- demonstration of moderate hiatal hernia. Included soft tissue planes and osseous structures are non-suspicious. IMPRESSION: No acute cardiopulmonary process for this low inspiratory portable examination. Mild atherosclerosis. Electronically Signed   By: Elon Alas M.D.   On: 11/29/2016 14:58    Microbiology: Recent Results (from the past  240 hour(s))  Surgical PCR screen     Status: Abnormal   Collection Time: 11/30/16  8:28 AM  Result Value Ref Range Status   MRSA, PCR NEGATIVE NEGATIVE Final   Staphylococcus aureus POSITIVE (A) NEGATIVE Final    Comment:        The Xpert SA Assay (FDA approved for NASAL specimens in patients over 7 years of age), is one component of a comprehensive surveillance program.  Test performance has been validated by [facility] for patients greater than or equal to 18 year old. It is not intended to diagnose infection nor to guide or monitor treatment. RESULT CALLED TO, READ BACK BY AND VERIFIED WITH: HOBBS A. AT 1051A ON [id number] BY THOMPSON S.      Labs: Basic Metabolic Panel:  Recent Labs Lab 12/03/16 0624 12/03/16 1237 12/04/16 0617 12/05/16 0601 12/06/16 0643  NA 137 138 137 138 139   K 2.9* 3.2* 3.0* 2.9* 3.2*  CL 96* 97* 96* 101 104  CO2 32 31 32 26 27  GLUCOSE 94 115* 94 115* 98  BUN 27* 26* 24* 18 16  CREATININE 1.65* 1.59* 1.53* 1.30* 1.34*  CALCIUM 7.9* 8.1* 8.0* 8.1* 8.1*  MG  --  2.2  --   --  2.2   Liver Function Tests:  Recent Labs Lab 11/29/16 1508 12/01/16 0547 12/02/16 0534 12/03/16 0624 12/04/16 0617  AST 18 45* 33 23 24  ALT 17 41 34 29 27  ALKPHOS 47 46 39 46 52  BILITOT 1.3* 1.7* 1.3* 0.8 0.6  PROT 6.4* 5.5* 5.4* 5.9* 6.0*  ALBUMIN 3.8 2.8* 2.6* 2.7* 2.6*    Recent Labs Lab 11/29/16 1508  LIPASE 24   No results for input(s): AMMONIA in the last 168 hours. CBC:  Recent Labs Lab 12/02/16 1629 12/03/16 0624 12/04/16 0617 12/05/16 0601 12/06/16 0643  WBC 10.5 8.8 7.2 6.3 6.8  HGB 12.7* 12.6* 13.0 12.9* 12.9*  HCT 37.7* 37.1* 38.6* 38.2* 38.3*  MCV 90.8 90.9 91.7 91.0 90.3  PLT 119* 121* 145* 156 172   Cardiac Enzymes: No results for input(s): CKTOTAL, CKMB, CKMBINDEX, TROPONINI in the last 168 hours. BNP: BNP (last 3 results) No results for input(s): BNP in the last 8760 hours.  ProBNP (last 3 results) No results for input(s): PROBNP in the last 8760 hours.  CBG:  Recent Labs Lab 12/05/16 0727 12/05/16 1111 12/05/16 1609 12/05/16 2346 12/06/16 0818  GLUCAP 116* 118* 88 83 107*

## 2016-12-06 NOTE — Care Management Important Message (Signed)
Important Message  Patient Details  Name: Ronald Russell MRN: [id number] Date of Birth: 19-Sep-1934   Medicare Important Message Given:  Yes    Sherald Barge, RN 12/06/2016, 10:46 AM

## 2016-12-06 NOTE — Clinical Social Work Placement (Addendum)
   CLINICAL SOCIAL WORK PLACEMENT  NOTE  Date:  12/06/2016  Patient Details  Name: Ronald Russell MRN: [id number] Date of Birth: 16-Nov-1934  Clinical Social Work is seeking post-discharge placement for this patient at the [facility] level of care (*CSW will initial, date and re-position this form in  chart as items are completed):  Yes   Patient/family provided with [facility] Work Department's list of facilities offering this level of care within the geographic area requested by the patient (or if unable, by the patient's family).  Yes   Patient/family informed of their freedom to choose among providers that offer the needed level of care, that participate in Medicare, Medicaid or managed care program needed by the patient, have an available bed and are willing to accept the patient.  Yes   Patient/family informed of [facility]'s ownership interest in [facility] and [facility], as well as of the fact that they are under no obligation to receive care at these facilities.  PASRR submitted to EDS on 12/03/16     PASRR number received on 12/03/16     Existing PASRR number confirmed on       FL2 transmitted to all facilities in geographic area requested by pt/family on 12/03/16     FL2 transmitted to all facilities within larger geographic area on       Patient informed that his/her managed care company has contracts with or will negotiate with certain facilities, including the following:        Yes   Patient/family informed of bed offers received.  Patient chooses bed at [facility]     Physician recommends and patient chooses bed at [facility]    Patient to be transferred to [facility] on 12/06/16.  Patient to be transferred to facility by [facility]     Patient family notified on 12/06/16 of transfer.  Name of family member notified:  Neice: Mickel Baas     PHYSICIAN Please sign FL2     Additional Comment:     _______________________________________________ Lilly Cove, LCSW 12/06/2016, 10:46 AM

## 2016-12-07 ENCOUNTER — Encounter (HOSPITAL_COMMUNITY)
Admission: RE | Admit: 2016-12-07 | Discharge: 2016-12-07 | Disposition: A | Discharge status: Home / Self Care | Payer: PPO | Source: Skilled Nursing Facility | Attending: Internal Medicine | Admitting: Internal Medicine

## 2016-12-07 ENCOUNTER — Encounter: Payer: Self-pay | Admitting: Internal Medicine

## 2016-12-07 ENCOUNTER — Other Ambulatory Visit: Payer: Self-pay

## 2016-12-07 ENCOUNTER — Non-Acute Institutional Stay (SKILLED_NURSING_FACILITY): Payer: PPO | Admitting: Internal Medicine

## 2016-12-07 DIAGNOSIS — R05 Cough: Secondary | ICD-10-CM

## 2016-12-07 DIAGNOSIS — B0229 Other postherpetic nervous system involvement: Secondary | ICD-10-CM | POA: Insufficient documentation

## 2016-12-07 DIAGNOSIS — I10 Essential (primary) hypertension: Secondary | ICD-10-CM | POA: Diagnosis not present

## 2016-12-07 DIAGNOSIS — N289 Disorder of kidney and ureter, unspecified: Secondary | ICD-10-CM

## 2016-12-07 DIAGNOSIS — Z48815 Encounter for surgical aftercare following surgery on the digestive system: Secondary | ICD-10-CM | POA: Insufficient documentation

## 2016-12-07 DIAGNOSIS — K810 Acute cholecystitis: Secondary | ICD-10-CM | POA: Diagnosis not present

## 2016-12-07 DIAGNOSIS — D849 Immunodeficiency, unspecified: Secondary | ICD-10-CM | POA: Insufficient documentation

## 2016-12-07 DIAGNOSIS — R059 Cough, unspecified: Secondary | ICD-10-CM

## 2016-12-07 DIAGNOSIS — E559 Vitamin D deficiency, unspecified: Secondary | ICD-10-CM | POA: Insufficient documentation

## 2016-12-07 DIAGNOSIS — E876 Hypokalemia: Secondary | ICD-10-CM

## 2016-12-07 DIAGNOSIS — D696 Thrombocytopenia, unspecified: Secondary | ICD-10-CM | POA: Insufficient documentation

## 2016-12-07 LAB — CBC WITH DIFFERENTIAL/PLATELET
Basophils Absolute: 0.1 10*3/uL (ref 0.0–0.1)
Basophils Relative: 1 %
Eosinophils Absolute: 0.2 10*3/uL (ref 0.0–0.7)
Eosinophils Relative: 4 %
HCT: 38.8 % — ABNORMAL LOW (ref 39.0–52.0)
Hemoglobin: 13.1 g/dL (ref 13.0–17.0)
Lymphocytes Relative: 19 %
Lymphs Abs: 1.1 10*3/uL (ref 0.7–4.0)
MCH: 30.5 pg (ref 26.0–34.0)
MCHC: 33.8 g/dL (ref 30.0–36.0)
MCV: 90.2 fL (ref 78.0–100.0)
Monocytes Absolute: 0.8 10*3/uL (ref 0.1–1.0)
Monocytes Relative: 13 %
Neutro Abs: 3.8 10*3/uL (ref 1.7–7.7)
Neutrophils Relative %: 63 %
Platelets: 201 10*3/uL (ref 150–400)
RBC: 4.3 MIL/uL (ref 4.22–5.81)
RDW: 14.4 % (ref 11.5–15.5)
WBC: 6 10*3/uL (ref 4.0–10.5)

## 2016-12-07 LAB — BASIC METABOLIC PANEL
Anion gap: 8 (ref 5–15)
BUN: 15 mg/dL (ref 6–20)
CO2: 24 mmol/L (ref 22–32)
Calcium: 8.1 mg/dL — ABNORMAL LOW (ref 8.9–10.3)
Chloride: 106 mmol/L (ref 101–111)
Creatinine, Ser: 1.24 mg/dL (ref 0.61–1.24)
GFR calc Af Amer: >60 mL/min (ref >60)
GFR calc non Af Amer: 53 mL/min — ABNORMAL LOW (ref >60)
Glucose, Bld: 95 mg/dL (ref 65–99)
Potassium: 3.2 mmol/L — ABNORMAL LOW (ref 3.5–5.1)
Sodium: 138 mmol/L (ref 135–145)

## 2016-12-07 MED ORDER — LORAZEPAM 0.5 MG PO TABS: ORAL_TABLET | ORAL | 0 refills | Status: DC

## 2016-12-07 MED ORDER — TRAMADOL HCL 50 MG PO TABS: 50.0000 mg | ORAL_TABLET | Freq: Four times a day (QID) | ORAL | 0 refills | Status: DC | PRN
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
## 2016-12-07 NOTE — Telephone Encounter (Signed)
[place]

## 2016-12-07 NOTE — Telephone Encounter (Signed)
[place]

## 2016-12-08 ENCOUNTER — Non-Acute Institutional Stay (SKILLED_NURSING_FACILITY): Payer: PPO | Admitting: Internal Medicine

## 2016-12-08 ENCOUNTER — Ambulatory Visit (HOSPITAL_COMMUNITY): Payer: PPO | Attending: Family Medicine

## 2016-12-08 ENCOUNTER — Encounter: Payer: Self-pay | Admitting: Internal Medicine

## 2016-12-08 DIAGNOSIS — R05 Cough: Secondary | ICD-10-CM | POA: Insufficient documentation

## 2016-12-08 DIAGNOSIS — R441 Visual hallucinations: Secondary | ICD-10-CM

## 2016-12-08 DIAGNOSIS — K643 Fourth degree hemorrhoids: Secondary | ICD-10-CM

## 2016-12-08 DIAGNOSIS — J90 Pleural effusion, not elsewhere classified: Secondary | ICD-10-CM | POA: Diagnosis not present

## 2016-12-08 DIAGNOSIS — Z9049 Acquired absence of other specified parts of digestive tract: Secondary | ICD-10-CM | POA: Diagnosis not present

## 2016-12-08 DIAGNOSIS — E876 Hypokalemia: Secondary | ICD-10-CM

## 2016-12-08 DIAGNOSIS — J9811 Atelectasis: Secondary | ICD-10-CM | POA: Insufficient documentation

## 2016-12-08 DIAGNOSIS — E559 Vitamin D deficiency, unspecified: Secondary | ICD-10-CM

## 2016-12-08 DIAGNOSIS — R110 Nausea: Secondary | ICD-10-CM

## 2016-12-08 DIAGNOSIS — K449 Diaphragmatic hernia without obstruction or gangrene: Secondary | ICD-10-CM | POA: Insufficient documentation
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
## 2016-12-09 ENCOUNTER — Observation Stay (HOSPITAL_COMMUNITY)
Admission: EM | Admit: 2016-12-09 | Discharge: 2016-12-10 | Disposition: A | Discharge status: Home / Self Care | Payer: PPO | Attending: Internal Medicine | Admitting: Internal Medicine

## 2016-12-09 ENCOUNTER — Encounter: Payer: Self-pay | Admitting: Internal Medicine

## 2016-12-09 ENCOUNTER — Other Ambulatory Visit (HOSPITAL_COMMUNITY)
Admission: RE | Admit: 2016-12-09 | Discharge: 2016-12-09 | Disposition: A | Discharge status: Home / Self Care | Payer: PPO | Source: Skilled Nursing Facility | Attending: Internal Medicine | Admitting: Internal Medicine

## 2016-12-09 ENCOUNTER — Emergency Department (HOSPITAL_COMMUNITY): Payer: PPO

## 2016-12-09 ENCOUNTER — Encounter (HOSPITAL_COMMUNITY): Payer: Self-pay | Admitting: Cardiology

## 2016-12-09 ENCOUNTER — Non-Acute Institutional Stay (SKILLED_NURSING_FACILITY): Payer: PPO | Admitting: Internal Medicine

## 2016-12-09 DIAGNOSIS — I10 Essential (primary) hypertension: Secondary | ICD-10-CM | POA: Insufficient documentation

## 2016-12-09 DIAGNOSIS — R1011 Right upper quadrant pain: Secondary | ICD-10-CM | POA: Diagnosis not present

## 2016-12-09 DIAGNOSIS — Z79899 Other long term (current) drug therapy: Secondary | ICD-10-CM | POA: Diagnosis not present

## 2016-12-09 DIAGNOSIS — E876 Hypokalemia: Secondary | ICD-10-CM | POA: Diagnosis not present

## 2016-12-09 DIAGNOSIS — R05 Cough: Secondary | ICD-10-CM | POA: Diagnosis not present

## 2016-12-09 DIAGNOSIS — Z9049 Acquired absence of other specified parts of digestive tract: Secondary | ICD-10-CM | POA: Diagnosis not present

## 2016-12-09 DIAGNOSIS — E785 Hyperlipidemia, unspecified: Secondary | ICD-10-CM | POA: Diagnosis present

## 2016-12-09 DIAGNOSIS — K219 Gastro-esophageal reflux disease without esophagitis: Secondary | ICD-10-CM | POA: Diagnosis present

## 2016-12-09 DIAGNOSIS — Z87891 Personal history of nicotine dependence: Secondary | ICD-10-CM | POA: Insufficient documentation

## 2016-12-09 DIAGNOSIS — G8929 Other chronic pain: Secondary | ICD-10-CM | POA: Diagnosis present

## 2016-12-09 DIAGNOSIS — G8918 Other acute postprocedural pain: Secondary | ICD-10-CM

## 2016-12-09 DIAGNOSIS — R109 Unspecified abdominal pain: Secondary | ICD-10-CM | POA: Diagnosis present

## 2016-12-09 DIAGNOSIS — Z8551 Personal history of malignant neoplasm of bladder: Secondary | ICD-10-CM | POA: Diagnosis not present

## 2016-12-09 DIAGNOSIS — R0602 Shortness of breath: Secondary | ICD-10-CM | POA: Diagnosis not present

## 2016-12-09 LAB — COMPREHENSIVE METABOLIC PANEL
ALT: 25 U/L (ref 17–63)
AST: 22 U/L (ref 15–41)
Albumin: 3 g/dL — ABNORMAL LOW (ref 3.5–5.0)
Alkaline Phosphatase: 58 U/L (ref 38–126)
Anion gap: 8 (ref 5–15)
BUN: 12 mg/dL (ref 6–20)
CO2: 21 mmol/L — ABNORMAL LOW (ref 22–32)
Calcium: 8.5 mg/dL — ABNORMAL LOW (ref 8.9–10.3)
Chloride: 109 mmol/L (ref 101–111)
Creatinine, Ser: 1.28 mg/dL — ABNORMAL HIGH (ref 0.61–1.24)
GFR calc Af Amer: 59 mL/min — ABNORMAL LOW (ref >60)
GFR calc non Af Amer: 51 mL/min — ABNORMAL LOW (ref >60)
Glucose, Bld: 107 mg/dL — ABNORMAL HIGH (ref 65–99)
Potassium: 3.3 mmol/L — ABNORMAL LOW (ref 3.5–5.1)
Sodium: 138 mmol/L (ref 135–145)
Total Bilirubin: 0.7 mg/dL (ref 0.3–1.2)
Total Protein: 6.7 g/dL (ref 6.5–8.1)

## 2016-12-09 LAB — CBC WITH DIFFERENTIAL/PLATELET
Basophils Absolute: 0 10*3/uL (ref 0.0–0.1)
Basophils Relative: 1 %
Eosinophils Absolute: 0.2 10*3/uL (ref 0.0–0.7)
Eosinophils Relative: 3 %
HCT: 41.2 % (ref 39.0–52.0)
Hemoglobin: 14.1 g/dL (ref 13.0–17.0)
Lymphocytes Relative: 21 %
Lymphs Abs: 1.6 10*3/uL (ref 0.7–4.0)
MCH: 30.8 pg (ref 26.0–34.0)
MCHC: 34.2 g/dL (ref 30.0–36.0)
MCV: 90 fL (ref 78.0–100.0)
Monocytes Absolute: 0.6 10*3/uL (ref 0.1–1.0)
Monocytes Relative: 8 %
Neutro Abs: 5.2 10*3/uL (ref 1.7–7.7)
Neutrophils Relative %: 67 %
Platelets: 265 10*3/uL (ref 150–400)
RBC: 4.58 MIL/uL (ref 4.22–5.81)
RDW: 14.3 % (ref 11.5–15.5)
WBC: 7.6 10*3/uL (ref 4.0–10.5)

## 2016-12-09 LAB — URINALYSIS, ROUTINE W REFLEX MICROSCOPIC
Bilirubin Urine: NEGATIVE
Glucose, UA: NEGATIVE mg/dL
Hgb urine dipstick: NEGATIVE
Ketones, ur: NEGATIVE mg/dL
Leukocytes, UA: NEGATIVE
Nitrite: NEGATIVE
Protein, ur: NEGATIVE mg/dL
Specific Gravity, Urine: 1.016 (ref 1.005–1.030)
pH: 5 (ref 5.0–8.0)

## 2016-12-09 LAB — PROTIME-INR
INR: 1.05
Prothrombin Time: 13.8 seconds (ref 11.4–15.2)

## 2016-12-09 LAB — TSH: TSH: 5.33 u[IU]/mL — ABNORMAL HIGH (ref 0.350–4.500)

## 2016-12-09 MED ORDER — FENTANYL CITRATE (PF) 100 MCG/2ML IJ SOLN: 50.0000 ug | INTRAMUSCULAR | Status: DC | PRN

## 2016-12-09 MED ORDER — HYDROCORTISONE 1 % EX CREA
TOPICAL_CREAM | Freq: Once | CUTANEOUS | Status: AC
Start: 2016-12-09 — End: 2016-12-09
  Administered 2016-12-09: 1 via TOPICAL
  Filled 2016-12-09: qty 1.5

## 2016-12-09 MED ORDER — PIPERACILLIN-TAZOBACTAM 3.375 G IVPB
3.3750 g | Freq: Three times a day (TID) | INTRAVENOUS | Status: DC
  Administered 2016-12-10 (×2): 3.375 g via INTRAVENOUS
  Filled 2016-12-09 (×2): qty 50

## 2016-12-09 MED ORDER — LORAZEPAM 0.5 MG PO TABS
0.5000 mg | ORAL_TABLET | Freq: Two times a day (BID) | ORAL | Status: DC | PRN
  Administered 2016-12-09: 0.5 mg via ORAL
  Filled 2016-12-09: qty 1

## 2016-12-09 MED ORDER — HYDROCORTISONE 2.5 % RE CREA
TOPICAL_CREAM | Freq: Four times a day (QID) | RECTAL | Status: DC | PRN
  Administered 2016-12-10: 11:00:00 via RECTAL
  Filled 2016-12-09 (×2): qty 28.35

## 2016-12-09 MED ORDER — GUAIFENESIN ER 600 MG PO TB12
600.0000 mg | ORAL_TABLET | Freq: Two times a day (BID) | ORAL | Status: DC
  Administered 2016-12-09 – 2016-12-10 (×2): 600 mg via ORAL
  Filled 2016-12-09 (×2): qty 1

## 2016-12-09 MED ORDER — POTASSIUM CHLORIDE 2 MEQ/ML IV SOLN
INTRAVENOUS | Status: DC
  Administered 2016-12-10: 01:00:00 via INTRAVENOUS
  Filled 2016-12-09 (×2): qty 1000

## 2016-12-09 MED ORDER — ATENOLOL 25 MG PO TABS
50.0000 mg | ORAL_TABLET | Freq: Every day | ORAL | Status: DC
  Administered 2016-12-10: 50 mg via ORAL
  Filled 2016-12-09: qty 2

## 2016-12-09 MED ORDER — ONDANSETRON HCL 4 MG PO TABS: 4.0000 mg | ORAL_TABLET | Freq: Four times a day (QID) | ORAL | Status: DC | PRN

## 2016-12-09 MED ORDER — IOPAMIDOL (ISOVUE-300) INJECTION 61%
100.0000 mL | Freq: Once | INTRAVENOUS | Status: AC | PRN
  Administered 2016-12-09: 100 mL via INTRAVENOUS

## 2016-12-09 MED ORDER — LACTATED RINGERS IV BOLUS (SEPSIS)
1000.0000 mL | Freq: Once | INTRAVENOUS | Status: AC
  Administered 2016-12-09: 1000 mL via INTRAVENOUS

## 2016-12-09 MED ORDER — ENOXAPARIN SODIUM 40 MG/0.4ML ~~LOC~~ SOLN
40.0000 mg | SUBCUTANEOUS | Status: DC
  Administered 2016-12-09: 40 mg via SUBCUTANEOUS
  Filled 2016-12-09: qty 0.4

## 2016-12-09 MED ORDER — ENSURE ENLIVE PO LIQD: 237.0000 mL | Freq: Two times a day (BID) | ORAL | Status: DC

## 2016-12-09 MED ORDER — PIPERACILLIN-TAZOBACTAM 3.375 G IVPB 30 MIN
3.3750 g | Freq: Once | INTRAVENOUS | Status: AC
Start: 2016-12-09 — End: 2016-12-09
  Administered 2016-12-09: 3.375 g via INTRAVENOUS
  Filled 2016-12-09: qty 50

## 2016-12-09 MED ORDER — ONDANSETRON HCL 4 MG/2ML IJ SOLN: 4.0000 mg | Freq: Four times a day (QID) | INTRAMUSCULAR | Status: DC | PRN
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
## 2016-12-09 NOTE — Progress Notes (Signed)
Pharmacy Antibiotic Note  KRISTOFOR MICHALOWSKI is a 82 y.o. male admitted on 12/09/2016 with intra abdominal infection.  Pharmacy has been consulted for zosyn dosing.  Plan: Zosyn 3.375g IV q8h (4 hour infusion).  Height: 6' (182.9 cm) Weight: 186 lb (84.4 kg) IBW/kg (Calculated) : 77.6  Temp (24hrs), Avg:97.7 F (36.5 C), Min:97.5 F (36.4 C), Max:97.9 F (36.6 C)   Recent Labs Lab 12/04/16 0617 12/05/16 0601 12/06/16 0643 12/07/16 0616 12/09/16 0800  WBC 7.2 6.3 6.8 6.0 7.6  CREATININE 1.53* 1.30* 1.34* 1.24 1.28*    Estimated Creatinine Clearance: 50.5 mL/min (A) (by C-G formula based on SCr of 1.28 mg/dL (H)).    Allergies  Allergen Reactions  . Augmentin [Amoxicillin-Pot Clavulanate] Diarrhea  . Ciprofloxacin Nausea Only  . Codeine Other (See Comments)    Severe headache, nausea and vomiting  . Oxycodone Nausea And Vomiting  . Vicodin [Hydrocodone-Acetaminophen] Nausea And Vomiting     Thank you for allowing pharmacy to be a part of this patient's care.  Liz Beach 12/09/2016 10:12 PM

## 2016-12-09 NOTE — ED Notes (Signed)
Report given to Claire, RN

## 2016-12-09 NOTE — Progress Notes (Signed)
This encounter was created in error - please disregard.
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
## 2016-12-09 NOTE — ED Notes (Signed)
2 jellos given

## 2016-12-09 NOTE — ED Triage Notes (Addendum)
RUQ abdominal  pain with productive cough since 730 this morning  Also c/o sob
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
## 2016-12-09 NOTE — ED Notes (Signed)
T/c from [place], dtr, wanting update on pt status, advised again due to HIPPA unable to discuss pt information with her, call was to portable phone, advised I was at pt room and she could speak with him, she stated "Nevermind" and hung up
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
## 2016-12-10 ENCOUNTER — Observation Stay (HOSPITAL_COMMUNITY): Payer: PPO

## 2016-12-10 ENCOUNTER — Ambulatory Visit: Payer: PPO | Admitting: Internal Medicine

## 2016-12-10 DIAGNOSIS — K219 Gastro-esophageal reflux disease without esophagitis: Secondary | ICD-10-CM | POA: Diagnosis not present

## 2016-12-10 DIAGNOSIS — M1000 Idiopathic gout, unspecified site: Secondary | ICD-10-CM | POA: Diagnosis not present

## 2016-12-10 DIAGNOSIS — Z9049 Acquired absence of other specified parts of digestive tract: Secondary | ICD-10-CM

## 2016-12-10 DIAGNOSIS — I10 Essential (primary) hypertension: Secondary | ICD-10-CM

## 2016-12-10 DIAGNOSIS — H8110 Benign paroxysmal vertigo, unspecified ear: Secondary | ICD-10-CM | POA: Diagnosis not present

## 2016-12-10 DIAGNOSIS — E559 Vitamin D deficiency, unspecified: Secondary | ICD-10-CM | POA: Diagnosis not present

## 2016-12-10 DIAGNOSIS — E785 Hyperlipidemia, unspecified: Secondary | ICD-10-CM | POA: Diagnosis not present

## 2016-12-10 DIAGNOSIS — M6281 Muscle weakness (generalized): Secondary | ICD-10-CM | POA: Diagnosis not present

## 2016-12-10 DIAGNOSIS — B0229 Other postherpetic nervous system involvement: Secondary | ICD-10-CM | POA: Diagnosis not present

## 2016-12-10 DIAGNOSIS — R279 Unspecified lack of coordination: Secondary | ICD-10-CM | POA: Diagnosis not present

## 2016-12-10 DIAGNOSIS — N289 Disorder of kidney and ureter, unspecified: Secondary | ICD-10-CM | POA: Diagnosis not present

## 2016-12-10 DIAGNOSIS — H9190 Unspecified hearing loss, unspecified ear: Secondary | ICD-10-CM | POA: Diagnosis not present

## 2016-12-10 DIAGNOSIS — R739 Hyperglycemia, unspecified: Secondary | ICD-10-CM | POA: Diagnosis not present

## 2016-12-10 DIAGNOSIS — G8918 Other acute postprocedural pain: Secondary | ICD-10-CM | POA: Diagnosis not present

## 2016-12-10 DIAGNOSIS — E0781 Sick-euthyroid syndrome: Secondary | ICD-10-CM | POA: Diagnosis not present

## 2016-12-10 DIAGNOSIS — D696 Thrombocytopenia, unspecified: Secondary | ICD-10-CM | POA: Diagnosis not present

## 2016-12-10 DIAGNOSIS — R1011 Right upper quadrant pain: Secondary | ICD-10-CM | POA: Diagnosis not present

## 2016-12-10 DIAGNOSIS — Z48815 Encounter for surgical aftercare following surgery on the digestive system: Secondary | ICD-10-CM | POA: Diagnosis not present

## 2016-12-10 DIAGNOSIS — R0602 Shortness of breath: Secondary | ICD-10-CM | POA: Diagnosis not present

## 2016-12-10 DIAGNOSIS — R002 Palpitations: Secondary | ICD-10-CM | POA: Diagnosis not present

## 2016-12-10 DIAGNOSIS — D849 Immunodeficiency, unspecified: Secondary | ICD-10-CM | POA: Diagnosis not present

## 2016-12-10 DIAGNOSIS — R262 Difficulty in walking, not elsewhere classified: Secondary | ICD-10-CM | POA: Diagnosis not present

## 2016-12-10 DIAGNOSIS — F411 Generalized anxiety disorder: Secondary | ICD-10-CM | POA: Diagnosis not present

## 2016-12-10 LAB — CBC
HCT: 39.1 % (ref 39.0–52.0)
Hemoglobin: 13.1 g/dL (ref 13.0–17.0)
MCH: 30.2 pg (ref 26.0–34.0)
MCHC: 33.5 g/dL (ref 30.0–36.0)
MCV: 90.1 fL (ref 78.0–100.0)
Platelets: 220 10*3/uL (ref 150–400)
RBC: 4.34 MIL/uL (ref 4.22–5.81)
RDW: 14.1 % (ref 11.5–15.5)
WBC: 6.7 10*3/uL (ref 4.0–10.5)

## 2016-12-10 LAB — COMPREHENSIVE METABOLIC PANEL
ALT: 21 U/L (ref 17–63)
AST: 17 U/L (ref 15–41)
Albumin: 2.7 g/dL — ABNORMAL LOW (ref 3.5–5.0)
Alkaline Phosphatase: 48 U/L (ref 38–126)
Anion gap: 8 (ref 5–15)
BUN: 8 mg/dL (ref 6–20)
CO2: 22 mmol/L (ref 22–32)
Calcium: 8.1 mg/dL — ABNORMAL LOW (ref 8.9–10.3)
Chloride: 107 mmol/L (ref 101–111)
Creatinine, Ser: 1.11 mg/dL (ref 0.61–1.24)
GFR calc Af Amer: >60 mL/min (ref >60)
GFR calc non Af Amer: >60 mL/min (ref >60)
Glucose, Bld: 94 mg/dL (ref 65–99)
Potassium: 3.2 mmol/L — ABNORMAL LOW (ref 3.5–5.1)
Sodium: 137 mmol/L (ref 135–145)
Total Bilirubin: 0.5 mg/dL (ref 0.3–1.2)
Total Protein: 6 g/dL — ABNORMAL LOW (ref 6.5–8.1)

## 2016-12-10 LAB — VITAMIN D 25 HYDROXY (VIT D DEFICIENCY, FRACTURES): Vit D, 25-Hydroxy: 27.1 ng/mL — ABNORMAL LOW (ref 30.0–100.0)

## 2016-12-10 LAB — MRSA PCR SCREENING: MRSA by PCR: NEGATIVE

## 2016-12-10 MED ORDER — KCL IN DEXTROSE-NACL 20-5-0.9 MEQ/L-%-% IV SOLN
INTRAVENOUS | Status: AC
  Filled 2016-12-10: qty 1000

## 2016-12-10 MED ORDER — TECHNETIUM TC 99M MEBROFENIN IV KIT
5.0000 | PACK | Freq: Once | INTRAVENOUS | Status: AC | PRN
  Administered 2016-12-10: 5.5 via INTRAVENOUS

## 2016-12-10 MED ORDER — KCL IN DEXTROSE-NACL 20-5-0.9 MEQ/L-%-% IV SOLN
INTRAVENOUS | Status: DC
  Administered 2016-12-10: 10:00:00 via INTRAVENOUS

## 2016-12-10 MED ORDER — HYDROCORTISONE 1 % EX CREA
TOPICAL_CREAM | Freq: Four times a day (QID) | CUTANEOUS | 0 refills | Status: DC | PRN
Start: 2016-12-10 — End: 2017-07-11

## 2016-12-10 MED ORDER — HYDROCORTISONE 1 % EX CREA
TOPICAL_CREAM | Freq: Three times a day (TID) | CUTANEOUS | Status: DC
  Filled 2016-12-10: qty 1.5

## 2016-12-10 MED ORDER — WHITE PETROLATUM GEL: 1.0000 "application " | 0 refills | Status: DC | PRN

## 2016-12-10 MED ORDER — WHITE PETROLATUM GEL
Status: DC | PRN
  Filled 2016-12-10: qty 28.35

## 2016-12-10 MED ORDER — ENSURE ENLIVE PO LIQD: 237.0000 mL | Freq: Two times a day (BID) | ORAL | 0 refills | Status: DC

## 2016-12-10 MED ORDER — HYDROCORTISONE ACETATE 25 MG RE SUPP: 25.0000 mg | Freq: Two times a day (BID) | RECTAL | Status: DC
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
## 2016-12-10 NOTE — Progress Notes (Signed)
Initial Nutrition Assessment  DOCUMENTATION CODES:  Not applicable  INTERVENTION:  None- Pt is discharging to SNF. Note started prior to D/C orders being placed. Document of time  NUTRITION DIAGNOSIS:  Unintentional weight loss related to acute illness (cholecystitis s/p cholecystectomy), poor appetite, nausea, Severe diarrhea, hemmerhoids as evidenced by Loss of 5% bw in about 2 weeks  GOAL:  Patient will meet greater than or equal to 90% of their needs  MONITOR:  PO intake, Supplement acceptance, Labs, Weight trends  REASON FOR ASSESSMENT:  Malnutrition Screening Tool    ASSESSMENT:  82 y/o male, PMHx Anxiety, GERD, HOH, HTN. Recently hospitalized for acute cholecystitis s/p Laparoscopic cholecystectomy 6/19. Post op course complicated by pleural effusion. Discharged to SNF. Represented due to abdominal pain. CT showed fluid collection in gallbladder fossa and and admitted for observation.   Pt had been followed by this RD at the [facility]. When first arrived to SNF, had been eating poorly (<50%) due to severe diarrhea (pt and niece attributed to colchicine) and hemorrhoids. . However, he soon started eating much better and had been consuming 75-1005 of most meals since 6/26. Supps he was receiving at SNF included potassium aminobenzoate & Vit D3.   Last Weight at SNF was 187 lbs. He has lost 10 lbs in since admitted on 6/18. This is a clinically significant loss. Reported UBW as 195 lbs.   At this time, pt is being discharged. No interventions warranted. RD will follow up on arrival back to SNF  Physical Exam: N/A  Meds: Ensure, IVF. Labs: Albumin: 2.7, K: 3.2   Recent Labs Lab 12/03/16 1237  12/06/16 0643 12/07/16 0616 12/09/16 0800 12/10/16 0555  NA 138  < > 139 138 138 137  K 3.2*  < > 3.2* 3.2* 3.3* 3.2*  CL 97*  < > 104 106 109 107  CO2 31  < > 27 24 21* 22  BUN 26*  < > 16 15 12 8   CREATININE 1.59*  < > 1.34* 1.24 1.28* 1.11  CALCIUM 8.1*  < > 8.1*  8.1* 8.5* 8.1*  MG 2.2  --  2.2  --   --   --   GLUCOSE 115*  < > 98 95 107* 94  < > = values in this interval not displayed.  Diet Order:  Diet regular Room service appropriate? Yes; Fluid consistency: Thin Diet - low sodium heart healthy  Skin: Surgical incision to abdomen. Abrasions to arms and legs  Last BM:  6/28  Height:  Ht Readings from Last 1 Encounters:  12/09/16 6' (1.829 m)   Weight:  Wt Readings from Last 1 Encounters:  12/09/16 188 lb 12.8 oz (85.6 kg)   Wt Readings from Last 10 Encounters:  12/09/16 188 lb 12.8 oz (85.6 kg)  12/03/16 196 lb 13.9 oz (89.3 kg)  11/16/16 204 lb (92.5 kg)  09/24/16 199 lb 12.8 oz (90.6 kg)  09/20/16 200 lb (90.7 kg)  09/01/16 195 lb 11.2 oz (88.8 kg)  08/09/16 200 lb (90.7 kg)  07/23/16 198 lb (89.8 kg)  06/04/16 196 lb 4 oz (89 kg)  06/02/16 190 lb (86.2 kg)  Admit weight 186 lbs.   Ideal Body Weight:  80.91 kg  BMI:  Body mass index is 25.61 kg/m.  Estimated Nutritional Needs:  Kcal:  1950-2100 (23-25 kcal/kg bw) Protein:  85-100g (1-1.2 g/kg bw) Fluid:  >2.1 Liter (25 ml/kg bw)  EDUCATION NEEDS:  No education needs identified at this time  Burtis Junes  RD, LDN, CNSC Clinical Nutrition Pager: [phone] 12/10/2016 12:28 PM

## 2016-12-10 NOTE — Final Progress Note (Signed)
Late Entry  Notified the MD due to the patients/family concern about the abx order scheduled for 12 today,rectal supp for hemorrhoids, and d/c time.  New orders given and followed.  The patients niece had many questions and they all was answered to the best of my ability r/t to test, bm consistancy, etc.  MD was notified as needed.   D/C note: Report was given to Leda Roys over at the [facility].  She verbalized understanding.  Patient was transferred over via w/c in stable condition.

## 2016-12-10 NOTE — Care Management Obs Status (Signed)
[facility] NOTIFICATION   Patient Details  Name: Ronald Russell MRN: [id number] Date of Birth: [birth year]   Medicare Observation Status Notification Given:  Other (see comment) (pt DC'd <24 hrs)    Sherald Barge, RN 12/10/2016, 12:16 PM

## 2016-12-10 NOTE — Clinical Social Work Note (Addendum)
LCSW spoke with [employer]. His auth number [id number].       Artha Stavros, Clydene Pugh, LCSW

## 2016-12-11 ENCOUNTER — Other Ambulatory Visit: Payer: Self-pay | Admitting: Family Medicine

## 2016-12-11 ENCOUNTER — Non-Acute Institutional Stay (SKILLED_NURSING_FACILITY): Payer: PPO | Admitting: Internal Medicine

## 2016-12-11 DIAGNOSIS — K810 Acute cholecystitis: Secondary | ICD-10-CM

## 2016-12-11 DIAGNOSIS — K649 Unspecified hemorrhoids: Secondary | ICD-10-CM

## 2016-12-11 DIAGNOSIS — F411 Generalized anxiety disorder: Secondary | ICD-10-CM | POA: Diagnosis not present

## 2016-12-11 DIAGNOSIS — G8918 Other acute postprocedural pain: Secondary | ICD-10-CM | POA: Diagnosis not present

## 2016-12-11 DIAGNOSIS — I10 Essential (primary) hypertension: Secondary | ICD-10-CM | POA: Diagnosis not present

## 2016-12-11 DIAGNOSIS — R1011 Right upper quadrant pain: Secondary | ICD-10-CM | POA: Diagnosis not present

## 2016-12-11 DIAGNOSIS — E876 Hypokalemia: Secondary | ICD-10-CM

## 2016-12-11 NOTE — Telephone Encounter (Signed)
He will need to contact surgeon
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
## 2016-12-11 NOTE — Telephone Encounter (Signed)
What is the name of the medication? Zofran  Have you contacted your pharmacy to request a refill? NO  Which pharmacy would you like this sent to? [facility] in [place]   Patient notified that their request is being sent to the clinical staff for review and that they should receive a call once it is complete. If they do not receive a call within 24 hours they can check with their pharmacy or our office.

## 2016-12-13 ENCOUNTER — Other Ambulatory Visit: Payer: Self-pay

## 2016-12-13 ENCOUNTER — Encounter: Payer: Self-pay | Admitting: Family Medicine

## 2016-12-13 ENCOUNTER — Ambulatory Visit (INDEPENDENT_AMBULATORY_CARE_PROVIDER_SITE_OTHER): Payer: PPO | Admitting: Family Medicine

## 2016-12-13 VITALS — BP 133/74 | HR 63 | Temp 97.9°F | Ht 72.0 in | Wt 190.0 lb

## 2016-12-13 DIAGNOSIS — R1084 Generalized abdominal pain: Secondary | ICD-10-CM | POA: Diagnosis not present

## 2016-12-13 DIAGNOSIS — R197 Diarrhea, unspecified: Secondary | ICD-10-CM

## 2016-12-13 MED ORDER — TRAMADOL HCL 50 MG PO TABS: 50.0000 mg | ORAL_TABLET | Freq: Four times a day (QID) | ORAL | 0 refills | Status: DC | PRN

## 2016-12-13 NOTE — Progress Notes (Signed)
Subjective:  Patient ID: Ronald Russell, male    DOB: 1934/11/05  Age: 82 y.o. MRN: [id number]  CC: Hospitalization Follow-up (pt here today following up after leaving Hospital AMA )   HPI Ronald Russell presents for [facility] after hospital discharge from gallbladder surgery. Ronald Russell was put in rehabilitation and developed recurrent right lower upper quadrant pain. Scan of the hepatobiliary tree showed normal function but some air-fluid level that possibly represented infection. Ronald Russell was readmitted to the hospital but further testing disproved infection. However Ronald Russell developed diarrhea. And Ronald Russell was taken off of antibiotics. Ronald Russell now says that his diarrhea has resolved. Ronald Russell had solid bowel movements last night and again this morning. No frequency of bowels. An right upper quadrant pain has diminished to minimal as well.  Depression screen [facility] 2/9 12/13/2016 11/16/2016 09/20/2016  Decreased Interest 0 0 0  Down, Depressed, Hopeless 0 0 0  PHQ - 2 Score 0 0 0  Altered sleeping - - -  Tired, decreased energy - - -  Change in appetite - - -  Feeling bad or failure about yourself  - - -  Trouble concentrating - - -  Moving slowly or fidgety/restless - - -  Suicidal thoughts - - -  PHQ-9 Score - - -  Difficult doing work/chores - - -  Some recent data might be hidden    History Ronald Russell has a past medical history of Allergy; Anxiety; Bladder cancer ([place]) (2010); Cataract; Chronic bronchitis ([place]); Clotting disorder ([place]); Exertional shortness of breath; GERD (gastroesophageal reflux disease); History of blood transfusion (1949); HOH (hard of hearing); Hypertension; Ocular migraine; Pneumonia; PONV (postoperative nausea and vomiting); Seasonal allergies; Shingles; and Temporal arteritis ([place]).   Ronald Russell has a past surgical history that includes Transurethral resection of bladder tumor (2010 X 3); mastoid tumor removed (Right, 1964); Tonsillectomy (1940's); Skin graft (Right, 1949); Colonoscopy; Vasectomy; Artery Biopsy  (Left, 01/09/2013); Cardiovascular stress test (07/2011); and Cholecystectomy (N/A, 11/30/2016).   His family history includes Alzheimer's disease in his father; Cancer in his mother.Ronald Russell reports that Ronald Russell has quit smoking. His smoking use included Cigarettes. Ronald Russell has a 2.25 pack-year smoking history. Ronald Russell has never used smokeless tobacco. Ronald Russell reports that Ronald Russell does not drink alcohol or use drugs.    ROS Review of Systems  Constitutional: Negative for chills, diaphoresis, fever and unexpected weight change.  HENT: Negative for congestion, hearing loss, rhinorrhea and sore throat.   Eyes: Negative for visual disturbance.  Respiratory: Negative for cough and shortness of breath.   Cardiovascular: Negative for chest pain.  Gastrointestinal: Positive for abdominal pain. Negative for constipation and diarrhea.  Genitourinary: Negative for dysuria and flank pain.  Musculoskeletal: Negative for arthralgias and joint swelling.  Skin: Negative for rash.  Neurological: Negative for dizziness and headaches.  Psychiatric/Behavioral: Negative for dysphoric mood and sleep disturbance.    Objective:  BP 133/74   Pulse 63   Temp 97.9 F (36.6 C) (Oral)   Ht 6' (1.829 m)   Wt 190 lb (86.2 kg)   BMI 25.77 kg/m   BP Readings from Last 3 Encounters:  12/13/16 133/74  12/11/16 109/63  12/10/16 126/64    Wt Readings from Last 3 Encounters:  12/13/16 190 lb (86.2 kg)  12/09/16 188 lb 12.8 oz (85.6 kg)  12/03/16 196 lb 13.9 oz (89.3 kg)     Physical Exam  Constitutional: Ronald Russell is oriented to person, place, and time. Ronald Russell appears well-developed and well-nourished. No distress.  HENT:  Head: Normocephalic and atraumatic.  Right Ear: External ear normal.  Left Ear: External ear normal.  Nose: Nose normal.  Mouth/Throat: Oropharynx is clear and moist.  Eyes: Conjunctivae and EOM are normal. Pupils are equal, round, and reactive to light.  Neck: Normal range of motion. Neck supple. No thyromegaly present.    Cardiovascular: Normal rate, regular rhythm and normal heart sounds.   No murmur heard. Pulmonary/Chest: Effort normal and breath sounds normal. No respiratory distress. Ronald Russell has no wheezes. Ronald Russell has no rales.  Abdominal: Soft. Bowel sounds are normal. Ronald Russell exhibits no distension. There is tenderness (ild and diffuse. Steri-Strips noted over the laparoscopic incisions. Lesions healing).  Lymphadenopathy:    Ronald Russell has no cervical adenopathy.  Neurological: Ronald Russell is alert and oriented to person, place, and time. Ronald Russell has normal reflexes.  Skin: Skin is warm and dry.  Psychiatric: Ronald Russell has a normal mood and affect. His behavior is normal. Judgment and thought content normal.      Assessment & Plan:   [name] was seen today for hospitalization follow-up.  Diagnoses and all orders for this visit:  Diarrhea, unspecified type  Generalized abdominal pain       I have discontinued Ronald Russell white petrolatum, feeding supplement (ENSURE ENLIVE), and traMADol. I am also having him maintain his fexofenadine, omeprazole, febuxostat, pravastatin, atenolol, acetaminophen, potassium chloride SA, ondansetron, hydrocortisone cream, and LORazepam.  Allergies as of 12/13/2016      Reactions   Augmentin [amoxicillin-pot Clavulanate] Diarrhea   Ciprofloxacin Nausea Only   Codeine Other (See Comments)   Severe headache, nausea and vomiting   Oxycodone Nausea And Vomiting   Vicodin [hydrocodone-acetaminophen] Nausea And Vomiting      Medication List       Accurate as of 12/13/16 11:06 PM. Always use your most recent med list.          acetaminophen 325 MG tablet Commonly known as:  TYLENOL Take 2 tablets (650 mg total) by mouth every 6 (six) hours as needed for mild pain (or Fever >/= 101).   atenolol 50 MG tablet Commonly known as:  TENORMIN Take 1 tablet (50 mg total) by mouth daily.   febuxostat 40 MG tablet Commonly known as:  ULORIC Take 1 tablet (40 mg total) by mouth daily. To prevent gout    fexofenadine 180 MG tablet Commonly known as:  ALLEGRA Take 180 mg by mouth every morning.   hydrocortisone cream 1 % Apply topically 4 (four) times daily as needed for itching (hemorrhoids).   LORazepam 0.5 MG tablet Commonly known as:  ATIVAN   omeprazole 40 MG capsule Commonly known as:  PRILOSEC TAKE 1 CAPSULE (40 MG TOTAL) BY MOUTH 2 (TWO) TIMES DAILY.   ondansetron 4 MG tablet Commonly known as:  ZOFRAN Take 4 mg by mouth every 4 (four) hours. Until 12/15/2016   potassium chloride SA 20 MEQ tablet Commonly known as:  K-DUR,KLOR-CON Take 20 mEq by mouth daily.   pravastatin 40 MG tablet Commonly known as:  PRAVACHOL Take 40 mg by mouth daily.        Follow-up: Return in about 2 weeks (around 12/27/2016).  Claretta Fraise, M.D.

## 2016-12-13 NOTE — Telephone Encounter (Signed)
RX faxed to [facility] @ [phone]. Phone number [phone]  

## 2016-12-13 NOTE — Telephone Encounter (Signed)
Patient aware.

## 2016-12-16 ENCOUNTER — Other Ambulatory Visit: Payer: Self-pay | Admitting: *Deleted

## 2016-12-16 NOTE — Patient Outreach (Signed)
[place] [facility]) Care Management  12/16/2016  TAMARION HAYMOND 11-06-1934 [id number]  Received referral from [facility] utilization management -Santiago Glad -post acute UM: reason for referral -"Pt left AMA from SNF after 1 day 6/30; recent hospital stay."  Telephone call to [facility] was received that patient has left [facility] 12/10/2016.   Telephone call to patient; person answered call & advised that patient was not there & call was disconnected.  Return call to number-no answer and unable to leave message to request call back.  Plan: Will follow up.  Sherrin Daisy, RN BSN [employer] Management Coordinator [facility] Care Management  [phone]

## 2016-12-17 ENCOUNTER — Other Ambulatory Visit: Payer: Self-pay | Admitting: *Deleted

## 2016-12-17 NOTE — Patient Outreach (Signed)
[facility]) Care Management  12/17/2016  Ronald Russell 02-02-1935 [id number]  Telephone call x 2;  call answered by patient who gave HIPPA verification. Patient was advised of reason for call & [facility] care management services.   Patient voices that he recently left skilled nursing facility  against medical advice after being there for 1 day on 06/30.   States he had recently had removal of infected gallbladder at [facility] prior to transfer to skilled facility for rehabilitation. States he was advised that rehabilitation services was not starting  for several days after admission. States he was not happy about that nor satisfied with other services at skilled facility.     States he has already had follow up appointment with his primary care provider-Dr. Claretta Fraise on 07/02. States he plans to make appointment to see surgeon-Dr. Arnoldo Morale in several weeks.   States he has all of his medications & is taking as prescribed by his doctor. States [facility]. Patient voices that he is caring for himself & niece helps him with transportation to doctor's appointments & buying his groceries. Voices that he is restricted from driving currently.  States he is independent with his care & is doing well with it.  Voices he is walking without assistance and walks daily the length of his home. States he has had no falls.   Patient voices that he knows to report to MD if he suspects infection of wound (aware of symptoms of fever, severe pain, redness , purulent drainage).  Offered [facility] services. States he does not need anything but someone to clean home & fix meals. States he did not need RN or Tourist information centre manager.  Advised patient of referral to Clinical Social worker for possible Air cabin crew..  Patient consents to referral to Clinical Social worker for community resources such as meals on wheels. Declines referral to community/telephonic care coordinators.    Plan: Advise care management assistant to assign Clinical social worker. Telephonic signing off.    Sherrin Daisy, RN BSN [place] Management Coordinator [facility] Care Management  [phone]

## 2016-12-20 ENCOUNTER — Other Ambulatory Visit: Payer: Self-pay | Admitting: Licensed Clinical Social Worker

## 2016-12-20 NOTE — Patient Outreach (Signed)
[name]) Care Management  12/20/2016  Ronald Russell 05-21-1935 [id number]  Assessment- CSW received new referral on 12/20/16 for patient who is needing Mobile [employer] after discharging from SNF. Patient refused needing nursing services.  CSW completed initial outreach to patient on 12/20/16. Patient answered and provided HIPPA verifications. CSW introduced self, reason for call and of social work services. Patient reports that he recently left a SNF (against medical advice) and that he is having difficulty preparing meals at this time. CSW informed patient that [name] has a contract with [employer] and can get him Mobile Meals for 30 days to help with his transition back home. Patient agrees to this. CSW educated patient on [employer] program and informed him that at this time they are not accepting patients on their wait list but they have a inquiry list that they can put him on. Patient agreeable to this but states "I'm not sure if I will need it but 30 days. I get around pretty good." CSW completed call to [employer] and successfully placed patient on the inquiry list. Patient understands that program will contact him when they are able to put him on the wait list. CSW completed call to Angie with [employer] but was unable to reach her. CSW left a HIPPA compliant voice message encouraging return call in order for patient to gain 30 days of Mobile Meals through [facility] contract. CSW reviewed other community resources. Patient denies needing senior resources, mental health resources, wanting to change advance directives, personal care resources and financial resources. Patient shares that he is only interested in gaining cleaning resources. CSW provided education on available cleaning agencies but informed him that there are no current resources for this and that all services are private pay. Patient is willing to pay for services and is agreeable to CSW mailing out cleaning resources.  Patient denies needing any further social work needs. Case will not be opened.  CSW will await to hear back from [employer] in order for patient to gain 30 days of Mobile Meals through [facility] contract.  Plan-CSW will not open case. CSW will mail cleaning resources to patient.  [name], BSW, MSW, [name] Phone: [phone] Fax: [phone]

## 2016-12-21 ENCOUNTER — Other Ambulatory Visit: Payer: Self-pay | Admitting: Licensed Clinical Social Worker

## 2016-12-21 NOTE — Patient Outreach (Signed)
[facility]) Care Management  12/21/2016  Ronald Russell [birth year] [id number]  Assessment- CSW received return call from [place] with [employer]. She informed CSW that patient can only receive 20 frozen Mobile Meals due to living in [place] and outside of the city limits. CSW completed call to patient while on the line with Angie with [employer]. Patient's address was pulled up in the [employer] system and it was confirmed that he resides in [place] instead of [place]. [employer] [facility] [place] is unable to provide any meals through the [facility] contract due to this. Patient expressed understanding. CSW completed call to [facility] and successfully placed patient on the wait list which is up to 14 months. Patient was informed that he can privately pay for [employer] until he is off the wait list which cost $90 per month or $4.95 per meal. Patient appreciative of this information.   Plan-CSW will complete case closure at this time and will update [facility] Care Management Assistant.  [name], BSW, MSW, [email or web address] Phone: [phone] Fax: [phone]

## 2016-12-21 NOTE — Patient Outreach (Signed)
Ronald Russell) Care Management  12/21/2016  [name] Russell 1935/05/26 [id number]  Assessment- CSW completed additional attempt to [facility] in order to make referral for patient to get 30 days of Mobile Meals through [facility] contact. CSW unable to reach Ronald Russell. CSW left an additional voice message requesting a return call.  Plan-CSW will await for return call or complete additional outreach within one week.  Ronald Russell, BSW, MSW, [email or web address] Phone: [phone] Fax: [phone]

## 2016-12-24 ENCOUNTER — Ambulatory Visit (INDEPENDENT_AMBULATORY_CARE_PROVIDER_SITE_OTHER): Payer: PPO | Admitting: Family Medicine

## 2016-12-24 ENCOUNTER — Encounter: Payer: Self-pay | Admitting: Family Medicine

## 2016-12-24 VITALS — BP 113/72 | HR 65 | Temp 97.7°F | Ht 72.0 in | Wt 187.0 lb

## 2016-12-24 DIAGNOSIS — R05 Cough: Secondary | ICD-10-CM | POA: Diagnosis not present

## 2016-12-24 DIAGNOSIS — R059 Cough, unspecified: Secondary | ICD-10-CM

## 2016-12-24 DIAGNOSIS — R0982 Postnasal drip: Secondary | ICD-10-CM | POA: Diagnosis not present

## 2016-12-24 MED ORDER — BENZONATATE 100 MG PO CAPS: 100.0000 mg | ORAL_CAPSULE | Freq: Three times a day (TID) | ORAL | 0 refills | Status: DC | PRN

## 2016-12-24 MED ORDER — FLUTICASONE PROPIONATE 50 MCG/ACT NA SUSP: 2.0000 | Freq: Every day | NASAL | 6 refills | Status: DC

## 2016-12-24 NOTE — Progress Notes (Signed)
   HPI  Patient presents today here with congestion.  Patient asked advice about diet after cholecystectomy, this was discussed.  He also has a firm vein on his right distal forearm, he states this is the site of an IV about one week ago. This is gradually improving.  Patient complains of some mild shortness of breath with lying down on his left side, he states that he coughs and feels like he can breathe better. His cough is dry. He also complains of some hoarseness and audible wheezing. He's been using an incentive spirometer regularly.  He denies fever, chills, sweats. He denies chest pain. He's tolerating food and fluids as usual.   PMH: Smoking status noted ROS: Per HPI  Objective: BP 113/72   Pulse 65   Temp 97.7 F (36.5 C) (Oral)   Ht 6' (1.829 m)   Wt 187 lb (84.8 kg)   BMI 25.36 kg/m  Gen: NAD, alert, cooperative with exam HEENT: NCAT, oral mucosa moist and clear, oropharynx difficult to visualize, nares with erythema, boggy mucosa, and swollen turbinates. CV: RRR, good S1/S2, no murmur Resp:  Nonlabored, no wheezes, very faint crackles that change from breath to breath on the left lower lung field Abd: SNTND, BS present, no guarding or organomegaly Ext: No edema, warm Neuro: Alert and oriented, No gross deficits  Assessment and plan:  # Cough, postnasal drip Suspect postnasal drip and possible mild viral orchitis. His lung exam is reassuring, he does state that while hospitalized he had some pulmonary edema treated with Lasix. There are faint soft crackles that change from breath to breath that could possibly represent a small pleural effusion but I believe likely rather represents atelectasis. Continue incentive spirometer Tessalon Perles Start Flonase for postnasal drip Hoarseness also possibly related to being intubated for surgery. The change in breathing with coughing on his left side could represent mucus plugging and clearing with cough.  Follow-up  with chest x-ray Monday morning, this is Friday night clinic, if symptoms are persistent.   Meds ordered this encounter  Medications  . fluticasone (FLONASE) 50 MCG/ACT nasal spray    Sig: Place 2 sprays into both nostrils daily.    Dispense:  16 g    Refill:  6  . benzonatate (TESSALON PERLES) 100 MG capsule    Sig: Take 1 capsule (100 mg total) by mouth 3 (three) times daily as needed for cough.    Dispense:  20 capsule    Refill:  [place], MD [facility] 12/24/2016, 6:52 PM

## 2016-12-24 NOTE — Patient Instructions (Signed)
Great to see you!  Come back Monday morning for an x ray if you are still coughing as you are now.   Use flonase 2 sprays per nostril daily.   Try benzonatate for cough

## 2016-12-28 ENCOUNTER — Ambulatory Visit (INDEPENDENT_AMBULATORY_CARE_PROVIDER_SITE_OTHER): Payer: PPO | Admitting: Family Medicine

## 2016-12-28 ENCOUNTER — Encounter: Payer: Self-pay | Admitting: Family Medicine

## 2016-12-28 ENCOUNTER — Ambulatory Visit (INDEPENDENT_AMBULATORY_CARE_PROVIDER_SITE_OTHER): Payer: PPO

## 2016-12-28 VITALS — BP 101/59 | HR 60 | Temp 97.2°F | Ht 72.0 in | Wt 188.0 lb

## 2016-12-28 DIAGNOSIS — J40 Bronchitis, not specified as acute or chronic: Secondary | ICD-10-CM

## 2016-12-28 DIAGNOSIS — R059 Cough, unspecified: Secondary | ICD-10-CM

## 2016-12-28 DIAGNOSIS — J329 Chronic sinusitis, unspecified: Secondary | ICD-10-CM

## 2016-12-28 DIAGNOSIS — R05 Cough: Secondary | ICD-10-CM | POA: Diagnosis not present

## 2016-12-28 MED ORDER — LEVOFLOXACIN 500 MG PO TABS: 500.0000 mg | ORAL_TABLET | Freq: Every day | ORAL | 0 refills | Status: DC

## 2016-12-28 NOTE — Progress Notes (Signed)
Subjective:  Patient ID: Ronald Russell, male    DOB: 1935-03-10  Age: 82 y.o. MRN: [id number]  CC: Cough (pt here today c/o cough and the Tessalon Pearles make him feel groggy)   HPI Ronald Russell presents for Patient seen 4 days ago for a cough. 4 weeks ago he had gallbladder surgery and was intubated. He is getting more short of breath since being seen 4 days ago. The cough has worsened as well and is now constant and deep. He does not have any abdominal discomfort. He has no fever chills sweats and no productivity from the cough. Tessalon Perles don't help and they also make him feel groggy. His niece is with him today and says that he is having some memory issues as well.  Depression screen [facility] 2/9 12/28/2016 12/24/2016 12/13/2016  Decreased Interest 0 0 0  Down, Depressed, Hopeless 0 0 0  PHQ - 2 Score 0 0 0  Altered sleeping - - -  Tired, decreased energy - - -  Change in appetite - - -  Feeling bad or failure about yourself  - - -  Trouble concentrating - - -  Moving slowly or fidgety/restless - - -  Suicidal thoughts - - -  PHQ-9 Score - - -  Difficult doing work/chores - - -  Some recent data might be hidden    History Ronald Russell has a past medical history of Allergy; Anxiety; Bladder cancer ([place]) (2010); Cataract; Chronic bronchitis ([place]); Clotting disorder ([place]); Exertional shortness of breath; GERD (gastroesophageal reflux disease); History of blood transfusion (1949); HOH (hard of hearing); Hypertension; Ocular migraine; Pneumonia; PONV (postoperative nausea and vomiting); Seasonal allergies; Shingles; and Temporal arteritis ([place]).   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   His family history includes  Alzheimer's disease in his father; Cancer in his mother.He reports that he has quit smoking. His smoking use included Cigarettes. He has a 2.25 pack-year smoking history. He has never used smokeless tobacco. He reports that he does not drink alcohol or use drugs.    ROS Review of Systems  Constitutional: Negative for activity change, appetite change, chills and fever.  HENT: Positive for congestion, postnasal drip, rhinorrhea and sinus pressure. Negative for ear discharge, ear pain, hearing loss, nosebleeds, sneezing and trouble swallowing.   Respiratory: Negative for chest tightness and shortness of breath.   Cardiovascular: Negative for chest pain and palpitations.  Skin: Negative for rash.  Psychiatric/Behavioral:       Niece concerned about forgetfulness     Objective:  BP (!) 101/59   Pulse 60   Temp (!) 97.2 F (36.2 C) (Oral)   Ht 6' (1.829 m)   Wt 188 lb (85.3 kg)   BMI 25.50 kg/m   BP Readings from Last 3 Encounters:  12/28/16 (!) 101/59  12/24/16 113/72  12/13/16 133/74    Wt Readings from Last 3 Encounters:  12/28/16 188 lb (85.3 kg)  12/24/16 187 lb (84.8 kg)  12/13/16 190 lb (86.2 kg)     Physical Exam  Constitutional: He appears well-developed and well-nourished.  HENT:  Head: Normocephalic and atraumatic.  Right Ear: Tympanic membrane and external ear normal. No decreased hearing is noted.  Left Ear: Tympanic membrane and external ear normal. No decreased hearing is noted.  Nose: Mucosal  edema present. Right sinus exhibits no frontal sinus tenderness. Left sinus exhibits no frontal sinus tenderness.  Mouth/Throat: No oropharyngeal exudate or posterior oropharyngeal erythema.  Neck: No Brudzinski's sign noted.  Pulmonary/Chest: Breath sounds normal. No respiratory distress.  Lymphadenopathy:       Head (right side): No preauricular adenopathy present.       Head (left side): No preauricular adenopathy present.       Right cervical: No superficial  cervical adenopathy present.      Left cervical: No superficial cervical adenopathy present.  Skin: Skin is warm and dry.      Assessment & Plan:   Ronald Russell was seen today for cough.  Diagnoses and all orders for this visit:  Cough -     DG Chest 2 View; Future  Sinobronchitis  Other orders -     levofloxacin (LEVAQUIN) 500 MG tablet; Take 1 tablet (500 mg total) by mouth daily.       I have discontinued Ronald Russell potassium chloride SA and ondansetron. I am also having him start on levofloxacin. Additionally, I am having him maintain his fexofenadine, omeprazole, febuxostat, pravastatin, atenolol, acetaminophen, hydrocortisone cream, LORazepam, fluticasone, and benzonatate.  Allergies as of 12/28/2016      Reactions   Augmentin [amoxicillin-pot Clavulanate] Diarrhea   Ciprofloxacin Nausea Only   Codeine Other (See Comments)   Severe headache, nausea and vomiting   Oxycodone Nausea And Vomiting   Vicodin [hydrocodone-acetaminophen] Nausea And Vomiting      Medication List       Accurate as of 12/28/16 11:59 PM. Always use your most recent med list.          acetaminophen 325 MG tablet Commonly known as:  TYLENOL Take 2 tablets (650 mg total) by mouth every 6 (six) hours as needed for mild pain (or Fever >/= 101).   atenolol 50 MG tablet Commonly known as:  TENORMIN Take 1 tablet (50 mg total) by mouth daily.   benzonatate 100 MG capsule Commonly known as:  TESSALON PERLES Take 1 capsule (100 mg total) by mouth 3 (three) times daily as needed for cough.   febuxostat 40 MG tablet Commonly known as:  ULORIC Take 1 tablet (40 mg total) by mouth daily. To prevent gout   fexofenadine 180 MG tablet Commonly known as:  ALLEGRA Take 180 mg by mouth every morning.   fluticasone 50 MCG/ACT nasal spray Commonly known as:  FLONASE Place 2 sprays into both nostrils daily.   hydrocortisone cream 1 % Apply topically 4 (four) times daily as needed for itching  (hemorrhoids).   levofloxacin 500 MG tablet Commonly known as:  LEVAQUIN Take 1 tablet (500 mg total) by mouth daily.   LORazepam 0.5 MG tablet Commonly known as:  ATIVAN   omeprazole 40 MG capsule Commonly known as:  PRILOSEC TAKE 1 CAPSULE (40 MG TOTAL) BY MOUTH 2 (TWO) TIMES DAILY.   pravastatin 40 MG tablet Commonly known as:  PRAVACHOL Take 40 mg by mouth daily.        Follow-up: No Follow-up on file.  Claretta Fraise, M.D.

## 2016-12-29 ENCOUNTER — Ambulatory Visit: Payer: PPO | Admitting: Family Medicine

## 2016-12-31 ENCOUNTER — Ambulatory Visit: Payer: PPO | Admitting: Family Medicine

## 2017-01-05 ENCOUNTER — Ambulatory Visit (INDEPENDENT_AMBULATORY_CARE_PROVIDER_SITE_OTHER): Payer: PPO | Admitting: Family Medicine

## 2017-01-05 ENCOUNTER — Telehealth: Payer: Self-pay | Admitting: Family Medicine

## 2017-01-05 ENCOUNTER — Encounter: Payer: Self-pay | Admitting: Family Medicine

## 2017-01-05 VITALS — BP 118/59 | HR 61 | Temp 97.9°F | Ht 72.0 in | Wt 190.0 lb

## 2017-01-05 DIAGNOSIS — R197 Diarrhea, unspecified: Secondary | ICD-10-CM

## 2017-01-05 DIAGNOSIS — J40 Bronchitis, not specified as acute or chronic: Secondary | ICD-10-CM

## 2017-01-05 DIAGNOSIS — J329 Chronic sinusitis, unspecified: Secondary | ICD-10-CM | POA: Diagnosis not present

## 2017-01-05 NOTE — Progress Notes (Signed)
Subjective:  Patient ID: Ronald Russell, male    DOB: [birth year]  Age: 82 y.o. MRN: [id number]  CC: Follow-up (pt here today following up from cough. He has completed his antibiotics and feels better.)   HPI Ronald Russell presents for Recheck of previous symptoms. His cough has resolved. He is not short of breath. He finished his antibiotic and it seems to have completely cleared his symptoms other than he continues to have intermittent diarrhea. This is described as several loose bowel movements in a row on occasion followed by normal bowel movements for a short time and then the cycle repeats.  Depression screen [facility] 2/9 01/05/2017 12/28/2016 12/24/2016  Decreased Interest 0 0 0  Down, Depressed, Hopeless 0 0 0  PHQ - 2 Score 0 0 0  Altered sleeping - - -  Tired, decreased energy - - -  Change in appetite - - -  Feeling bad or failure about yourself  - - -  Trouble concentrating - - -  Moving slowly or fidgety/restless - - -  Suicidal thoughts - - -  PHQ-9 Score - - -  Difficult doing work/chores - - -  Some recent data might be hidden    History Ronald Russell has a past medical history of Allergy; Anxiety; Bladder cancer ([place]) (2010); Cataract; Chronic bronchitis ([place]); Clotting disorder ([place]); Exertional shortness of breath; GERD (gastroesophageal reflux disease); History of blood transfusion (1949); HOH (hard of hearing); Hypertension; Ocular migraine; Pneumonia; PONV (postoperative nausea and vomiting); Seasonal allergies; Shingles; and Temporal arteritis ([place]).   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   His family history includes Alzheimer's disease in his father; Cancer in his mother.He reports that he has quit  smoking. His smoking use included Cigarettes. He has a 2.25 pack-year smoking history. He has never used smokeless tobacco. He reports that he does not drink alcohol or use drugs.    ROS Review of Systems  Constitutional: Negative for chills, diaphoresis and fever.  HENT: Negative for rhinorrhea and sore throat.   Respiratory: Negative for cough and shortness of breath.   Cardiovascular: Negative for chest pain.  Gastrointestinal: Negative for abdominal pain.  Musculoskeletal: Negative for arthralgias and myalgias.  Skin: Negative for rash.  Neurological: Negative for weakness and headaches.    Objective:  BP (!) 118/59   Pulse 61   Temp 97.9 F (36.6 C) (Oral)   Ht 6' (1.829 m)   Wt 190 lb (86.2 kg)   BMI 25.77 kg/m   BP Readings from Last 3 Encounters:  01/05/17 (!) 118/59  12/28/16 (!) 101/59  12/24/16 113/72    Wt Readings from Last 3 Encounters:  01/05/17 190 lb (86.2 kg)  12/28/16 188 lb (85.3 kg)  12/24/16 187 lb (84.8 kg)     Physical Exam  Constitutional: He is oriented to person, place, and time. He appears well-developed and well-nourished.  HENT:  Head: Normocephalic and atraumatic.  Right Ear: Tympanic membrane and external ear normal. No decreased hearing is noted.  Left Ear: Tympanic membrane and external ear normal. No decreased hearing is noted.  Mouth/Throat: No oropharyngeal exudate or posterior oropharyngeal erythema.  Eyes: Pupils are equal, round, and reactive to light.  Neck: Normal range of motion. Neck supple.  Cardiovascular: Normal rate and regular rhythm.  No murmur heard. Pulmonary/Chest: Breath sounds normal. No respiratory distress.  Abdominal: Soft. Bowel sounds are normal. He exhibits no mass. There is no tenderness.  Neurological: He is alert and oriented to person, place, and time.  Skin: Skin is warm and dry. No erythema.  Psychiatric: He has a normal mood and affect. His behavior is normal.  Vitals  reviewed.     Assessment & Plan:   Ronald Russell was seen today for follow-up.  Diagnoses and all orders for this visit:  Sinobronchitis -     CBC with Differential/Platelet  Diarrhea, unspecified type -     CBC with Differential/Platelet -     CMP14+EGFR   Today I could see no sign of dementia or memory loss based on informal evaluation including discussion of orientation to place and time and person and situation as well as memory short-term events.    I have discontinued [name] benzonatate and levofloxacin. I am also having him maintain his fexofenadine, omeprazole, febuxostat, pravastatin, atenolol, acetaminophen, hydrocortisone cream, LORazepam, and fluticasone.  Allergies as of 01/05/2017      Reactions   Augmentin [amoxicillin-pot Clavulanate] Diarrhea   Ciprofloxacin Nausea Only   Codeine Other (See Comments)   Severe headache, nausea and vomiting   Oxycodone Nausea And Vomiting   Vicodin [hydrocodone-acetaminophen] Nausea And Vomiting      Medication List       Accurate as of 01/05/17 11:59 PM. Always use your most recent med list.          acetaminophen 325 MG tablet Commonly known as:  TYLENOL Take 2 tablets (650 mg total) by mouth every 6 (six) hours as needed for mild pain (or Fever >/= 101).   atenolol 50 MG tablet Commonly known as:  TENORMIN Take 1 tablet (50 mg total) by mouth daily.   febuxostat 40 MG tablet Commonly known as:  ULORIC Take 1 tablet (40 mg total) by mouth daily. To prevent gout   fexofenadine 180 MG tablet Commonly known as:  ALLEGRA Take 180 mg by mouth every morning.   fluticasone 50 MCG/ACT nasal spray Commonly known as:  FLONASE Place 2 sprays into both nostrils daily.   hydrocortisone cream 1 % Apply topically 4 (four) times daily as needed for itching (hemorrhoids).   LORazepam 0.5 MG tablet Commonly known as:  ATIVAN   omeprazole 40 MG capsule Commonly known as:  PRILOSEC TAKE 1 CAPSULE (40 MG TOTAL) BY MOUTH  2 (TWO) TIMES DAILY.   pravastatin 40 MG tablet Commonly known as:  PRAVACHOL Take 40 mg by mouth daily.        Follow-up: Return in about 1 month (around 02/05/2017).  Claretta Fraise, M.D.

## 2017-01-06 LAB — CMP14+EGFR
ALT: 16 IU/L (ref 0–44)
AST: 17 IU/L (ref 0–40)
Albumin/Globulin Ratio: 1.8 (ref 1.2–2.2)
Albumin: 3.8 g/dL (ref 3.5–4.7)
Alkaline Phosphatase: 62 IU/L (ref 39–117)
BUN/Creatinine Ratio: 10 (ref 10–24)
BUN: 12 mg/dL (ref 8–27)
Bilirubin Total: 0.5 mg/dL (ref 0.0–1.2)
CO2: 24 mmol/L (ref 20–29)
Calcium: 8.7 mg/dL (ref 8.6–10.2)
Chloride: 104 mmol/L (ref 96–106)
Creatinine, Ser: 1.23 mg/dL (ref 0.76–1.27)
GFR calc Af Amer: 64 mL/min/{1.73_m2} (ref >59)
GFR calc non Af Amer: 55 mL/min/{1.73_m2} — ABNORMAL LOW (ref >59)
Globulin, Total: 2.1 g/dL (ref 1.5–4.5)
Glucose: 97 mg/dL (ref 65–99)
Potassium: 3.5 mmol/L (ref 3.5–5.2)
Sodium: 141 mmol/L (ref 134–144)
Total Protein: 5.9 g/dL — ABNORMAL LOW (ref 6.0–8.5)

## 2017-01-06 LAB — CBC WITH DIFFERENTIAL/PLATELET
Basophils Absolute: 0.1 10*3/uL (ref 0.0–0.2)
Basos: 1 %
EOS (ABSOLUTE): 0.3 10*3/uL (ref 0.0–0.4)
Eos: 5 %
Hematocrit: 41.8 % (ref 37.5–51.0)
Hemoglobin: 13.6 g/dL (ref 13.0–17.7)
Immature Grans (Abs): 0 10*3/uL (ref 0.0–0.1)
Immature Granulocytes: 0 %
Lymphocytes Absolute: 1.6 10*3/uL (ref 0.7–3.1)
Lymphs: 24 %
MCH: 30.1 pg (ref 26.6–33.0)
MCHC: 32.5 g/dL (ref 31.5–35.7)
MCV: 93 fL (ref 79–97)
Monocytes Absolute: 0.5 10*3/uL (ref 0.1–0.9)
Monocytes: 8 %
Neutrophils Absolute: 4 10*3/uL (ref 1.4–7.0)
Neutrophils: 62 %
Platelets: 100 10*3/uL — CL (ref 150–379)
RBC: 4.52 x10E6/uL (ref 4.14–5.80)
RDW: 16.3 % — ABNORMAL HIGH (ref 12.3–15.4)
WBC: 6.5 10*3/uL (ref 3.4–10.8)

## 2017-01-06 NOTE — Telephone Encounter (Signed)
EC Ronald Russell wanting to verify that Dr. Livia Snellen had told pt that may take him up to 5 months to get better and for him to limit his driving to short distances to grocery store visits and such. She is keeping a log feels that he is developing dementia it does run in their family. Is trying to keep him hydrated.

## 2017-01-07 ENCOUNTER — Ambulatory Visit: Payer: PPO | Admitting: Family Medicine

## 2017-01-07 ENCOUNTER — Encounter (HOSPITAL_COMMUNITY)
Admission: RE | Admit: 2017-01-07 | Discharge: 2017-01-07 | Disposition: A | Discharge status: Home / Self Care | Payer: PPO | Source: Skilled Nursing Facility | Attending: *Deleted | Admitting: *Deleted

## 2017-01-07 DIAGNOSIS — E785 Hyperlipidemia, unspecified: Secondary | ICD-10-CM | POA: Insufficient documentation

## 2017-01-07 DIAGNOSIS — F411 Generalized anxiety disorder: Secondary | ICD-10-CM | POA: Insufficient documentation

## 2017-01-07 DIAGNOSIS — E559 Vitamin D deficiency, unspecified: Secondary | ICD-10-CM | POA: Insufficient documentation

## 2017-01-07 DIAGNOSIS — Z48815 Encounter for surgical aftercare following surgery on the digestive system: Secondary | ICD-10-CM | POA: Insufficient documentation

## 2017-01-07 DIAGNOSIS — E0781 Sick-euthyroid syndrome: Secondary | ICD-10-CM | POA: Insufficient documentation

## 2017-01-10 ENCOUNTER — Telehealth: Payer: Self-pay | Admitting: Family Medicine

## 2017-01-10 NOTE — Telephone Encounter (Signed)
Please release the results to my chart. I can't make them release, see if Erasmo Downer or Tye [place] can help.

## 2017-01-11 NOTE — Telephone Encounter (Signed)
Patient is waiting on results from lab work.  When you have a moment, please review and advise.

## 2017-01-11 NOTE — Telephone Encounter (Signed)
Pt aware and has appt tomorrow.

## 2017-01-11 NOTE — Telephone Encounter (Signed)
Please contact the patient that him know his platelet level is low. This could cause some easy bruising but is usually harmless. Normally it rebounds on its own. However, I recommend he have a repeat in about a week. The rest of his blood tests turned out normal

## 2017-01-12 ENCOUNTER — Encounter: Payer: Self-pay | Admitting: Family Medicine

## 2017-01-12 ENCOUNTER — Ambulatory Visit (INDEPENDENT_AMBULATORY_CARE_PROVIDER_SITE_OTHER): Payer: PPO | Admitting: Family Medicine

## 2017-01-12 VITALS — BP 116/57 | HR 61 | Temp 97.6°F | Ht 72.0 in | Wt 192.0 lb

## 2017-01-12 DIAGNOSIS — J329 Chronic sinusitis, unspecified: Secondary | ICD-10-CM | POA: Diagnosis not present

## 2017-01-12 DIAGNOSIS — J40 Bronchitis, not specified as acute or chronic: Secondary | ICD-10-CM | POA: Diagnosis not present

## 2017-01-12 DIAGNOSIS — R399 Unspecified symptoms and signs involving the genitourinary system: Secondary | ICD-10-CM

## 2017-01-12 LAB — URINALYSIS
Bilirubin, UA: NEGATIVE
Glucose, UA: NEGATIVE
Ketones, UA: NEGATIVE
Leukocytes, UA: NEGATIVE
Nitrite, UA: NEGATIVE
Protein, UA: NEGATIVE
Specific Gravity, UA: 1.025 (ref 1.005–1.030)
Urobilinogen, Ur: 0.2 mg/dL (ref 0.2–1.0)
pH, UA: 5 (ref 5.0–7.5)

## 2017-01-12 MED ORDER — AMOXICILLIN 875 MG PO TABS: 875.0000 mg | ORAL_TABLET | Freq: Two times a day (BID) | ORAL | 0 refills | Status: AC

## 2017-01-12 NOTE — Progress Notes (Signed)
Subjective:  Patient ID: Ronald Russell, male    DOB: Mar 06, 1935  Age: 82 y.o. MRN: [id number]  CC: swimmy headed (pt here today c/o being swimmy headed especially in the mornings but it gets better as the day goes on.)   HPI PAULO KEIMIG presents for This morning the patient awoke with his head pounding and having ringing in both ears. He had broken out into a heavy sweat several times during the night. He continues to cough. He had to get up to go to the bathroom 4 times during the night to urinate. The swimmy headedness in the mornings has been mild until this morning but much worse today. It seems to have been increasing ever since he finished his antibiotic last week.  Depression screen [facility] 2/9 01/12/2017 01/05/2017 12/28/2016  Decreased Interest 0 0 0  Down, Depressed, Hopeless 0 0 0  PHQ - 2 Score 0 0 0  Altered sleeping - - -  Tired, decreased energy - - -  Change in appetite - - -  Feeling bad or failure about yourself  - - -  Trouble concentrating - - -  Moving slowly or fidgety/restless - - -  Suicidal thoughts - - -  PHQ-9 Score - - -  Difficult doing work/chores - - -  Some recent data might be hidden    History Jamario has a past medical history of Allergy; Anxiety; Bladder cancer ([place]) (2010); Cataract; Chronic bronchitis ([place]); Clotting disorder ([place]); Exertional shortness of breath; GERD (gastroesophageal reflux disease); History of blood transfusion (1949); HOH (hard of hearing); Hypertension; Ocular migraine; Pneumonia; PONV (postoperative nausea and vomiting); Seasonal allergies; Shingles; and Temporal arteritis ([place]).   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   His family history includes  Alzheimer's disease in his father; Cancer in his mother.He reports that he has quit smoking. His smoking use included Cigarettes. He has a 2.25 pack-year smoking history. He has never used smokeless tobacco. He reports that he does not drink alcohol or use drugs.    ROS Review of Systems  Constitutional: Positive for chills, diaphoresis and fatigue. Negative for fever.  HENT: Negative for rhinorrhea and sore throat.   Respiratory: Negative for cough and shortness of breath.   Cardiovascular: Negative for chest pain.  Gastrointestinal: Negative for abdominal pain.  Musculoskeletal: Negative for arthralgias and myalgias.  Skin: Negative for rash.  Neurological: Positive for dizziness, weakness and headaches.    Objective:  BP (!) 116/57   Pulse 61   Temp 97.6 F (36.4 C) (Oral)   Ht 6' (1.829 m)   Wt 192 lb (87.1 kg)   BMI 26.04 kg/m   BP Readings from Last 3 Encounters:  01/12/17 (!) 116/57  01/05/17 (!) 118/59  12/28/16 (!) 101/59    Wt Readings from Last 3 Encounters:  01/12/17 192 lb (87.1 kg)  01/05/17 190 lb (86.2 kg)  12/28/16 188 lb (85.3 kg)     Physical Exam  Constitutional: He appears well-developed and well-nourished.  HENT:  Head: Normocephalic and atraumatic.  Right Ear: Tympanic membrane and external ear normal. No decreased hearing is noted.  Left Ear: Tympanic membrane and external ear normal. No decreased hearing is noted.  Nose: Mucosal edema present. Right sinus exhibits no frontal sinus tenderness. Left sinus exhibits no  frontal sinus tenderness.  Mouth/Throat: No oropharyngeal exudate or posterior oropharyngeal erythema.  Neck: No Brudzinski's sign noted.  Pulmonary/Chest: No respiratory distress. He has wheezes (with fair exchange only).  Lymphadenopathy:       Head (right side): No preauricular adenopathy present.       Head (left side): No preauricular adenopathy present.       Right cervical: No superficial cervical adenopathy present.       Left cervical: No superficial cervical adenopathy present.      Assessment & Plan:   [name] was seen today for swimmy headed.  Diagnoses and all orders for this visit:  UTI symptoms -     Urinalysis -     Urine Culture  Sinobronchitis  Other orders -     amoxicillin (AMOXIL) 875 MG tablet; Take 1 tablet (875 mg total) by mouth 2 (two) times daily.       I have discontinued Mr. Skolnick fluticasone. I am also having him start on amoxicillin. Additionally, I am having him maintain his fexofenadine, omeprazole, febuxostat, pravastatin, atenolol, acetaminophen, hydrocortisone cream, and LORazepam.  Allergies as of 01/12/2017      Reactions   Augmentin [amoxicillin-pot Clavulanate] Diarrhea   Ciprofloxacin Nausea Only   Codeine Other (See Comments)   Severe headache, nausea and vomiting   Oxycodone Nausea And Vomiting   Vicodin [hydrocodone-acetaminophen] Nausea And Vomiting      Medication List       Accurate as of 01/12/17  7:24 PM. Always use your most recent med list.          acetaminophen 325 MG tablet Commonly known as:  TYLENOL Take 2 tablets (650 mg total) by mouth every 6 (six) hours as needed for mild pain (or Fever >/= 101).   amoxicillin 875 MG tablet Commonly known as:  AMOXIL Take 1 tablet (875 mg total) by mouth 2 (two) times daily.   atenolol 50 MG tablet Commonly known as:  TENORMIN Take 1 tablet (50 mg total) by mouth daily.   febuxostat 40 MG tablet Commonly known as:  ULORIC Take 1 tablet (40 mg total) by mouth daily. To prevent gout   fexofenadine 180 MG tablet Commonly known as:  ALLEGRA Take 180 mg by mouth every morning.   hydrocortisone cream 1 % Apply topically 4 (four) times daily as needed for itching (hemorrhoids).   LORazepam 0.5 MG tablet Commonly known as:  ATIVAN   omeprazole 40 MG capsule Commonly known as:  PRILOSEC TAKE 1 CAPSULE (40 MG TOTAL) BY MOUTH 2 (TWO) TIMES DAILY.   pravastatin 40 MG tablet Commonly known  as:  PRAVACHOL Take 40 mg by mouth daily.        Follow-up: No Follow-up on file.  Claretta Fraise, M.D.

## 2017-01-14 ENCOUNTER — Telehealth: Payer: Self-pay | Admitting: *Deleted

## 2017-01-14 ENCOUNTER — Other Ambulatory Visit: Payer: Self-pay | Admitting: *Deleted

## 2017-01-14 DIAGNOSIS — D696 Thrombocytopenia, unspecified: Secondary | ICD-10-CM

## 2017-01-14 LAB — URINE CULTURE

## 2017-01-14 NOTE — Telephone Encounter (Signed)
Ronald Russell aware that patient needs to come back in to have platelets rechecked

## 2017-01-14 NOTE — Telephone Encounter (Signed)
Ronald Russell [facility]) called stating that patient has not received lab results.

## 2017-01-14 NOTE — Telephone Encounter (Signed)
Low platelets, will rtc for repeat CBC

## 2017-01-14 NOTE — Telephone Encounter (Signed)
Patient states that platelets have been low for 40 years and he is not worried about it.  Patient would like to not come in and have blood redrawn

## 2017-01-18 ENCOUNTER — Ambulatory Visit: Payer: PPO | Admitting: Family Medicine

## 2017-01-24 ENCOUNTER — Other Ambulatory Visit: Payer: Self-pay | Admitting: Family Medicine

## 2017-01-30 ENCOUNTER — Other Ambulatory Visit: Payer: Self-pay | Admitting: Pediatrics

## 2017-02-07 ENCOUNTER — Ambulatory Visit (INDEPENDENT_AMBULATORY_CARE_PROVIDER_SITE_OTHER): Payer: PPO | Admitting: Family Medicine

## 2017-02-07 ENCOUNTER — Encounter: Payer: Self-pay | Admitting: Family Medicine

## 2017-02-07 ENCOUNTER — Other Ambulatory Visit: Payer: Self-pay | Admitting: Family Medicine

## 2017-02-07 VITALS — BP 113/60 | HR 66 | Temp 97.4°F | Ht 72.0 in | Wt 193.0 lb

## 2017-02-07 DIAGNOSIS — R202 Paresthesia of skin: Secondary | ICD-10-CM

## 2017-02-07 DIAGNOSIS — F411 Generalized anxiety disorder: Secondary | ICD-10-CM | POA: Diagnosis not present

## 2017-02-07 NOTE — Progress Notes (Signed)
Subjective:  Patient ID: Ronald Russell, male    DOB: [birth year]  Age: 82 y.o. MRN: [id number]  CC: Follow-up (pt here today for 1 month follow up and he is c/o burning on his feet, eyelids and on his feet.)   HPI Ronald Russell presents for Burning in his feet burning at the crease at the back of his upper eyelids. He is using eyedrops and they help but this burning is not in his eyes. He continues to have trouble sleeping at night. He uses half a lorazepam. If he takes more he feels drowsy the next morning like he's been dropped. Additionally he states that he recently stopped his pravastatin and lower because he was having elevated blood pressures periodically. He felt fuzzy headed particularly on the left side and his heart would pound. This went away when he quit taking the medicines. He resumed the use lower and it came back. He is still not taking the pravastatin. Currently he is taking his omeprazole, fexofenadine, lorazepam, and atenolol. He recently took Augmentin and had diarrhea after 4 days that was quite profuse therefore he discontinued that medicine but he says the infection cleared anyway.   Depression screen [facility] 2/9 02/07/2017 01/12/2017 01/05/2017  Decreased Interest 0 0 0  Down, Depressed, Hopeless 0 0 0  PHQ - 2 Score 0 0 0  Altered sleeping - - -  Tired, decreased energy - - -  Change in appetite - - -  Feeling bad or failure about yourself  - - -  Trouble concentrating - - -  Moving slowly or fidgety/restless - - -  Suicidal thoughts - - -  PHQ-9 Score - - -  Difficult doing work/chores - - -  Some recent data might be hidden    History Ronald Russell has a past medical history of Allergy; Anxiety; Bladder cancer ([place]) (2010); Cataract; Chronic bronchitis ([place]); Clotting disorder ([place]); Exertional shortness of breath; GERD (gastroesophageal reflux disease); History of blood transfusion (1949); HOH (hard of hearing); Hypertension; Ocular migraine; Pneumonia; PONV (postoperative  nausea and vomiting); Seasonal allergies; Shingles; and Temporal arteritis ([place]).   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   His family history includes Alzheimer's disease in his father; Cancer in his mother.He reports that he has quit smoking. His smoking use included Cigarettes. He has a 2.25 pack-year smoking history. He has never used smokeless tobacco. He reports that he does not drink alcohol or use drugs.    ROS Review of Systems  Constitutional: Negative for chills, diaphoresis and fever.  HENT: Negative for rhinorrhea and sore throat.   Eyes: Positive for pain.  Respiratory: Negative for cough and shortness of breath.   Cardiovascular: Negative for chest pain.  Gastrointestinal: Negative for abdominal pain.  Musculoskeletal: Negative for arthralgias and myalgias.  Skin: Negative for rash.  Neurological: Negative for weakness and headaches.    Objective:  BP 113/60   Pulse 66   Temp (!) 97.4 F (36.3 C) (Oral)   Ht 6' (1.829 m)   Wt 193 lb (87.5 kg)   BMI 26.18 kg/m   BP Readings from Last 3 Encounters:  02/07/17 113/60  01/12/17 (!) 116/57  01/05/17 (!) 118/59    Wt Readings from Last 3 Encounters:  02/07/17 193 lb (87.5 kg)  01/12/17 192  lb (87.1 kg)  01/05/17 190 lb (86.2 kg)     Physical Exam  Constitutional: He is oriented to person, place, and time. He appears well-developed and well-nourished. No distress.  HENT:  Head: Normocephalic and atraumatic.  Right Ear: External ear normal.  Left Ear: External ear normal.  Nose: Nose normal.  Mouth/Throat: Oropharynx is clear and moist.  Eyes: Pupils are equal, round, and reactive to light. Conjunctivae and EOM are normal.  Neck:  Normal range of motion. Neck supple. No thyromegaly present.  Cardiovascular: Normal rate, regular rhythm and normal heart sounds.   No murmur heard. Pulmonary/Chest: Effort normal and breath sounds normal. No respiratory distress. He has no wheezes. He has no rales.  Abdominal: Soft. Bowel sounds are normal. He exhibits no distension. There is no tenderness.  Lymphadenopathy:    He has no cervical adenopathy.  Neurological: He is alert and oriented to person, place, and time. He has normal reflexes.  Skin: Skin is warm and dry.  Psychiatric: He has a normal mood and affect. His behavior is normal. Judgment and thought content normal.      Assessment & Plan:   Ronald Russell was seen today for follow-up.  Diagnoses and all orders for this visit:  Paresthesia -     CBC with Differential/Platelet -     CMP14+EGFR -     Vitamin B12  GAD (generalized anxiety disorder)    I believe the patient is suffering from a great deal of anxiety. He may also be developing some early dementia. He is somewhat repetitive. Of note is the, of Augmentin was presented as if it was a new concern even though this occurred 6 months ago and he's been seen here and it's been discussed multiple times in the past. Additionally his levofloxacin was discussed at his last office visit although he presented it today as if it was new. The lower reaction appears real. I do not think the pravastatin was responsible for any of his symptoms.   I have discontinued Ronald Russell febuxostat. I am also having him maintain his fexofenadine, atenolol, acetaminophen, hydrocortisone cream, LORazepam, omeprazole, and pravastatin.  Allergies as of 02/07/2017      Reactions   Augmentin [amoxicillin-pot Clavulanate] Diarrhea   Ciprofloxacin Nausea Only   Codeine Other (See Comments)   Severe headache, nausea and vomiting   Oxycodone Nausea And Vomiting   Uloric [febuxostat] Other (See Comments)   Hypertension with palpitations and a  sense of fuzziness and dizziness at the left side of his head   Vicodin [hydrocodone-acetaminophen] Nausea And Vomiting      Medication List       Accurate as of 02/07/17  6:32 PM. Always use your most recent med list.          acetaminophen 325 MG tablet Commonly known as:  TYLENOL Take 2 tablets (650 mg total) by mouth every 6 (six) hours as needed for mild pain (or Fever >/= 101).   atenolol 50 MG tablet Commonly known as:  TENORMIN Take 1 tablet (50 mg total) by mouth daily.   fexofenadine 180 MG tablet Commonly known as:  ALLEGRA Take 180 mg by mouth every morning.   hydrocortisone cream 1 % Apply topically 4 (four) times daily as needed for itching (hemorrhoids).   LORazepam 0.5 MG tablet Commonly known as:  ATIVAN   omeprazole 40 MG capsule Commonly known as:  PRILOSEC TAKE ONE CAPSULE BY MOUTH TWICE A DAY   pravastatin 40 MG tablet Commonly known as:  PRAVACHOL TAKE 1 TABLET (40 MG TOTAL) BY MOUTH DAILY.            Discharge Care Instructions        Start     Ordered   02/07/17 0000  CBC with Differential/Platelet     02/07/17 1637   02/07/17 0000  CMP14+EGFR     02/07/17 1637   02/07/17 0000  Vitamin B12     02/07/17 1637       Follow-up: Return in about 3 months (around 05/10/2017).  Claretta Fraise, M.D.

## 2017-02-07 NOTE — Patient Instructions (Signed)
Increase Lorazepam to 1 pill at bedtime. Stop Uloric. Resume Pravastatin.

## 2017-02-08 LAB — CMP14+EGFR
ALT: 19 IU/L (ref 0–44)
AST: 20 IU/L (ref 0–40)
Albumin/Globulin Ratio: 1.9 (ref 1.2–2.2)
Albumin: 4.3 g/dL (ref 3.5–4.7)
Alkaline Phosphatase: 62 IU/L (ref 39–117)
BUN/Creatinine Ratio: 13 (ref 10–24)
BUN: 17 mg/dL (ref 8–27)
Bilirubin Total: 0.5 mg/dL (ref 0.0–1.2)
CO2: 23 mmol/L (ref 20–29)
Calcium: 9.3 mg/dL (ref 8.6–10.2)
Chloride: 105 mmol/L (ref 96–106)
Creatinine, Ser: 1.33 mg/dL — ABNORMAL HIGH (ref 0.76–1.27)
GFR calc Af Amer: 58 mL/min/{1.73_m2} — ABNORMAL LOW (ref >59)
GFR calc non Af Amer: 50 mL/min/{1.73_m2} — ABNORMAL LOW (ref >59)
Globulin, Total: 2.3 g/dL (ref 1.5–4.5)
Glucose: 115 mg/dL — ABNORMAL HIGH (ref 65–99)
Potassium: 4 mmol/L (ref 3.5–5.2)
Sodium: 142 mmol/L (ref 134–144)
Total Protein: 6.6 g/dL (ref 6.0–8.5)

## 2017-02-08 LAB — CBC WITH DIFFERENTIAL/PLATELET
Basophils Absolute: 0 10*3/uL (ref 0.0–0.2)
Basos: 1 %
EOS (ABSOLUTE): 0.1 10*3/uL (ref 0.0–0.4)
Eos: 2 %
Hematocrit: 45 % (ref 37.5–51.0)
Hemoglobin: 14.7 g/dL (ref 13.0–17.7)
Immature Grans (Abs): 0 10*3/uL (ref 0.0–0.1)
Immature Granulocytes: 0 %
Lymphocytes Absolute: 1.6 10*3/uL (ref 0.7–3.1)
Lymphs: 24 %
MCH: 30.4 pg (ref 26.6–33.0)
MCHC: 32.7 g/dL (ref 31.5–35.7)
MCV: 93 fL (ref 79–97)
Monocytes Absolute: 0.6 10*3/uL (ref 0.1–0.9)
Monocytes: 8 %
Neutrophils Absolute: 4.3 10*3/uL (ref 1.4–7.0)
Neutrophils: 65 %
Platelets: 159 10*3/uL (ref 150–379)
RBC: 4.83 x10E6/uL (ref 4.14–5.80)
RDW: 16 % — ABNORMAL HIGH (ref 12.3–15.4)
WBC: 6.6 10*3/uL (ref 3.4–10.8)

## 2017-02-08 LAB — VITAMIN B12: Vitamin B-12: 330 pg/mL (ref 232–1245)

## 2017-02-23 DIAGNOSIS — Z8551 Personal history of malignant neoplasm of bladder: Secondary | ICD-10-CM | POA: Diagnosis not present

## 2017-03-11 ENCOUNTER — Encounter: Payer: Self-pay | Admitting: Family Medicine

## 2017-03-11 ENCOUNTER — Ambulatory Visit (INDEPENDENT_AMBULATORY_CARE_PROVIDER_SITE_OTHER): Payer: PPO | Admitting: Family Medicine

## 2017-03-11 VITALS — BP 126/72 | HR 59 | Temp 96.7°F | Ht 72.0 in | Wt 199.0 lb

## 2017-03-11 DIAGNOSIS — I10 Essential (primary) hypertension: Secondary | ICD-10-CM

## 2017-03-11 DIAGNOSIS — Z23 Encounter for immunization: Secondary | ICD-10-CM | POA: Diagnosis not present

## 2017-03-11 DIAGNOSIS — E559 Vitamin D deficiency, unspecified: Secondary | ICD-10-CM

## 2017-03-11 NOTE — Progress Notes (Signed)
Subjective:  Patient ID: Ronald Russell, male    DOB: [birth year]  Age: 82 y.o. MRN: [id number]  CC: 1 month follow up   HPI LARK RUNK presents for Burning sensation persists at his eyes & feet, but manageable.Doesn't feel like gout.  Doesn't feel anxious now. Says that ended when he split up with his wife. Has a new girlfriend. Thinking of moving to [place] to be with her.   Blood pressure follow up. Meds agree with him. Denies side effects. No heart related sx including chest pain, dyspnea. Due for labwork for renal function  Depression screen [facility] 2/9 03/11/2017 02/07/2017 01/12/2017  Decreased Interest 0 0 0  Down, Depressed, Hopeless 0 0 0  PHQ - 2 Score 0 0 0  Altered sleeping - - -  Tired, decreased energy - - -  Change in appetite - - -  Feeling bad or failure about yourself  - - -  Trouble concentrating - - -  Moving slowly or fidgety/restless - - -  Suicidal thoughts - - -  PHQ-9 Score - - -  Difficult doing work/chores - - -  Some recent data might be hidden    History Jawon has a past medical history of Allergy; Anxiety; Bladder cancer ([place]) (2010); Cataract; Chronic bronchitis ([place]); Clotting disorder ([place]); Exertional shortness of breath; GERD (gastroesophageal reflux disease); History of blood transfusion (1949); HOH (hard of hearing); Hypertension; Ocular migraine; Pneumonia; PONV (postoperative nausea and vomiting); Seasonal allergies; Shingles; and Temporal arteritis ([place]).   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   His family history includes Alzheimer's disease in his father; Cancer in his mother.He reports that he has quit smoking. His smoking use included Cigarettes. He has a 2.25  pack-year smoking history. He has never used smokeless tobacco. He reports that he does not drink alcohol or use drugs.    ROS Review of Systems  Constitutional: Negative for chills, diaphoresis, fever and unexpected weight change.  HENT: Negative for congestion, hearing loss, rhinorrhea and sore throat.   Eyes: Negative for visual disturbance.  Respiratory: Negative for cough and shortness of breath.   Cardiovascular: Negative for chest pain.  Gastrointestinal: Negative for abdominal pain, constipation and diarrhea.  Genitourinary: Negative for dysuria and flank pain.  Musculoskeletal: Negative for arthralgias and joint swelling.  Skin: Negative for rash.  Neurological: Negative for dizziness and headaches.  Psychiatric/Behavioral: Negative for dysphoric mood and sleep disturbance.    Objective:  BP 126/72   Pulse (!) 59   Temp (!) 96.7 F (35.9 C) (Oral)   Ht 6' (1.829 m)   Wt 199 lb (90.3 kg)   BMI 26.99 kg/m   BP Readings from Last 3 Encounters:  03/11/17 126/72  02/07/17 113/60  01/12/17 (!) 116/57    Wt Readings from Last 3 Encounters:  03/11/17 199 lb (90.3 kg)  02/07/17 193 lb (87.5 kg)  01/12/17 192 lb (87.1 kg)     Physical Exam  Constitutional: He is oriented to person, place, and time. He appears well-developed and well-nourished. No distress.  HENT:  Head: Normocephalic and atraumatic.  Right Ear: External ear normal.  Left Ear: External ear normal.  Nose: Nose normal.  Mouth/Throat: Oropharynx is clear and moist.  Eyes: Pupils are equal, round, and reactive to light. Conjunctivae  and EOM are normal.  Neck: Normal range of motion. Neck supple. No thyromegaly present.  Cardiovascular: Normal rate, regular rhythm and normal heart sounds.   No murmur heard. Pulmonary/Chest: Effort normal and breath sounds normal. No respiratory distress. He has no wheezes. He has no rales.  Abdominal: Soft. Bowel sounds are normal. He exhibits no distension. There is  no tenderness.  Lymphadenopathy:    He has no cervical adenopathy.  Neurological: He is alert and oriented to person, place, and time. He has normal reflexes.  Skin: Skin is warm and dry.  Psychiatric: He has a normal mood and affect. His behavior is normal. Judgment and thought content normal.      Assessment & Plan:   Kiah was seen today for 1 month follow up.  Diagnoses and all orders for this visit:  Vitamin D deficiency -     VITAMIN D 25 Hydroxy (Vit-D Deficiency, Fractures)  Need for immunization against influenza -     Flu Vaccine QUAD 36+ mos IM  Essential hypertension -     CMP14+EGFR -     Lipid panel       I have discontinued Mr. Lattie Cervi. I am also having him maintain his fexofenadine, atenolol, acetaminophen, hydrocortisone cream, LORazepam, omeprazole, and pravastatin.  Allergies as of 03/11/2017      Reactions   Augmentin [amoxicillin-pot Clavulanate] Diarrhea   Ciprofloxacin Nausea Only   Codeine Other (See Comments)   Severe headache, nausea and vomiting   Oxycodone Nausea And Vomiting   Uloric [febuxostat] Other (See Comments)   Hypertension with palpitations and a sense of fuzziness and dizziness at the left side of his head   Vicodin [hydrocodone-acetaminophen] Nausea And Vomiting      Medication List       Accurate as of 03/11/17 11:59 PM. Always use your most recent med list.          acetaminophen 325 MG tablet Commonly known as:  TYLENOL Take 2 tablets (650 mg total) by mouth every 6 (six) hours as needed for mild pain (or Fever >/= 101).   atenolol 50 MG tablet Commonly known as:  TENORMIN Take 1 tablet (50 mg total) by mouth daily.   fexofenadine 180 MG tablet Commonly known as:  ALLEGRA Take 180 mg by mouth every morning.   hydrocortisone cream 1 % Apply topically 4 (four) times daily as needed for itching (hemorrhoids).   LORazepam 0.5 MG tablet Commonly known as:  ATIVAN   omeprazole 40 MG capsule Commonly  known as:  PRILOSEC TAKE ONE CAPSULE BY MOUTH TWICE A DAY   pravastatin 40 MG tablet Commonly known as:  PRAVACHOL TAKE 1 TABLET (40 MG TOTAL) BY MOUTH DAILY.            Discharge Care Instructions        Start     Ordered   03/11/17 0000  Flu Vaccine QUAD 36+ mos IM     03/11/17 1603   03/11/17 0000  CMP14+EGFR     03/11/17 1605   03/11/17 0000  Lipid panel     03/11/17 1605   03/11/17 0000  VITAMIN D 25 Hydroxy (Vit-D Deficiency, Fractures)     03/11/17 1605       Follow-up: Return in about 3 months (around 06/10/2017).  Claretta Fraise, M.D.

## 2017-03-12 LAB — CMP14+EGFR
ALT: 14 IU/L (ref 0–44)
AST: 19 IU/L (ref 0–40)
Albumin/Globulin Ratio: 1.8 (ref 1.2–2.2)
Albumin: 4.1 g/dL (ref 3.5–4.7)
Alkaline Phosphatase: 56 IU/L (ref 39–117)
BUN/Creatinine Ratio: 9 — ABNORMAL LOW (ref 10–24)
BUN: 13 mg/dL (ref 8–27)
Bilirubin Total: 0.5 mg/dL (ref 0.0–1.2)
CO2: 23 mmol/L (ref 20–29)
Calcium: 8.9 mg/dL (ref 8.6–10.2)
Chloride: 107 mmol/L — ABNORMAL HIGH (ref 96–106)
Creatinine, Ser: 1.46 mg/dL — ABNORMAL HIGH (ref 0.76–1.27)
GFR calc Af Amer: 52 mL/min/{1.73_m2} — ABNORMAL LOW (ref >59)
GFR calc non Af Amer: 45 mL/min/{1.73_m2} — ABNORMAL LOW (ref >59)
Globulin, Total: 2.3 g/dL (ref 1.5–4.5)
Glucose: 90 mg/dL (ref 65–99)
Potassium: 4.3 mmol/L (ref 3.5–5.2)
Sodium: 147 mmol/L — ABNORMAL HIGH (ref 134–144)
Total Protein: 6.4 g/dL (ref 6.0–8.5)

## 2017-03-12 LAB — LIPID PANEL
Chol/HDL Ratio: 3.9 ratio (ref 0.0–5.0)
Cholesterol, Total: 131 mg/dL (ref 100–199)
HDL: 34 mg/dL — ABNORMAL LOW (ref >39)
LDL Calculated: 56 mg/dL (ref 0–99)
Triglycerides: 204 mg/dL — ABNORMAL HIGH (ref 0–149)
VLDL Cholesterol Cal: 41 mg/dL — ABNORMAL HIGH (ref 5–40)

## 2017-03-12 LAB — VITAMIN D 25 HYDROXY (VIT D DEFICIENCY, FRACTURES): Vit D, 25-Hydroxy: 26.5 ng/mL — ABNORMAL LOW (ref 30.0–100.0)

## 2017-03-22 ENCOUNTER — Ambulatory Visit (INDEPENDENT_AMBULATORY_CARE_PROVIDER_SITE_OTHER): Payer: PPO | Admitting: Family Medicine

## 2017-03-22 ENCOUNTER — Encounter: Payer: Self-pay | Admitting: Family Medicine

## 2017-03-22 VITALS — BP 130/80 | HR 62 | Temp 97.1°F | Ht 73.0 in | Wt 195.0 lb

## 2017-03-22 DIAGNOSIS — J0100 Acute maxillary sinusitis, unspecified: Secondary | ICD-10-CM | POA: Diagnosis not present

## 2017-03-22 MED ORDER — AMOXICILLIN 500 MG PO CAPS: 500.0000 mg | ORAL_CAPSULE | Freq: Three times a day (TID) | ORAL | 0 refills | Status: DC

## 2017-03-22 NOTE — Patient Instructions (Signed)
Great to see you!  Please let us know if you have continued symptoms, we could arrange for a physical therapist to teach you epleys maneuvers.

## 2017-03-22 NOTE — Progress Notes (Signed)
   HPI  Patient presents today here with dizziness.  Patient's when she's had dizziness about one week off and on. He describes it as "slimy headedness". He describes room spinning sensation with lying his head down on the right or the left. He states that it was worse this morning and has improved somewhat today.  He does have some left-sided facial pain.  He is getting married later this week.  He has also had some upset stomach and loose stools this week, overall he feels well. He states that his ENT has given him amoxicillin for similar symptoms with good success multiple times  PMH: Smoking status noted ROS: Per HPI  Objective: BP 130/80   Pulse 62   Temp (!) 97.1 F (36.2 C) (Oral)   Ht 6\' 1"  (1.854 m)   Wt 195 lb (88.5 kg)   BMI 25.73 kg/m  Gen: NAD, alert, cooperative with exam HEENT: NCAT, right TM not visible due to ear canal abnormality, left TM with erythema in the canal but normal TM, left-sided tenderness to palpation over the maxillary sinus CV: RRR, good S1/S2, no murmur Resp: CTABL, no wheezes, non-labored Abd: SNTND, BS present, no guarding or organomegaly Ext: No edema, warm Neuro: Alert and oriented, No gross deficits  Assessment and plan:  # Acute sinusitis Patient also has symptoms of benign positional vertigo, It could be that he has vertigo secondary to inflammation of the left ear, we discussed Epley's maneuvers, he would like to be treated for sinusitis first. He cannot tolerate Augmentin, he states that he has done well with amoxicillin in the past. Though this is not typical first line treatment for sinusitis I believe that will work for him given his history and previous good responses       Meds ordered this encounter  Medications  . amoxicillin (AMOXIL) 500 MG capsule    Sig: Take 1 capsule (500 mg total) by mouth 3 (three) times daily.    Dispense:  30 capsule    Refill:  [place], MD [facility] 03/22/2017, 2:24 PM

## 2017-04-11 DIAGNOSIS — J3089 Other allergic rhinitis: Secondary | ICD-10-CM | POA: Diagnosis not present

## 2017-04-11 DIAGNOSIS — J019 Acute sinusitis, unspecified: Secondary | ICD-10-CM | POA: Diagnosis not present

## 2017-04-11 DIAGNOSIS — H6522 Chronic serous otitis media, left ear: Secondary | ICD-10-CM | POA: Diagnosis not present

## 2017-04-13 DIAGNOSIS — K219 Gastro-esophageal reflux disease without esophagitis: Secondary | ICD-10-CM | POA: Diagnosis present

## 2017-04-24 ENCOUNTER — Other Ambulatory Visit: Payer: Self-pay | Admitting: Family Medicine

## 2017-05-08 ENCOUNTER — Other Ambulatory Visit: Payer: Self-pay | Admitting: Pediatrics

## 2017-05-20 DIAGNOSIS — J3089 Other allergic rhinitis: Secondary | ICD-10-CM | POA: Diagnosis not present

## 2017-05-20 DIAGNOSIS — Z9889 Other specified postprocedural states: Secondary | ICD-10-CM | POA: Diagnosis not present

## 2017-05-20 DIAGNOSIS — K219 Gastro-esophageal reflux disease without esophagitis: Secondary | ICD-10-CM | POA: Diagnosis not present

## 2017-05-20 DIAGNOSIS — Z9049 Acquired absence of other specified parts of digestive tract: Secondary | ICD-10-CM | POA: Diagnosis not present

## 2017-05-20 DIAGNOSIS — Z8551 Personal history of malignant neoplasm of bladder: Secondary | ICD-10-CM | POA: Diagnosis not present

## 2017-05-20 DIAGNOSIS — J0100 Acute maxillary sinusitis, unspecified: Secondary | ICD-10-CM | POA: Diagnosis not present

## 2017-05-20 DIAGNOSIS — I10 Essential (primary) hypertension: Secondary | ICD-10-CM | POA: Diagnosis not present

## 2017-06-17 DIAGNOSIS — J329 Chronic sinusitis, unspecified: Secondary | ICD-10-CM | POA: Diagnosis not present

## 2017-06-27 ENCOUNTER — Other Ambulatory Visit: Payer: Self-pay | Admitting: Pediatrics

## 2017-06-27 ENCOUNTER — Ambulatory Visit (INDEPENDENT_AMBULATORY_CARE_PROVIDER_SITE_OTHER): Payer: PPO | Admitting: Family Medicine

## 2017-06-27 ENCOUNTER — Encounter: Payer: Self-pay | Admitting: Family Medicine

## 2017-06-27 VITALS — BP 140/73 | HR 72 | Temp 97.3°F | Ht 73.0 in | Wt 203.0 lb

## 2017-06-27 DIAGNOSIS — R55 Syncope and collapse: Secondary | ICD-10-CM | POA: Diagnosis not present

## 2017-06-27 DIAGNOSIS — R5383 Other fatigue: Secondary | ICD-10-CM

## 2017-06-27 DIAGNOSIS — I498 Other specified cardiac arrhythmias: Secondary | ICD-10-CM

## 2017-06-27 NOTE — Progress Notes (Signed)
BP 140/73   Pulse 72   Temp (!) 97.3 F (36.3 C) (Oral)   Ht '6\' 1"'$  (1.854 m)   Wt 203 lb (92.1 kg)   BMI 26.78 kg/m    Subjective:    Patient ID: Ronald Russell, male    DOB: 10/01/1934, 83 y.o.   MRN: [id number]  HPI: Ronald Russell is a 84 y.o. male presenting on 06/27/2017 for Heart fluttering this morning, dizziness; (hasn't felt good all day, episode of feeling like he was going to pass out earlier, felt pulse and it was irregular, blood pressure has been higher recently; episode like this one year ago, wore a heart monitor)   HPI Palpitations and dizziness and near syncope Patient is coming in with complaints of palpitations and dizziness and near syncope that happened in an episode earlier this morning and has generally just been feeling not well and fatigued the rest of the day.  He says when the incident happened early this morning he felt his heart rate and felt like it was irregular and fast.  He does admit that his blood pressure has been higher recently when he checked it at that time it was higher as well.  About 1 year ago he had an episode similar to this and he wore a heart monitor and saw cardiologist and was started on atenolol which he still takes.  He had not had any episodes similar to this since he was started on atenolol.  He denies any chest pain or shortness of breath associated with this.  He felt very lightheaded but did not completely pass out was able to sit down and let the moment pass.  Relevant past medical, surgical, family and social history reviewed and updated as indicated. Interim medical history since our last visit reviewed. Allergies and medications reviewed and updated.  Review of Systems  Constitutional: Positive for fatigue. Negative for chills and fever.  Respiratory: Negative for shortness of breath and wheezing.   Cardiovascular: Negative for chest pain and leg swelling.  Musculoskeletal: Negative for back pain and gait problem.  Skin:  Negative for rash.  Neurological: Positive for dizziness and light-headedness. Negative for weakness, numbness and headaches.  All other systems reviewed and are negative.  Per HPI unless specifically indicated above     Objective:    BP 140/73   Pulse 72   Temp (!) 97.3 F (36.3 C) (Oral)   Ht '6\' 1"'$  (1.854 m)   Wt 203 lb (92.1 kg)   BMI 26.78 kg/m   Wt Readings from Last 3 Encounters:  06/27/17 203 lb (92.1 kg)  03/22/17 195 lb (88.5 kg)  03/11/17 199 lb (90.3 kg)    Physical Exam  Constitutional: He is oriented to person, place, and time. He appears well-developed and well-nourished. No distress.  Eyes: Conjunctivae are normal. No scleral icterus.  Neck: Neck supple. No thyromegaly present.  Cardiovascular: Normal rate, regular rhythm, normal heart sounds and intact distal pulses.  No murmur heard. Pulmonary/Chest: Effort normal and breath sounds normal. No respiratory distress. He has no wheezes. He has no rales.  Musculoskeletal: Normal range of motion. He exhibits no edema.  Lymphadenopathy:    He has no cervical adenopathy.  Neurological: He is alert and oriented to person, place, and time. Coordination normal.  Skin: Skin is warm and dry. No rash noted. He is not diaphoretic.  Psychiatric: He has a normal mood and affect. His behavior is normal.  Nursing note and vitals reviewed.  EKG: Right bundle blanch block, ST depression in V2, otherwise unchanged from previous EKG with a rate of 69 and regular rhythm.    Assessment & Plan:   Problem List Items Addressed This Visit    None    Visit Diagnoses    Fluttering heart    -  Primary   Relevant Orders   EKG 12-Lead (Completed)   CBC with Differential/Platelet   BMP8+EGFR   TSH   VITAMIN D 25 Hydroxy (Vit-D Deficiency, Fractures)   Near syncope       Relevant Orders   CBC with Differential/Platelet   BMP8+EGFR   TSH   VITAMIN D 25 Hydroxy (Vit-D Deficiency, Fractures)   Other fatigue       Relevant  Orders   CBC with Differential/Platelet   BMP8+EGFR   TSH   VITAMIN D 25 Hydroxy (Vit-D Deficiency, Fractures)    Patient was instructed to return to cardiology, will do referral and get him back with them.  Follow up plan: Return if symptoms worsen or fail to improve.  Counseling provided for all of the vaccine components Orders Placed This Encounter  Procedures  . CBC with Differential/Platelet  . BMP8+EGFR  . TSH  . VITAMIN D 25 Hydroxy (Vit-D Deficiency, Fractures)  . EKG 12-Lead    Caryl Pina, MD [facility] 06/27/2017, 4:20 PM

## 2017-06-28 LAB — CBC WITH DIFFERENTIAL/PLATELET
Basophils Absolute: 0.1 10*3/uL (ref 0.0–0.2)
Basos: 1 %
EOS (ABSOLUTE): 0.2 10*3/uL (ref 0.0–0.4)
Eos: 2 %
Hematocrit: 43.3 % (ref 37.5–51.0)
Hemoglobin: 14.5 g/dL (ref 13.0–17.7)
Immature Grans (Abs): 0 10*3/uL (ref 0.0–0.1)
Immature Granulocytes: 0 %
Lymphocytes Absolute: 1.6 10*3/uL (ref 0.7–3.1)
Lymphs: 22 %
MCH: 30.4 pg (ref 26.6–33.0)
MCHC: 33.5 g/dL (ref 31.5–35.7)
MCV: 91 fL (ref 79–97)
Monocytes Absolute: 0.5 10*3/uL (ref 0.1–0.9)
Monocytes: 7 %
Neutrophils Absolute: 4.8 10*3/uL (ref 1.4–7.0)
Neutrophils: 68 %
Platelets: 153 10*3/uL (ref 150–379)
RBC: 4.77 x10E6/uL (ref 4.14–5.80)
RDW: 14.4 % (ref 12.3–15.4)
WBC: 7.1 10*3/uL (ref 3.4–10.8)

## 2017-06-28 LAB — BMP8+EGFR
BUN/Creatinine Ratio: 11 (ref 10–24)
BUN: 15 mg/dL (ref 8–27)
CO2: 23 mmol/L (ref 20–29)
Calcium: 9.2 mg/dL (ref 8.6–10.2)
Chloride: 103 mmol/L (ref 96–106)
Creatinine, Ser: 1.33 mg/dL — ABNORMAL HIGH (ref 0.76–1.27)
GFR calc Af Amer: 58 mL/min/{1.73_m2} — ABNORMAL LOW (ref >59)
GFR calc non Af Amer: 50 mL/min/{1.73_m2} — ABNORMAL LOW (ref >59)
Glucose: 91 mg/dL (ref 65–99)
Potassium: 4 mmol/L (ref 3.5–5.2)
Sodium: 139 mmol/L (ref 134–144)

## 2017-06-28 LAB — VITAMIN D 25 HYDROXY (VIT D DEFICIENCY, FRACTURES): Vit D, 25-Hydroxy: 26.5 ng/mL — ABNORMAL LOW (ref 30.0–100.0)

## 2017-06-28 LAB — TSH: TSH: 3.15 u[IU]/mL (ref 0.450–4.500)

## 2017-07-07 DIAGNOSIS — M9905 Segmental and somatic dysfunction of pelvic region: Secondary | ICD-10-CM | POA: Diagnosis not present

## 2017-07-07 DIAGNOSIS — M50122 Cervical disc disorder at C5-C6 level with radiculopathy: Secondary | ICD-10-CM | POA: Diagnosis not present

## 2017-07-07 DIAGNOSIS — M9903 Segmental and somatic dysfunction of lumbar region: Secondary | ICD-10-CM | POA: Diagnosis not present

## 2017-07-07 DIAGNOSIS — M9901 Segmental and somatic dysfunction of cervical region: Secondary | ICD-10-CM | POA: Diagnosis not present

## 2017-07-07 DIAGNOSIS — M5136 Other intervertebral disc degeneration, lumbar region: Secondary | ICD-10-CM | POA: Diagnosis not present

## 2017-07-08 DIAGNOSIS — M5136 Other intervertebral disc degeneration, lumbar region: Secondary | ICD-10-CM | POA: Diagnosis not present

## 2017-07-08 DIAGNOSIS — M50122 Cervical disc disorder at C5-C6 level with radiculopathy: Secondary | ICD-10-CM | POA: Diagnosis not present

## 2017-07-08 DIAGNOSIS — M9901 Segmental and somatic dysfunction of cervical region: Secondary | ICD-10-CM | POA: Diagnosis not present

## 2017-07-08 DIAGNOSIS — M9905 Segmental and somatic dysfunction of pelvic region: Secondary | ICD-10-CM | POA: Diagnosis not present

## 2017-07-08 DIAGNOSIS — M9903 Segmental and somatic dysfunction of lumbar region: Secondary | ICD-10-CM | POA: Diagnosis not present

## 2017-07-11 ENCOUNTER — Encounter: Payer: Self-pay | Admitting: Internal Medicine

## 2017-07-11 ENCOUNTER — Ambulatory Visit: Payer: PPO | Admitting: Internal Medicine

## 2017-07-11 VITALS — BP 128/72 | HR 60 | Ht 73.0 in | Wt 201.1 lb

## 2017-07-11 DIAGNOSIS — I10 Essential (primary) hypertension: Secondary | ICD-10-CM

## 2017-07-11 DIAGNOSIS — R072 Precordial pain: Secondary | ICD-10-CM | POA: Diagnosis not present

## 2017-07-11 DIAGNOSIS — R002 Palpitations: Secondary | ICD-10-CM | POA: Diagnosis not present

## 2017-07-11 DIAGNOSIS — R197 Diarrhea, unspecified: Secondary | ICD-10-CM | POA: Diagnosis not present

## 2017-07-11 DIAGNOSIS — R42 Dizziness and giddiness: Secondary | ICD-10-CM | POA: Diagnosis not present

## 2017-07-11 DIAGNOSIS — E559 Vitamin D deficiency, unspecified: Secondary | ICD-10-CM | POA: Diagnosis not present

## 2017-07-11 DIAGNOSIS — M9901 Segmental and somatic dysfunction of cervical region: Secondary | ICD-10-CM | POA: Diagnosis not present

## 2017-07-11 DIAGNOSIS — M9905 Segmental and somatic dysfunction of pelvic region: Secondary | ICD-10-CM | POA: Diagnosis not present

## 2017-07-11 DIAGNOSIS — E782 Mixed hyperlipidemia: Secondary | ICD-10-CM

## 2017-07-11 DIAGNOSIS — M50122 Cervical disc disorder at C5-C6 level with radiculopathy: Secondary | ICD-10-CM | POA: Diagnosis not present

## 2017-07-11 DIAGNOSIS — I493 Ventricular premature depolarization: Secondary | ICD-10-CM

## 2017-07-11 DIAGNOSIS — M9903 Segmental and somatic dysfunction of lumbar region: Secondary | ICD-10-CM | POA: Diagnosis not present

## 2017-07-11 DIAGNOSIS — M5136 Other intervertebral disc degeneration, lumbar region: Secondary | ICD-10-CM | POA: Diagnosis not present

## 2017-07-11 LAB — BASIC METABOLIC PANEL
BUN/Creatinine Ratio: 11 (ref 10–24)
BUN: 16 mg/dL (ref 8–27)
CO2: 23 mmol/L (ref 20–29)
Calcium: 9.1 mg/dL (ref 8.6–10.2)
Chloride: 101 mmol/L (ref 96–106)
Creatinine, Ser: 1.51 mg/dL — ABNORMAL HIGH (ref 0.76–1.27)
GFR calc Af Amer: 49 mL/min/{1.73_m2} — ABNORMAL LOW (ref >59)
GFR calc non Af Amer: 43 mL/min/{1.73_m2} — ABNORMAL LOW (ref >59)
Glucose: 88 mg/dL (ref 65–99)
Potassium: 4.2 mmol/L (ref 3.5–5.2)
Sodium: 142 mmol/L (ref 134–144)

## 2017-07-11 LAB — CBC
Hematocrit: 43.2 % (ref 37.5–51.0)
Hemoglobin: 14.8 g/dL (ref 13.0–17.7)
MCH: 30.4 pg (ref 26.6–33.0)
MCHC: 34.3 g/dL (ref 31.5–35.7)
MCV: 89 fL (ref 79–97)
Platelets: 149 10*3/uL — ABNORMAL LOW (ref 150–379)
RBC: 4.87 x10E6/uL (ref 4.14–5.80)
RDW: 14.8 % (ref 12.3–15.4)
WBC: 6.2 10*3/uL (ref 3.4–10.8)

## 2017-07-11 NOTE — Patient Instructions (Signed)
Your physician recommends that you continue on your current medications as directed. Please refer to the Current Medication list given to you today.  Your physician recommends that you return for lab work (bmet, cbc)  Your physician has recommended that you wear an event monitor. Event monitors are medical devices that record the heart's electrical activity. Doctors most often [place] these monitors to diagnose arrhythmias. Arrhythmias are problems with the speed or rhythm of the heartbeat. The monitor is a small, portable device. You can wear one while you do your normal daily activities. This is usually used to diagnose what is causing palpitations/syncope (passing out).   Please call to schedule this if you have another episode of palpitations/racing heart beat.
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
## 2017-07-13 ENCOUNTER — Other Ambulatory Visit: Payer: Self-pay | Admitting: *Deleted

## 2017-07-13 DIAGNOSIS — M9901 Segmental and somatic dysfunction of cervical region: Secondary | ICD-10-CM | POA: Diagnosis not present

## 2017-07-13 DIAGNOSIS — M9903 Segmental and somatic dysfunction of lumbar region: Secondary | ICD-10-CM | POA: Diagnosis not present

## 2017-07-13 DIAGNOSIS — M50122 Cervical disc disorder at C5-C6 level with radiculopathy: Secondary | ICD-10-CM | POA: Diagnosis not present

## 2017-07-13 DIAGNOSIS — M9905 Segmental and somatic dysfunction of pelvic region: Secondary | ICD-10-CM | POA: Diagnosis not present

## 2017-07-13 DIAGNOSIS — M5136 Other intervertebral disc degeneration, lumbar region: Secondary | ICD-10-CM | POA: Diagnosis not present

## 2017-07-13 DIAGNOSIS — E559 Vitamin D deficiency, unspecified: Secondary | ICD-10-CM

## 2017-07-14 DIAGNOSIS — M5136 Other intervertebral disc degeneration, lumbar region: Secondary | ICD-10-CM | POA: Diagnosis not present

## 2017-07-14 DIAGNOSIS — M9901 Segmental and somatic dysfunction of cervical region: Secondary | ICD-10-CM | POA: Diagnosis not present

## 2017-07-14 DIAGNOSIS — M50122 Cervical disc disorder at C5-C6 level with radiculopathy: Secondary | ICD-10-CM | POA: Diagnosis not present

## 2017-07-14 DIAGNOSIS — M9905 Segmental and somatic dysfunction of pelvic region: Secondary | ICD-10-CM | POA: Diagnosis not present

## 2017-07-14 DIAGNOSIS — M9903 Segmental and somatic dysfunction of lumbar region: Secondary | ICD-10-CM | POA: Diagnosis not present

## 2017-07-15 LAB — SPECIMEN STATUS REPORT

## 2017-07-15 LAB — VITAMIN D 25 HYDROXY (VIT D DEFICIENCY, FRACTURES): Vit D, 25-Hydroxy: 29 ng/mL — ABNORMAL LOW (ref 30.0–100.0)

## 2017-07-18 DIAGNOSIS — M50122 Cervical disc disorder at C5-C6 level with radiculopathy: Secondary | ICD-10-CM | POA: Diagnosis not present

## 2017-07-18 DIAGNOSIS — M9903 Segmental and somatic dysfunction of lumbar region: Secondary | ICD-10-CM | POA: Diagnosis not present

## 2017-07-18 DIAGNOSIS — M5136 Other intervertebral disc degeneration, lumbar region: Secondary | ICD-10-CM | POA: Diagnosis not present

## 2017-07-18 DIAGNOSIS — M9901 Segmental and somatic dysfunction of cervical region: Secondary | ICD-10-CM | POA: Diagnosis not present

## 2017-07-18 DIAGNOSIS — M9905 Segmental and somatic dysfunction of pelvic region: Secondary | ICD-10-CM | POA: Diagnosis not present

## 2017-07-20 DIAGNOSIS — M50122 Cervical disc disorder at C5-C6 level with radiculopathy: Secondary | ICD-10-CM | POA: Diagnosis not present

## 2017-07-20 DIAGNOSIS — M5136 Other intervertebral disc degeneration, lumbar region: Secondary | ICD-10-CM | POA: Diagnosis not present

## 2017-07-20 DIAGNOSIS — M9905 Segmental and somatic dysfunction of pelvic region: Secondary | ICD-10-CM | POA: Diagnosis not present

## 2017-07-20 DIAGNOSIS — M9903 Segmental and somatic dysfunction of lumbar region: Secondary | ICD-10-CM | POA: Diagnosis not present

## 2017-07-20 DIAGNOSIS — M9901 Segmental and somatic dysfunction of cervical region: Secondary | ICD-10-CM | POA: Diagnosis not present

## 2017-07-25 ENCOUNTER — Other Ambulatory Visit: Payer: Self-pay | Admitting: *Deleted

## 2017-07-25 DIAGNOSIS — M9901 Segmental and somatic dysfunction of cervical region: Secondary | ICD-10-CM | POA: Diagnosis not present

## 2017-07-25 DIAGNOSIS — M50122 Cervical disc disorder at C5-C6 level with radiculopathy: Secondary | ICD-10-CM | POA: Diagnosis not present

## 2017-07-25 DIAGNOSIS — M9905 Segmental and somatic dysfunction of pelvic region: Secondary | ICD-10-CM | POA: Diagnosis not present

## 2017-07-25 DIAGNOSIS — M5136 Other intervertebral disc degeneration, lumbar region: Secondary | ICD-10-CM | POA: Diagnosis not present

## 2017-07-25 DIAGNOSIS — M9903 Segmental and somatic dysfunction of lumbar region: Secondary | ICD-10-CM | POA: Diagnosis not present

## 2017-07-25 MED ORDER — OMEPRAZOLE 40 MG PO CPDR: 40.0000 mg | DELAYED_RELEASE_CAPSULE | Freq: Two times a day (BID) | ORAL | 0 refills | Status: DC

## 2017-07-26 DIAGNOSIS — M9905 Segmental and somatic dysfunction of pelvic region: Secondary | ICD-10-CM | POA: Diagnosis not present

## 2017-07-26 DIAGNOSIS — M9903 Segmental and somatic dysfunction of lumbar region: Secondary | ICD-10-CM | POA: Diagnosis not present

## 2017-07-26 DIAGNOSIS — M9901 Segmental and somatic dysfunction of cervical region: Secondary | ICD-10-CM | POA: Diagnosis not present

## 2017-07-26 DIAGNOSIS — M5136 Other intervertebral disc degeneration, lumbar region: Secondary | ICD-10-CM | POA: Diagnosis not present

## 2017-07-26 DIAGNOSIS — M50122 Cervical disc disorder at C5-C6 level with radiculopathy: Secondary | ICD-10-CM | POA: Diagnosis not present

## 2017-07-27 ENCOUNTER — Other Ambulatory Visit: Payer: Self-pay | Admitting: Family Medicine

## 2017-07-28 DIAGNOSIS — M9903 Segmental and somatic dysfunction of lumbar region: Secondary | ICD-10-CM | POA: Diagnosis not present

## 2017-07-28 DIAGNOSIS — M5136 Other intervertebral disc degeneration, lumbar region: Secondary | ICD-10-CM | POA: Diagnosis not present

## 2017-07-28 DIAGNOSIS — M9901 Segmental and somatic dysfunction of cervical region: Secondary | ICD-10-CM | POA: Diagnosis not present

## 2017-07-28 DIAGNOSIS — M9905 Segmental and somatic dysfunction of pelvic region: Secondary | ICD-10-CM | POA: Diagnosis not present

## 2017-07-28 DIAGNOSIS — M50122 Cervical disc disorder at C5-C6 level with radiculopathy: Secondary | ICD-10-CM | POA: Diagnosis not present

## 2017-08-01 DIAGNOSIS — M9903 Segmental and somatic dysfunction of lumbar region: Secondary | ICD-10-CM | POA: Diagnosis not present

## 2017-08-01 DIAGNOSIS — M5136 Other intervertebral disc degeneration, lumbar region: Secondary | ICD-10-CM | POA: Diagnosis not present

## 2017-08-01 DIAGNOSIS — M9901 Segmental and somatic dysfunction of cervical region: Secondary | ICD-10-CM | POA: Diagnosis not present

## 2017-08-01 DIAGNOSIS — M50122 Cervical disc disorder at C5-C6 level with radiculopathy: Secondary | ICD-10-CM | POA: Diagnosis not present

## 2017-08-01 DIAGNOSIS — M9905 Segmental and somatic dysfunction of pelvic region: Secondary | ICD-10-CM | POA: Diagnosis not present

## 2017-08-04 DIAGNOSIS — M50122 Cervical disc disorder at C5-C6 level with radiculopathy: Secondary | ICD-10-CM | POA: Diagnosis not present

## 2017-08-04 DIAGNOSIS — M5136 Other intervertebral disc degeneration, lumbar region: Secondary | ICD-10-CM | POA: Diagnosis not present

## 2017-08-04 DIAGNOSIS — M9901 Segmental and somatic dysfunction of cervical region: Secondary | ICD-10-CM | POA: Diagnosis not present

## 2017-08-04 DIAGNOSIS — M9905 Segmental and somatic dysfunction of pelvic region: Secondary | ICD-10-CM | POA: Diagnosis not present

## 2017-08-04 DIAGNOSIS — M9903 Segmental and somatic dysfunction of lumbar region: Secondary | ICD-10-CM | POA: Diagnosis not present

## 2017-08-10 DIAGNOSIS — M9903 Segmental and somatic dysfunction of lumbar region: Secondary | ICD-10-CM | POA: Diagnosis not present

## 2017-08-10 DIAGNOSIS — M9905 Segmental and somatic dysfunction of pelvic region: Secondary | ICD-10-CM | POA: Diagnosis not present

## 2017-08-10 DIAGNOSIS — M5136 Other intervertebral disc degeneration, lumbar region: Secondary | ICD-10-CM | POA: Diagnosis not present

## 2017-08-10 DIAGNOSIS — M50122 Cervical disc disorder at C5-C6 level with radiculopathy: Secondary | ICD-10-CM | POA: Diagnosis not present

## 2017-08-10 DIAGNOSIS — M9901 Segmental and somatic dysfunction of cervical region: Secondary | ICD-10-CM | POA: Diagnosis not present

## 2017-08-18 DIAGNOSIS — M9905 Segmental and somatic dysfunction of pelvic region: Secondary | ICD-10-CM | POA: Diagnosis not present

## 2017-08-18 DIAGNOSIS — M5136 Other intervertebral disc degeneration, lumbar region: Secondary | ICD-10-CM | POA: Diagnosis not present

## 2017-08-18 DIAGNOSIS — M50122 Cervical disc disorder at C5-C6 level with radiculopathy: Secondary | ICD-10-CM | POA: Diagnosis not present

## 2017-08-18 DIAGNOSIS — M9901 Segmental and somatic dysfunction of cervical region: Secondary | ICD-10-CM | POA: Diagnosis not present

## 2017-08-18 DIAGNOSIS — M9903 Segmental and somatic dysfunction of lumbar region: Secondary | ICD-10-CM | POA: Diagnosis not present

## 2017-08-25 DIAGNOSIS — M50122 Cervical disc disorder at C5-C6 level with radiculopathy: Secondary | ICD-10-CM | POA: Diagnosis not present

## 2017-08-25 DIAGNOSIS — M5136 Other intervertebral disc degeneration, lumbar region: Secondary | ICD-10-CM | POA: Diagnosis not present

## 2017-08-25 DIAGNOSIS — M9901 Segmental and somatic dysfunction of cervical region: Secondary | ICD-10-CM | POA: Diagnosis not present

## 2017-08-25 DIAGNOSIS — M9903 Segmental and somatic dysfunction of lumbar region: Secondary | ICD-10-CM | POA: Diagnosis not present

## 2017-08-25 DIAGNOSIS — M9905 Segmental and somatic dysfunction of pelvic region: Secondary | ICD-10-CM | POA: Diagnosis not present

## 2017-09-14 ENCOUNTER — Ambulatory Visit (INDEPENDENT_AMBULATORY_CARE_PROVIDER_SITE_OTHER): Payer: PPO | Admitting: Family Medicine

## 2017-09-14 ENCOUNTER — Encounter: Payer: Self-pay | Admitting: Family Medicine

## 2017-09-14 VITALS — BP 131/72 | HR 58 | Temp 97.5°F | Ht 73.0 in | Wt 203.0 lb

## 2017-09-14 DIAGNOSIS — R202 Paresthesia of skin: Secondary | ICD-10-CM

## 2017-09-14 DIAGNOSIS — E559 Vitamin D deficiency, unspecified: Secondary | ICD-10-CM | POA: Diagnosis not present

## 2017-09-14 DIAGNOSIS — J019 Acute sinusitis, unspecified: Secondary | ICD-10-CM

## 2017-09-14 DIAGNOSIS — L219 Seborrheic dermatitis, unspecified: Secondary | ICD-10-CM | POA: Diagnosis not present

## 2017-09-14 DIAGNOSIS — B9689 Other specified bacterial agents as the cause of diseases classified elsewhere: Secondary | ICD-10-CM

## 2017-09-14 MED ORDER — CEFDINIR 300 MG PO CAPS: 300.0000 mg | ORAL_CAPSULE | Freq: Two times a day (BID) | ORAL | 0 refills | Status: AC

## 2017-09-14 NOTE — Patient Instructions (Signed)
As we discussed, the azithromycin might promote an abnormal heart rhythm.  For this reason, I have prescribed you Omnicef to take twice a day for the next 10 days.  He may continue the Mucinex and nasal spray.  Make sure that you are drinking plenty of water.  For your scalp rash, shampoos that have selenium sulfide are most effective.  Ask the pharmacist and they will direct you to be used shampoos.  For the tingling in your scalp, this may be related to this possible diagnosis of temporal arteritis.  I am going to send a message to your primary care doctor to make sure that this is addressed.  I am checking your vitamin D today.  I will contact you with the results of this.   Seborrheic Dermatitis, Adult Seborrheic dermatitis is a skin disease that causes red, scaly patches. It usually occurs on the scalp, and it is often called dandruff. The patches may appear on other parts of the body. Skin patches tend to appear where there are many oil glands in the skin. Areas of the body that are commonly affected include:  Scalp.  Skin folds of the body.  Ears.  Eyebrows.  Neck.  Face.  Armpits.  The bearded area of men's faces.  The condition may come and go for no known reason, and it is often long-lasting (chronic). What are the causes? The cause of this condition is not known. What increases the risk? This condition is more likely to develop in people who:  Have certain conditions, such as: ? HIV (human immunodeficiency virus). ? AIDS (acquired immunodeficiency syndrome). ? Parkinson disease. ? Mood disorders, such as depression.  Are 74-81 years old.  What are the signs or symptoms? Symptoms of this condition include:  Thick scales on the scalp.  Redness on the face or in the armpits.  Skin that is flaky. The flakes may be white or yellow.  Skin that seems oily or dry but is not helped with moisturizers.  Itching or burning in the affected areas.  How is this  diagnosed? This condition is diagnosed with a medical history and physical exam. A sample of your skin may be tested (skin biopsy). You may need to see a skin specialist (dermatologist). How is this treated? There is no cure for this condition, but treatment can help to manage the symptoms. You may get treatment to remove scales, lower the risk of skin infection, and reduce swelling or itching. Treatment may include:  Creams that reduce swelling and irritation (steroids).  Creams that reduce skin yeast.  Medicated shampoo, soaps, moisturizing creams, or ointments.  Medicated moisturizing creams or ointments.  Follow these instructions at home:  Apply over-the-counter and prescription medicines only as told by your health care provider.  Use any medicated shampoo, soaps, skin creams, or ointments only as told by your health care provider.  Keep all follow-up visits as told by your health care provider. This is important. Contact a health care provider if:  Your symptoms do not improve with treatment.  Your symptoms get worse.  You have new symptoms. This information is not intended to replace advice given to you by your health care provider. Make sure you discuss any questions you have with your health care provider. Document Released: 05/31/2005 Document Revised: 12/19/2015 Document Reviewed: 09/18/2015 Elsevier Interactive Patient Education  [employer].

## 2017-09-14 NOTE — Progress Notes (Signed)
Subjective: CC: Sinuses PCP: Claretta Fraise, MD ALP:[id number] C Ronald Russell is a 83 y.o. male presenting to clinic today for:  1. Sinus symptoms Patient reports onset of sinus pressure, mild intermittent dizziness and congestion about 2 weeks ago.  He notes that he started using Flonase and Mucinex which seemed to help a little bit but then symptoms suddenly got worse.  He is worried that this may progress to a bronchitis and therefore came in for an evaluation.  He is also using Zyrtec.  Has used sinus rinses in the past but noted that this caused sores in his nose.  No purulent discharge from the nares.  He notes that he has had loose stools with Augmentin and some cephalosporins in the past.  Past medical history significant for symptomatic PVCs.  2.  Scalp rash Patient reports a long-standing history, greater than 1 year, of an itchy, red rash on his scalp.  He is currently using Pantene shampoo and conditioner with little improvement in symptoms.  3.  Tingling of bilateral temples Patient notes that he has had a long-standing history of tingling in bilateral temples.  He notes that he was previously treated for a suspected temporal arteritis and had a temporal artery biopsy which was negative per his report.  Because he was responded to prednisone, he was kept on this for some time.  He notes that he did later developed a rash on his right upper extremity which was thought to be an allergic dermatitis and was treated with an injected steroid.  He notes that this caused the rash to be significantly worse and it was realized that it was in fact a shingles rash.  Since then, he has not been on any oral steroids.   ROS: Per HPI  Allergies  Allergen Reactions  . Augmentin [Amoxicillin-Pot Clavulanate] Diarrhea  . Ciprofloxacin Nausea Only  . Codeine Other (See Comments) and Nausea And Vomiting    Severe headache, nausea and vomiting  . Oxycodone Nausea And Vomiting  . Uloric [Febuxostat] Other  (See Comments)    Hypertension with palpitations and a sense of fuzziness and dizziness at the left side of his head  . Vicodin [Hydrocodone-Acetaminophen] Nausea And Vomiting  . Hydrocodone-Acetaminophen Nausea Only   Past Medical History:  Diagnosis Date  . Allergy   . Anxiety   . Bladder cancer ([place]) 2010   bladder  . Cataract   . Chronic bronchitis ([place])    "yearly the last 3 yrs" (02/13/2013)  . Clotting disorder ([place])   . Exertional shortness of breath    "the last 6 months" (02/13/2013)  . GERD (gastroesophageal reflux disease)   . History of blood transfusion 1949   "seeral" (02/13/2013)  . HOH (hard of hearing)   . Hypertension   . Ocular migraine    "not often" (02/13/2013)  . Pneumonia    "last time ~ 2 yr ago; had it before that too" (02/13/2013)  . PONV (postoperative nausea and vomiting)   . Seasonal allergies   . Shingles    has neuropathy since it happened in 6/17  . Temporal arteritis ([facility])     Current Outpatient Medications:  .  acetaminophen (TYLENOL) 325 MG tablet, Take 2 tablets (650 mg total) by mouth every 6 (six) hours as needed for mild pain (or Fever >/= 101)., Disp: 30 tablet, Rfl: 0 .  atenolol (TENORMIN) 50 MG tablet, TAKE 1 TABLET BY MOUTH EVERY DAY, Disp: 90 tablet, Rfl: 0 .  CALCIUM-VITAMIN D PO, Take  2,000 mg by mouth daily., Disp: , Rfl:  .  guaiFENesin (MUCINEX) 600 MG 12 hr tablet, Take 600 mg by mouth 2 (two) times daily., Disp: , Rfl:  .  omeprazole (PRILOSEC) 40 MG capsule, Take 1 capsule (40 mg total) by mouth 2 (two) times daily., Disp: 180 capsule, Rfl: 0 .  POTASSIUM PO, Take by mouth daily., Disp: , Rfl:  .  pravastatin (PRAVACHOL) 40 MG tablet, TAKE 1 TABLET BY MOUTH EVERY DAY, Disp: 90 tablet, Rfl: 0 Social History   Socioeconomic History  . Marital status: Legally Separated    Spouse name: Blanch Media  . Number of children: 4  . Years of education: 88  . Highest education level: Not on file  Occupational History  . Occupation: Retired      Comment: Human resources officer  . Financial resource strain: Not on file  . Food insecurity:    Worry: Not on file    Inability: Not on file  . Transportation needs:    Medical: Not on file    Non-medical: Not on file  Tobacco Use  . Smoking status: Former Smoker    Packs/day: 0.75    Years: 3.00    Pack years: 2.25    Types: Cigarettes  . Smokeless tobacco: Never Used  . Tobacco comment: 02/13/2013 "quit smoking age 55"  Substance and Sexual Activity  . Alcohol use: No    Comment: 02/13/2013 "probably a pint of whiskey/wk up til I was probably 83 yr old"  . Drug use: No  . Sexual activity: Yes  Lifestyle  . Physical activity:    Days per week: Not on file    Minutes per session: Not on file  . Stress: Not on file  Relationships  . Social connections:    Talks on phone: Not on file    Gets together: Not on file    Attends religious service: Not on file    Active member of club or organization: Not on file    Attends meetings of clubs or organizations: Not on file    Relationship status: Not on file  . Intimate partner violence:    Fear of current or ex partner: Not on file    Emotionally abused: Not on file    Physically abused: Not on file    Forced sexual activity: Not on file  Other Topics Concern  . Not on file  Social History Narrative   Lives with wife   Caffeine use: 15-16oz soda per day, no soda   Family History  Problem Relation Age of Onset  . Cancer Mother        breast  . Alzheimer's disease Father   . Anemia Neg Hx   . Arrhythmia Neg Hx   . Asthma Neg Hx   . Clotting disorder Neg Hx   . Fainting Neg Hx   . Heart attack Neg Hx   . Heart disease Neg Hx   . Heart failure Neg Hx   . Hyperlipidemia Neg Hx   . Hypertension Neg Hx   . Migraines Neg Hx     Objective: Office vital signs reviewed. BP 131/72   Pulse (!) 58   Temp (!) 97.5 F (36.4 C) (Oral)   Ht 6\' 1"  (1.854 m)   Wt 203 lb (92.1 kg)   BMI 26.78 kg/m   Physical Examination:   General: Awake, alert, well nourished, nontoxic, No acute distress HEENT: no TTP to sinuses    Neck: No masses palpated.  No lymphadenopathy    Eyes: PERRLA, extraocular membranes intact, sclera white    Nose: nasal turbinates moist, clear nasal discharge    Throat: moist mucus membranes, no erythema, no tonsillar exudate.  Airway is patent Cardio: regular rate and rhythm, S1S2 heard, no murmurs appreciated Pulm: clear to auscultation bilaterally, no wheezes, rhonchi or rales; normal work of breathing on room air Skin: Mild scalp rash consistent with a seborrheic dermatitis noted.  No evidence of secondary bacterial infection.  Assessment/ Plan: 83 y.o. male   1. Acute bacterial sinusitis Given duration of symptoms, will treat empirically with Omnicef 300 mg p.o. twice daily for the next 10 days.  May continue Flonase, Zyrtec and Mucinex if needed.  Home care instructions reviewed and handout was provided.  Follow-up as needed.  2. Seborrheic dermatitis of scalp I recommended that patient obtain a shampoo containing selenium sulfide for what appears to be a seborrheic dermatitis of the scalp.  This appears to be a mild case.  If patient is refractory to this, could consider prescription ketoconazole shampoo.  3. Tingling I reviewed his records and it appears that he was referred in 2017 to neurology who thought he had trigeminal neuralgia as workup for temporal arteritis was negative.  Per that note, it appears that Dr. Lucia Gaskins was still very confident that patient had temporal arteritis.  It was reiterated that the neurologist was also not sure if he may or may not have had temporal arteritis.  It appears that he was tapered off of steroids at that time.  I did recommend the patient follow-up with PCP for further discussion of this.  He voiced good understanding of follow-up. - Basic Metabolic Panel  4. Vitamin D deficiency At the end of visit, patient did asked that I recheck his vitamin D  and potassium levels.  These have been added. - VITAMIN D 25 Hydroxy (Vit-D Deficiency, Fractures)   Orders Placed This Encounter  Procedures  . VITAMIN D 25 Hydroxy (Vit-D Deficiency, Fractures)  . Basic Metabolic Panel   Meds ordered this encounter  Medications  . cefdinir (OMNICEF) 300 MG capsule    Sig: Take 1 capsule (300 mg total) by mouth 2 (two) times daily for 10 days. 1 po BID    Dispense:  20 capsule    Refill:  [place], DO [place] [id number]

## 2017-09-15 LAB — VITAMIN D 25 HYDROXY (VIT D DEFICIENCY, FRACTURES): Vit D, 25-Hydroxy: 30.4 ng/mL (ref 30.0–100.0)

## 2017-09-15 LAB — BASIC METABOLIC PANEL
BUN/Creatinine Ratio: 10 (ref 10–24)
BUN: 15 mg/dL (ref 8–27)
CO2: 21 mmol/L (ref 20–29)
Calcium: 9 mg/dL (ref 8.6–10.2)
Chloride: 104 mmol/L (ref 96–106)
Creatinine, Ser: 1.51 mg/dL — ABNORMAL HIGH (ref 0.76–1.27)
GFR calc Af Amer: 49 mL/min/{1.73_m2} — ABNORMAL LOW (ref >59)
GFR calc non Af Amer: 43 mL/min/{1.73_m2} — ABNORMAL LOW (ref >59)
Glucose: 101 mg/dL — ABNORMAL HIGH (ref 65–99)
Potassium: 4.3 mmol/L (ref 3.5–5.2)
Sodium: 141 mmol/L (ref 134–144)

## 2017-09-20 ENCOUNTER — Encounter: Payer: Self-pay | Admitting: Family Medicine

## 2017-09-20 ENCOUNTER — Ambulatory Visit (INDEPENDENT_AMBULATORY_CARE_PROVIDER_SITE_OTHER): Payer: PPO | Admitting: Family Medicine

## 2017-09-20 VITALS — BP 120/70 | HR 57 | Temp 97.1°F | Ht 73.0 in | Wt 203.0 lb

## 2017-09-20 DIAGNOSIS — L821 Other seborrheic keratosis: Secondary | ICD-10-CM | POA: Diagnosis not present

## 2017-09-20 DIAGNOSIS — I493 Ventricular premature depolarization: Secondary | ICD-10-CM | POA: Diagnosis not present

## 2017-09-20 DIAGNOSIS — L723 Sebaceous cyst: Secondary | ICD-10-CM | POA: Diagnosis not present

## 2017-09-20 DIAGNOSIS — L819 Disorder of pigmentation, unspecified: Secondary | ICD-10-CM

## 2017-09-20 NOTE — Patient Instructions (Signed)
I have referred you to Dr Elvera Lennox for the skin lesion on your face.  I think that it is an irritated Seborrheic Keratosis but because it is pigmented and now spontaneously bleeding we need to make sure that it is not something cancerous.  Seborrheic Keratosis Seborrheic keratosis is a common, noncancerous (benign) skin growth. This condition causes waxy, rough, tan, brown, or black spots to appear on the skin. These skin growths can be flat or raised. What are the causes? The cause of this condition is not known. What increases the risk? This condition is more likely to develop in:  People who have a family history of seborrheic keratosis.  People who are 46 or older.  People who are pregnant.  People who have had estrogen replacement therapy.  What are the signs or symptoms? This condition often occurs on the face, chest, shoulders, back, or other areas. These growths:  Are usually painless, but may become irritated and itchy.  Can be yellow, brown, black, or other colors.  Are slightly raised or have a flat surface.  Are sometimes rough or wart-like in texture.  Are often waxy on the surface.  Are round or oval-shaped.  Sometimes look like they are "stuck on."  Often occur in groups, but may occur as a single growth.  How is this diagnosed? This condition is diagnosed with a medical history and physical exam. A sample of the growth may be tested (skin biopsy). You may need to see a skin specialist (dermatologist). How is this treated? Treatment is not usually needed for this condition, unless the growths are irritated or are often bleeding. You may also choose to have the growths removed if you do not like their appearance. Most commonly, these growths are treated with a procedure in which liquid nitrogen is applied to "freeze" off the growth (cryosurgery). They may also be burned off with electricity or cut off. Follow these instructions at home:  Watch your growth for  any changes.  Keep all follow-up visits as told by your health care provider. This is important.  Do not scratch or pick at the growth or growths. This can cause them to become irritated or infected. Contact a health care provider if:  You suddenly have many new growths.  Your growth bleeds, itches, or hurts.  Your growth suddenly becomes larger or changes color. This information is not intended to replace advice given to you by your health care provider. Make sure you discuss any questions you have with your health care provider. Document Released: 07/03/2010 Document Revised: 11/06/2015 Document Reviewed: 10/16/2014 Elsevier Interactive Patient Education  2018 [employer].

## 2017-09-20 NOTE — Progress Notes (Signed)
Subjective: CC: skin tag PCP: Claretta Fraise, MD GYI:RSWNIO Ronald Russell is a 83 y.o. male presenting to clinic today for:  1. Skin tag Patient reports a lesion on the right cheek that has been present for about a year.  He notes that it has started becoming discolored and spontaneously bleeding lately.  He denies personal history of skin cancer.  He notes that he has multiple lesions over his body that have been evaluated by a dermatologist previously.  He was in [place] and had significant sun exposure.  He recalls one sunburn at age 57.  He intermittently uses sunblock but often wears a hat.  He has not had a full body check in some time.  He notes some distrust of dermatology because he was previously diagnosed with a contact dermatitis on his right wrist and treated with intralesional corticosteroids.  Apparently, per his report this was in fact shingles and this caused a more severe shingles outbreak.  No known family history of melanoma.  2.  Heart palpitations Patient reports that yesterday evening, he felt like his heart was fluttering quite a bit.  This has been evaluated by cardiology in the past and he was noted to have symptomatic PVCs.  He was wondering if perhaps the increased fluttering was related to the oral antibiotic that he is on.  He is currently treated with a cephalosporin for a bacterial sinusitis that was diagnosed on 09/14/2017.  He is using plain Mucinex over-the-counter for chest decongestion.  He reports compliance with atenolol.  Denies dizziness or presyncope.  No chest pain or shortness of breath.  He does note that when the heart is racing though he seems a fatigued and needs to sit down in order for it to resolve.  He reports that he was instructed by his cardiologist to call if he had sustained irregular fluttering but he notes that it has resolved this morning so he is not called them.   ROS: Per HPI  Allergies  Allergen Reactions  . Augmentin [Amoxicillin-Pot  Clavulanate] Diarrhea  . Ciprofloxacin Nausea Only  . Codeine Other (See Comments) and Nausea And Vomiting    Severe headache, nausea and vomiting  . Oxycodone Nausea And Vomiting  . Uloric [Febuxostat] Other (See Comments)    Hypertension with palpitations and a sense of fuzziness and dizziness at the left side of his head  . Vicodin [Hydrocodone-Acetaminophen] Nausea And Vomiting  . Hydrocodone-Acetaminophen Nausea Only   Past Medical History:  Diagnosis Date  . Allergy   . Anxiety   . Bladder cancer ([place]) 2010   bladder  . Cataract   . Chronic bronchitis ([place])    "yearly the last 3 yrs" (02/13/2013)  . Clotting disorder ([place])   . Exertional shortness of breath    "the last 6 months" (02/13/2013)  . GERD (gastroesophageal reflux disease)   . History of blood transfusion 1949   "seeral" (02/13/2013)  . HOH (hard of hearing)   . Hypertension   . Ocular migraine    "not often" (02/13/2013)  . Pneumonia    "last time ~ 2 yr ago; had it before that too" (02/13/2013)  . PONV (postoperative nausea and vomiting)   . Seasonal allergies   . Shingles    has neuropathy since it happened in 6/17  . Temporal arteritis ([facility])     Current Outpatient Medications:  .  acetaminophen (TYLENOL) 325 MG tablet, Take 2 tablets (650 mg total) by mouth every 6 (six) hours as needed  for mild pain (or Fever >/= 101)., Disp: 30 tablet, Rfl: 0 .  atenolol (TENORMIN) 50 MG tablet, TAKE 1 TABLET BY MOUTH EVERY DAY, Disp: 90 tablet, Rfl: 0 .  CALCIUM-VITAMIN D PO, Take 2,000 mg by mouth daily., Disp: , Rfl:  .  cefdinir (OMNICEF) 300 MG capsule, Take 1 capsule (300 mg total) by mouth 2 (two) times daily for 10 days. 1 po BID, Disp: 20 capsule, Rfl: 0 .  guaiFENesin (MUCINEX) 600 MG 12 hr tablet, Take 600 mg by mouth 2 (two) times daily., Disp: , Rfl:  .  omeprazole (PRILOSEC) 40 MG capsule, Take 1 capsule (40 mg total) by mouth 2 (two) times daily., Disp: 180 capsule, Rfl: 0 .  POTASSIUM PO, Take by mouth daily.,  Disp: , Rfl:  .  pravastatin (PRAVACHOL) 40 MG tablet, TAKE 1 TABLET BY MOUTH EVERY DAY, Disp: 90 tablet, Rfl: 0 Social History   Socioeconomic History  . Marital status: Legally Separated    Spouse name: Blanch Media  . Number of children: 4  . Years of education: 53  . Highest education level: Not on file  Occupational History  . Occupation: Retired    Comment: Human resources officer  . Financial resource strain: Not on file  . Food insecurity:    Worry: Not on file    Inability: Not on file  . Transportation needs:    Medical: Not on file    Non-medical: Not on file  Tobacco Use  . Smoking status: Former Smoker    Packs/day: 0.75    Years: 3.00    Pack years: 2.25    Types: Cigarettes  . Smokeless tobacco: Never Used  . Tobacco comment: 02/13/2013 "quit smoking age 75"  Substance and Sexual Activity  . Alcohol use: No    Comment: 02/13/2013 "probably a pint of whiskey/wk up til I was probably 83 yr old"  . Drug use: No  . Sexual activity: Yes  Lifestyle  . Physical activity:    Days per week: Not on file    Minutes per session: Not on file  . Stress: Not on file  Relationships  . Social connections:    Talks on phone: Not on file    Gets together: Not on file    Attends religious service: Not on file    Active member of club or organization: Not on file    Attends meetings of clubs or organizations: Not on file    Relationship status: Not on file  . Intimate partner violence:    Fear of current or ex partner: Not on file    Emotionally abused: Not on file    Physically abused: Not on file    Forced sexual activity: Not on file  Other Topics Concern  . Not on file  Social History Narrative   Lives with wife   Caffeine use: 15-16oz soda per day, no soda   Family History  Problem Relation Age of Onset  . Cancer Mother        breast  . Alzheimer's disease Father   . Anemia Neg Hx   . Arrhythmia Neg Hx   . Asthma Neg Hx   . Clotting disorder Neg Hx   . Fainting  Neg Hx   . Heart attack Neg Hx   . Heart disease Neg Hx   . Heart failure Neg Hx   . Hyperlipidemia Neg Hx   . Hypertension Neg Hx   . Migraines Neg Hx  Objective: Office vital signs reviewed. BP 120/70   Pulse (!) 57   Temp (!) 97.1 F (36.2 Ronald) (Oral)   Ht 6\' 1"  (1.854 m)   Wt 203 lb (92.1 kg)   BMI 26.78 kg/m   Physical Examination:  General: Awake, alert, well nourished, No acute distress Cardio: slightly bradycardic w/ regular rhythm, S1S2 heard, no murmurs appreciated Pulm: clear to auscultation bilaterally, no wheezes, rhonchi or rales; normal work of breathing on room air Extremities: warm, well perfused, No edema, cyanosis or clubbing; +2 pulses bilaterally Skin: Along the right jaw there is a 2 cm x 1 cm seborrheic keratosis.  Along the right cheek, there is a half a centimeter times a 1 cm pigmented lesion that appears to be a seborrheic keratosis but does have evidence of dried blood along it.  At the T4-T5 level there is a 2 cm, well-circumscribed soft tissue mass that appears to be consistent with a sebaceous cyst versus lipoma.  Assessment/ Plan: 83 y.o. male   1. Pigmented skin lesion of uncertain nature Clinically looks to be a seborrheic keratosis but spontaneous bleeding and recent change in color is somewhat concerning, particularly given his history of significant sun exposure.  I will refer to dermatology for further evaluation.  Would consider biopsy.  May need Mohs procedure. - Ambulatory referral to Dermatology  2. Seborrheic keratoses Reassurance.  Wear sunscreen, protective clothing. - Ambulatory referral to Dermatology  3. Symptomatic PVCs I recommended that if this were to recurrent that he should see his cardiologist again.  Reasons for emergent evaluation in the emergency department discussed.  Patient was good understanding.  4. Sebaceous cyst Clinically appears to be a sebaceous cyst, as this would be an unusual location for a lipoma.  Does  not appear infected at this time.  Will have dermatology evaluate and consider excision. - Ambulatory referral to Dermatology   Orders Placed This Encounter  Procedures  . Ambulatory referral to Dermatology    Referral Priority:   Routine    Referral Type:   Consultation    Referral Reason:   Specialty Services Required    Requested Specialty:   Dermatology    Number of Visits Requested:   1   No orders of the defined types were placed in this encounter.    Janora Norlander, DO [facility] [phone]

## 2017-10-04 ENCOUNTER — Telehealth: Payer: Self-pay | Admitting: Family Medicine

## 2017-10-05 ENCOUNTER — Encounter: Payer: Self-pay | Admitting: Family Medicine

## 2017-10-05 ENCOUNTER — Ambulatory Visit (INDEPENDENT_AMBULATORY_CARE_PROVIDER_SITE_OTHER): Payer: PPO | Admitting: Family Medicine

## 2017-10-05 VITALS — BP 135/65 | HR 56 | Ht 73.0 in | Wt 204.0 lb

## 2017-10-05 DIAGNOSIS — K591 Functional diarrhea: Secondary | ICD-10-CM

## 2017-10-05 DIAGNOSIS — L819 Disorder of pigmentation, unspecified: Secondary | ICD-10-CM | POA: Diagnosis not present

## 2017-10-05 MED ORDER — CHOLESTYRAMINE 4 G PO PACK: 4.0000 g | PACK | Freq: Four times a day (QID) | ORAL | 11 refills | Status: DC

## 2017-10-05 MED ORDER — PRAVASTATIN SODIUM 40 MG PO TABS: 40.0000 mg | ORAL_TABLET | Freq: Every day | ORAL | 1 refills | Status: DC

## 2017-10-05 MED ORDER — OMEPRAZOLE 40 MG PO CPDR: 40.0000 mg | DELAYED_RELEASE_CAPSULE | Freq: Two times a day (BID) | ORAL | 1 refills | Status: DC

## 2017-10-05 MED ORDER — ATENOLOL 50 MG PO TABS: 50.0000 mg | ORAL_TABLET | Freq: Every day | ORAL | 1 refills | Status: DC

## 2017-10-05 NOTE — Telephone Encounter (Signed)
Informed patient of his appt Dr Denna Haggard

## 2017-10-11 ENCOUNTER — Encounter: Payer: Self-pay | Admitting: Family Medicine

## 2017-10-11 ENCOUNTER — Other Ambulatory Visit: Payer: Self-pay | Admitting: Dermatology

## 2017-10-11 DIAGNOSIS — D485 Neoplasm of uncertain behavior of skin: Secondary | ICD-10-CM | POA: Diagnosis not present

## 2017-10-11 DIAGNOSIS — L820 Inflamed seborrheic keratosis: Secondary | ICD-10-CM | POA: Diagnosis not present

## 2017-10-11 DIAGNOSIS — D229 Melanocytic nevi, unspecified: Secondary | ICD-10-CM | POA: Diagnosis not present

## 2017-10-11 NOTE — Progress Notes (Signed)
Chief Complaint  Patient presents with  . Rash    pt here today c/o rash and also some loose stools    HPI  Patient presents today for stools are loose chronically.  They have not changed recently.  He will have 2-3 loose bowel movements per day sometimes 2-3 better in the morning than none the rest of the day.  The rash is actually a lesion on the right cheek that he wants checked.Marland Kitchen PMH: Smoking status noted ROS: Per HPI  Objective: BP 135/65   Pulse (!) 56   Ht 6\' 1"  (1.854 m)   Wt 204 lb (92.5 kg)   BMI 26.91 kg/m  Gen: NAD, alert, cooperative with exam HEENT: NCAT, EOMI, PERRL CV: RRR, good S1/S2, no murmur Resp: CTABL, no wheezes, non-labored Abd: SNTND, BS present, no guarding or organomegaly Skin: There is a 6 mm pedunculated lesion on the right cheek near its epicenter.  It is darker than the surrounding tissue with a bluish-brown appearance it is circumscribed. Neuro: Alert and oriented, No gross deficits  Assessment and plan:  1. Functional diarrhea   2. Pigmented skin lesion of uncertain nature     Meds ordered this encounter  Medications  . cholestyramine (QUESTRAN) 4 g packet    Sig: Take 1 packet (4 g total) by mouth 4 (four) times daily.    Dispense:  120 each    Refill:  11  . atenolol (TENORMIN) 50 MG tablet    Sig: Take 1 tablet (50 mg total) by mouth daily.    Dispense:  90 tablet    Refill:  1  . omeprazole (PRILOSEC) 40 MG capsule    Sig: Take 1 capsule (40 mg total) by mouth 2 (two) times daily.    Dispense:  180 capsule    Refill:  1  . pravastatin (PRAVACHOL) 40 MG tablet    Sig: Take 1 tablet (40 mg total) by mouth daily.    Dispense:  90 tablet    Refill:  1    I recommended that he see dermatology for removal of the lesion.  Follow up as needed.  Claretta Fraise, MD

## 2017-10-24 ENCOUNTER — Telehealth: Payer: Self-pay | Admitting: Family Medicine

## 2017-10-24 NOTE — Telephone Encounter (Signed)
Gallbladder was taken out last June. From that he states he has diarrhea. It is irritating hemorroids wants something called into [facility] in [place] for his hemorrhoids. Leave message per patient. Please advise

## 2017-10-25 ENCOUNTER — Other Ambulatory Visit: Payer: Self-pay | Admitting: Family Medicine

## 2017-10-25 MED ORDER — HYDROCORTISONE ACE-PRAMOXINE 1-1 % RE CREA: 1.0000 "application " | TOPICAL_CREAM | Freq: Two times a day (BID) | RECTAL | 5 refills | Status: DC

## 2017-10-25 NOTE — Telephone Encounter (Signed)
I sent in the requested prescription 

## 2017-10-25 NOTE — Telephone Encounter (Signed)
Pt aware.

## 2017-11-11 ENCOUNTER — Encounter: Payer: Self-pay | Admitting: Family Medicine

## 2017-11-11 ENCOUNTER — Ambulatory Visit (INDEPENDENT_AMBULATORY_CARE_PROVIDER_SITE_OTHER): Payer: PPO | Admitting: Family Medicine

## 2017-11-11 VITALS — BP 107/65 | HR 54 | Temp 96.9°F | Ht 73.0 in | Wt 202.4 lb

## 2017-11-11 DIAGNOSIS — R002 Palpitations: Secondary | ICD-10-CM

## 2017-11-11 MED ORDER — METOPROLOL TARTRATE 50 MG PO TABS: 50.0000 mg | ORAL_TABLET | Freq: Two times a day (BID) | ORAL | 3 refills | Status: DC

## 2017-11-11 NOTE — Progress Notes (Signed)
   HPI  Patient presents today here for palpitations.  Patient describes a heart flutter, he states that it flutters 3 times and goes back to normal, this is been going on more and more over the last 6 days.  States that he does have fatigue and lightheadedness whenever the palpitations,  Patient states he had symptoms similar to this previously when he was checked with a Holter monitor by cardiology.  His atenolol was increased to 50 mg once daily at that time.  Patient also states that he has had some sinus pressure, this started today. He has sensation of "floating head" without association with palpitations.  He reports very good medication compliance  PMH: Smoking status noted ROS: Per HPI  Objective: BP 107/65   Pulse (!) 54   Temp (!) 96.9 F (36.1 C) (Oral)   Ht 6\' 1"  (1.854 m)   Wt 202 lb 6.4 oz (91.8 kg)   BMI 26.70 kg/m  Gen: NAD, alert, cooperative with exam HEENT: NCAT CV: RRR, good S1/S2, no murmur Resp: CTABL, no wheezes, non-labored Ext: No edema, warm Neuro: Alert and oriented, No gross deficits  Assessment and plan:  #Palpitations Slightly symptomatic, refer to cardiology, he would like to have a cardiologist closer to home subsegmental [place]. Changing atenolol 50 mg once daily to metoprolol 25 mg twice daily, we discussed reasons and criteria for increasing to 50 mg twice daily.   EKG today shows right bundle unchanged from the last EKG, his rate is 51 and he has a first-degree AV block which is also unchanged from previous. He is currently well beta blocked, however most of his symptoms have been early in the morning before he takes his atenolol at 10 AM, I believe that his atenolol is only covering him for about 20 hours rather than 24 hours.     Orders Placed This Encounter  Procedures  . EKG 12-Lead    Laroy Apple, MD [facility] 11/11/2017, 3:59 PM

## 2017-11-11 NOTE — Patient Instructions (Signed)
Great to see you!  Stop atenolol and start metoprolol at 1/2 pill twice daily, if you're having higher blood pressure or fast heart beat then increase it to 1 whole tablet twice daily.   They will call you to make an appointment in [place].

## 2017-11-15 ENCOUNTER — Telehealth: Payer: Self-pay | Admitting: *Deleted

## 2017-11-15 ENCOUNTER — Telehealth: Payer: Self-pay | Admitting: Family Medicine

## 2017-11-15 NOTE — Telephone Encounter (Signed)
Patient is going to talk to triage nurse

## 2017-11-15 NOTE — Telephone Encounter (Signed)
Pt is taking Metoprolol 50 mg BID palpitations are much worse Pt denies chest pain, light-headedness, weakness or SOB Pt has appt with Dr Livia Snellen tomorrow Please advise

## 2017-11-15 NOTE — Telephone Encounter (Signed)
Ok to wait for appointemnt with PCP to measure BP and pulse.   Laroy Apple, MD [facility] 11/15/2017, 4:44 PM

## 2017-11-15 NOTE — Telephone Encounter (Signed)
Patient aware and verbalizes understanding. 

## 2017-11-16 ENCOUNTER — Ambulatory Visit (INDEPENDENT_AMBULATORY_CARE_PROVIDER_SITE_OTHER): Payer: PPO | Admitting: Family Medicine

## 2017-11-16 ENCOUNTER — Encounter: Payer: Self-pay | Admitting: Family Medicine

## 2017-11-16 VITALS — BP 127/67 | HR 58 | Temp 97.2°F | Ht 73.0 in | Wt 201.5 lb

## 2017-11-16 DIAGNOSIS — E559 Vitamin D deficiency, unspecified: Secondary | ICD-10-CM | POA: Diagnosis not present

## 2017-11-16 DIAGNOSIS — N400 Enlarged prostate without lower urinary tract symptoms: Secondary | ICD-10-CM

## 2017-11-16 DIAGNOSIS — Z0000 Encounter for general adult medical examination without abnormal findings: Secondary | ICD-10-CM | POA: Diagnosis not present

## 2017-11-16 DIAGNOSIS — E785 Hyperlipidemia, unspecified: Secondary | ICD-10-CM | POA: Diagnosis not present

## 2017-11-16 MED ORDER — PANTOPRAZOLE SODIUM 40 MG PO TBEC: 40.0000 mg | DELAYED_RELEASE_TABLET | Freq: Every day | ORAL | 5 refills | Status: DC

## 2017-11-16 MED ORDER — METOPROLOL SUCCINATE ER 200 MG PO TB24: 200.0000 mg | ORAL_TABLET | Freq: Every day | ORAL | 5 refills | Status: DC

## 2017-11-16 NOTE — Patient Instructions (Signed)

## 2017-11-17 LAB — LIPID PANEL
Chol/HDL Ratio: 3.4 ratio (ref 0.0–5.0)
Cholesterol, Total: 131 mg/dL (ref 100–199)
HDL: 39 mg/dL — ABNORMAL LOW (ref >39)
LDL Calculated: 62 mg/dL (ref 0–99)
Triglycerides: 151 mg/dL — ABNORMAL HIGH (ref 0–149)
VLDL Cholesterol Cal: 30 mg/dL (ref 5–40)

## 2017-11-17 LAB — CBC WITH DIFFERENTIAL/PLATELET
Basophils Absolute: 0 10*3/uL (ref 0.0–0.2)
Basos: 1 %
EOS (ABSOLUTE): 0.2 10*3/uL (ref 0.0–0.4)
Eos: 2 %
Hematocrit: 42.7 % (ref 37.5–51.0)
Hemoglobin: 14.4 g/dL (ref 13.0–17.7)
Immature Grans (Abs): 0 10*3/uL (ref 0.0–0.1)
Immature Granulocytes: 0 %
Lymphocytes Absolute: 1.6 10*3/uL (ref 0.7–3.1)
Lymphs: 18 %
MCH: 31.2 pg (ref 26.6–33.0)
MCHC: 33.7 g/dL (ref 31.5–35.7)
MCV: 92 fL (ref 79–97)
Monocytes Absolute: 0.6 10*3/uL (ref 0.1–0.9)
Monocytes: 7 %
Neutrophils Absolute: 6.2 10*3/uL (ref 1.4–7.0)
Neutrophils: 72 %
Platelets: 136 10*3/uL — ABNORMAL LOW (ref 150–450)
RBC: 4.62 x10E6/uL (ref 4.14–5.80)
RDW: 14.8 % (ref 12.3–15.4)
WBC: 8.6 10*3/uL (ref 3.4–10.8)

## 2017-11-17 LAB — PSA, TOTAL AND FREE
PSA, Free Pct: 30 %
PSA, Free: 0.09 ng/mL
Prostate Specific Ag, Serum: 0.3 ng/mL (ref 0.0–4.0)

## 2017-11-17 LAB — CMP14+EGFR
ALT: 20 IU/L (ref 0–44)
AST: 20 IU/L (ref 0–40)
Albumin/Globulin Ratio: 1.8 (ref 1.2–2.2)
Albumin: 4.2 g/dL (ref 3.5–4.7)
Alkaline Phosphatase: 54 IU/L (ref 39–117)
BUN/Creatinine Ratio: 10 (ref 10–24)
BUN: 12 mg/dL (ref 8–27)
Bilirubin Total: 0.6 mg/dL (ref 0.0–1.2)
CO2: 22 mmol/L (ref 20–29)
Calcium: 9 mg/dL (ref 8.6–10.2)
Chloride: 103 mmol/L (ref 96–106)
Creatinine, Ser: 1.22 mg/dL (ref 0.76–1.27)
GFR calc Af Amer: 64 mL/min/{1.73_m2} (ref >59)
GFR calc non Af Amer: 55 mL/min/{1.73_m2} — ABNORMAL LOW (ref >59)
Globulin, Total: 2.4 g/dL (ref 1.5–4.5)
Glucose: 95 mg/dL (ref 65–99)
Potassium: 4.4 mmol/L (ref 3.5–5.2)
Sodium: 140 mmol/L (ref 134–144)
Total Protein: 6.6 g/dL (ref 6.0–8.5)

## 2017-11-17 LAB — VITAMIN D 25 HYDROXY (VIT D DEFICIENCY, FRACTURES): Vit D, 25-Hydroxy: 33.4 ng/mL (ref 30.0–100.0)

## 2017-11-22 ENCOUNTER — Ambulatory Visit (INDEPENDENT_AMBULATORY_CARE_PROVIDER_SITE_OTHER): Payer: PPO

## 2017-11-22 ENCOUNTER — Encounter: Payer: Self-pay | Admitting: Family Medicine

## 2017-11-22 DIAGNOSIS — R002 Palpitations: Secondary | ICD-10-CM | POA: Diagnosis not present

## 2017-11-22 NOTE — Progress Notes (Signed)
48 hour holter monitor applied to patient at 11:15 am.  Patient will return tomorrow to have new monitor attached.

## 2017-11-23 ENCOUNTER — Encounter: Payer: Self-pay | Admitting: Nurse Practitioner

## 2017-11-23 ENCOUNTER — Ambulatory Visit: Payer: PPO | Admitting: Nurse Practitioner

## 2017-11-23 ENCOUNTER — Telehealth (HOSPITAL_COMMUNITY): Payer: Self-pay

## 2017-11-23 ENCOUNTER — Encounter: Payer: Self-pay | Admitting: Family Medicine

## 2017-11-23 VITALS — BP 122/62 | HR 71 | Ht 72.0 in | Wt 201.0 lb

## 2017-11-23 DIAGNOSIS — R002 Palpitations: Secondary | ICD-10-CM | POA: Diagnosis not present

## 2017-11-23 DIAGNOSIS — R079 Chest pain, unspecified: Secondary | ICD-10-CM | POA: Diagnosis not present

## 2017-11-23 DIAGNOSIS — I493 Ventricular premature depolarization: Secondary | ICD-10-CM | POA: Diagnosis not present
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
## 2017-11-23 NOTE — Telephone Encounter (Signed)
Detailed message about his Lexiscan Myoview test was left on his am. Advised to call us if he had any questions. S.Lucia Mccreadie EMTP

## 2017-11-23 NOTE — Patient Instructions (Signed)
We will be checking the following labs today - TSH   Medication Instructions:    Continue with your current medicines.     Testing/Procedures To Be Arranged:  Lexiscan  Follow-Up:   See Dr. Harrington Challenger in about a month for follow up    Other Special Instructions:  You are scheduled for a Myocardial Perfusion Imaging Study on ___________________________________ at _____________________________________________.   Please arrive 15 minutes prior to your appointment time for registration and insurance purposes.   The test will take approximately 3 to 4 hours to complete; you may bring reading material. If someone comes with you to your appointment, they will need to remain in the main lobby due to limited space in the testing area.   If you are pregnant or breastfeeding, please notify the nuclear lab prior to your appointment.   How to prepare for your Myocardial Perfusion test:   Do not eat or drink 3 hours prior to your test, except you may have water.    Do not consume products containing caffeine (regular or decaffeinated) 12 hours prior to your test (ex: coffee, chocolate, soda, tea)   Do bring a list of your current medications with you. If not listed below, you may take your medications as normal.     Bring any held medication to your appointment, as you may be required to take it once the test is complete.   Do wear comfortable clothes (no dresses or overalls) and walking shoes. Tennis shoes are preferred. No heels or open toed shoes.  Do not wear cologne, perfume, aftershave or lotions (deodorant is allowed).   If these instructions are not followed, you test will have to be rescheduled.   Please report to [address] for your test. If you have questions or concerns about your appointment, please call the Nuclear Lab at [phone].  If you cannot keep your appointment, please provide 24 hour notification to the Nuclear lab to avoid a possible $50  charge to your account.           If you need a refill on your cardiac medications before your next appointment, please call your pharmacy.   Call the [place] office at [phone] if you have any questions, problems or concerns.

## 2017-11-24 ENCOUNTER — Ambulatory Visit (HOSPITAL_COMMUNITY): Payer: PPO | Attending: Cardiovascular Disease

## 2017-11-24 ENCOUNTER — Ambulatory Visit (INDEPENDENT_AMBULATORY_CARE_PROVIDER_SITE_OTHER): Payer: PPO | Admitting: Pediatrics

## 2017-11-24 ENCOUNTER — Telehealth: Payer: Self-pay | Admitting: Family Medicine

## 2017-11-24 ENCOUNTER — Encounter: Payer: Self-pay | Admitting: Pediatrics

## 2017-11-24 VITALS — BP 138/71 | HR 65 | Temp 97.3°F | Ht 72.0 in | Wt 202.0 lb

## 2017-11-24 DIAGNOSIS — R079 Chest pain, unspecified: Secondary | ICD-10-CM

## 2017-11-24 DIAGNOSIS — I4510 Unspecified right bundle-branch block: Secondary | ICD-10-CM | POA: Diagnosis not present

## 2017-11-24 DIAGNOSIS — R9439 Abnormal result of other cardiovascular function study: Secondary | ICD-10-CM | POA: Diagnosis not present

## 2017-11-24 DIAGNOSIS — I10 Essential (primary) hypertension: Secondary | ICD-10-CM | POA: Insufficient documentation

## 2017-11-24 DIAGNOSIS — R002 Palpitations: Secondary | ICD-10-CM | POA: Insufficient documentation

## 2017-11-24 LAB — MYOCARDIAL PERFUSION IMAGING
LV dias vol: 62 mL (ref 62–150)
LV sys vol: 9 mL
Peak HR: 75 {beats}/min
RATE: 0.33
Rest HR: 55 {beats}/min
SDS: 4
SRS: 10
SSS: 14
TID: 0.95

## 2017-11-24 LAB — TSH: TSH: 4.23 u[IU]/mL (ref 0.450–4.500)

## 2017-11-24 MED ORDER — TECHNETIUM TC 99M TETROFOSMIN IV KIT
32.8000 | PACK | Freq: Once | INTRAVENOUS | Status: AC | PRN
  Administered 2017-11-24: 32.8 via INTRAVENOUS
  Filled 2017-11-24: qty 33

## 2017-11-24 MED ORDER — TECHNETIUM TC 99M TETROFOSMIN IV KIT
10.2000 | PACK | Freq: Once | INTRAVENOUS | Status: AC | PRN
  Administered 2017-11-24: 10.2 via INTRAVENOUS
  Filled 2017-11-24: qty 11

## 2017-11-24 MED ORDER — REGADENOSON 0.4 MG/5ML IV SOLN
0.4000 mg | Freq: Once | INTRAVENOUS | Status: AC
  Administered 2017-11-24: 0.4 mg via INTRAVENOUS

## 2017-11-24 NOTE — Progress Notes (Signed)
Subjective:   Patient ID: Ronald Russell, male    DOB: 10-26-1934, 83 y.o.   MRN: [id number] CC: Palpitations  HPI: Ronald Russell is a 83 y.o. male   He is here today with his significant other.  Walked into clinic today because of heart palpitations.  He had a stress test done this morning with cardiology.  He took his metoprolol about 2:00 in the afternoon.  He noticed more palpitations after taking the medicine.  1 week ago his atenolol 50 mg was stopped and he was started on metoprolol 100 mg XL.  2 days ago because of palpitations his metoprolol was increased to 200 mg once a day.  He started having palpitations about 6 weeks ago.  He has had 2 episodes of chest tightness, both associated with exertion, once a few weeks ago when walking uphill, once a few nights ago while walking.  He was seen by cardiology yesterday for the symptoms, concern for ischemia causing some of the symptoms.  Patient does not have any chest pain right now.  He had a Holter monitor that he is worn for 2 days 6/11 through 6/13.  The first 24 hours were available for review.  No episodes of atrial fibrillation.  He does have PACs and PVCs.  He says his blood pressures yesterday morning started systolics 696 when he first checked it, checked 15 minutes later was 117, 50 minutes later was 122.  When he was on the atenolol his systolic reading was regularly as low as 105.  He has a history of possible giant cell arteritis of his left eye.  Was treated with prednisone 2 years ago.  He says he was told by a neurologist that he did not have it.  He says he has Viagra at home, has not used for the last 6 months.  Relevant past medical, surgical, family and social history reviewed. Allergies and medications reviewed and updated. Social History   Tobacco Use  Smoking Status Former Smoker  . Packs/day: 0.75  . Years: 3.00  . Pack years: 2.25  . Types: Cigarettes  Smokeless Tobacco Never Used  Tobacco Comment   02/13/2013 "quit smoking age 23"   ROS: Per HPI   Objective:    BP 138/71   Pulse 65   Temp (!) 97.3 F (36.3 C) (Oral)   Ht 6' (1.829 m)   Wt 202 lb (91.6 kg)   BMI 27.40 kg/m   Wt Readings from Last 3 Encounters:  11/24/17 202 lb (91.6 kg)  11/24/17 201 lb (91.2 kg)  11/23/17 201 lb (91.2 kg)    Gen: NAD, alert, cooperative with exam, NCAT EYES: EOMI, no conjunctival injection, or no icterus LYMPH: no cervical LAD CV: NRRR, normal S1/S2, no murmur, distal pulses 2+ b/l Resp: CTABL, no wheezes, normal WOB Abd: +BS, soft, NTND. no guarding or organomegaly Ext: No edema, warm Neuro: Alert and oriented, strength equal b/l UE and LE, coordination grossly normal MSK: normal muscle bulk   Assessment & Plan:  Jelan was seen today for palpitations.  Diagnoses and all orders for this visit:  Heart palpitations Holter monitor from this week consistent with PACs and PVCs.  Stress test this morning show a distal LAD defect.  No chest pain now.  Blood pressure improved with recheck.  Will discuss with cardiology, patient will check blood pressures regularly at home, bring to next office visits.  Any chest pain, tightness, shortness of breath patient is to go to the emergency room.  -  EKG 12-Lead  I spent 25 minutes with the patient with over 50% of the encounter time dedicated to counseling on the above problems.   Follow up plan: 2 weeks. Assunta Found, MD [place]

## 2017-11-24 NOTE — Patient Instructions (Signed)
Keep track of your blood pressures at home, bring to all office visits

## 2017-11-25 ENCOUNTER — Encounter: Payer: Self-pay | Admitting: Interventional Cardiology

## 2017-11-25 ENCOUNTER — Ambulatory Visit: Payer: PPO | Admitting: Interventional Cardiology

## 2017-11-25 VITALS — BP 128/60 | HR 58 | Ht 72.0 in | Wt 201.0 lb

## 2017-11-25 DIAGNOSIS — Z01812 Encounter for preprocedural laboratory examination: Secondary | ICD-10-CM

## 2017-11-25 DIAGNOSIS — R0609 Other forms of dyspnea: Secondary | ICD-10-CM

## 2017-11-25 DIAGNOSIS — E782 Mixed hyperlipidemia: Secondary | ICD-10-CM

## 2017-11-25 DIAGNOSIS — R0600 Dyspnea, unspecified: Secondary | ICD-10-CM

## 2017-11-25 DIAGNOSIS — R9439 Abnormal result of other cardiovascular function study: Secondary | ICD-10-CM | POA: Diagnosis not present

## 2017-11-25 LAB — BASIC METABOLIC PANEL
BUN/Creatinine Ratio: 12 (ref 10–24)
BUN: 15 mg/dL (ref 8–27)
CO2: 23 mmol/L (ref 20–29)
Calcium: 8.8 mg/dL (ref 8.6–10.2)
Chloride: 106 mmol/L (ref 96–106)
Creatinine, Ser: 1.29 mg/dL — ABNORMAL HIGH (ref 0.76–1.27)
GFR calc Af Amer: 60 mL/min/{1.73_m2} (ref >59)
GFR calc non Af Amer: 52 mL/min/{1.73_m2} — ABNORMAL LOW (ref >59)
Glucose: 89 mg/dL (ref 65–99)
Potassium: 4.6 mmol/L (ref 3.5–5.2)
Sodium: 142 mmol/L (ref 134–144)

## 2017-11-25 LAB — CBC
Hematocrit: 44 % (ref 37.5–51.0)
Hemoglobin: 15.1 g/dL (ref 13.0–17.7)
MCH: 31.1 pg (ref 26.6–33.0)
MCHC: 34.3 g/dL (ref 31.5–35.7)
MCV: 91 fL (ref 79–97)
Platelets: 143 10*3/uL — ABNORMAL LOW (ref 150–450)
RBC: 4.85 x10E6/uL (ref 4.14–5.80)
RDW: 14.4 % (ref 12.3–15.4)
WBC: 7 10*3/uL (ref 3.4–10.8)

## 2017-11-25 MED ORDER — ISOSORBIDE MONONITRATE ER 30 MG PO TB24: 30.0000 mg | ORAL_TABLET | Freq: Every day | ORAL | 3 refills | Status: DC

## 2017-11-25 MED ORDER — ASPIRIN EC 81 MG PO TBEC: 81.0000 mg | DELAYED_RELEASE_TABLET | Freq: Every day | ORAL | 3 refills | Status: DC
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
## 2017-11-25 NOTE — Patient Instructions (Addendum)
Medication Instructions:  Your physician has recommended you make the following change in your medication:   1. START: Aspirin 81 mg once daily  2. START: isosorbide mononitrate (imdur) 30 mg once daily  Labwork: TODAY: CBC, BMET  Testing/Procedures: Your physician has requested that you have a cardiac catheterization on 11/01/17. Cardiac catheterization is used to diagnose and/or treat various heart conditions. Doctors may recommend this procedure for a number of different reasons. The most common reason is to evaluate chest pain. Chest pain can be a symptom of coronary artery disease (CAD), and cardiac catheterization can show whether plaque is narrowing or blocking your heart's arteries. This procedure is also used to evaluate the valves, as well as measure the blood flow and oxygen levels in different parts of your heart. For further information please visit [email or web address]. Please follow instruction sheet, as given.   Follow-Up: Your physician recommends that you schedule a follow-up appointment 2 weeks after heart catheterization with Truitt Merle, NP, Dr. Harrington Challenger, or an APP on Dr. Harrington Challenger' team    Any Other Special Instructions Will Be Listed Below (If Applicable).    [facility] [address] [place] [address] [place] [address] Dept: [phone] Loc: [phone]  Ronald Russell  11/25/2017  You are scheduled for a Cardiac Catheterization on Friday, June 21 with Dr. Larae Grooms.  1. Please arrive at the [facility] (Main Entrance A) at [facility]: [address] [place], [place] [address] at 5:30 AM (two hours before your procedure to ensure your preparation). Free valet parking service is available.   Special note: Every effort is made to have your procedure done on time. Please understand that emergencies sometimes delay scheduled procedures.  2. Diet: Do not eat or drink  anything after midnight prior to your procedure except sips of water to take medications.  3. Labs: TODAY: CBC, BMET  4. Medication instructions in preparation for your procedure:  On the morning of your procedure, take Aspirin 81 mg and any of your regular morning medicines.  You may use sips of water.  5. Plan for one night stay--bring personal belongings. 6. Bring a current list of your medications and current insurance cards. 7. You MUST have a responsible person to drive you home. 8. Someone MUST be with you the first 24 hours after you arrive home or your discharge will be delayed. 9. Please wear clothes that are easy to get on and off and wear slip-on shoes.  Thank you for allowing [place] to care for you!   -- [facility]    If you need a refill on your cardiac medications before your next appointment, please call your pharmacy.   Coronary Angiogram A coronary angiogram is an X-ray procedure that is used to examine the arteries in the heart. In this procedure, a dye (contrast dye) is injected through a long, thin tube (catheter). The catheter is inserted through the groin, wrist, or arm. The dye is injected into each artery, then X-rays are taken to show if there is a blockage in the arteries of the heart. This procedure can also show if you have valve disease or a disease of the aorta, and it can be used to check the overall function of your heart muscle. You may have a coronary angiogram if:  You are having chest pain, or other symptoms of angina, and you are at risk for heart disease.  You have  an abnormal electrocardiogram (ECG) or stress test.  You have chest pain and heart failure.  You are having irregular heart rhythms.  You and your health care provider determine that the benefits of the test information outweigh the risks of the procedure.  Let your health care provider know about:  Any allergies you have, including allergies to  contrast dye.  All medicines you are taking, including vitamins, herbs, eye drops, creams, and over-the-counter medicines.  Any problems you or family members have had with anesthetic medicines.  Any blood disorders you have.  Any surgeries you have had.  History of kidney problems or kidney failure.  Any medical conditions you have.  Whether you are pregnant or may be pregnant. What are the risks? Generally, this is a safe procedure. However, problems may occur, including:  Infection.  Allergic reaction to medicines or dyes that are used.  Bleeding from the access site or other locations.  Kidney injury, especially in people with impaired kidney function.  Stroke (rare).  Heart attack (rare).  Damage to other structures or organs.  What happens before the procedure? Staying hydrated Follow instructions from your health care provider about hydration, which may include:  Up to 2 hours before the procedure - you may continue to drink clear liquids, such as water, clear fruit juice, black coffee, and plain tea.  Eating and drinking restrictions Follow instructions from your health care provider about eating and drinking, which may include:  8 hours before the procedure - stop eating heavy meals or foods such as meat, fried foods, or fatty foods.  6 hours before the procedure - stop eating light meals or foods, such as toast or cereal.  2 hours before the procedure - stop drinking clear liquids.  General instructions  Ask your health care provider about: ? Changing or stopping your regular medicines. This is especially important if you are taking diabetes medicines or blood thinners. ? Taking medicines such as ibuprofen. These medicines can thin your blood. Do not take these medicines before your procedure if your health care provider instructs you not to, though aspirin may be recommended prior to coronary angiograms.  Plan to have someone take you home from the  hospital or clinic.  You may need to have blood tests or X-rays done. What happens during the procedure?  An IV tube will be inserted into one of your veins.  You will be given one or more of the following: ? A medicine to help you relax (sedative). ? A medicine to numb the area where the catheter will be inserted into an artery (local anesthetic).  To reduce your risk of infection: ? Your health care team will wash or sanitize their hands. ? Your skin will be washed with soap. ? Hair may be removed from the area where the catheter will be inserted.  You will be connected to a continuous ECG monitor.  The catheter will be inserted into an artery. The location may be in your groin, in your wrist, or in the fold of your arm (near your elbow).  A type of X-ray (fluoroscopy) will be used to help guide the catheter to the opening of the blood vessel that is being examined.  A dye will be injected into the catheter, and X-rays will be taken. The dye will help to show where any narrowing or blockages are located in the heart arteries.  Tell your health care provider if you have any chest pain or trouble breathing during the procedure.  If blockages are found, your health care provider may perform another procedure, such as inserting a coronary stent. The procedure may vary among health care providers and hospitals. What happens after the procedure?  After the procedure, you will need to keep the area still for a few hours, or for as long as told by your health care provider. If the procedure is done through the groin, you will be instructed to not bend and not cross your legs.  The insertion site will be checked frequently.  The pulse in your foot or wrist will be checked frequently.  You may have additional blood tests, X-rays, and a test that records the electrical activity of your heart (ECG).  Do not drive for 24 hours if you were given a sedative. Summary  A coronary angiogram  is an X-ray procedure that is used to look into the arteries in the heart.  During the procedure, a dye (contrast dye) is injected through a long, thin tube (catheter). The catheter is inserted through the groin, wrist, or arm.  Tell your health care provider about any allergies you have, including allergies to contrast dye.  After the procedure, you will need to keep the area still for a few hours, or for as long as told by your health care provider. This information is not intended to replace advice given to you by your health care provider. Make sure you discuss any questions you have with your health care provider. Document Released: 12/05/2002 Document Revised: 03/12/2016 Document Reviewed: 03/12/2016 Elsevier Interactive Patient Education  [employer].

## 2017-11-27 ENCOUNTER — Encounter: Payer: Self-pay | Admitting: Family Medicine
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
## 2017-11-28 ENCOUNTER — Ambulatory Visit: Payer: PPO | Admitting: Family Medicine

## 2017-11-28 ENCOUNTER — Telehealth: Payer: Self-pay | Admitting: *Deleted

## 2017-11-28 NOTE — Telephone Encounter (Signed)
S/w pt to change post cath ov appt to Truitt Merle, NP, cancelled Richardson Dopp, PA, appt.  Pt stated Toprol is not working still feels flutters.   Dr. Livia Snellen prescribed and increased dose.  Pt is to call Dr.Stacks office this am.  Pt stated when Dr.Ross had pt on atenolol that seemed to help for about one year. Went over cath instructions again today. Pt is concerned.  Will send to [place] to [place].

## 2017-11-28 NOTE — Telephone Encounter (Signed)
Would proceed with cath before changing any medicines as planned to see if blockages/CAD is causing his current symptoms.   After this, if needed we can change or send to EP for their recommendations.

## 2017-12-01 ENCOUNTER — Ambulatory Visit (INDEPENDENT_AMBULATORY_CARE_PROVIDER_SITE_OTHER): Payer: PPO | Admitting: Pediatrics

## 2017-12-01 ENCOUNTER — Other Ambulatory Visit (HOSPITAL_COMMUNITY): Payer: Self-pay

## 2017-12-01 ENCOUNTER — Encounter: Payer: Self-pay | Admitting: Pediatrics

## 2017-12-01 ENCOUNTER — Inpatient Hospital Stay (HOSPITAL_COMMUNITY)
Admission: EM | Admit: 2017-12-01 | Discharge: 2017-12-14 | DRG: 234 | Disposition: A | Discharge status: Home / Self Care | Payer: PPO | Attending: Thoracic Surgery (Cardiothoracic Vascular Surgery) | Admitting: Thoracic Surgery (Cardiothoracic Vascular Surgery)

## 2017-12-01 ENCOUNTER — Emergency Department (HOSPITAL_COMMUNITY): Payer: PPO

## 2017-12-01 ENCOUNTER — Other Ambulatory Visit: Payer: Self-pay

## 2017-12-01 ENCOUNTER — Telehealth: Payer: Self-pay | Admitting: *Deleted

## 2017-12-01 ENCOUNTER — Encounter (HOSPITAL_COMMUNITY): Payer: Self-pay | Admitting: Emergency Medicine

## 2017-12-01 VITALS — BP 139/73 | HR 59 | Temp 97.3°F | Ht 72.0 in | Wt 205.0 lb

## 2017-12-01 DIAGNOSIS — R42 Dizziness and giddiness: Secondary | ICD-10-CM | POA: Diagnosis not present

## 2017-12-01 DIAGNOSIS — Z7951 Long term (current) use of inhaled steroids: Secondary | ICD-10-CM | POA: Diagnosis not present

## 2017-12-01 DIAGNOSIS — J9811 Atelectasis: Secondary | ICD-10-CM | POA: Diagnosis present

## 2017-12-01 DIAGNOSIS — R0602 Shortness of breath: Secondary | ICD-10-CM | POA: Diagnosis not present

## 2017-12-01 DIAGNOSIS — Z87891 Personal history of nicotine dependence: Secondary | ICD-10-CM

## 2017-12-01 DIAGNOSIS — E876 Hypokalemia: Secondary | ICD-10-CM | POA: Diagnosis not present

## 2017-12-01 DIAGNOSIS — B029 Zoster without complications: Secondary | ICD-10-CM | POA: Diagnosis present

## 2017-12-01 DIAGNOSIS — I480 Paroxysmal atrial fibrillation: Secondary | ICD-10-CM | POA: Diagnosis not present

## 2017-12-01 DIAGNOSIS — D62 Acute posthemorrhagic anemia: Secondary | ICD-10-CM | POA: Diagnosis not present

## 2017-12-01 DIAGNOSIS — Z951 Presence of aortocoronary bypass graft: Secondary | ICD-10-CM | POA: Diagnosis not present

## 2017-12-01 DIAGNOSIS — F515 Nightmare disorder: Secondary | ICD-10-CM | POA: Diagnosis not present

## 2017-12-01 DIAGNOSIS — E785 Hyperlipidemia, unspecified: Secondary | ICD-10-CM | POA: Diagnosis present

## 2017-12-01 DIAGNOSIS — R0789 Other chest pain: Secondary | ICD-10-CM

## 2017-12-01 DIAGNOSIS — I25110 Atherosclerotic heart disease of native coronary artery with unstable angina pectoris: Secondary | ICD-10-CM | POA: Diagnosis present

## 2017-12-01 DIAGNOSIS — R079 Chest pain, unspecified: Secondary | ICD-10-CM

## 2017-12-01 DIAGNOSIS — Z7982 Long term (current) use of aspirin: Secondary | ICD-10-CM | POA: Diagnosis not present

## 2017-12-01 DIAGNOSIS — I2510 Atherosclerotic heart disease of native coronary artery without angina pectoris: Secondary | ICD-10-CM | POA: Diagnosis not present

## 2017-12-01 DIAGNOSIS — Z888 Allergy status to other drugs, medicaments and biological substances status: Secondary | ICD-10-CM

## 2017-12-01 DIAGNOSIS — I081 Rheumatic disorders of both mitral and tricuspid valves: Secondary | ICD-10-CM | POA: Diagnosis not present

## 2017-12-01 DIAGNOSIS — Z8551 Personal history of malignant neoplasm of bladder: Secondary | ICD-10-CM | POA: Diagnosis not present

## 2017-12-01 DIAGNOSIS — I10 Essential (primary) hypertension: Secondary | ICD-10-CM | POA: Diagnosis present

## 2017-12-01 DIAGNOSIS — I200 Unstable angina: Secondary | ICD-10-CM | POA: Diagnosis present

## 2017-12-01 DIAGNOSIS — M316 Other giant cell arteritis: Secondary | ICD-10-CM | POA: Diagnosis present

## 2017-12-01 DIAGNOSIS — F411 Generalized anxiety disorder: Secondary | ICD-10-CM | POA: Diagnosis present

## 2017-12-01 DIAGNOSIS — Z01810 Encounter for preprocedural cardiovascular examination: Secondary | ICD-10-CM | POA: Diagnosis not present

## 2017-12-01 DIAGNOSIS — Z79899 Other long term (current) drug therapy: Secondary | ICD-10-CM | POA: Diagnosis not present

## 2017-12-01 DIAGNOSIS — D696 Thrombocytopenia, unspecified: Secondary | ICD-10-CM | POA: Diagnosis present

## 2017-12-01 DIAGNOSIS — E7800 Pure hypercholesterolemia, unspecified: Secondary | ICD-10-CM | POA: Diagnosis not present

## 2017-12-01 DIAGNOSIS — E8770 Fluid overload, unspecified: Secondary | ICD-10-CM | POA: Diagnosis not present

## 2017-12-01 DIAGNOSIS — Z885 Allergy status to narcotic agent status: Secondary | ICD-10-CM | POA: Diagnosis not present

## 2017-12-01 DIAGNOSIS — R531 Weakness: Secondary | ICD-10-CM | POA: Diagnosis not present

## 2017-12-01 DIAGNOSIS — K219 Gastro-esophageal reflux disease without esophagitis: Secondary | ICD-10-CM | POA: Diagnosis present

## 2017-12-01 DIAGNOSIS — R072 Precordial pain: Secondary | ICD-10-CM | POA: Diagnosis not present

## 2017-12-01 DIAGNOSIS — M109 Gout, unspecified: Secondary | ICD-10-CM | POA: Diagnosis not present

## 2017-12-01 LAB — CBC
HCT: 42 % (ref 39.0–52.0)
Hemoglobin: 13.7 g/dL (ref 13.0–17.0)
MCH: 30.7 pg (ref 26.0–34.0)
MCHC: 32.6 g/dL (ref 30.0–36.0)
MCV: 94.2 fL (ref 78.0–100.0)
Platelets: 135 10*3/uL — ABNORMAL LOW (ref 150–400)
RBC: 4.46 MIL/uL (ref 4.22–5.81)
RDW: 14.3 % (ref 11.5–15.5)
WBC: 5.9 10*3/uL (ref 4.0–10.5)

## 2017-12-01 LAB — BASIC METABOLIC PANEL
Anion gap: 9 (ref 5–15)
BUN: 15 mg/dL (ref 6–20)
CO2: 22 mmol/L (ref 22–32)
Calcium: 8.5 mg/dL — ABNORMAL LOW (ref 8.9–10.3)
Chloride: 109 mmol/L (ref 101–111)
Creatinine, Ser: 1.42 mg/dL — ABNORMAL HIGH (ref 0.61–1.24)
GFR calc Af Amer: 52 mL/min — ABNORMAL LOW (ref >60)
GFR calc non Af Amer: 45 mL/min — ABNORMAL LOW (ref >60)
Glucose, Bld: 88 mg/dL (ref 65–99)
Potassium: 3.8 mmol/L (ref 3.5–5.1)
Sodium: 140 mmol/L (ref 135–145)

## 2017-12-01 LAB — I-STAT TROPONIN, ED: Troponin i, poc: 0 ng/mL (ref 0.00–0.08)

## 2017-12-01 MED ORDER — ASPIRIN 300 MG RE SUPP: 300.0000 mg | RECTAL | Status: AC

## 2017-12-01 MED ORDER — SODIUM CHLORIDE 0.9 % WEIGHT BASED INFUSION
3.0000 mL/kg/h | INTRAVENOUS | Status: DC
  Administered 2017-12-02: 3 mL/kg/h via INTRAVENOUS

## 2017-12-01 MED ORDER — SODIUM CHLORIDE 0.9 % IV SOLN: 250.0000 mL | INTRAVENOUS | Status: DC | PRN

## 2017-12-01 MED ORDER — SODIUM CHLORIDE 0.9 % WEIGHT BASED INFUSION: 1.0000 mL/kg/h | INTRAVENOUS | Status: DC

## 2017-12-01 MED ORDER — HEPARIN (PORCINE) IN NACL 100-0.45 UNIT/ML-% IJ SOLN
1050.0000 [IU]/h | Freq: Once | INTRAMUSCULAR | Status: AC
  Administered 2017-12-01: 1050 [IU]/h via INTRAVENOUS
  Filled 2017-12-01: qty 250

## 2017-12-01 MED ORDER — NITROGLYCERIN 2 % TD OINT
0.5000 [in_us] | TOPICAL_OINTMENT | Freq: Once | TRANSDERMAL | Status: AC
  Administered 2017-12-01: 0.5 [in_us] via TOPICAL
  Filled 2017-12-01: qty 1

## 2017-12-01 MED ORDER — HEPARIN (PORCINE) IN NACL 100-0.45 UNIT/ML-% IJ SOLN
1050.0000 [IU]/h | INTRAMUSCULAR | Status: DC
  Administered 2017-12-02: 1050 [IU]/h via INTRAVENOUS

## 2017-12-01 MED ORDER — ACETAMINOPHEN 325 MG PO TABS: 650.0000 mg | ORAL_TABLET | ORAL | Status: DC | PRN

## 2017-12-01 MED ORDER — SODIUM CHLORIDE 0.9% FLUSH: 3.0000 mL | Freq: Two times a day (BID) | INTRAVENOUS | Status: DC

## 2017-12-01 MED ORDER — ASPIRIN 81 MG PO CHEW
324.0000 mg | CHEWABLE_TABLET | ORAL | Status: AC
  Administered 2017-12-02: 324 mg via ORAL
  Filled 2017-12-01: qty 4

## 2017-12-01 MED ORDER — ASPIRIN 81 MG PO CHEW
81.0000 mg | CHEWABLE_TABLET | ORAL | Status: AC
  Administered 2017-12-02: 81 mg via ORAL
  Filled 2017-12-01: qty 1

## 2017-12-01 MED ORDER — NITROGLYCERIN 0.4 MG SL SUBL
0.4000 mg | SUBLINGUAL_TABLET | SUBLINGUAL | Status: DC | PRN
  Administered 2017-12-02: 0.4 mg via SUBLINGUAL
  Filled 2017-12-01: qty 1

## 2017-12-01 MED ORDER — NITROGLYCERIN 0.4 MG SL SUBL: 0.4000 mg | SUBLINGUAL_TABLET | SUBLINGUAL | Status: DC | PRN

## 2017-12-01 MED ORDER — ASPIRIN EC 81 MG PO TBEC: 81.0000 mg | DELAYED_RELEASE_TABLET | Freq: Every day | ORAL | Status: DC

## 2017-12-01 MED ORDER — SODIUM CHLORIDE 0.9% FLUSH: 3.0000 mL | INTRAVENOUS | Status: DC | PRN

## 2017-12-01 MED ORDER — ONDANSETRON HCL 4 MG/2ML IJ SOLN: 4.0000 mg | Freq: Four times a day (QID) | INTRAMUSCULAR | Status: DC | PRN

## 2017-12-01 NOTE — ED Notes (Signed)
EDP cleared pt to eat and drink. NPO @ Midnight

## 2017-12-01 NOTE — Telephone Encounter (Signed)
Pt contacted pre-catheterization scheduled at [facility] for: Friday June 21,2019 7:30 AM Verified arrival time and place: [place] Entrance A at: 5:30 AM  No solid food after midnight prior to cath, clear liquids until 5 AM day of procedure. Verified allergies in Epic Verified no diabetes medications.  AM meds can be  taken pre-cath with sip of water including: ASA 81  Confirmed patient has responsible person to drive home post procedure and observe patient for 24 hours: yes
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
## 2017-12-01 NOTE — Progress Notes (Signed)
ANTICOAGULATION CONSULT NOTE - Initial Consult  Pharmacy Consult for Heparin  Indication: chest pain/ACS  Allergies  Allergen Reactions  . Augmentin [Amoxicillin-Pot Clavulanate] Diarrhea  . Ciprofloxacin Diarrhea  . Codeine Nausea And Vomiting and Other (See Comments)    Severe headache  . Oxycodone Nausea And Vomiting  . Uloric [Febuxostat] Other (See Comments)    Hypertension with palpitations and a sense of fuzziness and dizziness at the left side of his head  . Vicodin [Hydrocodone-Acetaminophen] Nausea And Vomiting  . Tramadol Nausea And Vomiting    Patient Measurements: Height: 6' (182.9 cm) Weight: 202 lb 2.6 oz (91.7 kg) IBW/kg (Calculated) : 77.6  Vital Signs: Temp: 97.8 F (36.6 C) (06/20 2346) Temp Source: Oral (06/20 2346) BP: 170/76 (06/20 2346) Pulse Rate: 69 (06/20 2346)  Labs: Recent Labs    12/01/17 1852  HGB 13.7  HCT 42.0  PLT 135*  CREATININE 1.42*    Estimated Creatinine Clearance: 44.8 mL/min (A) (by C-G formula based on SCr of 1.42 mg/dL (H)).   Medical History: Past Medical History:  Diagnosis Date  . Allergy   . Anxiety   . Bladder cancer ([place]) 2010   bladder  . Cataract   . Chronic bronchitis ([place])    "yearly the last 3 yrs" (02/13/2013)  . Clotting disorder ([place])   . Exertional shortness of breath    "the last 6 months" (02/13/2013)  . GERD (gastroesophageal reflux disease)   . History of blood transfusion 1949   "seeral" (02/13/2013)  . HOH (hard of hearing)   . Hypertension   . Ocular migraine    "not often" (02/13/2013)  . Pneumonia    "last time ~ 2 yr ago; had it before that too" (02/13/2013)  . PONV (postoperative nausea and vomiting)   . Seasonal allergies   . Shingles    has neuropathy since it happened in 6/17  . Temporal arteritis [facility]: [facility])    Assessment: 83 y/o M with chest tightness, he already had a cath scheduled for 6/21, heparin was started by the MD at 1050 units/hr with bolus, H/H ok, plts 135, PTA meds  reviewed  Goal of Therapy:  Heparin level 0.3-0.7 units/ml Monitor platelets by anticoagulation protocol: Yes   Plan:  Cont heparin at 1050 units/hr 0730 HL Cath 6/21   Narda Bonds 12/01/2017,11:50 PM

## 2017-12-01 NOTE — ED Triage Notes (Signed)
Pt here for CP/SOB, had stress test last week and scheduled for cath in the morning, CP has been worse today.

## 2017-12-01 NOTE — ED Provider Notes (Signed)
Patient placed in Quick Look pathway, seen and evaluated   Chief Complaint: +cp/sob   HPI:   Patient scheduled for cardiac cath tomorrow at 5:30 am with Dr, Irish Lack. Patient has had ongoing cp/sob  But today has labored with any exertion. Fine at rest. Had a pos stress test last wek. Patient states that he took his bp meds and then had crushing chest pressure and pain down both arms. He states that he "flet like I could die." ROS: sob (one)  Physical Exam:   Gen: No distress  Neuro: Awake and Alert  Skin: Warm    Focused Exam: RRR, CTAB  needs room stat- notified nursing.   Initiation of care has begun. The patient has been counseled on the process, plan, and necessity for staying for the completion/evaluation, and the remainder of the medical screening examination    Margarita Mail, Hershal Coria 12/01/17 Venda Rodes, MD 12/01/17 2207

## 2017-12-01 NOTE — Progress Notes (Signed)
  Subjective:   Patient ID: Ronald Russell., male    DOB: 07-25-1934, 83 y.o.   MRN: [id number] CC: Chest Pain and Numbness (Bilateral arms)  HPI: Ronald BLOODGOOD Sr. is a 83 y.o. male   Here today with his significant other.  Came in because he has been having chest tightness in the center of his chest off and on.  Says he has been feeling "top-heavy" all day.  He associates this with the use of metoprolol.  He says he feels okay until he takes it. Has had similar symptoms past few days, worse today, he felt very nauseous earlier in the afternoon, had some chest tightness at the time and shortness of breath.  [place]  At rest his symptoms improve.  Walking into clinic from the parking lot he had similar chest tightness in the center of his chest.  Sitting in exam room now he says he feels fine.  He has not had any palpitations or skipped beats since starting Imdur last week.   He has a cardiac cath scheduled for tomorrow due to an abnormal stress test last week and ongoing symptoms of angina.  Relevant past medical, surgical, family and social history reviewed. Allergies and medications reviewed and updated. Social History   Tobacco Use  Smoking Status Former Smoker  . Packs/day: 0.75  . Years: 3.00  . Pack years: 2.25  . Types: Cigarettes  Smokeless Tobacco Never Used  Tobacco Comment   02/13/2013 "quit smoking age 9"   ROS: Per HPI   Objective:    BP 139/73   Pulse (!) 59   Temp (!) 97.3 F (36.3 C) (Oral)   Ht 6' (1.829 m)   Wt 205 lb (93 kg)   BMI 27.80 kg/m   Wt Readings from Last 3 Encounters:  12/01/17 205 lb (93 kg)  11/25/17 201 lb (91.2 kg)  11/24/17 202 lb (91.6 kg)    Gen: NAD, alert, cooperative with exam, NCAT EYES: EOMI, no conjunctival injection, or no icterus ENT:  TMs pearly gray b/l, OP without erythema LYMPH: no cervical LAD CV: NRRR, normal S1/S2, no murmur, distal pulses 2+ b/l Resp: CTABL, no  wheezes, normal WOB Abd: +BS, soft, NTND. no guarding or organomegaly Ext: No edema, warm Neuro: Alert and oriented, strength equal b/l UE and LE, coordination grossly normal MSK: normal muscle bulk  EKG: Heart rate 57.  V1 and V2 with inverted T waves compared with last EKG.  no ST elevation or depression.  Normal sinus rhythm.  First-degree AV block.  Assessment & Plan:  [name] was seen today for chest pain and numbness.  Diagnoses and all orders for this visit:  Chest tightness Symptoms consistent angina, worsen with exertion.  At rest symptoms resolved now.  Continually says he has been feeling poorly all day with the symptoms.  Recommend he be evaluated in the emergency room given ongoing symptoms throughout the day.  He has a cardiac catheterization scheduled for tomorrow morning.  -     EKG 12-Lead   Follow up plan: Return if symptoms worsen or fail to improve. Assunta Found, MD [place]
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
## 2017-12-01 NOTE — ED Notes (Signed)
EDP @ bedside

## 2017-12-02 ENCOUNTER — Other Ambulatory Visit: Payer: Self-pay | Admitting: *Deleted

## 2017-12-02 ENCOUNTER — Other Ambulatory Visit: Payer: Self-pay

## 2017-12-02 ENCOUNTER — Encounter (HOSPITAL_COMMUNITY): Payer: Self-pay

## 2017-12-02 ENCOUNTER — Ambulatory Visit (HOSPITAL_COMMUNITY): Admission: RE | Admit: 2017-12-02 | Payer: PPO | Source: Ambulatory Visit | Admitting: Interventional Cardiology

## 2017-12-02 ENCOUNTER — Encounter (HOSPITAL_COMMUNITY)
Admission: EM | Disposition: A | Discharge status: Home / Self Care | Payer: Self-pay | Source: Home / Self Care | Attending: Thoracic Surgery (Cardiothoracic Vascular Surgery)

## 2017-12-02 DIAGNOSIS — D62 Acute posthemorrhagic anemia: Secondary | ICD-10-CM | POA: Diagnosis not present

## 2017-12-02 DIAGNOSIS — Z8551 Personal history of malignant neoplasm of bladder: Secondary | ICD-10-CM | POA: Diagnosis not present

## 2017-12-02 DIAGNOSIS — B029 Zoster without complications: Secondary | ICD-10-CM | POA: Diagnosis present

## 2017-12-02 DIAGNOSIS — E785 Hyperlipidemia, unspecified: Secondary | ICD-10-CM | POA: Diagnosis present

## 2017-12-02 DIAGNOSIS — Z951 Presence of aortocoronary bypass graft: Secondary | ICD-10-CM | POA: Diagnosis not present

## 2017-12-02 DIAGNOSIS — Z885 Allergy status to narcotic agent status: Secondary | ICD-10-CM | POA: Diagnosis not present

## 2017-12-02 DIAGNOSIS — Z79899 Other long term (current) drug therapy: Secondary | ICD-10-CM | POA: Diagnosis not present

## 2017-12-02 DIAGNOSIS — F411 Generalized anxiety disorder: Secondary | ICD-10-CM | POA: Diagnosis present

## 2017-12-02 DIAGNOSIS — I2510 Atherosclerotic heart disease of native coronary artery without angina pectoris: Secondary | ICD-10-CM

## 2017-12-02 DIAGNOSIS — I480 Paroxysmal atrial fibrillation: Secondary | ICD-10-CM | POA: Diagnosis not present

## 2017-12-02 DIAGNOSIS — R42 Dizziness and giddiness: Secondary | ICD-10-CM | POA: Diagnosis not present

## 2017-12-02 DIAGNOSIS — E8770 Fluid overload, unspecified: Secondary | ICD-10-CM | POA: Diagnosis not present

## 2017-12-02 DIAGNOSIS — R079 Chest pain, unspecified: Secondary | ICD-10-CM | POA: Diagnosis present

## 2017-12-02 DIAGNOSIS — Z7951 Long term (current) use of inhaled steroids: Secondary | ICD-10-CM | POA: Diagnosis not present

## 2017-12-02 DIAGNOSIS — E7800 Pure hypercholesterolemia, unspecified: Secondary | ICD-10-CM | POA: Diagnosis not present

## 2017-12-02 DIAGNOSIS — F515 Nightmare disorder: Secondary | ICD-10-CM | POA: Diagnosis not present

## 2017-12-02 DIAGNOSIS — Z01810 Encounter for preprocedural cardiovascular examination: Secondary | ICD-10-CM | POA: Diagnosis not present

## 2017-12-02 DIAGNOSIS — D696 Thrombocytopenia, unspecified: Secondary | ICD-10-CM | POA: Diagnosis present

## 2017-12-02 DIAGNOSIS — I10 Essential (primary) hypertension: Secondary | ICD-10-CM

## 2017-12-02 DIAGNOSIS — Z7982 Long term (current) use of aspirin: Secondary | ICD-10-CM | POA: Diagnosis not present

## 2017-12-02 DIAGNOSIS — J9811 Atelectasis: Secondary | ICD-10-CM | POA: Diagnosis present

## 2017-12-02 DIAGNOSIS — M316 Other giant cell arteritis: Secondary | ICD-10-CM | POA: Diagnosis present

## 2017-12-02 DIAGNOSIS — Z87891 Personal history of nicotine dependence: Secondary | ICD-10-CM | POA: Diagnosis not present

## 2017-12-02 DIAGNOSIS — Z888 Allergy status to other drugs, medicaments and biological substances status: Secondary | ICD-10-CM | POA: Diagnosis not present

## 2017-12-02 DIAGNOSIS — I25110 Atherosclerotic heart disease of native coronary artery with unstable angina pectoris: Principal | ICD-10-CM

## 2017-12-02 DIAGNOSIS — I200 Unstable angina: Secondary | ICD-10-CM

## 2017-12-02 DIAGNOSIS — K219 Gastro-esophageal reflux disease without esophagitis: Secondary | ICD-10-CM | POA: Diagnosis present

## 2017-12-02 HISTORY — PX: LEFT HEART CATH AND CORONARY ANGIOGRAPHY: CATH118249

## 2017-12-02 LAB — LIPID PANEL
Cholesterol: 119 mg/dL (ref 0–200)
HDL: 36 mg/dL — ABNORMAL LOW (ref >40)
LDL Cholesterol: 55 mg/dL (ref 0–99)
Total CHOL/HDL Ratio: 3.3 RATIO
Triglycerides: 140 mg/dL (ref <150)
VLDL: 28 mg/dL (ref 0–40)

## 2017-12-02 LAB — BASIC METABOLIC PANEL
Anion gap: 6 (ref 5–15)
BUN: 14 mg/dL (ref 6–20)
CO2: 24 mmol/L (ref 22–32)
Calcium: 8.2 mg/dL — ABNORMAL LOW (ref 8.9–10.3)
Chloride: 110 mmol/L (ref 101–111)
Creatinine, Ser: 1.33 mg/dL — ABNORMAL HIGH (ref 0.61–1.24)
GFR calc Af Amer: 56 mL/min — ABNORMAL LOW (ref >60)
GFR calc non Af Amer: 48 mL/min — ABNORMAL LOW (ref >60)
Glucose, Bld: 85 mg/dL (ref 65–99)
Potassium: 3.7 mmol/L (ref 3.5–5.1)
Sodium: 140 mmol/L (ref 135–145)

## 2017-12-02 LAB — PROTIME-INR
INR: 1.08
Prothrombin Time: 13.9 seconds (ref 11.4–15.2)

## 2017-12-02 LAB — HEMOGLOBIN A1C
Hgb A1c MFr Bld: 5.3 % (ref 4.8–5.6)
Mean Plasma Glucose: 105.41 mg/dL

## 2017-12-02 LAB — TROPONIN I
Troponin I: <0.03 ng/mL (ref <0.03)
Troponin I: <0.03 ng/mL (ref <0.03)
Troponin I: <0.03 ng/mL (ref <0.03)

## 2017-12-02 LAB — MRSA PCR SCREENING: MRSA by PCR: NEGATIVE

## 2017-12-02 SURGERY — LEFT HEART CATH AND CORONARY ANGIOGRAPHY
Anesthesia: LOCAL

## 2017-12-02 MED ORDER — SODIUM CHLORIDE 0.9% FLUSH
3.0000 mL | Freq: Two times a day (BID) | INTRAVENOUS | Status: DC
  Administered 2017-12-02 – 2017-12-06 (×8): 3 mL via INTRAVENOUS

## 2017-12-02 MED ORDER — FLUTICASONE PROPIONATE 50 MCG/ACT NA SUSP
2.0000 | Freq: Every day | NASAL | Status: DC
  Administered 2017-12-03 – 2017-12-07 (×4): 2 via NASAL
  Filled 2017-12-02 (×2): qty 16

## 2017-12-02 MED ORDER — LIDOCAINE HCL (PF) 1 % IJ SOLN
INTRAMUSCULAR | Status: AC
  Filled 2017-12-02: qty 30

## 2017-12-02 MED ORDER — SODIUM CHLORIDE 0.9% FLUSH: 3.0000 mL | INTRAVENOUS | Status: DC | PRN

## 2017-12-02 MED ORDER — CHOLESTYRAMINE 4 G PO PACK
4.0000 g | PACK | Freq: Every day | ORAL | Status: DC
  Administered 2017-12-02: 4 g via ORAL
  Filled 2017-12-02 (×2): qty 1

## 2017-12-02 MED ORDER — IOPAMIDOL (ISOVUE-370) INJECTION 76%
INTRAVENOUS | Status: DC | PRN
  Administered 2017-12-02: 65 mL via INTRAVENOUS

## 2017-12-02 MED ORDER — ACETAMINOPHEN 325 MG PO TABS: 650.0000 mg | ORAL_TABLET | ORAL | Status: DC | PRN

## 2017-12-02 MED ORDER — ONDANSETRON HCL 4 MG/2ML IJ SOLN: 4.0000 mg | Freq: Four times a day (QID) | INTRAMUSCULAR | Status: DC | PRN

## 2017-12-02 MED ORDER — MIDAZOLAM HCL 2 MG/2ML IJ SOLN
INTRAMUSCULAR | Status: AC
  Filled 2017-12-02: qty 2

## 2017-12-02 MED ORDER — ASPIRIN 81 MG PO CHEW
81.0000 mg | CHEWABLE_TABLET | Freq: Every day | ORAL | Status: DC
  Administered 2017-12-03 – 2017-12-06 (×4): 81 mg via ORAL
  Filled 2017-12-02 (×4): qty 1

## 2017-12-02 MED ORDER — SODIUM CHLORIDE 0.9 % IV SOLN: INTRAVENOUS | Status: AC

## 2017-12-02 MED ORDER — FENTANYL CITRATE (PF) 100 MCG/2ML IJ SOLN
INTRAMUSCULAR | Status: AC
  Filled 2017-12-02: qty 2

## 2017-12-02 MED ORDER — MIDAZOLAM HCL 2 MG/2ML IJ SOLN
INTRAMUSCULAR | Status: DC | PRN
  Administered 2017-12-02: 2 mg via INTRAVENOUS
  Administered 2017-12-02: 1 mg via INTRAVENOUS

## 2017-12-02 MED ORDER — PRAVASTATIN SODIUM 40 MG PO TABS
40.0000 mg | ORAL_TABLET | Freq: Every day | ORAL | Status: DC
  Administered 2017-12-02 – 2017-12-14 (×12): 40 mg via ORAL
  Filled 2017-12-02 (×12): qty 1

## 2017-12-02 MED ORDER — HEPARIN (PORCINE) IN NACL 100-0.45 UNIT/ML-% IJ SOLN
1050.0000 [IU]/h | INTRAMUSCULAR | Status: DC
  Administered 2017-12-02 – 2017-12-06 (×4): 1050 [IU]/h via INTRAVENOUS
  Filled 2017-12-02 (×4): qty 250

## 2017-12-02 MED ORDER — PANTOPRAZOLE SODIUM 40 MG PO TBEC: 40.0000 mg | DELAYED_RELEASE_TABLET | Freq: Every day | ORAL | Status: DC

## 2017-12-02 MED ORDER — HEPARIN (PORCINE) IN NACL 2-0.9 UNITS/ML
INTRAMUSCULAR | Status: AC | PRN
  Administered 2017-12-02: 1000 mL

## 2017-12-02 MED ORDER — LIDOCAINE HCL (PF) 1 % IJ SOLN
INTRAMUSCULAR | Status: DC | PRN
  Administered 2017-12-02: 2 mL

## 2017-12-02 MED ORDER — HEPARIN SODIUM (PORCINE) 1000 UNIT/ML IJ SOLN
INTRAMUSCULAR | Status: AC
  Filled 2017-12-02: qty 1

## 2017-12-02 MED ORDER — SODIUM CHLORIDE 0.9 % IV SOLN
250.0000 mL | INTRAVENOUS | Status: DC | PRN
  Administered 2017-12-06 (×2): 250 mL via INTRAVENOUS
  Administered 2017-12-07: 12:00:00 via INTRAVENOUS

## 2017-12-02 MED ORDER — HEPARIN (PORCINE) IN NACL 1000-0.9 UT/500ML-% IV SOLN
INTRAVENOUS | Status: AC
  Filled 2017-12-02: qty 1000

## 2017-12-02 MED ORDER — IOPAMIDOL (ISOVUE-370) INJECTION 76%
INTRAVENOUS | Status: AC
  Filled 2017-12-02: qty 100

## 2017-12-02 MED ORDER — METOPROLOL SUCCINATE ER 100 MG PO TB24
200.0000 mg | ORAL_TABLET | Freq: Every day | ORAL | Status: DC
  Administered 2017-12-02 – 2017-12-06 (×5): 200 mg via ORAL
  Filled 2017-12-02 (×5): qty 2

## 2017-12-02 MED ORDER — VERAPAMIL HCL 2.5 MG/ML IV SOLN
INTRAVENOUS | Status: AC
  Filled 2017-12-02: qty 2

## 2017-12-02 MED ORDER — VERAPAMIL HCL 2.5 MG/ML IV SOLN
INTRAVENOUS | Status: DC | PRN
  Administered 2017-12-02: 10 mL via INTRA_ARTERIAL

## 2017-12-02 MED ORDER — FENTANYL CITRATE (PF) 100 MCG/2ML IJ SOLN
25.0000 ug | Freq: Once | INTRAMUSCULAR | Status: AC
  Administered 2017-12-02: 25 ug via INTRAVENOUS

## 2017-12-02 MED ORDER — FENTANYL CITRATE (PF) 100 MCG/2ML IJ SOLN
INTRAMUSCULAR | Status: DC | PRN
  Administered 2017-12-02 (×2): 25 ug via INTRAVENOUS

## 2017-12-02 MED ORDER — POTASSIUM CHLORIDE CRYS ER 10 MEQ PO TBCR
10.0000 meq | EXTENDED_RELEASE_TABLET | Freq: Every evening | ORAL | Status: DC
  Administered 2017-12-02 – 2017-12-06 (×5): 10 meq via ORAL
  Filled 2017-12-02 (×5): qty 1

## 2017-12-02 MED ORDER — ISOSORBIDE MONONITRATE ER 30 MG PO TB24: 30.0000 mg | ORAL_TABLET | Freq: Every day | ORAL | Status: DC

## 2017-12-02 MED ORDER — HEPARIN SODIUM (PORCINE) 1000 UNIT/ML IJ SOLN
INTRAMUSCULAR | Status: DC | PRN
  Administered 2017-12-02: 4500 [IU] via INTRAVENOUS

## 2017-12-02 MED ORDER — POTASSIUM CHLORIDE CRYS ER 20 MEQ PO TBCR: 20.0000 meq | EXTENDED_RELEASE_TABLET | Freq: Every evening | ORAL | Status: DC

## 2017-12-02 MED ORDER — NITROGLYCERIN 2 % TD OINT
0.5000 [in_us] | TOPICAL_OINTMENT | Freq: Four times a day (QID) | TRANSDERMAL | Status: DC
  Administered 2017-12-02 – 2017-12-06 (×18): 0.5 [in_us] via TOPICAL
  Filled 2017-12-02: qty 30

## 2017-12-02 MED ORDER — TRAZODONE HCL 50 MG PO TABS
50.0000 mg | ORAL_TABLET | Freq: Once | ORAL | Status: AC
  Administered 2017-12-02: 50 mg via ORAL
  Filled 2017-12-02: qty 1

## 2017-12-02 MED ORDER — PANTOPRAZOLE SODIUM 40 MG PO TBEC
40.0000 mg | DELAYED_RELEASE_TABLET | Freq: Two times a day (BID) | ORAL | Status: DC
  Administered 2017-12-02 – 2017-12-14 (×24): 40 mg via ORAL
  Filled 2017-12-02 (×25): qty 1

## 2017-12-02 SURGICAL SUPPLY — 9 items
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
## 2017-12-02 NOTE — Progress Notes (Signed)
Patient experiencing acute onset SOB, feels like chest is "caving in". Says it does not feel like the episode that brought him in, no significant chest pain but feels uncomfortable. MD/PA paged, EKG performed, 1SL nitro given. VSS BP 129/70 (88), HR 73, O2 97. Spoke with PA about possibility of nitro drip. Will monitor closely.

## 2017-12-02 NOTE — Progress Notes (Signed)
CTSP for recurrent discomfort, somewhat different than presenting symptoms. He reports a sensation that his shoulders are coming out and chest is caving in, but also with some generalized tightness. He also reported feeling SOB. VSS as outlined in nurse note, pulse ox wnl. 1 SL NTG without specific improvement, so 24mcg of fentanyl given which helped resolve chest discomfort and ease dyspnea. Now resting comfortably. Updated Dr. Sallyanne Kuster on events, EKG reviewed and felt nonacute. He would suggest to continue oral beta blocker as ordered and add NTG paste. We will hold the Imdur since he will be getting NTG paste. We considered addition of IV heparin but nursing is still having some issues with radial site oozing, leading to slower deflation of TR band so will hold off. Will continue to monitor closely. Will also resume patient's home PPI and potassium (will change gluconate OTC to KCl).  Dayna Dunn PA-C

## 2017-12-02 NOTE — Progress Notes (Signed)
Patient having slight oozing of blood at radial cath site. 1cc air added to band to control. Will continue to monitor and deflate band as tolerated.

## 2017-12-02 NOTE — Interval H&P Note (Signed)
Cath Lab Visit (complete for each Cath Lab visit)  Clinical Evaluation Leading to the Procedure:   ACS: Yes.    Non-ACS:    Anginal Classification: CCS IV  Anti-ischemic medical therapy: Maximal Therapy (2 or more classes of medications)  Non-Invasive Test Results: Intermediate-risk stress test findings: cardiac mortality 1-3%/year  Prior CABG: No previous CABG      History and Physical Interval Note:  12/02/2017 7:19 AM  Ronald Fear Sr.  has presented today for surgery, with the diagnosis of as  The various methods of treatment have been discussed with the patient and family. After consideration of risks, benefits and other options for treatment, the patient has consented to  Procedure(s): LEFT HEART CATH AND CORONARY ANGIOGRAPHY (N/A) as a surgical intervention .  The patient's history has been reviewed, patient examined, no change in status, stable for surgery.  I have reviewed the patient's chart and labs.  Questions were answered to the patient's satisfaction.     Larae Grooms
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
## 2017-12-02 NOTE — Progress Notes (Signed)
ANTICOAGULATION CONSULT NOTE - Initial Consult  Pharmacy Consult for heparin Indication: ACS  Patient Measurements: Height: 6' (182.9 cm) Weight: 202 lb 2.6 oz (91.7 kg) IBW/kg (Calculated) : 77.6 Heparin Dosing Weight: 91 kg  Vital Signs: Temp: 97.8 F (36.6 C) (06/21 1530) Temp Source: Oral (06/21 1530) BP: 133/74 (06/21 1530) Pulse Rate: 62 (06/21 1530)  Labs: Recent Labs    12/01/17 1852 12/02/17 0053 12/02/17 0422 12/02/17 0906 12/02/17 1527  HGB 13.7  --   --   --   --   HCT 42.0  --   --   --   --   PLT 135*  --   --   --   --   LABPROT  --  13.9  --   --   --   INR  --  1.08  --   --   --   CREATININE 1.42*  --  1.33*  --   --   TROPONINI  --  <0.03  --  <0.03 <0.03   Medical History: Past Medical History:  Diagnosis Date  . Allergy   . Anxiety   . Bladder cancer ([place]) 2010   bladder  . Cataract   . Chronic bronchitis ([place])    "yearly the last 3 yrs" (02/13/2013)  . Clotting disorder ([place])   . Exertional shortness of breath    "the last 6 months" (02/13/2013)  . GERD (gastroesophageal reflux disease)   . History of blood transfusion 1949   "seeral" (02/13/2013)  . HOH (hard of hearing)   . Hypertension   . Ocular migraine    "not often" (02/13/2013)  . Pneumonia    "last time ~ 2 yr ago; had it before that too" (02/13/2013)  . PONV (postoperative nausea and vomiting)   . Seasonal allergies   . Shingles    has neuropathy since it happened in 6/17  . Temporal arteritis [facility])      Assessment: 83 yo male now s/p cath which showed 2 vessel disease.  Planning to resume heparin as bridge to CABG. Planning CABG on 6/26.   Goal of Therapy:  Heparin level 0.3-0.7 units/ml Monitor platelets by anticoagulation protocol: Yes    Plan: -Resume heparin at 1050 units/hr -Daily HL, CBC -First level in the morning     Harvel Quale 12/02/2017,6:10 PM

## 2017-12-03 DIAGNOSIS — I2510 Atherosclerotic heart disease of native coronary artery without angina pectoris: Secondary | ICD-10-CM

## 2017-12-03 LAB — BASIC METABOLIC PANEL
Anion gap: 8 (ref 5–15)
BUN: 12 mg/dL (ref 6–20)
CO2: 24 mmol/L (ref 22–32)
Calcium: 8.5 mg/dL — ABNORMAL LOW (ref 8.9–10.3)
Chloride: 109 mmol/L (ref 101–111)
Creatinine, Ser: 1.25 mg/dL — ABNORMAL HIGH (ref 0.61–1.24)
GFR calc Af Amer: >60 mL/min (ref >60)
GFR calc non Af Amer: 52 mL/min — ABNORMAL LOW (ref >60)
Glucose, Bld: 97 mg/dL (ref 65–99)
Potassium: 3.9 mmol/L (ref 3.5–5.1)
Sodium: 141 mmol/L (ref 135–145)

## 2017-12-03 LAB — CBC
HCT: 42 % (ref 39.0–52.0)
Hemoglobin: 14 g/dL (ref 13.0–17.0)
MCH: 30.7 pg (ref 26.0–34.0)
MCHC: 33.3 g/dL (ref 30.0–36.0)
MCV: 92.1 fL (ref 78.0–100.0)
Platelets: 127 10*3/uL — ABNORMAL LOW (ref 150–400)
RBC: 4.56 MIL/uL (ref 4.22–5.81)
RDW: 14.2 % (ref 11.5–15.5)
WBC: 6.9 10*3/uL (ref 4.0–10.5)

## 2017-12-03 LAB — HEPARIN LEVEL (UNFRACTIONATED)
Heparin Unfractionated: 0.3 IU/mL (ref 0.30–0.70)
Heparin Unfractionated: 0.43 IU/mL (ref 0.30–0.70)

## 2017-12-03 MED ORDER — TRAZODONE HCL 50 MG PO TABS
50.0000 mg | ORAL_TABLET | Freq: Once | ORAL | Status: AC
  Administered 2017-12-03: 50 mg via ORAL
  Filled 2017-12-03: qty 1

## 2017-12-03 MED ORDER — CHOLESTYRAMINE 4 G PO PACK
4.0000 g | PACK | Freq: Every day | ORAL | Status: DC
  Administered 2017-12-03 – 2017-12-04 (×2): 4 g via ORAL
  Filled 2017-12-03 (×2): qty 1

## 2017-12-03 NOTE — Progress Notes (Signed)
PROGRESS NOTE    Ronald Russell  BJY:[id number] DOB: [birth year] DOA: 12/01/2017 PCP: Claretta Fraise, MD    Brief Narrative:  83 y.o. male with medical history significant of hypertension, GERD, with recent abnormal stress testing done by cardiology department for which he was told was abnormal and is scheduled for heart catheterization in the morning.  Patient was driving today when he had substernal chest pain that felt like dull pressure that was persisted for hours.  The pain did not radiate anywhere and it was resolved in the ED with nitroglycerin.  Patient denies any cough or fevers.  He denies any nausea vomiting or diarrhea.  He denies any lower extremity edema or swelling.  Patient is currently chest pain-free.  Cardiology service was called who are requesting medicine to admit for him to get his heart catheterization performed.  Assessment & Plan:   Principal Problem:   Unstable angina (HCC) Active Problems:   Hypertension   GAD (generalized anxiety disorder)   HLD (hyperlipidemia)  Unstable angina ([place]) -Patient had been continued on IV heparin.  -Recommendation for Imdur by Cardiology. Ordered -Have resumed patient's metoprolol -Cardiology following s/p cath with findings of two vessel disease not amenable to stent placement -CT is now following, recommendation for CABG with tentative plan for 6/26 -No chest pain this morning  Hypertension -resume home meds per home regimen -bp stable at present -resumed home metoprolol  GAD (generalized anxiety disorder) -remains stable at this time  HLD (hyperlipidemia) -continue statin per home regimen   DVT prophylaxis: SCD Code Status: Full Family Communication: Pt in room, family at bedside Disposition Plan: Uncertain. Planning CABG 6/26  Consultants:   Cardiology  CTS  Procedures:   Heart cath 6/21  Antimicrobials: Anti-infectives (From admission, onward)   None      Subjective: No chest pain or  sob  Objective: Vitals:   12/02/17 2300 12/03/17 0300 12/03/17 0302 12/03/17 0700  BP: 135/74 (!) 120/59 (!) 120/59 124/69  Pulse: 61 (!) 58 63 (!) 55  Resp: 17 18 18  (!) 22  Temp:   97.8 F (36.6 C) 98 F (36.7 C)  TempSrc:   Oral Oral  SpO2: 97% 96% 97% 96%  Weight:      Height:        Intake/Output Summary (Last 24 hours) at 12/03/2017 0845 Last data filed at 12/03/2017 0400 Gross per 24 hour  Intake 1399.21 ml  Output 1425 ml  Net -25.79 ml   Filed Weights   12/01/17 1852 12/01/17 2018 12/01/17 2346  Weight: 93 kg (205 lb) 88.5 kg (195 lb) 91.7 kg (202 lb 2.6 oz)    Examination: General exam: Conversant, in no acute distress Respiratory system: normal chest rise, clear, no audible wheezing Cardiovascular system: regular rhythm, s1-s2  Data Reviewed: I have personally reviewed following labs and imaging studies  CBC: Recent Labs  Lab 12/01/17 1852 12/03/17 0256  WBC 5.9 6.9  HGB 13.7 14.0  HCT 42.0 42.0  MCV 94.2 92.1  PLT 135* 086*   Basic Metabolic Panel: Recent Labs  Lab 12/01/17 1852 12/02/17 0422 12/03/17 0256  NA 140 140 141  K 3.8 3.7 3.9  CL 109 110 109  CO2 22 24 24   GLUCOSE 88 85 97  BUN 15 14 12   CREATININE 1.42* 1.33* 1.25*  CALCIUM 8.5* 8.2* 8.5*   GFR: Estimated Creatinine Clearance: 50.9 mL/min (A) (by C-G formula based on SCr of 1.25 mg/dL (H)). Liver Function Tests: No results for input(s): AST,  ALT, ALKPHOS, BILITOT, PROT, ALBUMIN in the last 168 hours. No results for input(s): LIPASE, AMYLASE in the last 168 hours. No results for input(s): AMMONIA in the last 168 hours. Coagulation Profile: Recent Labs  Lab 12/02/17 0053  INR 1.08   Cardiac Enzymes: Recent Labs  Lab 12/02/17 0053 12/02/17 0906 12/02/17 1527  TROPONINI <0.03 <0.03 <0.03   BNP (last 3 results) No results for input(s): PROBNP in the last 8760 hours. HbA1C: Recent Labs    12/02/17 0053  HGBA1C 5.3   CBG: No results for input(s): GLUCAP in the  last 168 hours. Lipid Profile: Recent Labs    12/02/17 0053  CHOL 119  HDL 36*  LDLCALC 55  TRIG 140  CHOLHDL 3.3   Thyroid Function Tests: No results for input(s): TSH, T4TOTAL, FREET4, T3FREE, THYROIDAB in the last 72 hours. Anemia Panel: No results for input(s): VITAMINB12, FOLATE, FERRITIN, TIBC, IRON, RETICCTPCT in the last 72 hours. Sepsis Labs: No results for input(s): PROCALCITON, LATICACIDVEN in the last 168 hours.  Recent Results (from the past 240 hour(s))  MRSA PCR Screening     Status: None   Collection Time: 12/02/17 12:17 AM  Result Value Ref Range Status   MRSA by PCR NEGATIVE NEGATIVE Final    Comment:        The GeneXpert MRSA Assay (FDA approved for NASAL specimens only), is one component of a comprehensive MRSA colonization surveillance program. It is not intended to diagnose MRSA infection nor to guide or monitor treatment for MRSA infections. Performed at [facility], [place] [address], [place],  [address]      Radiology Studies: Dg Chest 2 View  Result Date: 12/01/2017 CLINICAL DATA:  CP on and off, worse today. Abnormal EKG reading at Urgent Care today. Pt said he is scheduled to have a cardiac catheterization tomorrow morning. Hx of hypertension, chronic bronchitis, pneumonia. Former [phone]). EXAM: CHEST - 2 VIEW COMPARISON:  12/28/2016 FINDINGS: Shallow inflation. Heart size is normal. Aorta is tortuous and partially calcified. There are no focal consolidations or pleural effusions. No pulmonary edema. Surgical clips are present in the RIGHT UPPER QUADRANT the abdomen. Small hiatal hernia stable. IMPRESSION: No active cardiopulmonary disease. Electronically Signed   By: Nolon Nations M.D.   On: 12/01/2017 20:00    Scheduled Meds: . aspirin  81 mg Oral Daily  . cholestyramine  4 g Oral QPC lunch  . fluticasone  2 spray Each Nare QHS  . metoprolol  200 mg Oral Daily  . nitroGLYCERIN  0.5 inch Topical Q6H  . pantoprazole  40  mg Oral BID  . potassium chloride  10 mEq Oral QPM  . pravastatin  40 mg Oral Daily  . sodium chloride flush  3 mL Intravenous Q12H   Continuous Infusions: . sodium chloride    . heparin 1,050 Units/hr (12/03/17 0400)     LOS: 1 day   Marylu Lund, MD [facility] Pager [phone]  If 7PM-7AM, please contact night-coverage [email or web address] Password [facility] 12/03/2017, 8:45 AM

## 2017-12-03 NOTE — Plan of Care (Signed)
Continue with plan of care.  

## 2017-12-03 NOTE — Progress Notes (Signed)
[place] for Heparin Indication: ACS, s/p cath, awaiting CABG  Patient Measurements: Height: 6' (182.9 cm) Weight: 202 lb 2.6 oz (91.7 kg) IBW/kg (Calculated) : 77.6 Heparin Dosing Weight: 91 kg  Vital Signs: Temp: 97.8 F (36.6 C) (06/22 0302) Temp Source: Oral (06/22 0302) BP: 120/59 (06/22 0302) Pulse Rate: 63 (06/22 0302)  Labs: Recent Labs    12/01/17 1852 12/02/17 0053 12/02/17 0422 12/02/17 0906 12/02/17 1527 12/03/17 0256  HGB 13.7  --   --   --   --  14.0  HCT 42.0  --   --   --   --  42.0  PLT 135*  --   --   --   --  127*  LABPROT  --  13.9  --   --   --   --   INR  --  1.08  --   --   --   --   HEPARINUNFRC  --   --   --   --   --  0.30  CREATININE 1.42*  --  1.33*  --   --  1.25*  TROPONINI  --  <0.03  --  <0.03 <0.03  --    Medical History: Past Medical History:  Diagnosis Date  . Allergy   . Anxiety   . Bladder cancer ([place]) 2010   bladder  . Cataract   . Chronic bronchitis ([place])    "yearly the last 3 yrs" (02/13/2013)  . Clotting disorder ([place])   . Exertional shortness of breath    "the last 6 months" (02/13/2013)  . GERD (gastroesophageal reflux disease)   . History of blood transfusion 1949   "seeral" (02/13/2013)  . HOH (hard of hearing)   . Hypertension   . Ocular migraine    "not often" (02/13/2013)  . Pneumonia    "last time ~ 2 yr ago; had it before that too" (02/13/2013)  . PONV (postoperative nausea and vomiting)   . Seasonal allergies   . Shingles    has neuropathy since it happened in 6/17  . Temporal arteritis [facility])      Assessment: 83 yo male now s/p cath which showed 2 vessel disease.  Planning to resume heparin as bridge to CABG. Planning CABG on 6/26.  6/22 AM update: heparin level therapeutic x 1 after re-start s/p cath  Goal of Therapy:  Heparin level 0.3-0.7 units/ml Monitor platelets by anticoagulation protocol: Yes   Plan: Cont heparin at 1050 units/hr 1200 HL Tentative  CABG 6/26   Narda Bonds, PharmD, [place] Clinical Pharmacist Phone: [phone]

## 2017-12-03 NOTE — Progress Notes (Signed)
[place] for Heparin Indication: ACS, s/p cath, awaiting CABG  Patient Measurements: Height: 6' (182.9 cm) Weight: 202 lb 2.6 oz (91.7 kg) IBW/kg (Calculated) : 77.6 Heparin Dosing Weight: 91 kg  Vital Signs: Temp: 97.6 F (36.4 C) (06/22 1141) Temp Source: Oral (06/22 1141) BP: 116/73 (06/22 1141) Pulse Rate: 55 (06/22 1141)  Labs: Recent Labs    12/01/17 1852 12/02/17 0053 12/02/17 0422 12/02/17 0906 12/02/17 1527 12/03/17 0256 12/03/17 1152  HGB 13.7  --   --   --   --  14.0  --   HCT 42.0  --   --   --   --  42.0  --   PLT 135*  --   --   --   --  127*  --   LABPROT  --  13.9  --   --   --   --   --   INR  --  1.08  --   --   --   --   --   HEPARINUNFRC  --   --   --   --   --  0.30 0.43  CREATININE 1.42*  --  1.33*  --   --  1.25*  --   TROPONINI  --  <0.03  --  <0.03 <0.03  --   --    Medical History: Past Medical History:  Diagnosis Date  . Allergy   . Anxiety   . Bladder cancer ([place]) 2010   bladder  . Cataract   . Chronic bronchitis ([place])    "yearly the last 3 yrs" (02/13/2013)  . Clotting disorder ([place])   . Exertional shortness of breath    "the last 6 months" (02/13/2013)  . GERD (gastroesophageal reflux disease)   . History of blood transfusion 1949   "seeral" (02/13/2013)  . HOH (hard of hearing)   . Hypertension   . Ocular migraine    "not often" (02/13/2013)  . Pneumonia    "last time ~ 2 yr ago; had it before that too" (02/13/2013)  . PONV (postoperative nausea and vomiting)   . Seasonal allergies   . Shingles    has neuropathy since it happened in 6/17  . Temporal arteritis [facility])      Assessment: 83 yo male now s/p cath which showed 2 vessel disease.  Planning to resume heparin as bridge to CABG. Planning CABG on 6/26.  Heparin level therapeutic  Goal of Therapy:  Heparin level 0.3-0.7 units/ml Monitor platelets by anticoagulation protocol: Yes   Plan: Cont heparin at 1050 units/hr  Tentative  CABG 6/26   Thank you Anette Guarneri, PharmD [phone]

## 2017-12-03 NOTE — Progress Notes (Signed)
Progress Note  Patient Name: Ronald BAIL Sr. Date of Encounter: 12/03/2017  Primary Cardiologist: No primary care provider on file.   Subjective   Had repeated angina at rest, that resolved after NTG paste and IV heparin.  Inpatient Medications    Scheduled Meds: . aspirin  81 mg Oral Daily  . cholestyramine  4 g Oral QPC breakfast  . fluticasone  2 spray Each Nare QHS  . metoprolol  200 mg Oral Daily  . nitroGLYCERIN  0.5 inch Topical Q6H  . pantoprazole  40 mg Oral BID  . potassium chloride  10 mEq Oral QPM  . pravastatin  40 mg Oral Daily  . sodium chloride flush  3 mL Intravenous Q12H   Continuous Infusions: . sodium chloride    . heparin 1,050 Units/hr (12/03/17 0400)   PRN Meds: sodium chloride, acetaminophen, nitroGLYCERIN, ondansetron (ZOFRAN) IV, sodium chloride flush   Vital Signs    Vitals:   12/02/17 2300 12/03/17 0300 12/03/17 0302 12/03/17 0700  BP: 135/74 (!) 120/59 (!) 120/59 124/69  Pulse: 61 (!) 58 63 (!) 55  Resp: 17 18 18  (!) 22  Temp:   97.8 F (36.6 C) 98 F (36.7 C)  TempSrc:   Oral Oral  SpO2: 97% 96% 97% 96%  Weight:      Height:        Intake/Output Summary (Last 24 hours) at 12/03/2017 1015 Last data filed at 12/03/2017 0900 Gross per 24 hour  Intake 1519.21 ml  Output 1425 ml  Net 94.21 ml   Filed Weights   12/01/17 1852 12/01/17 2018 12/01/17 2346  Weight: 205 lb (93 kg) 195 lb (88.5 kg) 202 lb 2.6 oz (91.7 kg)    Telemetry    NSR - Personally Reviewed  ECG    No new tracing - Personally Reviewed  Physical Exam  Comfortable at rest GEN: No acute distress.   Neck: No JVD Cardiac: RRR, no murmurs, rubs, or gallops.  Respiratory: Clear to auscultation bilaterally. GI: Soft, nontender, non-distended  MS: No edema; No deformity. Neuro:  Nonfocal  Psych: Normal affect   Labs    Chemistry Recent Labs  Lab 12/01/17 1852 12/02/17 0422 12/03/17 0256  NA 140 140 141  K 3.8 3.7 3.9  CL 109 110 109  CO2 22 24  24   GLUCOSE 88 85 97  BUN 15 14 12   CREATININE 1.42* 1.33* 1.25*  CALCIUM 8.5* 8.2* 8.5*  GFRNONAA 45* 48* 52*  GFRAA 52* 56* >60  ANIONGAP 9 6 8      Hematology Recent Labs  Lab 12/01/17 1852 12/03/17 0256  WBC 5.9 6.9  RBC 4.46 4.56  HGB 13.7 14.0  HCT 42.0 42.0  MCV 94.2 92.1  MCH 30.7 30.7  MCHC 32.6 33.3  RDW 14.3 14.2  PLT 135* 127*    Cardiac Enzymes Recent Labs  Lab 12/02/17 0053 12/02/17 0906 12/02/17 1527  TROPONINI <0.03 <0.03 <0.03    Recent Labs  Lab 12/01/17 1902  TROPIPOC 0.00     BNPNo results for input(s): BNP, PROBNP in the last 168 hours.   DDimer No results for input(s): DDIMER in the last 168 hours.   Radiology    Dg Chest 2 View  Result Date: 12/01/2017 CLINICAL DATA:  CP on and off, worse today. Abnormal EKG reading at Urgent Care today. Pt said he is scheduled to have a cardiac catheterization tomorrow morning. Hx of hypertension, chronic bronchitis, pneumonia. Former [phone]). EXAM: CHEST - 2 VIEW COMPARISON:  12/28/2016 FINDINGS: Shallow inflation. Heart size is normal. Aorta is tortuous and partially calcified. There are no focal consolidations or pleural effusions. No pulmonary edema. Surgical clips are present in the RIGHT UPPER QUADRANT the abdomen. Small hiatal hernia stable. IMPRESSION: No active cardiopulmonary disease. Electronically Signed   By: Nolon Nations M.D.   On: 12/01/2017 20:00    Cardiac Studies   11/24/2017 nuclear stress test: There is a medium defect of mild severity present in the apical anterior, apical septal and apex location.Findings consistent with ischemia. EF over 65%.  Cath December 02, 2017 Conclusion     Prox Cx lesion is 80% stenosed.  Ost 2nd Diag to 2nd Diag lesion is 75% stenosed.  Mid LAD lesion is 100% stenosed.  Mid Cx to Dist Cx lesion is 70% stenosed.  Prox LAD lesion is 75% stenosed.  The left ventricular systolic function is normal.  The left ventricular ejection  fraction is 55-65% by visual estimate.  LV end diastolic pressure is mildly elevated.  Severe calcific 2 vessel CAD including LAD CTO, severe diagonal disease and severe disease in the mid and distal circumflex.  Faint collaterals to the distal LAD. Will obtain cardiac surgery consult.  Significant right subclavian tortuosity noted.   Echo September 01, 2016 - Left ventricle: The cavity size was normal. Wall thickness was normal. Systolic function was normal. The estimated ejection fraction was in the range of 60% to 65%. Wall motion was normal; there were no regional wall motion abnormalities. Features are consistent with a pseudonormal left ventricular filling pattern, with concomitant abnormal relaxation and increased filling pressure (grade 2 diastolic dysfunction). - Aortic valve: There was no stenosis. - Mitral valve: There was no significant regurgitation. - Right ventricle: The cavity size was normal. Systolic function was normal. - Pulmonary arteries: No complete TR doppler jet so unable to estimate PA systolic pressure. - Systemic veins: IVC was not visualized.  Impressions:  - Normal LV size with EF 60-65%. Normal RV size and systolic function. No significant valvular abnormalities.  Patient Profile     83 y.o. male with unstable angina and severe 2 vessel CAD, HTN, HLP, planning for CABG next week.  Assessment & Plan    1. Unstable angina: Had prolonged symptoms at rest yesterday, without evidence of new high risk ECG changes or cardiac enzyme spill.  Coronary anatomy is not amenable to percutaneous revascularization and he is best suited for bypass surgery.  Echo March 2018 showed normal left ventricular systolic function and no significant valvular abnormalities. Symptoms subsided with IV heparin and topical nitrates. CABG tentatively Wednesday. 2. HTN: Excellent control 3. HLP: On statin, LDL well within target range, slightly low  HDL.     For questions or updates, please contact [place] Please consult [email or web address] for contact info under Cardiology/STEMI.      Signed, Sanda Klein, MD  12/03/2017, 10:15 AM

## 2017-12-04 ENCOUNTER — Inpatient Hospital Stay (HOSPITAL_COMMUNITY): Payer: PPO

## 2017-12-04 DIAGNOSIS — Z01810 Encounter for preprocedural cardiovascular examination: Secondary | ICD-10-CM

## 2017-12-04 LAB — CBC
HCT: 41.1 % (ref 39.0–52.0)
Hemoglobin: 13.7 g/dL (ref 13.0–17.0)
MCH: 30.8 pg (ref 26.0–34.0)
MCHC: 33.3 g/dL (ref 30.0–36.0)
MCV: 92.4 fL (ref 78.0–100.0)
Platelets: 114 10*3/uL — ABNORMAL LOW (ref 150–400)
RBC: 4.45 MIL/uL (ref 4.22–5.81)
RDW: 14 % (ref 11.5–15.5)
WBC: 6.4 10*3/uL (ref 4.0–10.5)

## 2017-12-04 LAB — HEPARIN LEVEL (UNFRACTIONATED): Heparin Unfractionated: 0.54 IU/mL (ref 0.30–0.70)

## 2017-12-04 MED ORDER — ONDANSETRON HCL 4 MG/2ML IJ SOLN: 4.0000 mg | Freq: Four times a day (QID) | INTRAMUSCULAR | Status: DC | PRN

## 2017-12-04 MED ORDER — ZOLPIDEM TARTRATE 5 MG PO TABS
5.0000 mg | ORAL_TABLET | Freq: Once | ORAL | Status: AC
  Administered 2017-12-04: 5 mg via ORAL
  Filled 2017-12-04: qty 1

## 2017-12-04 MED ORDER — CHOLESTYRAMINE 4 G PO PACK
4.0000 g | PACK | Freq: Every day | ORAL | Status: DC
  Administered 2017-12-05 – 2017-12-08 (×3): 4 g via ORAL
  Filled 2017-12-04 (×4): qty 1

## 2017-12-04 NOTE — Progress Notes (Signed)
Pre-op Cardiac Surgery  Carotid Findings:  1-39% ICA plaquing. Vertebral artery flow is antegrade.   Upper Extremity Right Left  Brachial Pressures 106T 117T  Radial Waveforms T T  Ulnar Waveforms T T  Palmar Arch (Allen's Test) WNL WNL   Findings:      Lower  Extremity Right Left  Dorsalis Pedis T T  Anterior Tibial    Posterior Tibial T T  Ankle/Brachial Indices

## 2017-12-04 NOTE — Plan of Care (Signed)
Continue with plan of care.  

## 2017-12-04 NOTE — Progress Notes (Signed)
Progress Note  Patient Name: Ronald BISCHOFF Sr. Date of Encounter: 12/04/2017  Primary Cardiologist: Harrington Challenger  Subjective   No angina (none since IV heparin started).  Inpatient Medications    Scheduled Meds: . aspirin  81 mg Oral Daily  . cholestyramine  4 g Oral QPC breakfast  . fluticasone  2 spray Each Nare QHS  . metoprolol  200 mg Oral Daily  . nitroGLYCERIN  0.5 inch Topical Q6H  . pantoprazole  40 mg Oral BID  . potassium chloride  10 mEq Oral QPM  . pravastatin  40 mg Oral Daily  . sodium chloride flush  3 mL Intravenous Q12H   Continuous Infusions: . sodium chloride    . heparin 1,050 Units/hr (12/03/17 2300)   PRN Meds: sodium chloride, acetaminophen, nitroGLYCERIN, ondansetron (ZOFRAN) IV, sodium chloride flush   Vital Signs    Vitals:   12/03/17 2300 12/03/17 2350 12/04/17 0536 12/04/17 0743  BP:  132/65 125/71 108/67  Pulse:  61 61 62  Resp: 17 16 18 15   Temp:  97.6 F (36.4 C) 97.7 F (36.5 C) 97.8 F (36.6 C)  TempSrc:  Oral Oral Oral  SpO2:  97% 97% 96%  Weight:   197 lb 1.6 oz (89.4 kg)   Height:        Intake/Output Summary (Last 24 hours) at 12/04/2017 0947 Last data filed at 12/04/2017 0743 Gross per 24 hour  Intake 999.96 ml  Output 750 ml  Net 249.96 ml   Filed Weights   12/01/17 2018 12/01/17 2346 12/04/17 0536  Weight: 195 lb (88.5 kg) 202 lb 2.6 oz (91.7 kg) 197 lb 1.6 oz (89.4 kg)    Telemetry    NSR - Personally Reviewed  ECG    No new tracing - Personally Reviewed  Physical Exam  Appears well. GEN: No acute distress.   Neck: No JVD Cardiac: RRR, no murmurs, rubs, or gallops.  Respiratory: Clear to auscultation bilaterally. GI: Soft, nontender, non-distended  MS: No edema; No deformity. Neuro:  Nonfocal  Psych: Normal affect   Labs    Chemistry Recent Labs  Lab 12/01/17 1852 12/02/17 0422 12/03/17 0256  NA 140 140 141  K 3.8 3.7 3.9  CL 109 110 109  CO2 22 24 24   GLUCOSE 88 85 97  BUN 15 14 12     CREATININE 1.42* 1.33* 1.25*  CALCIUM 8.5* 8.2* 8.5*  GFRNONAA 45* 48* 52*  GFRAA 52* 56* >60  ANIONGAP 9 6 8      Hematology Recent Labs  Lab 12/01/17 1852 12/03/17 0256 12/04/17 0301  WBC 5.9 6.9 6.4  RBC 4.46 4.56 4.45  HGB 13.7 14.0 13.7  HCT 42.0 42.0 41.1  MCV 94.2 92.1 92.4  MCH 30.7 30.7 30.8  MCHC 32.6 33.3 33.3  RDW 14.3 14.2 14.0  PLT 135* 127* 114*    Cardiac Enzymes Recent Labs  Lab 12/02/17 0053 12/02/17 0906 12/02/17 1527  TROPONINI <0.03 <0.03 <0.03    Recent Labs  Lab 12/01/17 1902  TROPIPOC 0.00     BNPNo results for input(s): BNP, PROBNP in the last 168 hours.   DDimer No results for input(s): DDIMER in the last 168 hours.   Radiology    No results found.  Cardiac Studies   11/24/2017 nuclear stress test: There is a medium defect of mild severity present in the apical anterior, apical septal and apex location.Findings consistent with ischemia. EF over 65%.  Cath December 02, 2017 Conclusion     Prox  Cx lesion is 80% stenosed.  Ost 2nd Diag to 2nd Diag lesion is 75% stenosed.  Mid LAD lesion is 100% stenosed.  Mid Cx to Dist Cx lesion is 70% stenosed.  Prox LAD lesion is 75% stenosed.  The left ventricular systolic function is normal.  The left ventricular ejection fraction is 55-65% by visual estimate.  LV end diastolic pressure is mildly elevated.  Severe calcific 2 vessel CAD including LAD CTO, severe diagonal disease and severe disease in the mid and distal circumflex.  Faint collaterals to the distal LAD. Will obtain cardiac surgery consult.  Significant right subclavian tortuosity noted.   Echo September 01, 2016 - Left ventricle: The cavity size was normal. Wall thickness was normal. Systolic function was normal. The estimated ejection fraction was in the range of 60% to 65%. Wall motion was normal; there were no regional wall motion abnormalities. Features are consistent with a pseudonormal left  ventricular filling pattern, with concomitant abnormal relaxation and increased filling pressure (grade 2 diastolic dysfunction). - Aortic valve: There was no stenosis. - Mitral valve: There was no significant regurgitation. - Right ventricle: The cavity size was normal. Systolic function was normal. - Pulmonary arteries: No complete TR doppler jet so unable to estimate PA systolic pressure. - Systemic veins: IVC was not visualized.  Impressions:  - Normal LV size with EF 60-65%. Normal RV size and systolic function. No significant valvular abnormalities.  Patient Profile     83 y.o. male with unstable angina and severe calcific 2 vessel CAD including LAD CTO  Assessment & Plan    1. Unstable angina: for CABG first available (currently Wednesday), Dr. Roxan Hockey. Nitrate and heparin dependent for angina relief. 2. HTN: Excellent control 3. HLP: On statin, LDL well within target range, slightly low HDL.   For questions or updates, please contact [place] Please consult [email or web address] for contact info under Cardiology/STEMI.      Signed, Sanda Klein, MD  12/04/2017, 9:47 AM

## 2017-12-04 NOTE — Progress Notes (Signed)
[place] for Heparin Indication: ACS, s/p cath, awaiting CABG  Patient Measurements: Height: 6' (182.9 cm) Weight: 197 lb 1.6 oz (89.4 kg) IBW/kg (Calculated) : 77.6 Heparin Dosing Weight: 91 kg  Vital Signs: Temp: 97.9 F (36.6 C) (06/23 1137) Temp Source: Oral (06/23 1137) BP: 104/69 (06/23 1137) Pulse Rate: 55 (06/23 1137)  Labs: Recent Labs    12/01/17 1852 12/02/17 0053 12/02/17 0422 12/02/17 0906 12/02/17 1527 12/03/17 0256 12/03/17 1152 12/04/17 0301  HGB 13.7  --   --   --   --  14.0  --  13.7  HCT 42.0  --   --   --   --  42.0  --  41.1  PLT 135*  --   --   --   --  127*  --  114*  LABPROT  --  13.9  --   --   --   --   --   --   INR  --  1.08  --   --   --   --   --   --   HEPARINUNFRC  --   --   --   --   --  0.30 0.43 0.54  CREATININE 1.42*  --  1.33*  --   --  1.25*  --   --   TROPONINI  --  <0.03  --  <0.03 <0.03  --   --   --    Medical History: Past Medical History:  Diagnosis Date  . Allergy   . Anxiety   . Bladder cancer ([place]) 2010   bladder  . Cataract   . Chronic bronchitis ([place])    "yearly the last 3 yrs" (02/13/2013)  . Clotting disorder ([place])   . Exertional shortness of breath    "the last 6 months" (02/13/2013)  . GERD (gastroesophageal reflux disease)   . History of blood transfusion 1949   "seeral" (02/13/2013)  . HOH (hard of hearing)   . Hypertension   . Ocular migraine    "not often" (02/13/2013)  . Pneumonia    "last time ~ 2 yr ago; had it before that too" (02/13/2013)  . PONV (postoperative nausea and vomiting)   . Seasonal allergies   . Shingles    has neuropathy since it happened in 6/17  . Temporal arteritis [facility])      Assessment: 83 yo male now s/p cath which showed 2 vessel disease.  Planning to resume heparin as bridge to CABG. Planning CABG on 6/26.  Heparin level therapeutic today.  CBC stable.  No overt bleeding or complications noted.  Goal of Therapy:  Heparin level  0.3-0.7 units/ml Monitor platelets by anticoagulation protocol: Yes   Plan: Cont heparin at 1050 units/hr Daily heparin level and CBC.  Tentative CABG 6/26   Marguerite Olea, [facility] Clinical Pharmacist Phone [phone]  12/04/2017 12:21 PM

## 2017-12-04 NOTE — Progress Notes (Signed)
PROGRESS NOTE    Ronald Russell  JSE:[id number] DOB: 03-Sep-1934 DOA: 12/01/2017 PCP: Claretta Fraise, MD    Brief Narrative:  83 y.o. male with medical history significant of hypertension, GERD, with recent abnormal stress testing done by cardiology department for which he was told was abnormal and is scheduled for heart catheterization in the morning.  Patient was driving today when he had substernal chest pain that felt like dull pressure that was persisted for hours.  The pain did not radiate anywhere and it was resolved in the ED with nitroglycerin.  Patient denies any cough or fevers.  He denies any nausea vomiting or diarrhea.  He denies any lower extremity edema or swelling.  Patient is currently chest pain-free.  Cardiology service was called who are requesting medicine to admit for him to get his heart catheterization performed.  Assessment & Plan:   Principal Problem:   Unstable angina (HCC) Active Problems:   Hypertension   GAD (generalized anxiety disorder)   HLD (hyperlipidemia)   Coronary artery disease involving native coronary artery of native heart with unstable angina pectoris ([place])  Unstable angina ([place]) -Patient had been continued on IV heparin.  -Recommendation for Imdur by Cardiology. Ordered -Have resumed patient's metoprolol -Cardiology following s/p cath with findings of two vessel disease not amenable to stent placement -CT is now following, recommendation for CABG with tentative plan for 6/26 -No chest pain at this time  Hypertension -resume home meds per home regimen -bp stable at present -resumed home metoprolol  GAD (generalized anxiety disorder) -remains stable at this time  HLD (hyperlipidemia) -continue statin per home regimen   DVT prophylaxis: SCD Code Status: Full Family Communication: Pt in room, family at bedside Disposition Plan: Uncertain. Planning CABG 6/26  Consultants:   Cardiology  CTS  Procedures:   Heart cath  6/21  Antimicrobials: Anti-infectives (From admission, onward)   None      Subjective: Without chest pain or SOB  Objective: Vitals:   12/03/17 2350 12/04/17 0536 12/04/17 0743 12/04/17 1137  BP: 132/65 125/71 108/67 104/69  Pulse: 61 61 62 (!) 55  Resp: 16 18 15 17   Temp: 97.6 F (36.4 C) 97.7 F (36.5 C) 97.8 F (36.6 C) 97.9 F (36.6 C)  TempSrc: Oral Oral Oral Oral  SpO2: 97% 97% 96% 96%  Weight:  89.4 kg (197 lb 1.6 oz)    Height:        Intake/Output Summary (Last 24 hours) at 12/04/2017 1357 Last data filed at 12/04/2017 0900 Gross per 24 hour  Intake 957.98 ml  Output 750 ml  Net 207.98 ml   Filed Weights   12/01/17 2018 12/01/17 2346 12/04/17 0536  Weight: 88.5 kg (195 lb) 91.7 kg (202 lb 2.6 oz) 89.4 kg (197 lb 1.6 oz)    Examination: General exam: Awake, laying in bed, in nad Respiratory system: Normal respiratory effort, no wheezing Cardiovascular system: regular rate, s1, s2  Data Reviewed: I have personally reviewed following labs and imaging studies  CBC: Recent Labs  Lab 12/01/17 1852 12/03/17 0256 12/04/17 0301  WBC 5.9 6.9 6.4  HGB 13.7 14.0 13.7  HCT 42.0 42.0 41.1  MCV 94.2 92.1 92.4  PLT 135* 127* 073*   Basic Metabolic Panel: Recent Labs  Lab 12/01/17 1852 12/02/17 0422 12/03/17 0256  NA 140 140 141  K 3.8 3.7 3.9  CL 109 110 109  CO2 22 24 24   GLUCOSE 88 85 97  BUN 15 14 12   CREATININE 1.42*  1.33* 1.25*  CALCIUM 8.5* 8.2* 8.5*   GFR: Estimated Creatinine Clearance: 50.9 mL/min (A) (by C-G formula based on SCr of 1.25 mg/dL (H)). Liver Function Tests: No results for input(s): AST, ALT, ALKPHOS, BILITOT, PROT, ALBUMIN in the last 168 hours. No results for input(s): LIPASE, AMYLASE in the last 168 hours. No results for input(s): AMMONIA in the last 168 hours. Coagulation Profile: Recent Labs  Lab 12/02/17 0053  INR 1.08   Cardiac Enzymes: Recent Labs  Lab 12/02/17 0053 12/02/17 0906 12/02/17 1527  TROPONINI  <0.03 <0.03 <0.03   BNP (last 3 results) No results for input(s): PROBNP in the last 8760 hours. HbA1C: Recent Labs    12/02/17 0053  HGBA1C 5.3   CBG: No results for input(s): GLUCAP in the last 168 hours. Lipid Profile: Recent Labs    12/02/17 0053  CHOL 119  HDL 36*  LDLCALC 55  TRIG 140  CHOLHDL 3.3   Thyroid Function Tests: No results for input(s): TSH, T4TOTAL, FREET4, T3FREE, THYROIDAB in the last 72 hours. Anemia Panel: No results for input(s): VITAMINB12, FOLATE, FERRITIN, TIBC, IRON, RETICCTPCT in the last 72 hours. Sepsis Labs: No results for input(s): PROCALCITON, LATICACIDVEN in the last 168 hours.  Recent Results (from the past 240 hour(s))  MRSA PCR Screening     Status: None   Collection Time: 12/02/17 12:17 AM  Result Value Ref Range Status   MRSA by PCR NEGATIVE NEGATIVE Final    Comment:        The GeneXpert MRSA Assay (FDA approved for NASAL specimens only), is one component of a comprehensive MRSA colonization surveillance program. It is not intended to diagnose MRSA infection nor to guide or monitor treatment for MRSA infections. Performed at [facility], [place] [address], [place], [place] [address]      Radiology Studies: No results found.  Scheduled Meds: . aspirin  81 mg Oral Daily  . [START ON 12/05/2017] cholestyramine  4 g Oral QPC breakfast  . fluticasone  2 spray Each Nare QHS  . metoprolol  200 mg Oral Daily  . nitroGLYCERIN  0.5 inch Topical Q6H  . pantoprazole  40 mg Oral BID  . potassium chloride  10 mEq Oral QPM  . pravastatin  40 mg Oral Daily  . sodium chloride flush  3 mL Intravenous Q12H   Continuous Infusions: . sodium chloride    . heparin 1,050 Units/hr (12/04/17 1158)     LOS: 2 days   Marylu Lund, MD [facility] Pager [phone]  If 7PM-7AM, please contact night-coverage [email or web address] Password [facility] 12/04/2017, 1:57 PM

## 2017-12-04 NOTE — Progress Notes (Signed)
VASCULAR LAB PRELIMINARY  PRELIMINARY  PRELIMINARY  PRELIMINARY              Valorie Mcgrory, RVT 12/04/2017, 11:20 AM

## 2017-12-05 ENCOUNTER — Inpatient Hospital Stay (HOSPITAL_COMMUNITY): Payer: PPO

## 2017-12-05 DIAGNOSIS — I2510 Atherosclerotic heart disease of native coronary artery without angina pectoris: Secondary | ICD-10-CM

## 2017-12-05 LAB — PULMONARY FUNCTION TEST
DL/VA % pred: 78 %
DL/VA: 3.69 ml/min/mmHg/L
DLCO cor % pred: 61 %
DLCO cor: 21.71 ml/min/mmHg
DLCO unc % pred: 57 %
DLCO unc: 20.23 ml/min/mmHg
FEF 25-75 Post: 1.61 L/sec
FEF 25-75 Pre: 2.03 L/sec
FEF2575-%Change-Post: -20 %
FEF2575-%Pred-Post: 77 %
FEF2575-%Pred-Pre: 97 %
FEV1-%Change-Post: -20 %
FEV1-%Pred-Post: 62 %
FEV1-%Pred-Pre: 78 %
FEV1-Post: 1.92 L
FEV1-Pre: 2.42 L
FEV1FVC-%Change-Post: -27 %
FEV1FVC-%Pred-Pre: 107 %
FEV6-%Change-Post: 8 %
FEV6-%Pred-Post: 85 %
FEV6-%Pred-Pre: 78 %
FEV6-Post: 3.44 L
FEV6-Pre: 3.16 L
FEV6FVC-%Pred-Post: 106 %
FEV6FVC-%Pred-Pre: 106 %
FVC-%Change-Post: 8 %
FVC-%Pred-Post: 79 %
FVC-%Pred-Pre: 73 %
FVC-Post: 3.44 L
FVC-Pre: 3.16 L
Post FEV1/FVC ratio: 56 %
Post FEV6/FVC ratio: 100 %
Pre FEV1/FVC ratio: 77 %
Pre FEV6/FVC Ratio: 100 %
RV % pred: 129 %
RV: 3.63 L
TLC % pred: 92 %
TLC: 6.88 L

## 2017-12-05 LAB — CBC
HCT: 39.5 % (ref 39.0–52.0)
Hemoglobin: 12.4 g/dL — ABNORMAL LOW (ref 13.0–17.0)
MCH: 30.8 pg (ref 26.0–34.0)
MCHC: 31.4 g/dL (ref 30.0–36.0)
MCV: 98.3 fL (ref 78.0–100.0)
Platelets: 125 10*3/uL — ABNORMAL LOW (ref 150–400)
RBC: 4.02 MIL/uL — ABNORMAL LOW (ref 4.22–5.81)
RDW: 14.1 % (ref 11.5–15.5)
WBC: 5.3 10*3/uL (ref 4.0–10.5)

## 2017-12-05 LAB — HEPARIN LEVEL (UNFRACTIONATED): Heparin Unfractionated: 0.62 IU/mL (ref 0.30–0.70)

## 2017-12-05 MED ORDER — TRAZODONE HCL 50 MG PO TABS: 25.0000 mg | ORAL_TABLET | Freq: Every evening | ORAL | Status: DC | PRN

## 2017-12-05 MED ORDER — ALBUTEROL SULFATE (2.5 MG/3ML) 0.083% IN NEBU
2.5000 mg | INHALATION_SOLUTION | Freq: Once | RESPIRATORY_TRACT | Status: AC
Start: 2017-12-05 — End: 2017-12-05
  Administered 2017-12-05: 2.5 mg via RESPIRATORY_TRACT

## 2017-12-05 NOTE — Progress Notes (Signed)
Progress Note  Patient Name: Ronald CULL Sr. Date of Encounter: 12/05/2017  Primary Cardiologist: Harrington Challenger  Subjective   No chest pain discussed CABG procedure for [place] with Dr Roxan Hockey   Inpatient Medications    Scheduled Meds: . aspirin  81 mg Oral Daily  . cholestyramine  4 g Oral QPC breakfast  . fluticasone  2 spray Each Nare QHS  . metoprolol  200 mg Oral Daily  . nitroGLYCERIN  0.5 inch Topical Q6H  . pantoprazole  40 mg Oral BID  . potassium chloride  10 mEq Oral QPM  . pravastatin  40 mg Oral Daily  . sodium chloride flush  3 mL Intravenous Q12H   Continuous Infusions: . sodium chloride    . heparin 1,050 Units/hr (12/04/17 1158)   PRN Meds: sodium chloride, acetaminophen, nitroGLYCERIN, ondansetron (ZOFRAN) IV, ondansetron (ZOFRAN) IV, sodium chloride flush   Vital Signs    Vitals:   12/04/17 1932 12/04/17 2353 12/05/17 0349 12/05/17 0718  BP: 134/73 139/77 (!) 113/50 (!) 141/81  Pulse: (!) 58 (!) 57 63 63  Resp: (!) 24 20 17  (!) 22  Temp: 97.9 F (36.6 C) (!) 97.5 F (36.4 C)  98.4 F (36.9 C)  TempSrc: Oral Oral  Oral  SpO2: 96% 94% 97% 96%  Weight:   194 lb 12.8 oz (88.4 kg)   Height:        Intake/Output Summary (Last 24 hours) at 12/05/2017 0826 Last data filed at 12/05/2017 0724 Gross per 24 hour  Intake 1300.16 ml  Output 400 ml  Net 900.16 ml   Filed Weights   12/01/17 2346 12/04/17 0536 12/05/17 0349  Weight: 202 lb 2.6 oz (91.7 kg) 197 lb 1.6 oz (89.4 kg) 194 lb 12.8 oz (88.4 kg)    Telemetry    NSR 12/05/2017  - Personally Reviewed  ECG    12/02/17 SR RBBB Q waves 3,F no acute ST/T Wave changes   Affect appropriate Healthy:  appears stated age HEENT: normal Neck supple with no adenopathy JVP normal no bruits no thyromegaly Lungs clear with no wheezing and good diaphragmatic motion Heart:  S1/S2 no murmur, no rub, gallop or click PMI normal Abdomen: benighn, BS positve, no tenderness, no AAA no bruit.  No HSM or  HJR Distal pulses intact with no bruits No edema Neuro non-focal Skin warm and dry No muscular weakness   Labs    Chemistry Recent Labs  Lab 12/01/17 1852 12/02/17 0422 12/03/17 0256  NA 140 140 141  K 3.8 3.7 3.9  CL 109 110 109  CO2 22 24 24   GLUCOSE 88 85 97  BUN 15 14 12   CREATININE 1.42* 1.33* 1.25*  CALCIUM 8.5* 8.2* 8.5*  GFRNONAA 45* 48* 52*  GFRAA 52* 56* >60  ANIONGAP 9 6 8      Hematology Recent Labs  Lab 12/03/17 0256 12/04/17 0301 12/05/17 0306  WBC 6.9 6.4 5.3  RBC 4.56 4.45 4.02*  HGB 14.0 13.7 12.4*  HCT 42.0 41.1 39.5  MCV 92.1 92.4 98.3  MCH 30.7 30.8 30.8  MCHC 33.3 33.3 31.4  RDW 14.2 14.0 14.1  PLT 127* 114* 125*    Cardiac Enzymes Recent Labs  Lab 12/02/17 0053 12/02/17 0906 12/02/17 1527  TROPONINI <0.03 <0.03 <0.03    Recent Labs  Lab 12/01/17 1902  TROPIPOC 0.00     BNPNo results for input(s): BNP, PROBNP in the last 168 hours.   DDimer No results for input(s): DDIMER in the last 168 hours.  Radiology    No results found.  Cardiac Studies   11/24/2017 nuclear stress test: There is a medium defect of mild severity present in the apical anterior, apical septal and apex location.Findings consistent with ischemia. EF over 65%.  Cath December 02, 2017 Conclusion     Prox Cx lesion is 80% stenosed.  Ost 2nd Diag to 2nd Diag lesion is 75% stenosed.  Mid LAD lesion is 100% stenosed.  Mid Cx to Dist Cx lesion is 70% stenosed.  Prox LAD lesion is 75% stenosed.  The left ventricular systolic function is normal.  The left ventricular ejection fraction is 55-65% by visual estimate.  LV end diastolic pressure is mildly elevated.  Severe calcific 2 vessel CAD including LAD CTO, severe diagonal disease and severe disease in the mid and distal circumflex.  Faint collaterals to the distal LAD. Will obtain cardiac surgery consult.  Significant right subclavian tortuosity noted.   Echo September 01, 2016 - Left  ventricle: The cavity size was normal. Wall thickness was normal. Systolic function was normal. The estimated ejection fraction was in the range of 60% to 65%. Wall motion was normal; there were no regional wall motion abnormalities. Features are consistent with a pseudonormal left ventricular filling pattern, with concomitant abnormal relaxation and increased filling pressure (grade 2 diastolic dysfunction). - Aortic valve: There was no stenosis. - Mitral valve: There was no significant regurgitation. - Right ventricle: The cavity size was normal. Systolic function was normal. - Pulmonary arteries: No complete TR doppler jet so unable to estimate PA systolic pressure. - Systemic veins: IVC was not visualized.  Impressions:  - Normal LV size with EF 60-65%. Normal RV size and systolic function. No significant valvular abnormalities.  Patient Profile     83 y.o. male with unstable angina and severe calcific 2 vessel CAD including LAD CTO  Assessment & Plan    1. Unstable angina: for CABG Wednesday Dr. Roxan Hockey. Nitrate and heparin dependent for angina relief. Pre CABG dopplers have been ordered and are pending  2. HTN: Well controlled.  Continue current medications and low sodium Dash type diet.   3. HLP: On statin, LDL well within target range, slightly low HDL. Cardiac rehab post CABG   For questions or updates, please contact [place] Please consult [email or web address] for contact info under Cardiology/STEMI.      Signed, Jenkins Rouge, MD  12/05/2017, 8:26 AM

## 2017-12-05 NOTE — Progress Notes (Signed)
PROGRESS NOTE    Ronald Russell  VQQ:[id number] DOB: Feb 11, 1935 DOA: 12/01/2017 PCP: Claretta Fraise, MD    Brief Narrative:  83 y.o. male with medical history significant of hypertension, GERD, with recent abnormal stress testing done by cardiology department for which he was told was abnormal and is scheduled for heart catheterization in the morning.  Patient was driving today when he had substernal chest pain that felt like dull pressure that was persisted for hours.  The pain did not radiate anywhere and it was resolved in the ED with nitroglycerin.  Patient denies any cough or fevers.  He denies any nausea vomiting or diarrhea.  He denies any lower extremity edema or swelling.  Patient is currently chest pain-free.  Cardiology service was called who are requesting medicine to admit for him to get his heart catheterization performed.  Assessment & Plan:   Principal Problem:   Unstable angina (HCC) Active Problems:   Hypertension   GAD (generalized anxiety disorder)   HLD (hyperlipidemia)   Coronary artery disease involving native coronary artery of native heart with unstable angina pectoris ([place])  Unstable angina ([place]) -Patient had been continued on IV heparin.  -Continue on metoprolol -Cardiology following s/p cath with findings of two vessel disease not amenable to stent placement -CTS is following, recommendation for CABG with tentative plan for 6/26 -Remains without chest pains. No sob  Hypertension -Continued on home meds per home regimen -bp remains stable at present -resumed home metoprolol per above  GAD (generalized anxiety disorder) -remains stable at this time  HLD (hyperlipidemia) -continue statin per home regimen   DVT prophylaxis: SCD Code Status: Full Family Communication: Pt in room, family at bedside Disposition Plan: Uncertain. Planning CABG 6/26  Consultants:   Cardiology  CTS  Procedures:   Heart cath  6/21  Antimicrobials: Anti-infectives (From admission, onward)   None      Subjective: Without chest pain or SOB  Objective: Vitals:   12/04/17 2353 12/05/17 0349 12/05/17 0718 12/05/17 1115  BP: 139/77 (!) 113/50 (!) 141/81 127/66  Pulse: (!) 57 63 63 99  Resp: 20 17 (!) 22 20  Temp: (!) 97.5 F (36.4 C)  98.4 F (36.9 C) 98.1 F (36.7 C)  TempSrc: Oral  Oral Oral  SpO2: 94% 97% 96% 95%  Weight:  88.4 kg (194 lb 12.8 oz)    Height:        Intake/Output Summary (Last 24 hours) at 12/05/2017 1516 Last data filed at 12/05/2017 4332 Gross per 24 hour  Intake 1076.96 ml  Output 400 ml  Net 676.96 ml   Filed Weights   12/01/17 2346 12/04/17 0536 12/05/17 0349  Weight: 91.7 kg (202 lb 2.6 oz) 89.4 kg (197 lb 1.6 oz) 88.4 kg (194 lb 12.8 oz)    Examination: General exam: Conversant, in no acute distress Respiratory system: normal chest rise, clear, no audible wheezing Cardiovascular system: regular rhythm, s1-s2  Data Reviewed: I have personally reviewed following labs and imaging studies  CBC: Recent Labs  Lab 12/01/17 1852 12/03/17 0256 12/04/17 0301 12/05/17 0306  WBC 5.9 6.9 6.4 5.3  HGB 13.7 14.0 13.7 12.4*  HCT 42.0 42.0 41.1 39.5  MCV 94.2 92.1 92.4 98.3  PLT 135* 127* 114* 951*   Basic Metabolic Panel: Recent Labs  Lab 12/01/17 1852 12/02/17 0422 12/03/17 0256  NA 140 140 141  K 3.8 3.7 3.9  CL 109 110 109  CO2 22 24 24   GLUCOSE 88 85 97  BUN  15 14 12   CREATININE 1.42* 1.33* 1.25*  CALCIUM 8.5* 8.2* 8.5*   GFR: Estimated Creatinine Clearance: 50.9 mL/min (A) (by C-G formula based on SCr of 1.25 mg/dL (H)). Liver Function Tests: No results for input(s): AST, ALT, ALKPHOS, BILITOT, PROT, ALBUMIN in the last 168 hours. No results for input(s): LIPASE, AMYLASE in the last 168 hours. No results for input(s): AMMONIA in the last 168 hours. Coagulation Profile: Recent Labs  Lab 12/02/17 0053  INR 1.08   Cardiac Enzymes: Recent Labs  Lab  12/02/17 0053 12/02/17 0906 12/02/17 1527  TROPONINI <0.03 <0.03 <0.03   BNP (last 3 results) No results for input(s): PROBNP in the last 8760 hours. HbA1C: No results for input(s): HGBA1C in the last 72 hours. CBG: No results for input(s): GLUCAP in the last 168 hours. Lipid Profile: No results for input(s): CHOL, HDL, LDLCALC, TRIG, CHOLHDL, LDLDIRECT in the last 72 hours. Thyroid Function Tests: No results for input(s): TSH, T4TOTAL, FREET4, T3FREE, THYROIDAB in the last 72 hours. Anemia Panel: No results for input(s): VITAMINB12, FOLATE, FERRITIN, TIBC, IRON, RETICCTPCT in the last 72 hours. Sepsis Labs: No results for input(s): PROCALCITON, LATICACIDVEN in the last 168 hours.  Recent Results (from the past 240 hour(s))  MRSA PCR Screening     Status: None   Collection Time: 12/02/17 12:17 AM  Result Value Ref Range Status   MRSA by PCR NEGATIVE NEGATIVE Final    Comment:        The GeneXpert MRSA Assay (FDA approved for NASAL specimens only), is one component of a comprehensive MRSA colonization surveillance program. It is not intended to diagnose MRSA infection nor to guide or monitor treatment for MRSA infections. Performed at [facility], [place] [address], [place], [place] [address]      Radiology Studies: No results found.  Scheduled Meds: . aspirin  81 mg Oral Daily  . cholestyramine  4 g Oral QPC breakfast  . fluticasone  2 spray Each Nare QHS  . metoprolol  200 mg Oral Daily  . nitroGLYCERIN  0.5 inch Topical Q6H  . pantoprazole  40 mg Oral BID  . potassium chloride  10 mEq Oral QPM  . pravastatin  40 mg Oral Daily  . sodium chloride flush  3 mL Intravenous Q12H   Continuous Infusions: . sodium chloride    . heparin 1,050 Units/hr (12/05/17 1122)     LOS: 3 days   Marylu Lund, MD [facility] Pager [phone]  If 7PM-7AM, please contact night-coverage [email or web address] Password [facility] 12/05/2017, 3:16 PM

## 2017-12-05 NOTE — Progress Notes (Signed)
[place] for Heparin Indication: ACS, s/p cath, awaiting CABG  Patient Measurements: Height: 6' (182.9 cm) Weight: 194 lb 12.8 oz (88.4 kg) IBW/kg (Calculated) : 77.6 Heparin Dosing Weight: 91 kg  Vital Signs: Temp: 97.5 F (36.4 C) (06/23 2353) Temp Source: Oral (06/23 2353) BP: 113/50 (06/24 0349) Pulse Rate: 63 (06/24 0349)  Labs: Recent Labs    12/02/17 0906 12/02/17 1527  12/03/17 0256 12/03/17 1152 12/04/17 0301 12/05/17 0306  HGB  --   --    < > 14.0  --  13.7 12.4*  HCT  --   --   --  42.0  --  41.1 39.5  PLT  --   --   --  127*  --  114* 125*  HEPARINUNFRC  --   --    < > 0.30 0.43 0.54 0.62  CREATININE  --   --   --  1.25*  --   --   --   TROPONINI <0.03 <0.03  --   --   --   --   --    < > = values in this interval not displayed.   Medical History: Past Medical History:  Diagnosis Date  . Allergy   . Anxiety   . Bladder cancer ([place]) 2010   bladder  . Cataract   . Chronic bronchitis ([place])    "yearly the last 3 yrs" (02/13/2013)  . Clotting disorder ([place])   . Exertional shortness of breath    "the last 6 months" (02/13/2013)  . GERD (gastroesophageal reflux disease)   . History of blood transfusion 1949   "seeral" (02/13/2013)  . HOH (hard of hearing)   . Hypertension   . Ocular migraine    "not often" (02/13/2013)  . Pneumonia    "last time ~ 2 yr ago; had it before that too" (02/13/2013)  . PONV (postoperative nausea and vomiting)   . Seasonal allergies   . Shingles    has neuropathy since it happened in 6/17  . Temporal arteritis [facility])    Assessment: 83 yo male now s/p cath which showed 2 vessel disease.  Planning to resume heparin as bridge to CABG. Planning CABG on 6/26.  Heparin level this morning is therapeutic at 0.62, on 1050 units/hr. No s/sx of bleeding. No infusion issues document. Hgb down slightly to 12.4, plt 125.   Goal of Therapy:  Heparin level 0.3-0.7 units/ml Monitor platelets by  anticoagulation protocol: Yes   Plan: Cont heparin at 1050 units/hr Daily heparin level and CBC.  Tentative CABG 6/26   Doylene Canard, PharmD Clinical Pharmacist  Pager: [phone] Phone: [phone]  12/05/2017 7:12 AM

## 2017-12-05 NOTE — Progress Notes (Signed)
CARDIAC REHAB PHASE I   PRE:  Rate/Rhythm: 77 SR    BP: sitting 122/73    SaO2:   MODE:  Ambulation: 860 ft   POST:  Rate/Rhythm: 81 SR    BP: sitting 141/78     SaO2:   Tolerated well, talked entire walk. Denied CP or sx. Discussed sternal precautions, mobility, IS (2596mL), and d/c planning. Voiced understanding. Gave pt booklet and careguide for surgery. He is married, he has a home here and in [place]. Encouraged him to live here for a couple weeks atleast post op. Wife can care for him.  [place], ACSM 12/05/2017 10:56 AM

## 2017-12-05 NOTE — Consult Note (Signed)
   [facility] CM Inpatient Consult   12/05/2017  Ronald RIPBERGER Sr. December 27, 1934 [id number]   Patient screened for [place] Management services.  Patient from [facility] had [place] Management services in the past. Patient is currently admitted for unstable angina and now has a CABG scheduled for 12/07/17.  Patient in the [employer] plan.  Will follow for needs,  Progress,  and disposition which is currently unclear   Please place a [place] Management consult or for questions contact:   Natividad Brood, RN BSN [facility] Liaison  [phone] business mobile phone Toll free office [phone]

## 2017-12-05 NOTE — Plan of Care (Signed)
  Problem: Education: Goal: Knowledge of General Education information will improve Outcome: Progressing   Problem: Health Behavior/Discharge Planning: Goal: Ability to manage health-related needs will improve Outcome: Progressing   Problem: Clinical Measurements: Goal: Ability to maintain clinical measurements within normal limits will improve Outcome: Progressing Goal: Will remain free from infection Outcome: Progressing Goal: Diagnostic test results will improve Outcome: Progressing Goal: Respiratory complications will improve Outcome: Progressing Goal: Cardiovascular complication will be avoided Outcome: Progressing   Problem: Activity: Goal: Risk for activity intolerance will decrease Outcome: Progressing   Problem: Coping: Goal: Level of anxiety will decrease Outcome: Progressing   Problem: Elimination: Goal: Will not experience complications related to bowel motility Outcome: Progressing Goal: Will not experience complications related to urinary retention Outcome: Progressing   Problem: Pain Managment: Goal: General experience of comfort will improve Outcome: Progressing   Problem: Safety: Goal: Ability to remain free from injury will improve Outcome: Progressing   Problem: Skin Integrity: Goal: Risk for impaired skin integrity will decrease Outcome: Progressing   Problem: Education: Goal: Understanding of CV disease, CV risk reduction, and recovery process will improve Outcome: Progressing   Problem: Activity: Goal: Ability to return to baseline activity level will improve Outcome: Progressing   Problem: Cardiovascular: Goal: Ability to achieve and maintain adequate cardiovascular perfusion will improve Outcome: Progressing Goal: Vascular access site(s) Level 0-1 will be maintained Outcome: Progressing   Problem: Health Behavior/Discharge Planning: Goal: Ability to safely manage health-related needs after discharge will improve Outcome:  Progressing

## 2017-12-06 DIAGNOSIS — I2510 Atherosclerotic heart disease of native coronary artery without angina pectoris: Secondary | ICD-10-CM

## 2017-12-06 LAB — COMPREHENSIVE METABOLIC PANEL
ALT: 32 U/L (ref 0–44)
AST: 36 U/L (ref 15–41)
Albumin: 3.6 g/dL (ref 3.5–5.0)
Alkaline Phosphatase: 44 U/L (ref 38–126)
Anion gap: 9 (ref 5–15)
BUN: 13 mg/dL (ref 8–23)
CO2: 23 mmol/L (ref 22–32)
Calcium: 8.8 mg/dL — ABNORMAL LOW (ref 8.9–10.3)
Chloride: 108 mmol/L (ref 98–111)
Creatinine, Ser: 1.36 mg/dL — ABNORMAL HIGH (ref 0.61–1.24)
GFR calc Af Amer: 55 mL/min — ABNORMAL LOW (ref >60)
GFR calc non Af Amer: 47 mL/min — ABNORMAL LOW (ref >60)
Glucose, Bld: 115 mg/dL — ABNORMAL HIGH (ref 70–99)
Potassium: 4 mmol/L (ref 3.5–5.1)
Sodium: 140 mmol/L (ref 135–145)
Total Bilirubin: 1.2 mg/dL (ref 0.3–1.2)
Total Protein: 6.3 g/dL — ABNORMAL LOW (ref 6.5–8.1)

## 2017-12-06 LAB — BLOOD GAS, ARTERIAL
Acid-base deficit: 1.8 mmol/L (ref 0.0–2.0)
Bicarbonate: 22.4 mmol/L (ref 20.0–28.0)
Drawn by: 398991
FIO2: 21
O2 Saturation: 97.3 %
Patient temperature: 98.6
pCO2 arterial: 37.7 mmHg (ref 32.0–48.0)
pH, Arterial: 7.391 (ref 7.350–7.450)
pO2, Arterial: 93.6 mmHg (ref 83.0–108.0)

## 2017-12-06 LAB — CBC
HCT: 39.5 % (ref 39.0–52.0)
Hemoglobin: 12.9 g/dL — ABNORMAL LOW (ref 13.0–17.0)
MCH: 30.4 pg (ref 26.0–34.0)
MCHC: 32.7 g/dL (ref 30.0–36.0)
MCV: 93.2 fL (ref 78.0–100.0)
Platelets: 131 10*3/uL — ABNORMAL LOW (ref 150–400)
RBC: 4.24 MIL/uL (ref 4.22–5.81)
RDW: 14.1 % (ref 11.5–15.5)
WBC: 6.4 10*3/uL (ref 4.0–10.5)

## 2017-12-06 LAB — URINALYSIS, ROUTINE W REFLEX MICROSCOPIC
Bilirubin Urine: NEGATIVE
Glucose, UA: NEGATIVE mg/dL
Hgb urine dipstick: NEGATIVE
Ketones, ur: NEGATIVE mg/dL
Leukocytes, UA: NEGATIVE
Nitrite: NEGATIVE
Protein, ur: NEGATIVE mg/dL
Specific Gravity, Urine: 1.013 (ref 1.005–1.030)
pH: 5 (ref 5.0–8.0)

## 2017-12-06 LAB — SURGICAL PCR SCREEN
MRSA, PCR: NEGATIVE
Staphylococcus aureus: NEGATIVE

## 2017-12-06 LAB — ABO/RH: ABO/RH(D): AB POS

## 2017-12-06 LAB — HEPARIN LEVEL (UNFRACTIONATED): Heparin Unfractionated: 0.5 IU/mL (ref 0.30–0.70)

## 2017-12-06 LAB — APTT: aPTT: 110 seconds — ABNORMAL HIGH (ref 24–36)

## 2017-12-06 MED ORDER — SODIUM CHLORIDE 0.9 % IV SOLN
1.5000 g | INTRAVENOUS | Status: AC
  Administered 2017-12-07: 1.5 g via INTRAVENOUS
  Administered 2017-12-07: .75 g via INTRAVENOUS
  Filled 2017-12-06: qty 1.5

## 2017-12-06 MED ORDER — HEPARIN SODIUM (PORCINE) 1000 UNIT/ML IJ SOLN
INTRAMUSCULAR | Status: DC
  Filled 2017-12-06: qty 30

## 2017-12-06 MED ORDER — MILRINONE LACTATE IN DEXTROSE 20-5 MG/100ML-% IV SOLN
0.1250 ug/kg/min | INTRAVENOUS | Status: DC
  Filled 2017-12-06: qty 100

## 2017-12-06 MED ORDER — MAGNESIUM SULFATE 50 % IJ SOLN
40.0000 meq | INTRAMUSCULAR | Status: DC
  Filled 2017-12-06: qty 9.85

## 2017-12-06 MED ORDER — BISACODYL 5 MG PO TBEC: 5.0000 mg | DELAYED_RELEASE_TABLET | Freq: Once | ORAL | Status: DC

## 2017-12-06 MED ORDER — CHLORHEXIDINE GLUCONATE 0.12 % MT SOLN
15.0000 mL | Freq: Once | OROMUCOSAL | Status: AC
  Administered 2017-12-07: 15 mL via OROMUCOSAL
  Filled 2017-12-06: qty 15

## 2017-12-06 MED ORDER — SODIUM CHLORIDE 0.9 % IV SOLN
INTRAVENOUS | Status: AC
  Administered 2017-12-07: .8 [IU]/h via INTRAVENOUS
  Filled 2017-12-06: qty 1

## 2017-12-06 MED ORDER — SODIUM CHLORIDE 0.9 % IV SOLN
30.0000 ug/min | INTRAVENOUS | Status: AC
  Administered 2017-12-07: 50 ug/min via INTRAVENOUS
  Filled 2017-12-06: qty 2

## 2017-12-06 MED ORDER — POTASSIUM CHLORIDE 2 MEQ/ML IV SOLN
80.0000 meq | INTRAVENOUS | Status: DC
  Filled 2017-12-06: qty 40

## 2017-12-06 MED ORDER — GUAIFENESIN ER 600 MG PO TB12
1200.0000 mg | ORAL_TABLET | Freq: Two times a day (BID) | ORAL | Status: DC
Start: 2017-12-06 — End: 2017-12-07
  Administered 2017-12-06 (×2): 1200 mg via ORAL
  Filled 2017-12-06 (×2): qty 2

## 2017-12-06 MED ORDER — DEXMEDETOMIDINE HCL IN NACL 400 MCG/100ML IV SOLN
0.1000 ug/kg/h | INTRAVENOUS | Status: AC
  Administered 2017-12-07: .2 ug/kg/h via INTRAVENOUS
  Filled 2017-12-06: qty 100

## 2017-12-06 MED ORDER — ZOLPIDEM TARTRATE 5 MG PO TABS
5.0000 mg | ORAL_TABLET | Freq: Every evening | ORAL | Status: DC | PRN
  Administered 2017-12-06 (×2): 5 mg via ORAL
  Filled 2017-12-06 (×2): qty 1

## 2017-12-06 MED ORDER — VANCOMYCIN HCL 10 G IV SOLR
1250.0000 mg | INTRAVENOUS | Status: DC
  Filled 2017-12-06: qty 1250

## 2017-12-06 MED ORDER — CHLORHEXIDINE GLUCONATE CLOTH 2 % EX PADS
6.0000 | MEDICATED_PAD | Freq: Once | CUTANEOUS | Status: AC
  Administered 2017-12-06: 6 via TOPICAL

## 2017-12-06 MED ORDER — VANCOMYCIN HCL 10 G IV SOLR
1500.0000 mg | INTRAVENOUS | Status: AC
  Administered 2017-12-07: 1500 mg via INTRAVENOUS
  Filled 2017-12-06: qty 1500

## 2017-12-06 MED ORDER — TRANEXAMIC ACID 1000 MG/10ML IV SOLN
1.5000 mg/kg/h | INTRAVENOUS | Status: AC
  Administered 2017-12-07: 1.5 mg/kg/h via INTRAVENOUS
  Filled 2017-12-06: qty 25

## 2017-12-06 MED ORDER — FLUTICASONE PROPIONATE 50 MCG/ACT NA SUSP
1.0000 | Freq: Every day | NASAL | Status: DC | PRN
  Filled 2017-12-06: qty 16

## 2017-12-06 MED ORDER — SODIUM CHLORIDE 0.9 % IV SOLN
750.0000 mg | INTRAVENOUS | Status: DC
  Filled 2017-12-06: qty 750

## 2017-12-06 MED ORDER — TEMAZEPAM 15 MG PO CAPS
15.0000 mg | ORAL_CAPSULE | Freq: Once | ORAL | Status: DC | PRN
Start: 2017-12-06 — End: 2017-12-07

## 2017-12-06 MED ORDER — DEXTROSE 5 % IV SOLN
800.0000 mg | Freq: Two times a day (BID) | INTRAVENOUS | Status: DC
  Administered 2017-12-06 (×2): 800 mg via INTRAVENOUS
  Filled 2017-12-06 (×4): qty 16

## 2017-12-06 MED ORDER — LORATADINE 10 MG PO TABS
10.0000 mg | ORAL_TABLET | Freq: Every day | ORAL | Status: DC
  Administered 2017-12-06 – 2017-12-14 (×8): 10 mg via ORAL
  Filled 2017-12-06 (×8): qty 1

## 2017-12-06 MED ORDER — TRANEXAMIC ACID (OHS) PUMP PRIME SOLUTION
2.0000 mg/kg | INTRAVENOUS | Status: DC
  Filled 2017-12-06: qty 1.79

## 2017-12-06 MED ORDER — DIAZEPAM 2 MG PO TABS
2.0000 mg | ORAL_TABLET | Freq: Once | ORAL | Status: AC
  Administered 2017-12-07: 2 mg via ORAL
  Filled 2017-12-06: qty 1

## 2017-12-06 MED ORDER — METOPROLOL TARTRATE 12.5 MG HALF TABLET
12.5000 mg | ORAL_TABLET | Freq: Once | ORAL | Status: AC
  Administered 2017-12-07: 12.5 mg via ORAL
  Filled 2017-12-06: qty 1

## 2017-12-06 MED ORDER — TRANEXAMIC ACID (OHS) BOLUS VIA INFUSION
15.0000 mg/kg | INTRAVENOUS | Status: AC
  Administered 2017-12-07: 1342.5 mg via INTRAVENOUS
  Filled 2017-12-06: qty 1343

## 2017-12-06 MED ORDER — PLASMA-LYTE 148 IV SOLN
INTRAVENOUS | Status: AC
  Administered 2017-12-07: 08:00:00
  Filled 2017-12-06: qty 2.5

## 2017-12-06 MED ORDER — DEXTROSE 5 % IV SOLN
0.0000 ug/min | INTRAVENOUS | Status: DC
  Filled 2017-12-06: qty 4

## 2017-12-06 MED ORDER — DOPAMINE-DEXTROSE 3.2-5 MG/ML-% IV SOLN
0.0000 ug/kg/min | INTRAVENOUS | Status: AC
  Administered 2017-12-07: 3 ug/kg/min via INTRAVENOUS
  Filled 2017-12-06: qty 250

## 2017-12-06 MED ORDER — NITROGLYCERIN IN D5W 200-5 MCG/ML-% IV SOLN
2.0000 ug/min | INTRAVENOUS | Status: AC
  Administered 2017-12-07: 2.5 ug/min via INTRAVENOUS
  Filled 2017-12-06: qty 250

## 2017-12-06 MED ORDER — CHLORHEXIDINE GLUCONATE CLOTH 2 % EX PADS
6.0000 | MEDICATED_PAD | Freq: Once | CUTANEOUS | Status: AC
  Administered 2017-12-07: 6 via TOPICAL

## 2017-12-06 NOTE — Progress Notes (Signed)
Progress Note  Patient Name: Ronald BLOUCH Sr. Date of Encounter: 12/06/2017  Primary Cardiologist: Harrington Challenger  Subjective   No chest pain discussed CABG procedure for [place] with Dr Roxan Hockey  He seems more concerned with pain from shingles in right arm   Inpatient Medications    Scheduled Meds: . aspirin  81 mg Oral Daily  . cholestyramine  4 g Oral QPC breakfast  . fluticasone  2 spray Each Nare QHS  . [START ON 12/07/2017] heparin-papaverine-plasmalyte irrigation   Irrigation To OR  . [START ON 12/07/2017] magnesium sulfate  40 mEq Other To OR  . metoprolol  200 mg Oral Daily  . nitroGLYCERIN  0.5 inch Topical Q6H  . pantoprazole  40 mg Oral BID  . potassium chloride  10 mEq Oral QPM  . [START ON 12/07/2017] potassium chloride  80 mEq Other To OR  . pravastatin  40 mg Oral Daily  . sodium chloride flush  3 mL Intravenous Q12H  . [START ON 12/07/2017] tranexamic acid  15 mg/kg Intravenous To OR  . [START ON 12/07/2017] tranexamic acid  2 mg/kg Intracatheter To OR   Continuous Infusions: . sodium chloride    . [START ON 12/07/2017] cefUROXime (ZINACEF)  IV    . [START ON 12/07/2017] cefUROXime (ZINACEF)  IV    . [START ON 12/07/2017] dexmedetomidine    . [START ON 12/07/2017] DOPamine    . [START ON 12/07/2017] epinephrine    . [START ON 12/07/2017] heparin 30,000 units/NS 1000 mL solution for CELLSAVER    . heparin 1,050 Units/hr (12/06/17 0400)  . [START ON 12/07/2017] insulin (NOVOLIN-R) infusion    . [START ON 12/07/2017] milrinone    . [START ON 12/07/2017] nitroGLYCERIN    . [START ON 12/07/2017] phenylephrine 20mg /290mL NS (0.08mg /ml) infusion    . [START ON 12/07/2017] tranexamic acid (CYKLOKAPRON) infusion ([facility])    . [START ON 12/07/2017] vancomycin     PRN Meds: sodium chloride, acetaminophen, nitroGLYCERIN, ondansetron (ZOFRAN) IV, ondansetron (ZOFRAN) IV, sodium chloride flush, zolpidem   Vital Signs    Vitals:   12/06/17 0013 12/06/17 0319 12/06/17 0500 12/06/17  0725  BP: (!) 147/77 (!) 113/37  (!) 89/49  Pulse: (!) 56 (!) 57  (!) 57  Resp: 15 17  15   Temp:  98.1 F (36.7 C)  97.8 F (36.6 C)  TempSrc:  Oral  Oral  SpO2: 96% 94%  92%  Weight:   197 lb 4.8 oz (89.5 kg)   Height:        Intake/Output Summary (Last 24 hours) at 12/06/2017 0805 Last data filed at 12/06/2017 0400 Gross per 24 hour  Intake 1056.3 ml  Output -  Net 1056.3 ml   Filed Weights   12/04/17 0536 12/05/17 0349 12/06/17 0500  Weight: 197 lb 1.6 oz (89.4 kg) 194 lb 12.8 oz (88.4 kg) 197 lb 4.8 oz (89.5 kg)    Telemetry    NSR 12/06/2017  - Personally Reviewed  ECG    12/02/17 SR RBBB Q waves 3,F no acute ST/T Wave changes   Affect appropriate Healthy:  appears stated age 83: normal Neck supple with no adenopathy JVP normal no bruits no thyromegaly Lungs clear with no wheezing and good diaphragmatic motion Heart:  S1/S2 no murmur, no rub, gallop or click PMI normal Abdomen: benighn, BS positve, no tenderness, no AAA no bruit.  No HSM or HJR Distal pulses intact with no bruits No edema Neuro non-focal Skin warm and dry No muscular weakness Bad  veins in RLE    Labs    Chemistry Recent Labs  Lab 12/01/17 1852 12/02/17 0422 12/03/17 0256  NA 140 140 141  K 3.8 3.7 3.9  CL 109 110 109  CO2 22 24 24   GLUCOSE 88 85 97  BUN 15 14 12   CREATININE 1.42* 1.33* 1.25*  CALCIUM 8.5* 8.2* 8.5*  GFRNONAA 45* 48* 52*  GFRAA 52* 56* >60  ANIONGAP 9 6 8      Hematology Recent Labs  Lab 12/04/17 0301 12/05/17 0306 12/06/17 0359  WBC 6.4 5.3 6.4  RBC 4.45 4.02* 4.24  HGB 13.7 12.4* 12.9*  HCT 41.1 39.5 39.5  MCV 92.4 98.3 93.2  MCH 30.8 30.8 30.4  MCHC 33.3 31.4 32.7  RDW 14.0 14.1 14.1  PLT 114* 125* 131*    Cardiac Enzymes Recent Labs  Lab 12/02/17 0053 12/02/17 0906 12/02/17 1527  TROPONINI <0.03 <0.03 <0.03    Recent Labs  Lab 12/01/17 1902  TROPIPOC 0.00     BNPNo results for input(s): BNP, PROBNP in the last 168 hours.    DDimer No results for input(s): DDIMER in the last 168 hours.   Radiology    No results found.  Cardiac Studies   11/24/2017 nuclear stress test: There is a medium defect of mild severity present in the apical anterior, apical septal and apex location.Findings consistent with ischemia. EF over 65%.  Cath December 02, 2017 Conclusion     Prox Cx lesion is 80% stenosed.  Ost 2nd Diag to 2nd Diag lesion is 75% stenosed.  Mid LAD lesion is 100% stenosed.  Mid Cx to Dist Cx lesion is 70% stenosed.  Prox LAD lesion is 75% stenosed.  The left ventricular systolic function is normal.  The left ventricular ejection fraction is 55-65% by visual estimate.  LV end diastolic pressure is mildly elevated.  Severe calcific 2 vessel CAD including LAD CTO, severe diagonal disease and severe disease in the mid and distal circumflex.  Faint collaterals to the distal LAD. Will obtain cardiac surgery consult.  Significant right subclavian tortuosity noted.   Echo September 01, 2016 - Left ventricle: The cavity size was normal. Wall thickness was normal. Systolic function was normal. The estimated ejection fraction was in the range of 60% to 65%. Wall motion was normal; there were no regional wall motion abnormalities. Features are consistent with a pseudonormal left ventricular filling pattern, with concomitant abnormal relaxation and increased filling pressure (grade 2 diastolic dysfunction). - Aortic valve: There was no stenosis. - Mitral valve: There was no significant regurgitation. - Right ventricle: The cavity size was normal. Systolic function was normal. - Pulmonary arteries: No complete TR doppler jet so unable to estimate PA systolic pressure. - Systemic veins: IVC was not visualized.  Impressions:  - Normal LV size with EF 60-65%. Normal RV size and systolic function. No significant valvular abnormalities.  Patient Profile     83 y.o. male  with unstable angina and severe calcific 2 vessel CAD including LAD CTO  Assessment & Plan    1. Unstable angina: for CABG tomorrow  Dr. Roxan Hockey. Continue nitro patch and heparin Pre CABG dopplers ok  2. HTN: Well controlled.  Continue current medications and low sodium Dash type diet.   3. HLP: On statin, LDL well within target range, slightly low HDL. Cardiac rehab post CABG 4.  Shingles:  Should be on neurontin for paresthesias in hand/ thumb   For questions or updates, please contact [place] Please consult [email or web address] for  contact info under Cardiology/STEMI.      Signed, Jenkins Rouge, MD  12/06/2017, 8:05 AM

## 2017-12-06 NOTE — Anesthesia Preprocedure Evaluation (Addendum)
Anesthesia Evaluation  Patient identified by MRN, date of birth, ID band Patient awake    Reviewed: Allergy & Precautions, H&P , NPO status , Patient's Chart, lab work & pertinent test results  History of Anesthesia Complications (+) PONV and history of anesthetic complications  Airway Mallampati: II  TM Distance: >3 FB Neck ROM: Full    Dental  (+) Teeth Intact, Dental Advisory Given   Pulmonary former smoker,    breath sounds clear to auscultation       Cardiovascular hypertension, Pt. on medications + angina + CAD and + Peripheral Vascular Disease   Rhythm:Regular Rate:Normal  Temporal arteritis  '19 Cath -  Prox Cx lesion is 80% stenosed.  Ost 2nd Diag to 2nd Diag lesion is 75% stenosed.  Mid LAD lesion is 100% stenosed.  Mid Cx to Dist Cx lesion is 70% stenosed.  Prox LAD lesion is 75% stenosed.  The left ventricular systolic function is normal.  The left ventricular ejection fraction is 55-65% by visual estimate.  LV end diastolic pressure is mildly elevated.  '19 Carotid US - near normal carotids  '19 Myoperfusion -  Nuclear stress EF: 85%.  There was no ST segment deviation noted during stress.  No T wave inversion was noted during stress.  Defect 1: There is a medium defect of mild severity present in the apical anterior, apical septal and apex location.  Findings consistent with ischemia.  This is an intermediate risk study.  The left ventricular ejection fraction is hyperdynamic (>65%   '18 TTE - Normal LV cavity and thickness. EF 60% to 65%. Grade 2 diastolic dysfunction. Normal RV. No valvulopathy noted.   Neuro/Psych  Headaches, PSYCHIATRIC DISORDERS Anxiety Hearing loss    GI/Hepatic Neg liver ROS, GERD  Medicated and Controlled,  Endo/Other  negative endocrine ROS  Renal/GU Renal diseasenegative Renal ROS   Bladder cancer    Musculoskeletal   Abdominal   Peds   Hematology Thrombocytopenia   Anesthesia Other Findings Shingles right arm  Reproductive/Obstetrics negative OB ROS                            Anesthesia Physical  Anesthesia Plan  ASA: IV  Anesthesia Plan: General   Post-op Pain Management:    Induction: Intravenous  PONV Risk Score and Plan: 3 and Treatment may vary due to age or medical condition  Airway Management Planned: Oral ETT  Additional Equipment: Arterial line, CVP, PA Cath, TEE and Ultrasound Guidance Line Placement  Intra-op Plan:   Post-operative Plan: Post-operative intubation/ventilation  Informed Consent: I have reviewed the patients History and Physical, chart, labs and discussed the procedure including the risks, benefits and alternatives for the proposed anesthesia with the patient or authorized representative who has indicated his/her understanding and acceptance.   Dental advisory given  Plan Discussed with: CRNA and Anesthesiologist  Anesthesia Plan Comments:         Anesthesia Quick Evaluation

## 2017-12-06 NOTE — Progress Notes (Signed)
   12/06/17 1200  Clinical Encounter Type  Visited With Patient  Visit Type Initial;Spiritual support  Referral From Patient;Nurse  Consult/Referral To Chaplain  Spiritual Encounters  Spiritual Needs Prayer   Responded to [facility] for support for the patient.  Patient stated he is having by-pass surgery tomorrow.  Shared a lot about his life and his struggles, but what a good place he is in now.  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  Just got married last October.  Seems to have a good support network of friends.  He has a positive outlook and ready to get the surgery past him and back to life.  Will follow and support as needed. Chaplain Katherene Ponto

## 2017-12-06 NOTE — Progress Notes (Signed)
PROGRESS NOTE    Ronald Russell  KZS:[id number] DOB: 1934/11/23 DOA: 12/01/2017 PCP: Claretta Fraise, MD    Brief Narrative:  83 y.o. male with medical history significant of hypertension, GERD, with recent abnormal stress testing done by cardiology department for which he was told was abnormal and is scheduled for heart catheterization in the morning.  Patient was driving today when he had substernal chest pain that felt like dull pressure that was persisted for hours.  The pain did not radiate anywhere and it was resolved in the ED with nitroglycerin.  Patient denies any cough or fevers.  He denies any nausea vomiting or diarrhea.  He denies any lower extremity edema or swelling.  Patient is currently chest pain-free.  Cardiology service was called who are requesting medicine to admit for him to get his heart catheterization performed.  Assessment & Plan:   Principal Problem:   Unstable angina (HCC) Active Problems:   Hypertension   GAD (generalized anxiety disorder)   HLD (hyperlipidemia)   Coronary artery disease involving native coronary artery of native heart with unstable angina pectoris ([place])  Unstable angina ([place]) -Patient had been continued on IV heparin.  -Continue on metoprolol -Cardiology following s/p cath with findings of two vessel disease not amenable to stent placement -CTS is following, recommendation for CABG with tentative plan for 6/26 -No chest pains this AM  Hypertension -Continued on home meds per home regimen -bp stable currently  -Patient continued on metoprolol  GAD (generalized anxiety disorder) -remains stable at this time -Stable currently  HLD (hyperlipidemia) -continue statin per home regimen -presently stable  DVT prophylaxis: SCD Code Status: Full Family Communication: Pt in room, family at bedside Disposition Plan: Uncertain. Planning CABG 6/26  Consultants:   Cardiology  CTS  Procedures:   Heart cath  6/21  Antimicrobials: Anti-infectives (From admission, onward)   Start     Dose/Rate Route Frequency Ordered Stop   12/07/17 0400  vancomycin (VANCOCIN) 1,250 mg in sodium chloride 0.9 % 250 mL IVPB  Status:  Discontinued     1,250 mg 166.7 mL/hr over 90 Minutes Intravenous To Surgery 12/06/17 0755 12/06/17 0759   12/07/17 0400  cefUROXime (ZINACEF) 1.5 g in sodium chloride 0.9 % 100 mL IVPB     1.5 g 200 mL/hr over 30 Minutes Intravenous To Surgery 12/06/17 0755 12/08/17 0400   12/07/17 0400  cefUROXime (ZINACEF) 750 mg in sodium chloride 0.9 % 100 mL IVPB     750 mg 200 mL/hr over 30 Minutes Intravenous To Surgery 12/06/17 0755 12/08/17 0400   12/07/17 0400  vancomycin (VANCOCIN) 1,500 mg in sodium chloride 0.9 % 250 mL IVPB     1,500 mg 125 mL/hr over 120 Minutes Intravenous To Surgery 12/06/17 0801 12/08/17 0400   12/06/17 1030  acyclovir (ZOVIRAX) 800 mg in dextrose 5 % 150 mL IVPB     800 mg 166 mL/hr over 60 Minutes Intravenous Every 12 hours 12/06/17 0919        Subjective: No chest pains or SOB  Objective: Vitals:   12/06/17 0500 12/06/17 0725 12/06/17 0737 12/06/17 1100  BP:  (!) 89/49 102/67 118/79  Pulse:  (!) 57 63 (!) 56  Resp:  15 16 15   Temp:  97.8 F (36.6 C)  97.6 F (36.4 C)  TempSrc:  Oral  Oral  SpO2:  92% 94% 97%  Weight: 89.5 kg (197 lb 4.8 oz)     Height:        Intake/Output Summary (  Last 24 hours) at 12/06/2017 1300 Last data filed at 12/06/2017 1100 Gross per 24 hour  Intake 757.79 ml  Output -  Net 757.79 ml   Filed Weights   12/04/17 0536 12/05/17 0349 12/06/17 0500  Weight: 89.4 kg (197 lb 1.6 oz) 88.4 kg (194 lb 12.8 oz) 89.5 kg (197 lb 4.8 oz)    Examination: General exam: Awake, laying in bed, in nad Respiratory system: Normal respiratory effort, no wheezing Cardiovascular system: regular rate, s1, s2  Data Reviewed: I have personally reviewed following labs and imaging studies  CBC: Recent Labs  Lab 12/01/17 1852  12/03/17 0256 12/04/17 0301 12/05/17 0306 12/06/17 0359  WBC 5.9 6.9 6.4 5.3 6.4  HGB 13.7 14.0 13.7 12.4* 12.9*  HCT 42.0 42.0 41.1 39.5 39.5  MCV 94.2 92.1 92.4 98.3 93.2  PLT 135* 127* 114* 125* 882*   Basic Metabolic Panel: Recent Labs  Lab 12/01/17 1852 12/02/17 0422 12/03/17 0256  NA 140 140 141  K 3.8 3.7 3.9  CL 109 110 109  CO2 22 24 24   GLUCOSE 88 85 97  BUN 15 14 12   CREATININE 1.42* 1.33* 1.25*  CALCIUM 8.5* 8.2* 8.5*   GFR: Estimated Creatinine Clearance: 50.9 mL/min (A) (by C-G formula based on SCr of 1.25 mg/dL (H)). Liver Function Tests: No results for input(s): AST, ALT, ALKPHOS, BILITOT, PROT, ALBUMIN in the last 168 hours. No results for input(s): LIPASE, AMYLASE in the last 168 hours. No results for input(s): AMMONIA in the last 168 hours. Coagulation Profile: Recent Labs  Lab 12/02/17 0053  INR 1.08   Cardiac Enzymes: Recent Labs  Lab 12/02/17 0053 12/02/17 0906 12/02/17 1527  TROPONINI <0.03 <0.03 <0.03   BNP (last 3 results) No results for input(s): PROBNP in the last 8760 hours. HbA1C: No results for input(s): HGBA1C in the last 72 hours. CBG: No results for input(s): GLUCAP in the last 168 hours. Lipid Profile: No results for input(s): CHOL, HDL, LDLCALC, TRIG, CHOLHDL, LDLDIRECT in the last 72 hours. Thyroid Function Tests: No results for input(s): TSH, T4TOTAL, FREET4, T3FREE, THYROIDAB in the last 72 hours. Anemia Panel: No results for input(s): VITAMINB12, FOLATE, FERRITIN, TIBC, IRON, RETICCTPCT in the last 72 hours. Sepsis Labs: No results for input(s): PROCALCITON, LATICACIDVEN in the last 168 hours.  Recent Results (from the past 240 hour(s))  MRSA PCR Screening     Status: None   Collection Time: 12/02/17 12:17 AM  Result Value Ref Range Status   MRSA by PCR NEGATIVE NEGATIVE Final    Comment:        The GeneXpert MRSA Assay (FDA approved for NASAL specimens only), is one component of a comprehensive MRSA  colonization surveillance program. It is not intended to diagnose MRSA infection nor to guide or monitor treatment for MRSA infections. Performed at [facility], [place] [address] [place], [place] [address]      Radiology Studies: No results found.  Scheduled Meds: . aspirin  81 mg Oral Daily  . bisacodyl  5 mg Oral Once  . [START ON 12/07/2017] chlorhexidine  15 mL Mouth/Throat Once  . Chlorhexidine Gluconate Cloth  6 each Topical Once   And  . Chlorhexidine Gluconate Cloth  6 each Topical Once  . cholestyramine  4 g Oral QPC breakfast  . [START ON 12/07/2017] diazepam  2 mg Oral Once  . fluticasone  2 spray Each Nare QHS  . guaiFENesin  1,200 mg Oral BID  . [START ON 12/07/2017] heparin-papaverine-plasmalyte irrigation  Irrigation To OR  . loratadine  10 mg Oral Daily  . [START ON 12/07/2017] magnesium sulfate  40 mEq Other To OR  . metoprolol  200 mg Oral Daily  . [START ON 12/07/2017] metoprolol tartrate  12.5 mg Oral Once  . nitroGLYCERIN  0.5 inch Topical Q6H  . pantoprazole  40 mg Oral BID  . potassium chloride  10 mEq Oral QPM  . [START ON 12/07/2017] potassium chloride  80 mEq Other To OR  . pravastatin  40 mg Oral Daily  . sodium chloride flush  3 mL Intravenous Q12H  . [START ON 12/07/2017] tranexamic acid  15 mg/kg Intravenous To OR  . [START ON 12/07/2017] tranexamic acid  2 mg/kg Intracatheter To OR   Continuous Infusions: . sodium chloride Stopped (12/06/17 1056)  . acyclovir 166 mL/hr at 12/06/17 1100  . [START ON 12/07/2017] cefUROXime (ZINACEF)  IV    . [START ON 12/07/2017] cefUROXime (ZINACEF)  IV    . [START ON 12/07/2017] dexmedetomidine    . [START ON 12/07/2017] DOPamine    . [START ON 12/07/2017] epinephrine    . [START ON 12/07/2017] heparin 30,000 units/NS 1000 mL solution for CELLSAVER    . heparin 1,050 Units/hr (12/06/17 1100)  . [START ON 12/07/2017] insulin (NOVOLIN-R) infusion    . [START ON 12/07/2017] milrinone    . [START ON 12/07/2017]  nitroGLYCERIN    . [START ON 12/07/2017] phenylephrine 20mg /218mL NS (0.08mg /ml) infusion    . [START ON 12/07/2017] tranexamic acid (CYKLOKAPRON) infusion ([facility])    . [START ON 12/07/2017] vancomycin       LOS: 4 days   Marylu Lund, MD [facility] Pager [phone]  If 7PM-7AM, please contact night-coverage [email or web address] Password [email or web address] 12/06/2017, 1:00 PM

## 2017-12-06 NOTE — Progress Notes (Signed)
4 Days Post-Op Procedure(s) (LRB): LEFT HEART CATH AND CORONARY ANGIOGRAPHY (N/A) Subjective: No CP or SOB C/o pain right arm and new rash right shoulder Also c/o nasal congestion- requesting Zyrtec and mucinex  Objective: Vital signs in last 24 hours: Temp:  [97.1 F (36.2 C)-98.1 F (36.7 C)] 97.8 F (36.6 C) (06/25 0725) Pulse Rate:  [55-99] 57 (06/25 0725) Cardiac Rhythm: Sinus bradycardia;Heart block (06/25 0700) Resp:  [14-20] 15 (06/25 0725) BP: (89-147)/(37-77) 89/49 (06/25 0725) SpO2:  [92 %-97 %] 92 % (06/25 0725) Weight:  [197 lb 4.8 oz (89.5 kg)] 197 lb 4.8 oz (89.5 kg) (06/25 0500)  Hemodynamic parameters for last 24 hours:    Intake/Output from previous day: 06/24 0701 - 06/25 0700 In: 1396.5 [P.O.:840; I.V.:556.5] Out: 400 [Urine:400] Intake/Output this shift: No intake/output data recorded.  General appearance: alert, cooperative and no distress Neurologic: intact Heart: regular rate and rhythm Lungs: clear to auscultation bilaterally 2 cm diameter round erythematous rash right shoulder, no vesicles  Lab Results: Recent Labs    12/05/17 0306 12/06/17 0359  WBC 5.3 6.4  HGB 12.4* 12.9*  HCT 39.5 39.5  PLT 125* 131*   BMET: No results for input(s): NA, K, CL, CO2, GLUCOSE, BUN, CREATININE, CALCIUM in the last 72 hours.  PT/INR: No results for input(s): LABPROT, INR in the last 72 hours. ABG    Component Value Date/Time   PHART 7.448 07/31/2011 2046   HCO3 24.6 (H) 07/31/2011 2046   TCO2 25.7 07/31/2011 2046   O2SAT 96.8 07/31/2011 2046   CBG (last 3)  No results for input(s): GLUCAP in the last 72 hours.  Assessment/Plan: S/P Procedure(s) (LRB): LEFT HEART CATH AND CORONARY ANGIOGRAPHY (N/A) -Possible shingles right arm- has history of severe episode 2 years ago, now has a small rash and pain c/w prior sx. Will start IV acyclovir. He does not want gabapentin  Nasal congestion- antihistamines, mucinex  CAD- For CABG in AM- all questions  answered   LOS: 4 days    [place]rose Nakayama 12/06/2017

## 2017-12-06 NOTE — Progress Notes (Signed)
ANTICOAGULATION CONSULT NOTE   Pharmacy Consult for Heparin Indication: ACS, s/p cath, awaiting CABG  Patient Measurements: Height: 6' (182.9 cm) Weight: 197 lb 4.8 oz (89.5 kg) IBW/kg (Calculated) : 77.6 Heparin Dosing Weight: 91 kg  Vital Signs: Temp: 97.8 F (36.6 C) (06/25 0725) Temp Source: Oral (06/25 0725) BP: 89/49 (06/25 0725) Pulse Rate: 57 (06/25 0725)  Labs: Recent Labs    12/04/17 0301 12/05/17 0306 12/06/17 0359  HGB 13.7 12.4* 12.9*  HCT 41.1 39.5 39.5  PLT 114* 125* 131*  HEPARINUNFRC 0.54 0.62 0.50   Medical History: Past Medical History:  Diagnosis Date  . Allergy   . Anxiety   . Bladder cancer ([place]) 2010   bladder  . Cataract   . Chronic bronchitis ([place])    "yearly the last 3 yrs" (02/13/2013)  . Clotting disorder ([place])   . Exertional shortness of breath    "the last 6 months" (02/13/2013)  . GERD (gastroesophageal reflux disease)   . History of blood transfusion 1949   "seeral" (02/13/2013)  . HOH (hard of hearing)   . Hypertension   . Ocular migraine    "not often" (02/13/2013)  . Pneumonia    "last time ~ 2 yr ago; had it before that too" (02/13/2013)  . PONV (postoperative nausea and vomiting)   . Seasonal allergies   . Shingles    has neuropathy since it happened in 6/17  . Temporal arteritis [facility])    Assessment: 83 yo male now s/p cath which showed 2 vessel disease.  Planning to resume heparin as bridge to CABG. Planning CABG on 6/26. -heparin level is at goal  Goal of Therapy:  Heparin level 0.3-0.7 units/ml Monitor platelets by anticoagulation protocol: Yes   Plan: Cont heparin at 1050 units/hr Daily heparin level and CBC. CABG 6/26   Hildred Laser, PharmD Clinical Pharmacist Clinical phone from 8:30-4:00 is [phone] After 4pm, please call Main Rx [phone]) for assistance. 12/06/2017 9:47 AM

## 2017-12-06 NOTE — Progress Notes (Signed)
Pt declined walking. Visiting with family. Reviewed information with his niece and wife.  [place], ACSM 3:16 PM 12/06/2017

## 2017-12-06 NOTE — Progress Notes (Signed)
Attempted to have pt and wife watch video. Video on call down. Gave cardiac surgery booklet and went over how to use incentive spirometer

## 2017-12-06 NOTE — Plan of Care (Signed)
Continue current care plan 

## 2017-12-06 NOTE — Progress Notes (Signed)
PHARMACY NOTE:  ANTIMICROBIAL RENAL DOSAGE ADJUSTMENT  Current antimicrobial regimen includes a mismatch between antimicrobial dosage and estimated renal function.  As per policy approved by the Pharmacy & Therapeutics and Medical Executive Committees, the antimicrobial dosage will be adjusted accordingly.  Current antimicrobial dosage:  Acyclovir 10mg /kg IV q8h  Indication: possible shingles  Renal Function:  Estimated Creatinine Clearance: 50.9 mL/min (A) (by C-G formula based on SCr of 1.25 mg/dL (H)). []      On intermittent HD, scheduled: []      On CRRT    Antimicrobial dosage has been changed to:  Acyclovir 800mg  IV q12h  Additional comments: Dose is closer to 10mg /kg for AdjBW since patient is not obese; patient's renal function is borderline, but given age would assume true CrCl is <33mL/min   Thank you for allowing pharmacy to be a part of this patient's care.  Verna Czech, [facility] 12/06/2017 9:30 AM

## 2017-12-07 ENCOUNTER — Inpatient Hospital Stay (HOSPITAL_COMMUNITY)
Admission: EM | Disposition: A | Discharge status: Home / Self Care | Payer: Self-pay | Source: Home / Self Care | Attending: Thoracic Surgery (Cardiothoracic Vascular Surgery)

## 2017-12-07 ENCOUNTER — Inpatient Hospital Stay (HOSPITAL_COMMUNITY): Payer: PPO | Admitting: Anesthesiology

## 2017-12-07 ENCOUNTER — Inpatient Hospital Stay (HOSPITAL_COMMUNITY): Payer: PPO

## 2017-12-07 ENCOUNTER — Other Ambulatory Visit: Payer: Self-pay

## 2017-12-07 DIAGNOSIS — Z951 Presence of aortocoronary bypass graft: Secondary | ICD-10-CM

## 2017-12-07 HISTORY — PX: TEE WITHOUT CARDIOVERSION: SHX5443

## 2017-12-07 HISTORY — PX: CORONARY ARTERY BYPASS GRAFT: SHX141

## 2017-12-07 LAB — POCT I-STAT 3, ART BLOOD GAS (G3+)
Acid-base deficit: 5 mmol/L — ABNORMAL HIGH (ref 0.0–2.0)
Acid-base deficit: 4 mmol/L — ABNORMAL HIGH (ref 0.0–2.0)
Acid-base deficit: 6 mmol/L — ABNORMAL HIGH (ref 0.0–2.0)
Bicarbonate: 24.5 mmol/L (ref 20.0–28.0)
Bicarbonate: 20.4 mmol/L (ref 20.0–28.0)
Bicarbonate: 20.8 mmol/L (ref 20.0–28.0)
Bicarbonate: 18.6 mmol/L — ABNORMAL LOW (ref 20.0–28.0)
O2 Saturation: 100 %
O2 Saturation: 99 %
O2 Saturation: 97 %
O2 Saturation: 98 %
Patient temperature: 35.8
Patient temperature: 36.8
Patient temperature: 37
TCO2: 26 mmol/L (ref 22–32)
TCO2: 22 mmol/L (ref 22–32)
TCO2: 22 mmol/L (ref 22–32)
TCO2: 20 mmol/L — ABNORMAL LOW (ref 22–32)
pCO2 arterial: 40 mmHg (ref 32.0–48.0)
pCO2 arterial: 35.1 mmHg (ref 32.0–48.0)
pCO2 arterial: 35.4 mmHg (ref 32.0–48.0)
pCO2 arterial: 32.4 mmHg (ref 32.0–48.0)
pH, Arterial: 7.394 (ref 7.350–7.450)
pH, Arterial: 7.367 (ref 7.350–7.450)
pH, Arterial: 7.377 (ref 7.350–7.450)
pH, Arterial: 7.366 (ref 7.350–7.450)
pO2, Arterial: 418 mmHg — ABNORMAL HIGH (ref 83.0–108.0)
pO2, Arterial: 118 mmHg — ABNORMAL HIGH (ref 83.0–108.0)
pO2, Arterial: 91 mmHg (ref 83.0–108.0)
pO2, Arterial: 104 mmHg (ref 83.0–108.0)

## 2017-12-07 LAB — GLUCOSE, CAPILLARY
Glucose-Capillary: 137 mg/dL — ABNORMAL HIGH (ref 70–99)
Glucose-Capillary: 150 mg/dL — ABNORMAL HIGH (ref 70–99)
Glucose-Capillary: 164 mg/dL — ABNORMAL HIGH (ref 70–99)
Glucose-Capillary: 161 mg/dL — ABNORMAL HIGH (ref 70–99)
Glucose-Capillary: 146 mg/dL — ABNORMAL HIGH (ref 70–99)
Glucose-Capillary: 139 mg/dL — ABNORMAL HIGH (ref 70–99)
Glucose-Capillary: 151 mg/dL — ABNORMAL HIGH (ref 70–99)
Glucose-Capillary: 105 mg/dL — ABNORMAL HIGH (ref 70–99)

## 2017-12-07 LAB — POCT I-STAT, CHEM 8
BUN: 14 mg/dL (ref 8–23)
BUN: 12 mg/dL (ref 8–23)
BUN: 14 mg/dL (ref 8–23)
BUN: 13 mg/dL (ref 8–23)
BUN: 13 mg/dL (ref 8–23)
BUN: 11 mg/dL (ref 8–23)
Calcium, Ion: 1.25 mmol/L (ref 1.15–1.40)
Calcium, Ion: 1.01 mmol/L — ABNORMAL LOW (ref 1.15–1.40)
Calcium, Ion: 1.2 mmol/L (ref 1.15–1.40)
Calcium, Ion: 1.01 mmol/L — ABNORMAL LOW (ref 1.15–1.40)
Calcium, Ion: 0.99 mmol/L — ABNORMAL LOW (ref 1.15–1.40)
Calcium, Ion: 1.15 mmol/L (ref 1.15–1.40)
Chloride: 104 mmol/L (ref 98–111)
Chloride: 104 mmol/L (ref 98–111)
Chloride: 99 mmol/L (ref 98–111)
Chloride: 100 mmol/L (ref 98–111)
Chloride: 102 mmol/L (ref 98–111)
Chloride: 102 mmol/L (ref 98–111)
Creatinine, Ser: 1.2 mg/dL (ref 0.61–1.24)
Creatinine, Ser: 1.1 mg/dL (ref 0.61–1.24)
Creatinine, Ser: 1.1 mg/dL (ref 0.61–1.24)
Creatinine, Ser: 1.1 mg/dL (ref 0.61–1.24)
Creatinine, Ser: 1 mg/dL (ref 0.61–1.24)
Creatinine, Ser: 1.1 mg/dL (ref 0.61–1.24)
Glucose, Bld: 90 mg/dL (ref 70–99)
Glucose, Bld: 102 mg/dL — ABNORMAL HIGH (ref 70–99)
Glucose, Bld: 92 mg/dL (ref 70–99)
Glucose, Bld: 135 mg/dL — ABNORMAL HIGH (ref 70–99)
Glucose, Bld: 129 mg/dL — ABNORMAL HIGH (ref 70–99)
Glucose, Bld: 149 mg/dL — ABNORMAL HIGH (ref 70–99)
HCT: 35 % — ABNORMAL LOW (ref 39.0–52.0)
HCT: 26 % — ABNORMAL LOW (ref 39.0–52.0)
HCT: 33 % — ABNORMAL LOW (ref 39.0–52.0)
HCT: 21 % — ABNORMAL LOW (ref 39.0–52.0)
HCT: 24 % — ABNORMAL LOW (ref 39.0–52.0)
HCT: 27 % — ABNORMAL LOW (ref 39.0–52.0)
Hemoglobin: 11.9 g/dL — ABNORMAL LOW (ref 13.0–17.0)
Hemoglobin: 11.2 g/dL — ABNORMAL LOW (ref 13.0–17.0)
Hemoglobin: 8.8 g/dL — ABNORMAL LOW (ref 13.0–17.0)
Hemoglobin: 7.1 g/dL — ABNORMAL LOW (ref 13.0–17.0)
Hemoglobin: 8.2 g/dL — ABNORMAL LOW (ref 13.0–17.0)
Hemoglobin: 9.2 g/dL — ABNORMAL LOW (ref 13.0–17.0)
Potassium: 3.8 mmol/L (ref 3.5–5.1)
Potassium: 4.2 mmol/L (ref 3.5–5.1)
Potassium: 4.2 mmol/L (ref 3.5–5.1)
Potassium: 4.9 mmol/L (ref 3.5–5.1)
Potassium: 4.4 mmol/L (ref 3.5–5.1)
Potassium: 3.8 mmol/L (ref 3.5–5.1)
Sodium: 140 mmol/L (ref 135–145)
Sodium: 140 mmol/L (ref 135–145)
Sodium: 142 mmol/L (ref 135–145)
Sodium: 136 mmol/L (ref 135–145)
Sodium: 139 mmol/L (ref 135–145)
Sodium: 139 mmol/L (ref 135–145)
TCO2: 25 mmol/L (ref 22–32)
TCO2: 23 mmol/L (ref 22–32)
TCO2: 25 mmol/L (ref 22–32)
TCO2: 23 mmol/L (ref 22–32)
TCO2: 22 mmol/L (ref 22–32)
TCO2: 22 mmol/L (ref 22–32)

## 2017-12-07 LAB — BASIC METABOLIC PANEL
Anion gap: 7 (ref 5–15)
BUN: 15 mg/dL (ref 8–23)
CO2: 27 mmol/L (ref 22–32)
Calcium: 9.1 mg/dL (ref 8.9–10.3)
Chloride: 106 mmol/L (ref 98–111)
Creatinine, Ser: 1.52 mg/dL — ABNORMAL HIGH (ref 0.61–1.24)
GFR calc Af Amer: 48 mL/min — ABNORMAL LOW (ref >60)
GFR calc non Af Amer: 41 mL/min — ABNORMAL LOW (ref >60)
Glucose, Bld: 86 mg/dL (ref 70–99)
Potassium: 3.9 mmol/L (ref 3.5–5.1)
Sodium: 140 mmol/L (ref 135–145)

## 2017-12-07 LAB — PLATELET COUNT: Platelets: 93 10*3/uL — ABNORMAL LOW (ref 150–400)

## 2017-12-07 LAB — HEPARIN LEVEL (UNFRACTIONATED): Heparin Unfractionated: 0.53 IU/mL (ref 0.30–0.70)

## 2017-12-07 LAB — CBC
HCT: 41.7 % (ref 39.0–52.0)
HCT: 31.5 % — ABNORMAL LOW (ref 39.0–52.0)
HCT: 30.8 % — ABNORMAL LOW (ref 39.0–52.0)
Hemoglobin: 13.8 g/dL (ref 13.0–17.0)
Hemoglobin: 10.5 g/dL — ABNORMAL LOW (ref 13.0–17.0)
Hemoglobin: 10.3 g/dL — ABNORMAL LOW (ref 13.0–17.0)
MCH: 30.7 pg (ref 26.0–34.0)
MCH: 30.8 pg (ref 26.0–34.0)
MCH: 31 pg (ref 26.0–34.0)
MCHC: 33.1 g/dL (ref 30.0–36.0)
MCHC: 33.3 g/dL (ref 30.0–36.0)
MCHC: 33.4 g/dL (ref 30.0–36.0)
MCV: 92.7 fL (ref 78.0–100.0)
MCV: 92.4 fL (ref 78.0–100.0)
MCV: 92.8 fL (ref 78.0–100.0)
Platelets: 133 10*3/uL — ABNORMAL LOW (ref 150–400)
Platelets: 81 10*3/uL — ABNORMAL LOW (ref 150–400)
Platelets: 109 10*3/uL — ABNORMAL LOW (ref 150–400)
RBC: 4.5 MIL/uL (ref 4.22–5.81)
RBC: 3.41 MIL/uL — ABNORMAL LOW (ref 4.22–5.81)
RBC: 3.32 MIL/uL — ABNORMAL LOW (ref 4.22–5.81)
RDW: 14.1 % (ref 11.5–15.5)
RDW: 14.1 % (ref 11.5–15.5)
RDW: 14.1 % (ref 11.5–15.5)
WBC: 6.5 10*3/uL (ref 4.0–10.5)
WBC: 10.7 10*3/uL — ABNORMAL HIGH (ref 4.0–10.5)
WBC: 15.7 10*3/uL — ABNORMAL HIGH (ref 4.0–10.5)

## 2017-12-07 LAB — CREATININE, SERUM
Creatinine, Ser: 1.24 mg/dL (ref 0.61–1.24)
GFR calc Af Amer: >60 mL/min (ref >60)
GFR calc non Af Amer: 53 mL/min — ABNORMAL LOW (ref >60)

## 2017-12-07 LAB — PROTIME-INR
INR: 1.42
Prothrombin Time: 17.2 seconds — ABNORMAL HIGH (ref 11.4–15.2)

## 2017-12-07 LAB — PREPARE RBC (CROSSMATCH)

## 2017-12-07 LAB — MAGNESIUM: Magnesium: 3.1 mg/dL — ABNORMAL HIGH (ref 1.7–2.4)

## 2017-12-07 LAB — APTT: aPTT: 33 seconds (ref 24–36)

## 2017-12-07 LAB — HEMOGLOBIN AND HEMATOCRIT, BLOOD
HCT: 23 % — ABNORMAL LOW (ref 39.0–52.0)
Hemoglobin: 7.8 g/dL — ABNORMAL LOW (ref 13.0–17.0)

## 2017-12-07 SURGERY — CORONARY ARTERY BYPASS GRAFTING (CABG)
Anesthesia: General | Site: Chest

## 2017-12-07 MED ORDER — ACETAMINOPHEN 500 MG PO TABS
1000.0000 mg | ORAL_TABLET | Freq: Four times a day (QID) | ORAL | Status: AC
  Administered 2017-12-09 – 2017-12-12 (×16): 1000 mg via ORAL
  Filled 2017-12-07 (×17): qty 2

## 2017-12-07 MED ORDER — SODIUM CHLORIDE 0.45 % IV SOLN
INTRAVENOUS | Status: DC | PRN
  Administered 2017-12-07: 20 mL/h via INTRAVENOUS

## 2017-12-07 MED ORDER — SODIUM CHLORIDE 0.9 % IV SOLN
0.0000 ug/min | INTRAVENOUS | Status: DC
  Administered 2017-12-07: 50 ug/min via INTRAVENOUS
  Administered 2017-12-08: 40 ug/min via INTRAVENOUS
  Filled 2017-12-07 (×2): qty 20
  Filled 2017-12-07: qty 2

## 2017-12-07 MED ORDER — MORPHINE SULFATE (PF) 2 MG/ML IV SOLN
1.0000 mg | INTRAVENOUS | Status: AC | PRN
  Filled 2017-12-07: qty 2

## 2017-12-07 MED ORDER — SODIUM CHLORIDE 0.9 % IV SOLN
1.5000 g | Freq: Two times a day (BID) | INTRAVENOUS | Status: AC
  Administered 2017-12-07 – 2017-12-09 (×4): 1.5 g via INTRAVENOUS
  Filled 2017-12-07 (×4): qty 1.5

## 2017-12-07 MED ORDER — MIDAZOLAM HCL 2 MG/2ML IJ SOLN
INTRAMUSCULAR | Status: AC
  Filled 2017-12-07: qty 2

## 2017-12-07 MED ORDER — PANTOPRAZOLE SODIUM 40 MG PO TBEC: 40.0000 mg | DELAYED_RELEASE_TABLET | Freq: Every day | ORAL | Status: DC

## 2017-12-07 MED ORDER — LIDOCAINE 2% (20 MG/ML) 5 ML SYRINGE
INTRAMUSCULAR | Status: AC
  Filled 2017-12-07: qty 5

## 2017-12-07 MED ORDER — METOPROLOL TARTRATE 12.5 MG HALF TABLET
12.5000 mg | ORAL_TABLET | Freq: Two times a day (BID) | ORAL | Status: DC
  Administered 2017-12-09 – 2017-12-11 (×6): 12.5 mg via ORAL
  Filled 2017-12-07 (×7): qty 1

## 2017-12-07 MED ORDER — BISACODYL 5 MG PO TBEC
10.0000 mg | DELAYED_RELEASE_TABLET | Freq: Every day | ORAL | Status: DC
  Administered 2017-12-08 – 2017-12-11 (×4): 10 mg via ORAL
  Filled 2017-12-07 (×5): qty 2

## 2017-12-07 MED ORDER — HEPARIN SODIUM (PORCINE) 1000 UNIT/ML IJ SOLN
INTRAMUSCULAR | Status: AC
  Filled 2017-12-07: qty 1

## 2017-12-07 MED ORDER — CHLORHEXIDINE GLUCONATE CLOTH 2 % EX PADS
6.0000 | MEDICATED_PAD | Freq: Every day | CUTANEOUS | Status: DC
  Administered 2017-12-07 – 2017-12-08 (×2): 6 via TOPICAL

## 2017-12-07 MED ORDER — PHENYLEPHRINE 40 MCG/ML (10ML) SYRINGE FOR IV PUSH (FOR BLOOD PRESSURE SUPPORT)
PREFILLED_SYRINGE | INTRAVENOUS | Status: AC
  Filled 2017-12-07: qty 10

## 2017-12-07 MED ORDER — VECURONIUM BROMIDE 10 MG IV SOLR
INTRAVENOUS | Status: DC | PRN
  Administered 2017-12-07: 5 mg via INTRAVENOUS

## 2017-12-07 MED ORDER — SODIUM CHLORIDE 0.9 % IV SOLN
INTRAVENOUS | Status: DC
  Administered 2017-12-07: 20:00:00 via INTRAVENOUS
  Administered 2017-12-07: 20 mL/h via INTRAVENOUS

## 2017-12-07 MED ORDER — TRAMADOL HCL 50 MG PO TABS: 50.0000 mg | ORAL_TABLET | ORAL | Status: DC | PRN

## 2017-12-07 MED ORDER — ASPIRIN 81 MG PO CHEW
324.0000 mg | CHEWABLE_TABLET | Freq: Every day | ORAL | Status: DC
  Filled 2017-12-07: qty 4

## 2017-12-07 MED ORDER — LACTATED RINGERS IV SOLN
INTRAVENOUS | Status: DC | PRN
  Administered 2017-12-07: 07:00:00 via INTRAVENOUS

## 2017-12-07 MED ORDER — FAMOTIDINE IN NACL 20-0.9 MG/50ML-% IV SOLN
20.0000 mg | Freq: Two times a day (BID) | INTRAVENOUS | Status: AC
  Administered 2017-12-07 – 2017-12-08 (×2): 20 mg via INTRAVENOUS
  Filled 2017-12-07: qty 50

## 2017-12-07 MED ORDER — ROCURONIUM BROMIDE 100 MG/10ML IV SOLN
INTRAVENOUS | Status: DC | PRN
  Administered 2017-12-07: 50 mg via INTRAVENOUS
  Administered 2017-12-07: 100 mg via INTRAVENOUS
  Administered 2017-12-07: 50 mg via INTRAVENOUS

## 2017-12-07 MED ORDER — PHENYLEPHRINE HCL 10 MG/ML IJ SOLN
INTRAMUSCULAR | Status: DC | PRN
  Administered 2017-12-07 (×4): 80 ug via INTRAVENOUS

## 2017-12-07 MED ORDER — ACETAMINOPHEN 160 MG/5ML PO SOLN: 650.0000 mg | Freq: Once | ORAL | Status: AC

## 2017-12-07 MED ORDER — DOCUSATE SODIUM 100 MG PO CAPS
200.0000 mg | ORAL_CAPSULE | Freq: Every day | ORAL | Status: DC
  Administered 2017-12-08 – 2017-12-11 (×4): 200 mg via ORAL
  Filled 2017-12-07 (×5): qty 2

## 2017-12-07 MED ORDER — MIDAZOLAM HCL 5 MG/5ML IJ SOLN
INTRAMUSCULAR | Status: DC | PRN
  Administered 2017-12-07 (×4): 2 mg via INTRAVENOUS
  Administered 2017-12-07: 4 mg via INTRAVENOUS

## 2017-12-07 MED ORDER — METOPROLOL TARTRATE 25 MG/10 ML ORAL SUSPENSION: 12.5000 mg | Freq: Two times a day (BID) | ORAL | Status: DC

## 2017-12-07 MED ORDER — OXYCODONE HCL 5 MG PO TABS: 5.0000 mg | ORAL_TABLET | ORAL | Status: DC | PRN

## 2017-12-07 MED ORDER — FENTANYL CITRATE (PF) 250 MCG/5ML IJ SOLN
INTRAMUSCULAR | Status: DC | PRN
  Administered 2017-12-07: 50 ug via INTRAVENOUS
  Administered 2017-12-07: 100 ug via INTRAVENOUS
  Administered 2017-12-07 (×2): 50 ug via INTRAVENOUS
  Administered 2017-12-07: 150 ug via INTRAVENOUS
  Administered 2017-12-07: 100 ug via INTRAVENOUS
  Administered 2017-12-07: 50 ug via INTRAVENOUS
  Administered 2017-12-07: 250 ug via INTRAVENOUS
  Administered 2017-12-07: 200 ug via INTRAVENOUS

## 2017-12-07 MED ORDER — NITROGLYCERIN IN D5W 200-5 MCG/ML-% IV SOLN: 0.0000 ug/min | INTRAVENOUS | Status: DC

## 2017-12-07 MED ORDER — POTASSIUM CHLORIDE 10 MEQ/50ML IV SOLN: 10.0000 meq | INTRAVENOUS | Status: AC

## 2017-12-07 MED ORDER — ALBUMIN HUMAN 5 % IV SOLN
INTRAVENOUS | Status: DC | PRN
  Administered 2017-12-07 (×3): via INTRAVENOUS

## 2017-12-07 MED ORDER — METOCLOPRAMIDE HCL 5 MG/ML IJ SOLN
10.0000 mg | Freq: Four times a day (QID) | INTRAMUSCULAR | Status: AC
  Administered 2017-12-07 – 2017-12-08 (×4): 10 mg via INTRAVENOUS
  Filled 2017-12-07 (×3): qty 2

## 2017-12-07 MED ORDER — BISACODYL 10 MG RE SUPP: 10.0000 mg | Freq: Every day | RECTAL | Status: DC

## 2017-12-07 MED ORDER — ONDANSETRON HCL 4 MG/2ML IJ SOLN
4.0000 mg | Freq: Four times a day (QID) | INTRAMUSCULAR | Status: DC | PRN
  Administered 2017-12-08: 4 mg via INTRAVENOUS
  Filled 2017-12-07: qty 2

## 2017-12-07 MED ORDER — VECURONIUM BROMIDE 10 MG IV SOLR
INTRAVENOUS | Status: AC
  Filled 2017-12-07: qty 10

## 2017-12-07 MED ORDER — DEXMEDETOMIDINE HCL IN NACL 400 MCG/100ML IV SOLN
0.0000 ug/kg/h | INTRAVENOUS | Status: DC
  Administered 2017-12-07: 0.7 ug/kg/h via INTRAVENOUS
  Administered 2017-12-08: 0.2 ug/kg/h via INTRAVENOUS

## 2017-12-07 MED ORDER — MIDAZOLAM HCL 10 MG/2ML IJ SOLN
INTRAMUSCULAR | Status: AC
  Filled 2017-12-07: qty 2

## 2017-12-07 MED ORDER — CHLORHEXIDINE GLUCONATE 0.12 % MT SOLN
15.0000 mL | OROMUCOSAL | Status: AC
  Administered 2017-12-07: 15 mL via OROMUCOSAL

## 2017-12-07 MED ORDER — PROTAMINE SULFATE 10 MG/ML IV SOLN
INTRAVENOUS | Status: AC
  Filled 2017-12-07: qty 10

## 2017-12-07 MED ORDER — SODIUM CHLORIDE 0.9% FLUSH
3.0000 mL | Freq: Two times a day (BID) | INTRAVENOUS | Status: DC
Start: 2017-12-08 — End: 2017-12-07

## 2017-12-07 MED ORDER — LACTATED RINGERS IV SOLN
500.0000 mL | Freq: Once | INTRAVENOUS | Status: AC | PRN
  Administered 2017-12-07: 500 mL via INTRAVENOUS

## 2017-12-07 MED ORDER — LIDOCAINE 2% (20 MG/ML) 5 ML SYRINGE
INTRAMUSCULAR | Status: DC | PRN
  Administered 2017-12-07: 100 mg via INTRAVENOUS

## 2017-12-07 MED ORDER — SODIUM CHLORIDE 0.9 % IV SOLN
INTRAVENOUS | Status: DC
  Administered 2017-12-08: 2.7 [IU]/h via INTRAVENOUS
  Filled 2017-12-07: qty 1

## 2017-12-07 MED ORDER — ORAL CARE MOUTH RINSE
15.0000 mL | Freq: Two times a day (BID) | OROMUCOSAL | Status: DC
  Administered 2017-12-08 – 2017-12-13 (×7): 15 mL via OROMUCOSAL

## 2017-12-07 MED ORDER — SODIUM CHLORIDE 0.9% FLUSH: 10.0000 mL | INTRAVENOUS | Status: DC | PRN

## 2017-12-07 MED ORDER — CALCIUM CHLORIDE 10 % IV SOLN
1.0000 g | Freq: Once | INTRAVENOUS | Status: AC
  Administered 2017-12-07: 1 g via INTRAVENOUS

## 2017-12-07 MED ORDER — MAGNESIUM SULFATE 4 GM/100ML IV SOLN
4.0000 g | Freq: Once | INTRAVENOUS | Status: AC
  Administered 2017-12-07: 4 g via INTRAVENOUS
  Filled 2017-12-07: qty 100

## 2017-12-07 MED ORDER — MIDAZOLAM HCL 2 MG/2ML IJ SOLN: 2.0000 mg | INTRAMUSCULAR | Status: DC | PRN

## 2017-12-07 MED ORDER — PROTAMINE SULFATE 10 MG/ML IV SOLN
INTRAVENOUS | Status: AC
  Filled 2017-12-07: qty 50

## 2017-12-07 MED ORDER — INSULIN REGULAR BOLUS VIA INFUSION
0.0000 [IU] | Freq: Three times a day (TID) | INTRAVENOUS | Status: DC
  Filled 2017-12-07: qty 10

## 2017-12-07 MED ORDER — SODIUM BICARBONATE 8.4 % IV SOLN
50.0000 meq | Freq: Once | INTRAVENOUS | Status: AC
  Administered 2017-12-07: 50 meq via INTRAVENOUS

## 2017-12-07 MED ORDER — ROCURONIUM BROMIDE 50 MG/5ML IV SOLN
INTRAVENOUS | Status: AC
  Filled 2017-12-07: qty 3

## 2017-12-07 MED ORDER — ALBUMIN HUMAN 5 % IV SOLN
250.0000 mL | INTRAVENOUS | Status: AC | PRN
  Administered 2017-12-07 – 2017-12-08 (×3): 250 mL via INTRAVENOUS
  Filled 2017-12-07: qty 250

## 2017-12-07 MED ORDER — HEPARIN SODIUM (PORCINE) 1000 UNIT/ML IJ SOLN
INTRAMUSCULAR | Status: DC | PRN
  Administered 2017-12-07: 2000 [IU] via INTRAVENOUS
  Administered 2017-12-07: 28000 [IU] via INTRAVENOUS

## 2017-12-07 MED ORDER — ARTIFICIAL TEARS OPHTHALMIC OINT
TOPICAL_OINTMENT | OPHTHALMIC | Status: DC | PRN
  Administered 2017-12-07: 1 via OPHTHALMIC

## 2017-12-07 MED ORDER — LACTATED RINGERS IV SOLN: INTRAVENOUS | Status: DC

## 2017-12-07 MED ORDER — ORAL CARE MOUTH RINSE
15.0000 mL | OROMUCOSAL | Status: DC
  Administered 2017-12-07: 15 mL via OROMUCOSAL

## 2017-12-07 MED ORDER — CHLORHEXIDINE GLUCONATE 0.12% ORAL RINSE (MEDLINE KIT): 15.0000 mL | Freq: Two times a day (BID) | OROMUCOSAL | Status: DC

## 2017-12-07 MED ORDER — ACETAMINOPHEN 160 MG/5ML PO SOLN
1000.0000 mg | Freq: Four times a day (QID) | ORAL | Status: AC
  Administered 2017-12-08 (×2): 1000 mg
  Filled 2017-12-07 (×2): qty 40.6

## 2017-12-07 MED ORDER — FENTANYL CITRATE (PF) 250 MCG/5ML IJ SOLN
INTRAMUSCULAR | Status: AC
  Filled 2017-12-07: qty 25

## 2017-12-07 MED ORDER — HEMOSTATIC AGENTS (NO CHARGE) OPTIME
TOPICAL | Status: DC | PRN
  Administered 2017-12-07 (×2): 1 via TOPICAL

## 2017-12-07 MED ORDER — DOPAMINE-DEXTROSE 3.2-5 MG/ML-% IV SOLN
3.0000 ug/kg/min | INTRAVENOUS | Status: AC
  Administered 2017-12-07: 3 ug/kg/min via INTRAVENOUS

## 2017-12-07 MED ORDER — SODIUM CHLORIDE 0.9% FLUSH
10.0000 mL | Freq: Two times a day (BID) | INTRAVENOUS | Status: DC
  Administered 2017-12-07 – 2017-12-08 (×3): 10 mL

## 2017-12-07 MED ORDER — PROTAMINE SULFATE 10 MG/ML IV SOLN
INTRAVENOUS | Status: DC | PRN
  Administered 2017-12-07: 50 mg via INTRAVENOUS
  Administered 2017-12-07: 40 mg via INTRAVENOUS
  Administered 2017-12-07 (×2): 30 mg via INTRAVENOUS
  Administered 2017-12-07: 100 mg via INTRAVENOUS
  Administered 2017-12-07: 50 mg via INTRAVENOUS

## 2017-12-07 MED ORDER — SODIUM CHLORIDE 0.9% FLUSH: 3.0000 mL | INTRAVENOUS | Status: DC | PRN

## 2017-12-07 MED ORDER — ACETAMINOPHEN 650 MG RE SUPP
650.0000 mg | Freq: Once | RECTAL | Status: AC
  Administered 2017-12-07: 650 mg via RECTAL

## 2017-12-07 MED ORDER — DEXTROSE 5 % IV SOLN
800.0000 mg | Freq: Three times a day (TID) | INTRAVENOUS | Status: DC
  Administered 2017-12-07 – 2017-12-08 (×4): 800 mg via INTRAVENOUS
  Filled 2017-12-07 (×6): qty 16

## 2017-12-07 MED ORDER — LACTATED RINGERS IV SOLN
INTRAVENOUS | Status: DC
  Administered 2017-12-08: 16:00:00 via INTRAVENOUS

## 2017-12-07 MED ORDER — PHENYLEPHRINE HCL 10 MG/ML IJ SOLN
INTRAMUSCULAR | Status: AC
  Filled 2017-12-07: qty 2

## 2017-12-07 MED ORDER — SODIUM CHLORIDE 0.9 % IJ SOLN
INTRAMUSCULAR | Status: AC
  Filled 2017-12-07: qty 10

## 2017-12-07 MED ORDER — METOPROLOL TARTRATE 5 MG/5ML IV SOLN: 2.5000 mg | INTRAVENOUS | Status: DC | PRN

## 2017-12-07 MED ORDER — MORPHINE SULFATE (PF) 2 MG/ML IV SOLN
2.0000 mg | INTRAVENOUS | Status: DC | PRN
  Administered 2017-12-07: 4 mg via INTRAVENOUS
  Administered 2017-12-08: 2 mg via INTRAVENOUS
  Administered 2017-12-08: 5 mg via INTRAVENOUS
  Administered 2017-12-08: 2 mg via INTRAVENOUS
  Filled 2017-12-07: qty 2
  Filled 2017-12-07: qty 3
  Filled 2017-12-07: qty 1
  Filled 2017-12-07: qty 2

## 2017-12-07 MED ORDER — EPHEDRINE SULFATE 50 MG/ML IJ SOLN
INTRAMUSCULAR | Status: AC
  Filled 2017-12-07: qty 1

## 2017-12-07 MED ORDER — PROPOFOL 10 MG/ML IV BOLUS
INTRAVENOUS | Status: DC | PRN
  Administered 2017-12-07: 50 mg via INTRAVENOUS

## 2017-12-07 MED ORDER — ASPIRIN EC 325 MG PO TBEC
325.0000 mg | DELAYED_RELEASE_TABLET | Freq: Every day | ORAL | Status: DC
  Administered 2017-12-08 – 2017-12-13 (×5): 325 mg via ORAL
  Filled 2017-12-07 (×6): qty 1

## 2017-12-07 MED ORDER — ONDANSETRON HCL 4 MG/2ML IJ SOLN
INTRAMUSCULAR | Status: AC
  Filled 2017-12-07: qty 2

## 2017-12-07 MED ORDER — PROPOFOL 10 MG/ML IV BOLUS
INTRAVENOUS | Status: AC
  Filled 2017-12-07: qty 20

## 2017-12-07 MED ORDER — SODIUM CHLORIDE 0.9 % IV SOLN: 250.0000 mL | INTRAVENOUS | Status: DC

## 2017-12-07 MED ORDER — VANCOMYCIN HCL IN DEXTROSE 1-5 GM/200ML-% IV SOLN
1000.0000 mg | Freq: Once | INTRAVENOUS | Status: AC
  Administered 2017-12-07: 1000 mg via INTRAVENOUS
  Filled 2017-12-07: qty 200

## 2017-12-07 SURGICAL SUPPLY — 85 items
ADAPTER CARDIO PERF ANTE/RETRO (ADAPTER) ×1 IMPLANT
ADH SKN CLS APL DERMABOND .7 (GAUZE/BANDAGES/DRESSINGS) ×2
ADPR PRFSN 84XANTGRD RTRGD (ADAPTER) ×2
BAG DECANTER FOR FLEXI CONT (MISCELLANEOUS) ×3 IMPLANT
BANDAGE ACE 4X5 VEL STRL LF (GAUZE/BANDAGES/DRESSINGS) ×3 IMPLANT
BANDAGE ACE 6X5 VEL STRL LF (GAUZE/BANDAGES/DRESSINGS) ×3 IMPLANT
BASKET HEART (ORDER IN 25'S) (MISCELLANEOUS) ×1
BASKET HEART (ORDER IN 25S) (MISCELLANEOUS) ×2 IMPLANT
BLADE STERNUM SYSTEM 6 (BLADE) ×3 IMPLANT
BLADE SURG 11 STRL SS (BLADE) ×1 IMPLANT
BNDG GAUZE ELAST 4 BULKY (GAUZE/BANDAGES/DRESSINGS) ×3 IMPLANT
CANISTER SUCT 3000ML PPV (MISCELLANEOUS) ×3 IMPLANT
CANNULA EZ GLIDE AORTIC 21FR (CANNULA) ×3 IMPLANT
CANNULA GUNDRY RCSP 15FR (MISCELLANEOUS) ×1 IMPLANT
CATH CPB KIT HENDRICKSON (MISCELLANEOUS) ×3 IMPLANT
CATH ROBINSON RED A/P 18FR (CATHETERS) ×4 IMPLANT
CATH THORACIC 36FR (CATHETERS) ×3 IMPLANT
CATH THORACIC 36FR RT ANG (CATHETERS) ×3 IMPLANT
CRADLE DONUT ADULT HEAD (MISCELLANEOUS) ×3 IMPLANT
DERMABOND ADVANCED (GAUZE/BANDAGES/DRESSINGS) ×1
DERMABOND ADVANCED .7 DNX12 (GAUZE/BANDAGES/DRESSINGS) IMPLANT
DRAPE CARDIOVASCULAR INCISE (DRAPES) ×3
DRAPE SRG 135X102X78XABS (DRAPES) ×2 IMPLANT
DRSG AQUACEL AG ADV 3.5X14 (GAUZE/BANDAGES/DRESSINGS) ×1 IMPLANT
DRSG COVADERM 4X10 (GAUZE/BANDAGES/DRESSINGS) ×1 IMPLANT
DRSG COVADERM 4X14 (GAUZE/BANDAGES/DRESSINGS) ×3 IMPLANT
ELECT REM PT RETURN 9FT ADLT (ELECTROSURGICAL) ×6
ELECTRODE REM PT RTRN 9FT ADLT (ELECTROSURGICAL) ×4 IMPLANT
FELT TEFLON 1X6 (MISCELLANEOUS) ×6 IMPLANT
GAUZE SPONGE 4X4 12PLY STRL (GAUZE/BANDAGES/DRESSINGS) ×5 IMPLANT
GLOVE SURG SIGNA 7.5 PF LTX (GLOVE) ×9 IMPLANT
GOWN STRL REUS W/ TWL LRG LVL3 (GOWN DISPOSABLE) ×8 IMPLANT
GOWN STRL REUS W/ TWL XL LVL3 (GOWN DISPOSABLE) ×4 IMPLANT
GOWN STRL REUS W/TWL LRG LVL3 (GOWN DISPOSABLE) ×18
GOWN STRL REUS W/TWL XL LVL3 (GOWN DISPOSABLE) ×6
HEMOSTAT ARISTA ABSORB 3G PWDR (MISCELLANEOUS) ×1 IMPLANT
HEMOSTAT POWDER SURGIFOAM 1G (HEMOSTASIS) ×9 IMPLANT
HEMOSTAT SURGICEL 2X14 (HEMOSTASIS) ×3 IMPLANT
KIT BASIN OR (CUSTOM PROCEDURE TRAY) ×3 IMPLANT
KIT SUCTION CATH 14FR (SUCTIONS) ×6 IMPLANT
KIT TURNOVER KIT B (KITS) ×3 IMPLANT
KIT VASOVIEW HEMOPRO VH 3000 (KITS) ×3 IMPLANT
MARKER GRAFT CORONARY BYPASS (MISCELLANEOUS) ×9 IMPLANT
NS IRRIG 1000ML POUR BTL (IV SOLUTION) ×15 IMPLANT
PACK E OPEN HEART (SUTURE) ×3 IMPLANT
PACK OPEN HEART (CUSTOM PROCEDURE TRAY) ×3 IMPLANT
PAD ARMBOARD 7.5X6 YLW CONV (MISCELLANEOUS) ×6 IMPLANT
PAD ELECT DEFIB RADIOL ZOLL (MISCELLANEOUS) ×3 IMPLANT
PENCIL BUTTON HOLSTER BLD 10FT (ELECTRODE) ×3 IMPLANT
POWDER SURGICEL 3.0 GRAM (HEMOSTASIS) ×1 IMPLANT
PUNCH AORTIC ROTATE  4.5MM 8IN (MISCELLANEOUS) ×1 IMPLANT
PUNCH AORTIC ROTATE 4.0MM (MISCELLANEOUS) IMPLANT
PUNCH AORTIC ROTATE 4.5MM 8IN (MISCELLANEOUS) IMPLANT
PUNCH AORTIC ROTATE 5MM 8IN (MISCELLANEOUS) IMPLANT
SET CARDIOPLEGIA MPS 5001102 (MISCELLANEOUS) ×1 IMPLANT
SOLUTION ANTI FOG 6CC (MISCELLANEOUS) ×1 IMPLANT
SUT BONE WAX W31G (SUTURE) ×3 IMPLANT
SUT MNCRL AB 4-0 PS2 18 (SUTURE) ×1 IMPLANT
SUT PROLENE 3 0 SH DA (SUTURE) ×3 IMPLANT
SUT PROLENE 4 0 RB 1 (SUTURE) ×6
SUT PROLENE 4 0 SH DA (SUTURE) ×1 IMPLANT
SUT PROLENE 4-0 RB1 .5 CRCL 36 (SUTURE) IMPLANT
SUT PROLENE 5 0 C 1 36 (SUTURE) ×1 IMPLANT
SUT PROLENE 6 0 C 1 30 (SUTURE) ×6 IMPLANT
SUT PROLENE 7 0 BV1 MDA (SUTURE) ×4 IMPLANT
SUT PROLENE 8 0 BV175 6 (SUTURE) IMPLANT
SUT STEEL 6MS V (SUTURE) ×3 IMPLANT
SUT STEEL SZ 6 DBL 3X14 BALL (SUTURE) ×3 IMPLANT
SUT VIC AB 1 CTX 36 (SUTURE) ×6
SUT VIC AB 1 CTX36XBRD ANBCTR (SUTURE) ×4 IMPLANT
SUT VIC AB 2-0 CT1 27 (SUTURE) ×3
SUT VIC AB 2-0 CT1 TAPERPNT 27 (SUTURE) IMPLANT
SUT VIC AB 2-0 CTX 27 (SUTURE) ×2 IMPLANT
SUT VIC AB 3-0 SH 27 (SUTURE)
SUT VIC AB 3-0 SH 27X BRD (SUTURE) IMPLANT
SUT VIC AB 3-0 X1 27 (SUTURE) ×2 IMPLANT
SUT VICRYL 4-0 PS2 18IN ABS (SUTURE) IMPLANT
SYSTEM SAHARA CHEST DRAIN ATS (WOUND CARE) ×3 IMPLANT
TOWEL GREEN STERILE (TOWEL DISPOSABLE) ×3 IMPLANT
TOWEL GREEN STERILE FF (TOWEL DISPOSABLE) ×3 IMPLANT
TRAY FOLEY SLVR 16FR TEMP STAT (SET/KITS/TRAYS/PACK) ×3 IMPLANT
TUBE FEEDING 8FR 16IN STR KANG (MISCELLANEOUS) ×3 IMPLANT
TUBING INSUFFLATION (TUBING) ×3 IMPLANT
UNDERPAD 30X30 (UNDERPADS AND DIAPERS) ×3 IMPLANT
WATER STERILE IRR 1000ML POUR (IV SOLUTION) ×6 IMPLANT

## 2017-12-07 NOTE — Procedures (Signed)
Extubation Procedure Note  Patient Details:   Name: Ronald MCPHEETERS Sr. DOB: [birth year] MRN: [id number]   Airway Documentation:    Vent end date: (S) 12/07/17, Vent end time: (S) 2041   Evaluation O2 sats: stable throughout Complications: No apparent complications Patient did tolerate procedure well. Bilateral Breath Sounds: Clear   Able to speak post extubation: Yes  Patient passed all weaning parameters w/o complications. Patient has an audible cuff leak. Extubation procedure clearly explained to the patient. Pt nodded his head for understanding. Pt was extubated to a 3L [place] with bubble humidity provided. Pt was able to state his name, surgical procedure that was done, and whereabouts post extubation. No complications noted. RN at bedside throughout.  Leigh Aurora, B.S, RRT, RCP 12/07/2017, 8:58 PM

## 2017-12-07 NOTE — Progress Notes (Signed)
Patient transported to OR via stretcher. VSS. Belongings sent with family.

## 2017-12-07 NOTE — Progress Notes (Signed)
Patient able to follow commands. Attempted wean at this time. RR decreased and patient well back asleep. Will try again.

## 2017-12-07 NOTE — Anesthesia Procedure Notes (Signed)
Central Venous Catheter Insertion Performed by: Catalina Gravel, MD, anesthesiologist Start/End6/26/2019 6:40 AM, 12/07/2017 6:45 AM Patient location: Pre-op. Preanesthetic checklist: patient identified, IV checked, site marked, risks and benefits discussed, surgical consent, monitors and equipment checked, pre-op evaluation, timeout performed and anesthesia consent Hand hygiene performed  and maximum sterile barriers used  Total catheter length 100. PA cath was placed.Swan type:thermodilution PA Cath depth:45 Procedure performed without using ultrasound guided technique. Attempts: 1 Patient tolerated the procedure well with no immediate complications.

## 2017-12-07 NOTE — Interval H&P Note (Signed)
History and Physical Interval Note:  12/07/2017 7:40 AM  Ronald Fear Sr.  has presented today for surgery, with the diagnosis of CAD  The various methods of treatment have been discussed with the patient and family. After consideration of risks, benefits and other options for treatment, the patient has consented to  Procedure(s): CORONARY ARTERY BYPASS GRAFTING (CABG) (N/A) TRANSESOPHAGEAL ECHOCARDIOGRAM (TEE) (N/A) as a surgical intervention .  The patient's history has been reviewed, patient examined, no change in status, stable for surgery.  I have reviewed the patient's chart and labs.  Questions were answered to the patient's satisfaction.     Melrose Nakayama

## 2017-12-07 NOTE — Progress Notes (Signed)
Spiritual care at bedside per patient request.

## 2017-12-07 NOTE — Anesthesia Postprocedure Evaluation (Signed)
Anesthesia Post Note  Patient: Ronald DENNER Sr.  Procedure(s) Performed: CORONARY ARTERY BYPASS GRAFTING (CABG) times three using left internal mammary artery and left endoscopically harvested saphenous vein graft (N/A Chest) TRANSESOPHAGEAL ECHOCARDIOGRAM (TEE) (N/A )     Patient location during evaluation: ICU Anesthesia Type: General Level of consciousness: sedated Pain management: pain level controlled Vital Signs Assessment: post-procedure vital signs reviewed and stable Respiratory status: patient remains intubated per anesthesia plan Cardiovascular status: stable Postop Assessment: no apparent nausea or vomiting Anesthetic complications: no Comments: Remains supported by vasopressors postoperatively.    Last Vitals:  Vitals:   12/07/17 1530 12/07/17 1600  BP: 124/70 125/71  Pulse: 80 80  Resp: 12 12  Temp: (!) 35.9 C (!) 36 C  SpO2: 100% 100%    Last Pain:  Vitals:   12/07/17 1600  TempSrc: Core  PainSc:                  Audry Pili

## 2017-12-07 NOTE — Anesthesia Procedure Notes (Signed)
Procedure Name: Intubation Date/Time: 12/07/2017 7:52 AM Performed by: Neldon Newport, CRNA Pre-anesthesia Checklist: Timeout performed, Patient being monitored, Suction available, Emergency Drugs available and Patient identified Patient Re-evaluated:Patient Re-evaluated prior to induction Oxygen Delivery Method: Circle system utilized Preoxygenation: Pre-oxygenation with 100% oxygen Induction Type: IV induction Ventilation: Mask ventilation without difficulty Laryngoscope Size: Mac and 4 Grade View: Grade I Tube type: Oral Tube size: 8.0 mm Number of attempts: 1 Placement Confirmation: breath sounds checked- equal and bilateral,  positive ETCO2 and ETT inserted through vocal cords under direct vision Secured at: 23 cm Tube secured with: Tape Dental Injury: Teeth and Oropharynx as per pre-operative assessment

## 2017-12-07 NOTE — Brief Op Note (Signed)
12/01/2017 - 12/07/2017  1:27 PM  PATIENT:  Ronald Fear Sr.  83 y.o. male  PRE-OPERATIVE DIAGNOSIS:  2 VESSEL CAD  POST-OPERATIVE DIAGNOSIS:  2 VESSEL CAD  PROCEDURE:  Procedure(s):  CORONARY ARTERY BYPASS GRAFTING x 3 -LIMA to LAD -SVG to DIAGONAL -SVG to OM #2  ENDOSCOPIC HARVEST GREATER SAPHENOUS VEIN -Right Thigh to Below the Knee  TRANSESOPHAGEAL ECHOCARDIOGRAM (TEE) (N/A)  SURGEON:  Surgeon(s) and Role:    * Melrose Nakayama, MD - Primary  PHYSICIAN ASSISTANT: Ellwood Handler PA-C  ANESTHESIA:   general  EBL:  1300 mL   BLOOD ADMINISTERED: CELLSAVER  DRAINS: Left Pleural Chest Tube, Mediastinal Chest Drains   LOCAL MEDICATIONS USED:  NONE  SPECIMEN:  No Specimen  DISPOSITION OF SPECIMEN:  N/A  COUNTS:  YES  TOURNIQUET:  * No tourniquets in log *  DICTATION: .Dragon Dictation  PLAN OF CARE: Admit to inpatient   PATIENT DISPOSITION:  ICU - intubated and hemodynamically stable.   Delay start of Pharmacological VTE agent (>24hrs) due to surgical blood loss or risk of bleeding: yes XC= 71 min CPB= 101 min

## 2017-12-07 NOTE — Care Management Note (Signed)
Case Management Note  Patient Details  Name: Ronald Russell. MRN: [id number] Date of Birth: 1934/07/10  Subjective/Objective:  Pt is s/p CABG                  Action/Plan: PTA independent from home with wife.  Wife will provide 24 hour supervision.  Pt has PCP.     Expected Discharge Date:                  Expected Discharge Plan:  Home/Self Care  In-House Referral:     Discharge planning Services  CM Consult  Post Acute Care Choice:    Choice offered to:     DME Arranged:    DME Agency:     HH Arranged:    HH Agency:     Status of Service:     If discussed at [employer] of Stay Meetings, dates discussed:    Additional Comments:  Maryclare Labrador, RN 12/07/2017, 2:58 PM

## 2017-12-07 NOTE — Anesthesia Procedure Notes (Signed)
Central Venous Catheter Insertion Performed by: Catalina Gravel, MD, anesthesiologist Start/End6/26/2019 6:30 AM, 12/07/2017 6:40 AM Patient location: Pre-op. Preanesthetic checklist: patient identified, IV checked, site marked, risks and benefits discussed, surgical consent, monitors and equipment checked, pre-op evaluation, timeout performed and anesthesia consent Lidocaine 1% used for infiltration and patient sedated Hand hygiene performed  and maximum sterile barriers used  Catheter size: 8.5 Fr Sheath introducer Procedure performed using ultrasound guided technique. Ultrasound Notes:anatomy identified, needle tip was noted to be adjacent to the nerve/plexus identified, no ultrasound evidence of intravascular and/or intraneural injection and image(s) printed for medical record Attempts: 1 Following insertion, line sutured, dressing applied and Biopatch. Post procedure assessment: blood return through all ports, free fluid flow and no air  Patient tolerated the procedure well with no immediate complications.

## 2017-12-07 NOTE — Anesthesia Procedure Notes (Signed)
Arterial Line Insertion Start/End6/26/2019 7:40 AM, 12/07/2017 7:45 AM Performed by: Neldon Newport, CRNA, CRNA  Preanesthetic checklist: patient identified, IV checked, site marked, risks and benefits discussed and monitors and equipment checked Lidocaine 1% used for infiltration and patient sedated Left, radial was placed Catheter size: 20 G Hand hygiene performed  and maximum sterile barriers used   Attempts: 1 Procedure performed without using ultrasound guided technique. Following insertion, dressing applied and Biopatch. Post procedure assessment: unchanged and normal  Patient tolerated the procedure well with no immediate complications.

## 2017-12-07 NOTE — Progress Notes (Signed)
Surgery p.m. Rounds  Patient doing well after CABG 2 In process of being extubated, responsive on ventilator Stable hemodynamics

## 2017-12-07 NOTE — Op Note (Signed)
NAME: Ronald Russell, Belt MEDICAL RECORD OZ:[id number] ACCOUNT [id number] DATE OF BIRTH:04/19/1935 FACILITY: [facility] LOCATION: [facility] PHYSICIAN:Renaldo Gornick Chaya Jan, MD  OPERATIVE REPORT  DATE OF PROCEDURE:  12/07/2017  PREOPERATIVE DIAGNOSIS:  Severe 2-vessel coronary disease with unstable angina.  POSTOPERATIVE DIAGNOSIS:  Severe 2-vessel coronary disease with unstable angina.  PROCEDURE:  Median sternotomy, extracorporeal circulation, Coronary artery bypass grafting x 3  Left internal mammary artery to left anterior descending,  Saphenous vein graft to first diagonal,  saphenous vein graft to obtuse marginal 2. Endoscopic vein  harvest right thigh.  SURGEON:  Modesto Charon, MD  ASSISTANT:  Ellwood Handler, PA-C   ANESTHESIA:  General.  FINDINGS:  Transesophageal echocardiography showed preserved left ventricular function with no significant valvular pathology.  Left mammary and saphenous vein good quality.  Targets fair quality.  CLINICAL NOTE:  The patient is an 83 year old man with multiple cardiac risk factors who presented with chest tightness and shortness of breath.  This has been going on for a couple of months.  A stress test was positive, and he was scheduled for cardiac  catheterization.  However, before that could be done, he had sudden onset of severe chest tightness while driving.  That lasted about 10 minutes.  He then underwent catheterization which revealed severe 2-vessel coronary disease.  He was referred for  coronary artery bypass grafting.  The indications, risks, benefits, and alternatives were discussed in detail with the patient.  He understood and accepted the risks and agreed to proceed.  OPERATIVE NOTE:  The patient was brought to the preoperative holding area on 12/07/2017.  Anesthesia placed a Swan-Ganz catheter and an arterial blood pressure monitoring line.  He was taken to the operating room, anesthetized and intubated.  Intravenous  antibiotics  were administered.  Transesophageal echocardiography was performed by Dr. Fransisco Beau.  Findings as previously noted.  The chest, abdomen and legs were prepped and draped in the usual sterile fashion.  A median sternotomy was performed, and the left internal mammary artery was harvested using standard technique.  Simultaneously, an incision was made in the medial aspect of the right leg at the level of the knee.  The greater saphenous vein was  harvested from the right thigh endoscopically.  Both the mammary and saphenous vein were good quality vessels.  Five thousand units of heparin was administered during the vessel harvest.  The remainder of full heparin dose was given prior to opening the  pericardium.  After harvesting the conduits, the remainder of the heparin was given.  The pericardium was opened.  The ascending aorta was inspected.  There was no evidence of atherosclerotic disease.  After confirming adequate anticoagulation with ACT measurement,  the aorta was cannulated via concentric 2-0 Ethibond pledgeted pursestring sutures.  A dual-stage venous cannula was placed via a pursestring suture in the right atrial appendage.  Cardiopulmonary bypass was initiated.  Flows were maintained per protocol.   The patient was cooled to 32 degrees Celsius.  The coronary arteries were inspected, and anastomotic sites were chosen.  Of note, the obtuse marginal 1 was far too small to graft.  The conduits were inspected and cut to length.  A foam pad was placed in  the pericardium to insulate the heart.  A temperature probe was placed in the myocardial septum.  A cardioplegia cannula was placed in the ascending aorta.  A retrograde cardioplegia cannula was placed via a pursestring suture in the right atrium and  directed into the coronary sinus.  The aorta was  cross clamped.  The left ventricle was emptied via the aortic root vent.  Cardiac arrest then was achieved with a combination of cold antegrade and retrograde  blood cardioplegia and topical iced saline.  Antegrade cardioplegia was  administered initially. There was a rapid diastolic arrest but relatively slow septal cooling.  With retrograde cardioplegia, there was septal cooling to 9 degrees Celsius.  A reversed saphenous vein graft was placed end to side to the first diagonal branch to the LAD.  This was a 1.5 mm fair quality target vessel.  The probe did pass easily distally.  The vein was of good quality.  An end-to-side anastomosis was performed  with a running 7-0 Prolene suture.  All anastomoses were probed proximally and distally at their completion to ensure patency before tying the suture.  Cardioplegia was administered down each vein graft at its completion to assess for flow and  hemostasis.  Both were good.  Next, a reversed saphenous vein graft was placed end-to-side to the second obtuse marginal.  This was the only one of the obtuse marginal branches that was large enough to graft.  It was a 1.5 mm fair quality target.  The vein was of good quality.  The  end-to-side anastomosis was performed with a running 7-0 Prolene suture.  A probe passed easily proximally and distally.  Cardioplegia was administered.  There was again good flow and good hemostasis.  Additional cardioplegia was administered retrograde.  Next, the left internal mammary artery was brought through a window in the pericardium.  The distal end was bevelled.  It was anastomosed end-to-side to the distal LAD.  The LAD had diffuse plaque  throughout, but it did accept a 1.5 mm probe proximally and distally.  The mammary was anastomosed end-to-side with a running 8-0 Prolene suture.  At the completion of the anastomosis, the bulldog clamp was briefly removed.  There was good hemostasis.  Rapid septal rewarming was noted. The bulldog clamp was replaced.    Additional cardioplegia was administered retrograde.  The veins were cut to length.  The proximal vein graft anastomoses were  performed to a 4.5 mm punch aortotomies with running 6-0 Prolene  sutures.  After completion of the final proximal anastomosis, the patient was placed in Trendelenburg position.  Lidocaine was administered.  The aortic root was deaired, and the bulldog clamp was again removed from the left mammary artery.  The aortic  crossclamp was removed.  The total crossclamp time was 71 minutes.  The patient required a single defibrillation with 10 joules and then was in sinus rhythm thereafter, although with a long PR interval.  While rewarming was completed, all proximal and distal anastomoses were inspected for hemostasis.  Epicardial pacing wires were placed on the right ventricle and right atrium. When the patient had rewarmed to a core temperature of 37 degrees Celsius.  He  was weaned from cardiopulmonary bypass on the first attempt.  The initial cardiac index was less than 2 L/min per sq m, but with volume administration, the cardiac index improved.  Post-bypass transesophageal echocardiography showed good left ventricular  function.  A test dose of protamine was administered and was well tolerated.  The atrial and aortic cannulae were removed.  The remainder of protamine was administered without incident.  The chest was irrigated with warm saline.  Hemostasis was achieved.  Left  pleural and mediastinal chest tubes were placed through separate subcostal incisions.  The pericardium was reapproximated with interrupted 3-0 silk suture.  It came  together easily without tension.  The patient did have again a low cardiac index while  volume resuscitation was underway.  A low-dose dopamine infusion was initiated at 3 mcg/kg per minute.  The sternum was closed with a combination of single and double heavy stainless-steel wires.  The pectoralis fascia, subcutaneous tissue and skin were  closed in standard fashion.  All sponge, needle and instrument counts were correct at the end of the procedure.  The patient was  taken from the operating room to the surgical intensive care unit, intubated and in good condition.  LN/NUANCE  D:12/07/2017 T:12/07/2017 JOB:[id number]

## 2017-12-07 NOTE — Progress Notes (Signed)
Pt passed all weaning parameters. Pt is alert/oriented and hemodynamically stable no distress or complications noted.   Results:   NIF: -30 FVC: 1.5L Patient has an audible cuff leak.

## 2017-12-07 NOTE — Progress Notes (Signed)
Patient went to the operating room today for CABG.  Currently intubated mechanically ventilated on sedation and pressors.  Patient will be managed by CT surgery and PCCM.

## 2017-12-07 NOTE — Transfer of Care (Signed)
Immediate Anesthesia Transfer of Care Note  Patient: Ronald FRERKING Sr.  Procedure(s) Performed: CORONARY ARTERY BYPASS GRAFTING (CABG) times three using left internal mammary artery and left endoscopically harvested saphenous vein graft (N/A Chest) TRANSESOPHAGEAL ECHOCARDIOGRAM (TEE) (N/A )  Patient Location: SICU  Anesthesia Type:General  Level of Consciousness: Patient remains intubated per anesthesia plan  Airway & Oxygen Therapy: Patient remains intubated per anesthesia plan and Patient placed on Ventilator (see vital sign flow sheet for setting)  Post-op Assessment: Report given to RN and Post -op Vital signs reviewed and stable  Post vital signs: Reviewed and stable  Last Vitals:  Vitals Value Taken Time  BP    Temp    Pulse    Resp    SpO2      Last Pain:  Vitals:   12/07/17 0358  TempSrc: Oral  PainSc:       Patients Stated Pain Goal: 0 (45/99/77 4142)  Complications: No apparent anesthesia complications

## 2017-12-07 NOTE — Progress Notes (Signed)
  Echocardiogram Echocardiogram Transesophageal has been performed.  Merrie Roof F 12/07/2017, 10:22 AM

## 2017-12-07 NOTE — Progress Notes (Signed)
PHARMACY NOTE:  ANTIMICROBIAL RENAL DOSAGE ADJUSTMENT  Current antimicrobial regimen includes a mismatch between antimicrobial dosage and estimated renal function.  As per policy approved by the Pharmacy & Therapeutics and Medical Executive Committees, the antimicrobial dosage will be adjusted accordingly.  Current antimicrobial dosage:  Acyclovir 10mg /kg IV q12h  Indication: possible shingles  Renal Function:  Estimated Creatinine Clearance: 65.5 mL/min (by C-G formula based on SCr of 1 mg/dL). []      On intermittent HD, scheduled: []      On CRRT    Antimicrobial dosage has been changed to:  Acyclovir 800mg  IV q8h  Additional comments: Dose is closer to 10mg /kg for AdjBW since patient is not obese   Thank you for allowing pharmacy to be a part of this patient's care.  Hildred Laser, PharmD Clinical Pharmacist Clinical phone from 8:30-4:00 is [phone] After 4pm, please call Main Rx [phone]) for assistance. 12/07/2017 2:36 PM

## 2017-12-08 ENCOUNTER — Inpatient Hospital Stay (HOSPITAL_COMMUNITY): Payer: PPO

## 2017-12-08 ENCOUNTER — Encounter (HOSPITAL_COMMUNITY): Payer: Self-pay | Admitting: Thoracic Surgery (Cardiothoracic Vascular Surgery)

## 2017-12-08 LAB — BASIC METABOLIC PANEL
Anion gap: 6 (ref 5–15)
BUN: 13 mg/dL (ref 8–23)
CO2: 23 mmol/L (ref 22–32)
Calcium: 7.5 mg/dL — ABNORMAL LOW (ref 8.9–10.3)
Chloride: 108 mmol/L (ref 98–111)
Creatinine, Ser: 1.35 mg/dL — ABNORMAL HIGH (ref 0.61–1.24)
GFR calc Af Amer: 55 mL/min — ABNORMAL LOW (ref >60)
GFR calc non Af Amer: 48 mL/min — ABNORMAL LOW (ref >60)
Glucose, Bld: 122 mg/dL — ABNORMAL HIGH (ref 70–99)
Potassium: 4.1 mmol/L (ref 3.5–5.1)
Sodium: 137 mmol/L (ref 135–145)

## 2017-12-08 LAB — POCT I-STAT, CHEM 8
BUN: 18 mg/dL (ref 8–23)
Calcium, Ion: 1.13 mmol/L — ABNORMAL LOW (ref 1.15–1.40)
Chloride: 101 mmol/L (ref 98–111)
Creatinine, Ser: 1.6 mg/dL — ABNORMAL HIGH (ref 0.61–1.24)
Glucose, Bld: 132 mg/dL — ABNORMAL HIGH (ref 70–99)
HCT: 22 % — ABNORMAL LOW (ref 39.0–52.0)
Hemoglobin: 7.5 g/dL — ABNORMAL LOW (ref 13.0–17.0)
Potassium: 3.9 mmol/L (ref 3.5–5.1)
Sodium: 138 mmol/L (ref 135–145)
TCO2: 21 mmol/L — ABNORMAL LOW (ref 22–32)

## 2017-12-08 LAB — CBC
HCT: 27 % — ABNORMAL LOW (ref 39.0–52.0)
HCT: 25.4 % — ABNORMAL LOW (ref 39.0–52.0)
Hemoglobin: 9 g/dL — ABNORMAL LOW (ref 13.0–17.0)
Hemoglobin: 8.2 g/dL — ABNORMAL LOW (ref 13.0–17.0)
MCH: 30.6 pg (ref 26.0–34.0)
MCH: 30.4 pg (ref 26.0–34.0)
MCHC: 33.3 g/dL (ref 30.0–36.0)
MCHC: 32.3 g/dL (ref 30.0–36.0)
MCV: 91.8 fL (ref 78.0–100.0)
MCV: 94.1 fL (ref 78.0–100.0)
Platelets: 113 10*3/uL — ABNORMAL LOW (ref 150–400)
Platelets: 74 10*3/uL — ABNORMAL LOW (ref 150–400)
RBC: 2.94 MIL/uL — ABNORMAL LOW (ref 4.22–5.81)
RBC: 2.7 MIL/uL — ABNORMAL LOW (ref 4.22–5.81)
RDW: 14.3 % (ref 11.5–15.5)
RDW: 14.7 % (ref 11.5–15.5)
WBC: 15.2 10*3/uL — ABNORMAL HIGH (ref 4.0–10.5)
WBC: 12.3 10*3/uL — ABNORMAL HIGH (ref 4.0–10.5)

## 2017-12-08 LAB — GLUCOSE, CAPILLARY
Glucose-Capillary: 116 mg/dL — ABNORMAL HIGH (ref 70–99)
Glucose-Capillary: 122 mg/dL — ABNORMAL HIGH (ref 70–99)
Glucose-Capillary: 123 mg/dL — ABNORMAL HIGH (ref 70–99)
Glucose-Capillary: 124 mg/dL — ABNORMAL HIGH (ref 70–99)
Glucose-Capillary: 128 mg/dL — ABNORMAL HIGH (ref 70–99)
Glucose-Capillary: 131 mg/dL — ABNORMAL HIGH (ref 70–99)
Glucose-Capillary: 135 mg/dL — ABNORMAL HIGH (ref 70–99)
Glucose-Capillary: 136 mg/dL — ABNORMAL HIGH (ref 70–99)
Glucose-Capillary: 138 mg/dL — ABNORMAL HIGH (ref 70–99)
Glucose-Capillary: 146 mg/dL — ABNORMAL HIGH (ref 70–99)
Glucose-Capillary: 149 mg/dL — ABNORMAL HIGH (ref 70–99)
Glucose-Capillary: 161 mg/dL — ABNORMAL HIGH (ref 70–99)
Glucose-Capillary: 95 mg/dL (ref 70–99)
Glucose-Capillary: 95 mg/dL (ref 70–99)

## 2017-12-08 LAB — MAGNESIUM
Magnesium: 2.6 mg/dL — ABNORMAL HIGH (ref 1.7–2.4)
Magnesium: 2.5 mg/dL — ABNORMAL HIGH (ref 1.7–2.4)

## 2017-12-08 LAB — POCT I-STAT 4, (NA,K, GLUC, HGB,HCT)
Glucose, Bld: 169 mg/dL — ABNORMAL HIGH (ref 70–99)
HCT: 30 % — ABNORMAL LOW (ref 39.0–52.0)
Hemoglobin: 10.2 g/dL — ABNORMAL LOW (ref 13.0–17.0)
Potassium: 4 mmol/L (ref 3.5–5.1)
Sodium: 139 mmol/L (ref 135–145)

## 2017-12-08 LAB — CREATININE, SERUM
Creatinine, Ser: 1.79 mg/dL — ABNORMAL HIGH (ref 0.61–1.24)
GFR calc Af Amer: 39 mL/min — ABNORMAL LOW (ref >60)
GFR calc non Af Amer: 34 mL/min — ABNORMAL LOW (ref >60)

## 2017-12-08 MED ORDER — INSULIN ASPART 100 UNIT/ML ~~LOC~~ SOLN
0.0000 [IU] | SUBCUTANEOUS | Status: DC
  Administered 2017-12-08 – 2017-12-09 (×2): 2 [IU] via SUBCUTANEOUS

## 2017-12-08 MED ORDER — INSULIN DETEMIR 100 UNIT/ML ~~LOC~~ SOLN
15.0000 [IU] | Freq: Two times a day (BID) | SUBCUTANEOUS | Status: DC
  Administered 2017-12-08 (×2): 15 [IU] via SUBCUTANEOUS
  Filled 2017-12-08 (×3): qty 0.15

## 2017-12-08 MED ORDER — CHOLESTYRAMINE 4 G PO PACK: 4.0000 g | PACK | Freq: Every day | ORAL | Status: DC

## 2017-12-08 MED ORDER — DEXTROSE 5 % IV SOLN
800.0000 mg | Freq: Three times a day (TID) | INTRAVENOUS | Status: DC
  Administered 2017-12-08 – 2017-12-09 (×2): 800 mg via INTRAVENOUS
  Filled 2017-12-08 (×3): qty 16

## 2017-12-08 MED ORDER — ENOXAPARIN SODIUM 40 MG/0.4ML ~~LOC~~ SOLN
40.0000 mg | Freq: Every day | SUBCUTANEOUS | Status: DC
  Administered 2017-12-08: 40 mg via SUBCUTANEOUS
  Filled 2017-12-08: qty 0.4

## 2017-12-08 MED ORDER — OXYCODONE HCL 5 MG PO TABS: 5.0000 mg | ORAL_TABLET | ORAL | Status: DC | PRN

## 2017-12-08 MED ORDER — TRAMADOL HCL 50 MG PO TABS
50.0000 mg | ORAL_TABLET | Freq: Four times a day (QID) | ORAL | Status: DC | PRN
Start: 2017-12-08 — End: 2017-12-14
  Filled 2017-12-08: qty 1

## 2017-12-08 MED FILL — Mannitol IV Soln 20%: INTRAVENOUS | Qty: 500 | Status: AC

## 2017-12-08 MED FILL — Magnesium Sulfate Inj 50%: INTRAMUSCULAR | Qty: 10 | Status: AC

## 2017-12-08 MED FILL — Potassium Chloride Inj 2 mEq/ML: INTRAVENOUS | Qty: 20 | Status: AC

## 2017-12-08 MED FILL — Sodium Bicarbonate IV Soln 8.4%: INTRAVENOUS | Qty: 50 | Status: AC

## 2017-12-08 MED FILL — Heparin Sodium (Porcine) Inj 1000 Unit/ML: INTRAMUSCULAR | Qty: 20 | Status: AC

## 2017-12-08 MED FILL — Lidocaine HCl Local Soln Prefilled Syringe 100 MG/5ML (2%): INTRAMUSCULAR | Qty: 5 | Status: AC

## 2017-12-08 MED FILL — Albumin, Human Inj 5%: INTRAVENOUS | Qty: 500 | Status: AC

## 2017-12-08 MED FILL — Sodium Chloride IV Soln 0.9%: INTRAVENOUS | Qty: 2000 | Status: AC

## 2017-12-08 MED FILL — Electrolyte-R (PH 7.4) Solution: INTRAVENOUS | Qty: 4000 | Status: AC

## 2017-12-08 MED FILL — Heparin Sodium (Porcine) Inj 1000 Unit/ML: INTRAMUSCULAR | Qty: 30 | Status: AC

## 2017-12-08 NOTE — Consult Note (Signed)
            [facility] CM Primary Care Navigator  12/08/2017  Ronald PAT Sr. Feb 04, 1935 [id number]   Attempt to see patient at the bedside to identify possible discharge needs butstaffreports that patient was transferred from2C04 to 2H22(cardiac ICU) after surgery.  Will attempt to see patient at another time when out of ICU.    For additional questions please contact:  [name], BSN, RN-BC [facility] PRIMARY CARE Navigator Cell: [phone]

## 2017-12-08 NOTE — Progress Notes (Signed)
1 Day Post-Op Procedure(s) (LRB): CORONARY ARTERY BYPASS GRAFTING (CABG) times three using left internal mammary artery and left endoscopically harvested saphenous vein graft (N/A) TRANSESOPHAGEAL ECHOCARDIOGRAM (TEE) (N/A) Subjective: C/o neck and back pain, "can't get comfortable"  Objective: Vital signs in last 24 hours: Temp:  [96.3 F (35.7 C)-99.5 F (37.5 C)] 99 F (37.2 C) (06/27 0800) Pulse Rate:  [80-89] 88 (06/27 0800) Cardiac Rhythm: Ventricular paced (06/27 0700) Resp:  [12-23] 16 (06/27 0800) BP: (76-153)/(50-84) 106/59 (06/27 0800) SpO2:  [95 %-100 %] 98 % (06/27 0701) Arterial Line BP: (87-272)/(9-75) 150/49 (06/27 0730) FiO2 (%):  [40 %-50 %] 40 % (06/26 2000) Weight:  [205 lb 11 oz (93.3 kg)] 205 lb 11 oz (93.3 kg) (06/27 0500)  Hemodynamic parameters for last 24 hours: PAP: (18-32)/(6-18) 24/15 CO:  [3.4 L/min-4.7 L/min] 4.3 L/min CI:  [1.6 L/min/m2-2.2 L/min/m2] 2 L/min/m2  Intake/Output from previous day: 06/26 0701 - 06/27 0700 In: 7323.7 [I.V.:3751.9; Blood:480; IV Piggyback:3091.8] Out: 5555 [Urine:3595; Blood:1300; Chest Tube:660] Intake/Output this shift: Total I/O In: 217.2 [I.V.:109.9; IV Piggyback:107.3] Out: 35 [Urine:30; Chest Tube:5]  General appearance: alert, cooperative and no distress Neurologic: intact Heart: regular rate and rhythm Lungs: diminished breath sounds bibasilar Abdomen: mildly distended, nontender, + BS  Lab Results: Recent Labs    12/07/17 1927 12/08/17 0407  WBC 15.7* 15.2*  HGB 10.3* 9.0*  HCT 30.8* 27.0*  PLT 109* 113*   BMET:  Recent Labs    12/07/17 0436  12/07/17 1925 12/07/17 1927 12/08/17 0407  NA 140   < > 139  --  137  K 3.9   < > 3.8  --  4.1  CL 106   < > 102  --  108  CO2 27  --   --   --  23  GLUCOSE 86   < > 149*  --  122*  BUN 15   < > 11  --  13  CREATININE 1.52*   < > 1.10 1.24 1.35*  CALCIUM 9.1  --   --   --  7.5*   < > = values in this interval not displayed.    PT/INR:  Recent  Labs    12/07/17 1343  LABPROT 17.2*  INR 1.42   ABG    Component Value Date/Time   PHART 7.366 12/07/2017 2152   HCO3 18.6 (L) 12/07/2017 2152   TCO2 20 (L) 12/07/2017 2152   ACIDBASEDEF 6.0 (H) 12/07/2017 2152   O2SAT 98.0 12/07/2017 2152   CBG (last 3)  Recent Labs    12/08/17 0308 12/08/17 0429 12/08/17 0650  GLUCAP 116* 122* 124*    Assessment/Plan: S/P Procedure(s) (LRB): CORONARY ARTERY BYPASS GRAFTING (CABG) times three using left internal mammary artery and left endoscopically harvested saphenous vein graft (N/A) TRANSESOPHAGEAL ECHOCARDIOGRAM (TEE) (N/A) -  CV- good hemodynamics- DC Swan and A line  ASA, beta blocker, statin  Wean dopamine as tolerated  RESP- IS for atelectasis  RENAL- creatinine up slightly - follow  ENDO- CBG well controlled  GI- advance diet as tolerated  SCD + Enoxaparin for DVT prophylaxis  DC Chest tubes  Mobilize  LOS: 6 days    Ronald Russell 12/08/2017

## 2017-12-08 NOTE — Progress Notes (Signed)
      [address]       [place] [address]             [phone]      Up in chair  Feels better this afternoon  BP (!) 121/52   Pulse 90   Temp 97.8 F (36.6 C) (Oral)   Resp 17   Ht 6\' 1"  (1.854 m)   Wt 205 lb 11 oz (93.3 kg)   SpO2 95%   BMI 27.14 kg/m   Intake/Output Summary (Last 24 hours) at 12/08/2017 1816 Last data filed at 12/08/2017 1800 Gross per 24 hour  Intake 2958.22 ml  Output 1155 ml  Net 1803.22 ml   Creatinine up to 1.6 Hct= 22  Overall doing well POD # 1, follow creatinine  Remo Lipps C. Roxan Hockey, MD [facility] [phone]

## 2017-12-08 NOTE — Discharge Summary (Signed)
Physician Discharge Summary  Patient ID: Ronald Russell. MRN: [id number] DOB/AGE: Oct 22, 1934 83 y.o.  Admit date: 12/01/2017 Discharge date: 12/14/2017  Admission Diagnoses:  Patient Active Problem List   Diagnosis Date Noted  . Coronary artery disease involving native coronary artery of native heart with unstable angina pectoris ([place])   . Unstable angina ([place]) 12/01/2017  . Sebaceous cyst 09/20/2017  . Postoperative right upper quadrant abdominal pain 12/10/2016  . S/P laparoscopic cholecystectomy 12/08/2016  . Hyperglycemia 11/29/2016  . Thrombocytopenia ([place]) 11/29/2016  . Palpitations 09/01/2016  . Renal insufficiency 09/01/2016  . HLD (hyperlipidemia) 09/01/2016  . Hypokalemia 09/01/2016  . Localized swelling of lower extremity 09/01/2016  . Gout 05/20/2016  . Post herpetic neuralgia 05/20/2016  . GAD (generalized anxiety disorder) 01/27/2016  . Vitamin D deficiency 11/06/2015  . BPPV (benign paroxysmal positional vertigo) 09/30/2015  . Immune deficiency disorder ([place]) 08/19/2015  . Temporal arteritis ([place]) 04/18/2013  . GERD (gastroesophageal reflux disease)   . Hypertension   . HOH (hard of hearing)   . Symptomatic PVCs 07/31/2011   Discharge Diagnoses:   Patient Active Problem List   Diagnosis Date Noted  . S/P CABG x 3 12/07/2017  . Coronary artery disease involving native coronary artery of native heart with unstable angina pectoris ([place])   . Unstable angina ([place]) 12/01/2017  . Sebaceous cyst 09/20/2017  . Postoperative right upper quadrant abdominal pain 12/10/2016  . S/P laparoscopic cholecystectomy 12/08/2016  . Hyperglycemia 11/29/2016  . Thrombocytopenia ([place]) 11/29/2016  . Palpitations 09/01/2016  . Renal insufficiency 09/01/2016  . HLD (hyperlipidemia) 09/01/2016  . Hypokalemia 09/01/2016  . Localized swelling of lower extremity 09/01/2016  . Gout 05/20/2016  . Post herpetic neuralgia 05/20/2016  . GAD (generalized anxiety disorder) 01/27/2016   . Vitamin D deficiency 11/06/2015  . BPPV (benign paroxysmal positional vertigo) 09/30/2015  . Immune deficiency disorder ([place]) 08/19/2015  . Temporal arteritis ([place]) 04/18/2013  . GERD (gastroesophageal reflux disease)   . Hypertension   . HOH (hard of hearing)   . Symptomatic PVCs 07/31/2011   Discharged Condition: good  History of Present Illness:  Ronald Russell is an 83 yo male with known history, bladder cancer, temporal arteritis, anxiety and recurrent bronchitis.  He has been experiencing chest tightness and shortness of breath with exertion over the past couple of months.  He was evaluated by Dr. Irish Lack who obtained a stress test which was ultimately positive.  Due to this elective cardiac catheterization was performed.  However on 12/01/2017 while driving the patient suffered a sudden onset episode of chest tightness like something was "squeezing my heart."  This episode lasted 10 minutes before resolving.  There was no associated nausea, vomiting, or diaphoresis and the pain did not radiate any where.  Catheterization was performed on 12/02/2017 which showed severe 2 vessel CAD.  It was felt the patient would benefit from coronary bypass grafting he was admitted and cardiothoracic consult was requested.  Hospital Course:   He remained chest pain free during hospitalization.  He was evaluated by Dr. Roxan Hockey who was in agreement the patient would benefit from coronary bypass.  The risks and benefits of the procedure were explained to the patient and he was agreeable to proceed.  He was taken to the operating room on 12/07/2017.  He underwent CABG x 3 utilizing LIMA to LAD, SVG to OM, and SVG to Diagonal.  He also underwent endoscopic harvest of greater saphenous vein from his right leg.  He tolerated the procedure without difficulty  and was taken to the SICU in stable condition.  He was extubated the evening of surgery.  During his stay in the SICU the patient was weaned off Dopamine drip  as tolerated.  His chest tubes and arterial lines were removed without difficulty.  The patient does have an expected acute blood loss anemia.  He did require a blood transfusion and values have stabilized.  Most recent hemoglobin and hematocrit are 8.8/27.1 respectively.  Patient did have  an acute renal insufficiency but BUN and creatinine have shown a steady improvement over time.  Most recent values are 13/1.33 respectively.  We are carefully avoiding any nephrotoxic medications.  Incisions are noted to be healing well without evidence of infection.  He is tolerating diet.  Blood sugars have been under adequate control and most recent hemoglobin A1c is noted to be 5.3.  He has been given information on heart healthy dietary changes.  Currently the patient is felt to be quite stable for discharge.  Consults: cardiology  Significant Diagnostic Studies: angiography:    Prox Cx lesion is 80% stenosed.  Ost 2nd Diag to 2nd Diag lesion is 75% stenosed.  Mid LAD lesion is 100% stenosed.  Mid Cx to Dist Cx lesion is 70% stenosed.  Prox LAD lesion is 75% stenosed.  The left ventricular systolic function is normal.  The left ventricular ejection fraction is 55-65% by visual estimate.  LV end diastolic pressure is mildly elevated.   Severe calcific 2 vessel CAD including LAD CTO, severe diagonal disease and severe disease in the mid and distal circumflex.  Treatments: surgery:   Median sternotomy, extracorporeal circulation, coronary artery bypass grafting x3 (left internal mammary artery to left anterior descending, saphenous vein graft to first diagonal, saphenous vein graft to obtuse marginal 2), endoscopic vein harvest, right thigh.  Discharge Exam: Blood pressure (!) 145/87, pulse 96, temperature 98.3 F (36.8 C), temperature source Oral, resp. rate 18, height 6\' 1"  (1.854 m), weight 89.2 kg (196 lb 11.2 oz), SpO2 97 %.   General appearance: alert, cooperative and no distress Heart:  regular rate and rhythm Lungs: clear to auscultation bilaterally Abdomen: benign Extremities: no edema Wound: incis healing well   Disposition: Discharge disposition: 01-Home or Self Care     Home  Discharge Medications:  The patient has been discharged on:   1.Beta Blocker:  Yes [x   ]                              No   [   ]                              If No, reason:  2.Ace Inhibitor/ARB: Yes [   ]                                     No  [    ]                                     If No, reason:  3.Statin:   Yes [ x  ]                  No  [   ]  If No, reason:  4.Ecasa:  Yes  [  x ]                  No   [   ]                  If No, reason:     Discharge Instructions    Discharge patient   Complete by:  As directed    Discharge disposition:  01-Home or Self Care   Discharge patient date:  12/14/2017     Allergies as of 12/14/2017      Reactions   Uloric [febuxostat] Palpitations, Other (See Comments), Hypertension   Hypertension with palpitations and a sense of fuzziness and dizziness at the left side of his head   Augmentin [amoxicillin-pot Clavulanate] Diarrhea   Ciprofloxacin Diarrhea   Codeine Nausea And Vomiting, Other (See Comments)   Severe headache   Oxycodone Nausea And Vomiting   Tramadol Nausea And Vomiting   Vicodin [hydrocodone-acetaminophen] Nausea And Vomiting      Medication List    STOP taking these medications   isosorbide mononitrate 30 MG 24 hr tablet Commonly known as:  IMDUR   metoprolol 200 MG 24 hr tablet Commonly known as:  TOPROL-XL   pantoprazole 40 MG tablet Commonly known as:  PROTONIX     TAKE these medications   acetaminophen 500 MG tablet Commonly known as:  TYLENOL Take 1,000 mg by mouth every 6 (six) hours as needed for headache (pain). What changed:  Another medication with the same name was removed. Continue taking this medication, and follow the directions you see here.   amLODipine 5 MG  tablet Commonly known as:  NORVASC Take 1 tablet (5 mg total) by mouth daily.   aspirin EC 81 MG tablet Take 1 tablet (81 mg total) by mouth daily.   CALCIUM-MAGNESIUM-VITAMIN D PO Take 2-3 tablets by mouth at bedtime.   capsaicin 0.025 % cream Commonly known as:  ZOSTRIX Apply topically 2 (two) times daily.   cholestyramine 4 g packet Commonly known as:  QUESTRAN Take 1 packet (4 g total) by mouth 4 (four) times daily. What changed:  when to take this   dextromethorphan-guaiFENesin 30-600 MG 12hr tablet Commonly known as:  MUCINEX DM Take 2 tablets by mouth 2 (two) times daily.   fluocinonide cream 0.05 % Commonly known as:  LIDEX Apply 1 application topically 2 (two) times daily as needed (skin tab).   fluticasone 50 MCG/ACT nasal spray Commonly known as:  FLONASE Place 2 sprays into both nostrils at bedtime.   metoprolol tartrate 25 MG tablet Commonly known as:  LOPRESSOR Take 1 tablet (25 mg total) by mouth 2 (two) times daily.   omeprazole 40 MG capsule Commonly known as:  PRILOSEC Take 40 mg by mouth 2 (two) times daily.   oxyCODONE 5 MG immediate release tablet Commonly known as:  Oxy IR/ROXICODONE Take 1 tablet (5 mg total) by mouth every 6 (six) hours as needed for up to 7 days for severe pain.   Potassium Gluconate 550 MG Tabs Take 550 mg by mouth at bedtime.   pramoxine-hydrocortisone 1-1 % rectal cream Commonly known as:  PROCTOCREAM-HC Place 1 application rectally 2 (two) times daily.   pravastatin 40 MG tablet Commonly known as:  PRAVACHOL Take 1 tablet (40 mg total) by mouth daily.   PROBIOTIC PO Take 1 tablet by mouth daily.   REFRESH OP Place 1 drop into both eyes at bedtime as needed (  dry eyes).   SCALPICIN EX Apply 1 application topically daily as needed (scalp itching).      Follow-up Information    Melrose Nakayama, MD Follow up on 01/10/2018.   Specialty:  Cardiothoracic Surgery Why:  Appointment is at 12:45, please get CXR  at 12:15 at [facility] located on first floor of our office building Contact information: [address] [place] [address] [phone]          The patient has been discharged on:   1.Beta Blocker:  Yes [ y  ]                              No   [   ]                              If No, reason:  2.Ace Inhibitor/ARB: Yes [   ]                                     No  [  n  ]                                     If No, reason: Renal insufficiency 3.Statin:   Yes Blue.[place]   ]                  No  [   ]                  If No, reason:  4.Ecasa:  Yes  [ y  ]                  No   [   ]                  If No, reason:  Signed: John Giovanni 12/14/2017, 8:51 AM

## 2017-12-09 ENCOUNTER — Inpatient Hospital Stay (HOSPITAL_COMMUNITY): Payer: PPO

## 2017-12-09 LAB — BASIC METABOLIC PANEL
Anion gap: 7 (ref 5–15)
BUN: 19 mg/dL (ref 8–23)
CO2: 24 mmol/L (ref 22–32)
Calcium: 7.7 mg/dL — ABNORMAL LOW (ref 8.9–10.3)
Chloride: 106 mmol/L (ref 98–111)
Creatinine, Ser: 1.54 mg/dL — ABNORMAL HIGH (ref 0.61–1.24)
GFR calc Af Amer: 47 mL/min — ABNORMAL LOW (ref >60)
GFR calc non Af Amer: 41 mL/min — ABNORMAL LOW (ref >60)
Glucose, Bld: 118 mg/dL — ABNORMAL HIGH (ref 70–99)
Potassium: 4 mmol/L (ref 3.5–5.1)
Sodium: 137 mmol/L (ref 135–145)

## 2017-12-09 LAB — CBC
HCT: 23.4 % — ABNORMAL LOW (ref 39.0–52.0)
Hemoglobin: 7.6 g/dL — ABNORMAL LOW (ref 13.0–17.0)
MCH: 30.9 pg (ref 26.0–34.0)
MCHC: 32.5 g/dL (ref 30.0–36.0)
MCV: 95.1 fL (ref 78.0–100.0)
Platelets: 67 10*3/uL — ABNORMAL LOW (ref 150–400)
RBC: 2.46 MIL/uL — ABNORMAL LOW (ref 4.22–5.81)
RDW: 14.7 % (ref 11.5–15.5)
WBC: 11.5 10*3/uL — ABNORMAL HIGH (ref 4.0–10.5)

## 2017-12-09 LAB — GLUCOSE, CAPILLARY
Glucose-Capillary: 117 mg/dL — ABNORMAL HIGH (ref 70–99)
Glucose-Capillary: 108 mg/dL — ABNORMAL HIGH (ref 70–99)
Glucose-Capillary: 119 mg/dL — ABNORMAL HIGH (ref 70–99)
Glucose-Capillary: 103 mg/dL — ABNORMAL HIGH (ref 70–99)

## 2017-12-09 MED ORDER — SODIUM CHLORIDE 0.9% FLUSH: 3.0000 mL | INTRAVENOUS | Status: DC | PRN

## 2017-12-09 MED ORDER — MAGNESIUM HYDROXIDE 400 MG/5ML PO SUSP: 30.0000 mL | Freq: Every day | ORAL | Status: DC | PRN

## 2017-12-09 MED ORDER — ALPRAZOLAM 0.25 MG PO TABS
0.2500 mg | ORAL_TABLET | Freq: Two times a day (BID) | ORAL | Status: DC | PRN
  Administered 2017-12-10 – 2017-12-11 (×2): 0.25 mg via ORAL
  Filled 2017-12-09 (×2): qty 1

## 2017-12-09 MED ORDER — FLUTICASONE PROPIONATE 50 MCG/ACT NA SUSP
2.0000 | Freq: Every day | NASAL | Status: DC | PRN
  Administered 2017-12-09 – 2017-12-10 (×2): 2 via NASAL
  Filled 2017-12-09: qty 16

## 2017-12-09 MED ORDER — DM-GUAIFENESIN ER 30-600 MG PO TB12
2.0000 | ORAL_TABLET | Freq: Two times a day (BID) | ORAL | Status: DC | PRN
  Administered 2017-12-09 – 2017-12-13 (×6): 2 via ORAL
  Filled 2017-12-09 (×8): qty 2

## 2017-12-09 MED ORDER — SODIUM CHLORIDE 0.9% FLUSH
3.0000 mL | Freq: Two times a day (BID) | INTRAVENOUS | Status: DC
  Administered 2017-12-10 – 2017-12-14 (×9): 3 mL via INTRAVENOUS

## 2017-12-09 MED ORDER — INSULIN ASPART 100 UNIT/ML ~~LOC~~ SOLN
0.0000 [IU] | Freq: Three times a day (TID) | SUBCUTANEOUS | Status: DC
  Administered 2017-12-10: 2 [IU] via SUBCUTANEOUS

## 2017-12-09 MED ORDER — SODIUM CHLORIDE 0.9 % IV SOLN
250.0000 mL | INTRAVENOUS | Status: DC | PRN
  Administered 2017-12-12: 10 mL via INTRAVENOUS

## 2017-12-09 MED ORDER — MOVING RIGHT ALONG BOOK
Freq: Once | Status: AC
  Administered 2017-12-09: 12:00:00
  Filled 2017-12-09: qty 1

## 2017-12-09 MED ORDER — ACYCLOVIR 800 MG PO TABS
800.0000 mg | ORAL_TABLET | Freq: Every day | ORAL | Status: AC
  Administered 2017-12-09 – 2017-12-12 (×19): 800 mg via ORAL
  Filled 2017-12-09 (×20): qty 1

## 2017-12-09 MED ORDER — CHOLESTYRAMINE 4 G PO PACK
4.0000 g | PACK | Freq: Every day | ORAL | Status: DC
  Administered 2017-12-13: 4 g via ORAL
  Filled 2017-12-09 (×2): qty 1

## 2017-12-09 MED ORDER — SALICYLIC ACID 3 % EX LIQD: 1.0000 "application " | Freq: Every day | CUTANEOUS | Status: DC | PRN

## 2017-12-09 MED ORDER — FUROSEMIDE 10 MG/ML IJ SOLN
40.0000 mg | Freq: Once | INTRAMUSCULAR | Status: AC
  Administered 2017-12-09: 40 mg via INTRAVENOUS
  Filled 2017-12-09: qty 4

## 2017-12-09 MED ORDER — MECLIZINE HCL 25 MG PO TABS: 12.5000 mg | ORAL_TABLET | Freq: Two times a day (BID) | ORAL | Status: DC | PRN

## 2017-12-09 MED ORDER — ALUM & MAG HYDROXIDE-SIMETH 200-200-20 MG/5ML PO SUSP: 15.0000 mL | Freq: Four times a day (QID) | ORAL | Status: DC | PRN

## 2017-12-09 NOTE — Progress Notes (Signed)
2 Days Post-Op Procedure(s) (LRB): CORONARY ARTERY BYPASS GRAFTING (CABG) times three using left internal mammary artery and left endoscopically harvested saphenous vein graft (N/A) TRANSESOPHAGEAL ECHOCARDIOGRAM (TEE) (N/A) Subjective: Feels better this AM, but c/o feeling "swimmy headed"- predates surgery and not able to take a deep breath  Objective: Vital signs in last 24 hours: Temp:  [97.8 F (36.6 C)-99.1 F (37.3 C)] 99.1 F (37.3 C) (06/28 0400) Pulse Rate:  [80-95] 88 (06/28 0700) Cardiac Rhythm: Normal sinus rhythm (06/28 0400) Resp:  [14-27] 26 (06/28 0700) BP: (97-132)/(44-67) 130/66 (06/28 0700) SpO2:  [93 %-98 %] 97 % (06/28 0700) Arterial Line BP: (116-166)/(35-51) 143/46 (06/27 1045) Weight:  [205 lb 4 oz (93.1 kg)] 205 lb 4 oz (93.1 kg) (06/28 0500)  Hemodynamic parameters for last 24 hours:    Intake/Output from previous day: 06/27 0701 - 06/28 0700 In: 1101.4 [I.V.:518.9; IV Piggyback:447.5] Out: 685 [Urine:650; Chest Tube:35] Intake/Output this shift: No intake/output data recorded.  General appearance: alert, cooperative and no distress Neurologic: intact Heart: regular rate and rhythm Lungs: clear to auscultation bilaterally Abdomen: normal findings: mildly distended, + BS, nontender  Lab Results: Recent Labs    12/08/17 1611 12/08/17 1624 12/09/17 0340  WBC 12.3*  --  11.5*  HGB 8.2* 7.5* 7.6*  HCT 25.4* 22.0* 23.4*  PLT 74*  --  67*   BMET:  Recent Labs    12/08/17 0407  12/08/17 1624 12/09/17 0340  NA 137  --  138 137  K 4.1  --  3.9 4.0  CL 108  --  101 106  CO2 23  --   --  24  GLUCOSE 122*  --  132* 118*  BUN 13  --  18 19  CREATININE 1.35*   < > 1.60* 1.54*  CALCIUM 7.5*  --   --  7.7*   < > = values in this interval not displayed.    PT/INR:  Recent Labs    12/07/17 1343  LABPROT 17.2*  INR 1.42   ABG    Component Value Date/Time   PHART 7.366 12/07/2017 2152   HCO3 18.6 (L) 12/07/2017 2152   TCO2 21 (L)  12/08/2017 1624   ACIDBASEDEF 6.0 (H) 12/07/2017 2152   O2SAT 98.0 12/07/2017 2152   CBG (last 3)  Recent Labs    12/08/17 1943 12/08/17 2320 12/09/17 0329  GLUCAP 131* 149* 117*    Assessment/Plan: S/P Procedure(s) (LRB): CORONARY ARTERY BYPASS GRAFTING (CABG) times three using left internal mammary artery and left endoscopically harvested saphenous vein graft (N/A) TRANSESOPHAGEAL ECHOCARDIOGRAM (TEE) (N/A) -  CV- stable, in SR  ASA, beta blocker, statin  RESP- CXR looks good and sat 99% on 1 L [place]  Continue IS  RENAL- creatinine down slighty to 1.54  Volume overloaded- IV lasix this AM  ENDO- CBG well controlled  DC levemir, change SSI to AC and HS  Anemia- secondary to ABL- stable, follow  Thrombocytopenia- PLT down to 67 K, no bleeding- will dc enoxaparin and follow  Vertigo- hx of inner ear issues- Meclizine PRN  Shingles - right shoulder- change acyclovir to PO   LOS: 7 days    Melrose Nakayama 12/09/2017

## 2017-12-09 NOTE — Consult Note (Signed)
Spoke with [employer], she is aware of DC orders for IJ

## 2017-12-09 NOTE — Progress Notes (Signed)
Patient ID: Ronald MACKOWSKI Sr., male   DOB: [birth year], 83 y.o.   MRN: [id number] EVENING ROUNDS NOTE :     [address]       [place] [address]             [phone]                 2 Days Post-Op Procedure(s) (LRB): CORONARY ARTERY BYPASS GRAFTING (CABG) times three using left internal mammary artery and left endoscopically harvested saphenous vein graft (N/A) TRANSESOPHAGEAL ECHOCARDIOGRAM (TEE) (N/A)  Total Length of Stay:  LOS: 7 days  BP 120/64   Pulse 88   Temp 97.8 F (36.6 C) (Oral)   Resp (!) 21   Ht 6\' 1"  (1.854 m)   Wt 205 lb 4 oz (93.1 kg)   SpO2 97%   BMI 27.08 kg/m   .Intake/Output      06/28 0701 - 06/29 0700   P.O. 300   I.V. (mL/kg) 227.6 (2.4)   Other    IV Piggyback 13.9   Total Intake(mL/kg) 541.5 (5.8)   Urine (mL/kg/hr) 2050 (1.4)   Chest Tube    Total Output 2050   Net -1508.5         . sodium chloride Stopped (12/08/17 0914)  . sodium chloride    . sodium chloride 20 mL/hr at 12/08/17 0000  . sodium chloride    . dexmedetomidine (PRECEDEX) IV infusion Stopped (12/08/17 0515)  . lactated ringers    . lactated ringers 20 mL/hr at 12/09/17 2000  . nitroGLYCERIN Stopped (12/07/17 1500)  . phenylephrine (NEO-SYNEPHRINE) Adult infusion Stopped (12/08/17 0848)     Lab Results  Component Value Date   WBC 11.5 (H) 12/09/2017   HGB 7.6 (L) 12/09/2017   HCT 23.4 (L) 12/09/2017   PLT 67 (L) 12/09/2017   GLUCOSE 118 (H) 12/09/2017   CHOL 119 12/02/2017   TRIG 140 12/02/2017   HDL 36 (L) 12/02/2017   LDLCALC 55 12/02/2017   ALT 32 12/06/2017   AST 36 12/06/2017   NA 137 12/09/2017   K 4.0 12/09/2017   CL 106 12/09/2017   CREATININE 1.54 (H) 12/09/2017   BUN 19 12/09/2017   CO2 24 12/09/2017   TSH 4.230 11/23/2017   INR 1.42 12/07/2017   HGBA1C 5.3 12/02/2017   Stable day   Grace Isaac MD  Beeper [phone] Office [phone] 12/09/2017 11:12 PM

## 2017-12-10 ENCOUNTER — Inpatient Hospital Stay (HOSPITAL_COMMUNITY): Payer: PPO

## 2017-12-10 LAB — BASIC METABOLIC PANEL
Anion gap: 6 (ref 5–15)
BUN: 23 mg/dL (ref 8–23)
CO2: 27 mmol/L (ref 22–32)
Calcium: 7.6 mg/dL — ABNORMAL LOW (ref 8.9–10.3)
Chloride: 104 mmol/L (ref 98–111)
Creatinine, Ser: 1.45 mg/dL — ABNORMAL HIGH (ref 0.61–1.24)
GFR calc Af Amer: 51 mL/min — ABNORMAL LOW (ref >60)
GFR calc non Af Amer: 44 mL/min — ABNORMAL LOW (ref >60)
Glucose, Bld: 108 mg/dL — ABNORMAL HIGH (ref 70–99)
Potassium: 3.5 mmol/L (ref 3.5–5.1)
Sodium: 137 mmol/L (ref 135–145)

## 2017-12-10 LAB — TYPE AND SCREEN
ABO/RH(D): AB POS
Antibody Screen: NEGATIVE
Unit division: 0
Unit division: 0

## 2017-12-10 LAB — CBC
HCT: 22.9 % — ABNORMAL LOW (ref 39.0–52.0)
Hemoglobin: 7.5 g/dL — ABNORMAL LOW (ref 13.0–17.0)
MCH: 31 pg (ref 26.0–34.0)
MCHC: 32.8 g/dL (ref 30.0–36.0)
MCV: 94.6 fL (ref 78.0–100.0)
Platelets: 70 10*3/uL — ABNORMAL LOW (ref 150–400)
RBC: 2.42 MIL/uL — ABNORMAL LOW (ref 4.22–5.81)
RDW: 14.7 % (ref 11.5–15.5)
WBC: 9 10*3/uL (ref 4.0–10.5)

## 2017-12-10 LAB — GLUCOSE, CAPILLARY
Glucose-Capillary: 90 mg/dL (ref 70–99)
Glucose-Capillary: 99 mg/dL (ref 70–99)
Glucose-Capillary: 150 mg/dL — ABNORMAL HIGH (ref 70–99)
Glucose-Capillary: 85 mg/dL (ref 70–99)
Glucose-Capillary: 93 mg/dL (ref 70–99)

## 2017-12-10 LAB — BPAM RBC
Blood Product Expiration Date: 201907182359
Blood Product Expiration Date: 201907182359
ISSUE DATE / TIME: 201906261117
ISSUE DATE / TIME: 201906261117
Unit Type and Rh: 8400
Unit Type and Rh: 8400

## 2017-12-10 MED ORDER — POTASSIUM CHLORIDE CRYS ER 20 MEQ PO TBCR
20.0000 meq | EXTENDED_RELEASE_TABLET | ORAL | Status: AC | PRN
  Administered 2017-12-10 (×3): 20 meq via ORAL
  Filled 2017-12-10 (×3): qty 1

## 2017-12-10 MED ORDER — POTASSIUM CHLORIDE 10 MEQ/50ML IV SOLN: 10.0000 meq | INTRAVENOUS | Status: DC | PRN

## 2017-12-10 NOTE — Progress Notes (Signed)
Patient ID: Ronald HELLMER Sr., male   DOB: [birth year], 83 y.o.   MRN: [id number] TCTS DAILY ICU PROGRESS NOTE                   [address]            [place] [address]          [phone]   3 Days Post-Op Procedure(s) (LRB): CORONARY ARTERY BYPASS GRAFTING (CABG) times three using left internal mammary artery and left endoscopically harvested saphenous vein graft (N/A) TRANSESOPHAGEAL ECHOCARDIOGRAM (TEE) (N/A)  Total Length of Stay:  LOS: 8 days   Subjective: Now moved to floor, soreness left upper incision  Objective: Vital signs in last 24 hours: Temp:  [97.8 F (36.6 C)-99 F (37.2 C)] 98.5 F (36.9 C) (06/29 1620) Pulse Rate:  [79-105] 97 (06/29 1620) Cardiac Rhythm: Normal sinus rhythm;Bundle branch block (06/29 1622) Resp:  [15-27] 22 (06/29 1600) BP: (95-150)/(45-88) 133/84 (06/29 1620) SpO2:  [90 %-100 %] 100 % (06/29 1620) Weight:  [200 lb 13.4 oz (91.1 kg)] 200 lb 13.4 oz (91.1 kg) (06/29 0500)  Filed Weights   12/08/17 0500 12/09/17 0500 12/10/17 0500  Weight: 205 lb 11 oz (93.3 kg) 205 lb 4 oz (93.1 kg) 200 lb 13.4 oz (91.1 kg)    Weight change: -4 lb 6.6 oz (-2 kg)   Hemodynamic parameters for last 24 hours:    Intake/Output from previous day: 06/28 0701 - 06/29 0700 In: 627.6 [P.O.:350; I.V.:263.7; IV Piggyback:13.9] Out: 2475 [Urine:2475]  Intake/Output this shift: Total I/O In: 340 [P.O.:340] Out: 600 [Urine:600]  Current Meds: Scheduled Meds: . acetaminophen  1,000 mg Oral Q6H   Or  . acetaminophen (TYLENOL) oral liquid 160 mg/5 mL  1,000 mg Per Tube Q6H  . acyclovir  800 mg Oral 5 X Daily  . aspirin EC  325 mg Oral Daily   Or  . aspirin  324 mg Per Tube Daily  . bisacodyl  10 mg Oral Daily   Or  . bisacodyl  10 mg Rectal Daily  . [START ON 12/13/2017] cholestyramine  4 g Oral QAC lunch  . docusate sodium  200 mg Oral Daily  . insulin aspart  0-15 Units Subcutaneous TID WC  . loratadine  10 mg Oral Daily  . mouth  rinse  15 mL Mouth Rinse BID  . metoprolol tartrate  12.5 mg Oral BID   Or  . metoprolol tartrate  12.5 mg Per Tube BID  . pantoprazole  40 mg Oral BID  . pravastatin  40 mg Oral Daily  . sodium chloride flush  3 mL Intravenous Q12H   Continuous Infusions: . sodium chloride Stopped (12/08/17 0914)  . sodium chloride    . sodium chloride 20 mL/hr at 12/08/17 0000  . sodium chloride    . dexmedetomidine (PRECEDEX) IV infusion Stopped (12/08/17 0515)  . lactated ringers    . lactated ringers Stopped (12/09/17 2148)  . nitroGLYCERIN Stopped (12/07/17 1500)  . phenylephrine (NEO-SYNEPHRINE) Adult infusion Stopped (12/08/17 0848)   PRN Meds:.sodium chloride, sodium chloride, ALPRAZolam, alum & mag hydroxide-simeth, dextromethorphan-guaiFENesin, fluticasone, magnesium hydroxide, meclizine, metoprolol tartrate, midazolam, morphine injection, ondansetron (ZOFRAN) IV, oxyCODONE, potassium chloride, sodium chloride flush, traMADol  General appearance: alert, cooperative, appears stated age and no distress Neurologic: intact Heart: regular rate and rhythm, S1, S2 normal, no murmur, click, rub or gallop Lungs: diminished breath sounds bibasilar Abdomen: soft, non-tender; bowel sounds normal; no masses,  no organomegaly Extremities: extremities  normal, atraumatic, no cyanosis or edema Wound: sternum intact  Lab Results: CBC: Recent Labs    12/09/17 0340 12/10/17 0249  WBC 11.5* 9.0  HGB 7.6* 7.5*  HCT 23.4* 22.9*  PLT 67* 70*   BMET:  Recent Labs    12/09/17 0340 12/10/17 0249  NA 137 137  K 4.0 3.5  CL 106 104  CO2 24 27  GLUCOSE 118* 108*  BUN 19 23  CREATININE 1.54* 1.45*  CALCIUM 7.7* 7.6*    CMET: Lab Results  Component Value Date   WBC 9.0 12/10/2017   HGB 7.5 (L) 12/10/2017   HCT 22.9 (L) 12/10/2017   PLT 70 (L) 12/10/2017   GLUCOSE 108 (H) 12/10/2017   CHOL 119 12/02/2017   TRIG 140 12/02/2017   HDL 36 (L) 12/02/2017   LDLCALC 55 12/02/2017   ALT 32  12/06/2017   AST 36 12/06/2017   NA 137 12/10/2017   K 3.5 12/10/2017   CL 104 12/10/2017   CREATININE 1.45 (H) 12/10/2017   BUN 23 12/10/2017   CO2 27 12/10/2017   TSH 4.230 11/23/2017   INR 1.42 12/07/2017   HGBA1C 5.3 12/02/2017      PT/INR: No results for input(s): LABPROT, INR in the last 72 hours. Radiology: Dg Chest 2 View  Result Date: 12/10/2017 CLINICAL DATA:  Status post CABG EXAM: CHEST - 2 VIEW COMPARISON:  Yesterday FINDINGS: Vascular sheath has been removed. Artifact from EKG leads. Stable cardiopericardial size and contours after CABG. Low volumes with atelectasis. Borderline improvement in aeration. Small volume pleural fluid seen in the lateral view. IMPRESSION: Low volumes with atelectasis and small volume pleural fluid. Electronically Signed   By: Monte Fantasia M.D.   On: 12/10/2017 08:24     Assessment/Plan: S/P Procedure(s) (LRB): CORONARY ARTERY BYPASS GRAFTING (CABG) times three using left internal mammary artery and left endoscopically harvested saphenous vein graft (N/A) TRANSESOPHAGEAL ECHOCARDIOGRAM (TEE) (N/A) Mobilize Diuresis Some chest discomfort left upper chest Work on ambulation  Cr stable    Grace Isaac 12/10/2017 6:04 PM

## 2017-12-10 NOTE — Progress Notes (Signed)
Pt trans from [place]. A&Ox4;VSS. All incisions dry and intact. Pt ambulate approx 54ft to get in bed. Pt oriented to room. Call bell within reach. Will continue to monitor.

## 2017-12-11 LAB — CBC
HCT: 22.9 % — ABNORMAL LOW (ref 39.0–52.0)
Hemoglobin: 7.4 g/dL — ABNORMAL LOW (ref 13.0–17.0)
MCH: 30.8 pg (ref 26.0–34.0)
MCHC: 32.3 g/dL (ref 30.0–36.0)
MCV: 95.4 fL (ref 78.0–100.0)
Platelets: 102 10*3/uL — ABNORMAL LOW (ref 150–400)
RBC: 2.4 MIL/uL — ABNORMAL LOW (ref 4.22–5.81)
RDW: 14.9 % (ref 11.5–15.5)
WBC: 8.4 10*3/uL (ref 4.0–10.5)

## 2017-12-11 LAB — BASIC METABOLIC PANEL
Anion gap: 4 — ABNORMAL LOW (ref 5–15)
BUN: 22 mg/dL (ref 8–23)
CO2: 28 mmol/L (ref 22–32)
Calcium: 7.6 mg/dL — ABNORMAL LOW (ref 8.9–10.3)
Chloride: 107 mmol/L (ref 98–111)
Creatinine, Ser: 1.38 mg/dL — ABNORMAL HIGH (ref 0.61–1.24)
GFR calc Af Amer: 54 mL/min — ABNORMAL LOW (ref >60)
GFR calc non Af Amer: 46 mL/min — ABNORMAL LOW (ref >60)
Glucose, Bld: 106 mg/dL — ABNORMAL HIGH (ref 70–99)
Potassium: 3.6 mmol/L (ref 3.5–5.1)
Sodium: 139 mmol/L (ref 135–145)

## 2017-12-11 LAB — GLUCOSE, CAPILLARY
Glucose-Capillary: 104 mg/dL — ABNORMAL HIGH (ref 70–99)
Glucose-Capillary: 114 mg/dL — ABNORMAL HIGH (ref 70–99)
Glucose-Capillary: 85 mg/dL (ref 70–99)
Glucose-Capillary: 120 mg/dL — ABNORMAL HIGH (ref 70–99)

## 2017-12-11 MED ORDER — ZOLPIDEM TARTRATE 5 MG PO TABS
5.0000 mg | ORAL_TABLET | Freq: Every evening | ORAL | Status: DC | PRN
Start: 2017-12-11 — End: 2017-12-13
  Administered 2017-12-11 – 2017-12-12 (×2): 5 mg via ORAL
  Filled 2017-12-11 (×2): qty 1

## 2017-12-11 NOTE — Progress Notes (Signed)
Patient ambulated 251ft in the hall using a walker,ambulation well tolerated, patient not compliant with laying on his back while in the bed, patient said he sleeps on his side at home in order to be able to breath, sternal precautions reinforced with patient he verbalised understanding will continue to monitor.

## 2017-12-11 NOTE — Progress Notes (Signed)
Pt ambulated 1110ft in hall on RA. Pt tolerated well.Pt back to bed with call bell within reach. Will continue to monitor pt.  Chauncey Reading

## 2017-12-11 NOTE — Progress Notes (Addendum)
[address]       [place], [address]             [phone]      4 Days Post-Op Procedure(s) (LRB): CORONARY ARTERY BYPASS GRAFTING (CABG) times three using left internal mammary artery and left endoscopically harvested saphenous vein graft (N/A) TRANSESOPHAGEAL ECHOCARDIOGRAM (TEE) (N/A) Subjective: Had horrible nightmares after xanax  Otherwise feels pretty well   Objective: Vital signs in last 24 hours: Temp:  [97.6 F (36.4 C)-98.5 F (36.9 C)] 97.6 F (36.4 C) (06/29 2240) Pulse Rate:  [85-105] 97 (06/29 1620) Cardiac Rhythm: Normal sinus rhythm (06/30 0716) Resp:  [15-27] 22 (06/29 1600) BP: (101-143)/(49-88) 122/62 (06/29 2240) SpO2:  [92 %-100 %] 96 % (06/29 2240) Weight:  [95.5 kg (210 lb 8.6 oz)] 95.5 kg (210 lb 8.6 oz) (06/30 0433)  Hemodynamic parameters for last 24 hours:    Intake/Output from previous day: 06/29 0701 - 06/30 0700 In: 823 [P.O.:820; I.V.:3] Out: 950 [Urine:950] Intake/Output this shift: No intake/output data recorded.  General appearance: alert, cooperative and no distress Heart: regular rate and rhythm Lungs: mildly dim in left>right base Abdomen: benign Extremities: min edema Wound: incis healing well  Lab Results: Recent Labs    12/10/17 0249 12/11/17 0354  WBC 9.0 8.4  HGB 7.5* 7.4*  HCT 22.9* 22.9*  PLT 70* 102*   BMET:  Recent Labs    12/10/17 0249 12/11/17 0354  NA 137 139  K 3.5 3.6  CL 104 107  CO2 27 28  GLUCOSE 108* 106*  BUN 23 22  CREATININE 1.45* 1.38*  CALCIUM 7.6* 7.6*    PT/INR: No results for input(s): LABPROT, INR in the last 72 hours. ABG    Component Value Date/Time   PHART 7.366 12/07/2017 2152   HCO3 18.6 (L) 12/07/2017 2152   TCO2 21 (L) 12/08/2017 1624   ACIDBASEDEF 6.0 (H) 12/07/2017 2152   O2SAT 98.0 12/07/2017 2152   CBG (last 3)  Recent Labs    12/10/17 1645 12/10/17 2208 12/11/17 0633  GLUCAP 85 93 104*    Meds Scheduled Meds: . acetaminophen   1,000 mg Oral Q6H   Or  . acetaminophen (TYLENOL) oral liquid 160 mg/5 mL  1,000 mg Per Tube Q6H  . acyclovir  800 mg Oral 5 X Daily  . aspirin EC  325 mg Oral Daily   Or  . aspirin  324 mg Per Tube Daily  . bisacodyl  10 mg Oral Daily   Or  . bisacodyl  10 mg Rectal Daily  . [START ON 12/13/2017] cholestyramine  4 g Oral QAC lunch  . docusate sodium  200 mg Oral Daily  . insulin aspart  0-15 Units Subcutaneous TID WC  . loratadine  10 mg Oral Daily  . mouth rinse  15 mL Mouth Rinse BID  . metoprolol tartrate  12.5 mg Oral BID   Or  . metoprolol tartrate  12.5 mg Per Tube BID  . pantoprazole  40 mg Oral BID  . pravastatin  40 mg Oral Daily  . sodium chloride flush  3 mL Intravenous Q12H   Continuous Infusions: . sodium chloride Stopped (12/08/17 0914)  . sodium chloride    . sodium chloride 20 mL/hr at 12/08/17 0000  . sodium chloride    . dexmedetomidine (PRECEDEX) IV infusion Stopped (12/08/17 0515)  . lactated ringers    . lactated ringers Stopped (12/09/17 2148)  . nitroGLYCERIN Stopped (12/07/17 1500)  .  phenylephrine (NEO-SYNEPHRINE) Adult infusion Stopped (12/08/17 0848)   PRN Meds:.sodium chloride, sodium chloride, ALPRAZolam, alum & mag hydroxide-simeth, dextromethorphan-guaiFENesin, fluticasone, magnesium hydroxide, meclizine, metoprolol tartrate, midazolam, morphine injection, ondansetron (ZOFRAN) IV, oxyCODONE, sodium chloride flush, traMADol  Xrays Dg Chest 2 View  Result Date: 12/10/2017 CLINICAL DATA:  Status post CABG EXAM: CHEST - 2 VIEW COMPARISON:  Yesterday FINDINGS: Vascular sheath has been removed. Artifact from EKG leads. Stable cardiopericardial size and contours after CABG. Low volumes with atelectasis. Borderline improvement in aeration. Small volume pleural fluid seen in the lateral view. IMPRESSION: Low volumes with atelectasis and small volume pleural fluid. Electronically Signed   By: Monte Fantasia M.D.   On: 12/10/2017 08:24     Assessment/Plan: S/P Procedure(s) (LRB): CORONARY ARTERY BYPASS GRAFTING (CABG) times three using left internal mammary artery and left endoscopically harvested saphenous vein graft (N/A) TRANSESOPHAGEAL ECHOCARDIOGRAM (TEE) (N/A)  1 steady progress 2 hemodyn stable in sinus rhythm. SBP 101- 140 range, will hold on ACE-ARB for now with some renal insuff 3 sats ok on 2-liters- wean off, + nonproductive cough, cont pulm toilet and rehab 4 H/H stable but close to transfusion threshold- monitor clinically 5 cont routine rehab- educated on sternal precautions. 6 renal fxn improved 7 stop insulin. A1C is 5.3 8 d/c epw's today 9 d/c xanax add prn low dose ambien which has taken before for short term insomnia aid   LOS: 9 days    Ronald Russell 12/11/2017 I have seen and examined Ronald Fear Sr. and agree with the above assessment  and plan.  Grace Isaac MD Beeper [phone] Office [phone] 12/11/2017 2:42 PM

## 2017-12-11 NOTE — Progress Notes (Signed)
Patient ambulated 15ft in the hall using a walker on room air, ambulation well tolerated.

## 2017-12-11 NOTE — Progress Notes (Signed)
Pt pacing wires removed per MD order. Pt tolerated well; tips intact;VSS. Steri strips applied. Pt educated and aware that he is on bedrest until 12:16pm. Will continue to monitor pt.  Ronald Russell

## 2017-12-11 NOTE — Progress Notes (Signed)
Pt refused to stay on his back while on bedrest after wires removed. Pt stated he cannot tolerate it. Will continue to monitor.  Lilla Shook, BSN

## 2017-12-12 DIAGNOSIS — Z951 Presence of aortocoronary bypass graft: Secondary | ICD-10-CM

## 2017-12-12 LAB — GLUCOSE, CAPILLARY
Glucose-Capillary: 111 mg/dL — ABNORMAL HIGH (ref 70–99)
Glucose-Capillary: 94 mg/dL (ref 70–99)

## 2017-12-12 LAB — PREPARE RBC (CROSSMATCH)

## 2017-12-12 MED ORDER — POTASSIUM CHLORIDE CRYS ER 20 MEQ PO TBCR
40.0000 meq | EXTENDED_RELEASE_TABLET | Freq: Every day | ORAL | Status: DC
  Administered 2017-12-12 – 2017-12-14 (×3): 40 meq via ORAL
  Filled 2017-12-12 (×3): qty 2

## 2017-12-12 MED ORDER — CAPSAICIN 0.025 % EX CREA
TOPICAL_CREAM | Freq: Two times a day (BID) | CUTANEOUS | Status: DC
  Administered 2017-12-12 – 2017-12-14 (×5): via TOPICAL
  Filled 2017-12-12: qty 60

## 2017-12-12 MED ORDER — METOPROLOL TARTRATE 25 MG PO TABS
25.0000 mg | ORAL_TABLET | Freq: Two times a day (BID) | ORAL | Status: DC
  Administered 2017-12-12 – 2017-12-14 (×5): 25 mg via ORAL
  Filled 2017-12-12 (×5): qty 1

## 2017-12-12 NOTE — Progress Notes (Signed)
Patient walked 641ft in the hall using a walker on room air, ambulation well tolerated will continue to monitor.

## 2017-12-12 NOTE — Progress Notes (Signed)
Patient refused to ambulate at this time, patient encouraged to walk in the hall later night.

## 2017-12-12 NOTE — Progress Notes (Signed)
Patient had major open heart surgery.  Had nice visit with he and his wife.  He had many things to talk about from his pentecostal background.  Enjoyed the visit and had prayer for he and his wife for peace in their hearts and minds during this time of recovery.  Prayers for staff and all giving care for their healing work.  The patient seems to need lots of encouragement through the healing process-its hard for someone at his age.  Hope to be able to visit with he and his wife again.  Shared story from 4:35-Jesus Calms a Storm as he requested something from scripture.  KEMP GOMES, Chaplain   12/12/17 1600  Clinical Encounter Type  Visited With Patient and family together  Visit Type Follow-up;Spiritual support;Post-op  Referral From Patient;Nurse  Consult/Referral To Chaplain  Spiritual Encounters  Spiritual Needs Sacred text;Prayer  Stress Factors  Patient Stress Factors Health changes  Family Stress Factors Health changes

## 2017-12-12 NOTE — Progress Notes (Addendum)
      [address]       [place] [address]             [phone]      5 Days Post-Op Procedure(s) (LRB): CORONARY ARTERY BYPASS GRAFTING (CABG) times three using left internal mammary artery and left endoscopically harvested saphenous vein graft (N/A) TRANSESOPHAGEAL ECHOCARDIOGRAM (TEE) (N/A)   Subjective:  States his neck hurts.  Otherwise is doing well.  He states he felt his heart fluttering last night.  Objective: Vital signs in last 24 hours: Temp:  [97.7 F (36.5 C)-98.6 F (37 C)] 98.3 F (36.8 C) (07/01 0529) Pulse Rate:  [86-95] 92 (07/01 0529) Cardiac Rhythm: Normal sinus rhythm (07/01 0705) Resp:  [20-23] 20 (07/01 0005) BP: (96-146)/(40-81) 118/47 (07/01 0529) SpO2:  [95 %-100 %] 95 % (07/01 0529) Weight:  [197 lb 14.4 oz (89.8 kg)] 197 lb 14.4 oz (89.8 kg) (07/01 0535)  Intake/Output from previous day: 06/30 0701 - 07/01 0700 In: 723 [P.O.:720; I.V.:3] Out: 575 [Urine:575]  General appearance: alert, cooperative and no distress Heart: regular rate and rhythm Lungs: clear to auscultation bilaterally Abdomen: soft, non-tender; bowel sounds normal; no masses,  no organomegaly Extremities: edema trace, ecchymosis RLE Wound: clean and dry  Lab Results: Recent Labs    12/10/17 0249 12/11/17 0354  WBC 9.0 8.4  HGB 7.5* 7.4*  HCT 22.9* 22.9*  PLT 70* 102*   BMET:  Recent Labs    12/10/17 0249 12/11/17 0354  NA 137 139  K 3.5 3.6  CL 104 107  CO2 27 28  GLUCOSE 108* 106*  BUN 23 22  CREATININE 1.45* 1.38*  CALCIUM 7.6* 7.6*    PT/INR: No results for input(s): LABPROT, INR in the last 72 hours. ABG    Component Value Date/Time   PHART 7.366 12/07/2017 2152   HCO3 18.6 (L) 12/07/2017 2152   TCO2 21 (L) 12/08/2017 1624   ACIDBASEDEF 6.0 (H) 12/07/2017 2152   O2SAT 98.0 12/07/2017 2152   CBG (last 3)  Recent Labs    12/11/17 1650 12/11/17 2133 12/12/17 0627  GLUCAP 85 120* 111*    Assessment/Plan: S/P  Procedure(s) (LRB): CORONARY ARTERY BYPASS GRAFTING (CABG) times three using left internal mammary artery and left endoscopically harvested saphenous vein graft (N/A) TRANSESOPHAGEAL ECHOCARDIOGRAM (TEE) (N/A)  1. CV- PAF overnight, NSR this morning- will increase Lopressor at 25 mg BID, may need anticoagulation 2. Pulm- no acute issues, continue 3. Renal- creatinine trending down, at 1.38, K is on the low side at 3.6, will supplement with A. Fib needs to be closer to 4 4. Expected post operative blood loss anemia, Hgb 7.4.. May benefit from transfusion will discuss with staff 5. Thrombocytopenia- improving up to 102, per patient has chronic history of low platelets 6. CBGs controlled, will stop glucose checks 7. Dispo- patient with PAF, increase Lopressor to 25 mg BID, may benefit from anticoagulation, however with low Hgb a transfusion may help with rhythm issues, supplement K, will discuss management with staff   LOS: 10 days    Ellwood Handler 12/12/2017  paf hgb low , blood given today  Hold off on anticoagulation  I have seen and examined Ronald Russell Sr. and agree with the above assessment  and plan.  Grace Isaac MD Beeper [phone] Office [phone] 12/12/2017 2:50 PM

## 2017-12-12 NOTE — Progress Notes (Signed)
DAILY PROGRESS NOTE   Patient Name: Ronald FANCHER Sr. Date of Encounter: 12/12/2017  Chief Complaint   Now s/p 3V CABG 12/07/17  Patient Profile   Ronald Russell is an 83 yo man who recently underwent 3V CABG on 12/07/08. History   Subjective  Had difficulties with night terrors, still feels groggy after receiving Ambien last night. No chest pain or shortness of breath, has been ambulating. Feels like he can't breath during night terrors but O2 sats fine. Wife denies that he snores often, occasionally will notice him snore/take a sudden breath at night. Patient states he is a light sleeper. Denies history of OSA.  Discussed telemetry with patient. There was some concern for paroxysmal atrial fibrillation. There is a lot of noise on the tele but most of irregular rhythms with clear baseline are sinus with PACs. However, the 12 lead yesterday is consistent with atrial fibrillation with RVR. We did discuss at length what atrial fibrillation is, what the risks are, anticoagulation, medical treatment for either rate or rhythm control, risk factors, etc. Patient and his wife had many appropriate questions.  ROS negative for chest pain, PND, orthopnea, syncope. No fevers/chills. No gross bleeding. Positive for sleep difficulties as above.  Objective   Vitals:   12/12/17 0800 12/12/17 1121 12/12/17 1253 12/12/17 1318  BP:  135/67 (!) 142/64 123/63  Pulse:  87 88 88  Resp: 20 (!) 27 (!) 26 19  Temp:  97.9 F (36.6 C) (!) 97.5 F (36.4 C) 98.2 F (36.8 C)  TempSrc:  Oral Oral Oral  SpO2:  98% 97% 99%  Weight:      Height:        Intake/Output Summary (Last 24 hours) at 12/12/2017 1422 Last data filed at 12/12/2017 1300 Gross per 24 hour  Intake 483 ml  Output 875 ml  Net -392 ml   Filed Weights   12/10/17 0500 12/11/17 0433 12/12/17 0535  Weight: 200 lb 13.4 oz (91.1 kg) 210 lb 8.6 oz (95.5 kg) 197 lb 14.4 oz (89.8 kg)    Physical Exam   General appearance: alert, cooperative and  appears stated age Neck: no JVD, supple, symmetrical, trachea midline and thyroid not enlarged, symmetric, no tenderness/mass/nodules Lungs: clear to auscultation bilaterally Heart: regular rate and rhythm, S1, S2 normal, no murmur, click, rub or gallop Abdomen: soft, non-tender; bowel sounds normal; no masses,  no organomegaly Extremities: varicose veins noted and no edema on left, mild edema and bruising with varicose veins on right leg Pulses: 2+ and symmetric Skin: bruising on R upper leg continuing to calf Neurologic: Grossly normal  Inpatient Medications    Scheduled Meds:  acetaminophen  1,000 mg Oral Q6H   Or   acetaminophen (TYLENOL) oral liquid 160 mg/5 mL  1,000 mg Per Tube Q6H   acyclovir  800 mg Oral 5 X Daily   aspirin EC  325 mg Oral Daily   Or   aspirin  324 mg Per Tube Daily   bisacodyl  10 mg Oral Daily   Or   bisacodyl  10 mg Rectal Daily   capsaicin   Topical BID   [START ON 12/13/2017] cholestyramine  4 g Oral QAC lunch   docusate sodium  200 mg Oral Daily   loratadine  10 mg Oral Daily   mouth rinse  15 mL Mouth Rinse BID   metoprolol tartrate  25 mg Oral BID   pantoprazole  40 mg Oral BID   potassium chloride  40 mEq  Oral Daily   pravastatin  40 mg Oral Daily   sodium chloride flush  3 mL Intravenous Q12H    Continuous Infusions:  sodium chloride Stopped (12/08/17 0914)   sodium chloride     sodium chloride 20 mL/hr at 12/08/17 0000   sodium chloride     dexmedetomidine (PRECEDEX) IV infusion Stopped (12/08/17 0515)   lactated ringers     lactated ringers Stopped (12/09/17 2148)    PRN Meds: sodium chloride, sodium chloride, alum & mag hydroxide-simeth, dextromethorphan-guaiFENesin, fluticasone, magnesium hydroxide, meclizine, metoprolol tartrate, midazolam, ondansetron (ZOFRAN) IV, oxyCODONE, sodium chloride flush, traMADol, zolpidem   Labs   Results for orders placed or performed during the hospital encounter of 12/01/17  (from the past 48 hour(s))  Glucose, capillary     Status: None   Collection Time: 12/10/17  4:45 PM  Result Value Ref Range   Glucose-Capillary 85 70 - 99 mg/dL   Comment 1 Notify RN    Comment 2 Document in Chart   Glucose, capillary     Status: None   Collection Time: 12/10/17 10:08 PM  Result Value Ref Range   Glucose-Capillary 93 70 - 99 mg/dL  Basic metabolic panel     Status: Abnormal   Collection Time: 12/11/17  3:54 AM  Result Value Ref Range   Sodium 139 135 - 145 mmol/L   Potassium 3.6 3.5 - 5.1 mmol/L   Chloride 107 98 - 111 mmol/L    Comment: Please note change in reference range.   CO2 28 22 - 32 mmol/L   Glucose, Bld 106 (H) 70 - 99 mg/dL    Comment: Please note change in reference range.   BUN 22 8 - 23 mg/dL    Comment: Please note change in reference range.   Creatinine, Ser 1.38 (H) 0.61 - 1.24 mg/dL   Calcium 7.6 (L) 8.9 - 10.3 mg/dL   GFR calc non Af Amer 46 (L) >60 mL/min   GFR calc Af Amer 54 (L) >60 mL/min    Comment: (NOTE) The eGFR has been calculated using the CKD EPI equation. This calculation has not been validated in all clinical situations. eGFR's persistently <60 mL/min signify possible Chronic Kidney Disease.    Anion gap 4 (L) 5 - 15    Comment: Performed at [facility] [address], [place], [place] [address]  CBC     Status: Abnormal   Collection Time: 12/11/17  3:54 AM  Result Value Ref Range   WBC 8.4 4.0 - 10.5 K/uL   RBC 2.40 (L) 4.22 - 5.81 MIL/uL   Hemoglobin 7.4 (L) 13.0 - 17.0 g/dL   HCT 22.9 (L) 39.0 - 52.0 %   MCV 95.4 78.0 - 100.0 fL   MCH 30.8 26.0 - 34.0 pg   MCHC 32.3 30.0 - 36.0 g/dL   RDW 14.9 11.5 - 15.5 %   Platelets 102 (L) 150 - 400 K/uL    Comment: CONSISTENT WITH PREVIOUS RESULT Performed at [facility], [place] [address], [place], [place] [address]   Glucose, capillary     Status: Abnormal   Collection Time: 12/11/17  6:33 AM  Result Value Ref Range   Glucose-Capillary 104 (H) 70 - 99 mg/dL    Glucose, capillary     Status: Abnormal   Collection Time: 12/11/17 11:46 AM  Result Value Ref Range   Glucose-Capillary 114 (H) 70 - 99 mg/dL   Comment 1 Notify RN    Comment 2 Document in  Chart   Glucose, capillary     Status: None   Collection Time: 12/11/17  4:50 PM  Result Value Ref Range   Glucose-Capillary 85 70 - 99 mg/dL   Comment 1 Notify RN    Comment 2 Document in Chart   Glucose, capillary     Status: Abnormal   Collection Time: 12/11/17  9:33 PM  Result Value Ref Range   Glucose-Capillary 120 (H) 70 - 99 mg/dL  Glucose, capillary     Status: Abnormal   Collection Time: 12/12/17  6:27 AM  Result Value Ref Range   Glucose-Capillary 111 (H) 70 - 99 mg/dL  Type and screen [place]     Status: None (Preliminary result)   Collection Time: 12/12/17 10:30 AM  Result Value Ref Range   ABO/RH(D) AB POS    Antibody Screen NEG    Sample Expiration 12/15/2017    Unit Number [id number]    Blood Component Type RED CELLS,LR    Unit division 00    Status of Unit ISSUED    Transfusion Status OK TO TRANSFUSE    Crossmatch Result      Compatible Performed at [facility], [address], [place], [place] [address]   Prepare RBC     Status: None   Collection Time: 12/12/17 10:47 AM  Result Value Ref Range   Order Confirmation      ORDER PROCESSED BY BLOOD BANK Performed at [facility], [place] [address] [place], [place] [address]     ECG   NSR with RBBB - Personally Reviewed  Telemetry   NSR with intermittent PACs, artifactual baseline but no clear sustained atrial fibrillation - Personally Reviewed  Radiology    No results found.  Cardiac Studies   Cath from 12/02/17 personally reviewed Nuclear stress test from 11/24/17 personally reviewed  Assessment   1. Principal Problem: 2.   Unstable angina ([place]) 3. Active Problems: 4.   Hypertension 5.   GAD (generalized anxiety disorder) 6.   HLD (hyperlipidemia) 7.   Coronary  artery disease involving native coronary artery of native heart with unstable angina pectoris ([place]) 8.   S/P CABG x 3 9. chronic thrombocytopenia  Plan   10. CAD s/p 3 V CABG. -Doing well postoperatively. Ambulating well. Still struggling with night terrors, feeling groggy after Ambien last night. -given his longstanding thrombocytopenia, would minimize antiplatelet therapy. Recommend change to 81 mg aspirin daily. -continue metoprolol and statin, can consider increasing statin intensity -ambulating -follow up with Dr. Harrington Challenger post discharge  11. Atrial fibrillation, postoperative -Appears most consistent with intermittent sinus rhythm with PACs.some baseline artifact in sections, but did not see significant prolonged atrial fibrillation.  If atrial fibrillation develops or persists, anticoagulation is a difficult issue. Chronic ITP and current postoperative blood loss anemia puts him at risk for bleeding, would not anticoagulate now. If he has repeated or sustained episodes of afib this will have to be re-evaluated. -spent considerable time discussing afib triggers, mechanism, risks, and treatment with patient and his wife -if afib persists, consider sleep study as outpatient   Time Spent Directly with Patient:  I have spent a total of >35 minutes with the patient reviewing hospital notes, telemetry, EKGs, labs and examining the patient as well as establishing an assessment and plan that was discussed personally with the patient.  > 50% of time was spent in direct patient care.  Length of Stay:  LOS: 10 days   Buford Dresser, MD,  PhD [facility]   Bridgette A Christopher 12/12/2017, 2:22 PM

## 2017-12-12 NOTE — Progress Notes (Signed)
Pt getting blood and declined walking. Discussed walking with pt and wife. Discussed using rollator since his back hurts and he might benefit from sitting and resting. Encouraged pt to use IS. He has not walked today but I could not convince him to walk now. Made his room RW slide better.  [place] CES, ACSM 2:24 PM 12/12/2017

## 2017-12-13 LAB — BASIC METABOLIC PANEL
Anion gap: 6 (ref 5–15)
BUN: 13 mg/dL (ref 8–23)
CO2: 25 mmol/L (ref 22–32)
Calcium: 7.7 mg/dL — ABNORMAL LOW (ref 8.9–10.3)
Chloride: 107 mmol/L (ref 98–111)
Creatinine, Ser: 1.33 mg/dL — ABNORMAL HIGH (ref 0.61–1.24)
GFR calc Af Amer: 56 mL/min — ABNORMAL LOW (ref >60)
GFR calc non Af Amer: 48 mL/min — ABNORMAL LOW (ref >60)
Glucose, Bld: 94 mg/dL (ref 70–99)
Potassium: 3.3 mmol/L — ABNORMAL LOW (ref 3.5–5.1)
Sodium: 138 mmol/L (ref 135–145)

## 2017-12-13 LAB — CBC
HCT: 27.1 % — ABNORMAL LOW (ref 39.0–52.0)
Hemoglobin: 8.8 g/dL — ABNORMAL LOW (ref 13.0–17.0)
MCH: 30.7 pg (ref 26.0–34.0)
MCHC: 32.5 g/dL (ref 30.0–36.0)
MCV: 94.4 fL (ref 78.0–100.0)
Platelets: 143 10*3/uL — ABNORMAL LOW (ref 150–400)
RBC: 2.87 MIL/uL — ABNORMAL LOW (ref 4.22–5.81)
RDW: 15.3 % (ref 11.5–15.5)
WBC: 6.5 10*3/uL (ref 4.0–10.5)

## 2017-12-13 LAB — TYPE AND SCREEN
ABO/RH(D): AB POS
Antibody Screen: NEGATIVE
Unit division: 0

## 2017-12-13 LAB — BPAM RBC
Blood Product Expiration Date: 201907182359
ISSUE DATE / TIME: 201907011247
Unit Type and Rh: 8400

## 2017-12-13 MED ORDER — ASPIRIN EC 81 MG PO TBEC
81.0000 mg | DELAYED_RELEASE_TABLET | Freq: Every day | ORAL | Status: DC
  Administered 2017-12-14: 81 mg via ORAL
  Filled 2017-12-13: qty 1

## 2017-12-13 MED ORDER — ACETAMINOPHEN 500 MG PO TABS
1000.0000 mg | ORAL_TABLET | Freq: Four times a day (QID) | ORAL | Status: DC | PRN
  Administered 2017-12-13 – 2017-12-14 (×4): 1000 mg via ORAL
  Filled 2017-12-13 (×4): qty 2

## 2017-12-13 MED ORDER — DIPHENHYDRAMINE HCL 25 MG PO CAPS
25.0000 mg | ORAL_CAPSULE | Freq: Every evening | ORAL | Status: DC | PRN
  Administered 2017-12-13: 25 mg via ORAL
  Filled 2017-12-13: qty 1

## 2017-12-13 MED ORDER — ACETAMINOPHEN 325 MG PO TABS
650.0000 mg | ORAL_TABLET | Freq: Four times a day (QID) | ORAL | Status: DC | PRN
  Administered 2017-12-13: 650 mg via ORAL
  Filled 2017-12-13: qty 2

## 2017-12-13 NOTE — Progress Notes (Signed)
CARDIAC REHAB PHASE I   PRE:  Rate/Rhythm: 82 SR    BP: sitting 99/60    SaO2: 98 RA  MODE:  Ambulation: 470 ft   POST:  Rate/Rhythm: 94 SR    BP: sitting 149/78     SaO2: 99 RA  Pt c/o back pain. Massaged his left shoulder and felt better. Standing well, used rollator walking so he could sit when his back was hurting. Sat x2 for rest, increased distance today. Gave reminders to stand tall while walking. Return to bed, no afib, BP stable.  [phone]   [place], ACSM 12/13/2017 11:45 AM

## 2017-12-13 NOTE — Progress Notes (Signed)
DAILY PROGRESS NOTE   Patient Name: Ronald DWAN Sr. Date of Encounter: 12/13/2017  Patient Profile   Now s/p 3V CABG 12/07/17  Subjective   Doing very well, other than continued night terrors with Ambien. Thinking about alternative such as melatonin. Ambulated with cardiac rehab today, no chest pain. Generally feels more fatigued than his prior baseline but gradually improving. Looking forward to going home in the near future.   Objective   Vitals:   12/12/17 2107 12/13/17 0319 12/13/17 0905 12/13/17 1319  BP:  136/70 136/63 127/71  Pulse:    88  Resp:  (!) 23 (!) 26 (!) 21  Temp: 98.3 F (36.8 C) 98.5 F (36.9 C) 98.1 F (36.7 C) 97.7 F (36.5 C)  TempSrc: Oral Oral Oral Oral  SpO2:  99% 100% 98%  Weight:  197 lb 8 oz (89.6 kg)    Height:        Intake/Output Summary (Last 24 hours) at 12/13/2017 1637 Last data filed at 12/13/2017 0943 Gross per 24 hour  Intake 360 ml  Output 1050 ml  Net -690 ml   Filed Weights   12/11/17 0433 12/12/17 0535 12/13/17 0319  Weight: 210 lb 8.6 oz (95.5 kg) 197 lb 14.4 oz (89.8 kg) 197 lb 8 oz (89.6 kg)    Physical Exam   General appearance: alert, cooperative and appears stated age Neck: no JVD, supple, symmetrical, trachea midline and thyroid not enlarged, symmetric, no tenderness/mass/nodules Lungs: clear to auscultation bilaterally Heart: regular rate and rhythm, S1, S2 normal, no murmur, click, rub or gallop Abdomen: soft, non-tender; bowel sounds normal; no masses,  no organomegaly Extremities: small scab lesions over anterior right shin at site of chronic scar tissue. Mild RLE edema, no edema on left Pulses: 2+ and symmetric Skin: chronic scar on RLE anterior Neurologic: Grossly normal  Inpatient Medications    Scheduled Meds: . aspirin EC  325 mg Oral Daily   Or  . aspirin  324 mg Per Tube Daily  . bisacodyl  10 mg Oral Daily   Or  . bisacodyl  10 mg Rectal Daily  . capsaicin   Topical BID  . cholestyramine  4 g  Oral QAC lunch  . docusate sodium  200 mg Oral Daily  . loratadine  10 mg Oral Daily  . mouth rinse  15 mL Mouth Rinse BID  . metoprolol tartrate  25 mg Oral BID  . pantoprazole  40 mg Oral BID  . potassium chloride  40 mEq Oral Daily  . pravastatin  40 mg Oral Daily  . sodium chloride flush  3 mL Intravenous Q12H    Continuous Infusions: . sodium chloride Stopped (12/08/17 0914)  . sodium chloride    . sodium chloride 20 mL/hr at 12/08/17 0000  . sodium chloride 10 mL (12/12/17 1253)  . dexmedetomidine (PRECEDEX) IV infusion Stopped (12/08/17 0515)  . lactated ringers    . lactated ringers Stopped (12/09/17 2148)    PRN Meds: sodium chloride, sodium chloride, acetaminophen, alum & mag hydroxide-simeth, dextromethorphan-guaiFENesin, diphenhydrAMINE, fluticasone, magnesium hydroxide, meclizine, metoprolol tartrate, midazolam, ondansetron (ZOFRAN) IV, oxyCODONE, sodium chloride flush, traMADol   Labs   Results for orders placed or performed during the hospital encounter of 12/01/17 (from the past 48 hour(s))  Glucose, capillary     Status: None   Collection Time: 12/11/17  4:50 PM  Result Value Ref Range   Glucose-Capillary 85 70 - 99 mg/dL   Comment 1 Notify RN    Comment  2 Document in Chart   Glucose, capillary     Status: Abnormal   Collection Time: 12/11/17  9:33 PM  Result Value Ref Range   Glucose-Capillary 120 (H) 70 - 99 mg/dL  Glucose, capillary     Status: Abnormal   Collection Time: 12/12/17  6:27 AM  Result Value Ref Range   Glucose-Capillary 111 (H) 70 - 99 mg/dL  Type and screen [place]     Status: None   Collection Time: 12/12/17 10:30 AM  Result Value Ref Range   ABO/RH(D) AB POS    Antibody Screen NEG    Sample Expiration 12/15/2017    Unit Number [id number]    Blood Component Type RED CELLS,LR    Unit division 00    Status of Unit ISSUED,FINAL    Transfusion Status OK TO TRANSFUSE    Crossmatch Result       Compatible Performed at [facility], [place] [address] [place], [place] [address]   Prepare RBC     Status: None   Collection Time: 12/12/17 10:47 AM  Result Value Ref Range   Order Confirmation      ORDER PROCESSED BY BLOOD BANK Performed at [facility], [place] [address] [place], [place] [address]   Glucose, capillary     Status: None   Collection Time: 12/12/17  9:20 PM  Result Value Ref Range   Glucose-Capillary 94 70 - 99 mg/dL  Basic metabolic panel     Status: Abnormal   Collection Time: 12/13/17  2:47 AM  Result Value Ref Range   Sodium 138 135 - 145 mmol/L   Potassium 3.3 (L) 3.5 - 5.1 mmol/L   Chloride 107 98 - 111 mmol/L    Comment: Please note change in reference range.   CO2 25 22 - 32 mmol/L   Glucose, Bld 94 70 - 99 mg/dL    Comment: Please note change in reference range.   BUN 13 8 - 23 mg/dL    Comment: Please note change in reference range.   Creatinine, Ser 1.33 (H) 0.61 - 1.24 mg/dL   Calcium 7.7 (L) 8.9 - 10.3 mg/dL   GFR calc non Af Amer 48 (L) >60 mL/min   GFR calc Af Amer 56 (L) >60 mL/min    Comment: (NOTE) The eGFR has been calculated using the CKD EPI equation. This calculation has not been validated in all clinical situations. eGFR's persistently <60 mL/min signify possible Chronic Kidney Disease.    Anion gap 6 5 - 15    Comment: Performed at [facility] [address], [place], [place] [address]  CBC     Status: Abnormal   Collection Time: 12/13/17  2:47 AM  Result Value Ref Range   WBC 6.5 4.0 - 10.5 K/uL   RBC 2.87 (L) 4.22 - 5.81 MIL/uL   Hemoglobin 8.8 (L) 13.0 - 17.0 g/dL   HCT 27.1 (L) 39.0 - 52.0 %   MCV 94.4 78.0 - 100.0 fL   MCH 30.7 26.0 - 34.0 pg   MCHC 32.5 30.0 - 36.0 g/dL   RDW 15.3 11.5 - 15.5 %   Platelets 143 (L) 150 - 400 K/uL    Comment: Performed at [facility], [place] [address], [place], [place] [address]    ECG   Prior ECG NSR with RBBB   - Personally Reviewed  Telemetry   NSR with  occasional PACs - Personally Reviewed  Radiology  No results found.  Cardiac Studies    Cath from 12/02/17 personally reviewed Nuclear stress test from 11/24/17 personally reviewed  Assessment   1. Principal Problem: 2.   Unstable angina ([place]) 3. Active Problems: 4.   Hypertension 5.   GAD (generalized anxiety disorder) 6.   HLD (hyperlipidemia) 7.   Coronary artery disease involving native coronary artery of native heart with unstable angina pectoris ([place]) 8.   S/P CABG x 3 9.   Plan   CAD s/p 3 V CABG. -Doing well postoperatively. Ambulating well. Still struggling with night terrors, feeling groggy after Ambien last night. Will try something else such as melatonin tonight. -given his longstanding thrombocytopenia, would minimize antiplatelet therapy. Changed to 81 mg aspirin daily. -continue metoprolol and statin, can consider increasing statin intensity as outpatient -ambulating -patient asking to follow up with me (Dr. Harrell Gave) or Dr. Harrington Challenger post discharge  Was concern for post op atrial fibrillation, but has been sinus with PACs. No indication for anticoagulation.  Counseled on post discharge follow up, exercise, lifestyle recommendations. He plans to gradually return to his active lifestyle.   CARDIOLOGY RECOMMENDATIONS:  Discharge is anticipated in the next 48 hours. Recommendations for medications and follow up:  Discharge Medications: Continue medications as they are currently listed in the [facility]. Exceptions to the above:  Changed aspirin to 81 mg given history of thrombocytopenia  Follow Up: Patient has requested to follow up with me (Dr. Harrell Gave) or Dr. Harrington Challenger depending on post-discharge availability.  Follow up in the office in 2 week(s).  Signed,  Buford Dresser, MD PhD 4:43 PM 12/13/2017  [facility]  Time Spent Directly with Patient: I have spent a total of >35 minutes with the patient reviewing hospital notes, telemetry, EKGs, labs  and examining the patient as well as establishing an assessment and plan that was discussed personally with the patient.  > 50% of time was spent in direct patient care.  Length of Stay:  LOS: 11 days   Buford Dresser, MD, PhD [facility]   12/13/2017, 4:37 PM

## 2017-12-13 NOTE — Progress Notes (Addendum)
[address]       [place] [address]             [phone]      6 Days Post-Op Procedure(s) (LRB): CORONARY ARTERY BYPASS GRAFTING (CABG) times three using left internal mammary artery and left endoscopically harvested saphenous vein graft (N/A) TRANSESOPHAGEAL ECHOCARDIOGRAM (TEE) (N/A) Subjective: Feels ok, not tolerating ambien  Objective: Vital signs in last 24 hours: Temp:  [97.5 F (36.4 C)-98.5 F (36.9 C)] 98.5 F (36.9 C) (07/02 0319) Pulse Rate:  [87-92] 92 (07/01 1630) Cardiac Rhythm: Normal sinus rhythm (07/02 0702) Resp:  [19-27] 23 (07/02 0319) BP: (123-142)/(63-70) 136/70 (07/02 0319) SpO2:  [97 %-99 %] 99 % (07/02 0319) Weight:  [89.6 kg (197 lb 8 oz)] 89.6 kg (197 lb 8 oz) (07/02 0319)  Hemodynamic parameters for last 24 hours:    Intake/Output from previous day: 07/01 0701 - 07/02 0700 In: 795 [P.O.:480; Blood:315] Out: 1350 [Urine:1350] Intake/Output this shift: No intake/output data recorded.  General appearance: alert, cooperative and no distress Heart: regular rate and rhythm Lungs: clear to auscultation bilaterally Abdomen: benign Extremities: min edema Wound: incis healing well  Lab Results: Recent Labs    12/11/17 0354 12/13/17 0247  WBC 8.4 6.5  HGB 7.4* 8.8*  HCT 22.9* 27.1*  PLT 102* 143*   BMET:  Recent Labs    12/11/17 0354 12/13/17 0247  NA 139 138  K 3.6 3.3*  CL 107 107  CO2 28 25  GLUCOSE 106* 94  BUN 22 13  CREATININE 1.38* 1.33*  CALCIUM 7.6* 7.7*    PT/INR: No results for input(s): LABPROT, INR in the last 72 hours. ABG    Component Value Date/Time   PHART 7.366 12/07/2017 2152   HCO3 18.6 (L) 12/07/2017 2152   TCO2 21 (L) 12/08/2017 1624   ACIDBASEDEF 6.0 (H) 12/07/2017 2152   O2SAT 98.0 12/07/2017 2152   CBG (last 3)  Recent Labs    12/11/17 2133 12/12/17 0627 12/12/17 2120  GLUCAP 120* 111* 94    Meds Scheduled Meds: . aspirin EC  325 mg Oral Daily   Or  .  aspirin  324 mg Per Tube Daily  . bisacodyl  10 mg Oral Daily   Or  . bisacodyl  10 mg Rectal Daily  . capsaicin   Topical BID  . cholestyramine  4 g Oral QAC lunch  . docusate sodium  200 mg Oral Daily  . loratadine  10 mg Oral Daily  . mouth rinse  15 mL Mouth Rinse BID  . metoprolol tartrate  25 mg Oral BID  . pantoprazole  40 mg Oral BID  . potassium chloride  40 mEq Oral Daily  . pravastatin  40 mg Oral Daily  . sodium chloride flush  3 mL Intravenous Q12H   Continuous Infusions: . sodium chloride Stopped (12/08/17 0914)  . sodium chloride    . sodium chloride 20 mL/hr at 12/08/17 0000  . sodium chloride 10 mL (12/12/17 1253)  . dexmedetomidine (PRECEDEX) IV infusion Stopped (12/08/17 0515)  . lactated ringers    . lactated ringers Stopped (12/09/17 2148)   PRN Meds:.sodium chloride, sodium chloride, alum & mag hydroxide-simeth, dextromethorphan-guaiFENesin, fluticasone, magnesium hydroxide, meclizine, metoprolol tartrate, midazolam, ondansetron (ZOFRAN) IV, oxyCODONE, sodium chloride flush, traMADol, zolpidem  Xrays No results found.  Assessment/Plan: S/P Procedure(s) (LRB): CORONARY ARTERY BYPASS GRAFTING (CABG) times three using left internal mammary artery and left endoscopically harvested saphenous vein graft (N/A) TRANSESOPHAGEAL  ECHOCARDIOGRAM (TEE) (N/A)   1 conts to do well 2 afib- appears to be maintaining sinus rhythm.  3 cont pulm toilet, C+DB, some upper airway congestion 4 creat conts to improve, replace K+ 5 H/H improved after transfusion, platelets improved   6 blood sugar controlled, A1C 5.3 preop- cont dietary management 7 No AC RX for now 8 if no changes, plan for home in am  LOS: 11 days    Ronald Russell 12/13/2017  Poss home in am I have seen and examined Ronald Fear Sr. and agree with the above assessment  and plan.  Ronald Isaac MD [facility] [phone] Office [phone] 12/13/2017 2:30 PM

## 2017-12-13 NOTE — Care Management Important Message (Signed)
Important Message  Patient Details  Name: Ronald Russell. MRN: [id number] Date of Birth: 04-19-1935   Medicare Important Message Given:  Yes    Dynver Clemson P Tomica Arseneault 12/13/2017, 3:07 PM

## 2017-12-14 ENCOUNTER — Other Ambulatory Visit: Payer: Self-pay

## 2017-12-14 ENCOUNTER — Ambulatory Visit: Payer: PPO | Admitting: Nurse Practitioner

## 2017-12-14 MED ORDER — CAPSAICIN 0.025 % EX CREA: TOPICAL_CREAM | Freq: Two times a day (BID) | CUTANEOUS | 0 refills | Status: DC

## 2017-12-14 MED ORDER — METOPROLOL TARTRATE 25 MG PO TABS
25.0000 mg | ORAL_TABLET | Freq: Two times a day (BID) | ORAL | 1 refills | Status: DC
Start: 2017-12-14 — End: 2017-12-23

## 2017-12-14 MED ORDER — OXYCODONE HCL 5 MG PO TABS: 5.0000 mg | ORAL_TABLET | Freq: Four times a day (QID) | ORAL | 0 refills | Status: AC | PRN

## 2017-12-14 MED ORDER — AMLODIPINE BESYLATE 5 MG PO TABS
5.0000 mg | ORAL_TABLET | Freq: Every day | ORAL | Status: DC
  Administered 2017-12-14: 5 mg via ORAL
  Filled 2017-12-14: qty 1

## 2017-12-14 MED ORDER — AMLODIPINE BESYLATE 5 MG PO TABS: 5.0000 mg | ORAL_TABLET | Freq: Every day | ORAL | 1 refills | Status: DC

## 2017-12-14 NOTE — Discharge Instructions (Signed)
Endoscopic Saphenous Vein Harvesting, Care After °Refer to this sheet in the next few weeks. These instructions provide you with information about caring for yourself after your procedure. Your health care provider may also give you more specific instructions. Your treatment has been planned according to current medical practices, but problems sometimes occur. Call your health care provider if you have any problems or questions after your procedure. °What can I expect after the procedure? °After the procedure, it is common to have: °· Pain. °· Bruising. °· Swelling. °· Numbness. ° °Follow these instructions at home: °Medicine °· Take over-the-counter and prescription medicines only as told by your health care provider. °· Do not drive or operate heavy machinery while taking prescription pain medicine. °Incision care ° °· Follow instructions from your health care provider about how to take care of the cut made during surgery (incision). Make sure you: °? Wash your hands with soap and water before you change your bandage (dressing). If soap and water are not available, use hand sanitizer. °? Change your dressing as told by your health care provider. °? Leave stitches (sutures), skin glue, or adhesive strips in place. These skin closures may need to be in place for 2 weeks or longer. If adhesive strip edges start to loosen and curl up, you may trim the loose edges. Do not remove adhesive strips completely unless your health care provider tells you to do that. °· Check your incision area every day for signs of infection. Check for: °? More redness, swelling, or pain. °? More fluid or blood. °? Warmth. °? Pus or a bad smell. °General instructions °· Raise (elevate) your legs above the level of your heart while you are sitting or lying down. °· Do any exercises your health care providers have given you. These may include deep breathing, coughing, and walking exercises. °· Do not shower, take baths, swim, or use a hot tub  unless told by your health care provider. °· Wear your elastic stocking if told by your health care provider. °· Keep all follow-up visits as told by your health care provider. This is important. °Contact a health care provider if: °· Medicine does not help your pain. °· Your pain gets worse. °· You have new leg bruises or your leg bruises get bigger. °· You have a fever. °· Your leg feels numb. °· You have more redness, swelling, or pain around your incision. °· You have more fluid or blood coming from your incision. °· Your incision feels warm to the touch. °· You have pus or a bad smell coming from your incision. °Get help right away if: °· Your pain is severe. °· You develop pain, tenderness, warmth, redness, or swelling in any part of your leg. °· You have chest pain. °· You have trouble breathing. °This information is not intended to replace advice given to you by your health care provider. Make sure you discuss any questions you have with your health care provider. °Document Released: 02/10/2011 Document Revised: 11/06/2015 Document Reviewed: 04/14/2015 °Elsevier Interactive Patient Education © 2018 Else[employer] Inc[employer] °Coronary Artery Bypass Grafting, Care After °These instructions give you information on caring for yourself after your procedure. Your doctor may also give you more specific instructions. Call your doctor if you have any problems or questions after your procedure. °Follow these instructions at home: °· Only take medicine as told by your doctor. Take medicines exactly as told. Do not stop taking medicines or start any new medicines without talking to your doctor first. °·   Take your pulse as told by your doctor.  Do deep breathing as told by your doctor. Use your breathing device (incentive spirometer), if given, to practice deep breathing several times a day. Support your chest with a pillow or your arms when you take deep breaths or cough.  Keep the area clean, dry, and protected where the  surgery cuts (incisions) were made. Remove bandages (dressings) only as told by your doctor. If strips were applied to surgical area, do not take them off. They fall off on their own.  Check the surgery area daily for puffiness (swelling), redness, or leaking fluid.  If surgery cuts were made in your legs: ? Avoid crossing your legs. ? Avoid sitting for long periods of time. Change positions every 30 minutes. ? Raise your legs when you are sitting. Place them on pillows.  Wear stockings that help keep blood clots from forming in your legs (compression stockings).  Only take sponge baths until your doctor says it is okay to take showers. Pat the surgery area dry. Do not rub the surgery area with a washcloth or towel. Do not bathe, swim, or use a hot tub until your doctor says it is okay.  Eat foods that are high in fiber. These include raw fruits and vegetables, whole grains, beans, and nuts. Choose lean meats. Avoid canned, processed, and fried foods.  Drink enough fluids to keep your pee (urine) clear or pale yellow.  Weigh yourself every day.  Rest and limit activity as told by your doctor. You may be told to: ? Stop any activity if you have chest pain, shortness of breath, changes in heartbeat, or dizziness. Get help right away if this happens. ? Move around often for short amounts of time or take short walks as told by your doctor. Gradually become more active. You may need help to strengthen your muscles and build endurance. ? Avoid lifting, pushing, or pulling anything heavier than 10 pounds (4.5 kg) for at least 6 weeks after surgery.  Do not drive until your doctor says it is okay.  Ask your doctor when you can go back to work.  Ask your doctor when you can begin sexual activity again.  Follow up with your doctor as told. Contact a doctor if:  You have puffiness, redness, more pain, or fluid draining from the incision site.  You have a fever.  You have puffiness in your  ankles or legs.  You have pain in your legs.  You gain 2 or more pounds (0.9 kg) a day.  You feel sick to your stomach (nauseous) or throw up (vomit).  You have watery poop (diarrhea). Get help right away if:  You have chest pain that goes to your jaw or arms.  You have shortness of breath.  You have a fast or irregular heartbeat.  You notice a "clicking" in your breastbone when you move.  You have numbness or weakness in your arms or legs.  You feel dizzy or light-headed. This information is not intended to replace advice given to you by your health care provider. Make sure you discuss any questions you have with your health care provider. Document Released: 06/05/2013 Document Revised: 11/06/2015 Document Reviewed: 11/07/2012 Elsevier Interactive Patient Education  2017 Elsevier Inc. Discharge Instructions:  1. You may shower, please wash incisions daily with soap and water and keep dry.  If you wish to cover wounds with dressing you may do so but please keep clean and change daily.  No tub  baths or swimming until incisions have completely healed.  If your incisions become red or develop any drainage please call our office at [phone]  2. No Driving until cleared by Dr. Leonarda Salon office and you are no longer using narcotic pain medications  3. Monitor your weight daily.. Please use the same scale and weigh at same time... If you gain 5-10 lbs in 48 hours with associated lower extremity swelling, please contact our office at [phone]  4. Fever of 101.5 for at least 24 hours with no source, please contact our office at [phone]  5. Activity- up as tolerated, please walk at least 3 times per day.  Avoid strenuous activity, no lifting, pushing, or pulling with your arms over 8-10 lbs for a minimum of 6 weeks  6. If any questions or concerns arise, please do not hesitate to contact our office at [phone]

## 2017-12-14 NOTE — Progress Notes (Signed)
Ed completed with pt and wife. Good reception. He would benefit from rollator due to significant back pain. Will refer to [facility] in [place].  [id number] [place], ACSM 9:52 AM 12/14/2017

## 2017-12-14 NOTE — Progress Notes (Signed)
[address]       [place] [address]             [phone]      7 Days Post-Op Procedure(s) (LRB): CORONARY ARTERY BYPASS GRAFTING (CABG) times three using left internal mammary artery and left endoscopically harvested saphenous vein graft (N/A) TRANSESOPHAGEAL ECHOCARDIOGRAM (TEE) (N/A) Subjective: Feels well, anxious to go home  Objective: Vital signs in last 24 hours: Temp:  [97.7 F (36.5 C)-98.3 F (36.8 C)] 98.3 F (36.8 C) (07/03 0443) Pulse Rate:  [88-96] 96 (07/02 2215) Cardiac Rhythm: Normal sinus rhythm;Bundle branch block (07/02 1900) Resp:  [18-26] 18 (07/03 0443) BP: (127-145)/(63-87) 145/87 (07/03 0443) SpO2:  [97 %-100 %] 97 % (07/03 0443) Weight:  [89.2 kg (196 lb 11.2 oz)] 89.2 kg (196 lb 11.2 oz) (07/03 0443)  Hemodynamic parameters for last 24 hours:    Intake/Output from previous day: 07/02 0701 - 07/03 0700 In: 400 [P.O.:400] Out: 450 [Urine:450] Intake/Output this shift: No intake/output data recorded.  General appearance: alert, cooperative and no distress Heart: regular rate and rhythm Lungs: clear to auscultation bilaterally Abdomen: benign Extremities: no edema Wound: incis healing well  Lab Results: Recent Labs    12/13/17 0247  WBC 6.5  HGB 8.8*  HCT 27.1*  PLT 143*   BMET:  Recent Labs    12/13/17 0247  NA 138  K 3.3*  CL 107  CO2 25  GLUCOSE 94  BUN 13  CREATININE 1.33*  CALCIUM 7.7*    PT/INR: No results for input(s): LABPROT, INR in the last 72 hours. ABG    Component Value Date/Time   PHART 7.366 12/07/2017 2152   HCO3 18.6 (L) 12/07/2017 2152   TCO2 21 (L) 12/08/2017 1624   ACIDBASEDEF 6.0 (H) 12/07/2017 2152   O2SAT 98.0 12/07/2017 2152   CBG (last 3)  Recent Labs    12/11/17 2133 12/12/17 0627 12/12/17 2120  GLUCAP 120* 111* 94    Meds Scheduled Meds: . aspirin EC  81 mg Oral Daily  . bisacodyl  10 mg Oral Daily   Or  . bisacodyl  10 mg Rectal Daily  . capsaicin    Topical BID  . cholestyramine  4 g Oral QAC lunch  . docusate sodium  200 mg Oral Daily  . loratadine  10 mg Oral Daily  . mouth rinse  15 mL Mouth Rinse BID  . metoprolol tartrate  25 mg Oral BID  . pantoprazole  40 mg Oral BID  . potassium chloride  40 mEq Oral Daily  . pravastatin  40 mg Oral Daily  . sodium chloride flush  3 mL Intravenous Q12H   Continuous Infusions: . sodium chloride Stopped (12/08/17 0914)  . sodium chloride    . sodium chloride 20 mL/hr at 12/08/17 0000  . sodium chloride 10 mL (12/12/17 1253)  . dexmedetomidine (PRECEDEX) IV infusion Stopped (12/08/17 0515)  . lactated ringers    . lactated ringers Stopped (12/09/17 2148)   PRN Meds:.sodium chloride, sodium chloride, acetaminophen, alum & mag hydroxide-simeth, dextromethorphan-guaiFENesin, diphenhydrAMINE, fluticasone, magnesium hydroxide, meclizine, metoprolol tartrate, midazolam, ondansetron (ZOFRAN) IV, oxyCODONE, sodium chloride flush, traMADol  Xrays No results found.  Assessment/Plan: S/P Procedure(s) (LRB): CORONARY ARTERY BYPASS GRAFTING (CABG) times three using left internal mammary artery and left endoscopically harvested saphenous vein graft (N/A) TRANSESOPHAGEAL ECHOCARDIOGRAM (TEE) (N/A)  1 doing well, stable for discharge 2 some elevated BP, will add low dose norvasc, with some elevation in creat will  hold on ACE-I/ARB 3 no new labs today, sugars ok 4 sinus rhythm with no further afib 5 no fevers, incis. healing well 6 stable for discharge today  LOS: 12 days    Ronald Russell 12/14/2017

## 2017-12-14 NOTE — Progress Notes (Signed)
Discussed with the patient and all questioned fully answered. He will call me if any problems arise. Discussed with pt and wife at bedside. Rollator delivered to room prior to DC. Pt given paper Rx.  Fritz Pickerel, RN

## 2017-12-14 NOTE — Progress Notes (Signed)
Telemetry and IV discontinued. CCMD notified. Sternal incision and right leg painted with betadine. CT sutures removed and steri strips applied. Patient tolerated well.   [name], RN

## 2017-12-14 NOTE — Care Management Note (Signed)
Case Management Note Marvetta Gibbons RN, BSN Unit 4E-Case Manager [id number]  Patient Details  Name: DYLAND PANUCO Sr. MRN: [id number] Date of Birth: [birth year]  Subjective/Objective:    Pt admitted [place],  Cath revealed severe 2VD- pt s/p CABGx3 on 6/26             Action/Plan: PTA pt lived at home with wife- plan to return home, per cardiac rehab pt would benefit from rollator for home- order placed. Notified James with [facility] for DME need, rollotor to be delivered to room prior to discharge  Expected Discharge Date:  12/14/17               Expected Discharge Plan:  Home/Self Care  In-House Referral:  NA  Discharge planning Services  CM Consult  Post Acute Care Choice:  Durable Medical Equipment Choice offered to:  Patient  DME Arranged:  Gilford Rile rolling with seat DME Agency:  [place]:    [facility] Agency:     Status of Service:  Completed, signed off  If discussed at [place] of Stay Meetings, dates discussed:    Discharge Disposition: home/self care   Additional Comments:  Dawayne Patricia, RN 12/14/2017, 10:17 AM

## 2017-12-16 ENCOUNTER — Other Ambulatory Visit: Payer: Self-pay

## 2017-12-16 ENCOUNTER — Telehealth: Payer: Self-pay | Admitting: Family Medicine

## 2017-12-16 ENCOUNTER — Telehealth: Payer: Self-pay | Admitting: Pediatrics

## 2017-12-16 NOTE — Telephone Encounter (Signed)
Yes should be fine to take xanax

## 2017-12-16 NOTE — Patient Outreach (Signed)
[facility]) Care Management  12/16/2017  Ronald DOMMER Sr. 10/26/1934 [id number]     EMMI-General Discharge RED ON EMMI ALERT Day # 1 Date: 12/15/17 Red Alert Reason: "Know who to call about changes in condition? No"   Outreach attempt # 1 to patient. Spoke with patient. He voices that he had been having a panic attack this morning. He is unsure what caused it and it started around 9am. Patient reports that he is slightly SOB as well. He denies any chest pain or other cardiac symptoms. Patient voices that last night he felt like this and he took a Lorazepam and it worked and relieved symptoms. However, patient states that this med was stopped by MD a while back but he still had medicine in the home and took one. Patient inquiring if he can take another one. Advised patient that he would need to get MD approval in order to resume a medication that has been stopped by MD. His spouse will call MD office to follow up on this. Patient confirmed that he knows to call his cardiologist for any cardiac related issues and PCP for all other issues. Patient advised to seek medical attention for any worsening and/or unresolved issues. Both patient and spouse verbalized understanding.        Plan: RN CM will close case at this time.    Enzo Montgomery, RN,BSN,CCM [place] Management Telephonic Care Management Coordinator Direct Phone: [phone] Toll Free: [phone] Fax: [phone]

## 2017-12-16 NOTE — Telephone Encounter (Signed)
Stacks pt 

## 2017-12-16 NOTE — Telephone Encounter (Signed)
Pt aware.

## 2017-12-16 NOTE — Telephone Encounter (Signed)
Dr  Evette Doffing will be in 12/19/17. Pt aware

## 2017-12-19 ENCOUNTER — Ambulatory Visit: Payer: PPO | Admitting: Physician Assistant

## 2017-12-22 NOTE — Telephone Encounter (Signed)
Ronald Russell states that he has had heart flutters staring  last Thurs/Friday while on commode lasting about 5 sec's. He also had them again this am lasting about 5 sec's while sitting in his recliner. He denied any SOB, N/V, dizziness, coughing, swelling. He is having good BM's  No problems with incision sites. He is currently taking metoprolol 25 mg BID and amlodipine 5mg  daily.  I told Ronald Russell I would make Dr Roxan Hockey aware. But to call his Cardiologist today and see if they could move up his appt from 12/30/17. If he had any problems to please call our office back.

## 2017-12-22 NOTE — Telephone Encounter (Signed)
Spoke with pt and advised that unfortunately we don't have an opening between now and 7/19. Pt informed that per Dr. Harrell Gave if symptoms increase or pt develops chest pain to report to ED for further evaluation. Pt verbalized understanding.

## 2017-12-23 ENCOUNTER — Telehealth: Payer: Self-pay | Admitting: Nurse Practitioner

## 2017-12-23 MED ORDER — METOPROLOL TARTRATE 50 MG PO TABS: 50.0000 mg | ORAL_TABLET | Freq: Two times a day (BID) | ORAL | 3 refills | Status: DC

## 2017-12-23 NOTE — Telephone Encounter (Signed)
Reviewed patient's concern with Dr. Harrington Challenger. She advised patient stop Amlodipine and increase Metoprolol to 50 mg BID. She advised that she can see him on 7/23 and for patient to call back prior to that appointment if symptoms do not improve.  I reviewed Dr. Harrington Challenger' advice with patient and he verbalized understanding and agreement. I verified his pharmacy and asked for any home BP/HR readings:  123/70, 79 123/79, 80 124/67, 80 122/73, 78 I advised patient to continue to monitor BP and HR and call our office before appointment with changes. I gave him parameters of BP < 140/80 mmHg and HR > 50 bpm. I advised we will get an event monitor on the same day as his appointment unless his symptoms have completely resolved. Patient was very thankful for my help and will call back with questions or concerns.

## 2017-12-23 NOTE — Telephone Encounter (Signed)
Spoke with patient who states he was discharged from [facility] on 6/27 s/p CABG. He states he called Dr. Leonarda Salon office today and was advised by Manuela Schwartz to call our office for an appointment. Patient states since d/c he has had 3 episodes of chest "fluttering", all occurring in the morning with episode this morning lasting the longest and most uncomfortable.  States he feels chest "fluttering" for approximately 5-10 sec, with episode today lasting for 30 sec  He denies SOB, weight gain, n/v or diaphoresis.  States appetite is gradually returning and bower movements are normal. He reports good hydration with light-colored urine.  States he feels groggy in the morning because he needs lorazepam to help him sleep. He states these episodes occur just prior to taking his morning dose of Metoprolol. He also states that he did not ask to switch doctors to Dr. Harrell Gave. He states he would like to stay with our office and see Truitt Merle, NP primarily with Dr. Harrington Challenger being his primary cardiologist. I advised that I will discuss these issues with Dr. Harrington Challenger or covering MD and call him back with their advice. He verbalized understanding and agreement and thanked me for the call.

## 2017-12-23 NOTE — Telephone Encounter (Signed)
New Message:      Manuela Schwartz is calling from the [place] in the hospital about the pt. She states the pt had a bypass surgery last week and is now experiencing some atrial flutter. She states the pt needs to be seen as soon as possible. She ask that we call the pt and email her at [email or web address]

## 2017-12-24 ENCOUNTER — Other Ambulatory Visit: Payer: Self-pay | Admitting: Family Medicine

## 2017-12-26 ENCOUNTER — Telehealth: Payer: Self-pay

## 2017-12-26 ENCOUNTER — Emergency Department (HOSPITAL_COMMUNITY)
Admission: EM | Admit: 2017-12-26 | Discharge: 2017-12-26 | Disposition: A | Discharge status: Home / Self Care | Payer: PPO | Attending: Emergency Medicine | Admitting: Emergency Medicine

## 2017-12-26 ENCOUNTER — Emergency Department (HOSPITAL_COMMUNITY): Payer: PPO

## 2017-12-26 ENCOUNTER — Encounter (HOSPITAL_COMMUNITY): Payer: Self-pay | Admitting: Emergency Medicine

## 2017-12-26 ENCOUNTER — Other Ambulatory Visit: Payer: Self-pay | Admitting: Cardiology

## 2017-12-26 DIAGNOSIS — I10 Essential (primary) hypertension: Secondary | ICD-10-CM | POA: Diagnosis not present

## 2017-12-26 DIAGNOSIS — I2510 Atherosclerotic heart disease of native coronary artery without angina pectoris: Secondary | ICD-10-CM | POA: Insufficient documentation

## 2017-12-26 DIAGNOSIS — I4891 Unspecified atrial fibrillation: Secondary | ICD-10-CM | POA: Diagnosis not present

## 2017-12-26 DIAGNOSIS — R002 Palpitations: Secondary | ICD-10-CM

## 2017-12-26 DIAGNOSIS — R079 Chest pain, unspecified: Secondary | ICD-10-CM | POA: Diagnosis not present

## 2017-12-26 DIAGNOSIS — I499 Cardiac arrhythmia, unspecified: Secondary | ICD-10-CM | POA: Diagnosis not present

## 2017-12-26 DIAGNOSIS — R531 Weakness: Secondary | ICD-10-CM | POA: Diagnosis not present

## 2017-12-26 DIAGNOSIS — R110 Nausea: Secondary | ICD-10-CM | POA: Diagnosis not present

## 2017-12-26 DIAGNOSIS — I493 Ventricular premature depolarization: Secondary | ICD-10-CM

## 2017-12-26 DIAGNOSIS — R0689 Other abnormalities of breathing: Secondary | ICD-10-CM | POA: Diagnosis not present

## 2017-12-26 DIAGNOSIS — J302 Other seasonal allergic rhinitis: Secondary | ICD-10-CM | POA: Insufficient documentation

## 2017-12-26 LAB — CBC WITH DIFFERENTIAL/PLATELET
Abs Immature Granulocytes: 0 10*3/uL (ref 0.0–0.1)
Basophils Absolute: 0.1 10*3/uL (ref 0.0–0.1)
Basophils Relative: 2 %
Eosinophils Absolute: 1 10*3/uL — ABNORMAL HIGH (ref 0.0–0.7)
Eosinophils Relative: 17 %
HCT: 34.7 % — ABNORMAL LOW (ref 39.0–52.0)
Hemoglobin: 10.9 g/dL — ABNORMAL LOW (ref 13.0–17.0)
Immature Granulocytes: 0 %
Lymphocytes Relative: 12 %
Lymphs Abs: 0.7 10*3/uL (ref 0.7–4.0)
MCH: 29.6 pg (ref 26.0–34.0)
MCHC: 31.4 g/dL (ref 30.0–36.0)
MCV: 94.3 fL (ref 78.0–100.0)
Monocytes Absolute: 0.6 10*3/uL (ref 0.1–1.0)
Monocytes Relative: 9 %
Neutro Abs: 3.5 10*3/uL (ref 1.7–7.7)
Neutrophils Relative %: 60 %
Platelets: 235 10*3/uL (ref 150–400)
RBC: 3.68 MIL/uL — ABNORMAL LOW (ref 4.22–5.81)
RDW: 14.3 % (ref 11.5–15.5)
WBC: 6 10*3/uL (ref 4.0–10.5)

## 2017-12-26 LAB — COMPREHENSIVE METABOLIC PANEL
ALT: 13 U/L (ref 0–44)
AST: 18 U/L (ref 15–41)
Albumin: 3.2 g/dL — ABNORMAL LOW (ref 3.5–5.0)
Alkaline Phosphatase: 74 U/L (ref 38–126)
Anion gap: 8 (ref 5–15)
BUN: 13 mg/dL (ref 8–23)
CO2: 22 mmol/L (ref 22–32)
Calcium: 8.4 mg/dL — ABNORMAL LOW (ref 8.9–10.3)
Chloride: 109 mmol/L (ref 98–111)
Creatinine, Ser: 1.39 mg/dL — ABNORMAL HIGH (ref 0.61–1.24)
GFR calc Af Amer: 53 mL/min — ABNORMAL LOW (ref >60)
GFR calc non Af Amer: 46 mL/min — ABNORMAL LOW (ref >60)
Glucose, Bld: 137 mg/dL — ABNORMAL HIGH (ref 70–99)
Potassium: 3.6 mmol/L (ref 3.5–5.1)
Sodium: 139 mmol/L (ref 135–145)
Total Bilirubin: 0.7 mg/dL (ref 0.3–1.2)
Total Protein: 6.2 g/dL — ABNORMAL LOW (ref 6.5–8.1)

## 2017-12-26 LAB — CBG MONITORING, ED: Glucose-Capillary: 133 mg/dL — ABNORMAL HIGH (ref 70–99)

## 2017-12-26 LAB — MAGNESIUM: Magnesium: 2 mg/dL (ref 1.7–2.4)

## 2017-12-26 LAB — I-STAT TROPONIN, ED: Troponin i, poc: 0.01 ng/mL (ref 0.00–0.08)

## 2017-12-26 MED ORDER — DILTIAZEM HCL 30 MG PO TABS
30.0000 mg | ORAL_TABLET | Freq: Once | ORAL | Status: AC
  Administered 2017-12-26: 30 mg via ORAL
  Filled 2017-12-26: qty 1

## 2017-12-26 MED ORDER — DILTIAZEM HCL 30 MG PO TABS: 30.0000 mg | ORAL_TABLET | Freq: Three times a day (TID) | ORAL | 0 refills | Status: DC

## 2017-12-26 MED ORDER — POTASSIUM GLUCONATE 550 MG PO TABS: 550.0000 mg | ORAL_TABLET | Freq: Every day | ORAL | 0 refills | Status: DC

## 2017-12-26 MED ORDER — METOPROLOL TARTRATE 25 MG PO TABS: 25.0000 mg | ORAL_TABLET | Freq: Two times a day (BID) | ORAL | 0 refills | Status: DC

## 2017-12-26 NOTE — Telephone Encounter (Signed)
Mr. Kisner wife, [name] contacted the office and left a voicemail about Mr. Whitesel heart beating really slow then fast.  She stated that "they" Cardiology changed his medications from 25 mg of Metoprolol to 50 mg.  She stated that it really scared him.  Upon called her back to get more information, she stated that she contacted 911 and they were there, going to take him to the hospital.  I acknowledged receipt.

## 2017-12-26 NOTE — Discharge Instructions (Addendum)
Call the office in the AM and ask when you can arrange the monitor to wear.  The order has been placed.  Heart healthy diet.  Keep follow up appt with Dr. Harrington Challenger as before.  01/03/18  So you need to decrease your metoprolol to 25 mg 2 times a day.  He will also start a new rhythm medicine called diltiazem which she will take 3 times a day.  Also you will increase your potassium to 1 pill one day and 2 pills the next day alternating.

## 2017-12-26 NOTE — ED Notes (Signed)
Patient can eat per Cards Provider. Sandwich given

## 2017-12-26 NOTE — ED Triage Notes (Signed)
Patient arrived from home via [facility]. He reports "my heart was skipping beats"  He reports that he  is 3 weeks post "BYPASS surgery".  Denies chest pains, A&O X4, he reports taking his metoprolol this morning.
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
## 2017-12-26 NOTE — ED Notes (Signed)
Cards at bedside
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
## 2017-12-26 NOTE — ED Provider Notes (Signed)
Patient is an 83 year old male with recent CABG and having PVCs.  Patient was seen by Dr. Harrington Challenger and at this time there is no evidence of atrial fibrillation or flutter.  Patient is asymptomatic except for sensing the ectopic beats but denies any dizziness or shortness of breath.  His magnesium level is within normal limits at 2.0.  Dr. Harrington Challenger recommended decreasing Lopressor to 25 mg twice daily and starting diltiazem 30 mg.  Also he will need to slightly increase his potassium gluconate to 1 tablet one day and to the next alternating.  They will plan on setting him up for event monitor.  Patient is well-appearing here and otherwise seems stable for discharge.   Blanchie Dessert, MD 12/26/17 1810

## 2017-12-26 NOTE — ED Notes (Signed)
D/c reviewed with patient and spouse

## 2017-12-29 ENCOUNTER — Telehealth: Payer: Self-pay | Admitting: *Deleted

## 2017-12-29 NOTE — Telephone Encounter (Signed)
Per message from Dr. Harrington Challenger: Pt needs event monitor for palpitations and then follow up clinic appointment.

## 2017-12-30 ENCOUNTER — Ambulatory Visit: Payer: PPO | Admitting: Cardiology

## 2018-01-02 ENCOUNTER — Ambulatory Visit: Payer: PPO | Admitting: Nurse Practitioner

## 2018-01-03 ENCOUNTER — Encounter: Payer: Self-pay | Admitting: Internal Medicine

## 2018-01-03 ENCOUNTER — Ambulatory Visit: Payer: PPO

## 2018-01-03 ENCOUNTER — Ambulatory Visit: Payer: PPO | Admitting: Internal Medicine

## 2018-01-03 VITALS — BP 102/48 | HR 74 | Ht 73.0 in | Wt 191.1 lb

## 2018-01-03 DIAGNOSIS — R002 Palpitations: Secondary | ICD-10-CM | POA: Diagnosis not present

## 2018-01-03 DIAGNOSIS — R609 Edema, unspecified: Secondary | ICD-10-CM

## 2018-01-03 DIAGNOSIS — I25110 Atherosclerotic heart disease of native coronary artery with unstable angina pectoris: Secondary | ICD-10-CM | POA: Diagnosis not present

## 2018-01-03 MED ORDER — DILTIAZEM HCL ER COATED BEADS 120 MG PO CP24: 120.0000 mg | ORAL_CAPSULE | Freq: Every day | ORAL | 3 refills | Status: DC

## 2018-01-03 NOTE — Patient Instructions (Addendum)
Your physician has recommended you make the following change in your medication:  1.) START DILTIAZEM CD 120 MG  Your physician recommends that you return for lab work in: [place] (bnp, bmet)  We will cancel your monitor appointment for today, since you have not noticed any symptoms for the past several days.
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
## 2018-01-04 LAB — PRO B NATRIURETIC PEPTIDE: NT-Pro BNP: 344 pg/mL (ref 0–486)

## 2018-01-04 LAB — BASIC METABOLIC PANEL
BUN/Creatinine Ratio: 9 — ABNORMAL LOW (ref 10–24)
BUN: 12 mg/dL (ref 8–27)
CO2: 23 mmol/L (ref 20–29)
Calcium: 9.3 mg/dL (ref 8.6–10.2)
Chloride: 101 mmol/L (ref 96–106)
Creatinine, Ser: 1.38 mg/dL — ABNORMAL HIGH (ref 0.76–1.27)
GFR calc Af Amer: 55 mL/min/{1.73_m2} — ABNORMAL LOW (ref >59)
GFR calc non Af Amer: 48 mL/min/{1.73_m2} — ABNORMAL LOW (ref >59)
Glucose: 88 mg/dL (ref 65–99)
Potassium: 4.2 mmol/L (ref 3.5–5.2)
Sodium: 141 mmol/L (ref 134–144)

## 2018-01-05 ENCOUNTER — Encounter: Payer: Self-pay | Admitting: Pediatrics

## 2018-01-05 ENCOUNTER — Ambulatory Visit (INDEPENDENT_AMBULATORY_CARE_PROVIDER_SITE_OTHER): Payer: PPO | Admitting: Pediatrics

## 2018-01-05 VITALS — BP 121/73 | HR 79 | Temp 97.0°F | Ht 73.0 in | Wt 191.2 lb

## 2018-01-05 DIAGNOSIS — R05 Cough: Secondary | ICD-10-CM

## 2018-01-05 DIAGNOSIS — R059 Cough, unspecified: Secondary | ICD-10-CM

## 2018-01-05 DIAGNOSIS — M79604 Pain in right leg: Secondary | ICD-10-CM | POA: Diagnosis not present

## 2018-01-05 DIAGNOSIS — J069 Acute upper respiratory infection, unspecified: Secondary | ICD-10-CM
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
## 2018-01-06 ENCOUNTER — Other Ambulatory Visit: Payer: Self-pay | Admitting: Thoracic Surgery (Cardiothoracic Vascular Surgery)

## 2018-01-06 DIAGNOSIS — Z951 Presence of aortocoronary bypass graft: Secondary | ICD-10-CM

## 2018-01-07 ENCOUNTER — Ambulatory Visit (INDEPENDENT_AMBULATORY_CARE_PROVIDER_SITE_OTHER): Payer: PPO | Admitting: Pediatrics

## 2018-01-07 VITALS — BP 133/70 | HR 87 | Temp 97.3°F | Ht 73.0 in | Wt 191.0 lb

## 2018-01-07 DIAGNOSIS — H65112 Acute and subacute allergic otitis media (mucoid) (sanguinous) (serous), left ear: Secondary | ICD-10-CM

## 2018-01-07 MED ORDER — AZITHROMYCIN 250 MG PO TABS: ORAL_TABLET | ORAL | 0 refills | Status: DC

## 2018-01-07 NOTE — Progress Notes (Signed)
  Subjective:   Patient ID: Ronald Morrow., male    DOB: 30-May-1935, 83 y.o.   MRN: [id number] CC: Otalgia (left ear. Seen last week and symptoms have worsened); Dizziness; Nasal Congestion; and Facial Pain (left maxillay pain and frontal above eyebrow. Has taken mucinex. )  HPI: Ronald YAPP Sr. is a 83 y.o. male   L ear bothering him, coughing more, productive of clear to green-tinged phelgm. Appetite down. No fevers. More runny nose and congestion. Seen two days ago for similar, at that time symptoms were improving though ongoing for over a week, thinks symptoms have gotten worse today.  R leg recent graft site tenderness and pain continues to improve. No swelling in b/l legs.  Relevant past medical, surgical, family and social history reviewed. Allergies and medications reviewed and updated. Social History   Tobacco Use  Smoking Status Former Smoker  . Packs/day: 0.75  . Years: 3.00  . Pack years: 2.25  . Types: Cigarettes  Smokeless Tobacco Never Used  Tobacco Comment   02/13/2013 "quit smoking age 33"   ROS: Per HPI   Objective:    BP 133/70 (BP Location: Right Arm, Patient Position: Sitting, Cuff Size: Normal)   Pulse 87   Temp (!) 97.3 F (36.3 C) (Oral)   Ht 6\' 1"  (1.854 m)   Wt 191 lb (86.6 kg)   BMI 25.20 kg/m   Wt Readings from Last 3 Encounters:  01/07/18 191 lb (86.6 kg)  01/05/18 191 lb 3.2 oz (86.7 kg)  01/03/18 191 lb 1.9 oz (86.7 kg)    Gen: NAD, alert, cooperative with exam, NCAT EYES: EOMI, no conjunctival injection, or no icterus ENT:  R TM not able to visualize due to cerumen obstruction. L TM yellow, dull with layering white effusion, OP without erythema LYMPH: no cervical LAD CV: NRRR, normal S1/S2, no murmur Resp: CTABL, no wheezes, normal WOB Abd: +BS, soft, NTND.   Ext: No edema, posterior R calf soft, no swelling Neuro: Alert and oriented, strength equal b/l UE and LE, coordination grossly normal Skin: R ant lower leg distal graft site  with two well healing scabs, ttp around the scabs. No surroudning induration. Linear redness from previous scar unchanged.  Assessment & Plan:  Ronald Russell was seen today for otalgia, dizziness, nasal congestion and facial pain.  Diagnoses and all orders for this visit:  Acute mucoid otitis media of left ear Will treat with below. Return precautions, symptom care discussed  -     azithromycin (ZITHROMAX) 250 MG tablet; Take 2 the first day and then one each day after.   Follow up plan: As needed Assunta Found, MD [place]

## 2018-01-09 ENCOUNTER — Ambulatory Visit: Payer: PPO | Admitting: Internal Medicine

## 2018-01-10 ENCOUNTER — Encounter: Payer: Self-pay | Admitting: Thoracic Surgery (Cardiothoracic Vascular Surgery)

## 2018-01-10 ENCOUNTER — Other Ambulatory Visit: Payer: Self-pay

## 2018-01-10 ENCOUNTER — Ambulatory Visit
Admission: RE | Admit: 2018-01-10 | Discharge: 2018-01-10 | Disposition: A | Discharge status: Home / Self Care | Payer: PPO | Source: Ambulatory Visit | Attending: Thoracic Surgery (Cardiothoracic Vascular Surgery) | Admitting: Thoracic Surgery (Cardiothoracic Vascular Surgery)

## 2018-01-10 ENCOUNTER — Ambulatory Visit (INDEPENDENT_AMBULATORY_CARE_PROVIDER_SITE_OTHER): Payer: Self-pay | Admitting: Thoracic Surgery (Cardiothoracic Vascular Surgery)

## 2018-01-10 VITALS — BP 111/75 | HR 79 | Resp 18 | Ht 73.0 in | Wt 191.0 lb

## 2018-01-10 DIAGNOSIS — J9811 Atelectasis: Secondary | ICD-10-CM | POA: Diagnosis not present

## 2018-01-10 DIAGNOSIS — Z951 Presence of aortocoronary bypass graft: Secondary | ICD-10-CM

## 2018-01-10 DIAGNOSIS — I2510 Atherosclerotic heart disease of native coronary artery without angina pectoris: Secondary | ICD-10-CM
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
## 2018-01-11 ENCOUNTER — Telehealth: Payer: Self-pay | Admitting: Internal Medicine

## 2018-01-11 NOTE — Telephone Encounter (Signed)
New message    Pt c/o medication issue:  1. Name of Medication: metoprolol tartrate (LOPRESSOR) 25 MG tablet  2. How are you currently taking this medication (dosage and times per day)? Take 1 tablet (25 mg total) by mouth 2 (two) times daily.  3. Are you having a reaction (difficulty breathing--STAT)? no  4. What is your medication issue? Patient states he is going out of town and has questions regarding medication. Declined to discuss reason with scheduler

## 2018-01-11 NOTE — Telephone Encounter (Signed)
I spoke with pt. He states he started twice dosing of metoprolol back this past Saturday.  Since then he has been having feeling like heart might skip when taking evening dose of metoprolol.  He denies feeling a skip but describes as an "empty feeling" in his chest.  He talked with Dr. Roxan Hockey yesterday about metoprolol and was advised to hold metoprolol if heart rate is less than 60.  Pt states heart rate has not been less than 60 and he is taking metoprolol twice daily. Pt is also going to [place], [place] soon and pt states this is OK with Dr. Roxan Hockey.  Pt is concerned about what to do if he has problems in [place].  I told him there was always an on call provider he could call if needed.  I told him to call EMS if severe problems. Pt wanted to let Dr Harrington Challenger know above information regarding metoprolol.  I told him I would send message to Dr. Harrington Challenger and we would call him back if she wanted to make any changes.

## 2018-01-12 NOTE — Telephone Encounter (Signed)
Agree with plans I saw him in July   He has appt in Sept with [facility] If he has more symptoms would set up event monitor Not necessary to see if he is feeling good but I can see him in [place]

## 2018-01-19 ENCOUNTER — Telehealth: Payer: Self-pay | Admitting: Internal Medicine

## 2018-01-19 NOTE — Telephone Encounter (Signed)
New Message        Patient called states he feels dizzy and just don't feel good, would like a call back pls.

## 2018-01-19 NOTE — Telephone Encounter (Signed)
At last ov pt was changed from short acting to long acting diltiazem.    He was instructed to cut back on metoprolol 25 mg to just the morning dose for few days during the change to long acting cardizem--and then go back to metoprolol 25 mg twice daily.  Since adding back the second dose of metoprolol patient reports feeling swimmy headed, dizzy and then becomes sick to his stomach.   Blood pressures have been 104-117/58-61.  HRs 65-70.  Took Cardizem CD and metoprolol today and no other meds yet due to nausea.    On short acting diltiazem 30 TID, pt did not feel this way.  However when it was time for the first morning dose he would feel heart fluttering.  On long acting he does not notice any fluttering at all.  Feels great other than the dizzy, sick to his stomach feeling.    Pt is aware I am forwarding to Dr. Harrington Challenger for recommendations on any medication adjustments and will call him back.

## 2018-01-19 NOTE — Telephone Encounter (Signed)
Left msg on VM Try cutting metoprolol in 1/2  2x per day Keep on long acting diltiazem

## 2018-01-20 MED ORDER — METOPROLOL TARTRATE 25 MG PO TABS: 12.5000 mg | ORAL_TABLET | Freq: Two times a day (BID) | ORAL | 3 refills | Status: DC

## 2018-01-20 NOTE — Telephone Encounter (Signed)
Informed patient to decrease metoprolol to 12.5 mg twice a day and stay on long acting diltiazem. He verbalizes understanding. All questions/concerns addressed. Reassured pt he can call office to speak to on call provider over weekend if further concerns about BP/swimmy headed feeling.

## 2018-01-24 DIAGNOSIS — J019 Acute sinusitis, unspecified: Secondary | ICD-10-CM | POA: Diagnosis not present

## 2018-01-31 ENCOUNTER — Telehealth: Payer: Self-pay | Admitting: Internal Medicine

## 2018-01-31 NOTE — Telephone Encounter (Signed)
New Message:   Pt c/o BP issue: STAT if pt c/o blurred vision, one-sided weakness or slurred speech  1. What are your last 5 BP readings? 112/71 98/66 103/66 100/63 - Taken Today    Pulse 65-75  2. Are you having any other symptoms (ex. Dizziness, headache, blurred vision, passed out)? Light Headed, Dizziness, Legs weakness  3. What is your BP issue? BP too low

## 2018-01-31 NOTE — Telephone Encounter (Signed)
Spoke to the patient in regards to his Bps and symptoms.  He has been feeling weak and lightheaded.  He has monitored his BP and has gotten the following readings:112/71, 103/66, 98/66, and 100/63 all are from today.  These symptoms and Bps have continued even since a medication change was done with his Metoprolol in early August.  He called then speaking of the same feeling.  Please advise, thank you.

## 2018-02-01 ENCOUNTER — Encounter: Payer: Self-pay | Admitting: Family Medicine

## 2018-02-01 ENCOUNTER — Ambulatory Visit (INDEPENDENT_AMBULATORY_CARE_PROVIDER_SITE_OTHER): Payer: PPO | Admitting: Family Medicine

## 2018-02-01 VITALS — BP 124/71 | HR 75 | Temp 97.2°F | Ht 73.0 in | Wt 196.2 lb

## 2018-02-01 DIAGNOSIS — H66005 Acute suppurative otitis media without spontaneous rupture of ear drum, recurrent, left ear: Secondary | ICD-10-CM | POA: Diagnosis not present

## 2018-02-01 DIAGNOSIS — J0101 Acute recurrent maxillary sinusitis: Secondary | ICD-10-CM

## 2018-02-01 MED ORDER — BETAMETHASONE SOD PHOS & ACET 6 (3-3) MG/ML IJ SUSP
6.0000 mg | Freq: Once | INTRAMUSCULAR | Status: AC
  Administered 2018-02-01: 6 mg via INTRAMUSCULAR

## 2018-02-01 MED ORDER — PSEUDOEPHEDRINE-GUAIFENESIN ER 60-600 MG PO TB12: 1.0000 | ORAL_TABLET | Freq: Two times a day (BID) | ORAL | 0 refills | Status: AC

## 2018-02-01 MED ORDER — LEVOFLOXACIN 500 MG PO TABS: 500.0000 mg | ORAL_TABLET | Freq: Every day | ORAL | 0 refills | Status: DC

## 2018-02-01 NOTE — Progress Notes (Signed)
Chief Complaint  Patient presents with  . Sinus Problem    pt here today c/o sinus problem and cough that won't go away    HPI  Patient presents today for pain in left ear. Sinus congestion that has not responded to zpack X 2 and mucinex. Still having drainage, Started back last night with productive cough. Denies fever, dyspnea.  PMH: Smoking status noted ROS: Per HPI  Objective: BP 124/71   Pulse 75   Temp (!) 97.2 F (36.2 C) (Oral)   Ht 6\' 1"  (1.854 m)   Wt 196 lb 4 oz (89 kg)   BMI 25.89 kg/m  Gen: NAD, alert, cooperative with exam HEENT: NCAT, EOMI, PERRL. Left TM dull, fluid noted. Red margin. Nasal passages swollen.  Left max sinus tender for percussion CV: RRR, good S1/S2, no murmur Resp: CTABL, no wheezes, non-labored Abd: SNTND, BS present, no guarding or organomegaly Ext: No edema, warm Neuro: Alert and oriented, No gross deficits  Assessment and plan:  1. Recurrent acute suppurative otitis media without spontaneous rupture of left tympanic membrane   2. Acute recurrent maxillary sinusitis     Meds ordered this encounter  Medications  . levofloxacin (LEVAQUIN) 500 MG tablet    Sig: Take 1 tablet (500 mg total) by mouth daily. For 10 days    Dispense:  10 tablet    Refill:  0  . pseudoephedrine-guaifenesin (MUCINEX D) 60-600 MG 12 hr tablet    Sig: Take 1 tablet by mouth every 12 (twelve) hours for 14 days. As needed for congestion    Dispense:  20 tablet    Refill:  0  . betamethasone acetate-betamethasone sodium phosphate (CELESTONE) injection 6 mg    No orders of the defined types were placed in this encounter.   Follow up as needed.  Claretta Fraise, MD

## 2018-02-02 ENCOUNTER — Telehealth: Payer: Self-pay | Admitting: Internal Medicine

## 2018-02-02 MED ORDER — DILTIAZEM HCL 30 MG PO TABS: 30.0000 mg | ORAL_TABLET | Freq: Two times a day (BID) | ORAL | 6 refills | Status: DC

## 2018-02-02 NOTE — Telephone Encounter (Signed)
Review of symptoms and BPS would recomm stopping diltiazem long acting   Go to short acting 30   Take 2x per day only   COntinue metoprolol    Follow BP and palpitations

## 2018-02-02 NOTE — Telephone Encounter (Signed)
See phone note from today 02/02/18.

## 2018-02-02 NOTE — Telephone Encounter (Signed)
New Message   Patient is calling to advise that his heart is skipping beats again. But he thinks its from so medication that he was giving for a sinus infection and fluid in his error. Please call to discuss.

## 2018-02-02 NOTE — Telephone Encounter (Signed)
Fay Records, MD at 02/02/2018 8:46 AM  Status: Signed   Review of symptoms and BPS would recomm stopping diltiazem long acting   Go to short acting 30   Take 2x per day only   COntinue metoprolol    Follow BP and palpitations     _______________________________________________________________   took ZPak  x2 for recurrent ear infection-no better Saw PCP-yesterday-received steroid shot, Levaquin, Mucinex.  Confirmed with patient it is mucinex only.  No decongestant.   At bedtime felt funny like heart going to flutter.  This am felt flutter about 4-5 times then quit. awake most of night due to steroid.   This am took whole metoprolol and diltiazem 120 mg. 140/83, HR 82.   Advised of Dr. Alan Ripper recommendations to stop Cardizem CD and go back to short acting diltiazem 30 mg BID. He is aware to start this tomorrow. Advised continue on 12.5 mg of lopressor BID, advised ok to take whole pill instead if feeling flutter sensation and as long as BP is >100.   Pt to call and let Dr. Harrington Challenger know if this occurs.   Pt asked if ok to take sildenafil 40 mg.  Advised that I will review with Dr. Harrington Challenger.  Has this medication at home. -has not taken because he knows it can lower his BP.

## 2018-02-06 ENCOUNTER — Encounter: Payer: Self-pay | Admitting: Family Medicine

## 2018-02-06 ENCOUNTER — Ambulatory Visit (INDEPENDENT_AMBULATORY_CARE_PROVIDER_SITE_OTHER): Payer: PPO | Admitting: Family Medicine

## 2018-02-06 VITALS — BP 115/73 | HR 82 | Temp 97.2°F | Ht 73.0 in | Wt 192.5 lb

## 2018-02-06 DIAGNOSIS — M7652 Patellar tendinitis, left knee: Secondary | ICD-10-CM | POA: Diagnosis not present

## 2018-02-06 DIAGNOSIS — R002 Palpitations: Secondary | ICD-10-CM | POA: Diagnosis not present

## 2018-02-06 DIAGNOSIS — F411 Generalized anxiety disorder: Secondary | ICD-10-CM

## 2018-02-06 MED ORDER — LORAZEPAM 0.5 MG PO TABS: 0.5000 mg | ORAL_TABLET | Freq: Every day | ORAL | 0 refills | Status: DC

## 2018-02-06 MED ORDER — NABUMETONE 500 MG PO TABS: 500.0000 mg | ORAL_TABLET | Freq: Two times a day (BID) | ORAL | 0 refills | Status: DC

## 2018-02-06 NOTE — Progress Notes (Signed)
Subjective:  Patient ID: Ronald Russell., male    DOB: 05/14/1935  Age: 83 y.o. MRN: [id number]  CC: Knee Pain (pt here today c/o left knee pain)   HPI Ronald WECKERLY Sr. presents for left knee pain started 2 days ago.  He is not sure what could have caused it.  It is located at the insertion of the patellar tendon at the superior aspect of the patella.  It is moderately painful but he cannot flex the leg without pain and it is interfering significantly with walking.  He was given a cortisone shot last week and he says that made his heart palpitate so he went to cardiology who adjusted his Cardizem and added metoprolol.  Patient occasionally takes lorazeppam for anxiety. Needs refill. Has severe anxiety reactions for which he uses the medicine  Depression screen Ronald Russell 2/9 01/07/2018 01/05/2018 12/01/2017  Decreased Interest 0 0 0  Down, Depressed, Hopeless 0 0 0  PHQ - 2 Score 0 0 0  Altered sleeping - - -  Tired, decreased energy - - -  Change in appetite - - -  Feeling bad or failure about yourself  - - -  Trouble concentrating - - -  Moving slowly or fidgety/restless - - -  Suicidal thoughts - - -  PHQ-9 Score - - -  Difficult doing work/chores - - -  Some recent data might be hidden    History [name] has a past medical history of Allergy, Anxiety, Bladder cancer ([place]) (2010), Cataract, Chronic bronchitis ([place]), Clotting disorder ([facility]), Exertional shortness of breath, GERD (gastroesophageal reflux disease), History of blood transfusion (1949), HOH (hard of hearing), Hypertension, Ocular migraine, Pneumonia, PONV (postoperative nausea and vomiting), Seasonal allergies, Shingles, and Temporal arteritis ([place]).   He has a past surgical history that includes Transurethral resection of bladder tumor (2010 X 3); mastoid tumor removed (Right, 1964); Tonsillectomy (1940's); Skin graft (Right, 1949); Colonoscopy; Vasectomy; Artery Biopsy (Left, 01/09/2013); Cardiovascular stress test (07/2011);  Cholecystectomy (N/A, 11/30/2016); LEFT HEART CATH AND CORONARY ANGIOGRAPHY (N/A, 12/02/2017); Coronary artery bypass graft (N/A, 12/07/2017); and TEE without cardioversion (N/A, 12/07/2017).   His family history includes Alzheimer's disease in his father; Cancer in his mother.He reports that he has quit smoking. His smoking use included cigarettes. He has a 2.25 pack-year smoking history. He has never used smokeless tobacco. He reports that he does not drink alcohol or use drugs.    ROS Review of Systems  Constitutional: Negative for fever.  Respiratory: Negative for shortness of breath.   Cardiovascular: Positive for palpitations. Negative for chest pain.  Musculoskeletal: Positive for arthralgias.  Skin: Negative for rash.    Objective:  BP 115/73   Pulse 82   Temp (!) 97.2 F (36.2 C) (Oral)   Ht 6\' 1"  (1.854 m)   Wt 192 lb 8 oz (87.3 kg)   BMI 25.40 kg/m   BP Readings from Last 3 Encounters:  02/06/18 115/73  02/01/18 124/71  01/10/18 111/75    Wt Readings from Last 3 Encounters:  02/06/18 192 lb 8 oz (87.3 kg)  02/01/18 196 lb 4 oz (89 kg)  01/10/18 191 lb (86.6 kg)     Physical Exam  Constitutional: He is oriented to person, place, and time. He appears well-developed and well-nourished.  HENT:  Head: Normocephalic and atraumatic.  Right Ear: External ear normal.  Left Ear: External ear normal.  Mouth/Throat: No oropharyngeal exudate or posterior oropharyngeal erythema.  Eyes: Pupils are equal, round, and reactive to  light.  Neck: Normal range of motion. Neck supple.  Cardiovascular: Normal rate and regular rhythm.  No murmur heard. Pulmonary/Chest: Breath sounds normal. No respiratory distress.  Musculoskeletal: He exhibits tenderness (left patellar tendon, with mild local edema at the insertion t the superior pole, patella).  Neurological: He is alert and oriented to person, place, and time.  Vitals reviewed.     Assessment & Plan:   Ronald Russell was seen  today for knee pain.  Diagnoses and all orders for this visit:  Patellar tendinitis of left knee  Palpitations  GAD (generalized anxiety disorder)  Other orders -     nabumetone (RELAFEN) 500 MG tablet; Take 1 tablet (500 mg total) by mouth 2 (two) times daily.       I have discontinued Ronald Russell. Ronald Sr.'s capsaicin. I am also having him start on nabumetone. Additionally, I am having him maintain his cholestyramine, pravastatin, aspirin EC, fluticasone, acetaminophen, CALCIUM-MAGNESIUM-VITAMIN D PO, omeprazole, Probiotic Product (PROBIOTIC PO), Salicylic Acid (SCALPICIN EX), Polyvinyl Alcohol-Povidone (REFRESH OP), fluocinonide cream, guaiFENesin, cetirizine, LORazepam, Potassium Gluconate, metoprolol tartrate, levofloxacin, pseudoephedrine-guaifenesin, and diltiazem.  Allergies as of 02/06/2018      Reactions   Uloric [febuxostat] Palpitations, Other (See Comments), Hypertension   Hypertension with palpitations and a sense of fuzziness and dizziness at the left side of his head   Augmentin [amoxicillin-pot Clavulanate] Diarrhea   Ciprofloxacin Diarrhea   Codeine Nausea And Vomiting, Other (See Comments)   Severe headache   Oxycodone Nausea And Vomiting   Tramadol Nausea And Vomiting   Vicodin [hydrocodone-acetaminophen] Nausea And Vomiting      Medication List        Accurate as of 02/06/18  5:35 PM. Always use your most recent med list.          acetaminophen 500 MG tablet Commonly known as:  TYLENOL Take 1,000 mg by mouth every 6 (six) hours as needed for headache (pain).   aspirin EC 81 MG tablet Take 1 tablet (81 mg total) by mouth daily.   CALCIUM-MAGNESIUM-VITAMIN D PO Take 2 tablets by mouth at bedtime.   cetirizine 10 MG tablet Commonly known as:  ZYRTEC Take 10 mg by mouth daily.   cholestyramine 4 g packet Commonly known as:  QUESTRAN Take 1 packet (4 g total) by mouth 4 (four) times daily.   diltiazem 30 MG tablet Commonly known as:   CARDIZEM Take 1 tablet (30 mg total) by mouth 2 (two) times daily.   fluocinonide cream 0.05 % Commonly known as:  LIDEX APPLY TOPICALLY TWICE DAILY   fluticasone 50 MCG/ACT nasal spray Commonly known as:  FLONASE Place 2 sprays into both nostrils at bedtime.   guaiFENesin 600 MG 12 hr tablet Commonly known as:  MUCINEX Take 600 mg by mouth 2 (two) times daily.   levofloxacin 500 MG tablet Commonly known as:  LEVAQUIN Take 1 tablet (500 mg total) by mouth daily. For 10 days   LORazepam 0.5 MG tablet Commonly known as:  ATIVAN Take 0.5 mg by mouth at bedtime.   metoprolol tartrate 25 MG tablet Commonly known as:  LOPRESSOR Take 0.5 tablets (12.5 mg total) by mouth 2 (two) times daily.   nabumetone 500 MG tablet Commonly known as:  RELAFEN Take 1 tablet (500 mg total) by mouth 2 (two) times daily.   omeprazole 40 MG capsule Commonly known as:  PRILOSEC Take 40 mg by mouth 2 (two) times daily.   Potassium Gluconate 550 MG Tabs Take 1 tablet (550 mg total)  by mouth at bedtime. Need to take 1 tab one day and 2 the next day alternating   pravastatin 40 MG tablet Commonly known as:  PRAVACHOL Take 1 tablet (40 mg total) by mouth daily.   PROBIOTIC PO Take 1 tablet by mouth daily.   pseudoephedrine-guaifenesin 60-600 MG 12 hr tablet Commonly known as:  MUCINEX D Take 1 tablet by mouth every 12 (twelve) hours for 14 days. As needed for congestion   REFRESH OP Place 1 drop into both eyes at bedtime as needed (dry eyes).   SCALPICIN EX Apply 1 application topically daily as needed (scalp itching).      KNee immobilizer dispensed. Wera for all ambulation for 2 weeks  Follow-up: Return in about 2 weeks (around 02/20/2018), or if symptoms worsen or fail to improve.  Claretta Fraise, M.D.

## 2018-02-07 NOTE — Telephone Encounter (Signed)
Follow up   Pt is calling stating that his heart is speeding up and he is still having flutters. Please call

## 2018-02-07 NOTE — Telephone Encounter (Signed)
That is OK   Call if symptoms worsen

## 2018-02-07 NOTE — Telephone Encounter (Signed)
Patient provided:  123/69, 77 125/68 75 124/68 77 121/77 80 122/73 78 Continues to feel great, the fluttering he feels occas continues to "scare" him.  Also wanted confirmation that HR is ok. Reassurance provided.   He does not feel he needs an appointment. Wants to know if everything is ok. Tolerating the diltiazem 30 mg two times a day Aware I will review with Dr. Harrington Challenger if he should increase medication or if he would benefit from holter. Noticing the skips are intermittent throughout the day.

## 2018-02-08 ENCOUNTER — Ambulatory Visit: Payer: PPO | Admitting: Physician Assistant

## 2018-02-08 NOTE — Telephone Encounter (Signed)
Informed patient to call if symptoms worsen.

## 2018-02-14 ENCOUNTER — Ambulatory Visit (INDEPENDENT_AMBULATORY_CARE_PROVIDER_SITE_OTHER): Payer: Self-pay | Admitting: Thoracic Surgery (Cardiothoracic Vascular Surgery)

## 2018-02-14 ENCOUNTER — Other Ambulatory Visit: Payer: Self-pay

## 2018-02-14 ENCOUNTER — Encounter: Payer: Self-pay | Admitting: Thoracic Surgery (Cardiothoracic Vascular Surgery)

## 2018-02-14 VITALS — BP 115/71 | HR 66 | Resp 16 | Ht 73.0 in | Wt 195.8 lb

## 2018-02-14 DIAGNOSIS — I2510 Atherosclerotic heart disease of native coronary artery without angina pectoris: Secondary | ICD-10-CM

## 2018-02-14 DIAGNOSIS — Z951 Presence of aortocoronary bypass graft: Secondary | ICD-10-CM

## 2018-02-14 NOTE — Progress Notes (Signed)
[address]       [place] [address]             [phone]    HPI: Ronald Russell returns for scheduled postoperative follow-up visit  Ronald Russell is an 83 year old man with a past history of hypertension, bladder cancer, anxiety, and recurrent bronchitis.  He was admitted back in June with unstable angina and underwent coronary bypass grafting x3 on 12/07/2017.  Initial postoperative course was uncomplicated.  He was seen back in the emergency room in July with his heart "fluttering."  He continued to have some palpitations but those resolved with adjustments to his medication by Dr. Harrington Challenger.  He says he has had problems with sinus congestion since his surgery.  He has had 3 rounds of antibiotics and around of steroids.  That seems to be improving now.  He is not having any incisional pain at rest.  He did try to do some power washing over the weekend and had a lot of soreness after that.  He has not had any recurrent angina  Past Medical History:  Diagnosis Date  . Allergy   . Anxiety   . Bladder cancer ([place]) 2010   bladder  . Cataract   . Chronic bronchitis ([place])    "yearly the last 3 yrs" (02/13/2013)  . Clotting disorder ([place])   . Exertional shortness of breath    "the last 6 months" (02/13/2013)  . GERD (gastroesophageal reflux disease)   . History of blood transfusion 1949   "seeral" (02/13/2013)  . HOH (hard of hearing)   . Hypertension   . Ocular migraine    "not often" (02/13/2013)  . Pneumonia    "last time ~ 2 yr ago; had it before that too" (02/13/2013)  . PONV (postoperative nausea and vomiting)   . Seasonal allergies   . Shingles    has neuropathy since it happened in 6/17  . Temporal arteritis [facility])   ]  Current Outpatient Medications  Medication Sig Dispense Refill  . acetaminophen (TYLENOL) 500 MG tablet Take 1,000 mg by mouth every 6 (six) hours as needed for headache (pain).    Marland Kitchen aspirin EC 81 MG tablet Take 1 tablet (81 mg total) by mouth daily.  90 tablet 3  . CALCIUM-MAGNESIUM-VITAMIN D PO Take 2 tablets by mouth at bedtime.     . cetirizine (ZYRTEC) 10 MG tablet Take 10 mg by mouth daily.    . cholestyramine (QUESTRAN) 4 g packet Take 1 packet (4 g total) by mouth 4 (four) times daily. (Patient taking differently: Take 4 g by mouth daily after lunch. ) 120 each 11  . diltiazem (CARDIZEM) 30 MG tablet Take 1 tablet (30 mg total) by mouth 2 (two) times daily. 60 tablet 6  . fluocinonide cream (LIDEX) 0.05 % APPLY TOPICALLY TWICE DAILY 30 g 0  . fluticasone (FLONASE) 50 MCG/ACT nasal spray Place 2 sprays into both nostrils at bedtime.    Marland Kitchen guaiFENesin (MUCINEX) 600 MG 12 hr tablet Take 600 mg by mouth 2 (two) times daily.    Marland Kitchen LORazepam (ATIVAN) 0.5 MG tablet Take 1 tablet (0.5 mg total) by mouth at bedtime. 30 tablet 0  . metoprolol tartrate (LOPRESSOR) 25 MG tablet Take 0.5 tablets (12.5 mg total) by mouth 2 (two) times daily. 90 tablet 3  . omeprazole (PRILOSEC) 40 MG capsule Take 40 mg by mouth 2 (two) times daily.    . Polyvinyl Alcohol-Povidone (REFRESH OP) Place 1 drop  into both eyes at bedtime as needed (dry eyes).    . pravastatin (PRAVACHOL) 40 MG tablet Take 1 tablet (40 mg total) by mouth daily. 90 tablet 1  . Probiotic Product (PROBIOTIC PO) Take 1 tablet by mouth daily.    . pseudoephedrine-guaifenesin (MUCINEX D) 60-600 MG 12 hr tablet Take 1 tablet by mouth every 12 (twelve) hours for 14 days. As needed for congestion 20 tablet 0  . Salicylic Acid (SCALPICIN EX) Apply 1 application topically daily as needed (scalp itching).    . nabumetone (RELAFEN) 500 MG tablet Take 1 tablet (500 mg total) by mouth 2 (two) times daily. (Patient not taking: Reported on 02/14/2018) 30 tablet 0  . Potassium Gluconate 550 MG TABS Take 1 tablet (550 mg total) by mouth at bedtime. Need to take 1 tab one day and 2 the next day alternating 30 tablet 0   No current facility-administered medications for this visit.     Physical Exam BP 115/71 (BP  Location: Right Arm, Patient Position: Sitting, Cuff Size: Large)   Pulse 66   Resp 16   Ht 6\' 1"  (1.854 m)   Wt 195 lb 12.8 oz (88.8 kg)   SpO2 96% Comment: ON RA  BMI 25.41 kg/m  83 year old man in no acute distress Alert and oriented x3 with no focal deficits Lungs clear with equal breath sounds bilaterally Cardiac regular rate and rhythm Sternum stable, sternal incision clean dry and intact Leg incisions healing well, no edema  Impression: Ronald Russell is an 83 year old gentleman who is now a full over 2 months out from coronary artery bypass grafting.  He has not had any recurrent angina.  He did have some postoperative atrial fibrillation.  He said some issues with palpitations.  That seems to be resolved with his current medical regimen.  Dr. Harrington Challenger is managing that.  He developed patellar tendinitis in his left knee.  Dr. Livia Snellen has him on Relafen for that.  That is improving as well.  There are no restrictions on him from a surgical standpoint.  Plan: Follow-up as scheduled with Dr. Livia Snellen and Dr. Harrington Challenger.  I will be happy to see Ronald Russell back again anytime in the future if I can be of any further assistance with his care.  Melrose Nakayama, MD [facility] [phone]

## 2018-02-17 ENCOUNTER — Ambulatory Visit (INDEPENDENT_AMBULATORY_CARE_PROVIDER_SITE_OTHER): Payer: PPO | Admitting: Family Medicine

## 2018-02-17 ENCOUNTER — Encounter: Payer: Self-pay | Admitting: Family Medicine

## 2018-02-17 ENCOUNTER — Telehealth: Payer: Self-pay | Admitting: Internal Medicine

## 2018-02-17 VITALS — BP 122/72 | HR 72 | Temp 97.3°F | Ht 73.0 in | Wt 196.0 lb

## 2018-02-17 DIAGNOSIS — J0100 Acute maxillary sinusitis, unspecified: Secondary | ICD-10-CM

## 2018-02-17 DIAGNOSIS — H9203 Otalgia, bilateral: Secondary | ICD-10-CM

## 2018-02-17 MED ORDER — LEVOFLOXACIN 500 MG PO TABS: 500.0000 mg | ORAL_TABLET | Freq: Every day | ORAL | 0 refills | Status: DC

## 2018-02-17 MED ORDER — SILDENAFIL CITRATE 100 MG PO TABS: 50.0000 mg | ORAL_TABLET | Freq: Every day | ORAL | 2 refills | Status: DC | PRN

## 2018-02-17 NOTE — Telephone Encounter (Signed)
New Message   Pt c/o medication issue:  1. Name of Medication:diltiazem (CARDIZEM) 30 MG tablet   2. How are you currently taking this medication (dosage and times per day)?   3. Are you having a reaction (difficulty breathing--STAT)?   4. What is your medication issue? Patient wants to know if he needs to increase this medication since his HR has settled down. He says he has a bad sinus infection and is concerned that the medication that they may give him for this it will effect his diltiazem. Please call

## 2018-02-17 NOTE — Telephone Encounter (Signed)
Ear is still bothering him. Almost passed out Sunday in restaurant. Became dizzy.  Said pulse was strong and heart felt ok.   Ear causes him to feel off balance.  Has had these problems for many years, since his 54s. Swimmy headed with position changes. Seeing PCP today for this. Patient thinks PCP may give steroid injection again and the last time he had increased episodes of heart fluttering. _____________________________  Wants to know if this occurs again can he take extra half tablet of metoprolol. He has done this in past and symptoms stopped after about an hour. Instructed ok to take extra 1/2 tablet of metoprolol as long as bp is >225 systolic. Advised if close to time for next dose to just take it early. Voices understanding and agreement with this plan.   Pt also asks again about whether it is safe to take Viagra.  He has a very old prescription and has not used.  Wants to know if Dr. Harrington Challenger will prescribe if ok to take.

## 2018-02-18 ENCOUNTER — Encounter: Payer: Self-pay | Admitting: Family Medicine

## 2018-02-18 NOTE — Progress Notes (Signed)
Subjective:  Patient ID: Ronald Russell., male    DOB: 1934/10/16  Age: 83 y.o. MRN: [id number]  CC: 10 day follow up   HPI Ronald CLOSSON Sr. presents for Symptoms include congestion, facial pain, nasal congestion, non productive cough, post nasal drip and sinus pressure. There is no fever, chills, or sweats. Onset of symptoms was a few days ago, gradually worsening since that time.    Depression screen [facility] 2/9 02/17/2018 01/07/2018 01/05/2018  Decreased Interest 0 0 0  Down, Depressed, Hopeless 0 0 0  PHQ - 2 Score 0 0 0  Altered sleeping - - -  Tired, decreased energy - - -  Change in appetite - - -  Feeling bad or failure about yourself  - - -  Trouble concentrating - - -  Moving slowly or fidgety/restless - - -  Suicidal thoughts - - -  PHQ-9 Score - - -  Difficult doing work/chores - - -  Some recent data might be hidden    History Ronald Russell has a past medical history of Allergy, Anxiety, Bladder cancer ([place]) (2010), Cataract, Chronic bronchitis ([place]), Clotting disorder ([place]), Exertional shortness of breath, GERD (gastroesophageal reflux disease), History of blood transfusion (1949), HOH (hard of hearing), Hypertension, Ocular migraine, Pneumonia, PONV (postoperative nausea and vomiting), Seasonal allergies, Shingles, and Temporal arteritis ([place]).   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   His family history includes Alzheimer's disease in his father; Cancer in his mother.He reports that he has quit smoking. His smoking use included cigarettes. He has a 2.25  pack-year smoking history. He has never used smokeless tobacco. He reports that he does not drink alcohol or use drugs.    ROS Review of Systems  Constitutional: Negative for activity change, appetite change, chills and fever.  HENT: Positive for congestion, postnasal drip, rhinorrhea and sinus pressure. Negative for ear discharge, ear pain, hearing loss, nosebleeds, sneezing and trouble swallowing.   Respiratory: Negative for chest tightness and shortness of breath.   Cardiovascular: Negative for chest pain and palpitations.  Skin: Negative for rash.    Objective:  BP 122/72   Pulse 72   Temp (!) 97.3 F (36.3 C) (Oral)   Ht 6\' 1"  (1.854 m)   Wt 196 lb (88.9 kg)   BMI 25.86 kg/m   BP Readings from Last 3 Encounters:  02/17/18 122/72  02/14/18 115/71  02/06/18 115/73    Wt Readings from Last 3 Encounters:  02/17/18 196 lb (88.9 kg)  02/14/18 195 lb 12.8 oz (88.8 kg)  02/06/18 192 lb 8 oz (87.3 kg)     Physical Exam  Constitutional: He appears well-developed and well-nourished.  HENT:  Head: Normocephalic and atraumatic.  Right Ear: Tympanic membrane and external ear normal. No decreased hearing is noted.  Left Ear: Tympanic membrane and external ear normal. No decreased hearing is noted.  Nose: Mucosal edema present. Right sinus exhibits no frontal sinus tenderness. Left sinus exhibits no frontal sinus tenderness.  Mouth/Throat: No oropharyngeal exudate or posterior oropharyngeal erythema.  Neck: No Brudzinski's sign noted.  Pulmonary/Chest: Breath sounds normal. No respiratory distress.  Lymphadenopathy:       Head (right side): No  preauricular adenopathy present.       Head (left side): No preauricular adenopathy present.       Right cervical: No superficial cervical adenopathy present.      Left cervical: No superficial cervical adenopathy present.      Assessment & Plan:   Ronald Russell was seen today for 10 day follow up.  Diagnoses and all orders for this  visit:  Acute maxillary sinusitis, recurrence not specified  Otalgia of both ears  Other orders -     levofloxacin (LEVAQUIN) 500 MG tablet; Take 1 tablet (500 mg total) by mouth daily. -     sildenafil (VIAGRA) 100 MG tablet; Take 0.5-1 tablets (50-100 mg total) by mouth daily as needed for erectile dysfunction.       I am having Ronald Maybee. Desmarais Sr. start on levofloxacin and sildenafil. I am also having him maintain his cholestyramine, pravastatin, aspirin EC, fluticasone, acetaminophen, CALCIUM-MAGNESIUM-VITAMIN D PO, omeprazole, Probiotic Product (PROBIOTIC PO), Salicylic Acid (SCALPICIN EX), Polyvinyl Alcohol-Povidone (REFRESH OP), fluocinonide cream, guaiFENesin, cetirizine, Potassium Gluconate, metoprolol tartrate, diltiazem, nabumetone, and LORazepam.  Allergies as of 02/17/2018      Reactions   Uloric [febuxostat] Palpitations, Other (See Comments), Hypertension   Hypertension with palpitations and a sense of fuzziness and dizziness at the left side of his head   Augmentin [amoxicillin-pot Clavulanate] Diarrhea   Ciprofloxacin Diarrhea   Codeine Nausea And Vomiting, Other (See Comments)   Severe headache   Oxycodone Nausea And Vomiting   Tramadol Nausea And Vomiting   Vicodin [hydrocodone-acetaminophen] Nausea And Vomiting      Medication List        Accurate as of 02/17/18 11:59 PM. Always use your most recent med list.          acetaminophen 500 MG tablet Commonly known as:  TYLENOL Take 1,000 mg by mouth every 6 (six) hours as needed for headache (pain).   aspirin EC 81 MG tablet Take 1 tablet (81 mg total) by mouth daily.   CALCIUM-MAGNESIUM-VITAMIN D PO Take 2 tablets by mouth at bedtime.   cetirizine 10 MG tablet Commonly known as:  ZYRTEC Take 10 mg by mouth daily.   cholestyramine 4 g packet Commonly known as:  QUESTRAN Take 1 packet (4 g total) by mouth 4 (four) times daily.   diltiazem 30 MG tablet Commonly known as:  CARDIZEM Take 1 tablet (30  mg total) by mouth 2 (two) times daily.   fluocinonide cream 0.05 % Commonly known as:  LIDEX APPLY TOPICALLY TWICE DAILY   fluticasone 50 MCG/ACT nasal spray Commonly known as:  FLONASE Place 2 sprays into both nostrils at bedtime.   guaiFENesin 600 MG 12 hr tablet Commonly known as:  MUCINEX Take 600 mg by mouth 2 (two) times daily.   levofloxacin 500 MG tablet Commonly known as:  LEVAQUIN Take 1 tablet (500 mg total) by mouth daily.   LORazepam 0.5 MG tablet Commonly known as:  ATIVAN Take 1 tablet (0.5 mg total) by mouth at bedtime.   metoprolol tartrate 25 MG tablet Commonly known as:  LOPRESSOR Take 0.5 tablets (12.5 mg total) by mouth 2 (two) times daily.   nabumetone 500 MG tablet Commonly known as:  RELAFEN Take 1 tablet (500 mg total) by mouth 2 (two) times daily.   omeprazole 40 MG capsule Commonly known as:  PRILOSEC Take 40 mg by mouth 2 (two) times daily.   Potassium Gluconate 550 MG Tabs Take 1 tablet (550 mg total) by mouth at bedtime.  Need to take 1 tab one day and 2 the next day alternating   pravastatin 40 MG tablet Commonly known as:  PRAVACHOL Take 1 tablet (40 mg total) by mouth daily.   PROBIOTIC PO Take 1 tablet by mouth daily.   REFRESH OP Place 1 drop into both eyes at bedtime as needed (dry eyes).   SCALPICIN EX Apply 1 application topically daily as needed (scalp itching).   sildenafil 100 MG tablet Commonly known as:  VIAGRA Take 0.5-1 tablets (50-100 mg total) by mouth daily as needed for erectile dysfunction.        Follow-up: Return in about 3 months (around 05/19/2018).  Claretta Fraise, M.D.

## 2018-02-19 NOTE — Telephone Encounter (Signed)
Please try to get note from PCP re visit I would not recomm viagra with his current symptoms

## 2018-02-20 ENCOUNTER — Telehealth: Payer: Self-pay | Admitting: Internal Medicine

## 2018-02-20 NOTE — Telephone Encounter (Signed)
Wll forward to pharmacy

## 2018-02-20 NOTE — Telephone Encounter (Signed)
Patient called in  Concerned about Levoquin and diltiazem/metoprolol and bradycardia Reviewed with M Supple Rare SE Dilt helped with palpitations when added Would cut back on metoprolol to 12.5 2x per day then in a few days 12.5 mg daily If HR still slow in 1 wk call.

## 2018-02-20 NOTE — Telephone Encounter (Signed)
Spoke with the patient who is concerned because he has been started on Levaquin for a sinus infection.  He is concerned because he takes his heart medication in the morning and then takes the Levaquin at 3 pm and then his evening heart medications after that.  It seems like he becomes very bradycardic about 2 hours after his evening heart meds.  He says that this has happened since the start of Levaquin and  that he is not taking his Levaquin until he gets advice.  He has an appt with his ENT on 9/10.  Please advise, thank you.

## 2018-02-20 NOTE — Telephone Encounter (Signed)
New Message:     Patient is requesting a call back

## 2018-02-20 NOTE — Telephone Encounter (Signed)
There are no side effects between patient's cards meds (metoprolol and diltiazem) and his levofloxacin. Levofloxacin is unlikely to cause bradycardia however package insert does report bradycardia < 1% so this may be a possibility. He is already taking very low doses of metoprolol and diltiazem and is taking an appropriate dose of levofloxacin given his renal function. May need to hold off on either metoprolol or diltiazem for duration of levofloxacin therapy however will defer to Dr Harrington Challenger for this.

## 2018-02-21 ENCOUNTER — Other Ambulatory Visit: Payer: Self-pay | Admitting: Family Medicine

## 2018-02-21 ENCOUNTER — Telehealth: Payer: Self-pay | Admitting: *Deleted

## 2018-02-21 DIAGNOSIS — H6123 Impacted cerumen, bilateral: Secondary | ICD-10-CM | POA: Diagnosis not present

## 2018-02-21 DIAGNOSIS — J329 Chronic sinusitis, unspecified: Secondary | ICD-10-CM | POA: Diagnosis not present

## 2018-02-21 MED ORDER — SILDENAFIL CITRATE 20 MG PO TABS: 20.0000 mg | ORAL_TABLET | Freq: Every day | ORAL | 5 refills | Status: DC | PRN

## 2018-02-21 NOTE — Telephone Encounter (Signed)
I sent in the requested prescription 

## 2018-02-21 NOTE — Telephone Encounter (Signed)
LMOVM that Rx was sent into pharmacy

## 2018-02-21 NOTE — Telephone Encounter (Signed)
Fax received [facility] Pt requests new Rx Can Sildenafil 20 mg be Rxd The 100 mg is too strong & too expensive Please advise

## 2018-02-22 NOTE — Telephone Encounter (Signed)
I called patient and after speaking with him scheduled for ov with APP. He continues to sense palpitations multiple times every day and night. 2 beats/miss, 2 beats/miss, etc.  This is intermittent but daily. He and his wife are frequently feeling his pulse and fear that his heart is "just going to stop." I did not instruct on any medication changes as noted below since today the patient states his HR has not been low. His primary concern is the skips he feels.  Today BP is 105/65, HR 68/70. has been ordered Viagra by PCP and I instructed to not take per Dr. Harrington Challenger due to current symptoms.

## 2018-02-22 NOTE — Telephone Encounter (Signed)
PCP is Dr. Livia Snellen. [place] OV note is 02/17/18 in [place]

## 2018-02-24 ENCOUNTER — Other Ambulatory Visit: Payer: Self-pay | Admitting: Otolaryngology

## 2018-02-24 ENCOUNTER — Telehealth: Payer: Self-pay | Admitting: Internal Medicine

## 2018-02-24 DIAGNOSIS — J329 Chronic sinusitis, unspecified: Secondary | ICD-10-CM

## 2018-02-24 NOTE — Telephone Encounter (Signed)
New message:      Patient c/o Palpitations:  High priority if patient c/o lightheadedness, shortness of breath, or chest pain  1) How long have you had palpitations/irregular HR/ Afib? Are you having the symptoms now? palpitations  2) Are you currently experiencing lightheadedness, SOB or CP? No  3) Do you have a history of afib (atrial fibrillation) or irregular heart rhythm? irrregular  4) Have you checked your BP or HR? (document readings if available):   5) Are you experiencing any other symptoms? Pt states he is having some palpitations started last night

## 2018-02-24 NOTE — Telephone Encounter (Signed)
New message   Patient c/o Palpitations:  High priority if patient c/o lightheadedness, shortness of breath, or chest pain  1) How long have you had palpitations/irregular HR/ Afib? Are you having the symptoms now? 1 year, yes  2) Are you currently experiencing lightheadedness, SOB or CP? No   3) Do you have a history of afib (atrial fibrillation) or irregular heart rhythm? yes  4) Have you checked your BP or HR? (document readings if available): yes , 108/66bp hr 58 bp 02/24/2018  125/78bp 68 hr  5) Are you experiencing any other symptoms? No

## 2018-02-27 MED ORDER — ATENOLOL 25 MG PO TABS: 12.5000 mg | ORAL_TABLET | Freq: Every day | ORAL | 3 refills | Status: DC

## 2018-02-27 NOTE — Telephone Encounter (Addendum)
Returned patient's phone calls. Saturday was having skipped beats. Took metoprolol whole tablet and "blood pressure dropped like a rock". Felt swimmy headed but did not have notice skipped beats. After that went back to 1/2 tablet twice a day along with diltiazem twice a day.  He intends to keep appointment with Ermalinda Barrios, PA-C Wednesday. He asks about taking atenolol instead-he has used this in the past and does not think he had bp issues on this.

## 2018-02-27 NOTE — Telephone Encounter (Signed)
Agree with above plan   Pt on atenolol in past   Helped  WOuld try 12.5 mg atenolol daily    Follow response

## 2018-02-27 NOTE — Telephone Encounter (Signed)
See other telephone encounter dated 02/24/18 for detail.

## 2018-02-27 NOTE — Telephone Encounter (Signed)
Fay Records, MD at 02/27/2018 1:54 PM   Status: Signed    Agree with above plan   Pt on atenolol in past   Helped  WOuld try 12.5 mg atenolol daily    Follow response
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
## 2018-03-01 ENCOUNTER — Encounter: Payer: Self-pay | Admitting: Physician Assistant

## 2018-03-01 ENCOUNTER — Ambulatory Visit: Payer: PPO | Admitting: Physician Assistant

## 2018-03-01 VITALS — BP 120/60 | HR 61 | Ht 73.0 in | Wt 196.4 lb

## 2018-03-01 DIAGNOSIS — R42 Dizziness and giddiness: Secondary | ICD-10-CM

## 2018-03-01 DIAGNOSIS — I25810 Atherosclerosis of coronary artery bypass graft(s) without angina pectoris: Secondary | ICD-10-CM

## 2018-03-01 DIAGNOSIS — E782 Mixed hyperlipidemia: Secondary | ICD-10-CM | POA: Diagnosis not present

## 2018-03-01 DIAGNOSIS — I10 Essential (primary) hypertension: Secondary | ICD-10-CM

## 2018-03-01 DIAGNOSIS — R002 Palpitations: Secondary | ICD-10-CM | POA: Diagnosis not present

## 2018-03-01 NOTE — Patient Instructions (Signed)
Medication Instructions:  Your physician recommends that you continue on your current medications as directed. Please refer to the Current Medication list given to you today.   Labwork: TODAY: BMET  Testing/Procedures: None ordered  Follow-Up: Your physician recommends that you schedule a follow-up appointment with Dr. Harrington Challenger next available    Any Other Special Instructions Will Be Listed Below (If Applicable).  Call us if you have palpitations and we will order a heart monitor   If you need a refill on your cardiac medications before your next appointment, please call your pharmacy.

## 2018-03-02 ENCOUNTER — Other Ambulatory Visit: Payer: PPO

## 2018-03-02 ENCOUNTER — Telehealth: Payer: Self-pay

## 2018-03-02 ENCOUNTER — Telehealth: Payer: Self-pay | Admitting: Medical

## 2018-03-02 DIAGNOSIS — R002 Palpitations: Secondary | ICD-10-CM

## 2018-03-02 DIAGNOSIS — R42 Dizziness and giddiness: Secondary | ICD-10-CM

## 2018-03-02 LAB — BASIC METABOLIC PANEL
BUN/Creatinine Ratio: 12 (ref 10–24)
BUN: 17 mg/dL (ref 8–27)
CO2: 23 mmol/L (ref 20–29)
Calcium: 9.1 mg/dL (ref 8.6–10.2)
Chloride: 103 mmol/L (ref 96–106)
Creatinine, Ser: 1.37 mg/dL — ABNORMAL HIGH (ref 0.76–1.27)
GFR calc Af Amer: 56 mL/min/{1.73_m2} — ABNORMAL LOW (ref >59)
GFR calc non Af Amer: 48 mL/min/{1.73_m2} — ABNORMAL LOW (ref >59)
Glucose: 96 mg/dL (ref 65–99)
Potassium: 4.1 mmol/L (ref 3.5–5.2)
Sodium: 142 mmol/L (ref 134–144)

## 2018-03-02 NOTE — Telephone Encounter (Signed)
Patient called again expressing concerns about his medications and reports continuing to feel palpitations and "swimmy headed" intermittently. He felt lightheaded when eating dinner tonight. He reports a BP in the 120s/60s and a HR of 50. He denied any chest pain, SOB, or syncope at that time. He was wondering if he should continue taking his diltiazem. Patient reports that he had been on atenolol alone for quite some time until his CABG when diltiazem was added to his regimen. Given bradycardia, I instructed patient to stop taking his diltiazem at this time. He was instructed to check his BP/HR in the morning and if his HR >80, he should be okay to take his atenolol 12.5mg  alone. He is scheduled for a holter monitor on 03/06/18 to further evaluate his palpitations. He was instructed to present to the ED if his symptoms worsening or if he has a syncopal episode. He was instructed to either activate EMS or have someone else take him, but he should not drive himself. He will contact the office if he has any further questions or concerns. He will follow-up with Richardson Dopp 03/17/18. Patient was in agreement with the plan.

## 2018-03-02 NOTE — Telephone Encounter (Signed)
Called and made patient aware of his lab results from his OV yesterday and recommendations to stay hydrated. Patient states that he has been staying hydrated and avoiding caffeine. Patient states that yesterday when he was mowing the lawn his heart was "skipping around". He states that it was not doing it before he started mowing and it stopped when he stopped. Patient denies any fast HRs. He denies any other Sx other than being "swimmy-headed". He states that he takes diltiazem 30 mg BID and atenolol 12.5 mg QD. Patient states that his BP this morning was 124/80 HR 68 before taking his diltiazem 30 mg tablet. After taking diltiazem his BP dropped to 104/66 HR 65. Patient states that he has not taken his atenolol. Patient states that he likes to be active and do things outside and this has been preventing him from doing so. Patient seems to not be tolerating diltiazem and BB combo.   Please advise on whether or not to continue BB and if we can order a monitor. Thanks.

## 2018-03-02 NOTE — Telephone Encounter (Signed)
-----   Message from Imogene Burn, PA-C sent at 03/02/2018  7:46 AM EDT ----- Kidney function about the same. Reiterate need to stay hydrated.

## 2018-03-02 NOTE — Telephone Encounter (Signed)
Placed order of holter monitor. Called patient and made him aware that someone will be calling him to schedule an appointment for the monitor. Patient verbalized understanding.

## 2018-03-02 NOTE — Telephone Encounter (Signed)
Discussed with Dr. Harrington Challenger, and patient can hold atenolol for now and we will arrange for next available 48-hr holter monitor.   Called patient and made him aware  Of Dr. Harrington Challenger' recommendations above. Patient scheduled for monitor to be placed on 9/23 at 9:30 AM. Instructed the patient to let us know if his Sx change or worsen.

## 2018-03-02 NOTE — Telephone Encounter (Signed)
Please set up for 48 hour monitor

## 2018-03-03 ENCOUNTER — Telehealth: Payer: Self-pay | Admitting: Internal Medicine

## 2018-03-03 ENCOUNTER — Ambulatory Visit: Payer: PPO | Admitting: Internal Medicine

## 2018-03-03 NOTE — Telephone Encounter (Signed)
Today at 1:30 pm heart started skipping a little bit. Took 12.5 mg atenolol Heart quit fluttering. 2 hrs later started again off/on since.  Took diltiazem and half atenolol yesterday am. This am 159/87, 77 (5am) after walking around the house for 15 mintues 6:10 am 135/70, 79 4pm 133/82, HR 67  Reassured patient. He is aware to call after hours number if needed or go to ER for worsening symptoms. Otherwise, will come for holter on Monday.

## 2018-03-03 NOTE — Telephone Encounter (Signed)
Pt c/o BP issue: STAT if pt c/o blurred vision, one-sided weakness or slurred speech  1. What are your last 5 BP readings? 6am this morning started taking BP  136/73 76hr, 148/78  74hr, 144/80  72hr, 141/77  71hr, 141/83  70  10:15am  2. Are you having any other symptoms (ex. Dizziness, headache, blurred vision, passed out)? HEAD IS CLOUDED, DIZZINESS   3. What is your BP issue? HIGH.  He called the after hr line this morning. Pt stated he has medication questions.

## 2018-03-03 NOTE — Telephone Encounter (Signed)
Thanks Brittany!

## 2018-03-06 ENCOUNTER — Ambulatory Visit (INDEPENDENT_AMBULATORY_CARE_PROVIDER_SITE_OTHER): Payer: PPO

## 2018-03-06 DIAGNOSIS — R42 Dizziness and giddiness: Secondary | ICD-10-CM

## 2018-03-06 DIAGNOSIS — R002 Palpitations: Secondary | ICD-10-CM | POA: Diagnosis not present

## 2018-03-08 ENCOUNTER — Telehealth: Payer: Self-pay | Admitting: Physician Assistant

## 2018-03-08 ENCOUNTER — Telehealth: Payer: Self-pay | Admitting: Internal Medicine

## 2018-03-08 ENCOUNTER — Telehealth: Payer: Self-pay | Admitting: *Deleted

## 2018-03-08 NOTE — Telephone Encounter (Signed)
New Message:     Patient stated he took the monitor off and his heart started to flutter. He would like to know how put the monitor back on

## 2018-03-08 NOTE — Telephone Encounter (Signed)
The patient called the answering service after-hours today. H/o prior post-op atrial fib, more recently NSR. Prior event monitor did show PACs correlating with skips. See multiple prior phone notes regarding palpitations. Lots of med adjustments, previously on metoprolol and LA dilt, now on atenolol and PRN dilt although pt states no longer taking PRN dilt as he thought someone told him not to. Just turned on Holter assessing rhythm. He had a good day after taking atenolol yesterday and today but this PM under increased stress with his wife has noticed increased palpitations mostly when he walks. No other symptoms. Walked long distances and hills yesterday without any issue. HR remains in the 60s, never tachycardic by his check. BP 120s-130s. Since atenolol has helped I advised he can try increasing to 12.5mg  BID as long as HR is >55 when he checks it. We discussed proceeding to ER to r/o more significant arrhythmia but he declines citing this is stable compared to prior palpitations. I will forward to Dr. Harrington Challenger for review of monitor and further recs. The patient verbalized understanding and gratitude. Shamila Lerch PA-C

## 2018-03-08 NOTE — Telephone Encounter (Signed)
Unfortunately, the 48 hour holter monitor has run its course.  He would not be able to reapply and/ or restart the monitor.   Please return monitor to our office today or tomorrow to have it scanned.  When you go over the results with your physician, let them know you had symptoms shortly after you removed your monitor.  They may want a long term holter monitor of up to 14 days or a 30 day cardiac event monitor.

## 2018-03-09 ENCOUNTER — Telehealth: Payer: Self-pay | Admitting: Physician Assistant

## 2018-03-09 NOTE — Telephone Encounter (Signed)
Pt called to report that he is still having palpitations. He denies any other symptoms such as dizziness, sob, weakness. He says he has taken his Atenolol this morning and he says they have improved some but still having them. He says they started yesterday when he "saw a beautiful woman and he is hung like a bull". He also said that he os concerned because he is having a scope of his bladder later today and he says they will "have to use a garden hose".. That they may not do the procedure that is not requiring sedation later today of his heart skipping. Advised him that I will forward to Dr. Harrington Challenger for her advice and will let him know her recommendations.

## 2018-03-09 NOTE — Telephone Encounter (Signed)
New message   Patient c/o Palpitations:  High priority if patient c/o lightheadedness, shortness of breath, or chest pain  1) How long have you had palpitations/irregular HR/ Afib? Are you having the symptoms now? 03/08/2018, yes  2) Are you currently experiencing lightheadedness, SOB or CP? lightheaded  3) Do you have a history of afib (atrial fibrillation) or irregular heart rhythm? No   4) Have you checked your BP or HR? (document readings if available): 141/79bp  61 hr  5) Are you experiencing any other symptoms? No

## 2018-03-13 ENCOUNTER — Other Ambulatory Visit: Payer: Self-pay

## 2018-03-13 NOTE — Patient Outreach (Signed)
[facility]) Care Management  03/13/2018  DIETRICK BARRIS Sr. [birth year] [id number]   Referral Date:03/13/18 Referral Source: Nurseline Referral Reason: Lightheaded and dizzy   Outreach Attempt: No answer.  HIPAA compliant voice message left.   Plan: RN CM will send letter and attempt patient again within 4 business days.     Jone Baseman, RN, MSN [facility] Care Management Care Management Coordinator Direct Line [phone] Toll Free: [phone]  Fax: [phone]

## 2018-03-15 ENCOUNTER — Ambulatory Visit: Payer: Self-pay

## 2018-03-15 ENCOUNTER — Other Ambulatory Visit: Payer: Self-pay

## 2018-03-15 NOTE — Patient Outreach (Signed)
[facility]) Care Management  03/15/2018  NEERAJ HOUSAND Sr. [birth year] [id number]    Referral Date:03/13/18 Referral Source: Nurseline Referral Reason: Lightheaded and dizzy  Telephone call to patient for follow up nurse line call.  Patient able to verify HIPAA.  Discussed reason for call. He states that he is doing a lot better and that his blood pressure today was 134/71.  Patient shares that he had a CABG in June and that the doctors have been working on his blood pressure medications.  He states he has an appointment on Friday with his physician.  Encouraged patient to discussed blood pressure and medications with physician.  He verbalized understanding and agrees.    Discussed with patient [facility] services and how we can support him in his blood pressure.  Patient is agreeable to health coach for disease management and support of his hypertension.  Patient states that sometimes he spends time at his home in [place], [place] and states that the nurse can call him on his cell if they do not get him at his home number.  Confirmed with patient his cell phone number.    Plan: RN CM will refer patient to health coach for disease management and support of his blood pressure.    Jone Baseman, RN, MSN [facility] [facility] Care Management Coordinator Direct Line [phone] Cell [phone] Toll Free: [phone]  Fax: [phone]

## 2018-03-16 ENCOUNTER — Ambulatory Visit: Payer: PPO

## 2018-03-16 NOTE — Telephone Encounter (Signed)
BP may  Limit med titration. Could try metoprolol 1x per day   Increase Dilt to 180 mg per day

## 2018-03-17 ENCOUNTER — Ambulatory Visit: Payer: PPO | Admitting: Physician Assistant

## 2018-03-17 ENCOUNTER — Encounter: Payer: Self-pay | Admitting: Physician Assistant

## 2018-03-17 VITALS — Ht 72.0 in | Wt 198.8 lb

## 2018-03-17 DIAGNOSIS — I10 Essential (primary) hypertension: Secondary | ICD-10-CM

## 2018-03-17 DIAGNOSIS — I2510 Atherosclerotic heart disease of native coronary artery without angina pectoris: Secondary | ICD-10-CM

## 2018-03-17 DIAGNOSIS — R002 Palpitations: Secondary | ICD-10-CM | POA: Diagnosis not present

## 2018-03-17 DIAGNOSIS — E785 Hyperlipidemia, unspecified: Secondary | ICD-10-CM | POA: Diagnosis not present

## 2018-03-17 MED ORDER — ATENOLOL 25 MG PO TABS: 12.5000 mg | ORAL_TABLET | Freq: Every day | ORAL | 3 refills | Status: DC

## 2018-03-17 MED ORDER — ATENOLOL 25 MG PO TABS: 12.5000 mg | ORAL_TABLET | ORAL | Status: DC | PRN
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
## 2018-03-17 NOTE — Patient Instructions (Addendum)
Medication Instructions:   TAKE ATENOLOL 12.5 TO 25 MG ONCE A DAY  IF BLOOD PRESSURE IF RUNNING HIGHER THAN 160/95   refill on your cardiac medications before your next appointment, please call your pharmacy.  Labwork: NONE ORDERED  TODAY   Testing/Procedures: NONE ORDERED  TODAY    Follow-Up: WITH  DR ROSS IN 3 MONTHS    Any Other Special Instructions Will Be Listed Below (If Applicable).   MAKE SURE YOU CHECK  BLOOD PRESSURE  2 TO 3 TIMES A  DAY  FOR 2 WEEKS  AND BRING A COPY TO DROP OFF FOR  WEAVER  TO REVIEW   MAKE SURE YOU WEAR  COMPRESSION  STOCKINGS ( KNEE HIGH /OTC)                                                                                                                                            '

## 2018-03-17 NOTE — Addendum Note (Signed)
Addended by: Claude Manges on: 03/17/2018 12:30 PM   Modules accepted: Orders

## 2018-03-17 NOTE — Telephone Encounter (Signed)
Patient seen this am by Kathleen Argue APP. See ov notes.

## 2018-03-21 ENCOUNTER — Other Ambulatory Visit: Payer: Self-pay | Admitting: Family Medicine

## 2018-03-21 ENCOUNTER — Telehealth: Payer: Self-pay

## 2018-03-21 ENCOUNTER — Telehealth: Payer: Self-pay | Admitting: Nurse Practitioner

## 2018-03-21 MED ORDER — SILDENAFIL CITRATE 20 MG PO TABS: 20.0000 mg | ORAL_TABLET | Freq: Every day | ORAL | 5 refills | Status: DC | PRN

## 2018-03-21 MED ORDER — SILDENAFIL CITRATE 100 MG PO TABS: 50.0000 mg | ORAL_TABLET | Freq: Every day | ORAL | 11 refills | Status: DC | PRN

## 2018-03-21 NOTE — Telephone Encounter (Signed)
Getting prior auth for Sildenafil  This is not on patients list of meds  Do I need to do?

## 2018-03-21 NOTE — Telephone Encounter (Signed)
Reviewed monitor results and plan of care with patient. Patient c/o feeling "swimmy-headed" most of the time and is currently not taking atenolol. I advised him to follow-up with PCP for this concern. He states he has seen PCP and ENT for this concern and has followed advice given. He states he is currently avoiding all medications to determine if the problem was coming from taking lorazepam. He states he does feel the skipped beats at times and they are bothersome at times. I advised him to stay well hydrated and that he could try an electrolyte drink for palpitations. I advised that he should monitor HR and BP and if these are within normal limits that he could take Atenolol 12.5 to 25 mg daily. He states he will try taking 12.5 twice daily as needed. I advised him to keep January appointment with Dr. Harrington Challenger and call back sooner with questions or concerns. He thanked me for the call.

## 2018-03-21 NOTE — Telephone Encounter (Signed)
Please go ahead with prior auth. Cardiology PA [facility] the med without giving a reason in the chart on Sept. 18. I put it back in. Thanks, WS

## 2018-03-21 NOTE — Telephone Encounter (Signed)
-----   Message from Fay Records, MD sent at 03/20/2018  9:08 PM EDT ----- HR controlled.  Ocassional skip noted   Not always sensed. No significant arrhythmia Could try takine 1/2 atenolol daily and see how feels.

## 2018-03-22 ENCOUNTER — Telehealth: Payer: Self-pay | Admitting: Physician Assistant

## 2018-03-22 NOTE — Telephone Encounter (Signed)
I called the pt and advised per Ronald Merle, NP to get flu shot, just make sure to get the higher dose flu shot. Pt thanked me for the call.

## 2018-03-22 NOTE — Telephone Encounter (Signed)
I will forward to Truitt Merle, NP for further advice. Truitt Merle, NP is covering [employer] while out if the office.

## 2018-03-22 NOTE — Telephone Encounter (Signed)
Flu shot will be fine. High dose recommended due to his age.   Burtis Junes, RN, [facility] [address] [place], [place]  [phone]

## 2018-03-22 NOTE — Telephone Encounter (Signed)
  Had heart bipass on 12/07/17 and wants to know if it is okay for him to get the flu shot

## 2018-03-24 ENCOUNTER — Other Ambulatory Visit: Payer: Self-pay | Admitting: Family Medicine

## 2018-04-10 IMAGING — CT CT ABD-PELV W/O CM
2 of 4 series · 16 of 46 positions shown, 18 images · non-contrast
Comparison: 12/29/2011

CLINICAL DATA: Nausea for 1 week

EXAM:
CT ABDOMEN AND PELVIS WITHOUT CONTRAST
TECHNIQUE: Multidetector CT imaging of the abdomen and pelvis was performed
following the standard protocol without IV contrast.

[Series 2: a/p w/o 5mm · axial · non-contrast · 0.81mm/px · z∈[+769,+1244]mm · 13 of 105 slices shown, 15 images]
[im 5/105  soft-tissue]
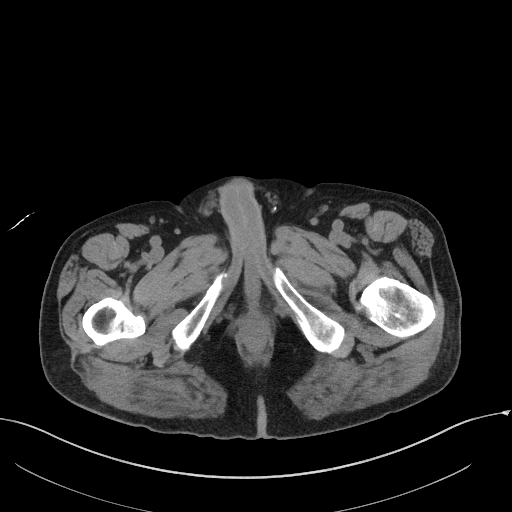
[im 5/105  bone]
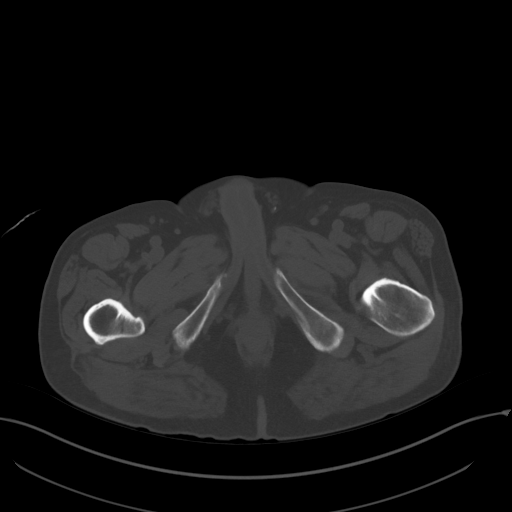
[im 15/105  soft-tissue]
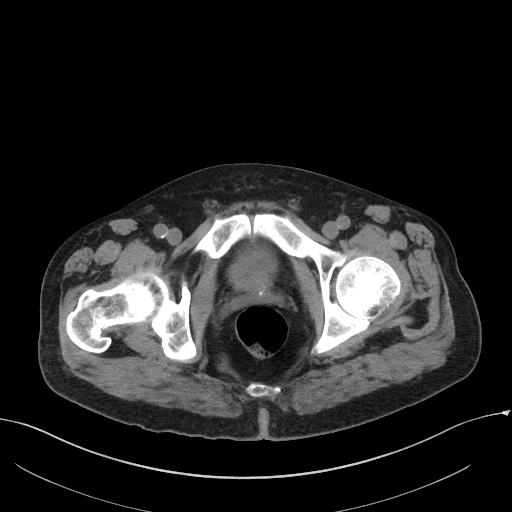
[im 24/105  soft-tissue]
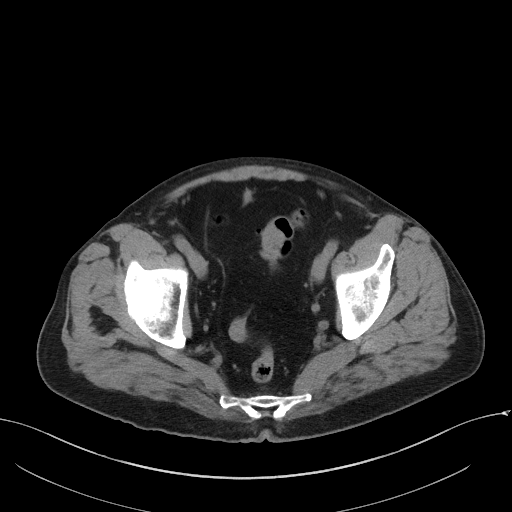
[im 29/105  soft-tissue]
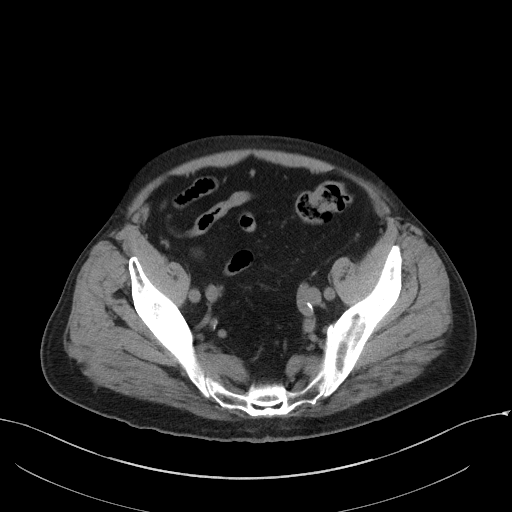
[im 38/105  soft-tissue]
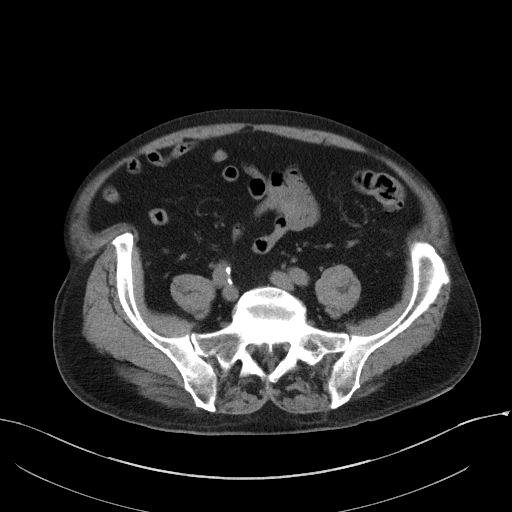
[im 43/105  soft-tissue]
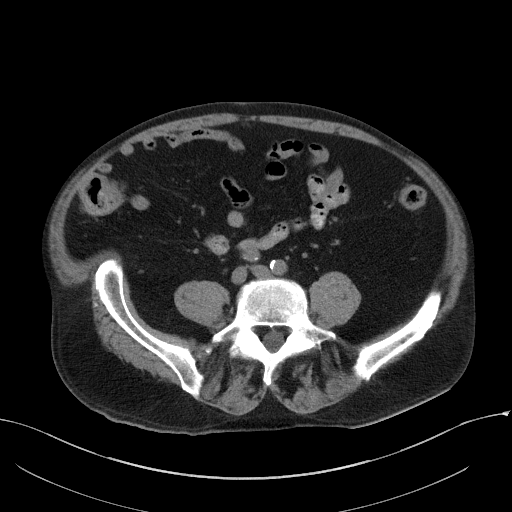
[im 53/105  soft-tissue]
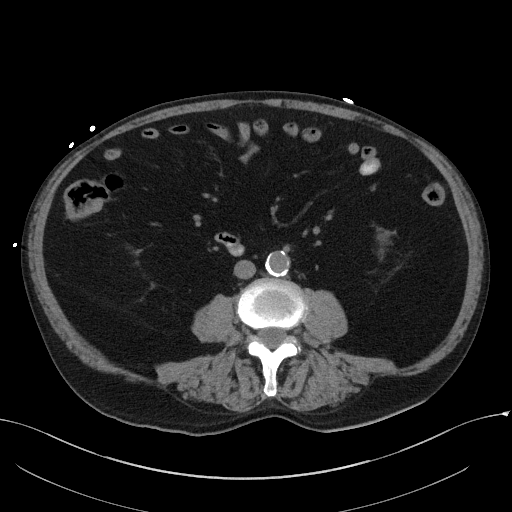
[im 62/105  soft-tissue]
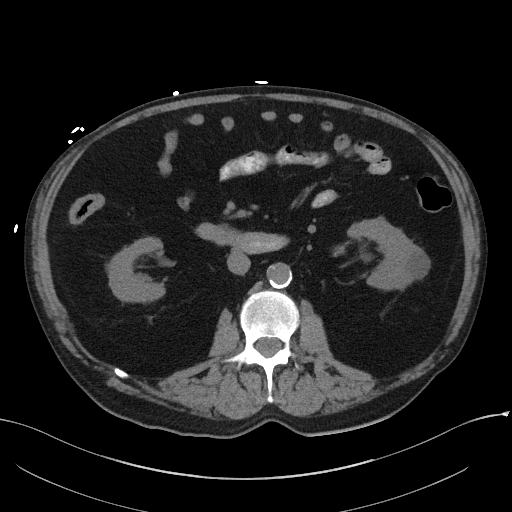
[im 67/105  soft-tissue]
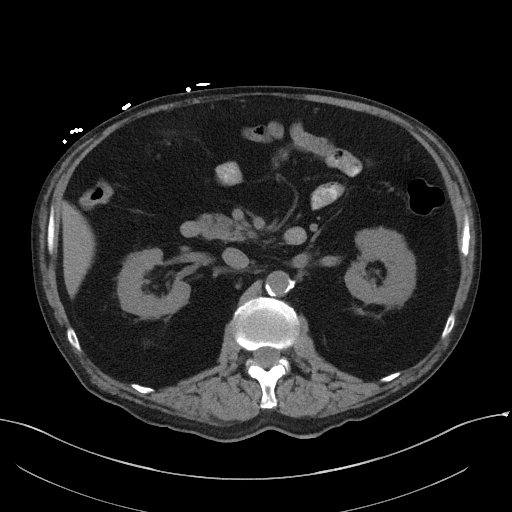
[im 67/105  bone]
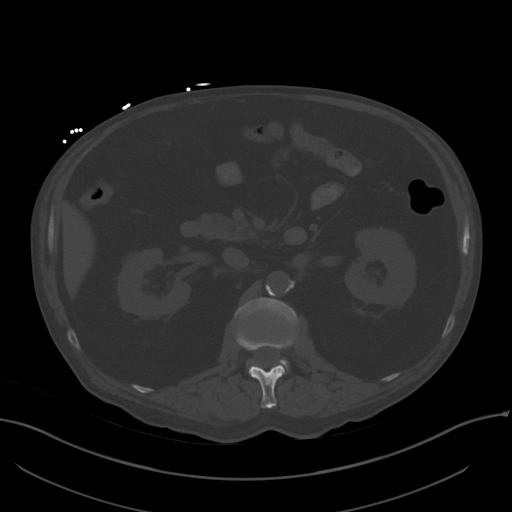
[im 76/105  soft-tissue]
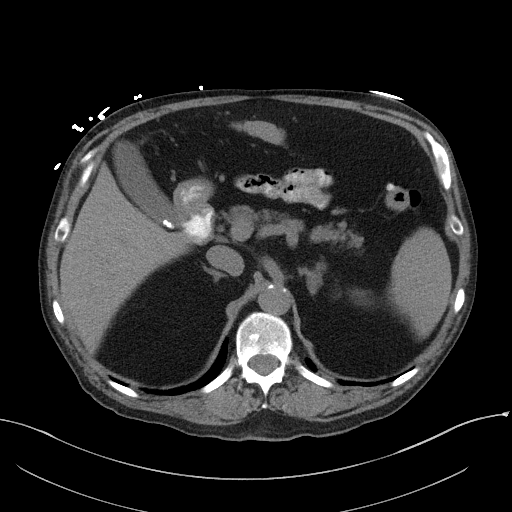
[im 81/105  soft-tissue]
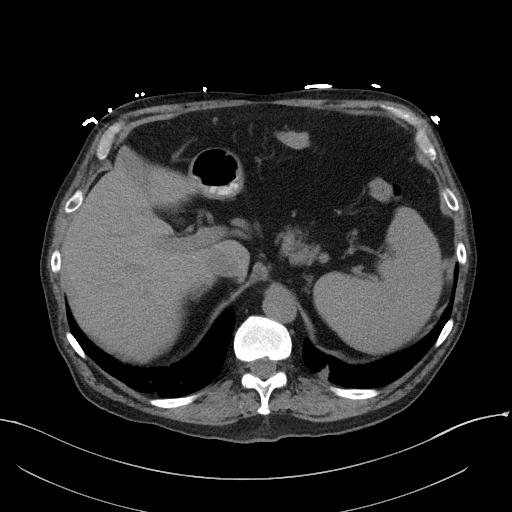
[im 90/105  soft-tissue]
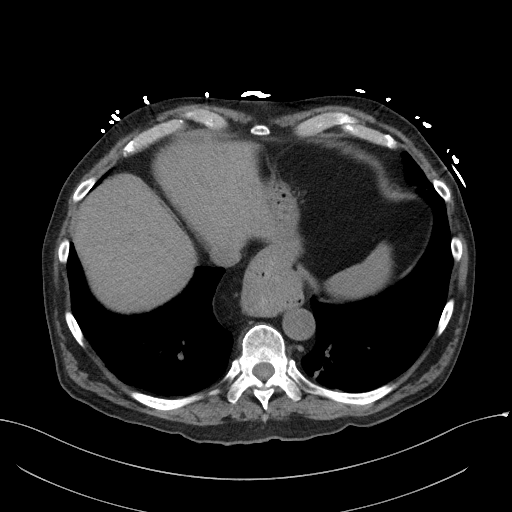
[im 100/105  soft-tissue]
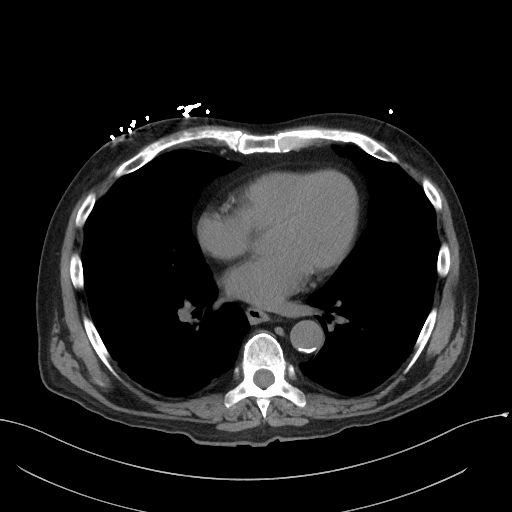

[Series 5: a/p w/o cor · coronal · non-contrast · 0.83mm/px · 3 of 151 slices shown]
[im 51/151  soft-tissue]
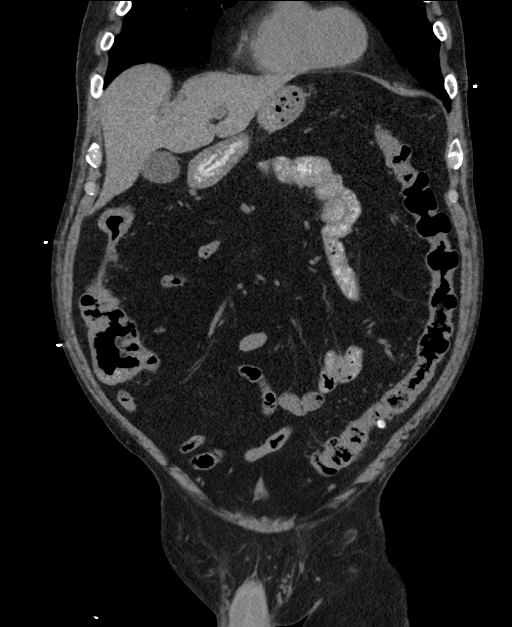
[im 67/151  soft-tissue]
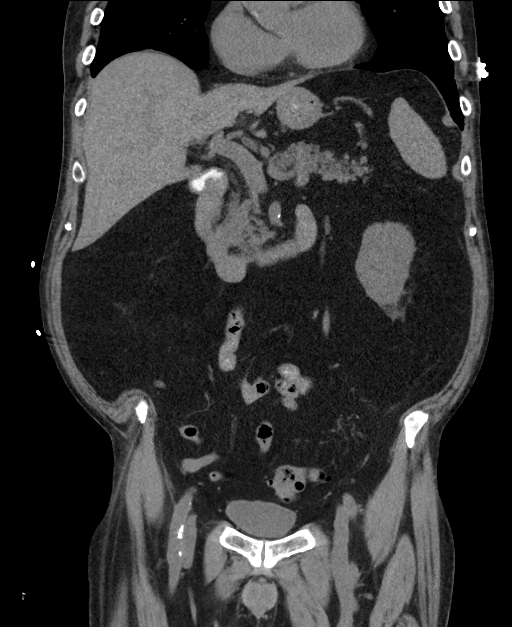
[im 84/151  soft-tissue]
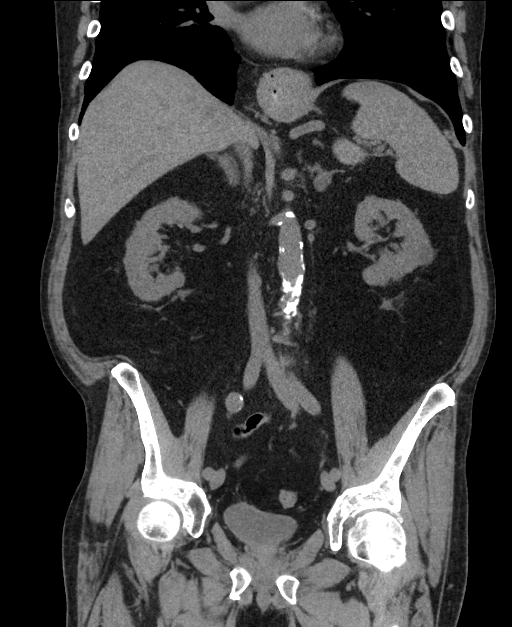

[16 of 46 positions shown; findings below may reference images not displayed]

FINDINGS: Lower chest: Minimal atelectatic changes are noted in the left
medial lung base. Moderate size hiatal hernia is noted.

Hepatobiliary: The liver is within normal limits. A few dependent
gallstones are noted without complicating factors.

Pancreas: No mass or inflammatory process identified on this
un-enhanced exam.

Spleen: Within normal limits in size.

Adrenals/Urinary Tract: Right adrenal gland is within normal limits.
Fullness of the left adrenal gland is again seen and stable. The
kidneys show no calculi or obstructive changes. A left renal cyst is
again noted. Bladder is well distended. Prostatic calcifications are
seen.

Stomach/Bowel: No evidence of obstruction, inflammatory process, or
abnormal fluid collections. The appendix is within normal limits.
Scattered diverticular change is noted

Vascular/Lymphatic: Aortic calcifications are noted without
aneurysmal dilatation. No significant lymphadenopathy is noted.

Reproductive: No mass or other significant abnormality.

Other: None.

Musculoskeletal:  No suspicious bone lesions identified.
IMPRESSION: Diverticulosis without diverticulitis.

Cholelithiasis without complicating factors.

Hiatal hernia.

Other chronic changes as described above.

## 2018-04-11 ENCOUNTER — Other Ambulatory Visit: Payer: Self-pay

## 2018-04-11 NOTE — Patient Outreach (Signed)
[facility]) Care Management  04/11/2018  EFTON THOMLEY Sr. 31-Aug-1934 [id number]    1st unsuccessful outreach attempt to the patient for initial assessment. No answer. HIPAA compliant voicemail left with contact information.   Plan: RN Health Coach will make another outreach attempt to the patient within thirty business days.  Lazaro Arms RN, BSN, [place] Direct Dial:  [phone]  Fax: [phone]

## 2018-04-14 DIAGNOSIS — Z8551 Personal history of malignant neoplasm of bladder: Secondary | ICD-10-CM | POA: Diagnosis not present

## 2018-04-17 ENCOUNTER — Telehealth: Payer: Self-pay | Admitting: Cardiology

## 2018-04-17 NOTE — Telephone Encounter (Signed)
Received outpatient page regarding [name] with no answer upon return call.  Kathyrn Drown NP-C [place] Pager: [phone]

## 2018-04-18 ENCOUNTER — Telehealth: Payer: Self-pay | Admitting: Internal Medicine

## 2018-04-18 NOTE — Telephone Encounter (Signed)
Pt c/o BP issue:  1. What are your last 5 BP readings? 129/78 HR 86, 140/82 Hr 85 153/93 HR 52 2. Are you having any other symptoms (ex. Dizziness, headache, blurred vision, passed out)? Dizziness  3. What is your medication issue? No

## 2018-04-18 NOTE — Telephone Encounter (Signed)
Spoke with patient. Reporting last night feeling the PVCs and noted pulse to be 52 for approx 5 min before bouncing back up. He was worried his heart would quit. Reinforced that Dr. Harrington Challenger is aware that he has PVCs and that since they are bothersome to him he should try taking the 1/2 atenolol to when he notices them since it may help. He asked if he could take a half tab every day.  Adv he could try it but last time he did this his BP was getting too low. --he was also taking toprol at that time as well.  He will try the atenolol 12.5 mg to see if helps palpitations.  Is planning to take every day for now. Adv to call back if this does not help or BP gets low.

## 2018-05-09 ENCOUNTER — Other Ambulatory Visit: Payer: Self-pay

## 2018-05-09 NOTE — Patient Outreach (Signed)
[facility]) Care Management  05/09/2018  PEREZ DIRICO Sr. 09-16-1934 [id number]    2nd unsuccessful attempt to reach the patient for initial assessment. No answer. HIPAA compliant voicemail left with contact information.  Plan: RN Health Coach will send letter. RN Health Coach will make another outreach attempt to the patient within thirty business days.   Lazaro Arms RN, BSN, [place] Direct Dial:  [phone]  Fax: [phone]

## 2018-05-15 DIAGNOSIS — H722X1 Other marginal perforations of tympanic membrane, right ear: Secondary | ICD-10-CM | POA: Diagnosis not present

## 2018-05-17 ENCOUNTER — Other Ambulatory Visit: Payer: Self-pay | Admitting: *Deleted

## 2018-05-17 MED ORDER — FLUTICASONE PROPIONATE 50 MCG/ACT NA SUSP: 2.0000 | Freq: Every day | NASAL | 11 refills | Status: DC

## 2018-05-26 ENCOUNTER — Other Ambulatory Visit: Payer: Self-pay | Admitting: Family Medicine

## 2018-05-29 ENCOUNTER — Encounter: Payer: Self-pay | Admitting: Nurse Practitioner

## 2018-05-29 ENCOUNTER — Ambulatory Visit (INDEPENDENT_AMBULATORY_CARE_PROVIDER_SITE_OTHER): Payer: PPO | Admitting: Nurse Practitioner

## 2018-05-29 VITALS — BP 115/65 | HR 70 | Temp 97.0°F | Ht 72.0 in | Wt 200.0 lb

## 2018-05-29 DIAGNOSIS — J0100 Acute maxillary sinusitis, unspecified: Secondary | ICD-10-CM

## 2018-05-29 MED ORDER — AZITHROMYCIN 250 MG PO TABS: ORAL_TABLET | ORAL | 0 refills | Status: DC

## 2018-05-29 NOTE — Progress Notes (Signed)
Subjective:    Patient ID: Ronald Morrow., male    DOB: Feb 13, 1935, 83 y.o.   MRN: [id number]   Chief Complaint: Sinus Problem and Places on top of head   HPI Patient comes in today C/O; -Patient comes in c/o runny nose congestion and headache. He had ear infection several weeks ago and was given ear drops. Still does not feel right. - has some pimple like lesions on scalp that hve been there for several years and he just wants them looked at.  Review of Systems  Constitutional: Negative for chills and fever.  HENT: Positive for ear pain, rhinorrhea, sinus pressure and sinus pain. Negative for sore throat and trouble swallowing.   Respiratory: Positive for cough.   Cardiovascular: Negative.   Gastrointestinal: Negative.   Genitourinary: Negative.   Neurological: Positive for headaches.  Psychiatric/Behavioral: Negative.   All other systems reviewed and are negative.      Objective:   Physical Exam Constitutional:      Appearance: Normal appearance. He is normal weight.  HENT:     Head: Normocephalic.     Right Ear: Tympanic membrane, ear canal and external ear normal.     Left Ear: Tympanic membrane, ear canal and external ear normal.     Ears:     Comments: Right ear canal very tiny and difficult to see full ear drum    Nose: No nasal tenderness.     Right Sinus: Maxillary sinus tenderness present. No frontal sinus tenderness.     Left Sinus: Maxillary sinus tenderness present. No frontal sinus tenderness.     Mouth/Throat:     Mouth: Mucous membranes are moist.  Eyes:     Pupils: Pupils are equal, round, and reactive to light.  Neck:     Musculoskeletal: Normal range of motion.  Cardiovascular:     Rate and Rhythm: Normal rate and regular rhythm.  Pulmonary:     Effort: Pulmonary effort is normal.     Breath sounds: Normal breath sounds.  Musculoskeletal: Normal range of motion.  Skin:    General: Skin is warm and dry.  Neurological:     General: No focal  deficit present.     Mental Status: He is alert.  Psychiatric:        Mood and Affect: Mood normal.        Behavior: Behavior normal.    BP 115/65   Pulse 70   Temp (!) 97 F (36.1 C) (Oral)   Ht 6' (1.829 m)   Wt 200 lb (90.7 kg)   BMI 27.12 kg/m         Assessment & Plan:  Ronald Fear Sr. in today with chief complaint of Sinus Problem and Places on top of head   1. Acute maxillary sinusitis, recurrence not specified 1. Take meds as prescribed 2. Use a cool mist humidifier especially during the winter months and when heat has been humid. 3. Use saline nose sprays frequently 4. Saline irrigations of the nose can be very helpful if done frequently.  * 4X daily for 1 week*  * Use of a nettie pot can be helpful with this. Follow directions with this* 5. Drink plenty of fluids 6. Keep thermostat turn down low 7.For any cough or congestion  Use plain Mucinex- regular strength or max strength is fine   * Children- consult with Pharmacist for dosing 8. For fever or aces or pains- take tylenol or ibuprofen appropriate for age and weight.  *  for fevers greater than 101 orally you may alternate ibuprofen and tylenol every  3 hours.    - azithromycin (ZITHROMAX Z-PAK) 250 MG tablet; As directed  Dispense: 6 tablet; Refill: 0  Mary-Margaret Hassell Done, FNP

## 2018-05-29 NOTE — Patient Instructions (Signed)

## 2018-05-31 DIAGNOSIS — M5386 Other specified dorsopathies, lumbar region: Secondary | ICD-10-CM | POA: Diagnosis not present

## 2018-05-31 DIAGNOSIS — M9903 Segmental and somatic dysfunction of lumbar region: Secondary | ICD-10-CM | POA: Diagnosis not present

## 2018-05-31 DIAGNOSIS — M9902 Segmental and somatic dysfunction of thoracic region: Secondary | ICD-10-CM | POA: Diagnosis not present

## 2018-05-31 DIAGNOSIS — M7918 Myalgia, other site: Secondary | ICD-10-CM | POA: Diagnosis not present

## 2018-06-05 DIAGNOSIS — M5386 Other specified dorsopathies, lumbar region: Secondary | ICD-10-CM | POA: Diagnosis not present

## 2018-06-05 DIAGNOSIS — M7918 Myalgia, other site: Secondary | ICD-10-CM | POA: Diagnosis not present

## 2018-06-05 DIAGNOSIS — M9903 Segmental and somatic dysfunction of lumbar region: Secondary | ICD-10-CM | POA: Diagnosis not present

## 2018-06-05 DIAGNOSIS — M9902 Segmental and somatic dysfunction of thoracic region: Secondary | ICD-10-CM | POA: Diagnosis not present

## 2018-06-08 ENCOUNTER — Other Ambulatory Visit: Payer: Self-pay

## 2018-06-08 NOTE — Patient Outreach (Signed)
[facility]) Care Management  06/08/2018   Ronald Russell Sr. Sep 30, 1934 [id number]      Outreach attempt # 3 to the patient for initial assessment. HIPAA verified with patient.  Discussed and offered [facility] care management services with patient. Patient verbally agreed to services.   Social: The patient lives in the home with his wife.  He is able to perform his ADLS/IADLS independently.  He denies any pain or falls..  The patient has transportation to his appointments. The patient denies any pain or falls. Durable medical equipment in the home consist of scales, cane, walker and blood pressure cuff.  Conditions: Per chart review and conversation with the patient his conditions include HTN, CAD, Gout and Hypokalemia.  The patient states that he has open heart surgery in June of this year.  He is still recovering  and has occasional flutters. He states that he does not check his blood pressure daily but three times a week or if he feels bad.  His blood pressure has been running 108/78 HR 70 and 125/70 HR 68.  The patient states that he does not have any salt in his diet but will eat what he wants.  He does exercise in the home and will go for walks.  Medications: The patient is on thirteen medications.  He states that he is able to manage his medications. He did not express any concerns about paying for his medications.  Appointments:  The patient has an appointment with his cardiologist  Dr Harrington Challenger on June 15, 2017  Advanced Directives: The patient does not have an advanced directive and asked if I could send him the information.  Current Medications:  Current Outpatient Medications  Medication Sig Dispense Refill  . acetaminophen (TYLENOL) 500 MG tablet Take 1,000 mg by mouth every 6 (six) hours as needed for headache (pain).    Marland Kitchen aspirin EC 81 MG tablet Take 1 tablet (81 mg total) by mouth daily. 90 tablet 3  . atenolol (TENORMIN) 25 MG tablet Take 0.5 tablets (12.5 mg  total) by mouth as needed. MAY RAKE 12.5 TO 25 MG AS NEEDED    . CALCIUM-MAGNESIUM-VITAMIN D PO Take 2 tablets by mouth at bedtime.     . cetirizine (ZYRTEC) 10 MG tablet Take 10 mg by mouth daily.    . fluocinonide cream (LIDEX) 0.05 % APPLY TOPICALLY TWICE DAILY 30 g 0  . fluticasone (FLONASE) 50 MCG/ACT nasal spray Place 2 sprays into both nostrils at bedtime. 48 g 11  . guaiFENesin (MUCINEX) 600 MG 12 hr tablet Take 600 mg by mouth 2 (two) times daily.    . pantoprazole (PROTONIX) 40 MG tablet TAKE 1 TABLET BY MOUTH EVERY DAY 90 tablet 1  . Polyvinyl Alcohol-Povidone (REFRESH OP) Place 1 drop into both eyes at bedtime as needed (dry eyes).    . Potassium Gluconate 550 MG TABS Take 1 tablet (550 mg total) by mouth at bedtime. Need to take 1 tab one day and 2 the next day alternating 30 tablet 0  . sildenafil (REVATIO) 20 MG tablet Take 1 tablet (20 mg total) by mouth daily as needed (2-5 as needed). 50 tablet 5  . azithromycin (ZITHROMAX Z-PAK) 250 MG tablet As directed (Patient not taking: Reported on 06/08/2018) 6 tablet 0  . omeprazole (PRILOSEC) 40 MG capsule Take 40 mg by mouth 2 (two) times daily.     No current facility-administered medications for this visit.     Functional Status:  In your  present state of health, do you have any difficulty performing the following activities: 12/01/2017  Hearing? Y  Vision? N  Difficulty concentrating or making decisions? N  Walking or climbing stairs? N  Dressing or bathing? N  Doing errands, shopping? N  Some recent data might be hidden    Fall/Depression Screening: Fall Risk  05/29/2018 02/17/2018 01/07/2018  Falls in the past year? 0 No No  Number falls in past yr: - - -  Injury with Fall? - - -  Follow up - - -   PHQ 2/9 Scores 05/29/2018 03/15/2018 02/17/2018 01/07/2018 01/05/2018 12/01/2017 11/16/2017  PHQ - 2 Score 0 0 0 0 0 0 0  PHQ- 9 Score - - - - - - -    Assessment: Patient will benefit from health coach outreach for disease  management and support.   THN CM Care Plan Problem One     Most Recent Value  Care Plan Problem One  knowledge deficit relateed to Hypertention  Role Documenting the Problem One  [place] for Problem One  Active  THN Long Term Goal   In 30 days the  will verbalize that his blood pressure has remained  aroung 125/70.  THN Long Term Goal Start Date  06/08/18  Interventions for Problem One Long Term Goal  Discussed decrease salt the diet eating health foods, exercise, monitoring blood pressure, manage stress      Plan: RN Health Coach will provide ongoing education for patient on Hypertension through phone calls and sending printed information to patient for further discussion.  RN Health Coach will send welcome packet with consent to patient as well as printed information on Hypertension.  RN Health Coach will send initial barriers letter, assessment, and care plan to primary care physician.  RN Health Coach will contact patient in the month of January and patient agrees to next outreach.   Lazaro Arms RN, BSN, [place] Direct Dial:  [phone]  Fax: [phone]

## 2018-06-15 DIAGNOSIS — M9903 Segmental and somatic dysfunction of lumbar region: Secondary | ICD-10-CM | POA: Diagnosis not present

## 2018-06-15 DIAGNOSIS — M7918 Myalgia, other site: Secondary | ICD-10-CM | POA: Diagnosis not present

## 2018-06-15 DIAGNOSIS — M9902 Segmental and somatic dysfunction of thoracic region: Secondary | ICD-10-CM | POA: Diagnosis not present

## 2018-06-15 DIAGNOSIS — M5386 Other specified dorsopathies, lumbar region: Secondary | ICD-10-CM | POA: Diagnosis not present

## 2018-06-19 DIAGNOSIS — M7918 Myalgia, other site: Secondary | ICD-10-CM | POA: Diagnosis not present

## 2018-06-19 DIAGNOSIS — M9902 Segmental and somatic dysfunction of thoracic region: Secondary | ICD-10-CM | POA: Diagnosis not present

## 2018-06-19 DIAGNOSIS — M5386 Other specified dorsopathies, lumbar region: Secondary | ICD-10-CM | POA: Diagnosis not present

## 2018-06-19 DIAGNOSIS — M9903 Segmental and somatic dysfunction of lumbar region: Secondary | ICD-10-CM | POA: Diagnosis not present

## 2018-06-20 ENCOUNTER — Other Ambulatory Visit: Payer: Self-pay | Admitting: *Deleted

## 2018-06-20 MED ORDER — FLUOCINONIDE 0.05 % EX CREA: TOPICAL_CREAM | Freq: Two times a day (BID) | CUTANEOUS | 5 refills | Status: DC

## 2018-06-20 NOTE — Addendum Note (Signed)
Addended by: Virgel Manifold on: 06/20/2018 04:58 PM   Modules accepted: Miquel Dunn

## 2018-06-22 DIAGNOSIS — M5386 Other specified dorsopathies, lumbar region: Secondary | ICD-10-CM | POA: Diagnosis not present

## 2018-06-22 DIAGNOSIS — M9902 Segmental and somatic dysfunction of thoracic region: Secondary | ICD-10-CM | POA: Diagnosis not present

## 2018-06-22 DIAGNOSIS — M7918 Myalgia, other site: Secondary | ICD-10-CM | POA: Diagnosis not present

## 2018-06-22 DIAGNOSIS — M9903 Segmental and somatic dysfunction of lumbar region: Secondary | ICD-10-CM | POA: Diagnosis not present

## 2018-06-23 ENCOUNTER — Ambulatory Visit: Payer: PPO | Admitting: Internal Medicine

## 2018-06-28 DIAGNOSIS — M5386 Other specified dorsopathies, lumbar region: Secondary | ICD-10-CM | POA: Diagnosis not present

## 2018-06-28 DIAGNOSIS — M9903 Segmental and somatic dysfunction of lumbar region: Secondary | ICD-10-CM | POA: Diagnosis not present

## 2018-06-28 DIAGNOSIS — M7918 Myalgia, other site: Secondary | ICD-10-CM | POA: Diagnosis not present

## 2018-06-28 DIAGNOSIS — M9902 Segmental and somatic dysfunction of thoracic region: Secondary | ICD-10-CM | POA: Diagnosis not present

## 2018-07-03 DIAGNOSIS — M9902 Segmental and somatic dysfunction of thoracic region: Secondary | ICD-10-CM | POA: Diagnosis not present

## 2018-07-03 DIAGNOSIS — M5386 Other specified dorsopathies, lumbar region: Secondary | ICD-10-CM | POA: Diagnosis not present

## 2018-07-03 DIAGNOSIS — M9903 Segmental and somatic dysfunction of lumbar region: Secondary | ICD-10-CM | POA: Diagnosis not present

## 2018-07-03 DIAGNOSIS — M7918 Myalgia, other site: Secondary | ICD-10-CM | POA: Diagnosis not present

## 2018-07-06 ENCOUNTER — Other Ambulatory Visit: Payer: Self-pay

## 2018-07-06 NOTE — Patient Outreach (Signed)
[facility]) Care Management  07/06/2018   Ronald Russell Sr. 1934-12-26 [id number]  Subjective: Telephone outreach to the patient for assessment.  HIPAA verified. Patient states the he received the information that I had mailed to him. The patient states the is not feeling well today.  He states that he has been having heart flutters.  He has an appointment with Dr. Kathlen Mody cardiologist on 1/24.  He denies any pain or falls.  He states that his blood pressure today was 127/68 and heart rate 62.  He states that he is monitoring the salt and caffeine in his diet. He is taking his medication as prescribed.  Current Medications:  Current Outpatient Medications  Medication Sig Dispense Refill  . aspirin EC 81 MG tablet Take 1 tablet (81 mg total) by mouth daily. 90 tablet 3  . atenolol (TENORMIN) 25 MG tablet Take 0.5 tablets (12.5 mg total) by mouth as needed. MAY RAKE 12.5 TO 25 MG AS NEEDED    . CALCIUM-MAGNESIUM-VITAMIN D PO Take 2 tablets by mouth at bedtime.     . cetirizine (ZYRTEC) 10 MG tablet Take 10 mg by mouth daily.    . fluocinonide cream (LIDEX) 0.05 % Apply topically 2 (two) times daily. 30 g 5  . fluticasone (FLONASE) 50 MCG/ACT nasal spray Place 2 sprays into both nostrils at bedtime. 48 g 11  . guaiFENesin (MUCINEX) 600 MG 12 hr tablet Take 600 mg by mouth 2 (two) times daily.    Marland Kitchen omeprazole (PRILOSEC) 40 MG capsule Take 40 mg by mouth 2 (two) times daily.    . Polyvinyl Alcohol-Povidone (REFRESH OP) Place 1 drop into both eyes at bedtime as needed (dry eyes).    . Potassium Gluconate 550 MG TABS Take 1 tablet (550 mg total) by mouth at bedtime. Need to take 1 tab one day and 2 the next day alternating 30 tablet 0  . sildenafil (REVATIO) 20 MG tablet Take 1 tablet (20 mg total) by mouth daily as needed (2-5 as needed). 50 tablet 5  . acetaminophen (TYLENOL) 500 MG tablet Take 1,000 mg by mouth every 6 (six) hours as needed for headache (pain).    Marland Kitchen azithromycin  (ZITHROMAX Z-PAK) 250 MG tablet As directed (Patient not taking: Reported on 06/08/2018) 6 tablet 0  . pantoprazole (PROTONIX) 40 MG tablet TAKE 1 TABLET BY MOUTH EVERY DAY (Patient not taking: Reported on 07/06/2018) 90 tablet 1   No current facility-administered medications for this visit.     Functional Status:  In your present state of health, do you have any difficulty performing the following activities: 06/09/2018 12/01/2017  Hearing? Tempie Donning  Vision? N N  Difficulty concentrating or making decisions? N N  Walking or climbing stairs? N N  Dressing or bathing? N N  Doing errands, shopping? N N  Some recent data might be hidden    Fall/Depression Screening: Fall Risk  07/06/2018 05/29/2018 02/17/2018  Falls in the past year? 0 0 No  Number falls in past yr: - - -  Injury with Fall? - - -  Follow up - - -   PHQ 2/9 Scores 05/29/2018 03/15/2018 02/17/2018 01/07/2018 01/05/2018 12/01/2017 11/16/2017  PHQ - 2 Score 0 0 0 0 0 0 0  PHQ- 9 Score - - - - - - -    Assessment: Patient will continue to benefit from health coach outreach for disease management and support. [facility] CM Care Plan Problem One     Most Recent Value  THN Long Term Goal   In 30 days the  will verbalize that his blood pressure has remained  aroung 125/70.  THN Long Term Goal Start Date  07/06/18  Interventions for Problem One Long Term Goal  Reviewed signs and symptoms of HTN, Diet, medication adherence.       Plan: RN Health Coach will contact patient in the month of February and patient agrees to next outreach.   Lazaro Arms RN, BSN, [place] Direct Dial:  [phone]  Fax: [phone]

## 2018-07-07 ENCOUNTER — Ambulatory Visit: Payer: PPO | Admitting: Physician Assistant

## 2018-07-07 ENCOUNTER — Encounter: Payer: Self-pay | Admitting: Physician Assistant

## 2018-07-07 VITALS — BP 120/60 | HR 60 | Ht 72.0 in | Wt 201.8 lb

## 2018-07-07 DIAGNOSIS — E782 Mixed hyperlipidemia: Secondary | ICD-10-CM | POA: Diagnosis not present

## 2018-07-07 DIAGNOSIS — R002 Palpitations: Secondary | ICD-10-CM

## 2018-07-07 DIAGNOSIS — I2510 Atherosclerotic heart disease of native coronary artery without angina pectoris: Secondary | ICD-10-CM | POA: Diagnosis not present

## 2018-07-07 MED ORDER — ROSUVASTATIN CALCIUM 5 MG PO TABS: 5.0000 mg | ORAL_TABLET | Freq: Every day | ORAL | 3 refills | Status: DC

## 2018-07-07 NOTE — Patient Instructions (Signed)
Medication Instructions:  Your physician has recommended you make the following change in your medication:  1. STOP PRAVASTATIN  2. START ROSUVASTATIN 5 MG DAILY.   If you need a refill on your cardiac medications before your next appointment, please call your pharmacy.   Lab work: TO BE DONE IN 8 weeks: LFTS, LIPIDS   If you have labs (blood work) drawn today and your tests are completely normal, you will receive your results only by: Marland Kitchen MyChart Message (if you have MyChart) OR . A paper copy in the mail If you have any lab test that is abnormal or we need to change your treatment, we will call you to review the results.  Testing/Procedures: NONE  Follow-Up: At [facility], you and your health needs are our priority.  As part of our continuing mission to provide you with exceptional heart care, we have created designated Provider Care Teams.  These Care Teams include your primary Cardiologist (physician) and Advanced Practice Providers (APPs -  Physician Assistants and Nurse Practitioners) who all work together to provide you with the care you need, when you need it. . You will need a follow up appointment in:  6 months.  Please call our office 2 months in advance to schedule this appointment.  You may see Dorris Carnes, MD or one of the following Advanced Practice Providers on your designated Care Team: . Richardson Dopp, PA-C . Vin Bhagat, PA-C . Daune Perch, NP  Any Other Special Instructions Will Be Listed Below (If Applicable).
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
## 2018-07-12 DIAGNOSIS — M7918 Myalgia, other site: Secondary | ICD-10-CM | POA: Diagnosis not present

## 2018-07-12 DIAGNOSIS — M5386 Other specified dorsopathies, lumbar region: Secondary | ICD-10-CM | POA: Diagnosis not present

## 2018-07-12 DIAGNOSIS — M9902 Segmental and somatic dysfunction of thoracic region: Secondary | ICD-10-CM | POA: Diagnosis not present

## 2018-07-12 DIAGNOSIS — M9903 Segmental and somatic dysfunction of lumbar region: Secondary | ICD-10-CM | POA: Diagnosis not present

## 2018-07-17 DIAGNOSIS — M7918 Myalgia, other site: Secondary | ICD-10-CM | POA: Diagnosis not present

## 2018-07-17 DIAGNOSIS — M5386 Other specified dorsopathies, lumbar region: Secondary | ICD-10-CM | POA: Diagnosis not present

## 2018-07-17 DIAGNOSIS — M9903 Segmental and somatic dysfunction of lumbar region: Secondary | ICD-10-CM | POA: Diagnosis not present

## 2018-07-17 DIAGNOSIS — M9902 Segmental and somatic dysfunction of thoracic region: Secondary | ICD-10-CM | POA: Diagnosis not present

## 2018-08-03 ENCOUNTER — Ambulatory Visit: Payer: Self-pay

## 2018-08-08 ENCOUNTER — Other Ambulatory Visit: Payer: Self-pay

## 2018-08-08 NOTE — Patient Outreach (Signed)
[facility]) Care Management  08/08/2018   Ronald KOCHAN Sr. August 05, 1934 [id number]  Subjective: Successful outreach to the patient. HIPAA verified.  The patient states that he has been doing fine but is having pain in his right ear.  He states that he has had pain before and received medication that he used for five days and it helped but this time when he used the medication it hurt his ear. Patient states that he has an appointment to follow up on his ear.  The patient states that he does not check his blood pressure everyday he may check it twice a week or when he does not feel good. His blood pressure two days ago was 128/78 and HR 65. Discussed with the patient about checking his blood pressure daily and maintaining a low salt diet.  He states that he does not use salt in his food at all. Discussed with the patient about limiting the stress in life and how it can affect his blood pressure.  He states that he would like for me to send him information on stress.  Current Medications:  Current Outpatient Medications  Medication Sig Dispense Refill  . acetaminophen (TYLENOL) 650 MG CR tablet Take 650 mg by mouth every 6 (six) hours as needed for pain.    Marland Kitchen aspirin EC 81 MG tablet Take 1 tablet (81 mg total) by mouth daily. 90 tablet 3  . atenolol (TENORMIN) 25 MG tablet Take 0.5 tablets (12.5 mg total) by mouth as needed. MAY RAKE 12.5 TO 25 MG AS NEEDED    . CALCIUM-MAGNESIUM-VITAMIN D PO Take 2 tablets by mouth at bedtime.     . fluticasone (FLONASE) 50 MCG/ACT nasal spray Place 2 sprays into both nostrils at bedtime. 48 g 11  . guaiFENesin (MUCINEX) 600 MG 12 hr tablet Take 600 mg by mouth 2 (two) times daily.    . pantoprazole (PROTONIX) 40 MG tablet Take 40 mg by mouth 2 (two) times daily.    . Potassium Gluconate 550 MG TABS Take 1 tablet (550 mg total) by mouth at bedtime. Need to take 1 tab one day and 2 the next day alternating 30 tablet 0  . rosuvastatin (CRESTOR) 5  MG tablet Take 1 tablet (5 mg total) by mouth daily. 90 tablet 3  . sildenafil (REVATIO) 20 MG tablet Take 1 tablet (20 mg total) by mouth daily as needed (2-5 as needed). 50 tablet 5  . cetirizine (ZYRTEC) 10 MG tablet Take 10 mg by mouth daily.    . fluocinonide cream (LIDEX) 0.05 % Apply topically 2 (two) times daily. 30 g 5  . omeprazole (PRILOSEC) 40 MG capsule Take 40 mg by mouth 2 (two) times daily.     No current facility-administered medications for this visit.     Functional Status:  In your present state of health, do you have any difficulty performing the following activities: 06/09/2018 12/01/2017  Hearing? Tempie Donning  Vision? N N  Difficulty concentrating or making decisions? N N  Walking or climbing stairs? N N  Dressing or bathing? N N  Doing errands, shopping? N N  Some recent data might be hidden    Fall/Depression Screening: Fall Risk  08/08/2018 07/06/2018 05/29/2018  Falls in the past year? 0 0 0  Number falls in past yr: - - -  Injury with Fall? - - -  Follow up - - -   PHQ 2/9 Scores 05/29/2018 03/15/2018 02/17/2018 01/07/2018 01/05/2018 12/01/2017 11/16/2017  PHQ -  2 Score 0 0 0 0 0 0 0  PHQ- 9 Score - - - - - - -    Assessment: Patient will continue to benefit from health coach outreach for disease management and support. THN CM Care Plan Problem One     Most Recent Value  THN Long Term Goal   In 60 days the patient will verbalize that he has kept his blood pressure around 130/70.  THN Long Term Goal Start Date  08/08/18  Interventions for Problem One Long Term Goal  Discussed with the patient about checking blood pressures daily, how salt in the diet affect his blood pressure, stress and medication adherence.       Plan: RN Health Coach will contact patient in the month of April and patient agrees to next outreach. Sending patient information on stress and relaxation techniques.  Lazaro Arms RN, BSN, [place] Direct Dial:  [phone]  Fax: [phone]

## 2018-08-10 DIAGNOSIS — H6123 Impacted cerumen, bilateral: Secondary | ICD-10-CM | POA: Diagnosis not present

## 2018-08-10 DIAGNOSIS — H722X1 Other marginal perforations of tympanic membrane, right ear: Secondary | ICD-10-CM | POA: Diagnosis not present

## 2018-08-15 ENCOUNTER — Other Ambulatory Visit: Payer: Self-pay

## 2018-08-15 MED ORDER — ATENOLOL 25 MG PO TABS: 25.0000 mg | ORAL_TABLET | ORAL | 2 refills | Status: DC | PRN

## 2018-08-21 ENCOUNTER — Telehealth: Payer: Self-pay | Admitting: Internal Medicine

## 2018-08-21 NOTE — Telephone Encounter (Signed)
Pharmacy called and needed clarification over rx instructions. Pt was unsure how to take   atenolol (TENORMIN) 25 MG tablet Take 1 tablet (25 mg total) by mouth as needed. MAY RAKE 12.5 TO 25 MG AS NEEDED  Please verify and send new rx to pharmacy    [id number] - [place], [place] - [address] [place]

## 2018-08-24 ENCOUNTER — Other Ambulatory Visit: Payer: Self-pay

## 2018-08-24 NOTE — Patient Outreach (Signed)
[facility]) Care Management  08/24/2018  Ronald KEAN Sr. Dec 28, 1934 [id number]    Received call from the patient regarding a letter his chiropractor received.  HIPAA verified.  The patient states that he is unable to see his chiropractor.  He states that he was told by the chiropractors office the letter said that they could be charged for fraud.  The patient does not understand what is going on. The patient wanted to know if we [facility]) would have sent that type of letter.  I explained to the patient that we would not.  He states that he did speak with his insurance company and they did not send any letter. I advised the patient to call and request a copy of the letter to get more information.  The patient thanked me for the information.  Lazaro Arms RN, BSN, [place] Direct Dial:  [phone]  Fax: [phone]

## 2018-08-28 ENCOUNTER — Other Ambulatory Visit: Payer: PPO

## 2018-09-18 ENCOUNTER — Emergency Department (HOSPITAL_COMMUNITY): Payer: PPO

## 2018-09-18 ENCOUNTER — Other Ambulatory Visit: Payer: Self-pay

## 2018-09-18 ENCOUNTER — Emergency Department (HOSPITAL_COMMUNITY)
Admission: EM | Admit: 2018-09-18 | Discharge: 2018-09-18 | Disposition: A | Discharge status: Home / Self Care | Payer: PPO | Attending: Emergency Medicine | Admitting: Emergency Medicine

## 2018-09-18 ENCOUNTER — Encounter (HOSPITAL_COMMUNITY): Payer: Self-pay | Admitting: Emergency Medicine

## 2018-09-18 DIAGNOSIS — Z8551 Personal history of malignant neoplasm of bladder: Secondary | ICD-10-CM | POA: Insufficient documentation

## 2018-09-18 DIAGNOSIS — Z87891 Personal history of nicotine dependence: Secondary | ICD-10-CM | POA: Insufficient documentation

## 2018-09-18 DIAGNOSIS — M8458XD Pathological fracture in neoplastic disease, other specified site, subsequent encounter for fracture with routine healing: Secondary | ICD-10-CM | POA: Diagnosis not present

## 2018-09-18 DIAGNOSIS — W208XXA Other cause of strike by thrown, projected or falling object, initial encounter: Secondary | ICD-10-CM | POA: Diagnosis not present

## 2018-09-18 DIAGNOSIS — I10 Essential (primary) hypertension: Secondary | ICD-10-CM | POA: Insufficient documentation

## 2018-09-18 DIAGNOSIS — Y999 Unspecified external cause status: Secondary | ICD-10-CM | POA: Insufficient documentation

## 2018-09-18 DIAGNOSIS — R42 Dizziness and giddiness: Secondary | ICD-10-CM | POA: Diagnosis not present

## 2018-09-18 DIAGNOSIS — I2510 Atherosclerotic heart disease of native coronary artery without angina pectoris: Secondary | ICD-10-CM | POA: Insufficient documentation

## 2018-09-18 DIAGNOSIS — Y92007 Garden or yard of unspecified non-institutional (private) residence as the place of occurrence of the external cause: Secondary | ICD-10-CM | POA: Diagnosis not present

## 2018-09-18 DIAGNOSIS — Z7901 Long term (current) use of anticoagulants: Secondary | ICD-10-CM | POA: Diagnosis not present

## 2018-09-18 DIAGNOSIS — Z9181 History of falling: Secondary | ICD-10-CM | POA: Diagnosis not present

## 2018-09-18 DIAGNOSIS — Z87440 Personal history of urinary (tract) infections: Secondary | ICD-10-CM | POA: Diagnosis not present

## 2018-09-18 DIAGNOSIS — Z7982 Long term (current) use of aspirin: Secondary | ICD-10-CM | POA: Diagnosis not present

## 2018-09-18 DIAGNOSIS — Z23 Encounter for immunization: Secondary | ICD-10-CM | POA: Insufficient documentation

## 2018-09-18 DIAGNOSIS — C787 Secondary malignant neoplasm of liver and intrahepatic bile duct: Secondary | ICD-10-CM | POA: Diagnosis not present

## 2018-09-18 DIAGNOSIS — G2582 Stiff-man syndrome: Secondary | ICD-10-CM | POA: Diagnosis not present

## 2018-09-18 DIAGNOSIS — E538 Deficiency of other specified B group vitamins: Secondary | ICD-10-CM | POA: Diagnosis not present

## 2018-09-18 DIAGNOSIS — J9611 Chronic respiratory failure with hypoxia: Secondary | ICD-10-CM | POA: Diagnosis not present

## 2018-09-18 DIAGNOSIS — Y93H2 Activity, gardening and landscaping: Secondary | ICD-10-CM | POA: Diagnosis not present

## 2018-09-18 DIAGNOSIS — M8580 Other specified disorders of bone density and structure, unspecified site: Secondary | ICD-10-CM | POA: Diagnosis not present

## 2018-09-18 DIAGNOSIS — Z79899 Other long term (current) drug therapy: Secondary | ICD-10-CM | POA: Insufficient documentation

## 2018-09-18 DIAGNOSIS — C3411 Malignant neoplasm of upper lobe, right bronchus or lung: Secondary | ICD-10-CM | POA: Diagnosis not present

## 2018-09-18 DIAGNOSIS — M1990 Unspecified osteoarthritis, unspecified site: Secondary | ICD-10-CM | POA: Diagnosis not present

## 2018-09-18 DIAGNOSIS — J449 Chronic obstructive pulmonary disease, unspecified: Secondary | ICD-10-CM | POA: Diagnosis not present

## 2018-09-18 DIAGNOSIS — I2699 Other pulmonary embolism without acute cor pulmonale: Secondary | ICD-10-CM | POA: Diagnosis not present

## 2018-09-18 DIAGNOSIS — Z79891 Long term (current) use of opiate analgesic: Secondary | ICD-10-CM | POA: Diagnosis not present

## 2018-09-18 DIAGNOSIS — F17210 Nicotine dependence, cigarettes, uncomplicated: Secondary | ICD-10-CM | POA: Diagnosis not present

## 2018-09-18 DIAGNOSIS — Z202 Contact with and (suspected) exposure to infections with a predominantly sexual mode of transmission: Secondary | ICD-10-CM | POA: Diagnosis not present

## 2018-09-18 DIAGNOSIS — C7951 Secondary malignant neoplasm of bone: Secondary | ICD-10-CM | POA: Diagnosis not present

## 2018-09-18 DIAGNOSIS — M545 Low back pain: Secondary | ICD-10-CM | POA: Diagnosis not present

## 2018-09-18 DIAGNOSIS — K589 Irritable bowel syndrome without diarrhea: Secondary | ICD-10-CM | POA: Diagnosis not present

## 2018-09-18 DIAGNOSIS — K219 Gastro-esophageal reflux disease without esophagitis: Secondary | ICD-10-CM | POA: Diagnosis not present

## 2018-09-18 DIAGNOSIS — F329 Major depressive disorder, single episode, unspecified: Secondary | ICD-10-CM | POA: Diagnosis not present

## 2018-09-18 DIAGNOSIS — G35 Multiple sclerosis: Secondary | ICD-10-CM | POA: Diagnosis not present

## 2018-09-18 DIAGNOSIS — G8929 Other chronic pain: Secondary | ICD-10-CM | POA: Diagnosis not present

## 2018-09-18 DIAGNOSIS — F419 Anxiety disorder, unspecified: Secondary | ICD-10-CM | POA: Diagnosis not present

## 2018-09-18 DIAGNOSIS — I214 Non-ST elevation (NSTEMI) myocardial infarction: Secondary | ICD-10-CM | POA: Diagnosis not present

## 2018-09-18 DIAGNOSIS — H524 Presbyopia: Secondary | ICD-10-CM | POA: Diagnosis not present

## 2018-09-18 DIAGNOSIS — S40012A Contusion of left shoulder, initial encounter: Secondary | ICD-10-CM | POA: Diagnosis not present

## 2018-09-18 DIAGNOSIS — S0101XA Laceration without foreign body of scalp, initial encounter: Secondary | ICD-10-CM

## 2018-09-18 DIAGNOSIS — H2513 Age-related nuclear cataract, bilateral: Secondary | ICD-10-CM | POA: Diagnosis not present

## 2018-09-18 MED ORDER — TETANUS-DIPHTH-ACELL PERTUSSIS 5-2.5-18.5 LF-MCG/0.5 IM SUSP
0.5000 mL | Freq: Once | INTRAMUSCULAR | Status: AC
  Administered 2018-09-18: 0.5 mL via INTRAMUSCULAR
  Filled 2018-09-18: qty 0.5

## 2018-09-18 NOTE — Discharge Instructions (Addendum)
You were seen today for a head injury.  Your CT scan is negative.  Staples were placed.  You need to have these removed in 10 days.  Apply ice as needed.  If you develop increasing headache, dizziness, any new or worsening symptoms you should be reevaluated.

## 2018-09-18 NOTE — ED Triage Notes (Signed)
Pt reports he was trimming limbs when one fell and hit him on the head. Pt has laceration to top left of head. Pt denies taking blood thinners. Pt denies falling. Pt reports he became lightheaded a few minutes afterwards but denies syncope.

## 2018-09-18 NOTE — ED Notes (Signed)
Pt's sps. Wendy Mikles Ph# [phone]. Pls contact for status updates.
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
## 2018-09-19 ENCOUNTER — Other Ambulatory Visit: Payer: Self-pay

## 2018-09-19 NOTE — Patient Outreach (Signed)
[facility]) Care Management  09/19/2018  Ronald ALDAVA Sr. 03/18/1935 [id number]    Nurse line call Date : 09/19/2018 Source: Nurse call center report Reason: Tree limb Hit the patient in the top of the head Insurance: HTA  Outreach attempt # 1 To patient.  HIPAA verified.  The patient states that he was outside cutting tree limbs and a dead limb feel and hit him in the head.  He received a laceration to his head with some  bleeding but did not pass out.  He went to ER and had a CT of the head done that showed no intercranial bleeding. The patient received staples and had a contusion on his shoulder. The patient states that he is doing fine this morning.  He does have a dull headache and is taking tylenol for the pain.  Advised the patient to keep his head wound clean and dry, use ice on his shoulder,  and follow up to have his staples removed in ten days as instructed.  Advised the patient to be observant for signs of infection fever, pus, redness, warm to touch and increasing pain.  He verbalized understanding.   Plan: RN Health Coach will outreach the patient at the next scheduled interval.   Lazaro Arms RN, BSN, [place]:  [phone] Fax: [phone]

## 2018-09-28 ENCOUNTER — Other Ambulatory Visit: Payer: PPO

## 2018-09-28 ENCOUNTER — Ambulatory Visit (INDEPENDENT_AMBULATORY_CARE_PROVIDER_SITE_OTHER): Payer: PPO | Admitting: Family Medicine

## 2018-09-28 ENCOUNTER — Encounter: Payer: Self-pay | Admitting: Family Medicine

## 2018-09-28 ENCOUNTER — Other Ambulatory Visit: Payer: Self-pay

## 2018-09-28 VITALS — BP 138/76 | HR 66 | Temp 97.3°F | Ht 72.0 in | Wt 205.0 lb

## 2018-09-28 DIAGNOSIS — R002 Palpitations: Secondary | ICD-10-CM | POA: Diagnosis not present

## 2018-09-28 DIAGNOSIS — S0003XA Contusion of scalp, initial encounter: Secondary | ICD-10-CM

## 2018-09-28 DIAGNOSIS — R42 Dizziness and giddiness: Secondary | ICD-10-CM | POA: Diagnosis not present

## 2018-09-28 DIAGNOSIS — H60392 Other infective otitis externa, left ear: Secondary | ICD-10-CM | POA: Diagnosis not present

## 2018-09-28 DIAGNOSIS — E785 Hyperlipidemia, unspecified: Secondary | ICD-10-CM | POA: Diagnosis not present

## 2018-09-28 MED ORDER — CIPROFLOXACIN-DEXAMETHASONE 0.3-0.1 % OT SUSP: 4.0000 [drp] | Freq: Two times a day (BID) | OTIC | 0 refills | Status: DC

## 2018-09-28 NOTE — Progress Notes (Signed)
Chief Complaint  Patient presents with  . Suture / Staple Removal    HPI  Patient presents today for while clearing some limits from a tree a dead limb from an adjacent tree fell striking him on the left parietal scalp.  This occurred 10 days ago.  He went to the emergency department and had stitches/staples placed.  They are now 2 staples in place and ready for removal.  He has been experiencing no pain in the area for the last several days.  However, patient notes that has been hurting for several weeks and is continuing.  He saw a specialist for this and all they told him was it looked like he was using Q-tips and walked out of the room.  He says the ear is still hurting and he has never used Q-tips.  He denies hearing loss acutely.  Some presbycusis is endorsed.  Patient has been having some palpitations he is a coronary artery disease patient with history of triple coronary bypass and June of last year.Marland Kitchen  He supposed to have s some blood work related to this for his cardiologist.  He is not sure what they need done.  However this has to be done before he can have a needed prostate procedure.  He request the blood work to be done in general today.  PMH: Smoking status noted ROS: Review of Systems  Constitutional: Negative.   HENT: Positive for ear pain.   Eyes: Negative for visual disturbance.  Respiratory: Negative for cough and shortness of breath.   Cardiovascular: Positive for palpitations. Negative for chest pain and leg swelling.  Gastrointestinal: Negative for abdominal pain, diarrhea, nausea and vomiting.  Genitourinary: Negative for difficulty urinating.  Musculoskeletal: Negative for arthralgias and myalgias.  Skin: Negative for rash.  Neurological: Negative for headaches.  Psychiatric/Behavioral: Negative for sleep disturbance.     Objective: BP 138/76   Pulse 66   Temp (!) 97.3 F (36.3 C) (Oral)   Ht 6' (1.829 m)   Wt 205 lb (93 kg)   BMI 27.80 kg/m  Gen: NAD,  alert, cooperative with exam HEENT: NCAT, EOMI, PERRL.  The left parietal scalp shows a fresh but well-healed laceration/contusion approximately 3 cm long with 2 staples in place.  There is no sign of infection.  The staples were removed without difficulty.  The left ear was evaluated and the TM appeared normal, however, the ear canal showed mild erythema and edema. CV: RRR, good S1/S2, no murmur Resp: CTABL, no wheezes, non-labored Abd: SNTND, BS present, no guarding or organomegaly Ext: No edema, warm Neuro: Alert and oriented, No gross deficits  Assessment and plan:  1. Palpitations   2. Dizziness   3. Contusion of scalp, initial encounter   4. Other infective acute otitis externa of left ear     Meds ordered this encounter  Medications  . ciprofloxacin-dexamethasone (CIPRODEX) OTIC suspension    Sig: Place 4 drops into the left ear 2 (two) times daily.    Dispense:  7.5 mL    Refill:  0    Orders Placed This Encounter  Procedures  . CBC with Differential/Platelet  . CMP14+EGFR  . Brain natriuretic peptide    Follow up as needed.  Claretta Fraise, MD

## 2018-09-29 LAB — CMP14+EGFR
ALT: 17 IU/L (ref 0–44)
AST: 18 IU/L (ref 0–40)
Albumin/Globulin Ratio: 2.4 — ABNORMAL HIGH (ref 1.2–2.2)
Albumin: 4.5 g/dL (ref 3.6–4.6)
Alkaline Phosphatase: 52 IU/L (ref 39–117)
BUN/Creatinine Ratio: 10 (ref 10–24)
BUN: 16 mg/dL (ref 8–27)
Bilirubin Total: 0.5 mg/dL (ref 0.0–1.2)
CO2: 21 mmol/L (ref 20–29)
Calcium: 9.3 mg/dL (ref 8.6–10.2)
Chloride: 104 mmol/L (ref 96–106)
Creatinine, Ser: 1.56 mg/dL — ABNORMAL HIGH (ref 0.76–1.27)
GFR calc Af Amer: 47 mL/min/{1.73_m2} — ABNORMAL LOW (ref >59)
GFR calc non Af Amer: 41 mL/min/{1.73_m2} — ABNORMAL LOW (ref >59)
Globulin, Total: 1.9 g/dL (ref 1.5–4.5)
Glucose: 104 mg/dL — ABNORMAL HIGH (ref 65–99)
Potassium: 4.3 mmol/L (ref 3.5–5.2)
Sodium: 143 mmol/L (ref 134–144)
Total Protein: 6.4 g/dL (ref 6.0–8.5)

## 2018-09-29 LAB — CBC WITH DIFFERENTIAL/PLATELET
Basophils Absolute: 0.1 10*3/uL (ref 0.0–0.2)
Basos: 1 %
EOS (ABSOLUTE): 0.3 10*3/uL (ref 0.0–0.4)
Eos: 5 %
Hematocrit: 37.7 % (ref 37.5–51.0)
Hemoglobin: 12.1 g/dL — ABNORMAL LOW (ref 13.0–17.7)
Immature Grans (Abs): 0 10*3/uL (ref 0.0–0.1)
Immature Granulocytes: 0 %
Lymphocytes Absolute: 1.1 10*3/uL (ref 0.7–3.1)
Lymphs: 22 %
MCH: 26.7 pg (ref 26.6–33.0)
MCHC: 32.1 g/dL (ref 31.5–35.7)
MCV: 83 fL (ref 79–97)
Monocytes Absolute: 0.6 10*3/uL (ref 0.1–0.9)
Monocytes: 11 %
Neutrophils Absolute: 3.2 10*3/uL (ref 1.4–7.0)
Neutrophils: 61 %
Platelets: 155 10*3/uL (ref 150–450)
RBC: 4.54 x10E6/uL (ref 4.14–5.80)
RDW: 15.7 % — ABNORMAL HIGH (ref 11.6–15.4)
WBC: 5.3 10*3/uL (ref 3.4–10.8)

## 2018-09-29 LAB — BRAIN NATRIURETIC PEPTIDE: BNP: 147.3 pg/mL — ABNORMAL HIGH (ref 0.0–100.0)

## 2018-09-29 NOTE — Progress Notes (Signed)
Hello Dee,  Your lab result is normal.Some minor variations that are not significant are commonly marked abnormal, but do not represent any medical problem for you.  Best regards, Claretta Fraise, M.D.

## 2018-10-05 ENCOUNTER — Other Ambulatory Visit: Payer: Self-pay

## 2018-10-05 ENCOUNTER — Telehealth: Payer: Self-pay | Admitting: Family Medicine

## 2018-10-05 NOTE — Patient Outreach (Addendum)
[facility]) Care Management  10/05/2018   Ronald OVERLEY Sr. 1935-03-24 [id number]  Subjective: Successful call to the patient.  HIPAA verified.  The patient states that he is doing fine.  The patient was seen at his PCP office on 4/16.  He had his staples removed and given Ciprodex for his ear pain.  The patients blood pressure at that time was 137/76.  He denies any signs or symptoms of HTN.  He states that he did some exercises yesterday and after resting for a couple of minutes his blood pressure was 128/68 pulse 68.  He states that he is taking his medications as prescribed and has food in the home.  The patient informed me that he has been engaged with [facility].  Informed the patient that I will no longer be calling him.  He stated that he understood and appreciated the call.     Current Medications:  Current Outpatient Medications  Medication Sig Dispense Refill  . acetaminophen (TYLENOL) 650 MG CR tablet Take 650 mg by mouth every 6 (six) hours as needed for pain.    Marland Kitchen aspirin EC 81 MG tablet Take 1 tablet (81 mg total) by mouth daily. 90 tablet 3  . atenolol (TENORMIN) 25 MG tablet Take 1 tablet (25 mg total) by mouth as needed. MAY RAKE 12.5 TO 25 MG AS NEEDED 90 tablet 2  . CALCIUM-MAGNESIUM-VITAMIN D PO Take 2 tablets by mouth at bedtime.     . cetirizine (ZYRTEC) 10 MG tablet Take 10 mg by mouth daily.    . ciprofloxacin-dexamethasone (CIPRODEX) OTIC suspension Place 4 drops into the left ear 2 (two) times daily. 7.5 mL 0  . fluocinonide cream (LIDEX) 0.05 % Apply topically 2 (two) times daily. 30 g 5  . fluticasone (FLONASE) 50 MCG/ACT nasal spray Place 2 sprays into both nostrils at bedtime. 48 g 11  . guaiFENesin (MUCINEX) 600 MG 12 hr tablet Take 600 mg by mouth 2 (two) times daily.    Marland Kitchen omeprazole (PRILOSEC) 40 MG capsule Take 40 mg by mouth 2 (two) times daily.    . Potassium Gluconate 550 MG TABS Take 1 tablet (550 mg total) by mouth at bedtime. Need to  take 1 tab one day and 2 the next day alternating 30 tablet 0  . rosuvastatin (CRESTOR) 5 MG tablet Take 1 tablet (5 mg total) by mouth daily. 90 tablet 3  . sildenafil (REVATIO) 20 MG tablet Take 1 tablet (20 mg total) by mouth daily as needed (2-5 as needed). 50 tablet 5   No current facility-administered medications for this visit.     Functional Status:  In your present state of health, do you have any difficulty performing the following activities: 06/09/2018 12/01/2017  Hearing? Tempie Donning  Vision? N N  Difficulty concentrating or making decisions? N N  Walking or climbing stairs? N N  Dressing or bathing? N N  Doing errands, shopping? N N  Some recent data might be hidden    Fall/Depression Screening: Fall Risk  10/05/2018 09/28/2018 08/08/2018  Falls in the past year? 0 0 0  Number falls in past yr: - - -  Injury with Fall? - - -  Follow up - - -   PHQ 2/9 Scores 09/28/2018 05/29/2018 03/15/2018 02/17/2018 01/07/2018 01/05/2018 12/01/2017  PHQ - 2 Score 0 0 0 0 0 0 0  PHQ- 9 Score 0 - - - - - -    Assessment: Patient is engaged with [facility]  Plan: RN Health Coach will close the program due to patient is with external program.  Ronald Arms RN, BSN, [place] Direct Dial:  [phone]  Fax: [phone]

## 2018-10-05 NOTE — Telephone Encounter (Signed)
[facility] Triage Telephone Call  10/05/2018  Mr Gasior called in today and is concerned with recent episodes of shortness of breath with exertion. He worked outdoors yesterday and after working for 5 minutes he had to rest for 10 minutes. He relates having similar episodes of shortness of breath that progressively worsened before his CABG in 12/2017. He denies any chest pain at this time. States that he did not have chest in the past until the night before he was scheduled for CABG.   He denied any acute distress at the time of the call and his speech and tone were normal during our conversation. He was at home at the time of the call and his daughter was there with him.   He is treated by Dr Harrington Challenger and Richardson Dopp, PA-C with [facility]. His last visit with them was 03/17/18. He did have labs ordered by Dr Livia Snellen in our office last week and states that they were recommended by cardiologist because of palpitations and dizziness, but that he did not want to go to their office in [place] because of [place].   09/28/2018 BNP 147.3  I recommended that he contact his cardiologist. They may be able to schedule a telephone visit with his provider if necessary and appropropriate and that would keep him form having to go to [facility] for an appointment. The embedded [facility] office here at [facility] can draw labs if they are needed. We will remain available as needed for PCP services.  Patient stated understanding and agreement to plan. He will contact Dr Alan Ripper office today.   Chong Sicilian, RN-BC, BSN Nurse Case Manager [place] [phone]

## 2018-10-06 ENCOUNTER — Other Ambulatory Visit: Payer: Self-pay | Admitting: Family Medicine

## 2018-10-17 ENCOUNTER — Telehealth: Payer: Self-pay | Admitting: Internal Medicine

## 2018-10-17 ENCOUNTER — Ambulatory Visit (INDEPENDENT_AMBULATORY_CARE_PROVIDER_SITE_OTHER): Payer: PPO | Admitting: Family Medicine

## 2018-10-17 ENCOUNTER — Other Ambulatory Visit: Payer: Self-pay

## 2018-10-17 ENCOUNTER — Encounter: Payer: Self-pay | Admitting: Family Medicine

## 2018-10-17 DIAGNOSIS — I25118 Atherosclerotic heart disease of native coronary artery with other forms of angina pectoris: Secondary | ICD-10-CM

## 2018-10-17 DIAGNOSIS — Z951 Presence of aortocoronary bypass graft: Secondary | ICD-10-CM

## 2018-10-17 MED ORDER — CEFUROXIME AXETIL 250 MG PO TABS: 250.0000 mg | ORAL_TABLET | Freq: Two times a day (BID) | ORAL | 0 refills | Status: AC

## 2018-10-17 MED ORDER — METHYLPREDNISOLONE 8 MG PO TABS: ORAL_TABLET | ORAL | 0 refills | Status: DC

## 2018-10-17 NOTE — Telephone Encounter (Signed)
° ° ° °*  STAT* If patient is at the pharmacy, call can be transferred to refill team.   1. Which medications need to be refilled? (please list name of each medication and dose if known) atenolol (TENORMIN) 25 MG tablet  2. Which pharmacy/location (including street and city if local pharmacy) is medication to be sent to? [id number] - [place], [place] - [address]  3. Do they need a 30 day or 90 day supply? [place]

## 2018-10-17 NOTE — Telephone Encounter (Signed)
Yes it is safe. Richardson Dopp, PA-C    10/17/2018 4:05 PM

## 2018-10-17 NOTE — Telephone Encounter (Signed)
° ° °  Patient calling, would like to know if it is safe to take Methylprednisolone and  cefuroxime that was prescribed by PCP for sinus infection with heart medications

## 2018-10-17 NOTE — Progress Notes (Signed)
Subjective:    Patient ID: Ronald Morrow., Ronald Russell    DOB: Oct 11, 1934, 84 y.o.   MRN: [id number]   HPI: Ronald Debord. is a 84 y.o. Ronald Russell presenting for cough with phlegm. Coughing up every couple of hours. Gets lightheaded with cough. 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 Legs give out. Feels weak. Also had hard breathing. Onset June 2019, after heart surgery. 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 . Using cetirizine and mucinex to help relieve and has prevented chest symptoms.   Depression screen [facility] 2/9 09/28/2018 05/29/2018 03/15/2018 02/17/2018 01/07/2018  Decreased Interest 0 0 0 0 0  Down, Depressed, Hopeless 0 0 0 0 0  PHQ - 2 Score 0 0 0 0 0  Altered sleeping 0 - - - -  Tired, decreased energy 0 - - - -  Change in appetite 0 - - - -  Feeling bad or failure about yourself  0 - - - -  Trouble concentrating 0 - - - -  Moving slowly or fidgety/restless 0 - - - -  Suicidal thoughts 0 - - - -  PHQ-9 Score 0 - - - -  Difficult doing work/chores Not difficult at all - - - -  Some recent data might be hidden     Relevant past medical, surgical, family and social history reviewed and updated as indicated.  Interim medical history since our last visit reviewed. Allergies and medications reviewed and updated.  ROS:  Review of Systems  Constitutional: Negative for activity change, appetite change, chills and fever.  HENT: Positive for congestion, postnasal drip, rhinorrhea and sinus pressure. Negative for ear discharge, ear pain, hearing loss, nosebleeds, sneezing and trouble swallowing.   Respiratory: Negative for chest tightness and shortness of breath.   Cardiovascular: Negative for chest pain and palpitations.  Skin: Negative for rash.     Social History   Tobacco Use  Smoking Status Former Smoker  . Packs/day: 0.75  . Years: 3.00  . Pack years: 2.25  . Types: Cigarettes   Smokeless Tobacco Never Used  Tobacco Comment   02/13/2013 "quit smoking age 84"       Objective:     Wt Readings from Last 3 Encounters:  09/28/18 205 lb (93 kg)  07/07/18 201 lb 12.8 oz (91.5 kg)  05/29/18 200 lb (90.7 kg)     Exam deferred. Pt. Harboring due to COVID 19. Phone visit performed.   Assessment & Plan:   1. S/P CABG x 3   2. Coronary artery disease of native artery of native heart with stable angina pectoris ([place])     Meds ordered this encounter  Medications  . cefUROXime (CEFTIN) 250 MG tablet    Sig: Take 1 tablet (250 mg total) by mouth 2 (two) times daily with a meal for 10 days.    Dispense:  20 tablet    Refill:  0  . methylPREDNISolone (MEDROL) 8 MG tablet    Sig: 4 daily for 2 days, then 3 daily for 2 days, then 2 daily for 2 days, then 1 daily for 2 days    Dispense:  20 tablet    Refill:  0    No orders of the defined types were placed in this encounter.     Diagnoses and all orders for this visit:  S/P CABG x 3  Coronary artery disease of  native artery of native heart with stable angina pectoris ([place])  Other orders -     cefUROXime (CEFTIN) 250 MG tablet; Take 1 tablet (250 mg total) by mouth 2 (two) times daily with a meal for 10 days. -     methylPREDNISolone (MEDROL) 8 MG tablet; 4 daily for 2 days, then 3 daily for 2 days, then 2 daily for 2 days, then 1 daily for 2 days    Virtual Visit via telephone Note  I discussed the limitations, risks, security and privacy concerns of performing an evaluation and management service by telephone and the availability of in person appointments. The patient was identified with two identifiers. Pt.expressed understanding and agreed to proceed. Pt. Is at home. Dr. Livia Snellen is in his office.  Follow Up Instructions:   I discussed the assessment and treatment plan with the patient. The patient was provided an opportunity to ask questions and all were answered. The patient agreed with the plan and  demonstrated an understanding of the instructions.   The patient was advised to call back or seek an in-person evaluation if the symptoms worsen or if the condition fails to improve as anticipated.   Total minutes including chart review and phone contact time: 40   Follow up plan: Return in about 6 weeks (around 11/28/2018).  Claretta Fraise, MD [place]

## 2018-10-18 ENCOUNTER — Telehealth: Payer: Self-pay | Admitting: *Deleted

## 2018-10-18 MED ORDER — ATENOLOL 25 MG PO TABS: 12.5000 mg | ORAL_TABLET | ORAL | 2 refills | Status: DC | PRN

## 2018-10-18 NOTE — Telephone Encounter (Signed)
Virtual Visit Pre-Appointment Phone Call  "(Name), I am calling you today to discuss your upcoming appointment. We are currently trying to limit exposure to the virus that causes COVID-19 by seeing patients at home rather than in the office."  1. "What is the BEST phone number to call the day of the visit?" - include this in appointment notes  2. "Do you have or have access to (through a family member/friend) a smartphone with video capability that we can use for your visit?" a. If yes - list this number in appt notes as "cell" (if different from BEST phone #) and list the appointment type as a VIDEO visit in appointment notes b. If no - list the appointment type as a PHONE visit in appointment notes  3. Confirm consent - "In the setting of the current Covid19 crisis, you are scheduled for a PHONE visit with your provider on 10/20/18 at 8:00 am.  Just as we do with many in-office visits, in order for you to participate in this visit, we must obtain consent.  If you'd like, I can send this to your mychart (if signed up) or email for you to review.  Otherwise, I can obtain your verbal consent now.  All virtual visits are billed to your insurance company just like a normal visit would be.  By agreeing to a virtual visit, we'd like you to understand that the technology does not allow for your provider to perform an examination, and thus may limit your provider's ability to fully assess your condition. If your provider identifies any concerns that need to be evaluated in person, we will make arrangements to do so.  Finally, though the technology is pretty good, we cannot assure that it will always work on either your or our end, and in the setting of a video visit, we may have to convert it to a phone-only visit.  In either situation, we cannot ensure that we have a secure connection.  Are you willing to proceed?" STAFF: Did the patient verbally acknowledge consent to telehealth visit? Document YES/NO here:  YES  4. Advise patient to be prepared - "Two hours prior to your appointment, go ahead and check your blood pressure, pulse, oxygen saturation, and your weight (if you have the equipment to check those) and write them all down. When your visit starts, your provider will ask you for this information. If you have an Apple Watch or Kardia device, please plan to have heart rate information ready on the day of your appointment. Please have a pen and paper handy nearby the day of the visit as well."  5. Give patient instructions for MyChart download to smartphone OR Doximity/Doxy.me as below if video visit (depending on what platform provider is using)  6. Inform patient they will receive a phone call 15 minutes prior to their appointment time (may be from unknown caller ID) so they should be prepared to answer    [place] Sr. has been deemed a candidate for a follow-up tele-health visit to limit community exposure during the Covid-19 pandemic. I spoke with the patient via phone to ensure availability of phone/video source, confirm preferred email & phone number, and discuss instructions and expectations.  I reminded CHIBUIKE FLEEK Sr. to be prepared with any vital sign and/or heart rhythm information that could potentially be obtained via home monitoring, at the time of his visit. I reminded [name] to expect a phone call  prior to his visit.  Rodman Key, RN 10/18/2018 12:20 PM   INSTRUCTIONS FOR DOWNLOADING THE MYCHART APP TO SMARTPHONE  - The patient must first make sure to have activated MyChart and know their login information - If Apple, go to [employer] and type in MyChart in the search bar and download the app. If Android, ask patient to go to [employer] and type in [place] in the search bar and download the app. The app is free but as with any other app downloads, their phone may require them to verify saved payment information or Apple/Android  password.  - The patient will need to then log into the app with their MyChart username and password, and select [facility] as their healthcare provider to link the account. When it is time for your visit, go to the MyChart app, find appointments, and click Begin Video Visit. Be sure to Select Allow for your device to access the Microphone and Camera for your visit. You will then be connected, and your provider will be with you shortly.  **If they have any issues connecting, or need assistance please contact MyChart service desk [phone])**  **If using a computer, in order to ensure the best quality for their visit they will need to use either of the following Internet Browsers: [employer], or Google Chrome**  IF USING DOXIMITY or DOXY.ME - The patient will receive a link just prior to their visit by text.     FULL LENGTH CONSENT FOR TELE-HEALTH VISIT   I hereby voluntarily request, consent and authorize [facility] and its employed or contracted physicians, physician assistants, nurse practitioners or other licensed health care professionals (the Practitioner), to provide me with telemedicine health care services (the "Services") as deemed necessary by the treating Practitioner. I acknowledge and consent to receive the Services by the Practitioner via telemedicine. I understand that the telemedicine visit will involve communicating with the Practitioner through live audiovisual communication technology and the disclosure of certain medical information by electronic transmission. I acknowledge that I have been given the opportunity to request an in-person assessment or other available alternative prior to the telemedicine visit and am voluntarily participating in the telemedicine visit.  I understand that I have the right to withhold or withdraw my consent to the use of telemedicine in the course of my care at any time, without affecting my right to future care or treatment,  and that the Practitioner or I may terminate the telemedicine visit at any time. I understand that I have the right to inspect all information obtained and/or recorded in the course of the telemedicine visit and may receive copies of available information for a reasonable fee.  I understand that some of the potential risks of receiving the Services via telemedicine include:  Marland Kitchen Delay or interruption in medical evaluation due to technological equipment failure or disruption; . Information transmitted may not be sufficient (e.g. poor resolution of images) to allow for appropriate medical decision making by the Practitioner; and/or  . In rare instances, security protocols could fail, causing a breach of personal health information.  Furthermore, I acknowledge that it is my responsibility to provide information about my medical history, conditions and care that is complete and accurate to the best of my ability. I acknowledge that Practitioner's advice, recommendations, and/or decision may be based on factors not within their control, such as incomplete or inaccurate data provided by me or distortions of diagnostic images or specimens that may result from electronic  transmissions. I understand that the practice of medicine is not an exact science and that Practitioner makes no warranties or guarantees regarding treatment outcomes. I acknowledge that I will receive a copy of this consent concurrently upon execution via email to the email address I last provided but may also request a printed copy by calling the office of [place].    I understand that my insurance will be billed for this visit.   I have read or had this consent read to me. . I understand the contents of this consent, which adequately explains the benefits and risks of the Services being provided via telemedicine.  . I have been provided ample opportunity to ask questions regarding this consent and the Services and have had my questions  answered to my satisfaction. . I give my informed consent for the services to be provided through the use of telemedicine in my medical care  By participating in this telemedicine visit I agree to the above.

## 2018-10-18 NOTE — Telephone Encounter (Signed)
Called pt to see exactly how he was taking his medication and pt stated that he not only called for a refill but he was having problems with SOB, he had a heart test and it was high and that he was coughing up phlegm and the doctor told him to contact Dr. Harrington Challenger. Pt would like a call back as soon as possible. Please address

## 2018-10-18 NOTE — Telephone Encounter (Signed)
PT AWARE MEDICATION SAFE FOR SINUS INFECTION

## 2018-10-18 NOTE — Telephone Encounter (Signed)
VIRTUAL --PHONE Pt does not have smartphone.

## 2018-10-18 NOTE — Telephone Encounter (Signed)
Patient saw PCP yesterday and was prescribed antibiotic and steroids. -- wants to know if this is ok with Dr Harrington Challenger.  He is worried it will make his heart skip worse. He takes atenolol 1/2 tab and extra when needed for when skips are worse.   Also-he wants to discuss his SOB with Dr. Harrington Challenger.  This is not new.  Has had since CABG.  He wants to discuss further with Dr. Harrington Challenger.  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  Has to sit and rest.  This is not new.   Adv to follow Dr. Livia Snellen recommendations and start antibiotic and steroid. Scheduled virtual (phone) visit with Dr. Harrington Challenger for 10/20/18.

## 2018-10-20 ENCOUNTER — Other Ambulatory Visit: Payer: Self-pay

## 2018-10-20 ENCOUNTER — Encounter: Payer: Self-pay | Admitting: Internal Medicine

## 2018-10-20 ENCOUNTER — Telehealth (INDEPENDENT_AMBULATORY_CARE_PROVIDER_SITE_OTHER): Payer: PPO | Admitting: Internal Medicine

## 2018-10-20 VITALS — BP 126/67 | HR 61 | Ht 72.0 in | Wt 195.0 lb

## 2018-10-20 DIAGNOSIS — I2510 Atherosclerotic heart disease of native coronary artery without angina pectoris: Secondary | ICD-10-CM | POA: Diagnosis not present

## 2018-10-20 DIAGNOSIS — R0602 Shortness of breath: Secondary | ICD-10-CM

## 2018-10-20 DIAGNOSIS — E785 Hyperlipidemia, unspecified: Secondary | ICD-10-CM

## 2018-10-20 DIAGNOSIS — Z8551 Personal history of malignant neoplasm of bladder: Secondary | ICD-10-CM | POA: Diagnosis not present

## 2018-10-20 DIAGNOSIS — R002 Palpitations: Secondary | ICD-10-CM

## 2018-10-20 MED ORDER — FUROSEMIDE 40 MG PO TABS: ORAL_TABLET | ORAL | 3 refills | Status: DC

## 2018-10-20 NOTE — Patient Instructions (Signed)
Medication Instructions:  Your physician has recommended you make the following change in your medication:  1.) furosemide (Lasix) 40 mg - take one tablet two times a week   If you need a refill on your cardiac medications before your next appointment, please call your pharmacy.   Lab work: In about 2 weeks (same day as echocardiogram) --BMET, BNP, LIPIDS If you have labs (blood work) drawn today and your tests are completely normal, you will receive your results only by: Marland Kitchen MyChart Message (if you have MyChart) OR . A paper copy in the mail If you have any lab test that is abnormal or we need to change your treatment, we will call you to review the results.  Testing/Procedures:   ECHO IN 2 WEEKS Your physician has requested that you have an echocardiogram. Echocardiography is a painless test that uses sound waves to create images of your heart. It provides your doctor with information about the size and shape of your heart and how well your heart's chambers and valves are working. This procedure takes approximately one hour. There are no restrictions for this procedure.   Follow-Up: Your physician recommends that you schedule a follow-up appointment in: in 1-3 months.  We will contact you to schedule this appointment.  Any Other Special Instructions Will Be Listed Below (If Applicable).
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
## 2018-10-26 DIAGNOSIS — H9202 Otalgia, left ear: Secondary | ICD-10-CM | POA: Diagnosis not present

## 2018-10-26 DIAGNOSIS — R42 Dizziness and giddiness: Secondary | ICD-10-CM | POA: Diagnosis not present

## 2018-10-28 ENCOUNTER — Other Ambulatory Visit: Payer: Self-pay | Admitting: Family Medicine

## 2018-10-30 NOTE — Telephone Encounter (Signed)
Medication is not on patient's current med list

## 2018-11-02 ENCOUNTER — Ambulatory Visit (INDEPENDENT_AMBULATORY_CARE_PROVIDER_SITE_OTHER): Payer: PPO | Admitting: *Deleted

## 2018-11-02 ENCOUNTER — Other Ambulatory Visit: Payer: Self-pay

## 2018-11-02 ENCOUNTER — Ambulatory Visit (INDEPENDENT_AMBULATORY_CARE_PROVIDER_SITE_OTHER): Payer: PPO | Admitting: Family

## 2018-11-02 ENCOUNTER — Encounter: Payer: Self-pay | Admitting: Family

## 2018-11-02 DIAGNOSIS — J0111 Acute recurrent frontal sinusitis: Secondary | ICD-10-CM | POA: Diagnosis not present

## 2018-11-02 DIAGNOSIS — Z0000 Encounter for general adult medical examination without abnormal findings: Secondary | ICD-10-CM | POA: Diagnosis not present

## 2018-11-02 MED ORDER — AZITHROMYCIN 250 MG PO TABS: ORAL_TABLET | ORAL | 0 refills | Status: DC
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
## 2018-11-02 NOTE — Progress Notes (Signed)
   Virtual Visit via telephone Note  I connected with Ronald Russell. on 11/02/18 at 4::45 pm by telephone and verified that I am speaking with the correct person using two identifiers. Ronald Russell. is currently located at home and no one is currently with her during visit. The provider, Evelina Dun, FNP is located in their office at time of visit.  I discussed the limitations, risks, security and privacy concerns of performing an evaluation and management service by telephone and the availability of in person appointments. I also discussed with the patient that there may be a patient responsible charge related to this service. The patient expressed understanding and agreed to proceed.   History and Present Illness:  Pt calls the office today with recurrent sinusitis. He was seen on 10/17/18 by his PCP and given Cefuroxime and steroid. He states this made him sick and diarrhea. He then saw a provider on 10/23/18 who gave him his a zpak. He states this helped, but now still feels swimming headed.  He saw his ENT on 10/16/18 and stated his ear and sinus problems looked ok.  Sinusitis  This is a recurrent problem. The current episode started 1 to 4 weeks ago. The problem has been waxing and waning since onset. There has been no fever. His pain is at a severity of 7/10. The pain is mild. Associated symptoms include congestion, coughing, ear pain, headaches, sinus pressure, sneezing and a sore throat. (Dizziness) Past treatments include antibiotics and spray decongestants. The treatment provided mild relief.      Review of Systems  HENT: Positive for congestion, ear pain, sinus pressure, sneezing and sore throat.   Respiratory: Positive for cough.   Neurological: Positive for headaches.  All other systems reviewed and are negative.    Observations/Objective: No SOB or distress  Assessment and Plan: 1. Acute recurrent frontal sinusitis Continue mucinex and flonase daily   Keep  ENT appt - Take meds as prescribed - Use a cool mist humidifier  -Use saline nose sprays frequently -Force fluids -For any cough or congestion  Use plain Mucinex- regular strength or max strength is fine -For fever or aces or pains- take tylenol or ibuprofen. -Throat lozenges if help -RTO if symptoms worsen or do not improve  - azithromycin (ZITHROMAX) 250 MG tablet; Take 500 mg once, then 250 mg for four days  Dispense: 6 tablet; Refill: 0     I discussed the assessment and treatment plan with the patient. The patient was provided an opportunity to ask questions and all were answered. The patient agreed with the plan and demonstrated an understanding of the instructions.   The patient was advised to call back or seek an in-person evaluation if the symptoms worsen or if the condition fails to improve as anticipated.  The above assessment and management plan was discussed with the patient. The patient verbalized understanding of and has agreed to the management plan. Patient is aware to call the clinic if symptoms persist or worsen. Patient is aware when to return to the clinic for a follow-up visit. Patient educated on when it is appropriate to go to the emergency department.   Time call ended:  5:00 pm  I provided 15 minutes of non-face-to-face time during this encounter.    Evelina Dun, FNP

## 2018-11-02 NOTE — Patient Instructions (Signed)
Preventive Care 2 Years and Older, Male Preventive care refers to lifestyle choices and visits with your health care provider that can promote health and wellness. What does preventive care include?   A yearly physical exam. This is also called an annual well check.  Dental exams once or twice a year.  Routine eye exams. Ask your health care provider how often you should have your eyes checked.  Personal lifestyle choices, including: ? Daily care of your teeth and gums. ? Regular physical activity. ? Eating a healthy diet. ? Avoiding tobacco and drug use. ? Limiting alcohol use. ? Practicing safe sex. ? Taking low doses of aspirin every day. ? Taking vitamin and mineral supplements as recommended by your health care provider. What happens during an annual well check? The services and screenings done by your health care provider during your annual well check will depend on your age, overall health, lifestyle risk factors, and family history of disease. Counseling Your health care provider may ask you questions about your:  Alcohol use.  Tobacco use.  Drug use.  Emotional well-being.  Home and relationship well-being.  Sexual activity.  Eating habits.  History of falls.  Memory and ability to understand (cognition).  Work and work Statistician. Screening You may have the following tests or measurements:  Height, weight, and BMI.  Blood pressure.  Lipid and cholesterol levels. These may be checked every 5 years, or more frequently if you are over 9 years old.  Skin check.  Lung cancer screening. You may have this screening every year starting at age 57 if you have a 30-pack-year history of smoking and currently smoke or have quit within the past 15 years.  Colorectal cancer screening. All adults should have this screening starting at age 90 and continuing until age 69. You will have tests every 1-10 years, depending on your results and the type of screening  test. People at increased risk should start screening at an earlier age. Screening tests may include: ? Guaiac-based fecal occult blood testing. ? Fecal immunochemical test (FIT). ? Stool DNA test. ? Virtual colonoscopy. ? Sigmoidoscopy. During this test, a flexible tube with a tiny camera (sigmoidoscope) is used to examine your rectum and lower colon. The sigmoidoscope is inserted through your anus into your rectum and lower colon. ? Colonoscopy. During this test, a long, thin, flexible tube with a tiny camera (colonoscope) is used to examine your entire colon and rectum.  Prostate cancer screening. Recommendations will vary depending on your family history and other risks.  Hepatitis C blood test.  Hepatitis B blood test.  Sexually transmitted disease (STD) testing.  Diabetes screening. This is done by checking your blood sugar (glucose) after you have not eaten for a while (fasting). You may have this done every 1-3 years.  Abdominal aortic aneurysm (AAA) screening. You may need this if you are a current or former smoker.  Osteoporosis. You may be screened starting at age 30 if you are at high risk. Talk with your health care provider about your test results, treatment options, and if necessary, the need for more tests. Vaccines Your health care provider may recommend certain vaccines, such as:  Influenza vaccine. This is recommended every year.  Tetanus, diphtheria, and acellular pertussis (Tdap, Td) vaccine. You may need a Td booster every 10 years.  Varicella vaccine. You may need this if you have not been vaccinated.  Zoster vaccine. You may need this after age 42.  Measles, mumps, and rubella (MMR) vaccine.  You may need at least one dose of MMR if you were born in 1957 or later. You may also need a second dose.  Pneumococcal 13-valent conjugate (PCV13) vaccine. One dose is recommended after age 65.  Pneumococcal polysaccharide (PPSV23) vaccine. One dose is recommended  after age 65.  Meningococcal vaccine. You may need this if you have certain conditions.  Hepatitis A vaccine. You may need this if you have certain conditions or if you travel or work in places where you may be exposed to hepatitis A.  Hepatitis B vaccine. You may need this if you have certain conditions or if you travel or work in places where you may be exposed to hepatitis B.  Haemophilus influenzae type b (Hib) vaccine. You may need this if you have certain risk factors. Talk to your health care provider about which screenings and vaccines you need and how often you need them. This information is not intended to replace advice given to you by your health care provider. Make sure you discuss any questions you have with your health care provider. Document Released: 06/27/2015 Document Revised: 07/21/2017 Document Reviewed: 04/01/2015 Elsevier Interactive Patient Education  2019 Elsevier Inc.  

## 2018-11-07 ENCOUNTER — Encounter: Payer: Self-pay | Admitting: Family Medicine

## 2018-11-07 ENCOUNTER — Other Ambulatory Visit: Payer: Self-pay

## 2018-11-07 ENCOUNTER — Ambulatory Visit (INDEPENDENT_AMBULATORY_CARE_PROVIDER_SITE_OTHER): Payer: PPO | Admitting: Family Medicine

## 2018-11-07 DIAGNOSIS — R42 Dizziness and giddiness: Secondary | ICD-10-CM

## 2018-11-07 DIAGNOSIS — J322 Chronic ethmoidal sinusitis: Secondary | ICD-10-CM | POA: Diagnosis not present

## 2018-11-07 MED ORDER — AZITHROMYCIN 250 MG PO TABS: ORAL_TABLET | ORAL | 0 refills | Status: DC

## 2018-11-07 MED ORDER — METHYLPREDNISOLONE 4 MG PO TBPK: ORAL_TABLET | ORAL | 0 refills | Status: DC

## 2018-11-07 NOTE — Progress Notes (Signed)
Subjective:    Patient ID: Ronald Morrow., male    DOB: 07-05-1934, 84 y.o.   MRN: [id number]   HPI: Ronald Guthrie. is a 84 y.o. male presenting for dizziness unchanged since llast visit. Couldn't tolerate the antibiotic. Switched to [employer]. Has had 2 packs. Now just a little swimmy headed. Afraid of falling. Had mucosal thickening in ethmoids on CT. ENT told him that it was insignificant. Pt. Cannot tolerate prednisone since getting shingles three years ago. However, feels it has worked best in the past for this. Denies fever, congestion. Feels good other than the off-balance feeling.   Depression screen [facility] 2/9 11/02/2018 09/28/2018 05/29/2018 03/15/2018 02/17/2018  Decreased Interest 0 0 0 0 0  Down, Depressed, Hopeless 0 0 0 0 0  PHQ - 2 Score 0 0 0 0 0  Altered sleeping - 0 - - -  Tired, decreased energy - 0 - - -  Change in appetite - 0 - - -  Feeling bad or failure about yourself  - 0 - - -  Trouble concentrating - 0 - - -  Moving slowly or fidgety/restless - 0 - - -  Suicidal thoughts - 0 - - -  PHQ-9 Score - 0 - - -  Difficult doing work/chores - Not difficult at all - - -  Some recent data might be hidden     Relevant past medical, surgical, family and social history reviewed and updated as indicated.  Interim medical history since our last visit reviewed. Allergies and medications reviewed and updated.  ROS:  Review of Systems  Constitutional: Negative for fever.  HENT: Negative for congestion, ear pain, hearing loss, postnasal drip, rhinorrhea, sinus pressure and sore throat.   Respiratory: Negative for shortness of breath.   Cardiovascular: Negative for chest pain.  Musculoskeletal: Negative for arthralgias.  Skin: Negative for rash.  Neurological: Positive for dizziness and light-headedness. Negative for syncope, speech difficulty and weakness.     Social History   Tobacco Use  Smoking Status Former Smoker  . Packs/day: 0.75  . Years: 3.00  . Pack  years: 2.25  . Types: Cigarettes  Smokeless Tobacco Never Used  Tobacco Comment   02/13/2013 "quit smoking age 26"       Objective:     Wt Readings from Last 3 Encounters:  10/20/18 195 lb (88.5 kg)  09/28/18 205 lb (93 kg)  07/07/18 201 lb 12.8 oz (91.5 kg)     Exam deferred. Pt. Harboring due to COVID 19. Phone visit performed.   Assessment & Plan:   1. Dizziness   2. Chronic ethmoidal sinusitis     Meds ordered this encounter  Medications  . azithromycin (ZITHROMAX) 250 MG tablet    Sig: Take 500 mg once, then 250 mg for four days    Dispense:  6 tablet    Refill:  0  . methylPREDNISolone (MEDROL DOSEPAK) 4 MG TBPK tablet    Sig: Start with 6 on the first day and take one less each day until finished    Dispense:  21 tablet    Refill:  0    No orders of the defined types were placed in this encounter.     Diagnoses and all orders for this visit:  Dizziness  Chronic ethmoidal sinusitis -     azithromycin (ZITHROMAX) 250 MG tablet; Take 500 mg once, then 250 mg for four days  Other orders -     methylPREDNISolone (MEDROL DOSEPAK) 4  MG TBPK tablet; Start with 6 on the first day and take one less each day until finished    Virtual Visit via telephone Note  I discussed the limitations, risks, security and privacy concerns of performing an evaluation and management service by telephone and the availability of in person appointments. The patient was identified with two identifiers. Pt.expressed understanding and agreed to proceed. Pt. Is at home. Dr. Livia Snellen is in his office.  Follow Up Instructions:   I discussed the assessment and treatment plan with the patient. The patient was provided an opportunity to ask questions and all were answered. The patient agreed with the plan and demonstrated an understanding of the instructions.   The patient was advised to call back or seek an in-person evaluation if the symptoms worsen or if the condition fails to improve as  anticipated.   Total minutes including chart review and phone contact time: 25   Follow up plan: Return in about 2 weeks (around 11/21/2018).  Claretta Fraise, MD [place]

## 2018-11-08 ENCOUNTER — Telehealth: Payer: Self-pay | Admitting: Internal Medicine

## 2018-11-08 NOTE — Telephone Encounter (Signed)
New Message:    Pt says he needs to take Prednisone> he says he needs to talk to about how he needs to take it with his condition ann his other medicine.

## 2018-11-08 NOTE — Telephone Encounter (Signed)
Was told by Dr. Harrington Challenger to hold off on prednisone and try to fluid pill first.  Worked well but didn't dry up sinuses.  Has had 2 courses of z pack but his symptoms on minimize (swimmy headedness)  He is now about to start 3rd z pack and the course of steriods.   Called PMD back and he wants him to take the prednisone.   Pt  is concerned that it might make his heart skip more.  It will be a medrol dose pack.   He discussed concern with PCP who stated this is a different steroid than what he took the last time.  IT may be a lower dose but he doesn't know what it is. I adv him to pick up medication and begin this since his PCP is the one trying to get rid of this sinus infection.  He "really has confidence in Dr. Harrington Challenger"  and would like her opinion.  He is going to wait until he hears back from [place] before he starts this.

## 2018-11-09 NOTE — Telephone Encounter (Signed)
Called patient back with Dr. Alan Ripper response. Patient verbalized understanding.

## 2018-11-09 NOTE — Telephone Encounter (Signed)
Should be OK to take steroid dose pack (it is a taper)  Please call pt

## 2018-11-14 ENCOUNTER — Other Ambulatory Visit: Payer: Self-pay

## 2018-11-14 ENCOUNTER — Ambulatory Visit (INDEPENDENT_AMBULATORY_CARE_PROVIDER_SITE_OTHER): Payer: PPO | Admitting: Family Medicine

## 2018-11-14 ENCOUNTER — Encounter: Payer: Self-pay | Admitting: Family Medicine

## 2018-11-14 DIAGNOSIS — J322 Chronic ethmoidal sinusitis: Secondary | ICD-10-CM

## 2018-11-14 DIAGNOSIS — J0111 Acute recurrent frontal sinusitis: Secondary | ICD-10-CM | POA: Diagnosis not present

## 2018-11-14 MED ORDER — PSEUDOEPHEDRINE-GUAIFENESIN ER 60-600 MG PO TB12: ORAL_TABLET | ORAL | 0 refills | Status: DC

## 2018-11-14 NOTE — Progress Notes (Signed)
Subjective:    Patient ID: Ronald Morrow., male    DOB: 1934/07/28, 84 y.o.   MRN: [id number]   HPI: Ronald Sumler. is a 84 y.o. male presenting for Symptoms include congestion, facial pain, nasal congestion, yellow phlegm, scantly productive cough, post nasal drip and sinus pressure. Drawing at [employer]. L>R. There is no fever, chills, or sweats. Onset of symptoms was 4-5 days ago, gradually worsening since that time. Feels light headedness was better, starting to come back over the last few hours. Wants to go to [place], [place] tomorrow.   Depression screen [facility] 2/9 11/02/2018 09/28/2018 05/29/2018 03/15/2018 02/17/2018  Decreased Interest 0 0 0 0 0  Down, Depressed, Hopeless 0 0 0 0 0  PHQ - 2 Score 0 0 0 0 0  Altered sleeping - 0 - - -  Tired, decreased energy - 0 - - -  Change in appetite - 0 - - -  Feeling bad or failure about yourself  - 0 - - -  Trouble concentrating - 0 - - -  Moving slowly or fidgety/restless - 0 - - -  Suicidal thoughts - 0 - - -  PHQ-9 Score - 0 - - -  Difficult doing work/chores - Not difficult at all - - -  Some recent data might be hidden     Relevant past medical, surgical, family and social history reviewed and updated as indicated.  Interim medical history since our last visit reviewed. Allergies and medications reviewed and updated.  ROS:  Review of Systems  Constitutional: Negative for activity change, appetite change, chills and fever.  HENT: Positive for congestion, postnasal drip, rhinorrhea and sinus pressure. Negative for ear discharge, ear pain, hearing loss, nosebleeds, sneezing and trouble swallowing.   Respiratory: Negative for chest tightness and shortness of breath.   Cardiovascular: Negative for chest pain and palpitations.  Skin: Negative for rash.     Social History   Tobacco Use  Smoking Status Former Smoker  . Packs/day: 0.75  . Years: 3.00  . Pack years: 2.25  . Types: Cigarettes  Smokeless Tobacco Never Used   Tobacco Comment   02/13/2013 "quit smoking age 27"       Objective:     Wt Readings from Last 3 Encounters:  10/20/18 195 lb (88.5 kg)  09/28/18 205 lb (93 kg)  07/07/18 201 lb 12.8 oz (91.5 kg)     Exam deferred. Pt. Harboring due to COVID 19. Phone visit performed.   Assessment & Plan:  No diagnosis found.  Meds ordered this encounter  Medications  . pseudoephedrine-guaifenesin (MUCINEX D) 60-600 MG 12 hr tablet    Sig: Take 1/2 twice daily as needed for congestion    Dispense:  20 tablet    Refill:  0    No orders of the defined types were placed in this encounter.     There are no diagnoses linked to this encounter.  Virtual Visit via telephone Note  I discussed the limitations, risks, security and privacy concerns of performing an evaluation and management service by telephone and the availability of in person appointments. The patient was identified with two identifiers. Pt.expressed understanding and agreed to proceed. Pt. Is at home. Dr. Livia Snellen is in his office.  Follow Up Instructions:   I discussed the assessment and treatment plan with the patient. The patient was provided an opportunity to ask questions and all were answered. The patient agreed with the plan and demonstrated an understanding  of the instructions.   The patient was advised to call back or seek an in-person evaluation if the symptoms worsen or if the condition fails to improve as anticipated.   Total minutes including chart review and phone contact time: 18   Follow up plan: Return if symptoms worsen or fail to improve.  Ronald Fraise, MD [place]

## 2018-11-20 ENCOUNTER — Telehealth: Payer: Self-pay | Admitting: Family Medicine

## 2018-11-20 NOTE — Telephone Encounter (Signed)
Patient aware.

## 2018-11-20 NOTE — Telephone Encounter (Signed)
Patient [place] and is [place] nauseous.

## 2018-11-20 NOTE — Telephone Encounter (Signed)
He was only given mucinx d according to chart- tell him to change to plain mucinex OTC and see if does better.

## 2018-11-20 NOTE — Telephone Encounter (Signed)
Just tell him to take with food

## 2018-11-23 ENCOUNTER — Encounter: Payer: Self-pay | Admitting: Family Medicine

## 2018-11-23 ENCOUNTER — Telehealth: Payer: Self-pay | Admitting: *Deleted

## 2018-11-23 ENCOUNTER — Ambulatory Visit (INDEPENDENT_AMBULATORY_CARE_PROVIDER_SITE_OTHER): Payer: PPO | Admitting: Family Medicine

## 2018-11-23 DIAGNOSIS — R002 Palpitations: Secondary | ICD-10-CM | POA: Diagnosis not present

## 2018-11-23 DIAGNOSIS — J322 Chronic ethmoidal sinusitis: Secondary | ICD-10-CM | POA: Diagnosis not present

## 2018-11-23 DIAGNOSIS — R42 Dizziness and giddiness: Secondary | ICD-10-CM | POA: Diagnosis not present

## 2018-11-23 MED ORDER — LEVOFLOXACIN 500 MG PO TABS: 500.0000 mg | ORAL_TABLET | Freq: Every day | ORAL | 0 refills | Status: DC

## 2018-11-23 MED ORDER — METHYLPREDNISOLONE 4 MG PO TABS: 4.0000 mg | ORAL_TABLET | Freq: Every day | ORAL | 0 refills | Status: DC

## 2018-11-23 NOTE — Progress Notes (Signed)
Subjective:    Patient ID: Ronald Morrow., male    DOB: [birth year], 83 y.o.   MRN: [id number]   HPI: Ronald Caylor. is a 84 y.o. male presenting for sinuses. Swimmy-headed & feels like eyes, left side of head feels hot. Left side of head is numb. BP running normal. Couldn't tolerate mucinex D due to palpitations.   Symptoms include congestion, facial pain, nasal congestion, post nasal drip and sinus pressure. There is no fever, chills, or sweats. Onset of symptoms was several weeks ago, improving on latest roun of antibiotics but still bringing up ssome colored phlegm.  Depression screen [facility] 2/9 11/02/2018 09/28/2018 05/29/2018 03/15/2018 02/17/2018  Decreased Interest 0 0 0 0 0  Down, Depressed, Hopeless 0 0 0 0 0  PHQ - 2 Score 0 0 0 0 0  Altered sleeping - 0 - - -  Tired, decreased energy - 0 - - -  Change in appetite - 0 - - -  Feeling bad or failure about yourself  - 0 - - -  Trouble concentrating - 0 - - -  Moving slowly or fidgety/restless - 0 - - -  Suicidal thoughts - 0 - - -  PHQ-9 Score - 0 - - -  Difficult doing work/chores - Not difficult at all - - -  Some recent data might be hidden     Relevant past medical, surgical, family and social history reviewed and updated as indicated.  Interim medical history since our last visit reviewed. Allergies and medications reviewed and updated.  ROS:  Review of Systems  Constitutional: Negative for activity change, appetite change, chills and fever.  HENT: Positive for congestion, postnasal drip, rhinorrhea and sinus pressure. Negative for ear discharge, ear pain, hearing loss, nosebleeds, sneezing and trouble swallowing.   Respiratory: Negative for chest tightness and shortness of breath.   Cardiovascular: Negative for chest pain and palpitations.  Skin: Negative for rash.  Neurological: Positive for dizziness (off balance at times. No vertigo.).     Social History   Tobacco Use  Smoking Status Former Smoker  .  Packs/day: 0.75  . Years: 3.00  . Pack years: 2.25  . Types: Cigarettes  Smokeless Tobacco Never Used  Tobacco Comment   02/13/2013 "quit smoking age 44"       Objective:     Wt Readings from Last 3 Encounters:  10/20/18 195 lb (88.5 kg)  09/28/18 205 lb (93 kg)  07/07/18 201 lb 12.8 oz (91.5 kg)     Exam deferred. Pt. Harboring due to COVID 19. Phone visit performed.   Assessment & Plan:   1. Chronic ethmoidal sinusitis   2. Palpitations   3. Dizziness     Meds ordered this encounter  Medications  . methylPREDNISolone (MEDROL) 4 MG tablet    Sig: Take 1 tablet (4 mg total) by mouth daily. After finishing the previously discussed dose for the first week, do this the second week : 4 pills daily for one week, then week three take two pills daily.    Dispense:  42 tablet    Refill:  0  . levofloxacin (LEVAQUIN) 500 MG tablet    Sig: Take 1 tablet (500 mg total) by mouth daily. For 10 days    Dispense:  10 tablet    Refill:  0    No orders of the defined types were placed in this encounter.     Diagnoses and all orders for this visit:  Chronic  ethmoidal sinusitis  Palpitations  Dizziness  Other orders -     methylPREDNISolone (MEDROL) 4 MG tablet; Take 1 tablet (4 mg total) by mouth daily. After finishing the previously discussed dose for the first week, do this the second week : 4 pills daily for one week, then week three take two pills daily. -     levofloxacin (LEVAQUIN) 500 MG tablet; Take 1 tablet (500 mg total) by mouth daily. For 10 days    Virtual Visit via telephone Note  I discussed the limitations, risks, security and privacy concerns of performing an evaluation and management service by telephone and the availability of in person appointments. The patient was identified with two identifiers. Pt.expressed understanding and agreed to proceed. Pt. Is at home. Dr. Livia Snellen is in his office.  Follow Up Instructions:   I discussed the assessment and  treatment plan with the patient. The patient was provided an opportunity to ask questions and all were answered. The patient agreed with the plan and demonstrated an understanding of the instructions.   The patient was advised to call back or seek an in-person evaluation if the symptoms worsen or if the condition fails to improve as anticipated.   Total minutes including chart review and phone contact time: 25   Follow up plan: No follow-ups on file.  Claretta Fraise, MD [place]

## 2018-11-24 ENCOUNTER — Telehealth: Payer: Self-pay | Admitting: Family Medicine

## 2018-11-24 ENCOUNTER — Other Ambulatory Visit: Payer: Self-pay | Admitting: Family Medicine

## 2018-11-24 MED ORDER — CEFUROXIME AXETIL 250 MG PO TABS: 250.0000 mg | ORAL_TABLET | Freq: Two times a day (BID) | ORAL | 0 refills | Status: DC

## 2018-11-24 NOTE — Telephone Encounter (Signed)
Spoke with the pharmacist and advised of MD feedback and they voiced understanding.

## 2018-11-24 NOTE — Telephone Encounter (Signed)
Pharmacy calling again for 2 Rxs from yesterday d/t patient calling them a couple of times and wanting to take something he has at home 1- pt is allergic to levaquin 2-dosing clarification on medrol Rx

## 2018-11-24 NOTE — Telephone Encounter (Signed)
I sent in the cefuroxime. Sorry about the mix up. I thought a trial of levaquin might be worth while since it is stronger.

## 2018-11-24 NOTE — Telephone Encounter (Signed)
Patient states that Dr. Livia Snellen was supposed to send in Cefuroxime 250mg  instead of Levaquin since Levaquin gives him diarrhea. Please advise

## 2018-11-24 NOTE — Telephone Encounter (Signed)
The directions on the label of the medrol  Are based on him having medication at home for the first week. That is why the prescription says for him to start the prescription on the second week. Pharmacist doesn't have to understand that, they should fill it as written. He is not allergic to levaquin. He had diarrhea once with cipro, a similar drug, but one that may not cause diarrhea. He should try it and let me know if he has side effects.

## 2018-11-24 NOTE — Telephone Encounter (Signed)
Pt notified of RX Verbalizes understanding 

## 2018-12-05 ENCOUNTER — Encounter: Payer: Self-pay | Admitting: Family Medicine

## 2018-12-05 ENCOUNTER — Ambulatory Visit (INDEPENDENT_AMBULATORY_CARE_PROVIDER_SITE_OTHER): Payer: PPO | Admitting: Family Medicine

## 2018-12-05 ENCOUNTER — Other Ambulatory Visit: Payer: Self-pay

## 2018-12-05 DIAGNOSIS — J329 Chronic sinusitis, unspecified: Secondary | ICD-10-CM

## 2018-12-05 DIAGNOSIS — R42 Dizziness and giddiness: Secondary | ICD-10-CM | POA: Diagnosis not present

## 2018-12-05 DIAGNOSIS — J40 Bronchitis, not specified as acute or chronic: Secondary | ICD-10-CM

## 2018-12-05 MED ORDER — CEFUROXIME AXETIL 250 MG PO TABS: 250.0000 mg | ORAL_TABLET | Freq: Two times a day (BID) | ORAL | 0 refills | Status: DC

## 2018-12-05 NOTE — Progress Notes (Signed)
Subjective:    Patient ID: Ronald Morrow., male    DOB: 04-23-1935, 84 y.o.   MRN: [id number]   HPI: Ronald Russell. is a 84 y.o. male presenting for improving sinus and cough, but some swimmy-headed feeling returned yesterday and today. Ran out of cefuroxime. Feels another course would finish the illness since he has improved so much.   Depression screen [facility] 2/9 11/02/2018 09/28/2018 05/29/2018 03/15/2018 02/17/2018  Decreased Interest 0 0 0 0 0  Down, Depressed, Hopeless 0 0 0 0 0  PHQ - 2 Score 0 0 0 0 0  Altered sleeping - 0 - - -  Tired, decreased energy - 0 - - -  Change in appetite - 0 - - -  Feeling bad or failure about yourself  - 0 - - -  Trouble concentrating - 0 - - -  Moving slowly or fidgety/restless - 0 - - -  Suicidal thoughts - 0 - - -  PHQ-9 Score - 0 - - -  Difficult doing work/chores - Not difficult at all - - -  Some recent data might be hidden     Relevant past medical, surgical, family and social history reviewed and updated as indicated.  Interim medical history since our last visit reviewed. Allergies and medications reviewed and updated.  ROS:  Review of Systems  Constitutional: Negative for activity change, appetite change, chills and fever.  HENT: Positive for congestion, postnasal drip and sinus pressure. Negative for ear discharge, ear pain, hearing loss, nosebleeds, rhinorrhea, sneezing and trouble swallowing.   Respiratory: Negative for chest tightness and shortness of breath.   Cardiovascular: Negative for chest pain and palpitations.  Skin: Negative for rash.     Social History   Tobacco Use  Smoking Status Former Smoker  . Packs/day: 0.75  . Years: 3.00  . Pack years: 2.25  . Types: Cigarettes  Smokeless Tobacco Never Used  Tobacco Comment   02/13/2013 "quit smoking age 18"       Objective:     Wt Readings from Last 3 Encounters:  10/20/18 195 lb (88.5 kg)  09/28/18 205 lb (93 kg)  07/07/18 201 lb 12.8 oz (91.5 kg)      Exam deferred. Pt. Harboring due to COVID 19. Phone visit performed.   Assessment & Plan:   1. Dizziness   2. Sinobronchitis     Meds ordered this encounter  Medications  . cefUROXime (CEFTIN) 250 MG tablet    Sig: Take 1 tablet (250 mg total) by mouth 2 (two) times daily with a meal for 10 days.    Dispense:  20 tablet    Refill:  0    No orders of the defined types were placed in this encounter.     Diagnoses and all orders for this visit:  Dizziness  Sinobronchitis  Other orders -     cefUROXime (CEFTIN) 250 MG tablet; Take 1 tablet (250 mg total) by mouth 2 (two) times daily with a meal for 10 days.    Virtual Visit via telephone Note  I discussed the limitations, risks, security and privacy concerns of performing an evaluation and management service by telephone and the availability of in person appointments. The patient was identified with two identifiers. Pt.expressed understanding and agreed to proceed. Pt. Is at home. Dr. Livia Snellen is in his office.  Follow Up Instructions:   I discussed the assessment and treatment plan with the patient. The patient was provided an opportunity to  ask questions and all were answered. The patient agreed with the plan and demonstrated an understanding of the instructions.   The patient was advised to call back or seek an in-person evaluation if the symptoms worsen or if the condition fails to improve as anticipated.   Total minutes including chart review and phone contact time: 20   Follow up plan: Return in about 2 weeks (around 12/19/2018).  Claretta Fraise, MD [place]

## 2018-12-07 ENCOUNTER — Telehealth: Payer: Self-pay | Admitting: Internal Medicine

## 2018-12-07 NOTE — Telephone Encounter (Signed)
  Ronald Russell wants to know if his atenolol (TENORMIN) 25 MG tablet can make him dizzy. He has been dizzy for about three months and he wondered if that could be the cause.

## 2018-12-07 NOTE — Telephone Encounter (Signed)
Taking 6th antibiotic for sinus infection and has had several courses of prednisone.  Coughing some of phlegm. Some clear, some discolored.  Top of head is hurting bad.  Numb and has pressure.  Left temple and behind eyes get warm.   Tenderness in left ear.  137/68, 66  Has seen 2 ENT doctors.  Seems to not have gotten any answers. C/o swimmy headed.  Adv unlikely dizziness is related to atenolol.  Reassured BP and HR are good and that the reading he provided would not cause ongoing dizziness.  He has been dizzy all day today since he got up. Has dramamine at home and may try a dose of this.  Recommended he follow up with ENT or primary care for his symptoms.

## 2018-12-15 ENCOUNTER — Telehealth: Payer: Self-pay | Admitting: Family Medicine

## 2018-12-15 ENCOUNTER — Ambulatory Visit (INDEPENDENT_AMBULATORY_CARE_PROVIDER_SITE_OTHER): Payer: PPO | Admitting: Family Medicine

## 2018-12-15 ENCOUNTER — Encounter: Payer: Self-pay | Admitting: Family Medicine

## 2018-12-15 ENCOUNTER — Other Ambulatory Visit: Payer: Self-pay

## 2018-12-15 DIAGNOSIS — J322 Chronic ethmoidal sinusitis: Secondary | ICD-10-CM

## 2018-12-15 DIAGNOSIS — R42 Dizziness and giddiness: Secondary | ICD-10-CM | POA: Diagnosis not present

## 2018-12-15 MED ORDER — METHYLPREDNISOLONE 4 MG PO TABS: 4.0000 mg | ORAL_TABLET | Freq: Every day | ORAL | 0 refills | Status: DC

## 2018-12-15 MED ORDER — CEFUROXIME AXETIL 250 MG PO TABS: 250.0000 mg | ORAL_TABLET | Freq: Two times a day (BID) | ORAL | 0 refills | Status: AC

## 2018-12-15 NOTE — Progress Notes (Signed)
Subjective:    Patient ID: Ronald Morrow., male    DOB: 1934/12/17, 84 y.o.   MRN: [id number]   HPI: Ronald Beckers. is a 84 y.o. male presenting for still getting twinges of discomfort in scalp with disequilibrium. Not frequent. Almost gone, but still occurring occasionally. Red bumps on scalp are gone.   Depression screen [facility] 2/9 11/02/2018 09/28/2018 05/29/2018 03/15/2018 02/17/2018  Decreased Interest 0 0 0 0 0  Down, Depressed, Hopeless 0 0 0 0 0  PHQ - 2 Score 0 0 0 0 0  Altered sleeping - 0 - - -  Tired, decreased energy - 0 - - -  Change in appetite - 0 - - -  Feeling bad or failure about yourself  - 0 - - -  Trouble concentrating - 0 - - -  Moving slowly or fidgety/restless - 0 - - -  Suicidal thoughts - 0 - - -  PHQ-9 Score - 0 - - -  Difficult doing work/chores - Not difficult at all - - -  Some recent data might be hidden     Relevant past medical, surgical, family and social history reviewed and updated as indicated.  Interim medical history since our last visit reviewed. Allergies and medications reviewed and updated.  ROS:  Review of Systems  Constitutional: Negative for fever.  Respiratory: Negative for shortness of breath.   Cardiovascular: Negative for chest pain.  Musculoskeletal: Negative for arthralgias.  Skin: Negative for rash.     Social History   Tobacco Use  Smoking Status Former Smoker  . Packs/day: 0.75  . Years: 3.00  . Pack years: 2.25  . Types: Cigarettes  Smokeless Tobacco Never Used  Tobacco Comment   02/13/2013 "quit smoking age 81"       Objective:     Wt Readings from Last 3 Encounters:  10/20/18 195 lb (88.5 kg)  09/28/18 205 lb (93 kg)  07/07/18 201 lb 12.8 oz (91.5 kg)     Exam deferred. Pt. Harboring due to COVID 19. Phone visit performed.   Assessment & Plan:   1. Dizziness   2. Chronic ethmoidal sinusitis     Meds ordered this encounter  Medications  . cefUROXime (CEFTIN) 250 MG tablet    Sig: Take  1 tablet (250 mg total) by mouth 2 (two) times daily with a meal for 10 days.    Dispense:  20 tablet    Refill:  0  . methylPREDNISolone (MEDROL) 4 MG tablet    Sig: Take 1 tablet (4 mg total) by mouth daily. After finishing the previously discussed dose for the first week, do this the second week : 4 pills daily for one week, then week three take two pills daily.    Dispense:  42 tablet    Refill:  0    No orders of the defined types were placed in this encounter.     Diagnoses and all orders for this visit:  Dizziness  Chronic ethmoidal sinusitis  Other orders -     cefUROXime (CEFTIN) 250 MG tablet; Take 1 tablet (250 mg total) by mouth 2 (two) times daily with a meal for 10 days. -     methylPREDNISolone (MEDROL) 4 MG tablet; Take 1 tablet (4 mg total) by mouth daily. After finishing the previously discussed dose for the first week, do this the second week : 4 pills daily for one week, then week three take two pills daily.  Virtual Visit via telephone Note  I discussed the limitations, risks, security and privacy concerns of performing an evaluation and management service by telephone and the availability of in person appointments. The patient was identified with two identifiers. Pt.expressed understanding and agreed to proceed. Pt. Is at home. Dr. Livia Snellen is in his office.  Follow Up Instructions:   I discussed the assessment and treatment plan with the patient. The patient was provided an opportunity to ask questions and all were answered. The patient agreed with the plan and demonstrated an understanding of the instructions.   The patient was advised to call back or seek an in-person evaluation if the symptoms worsen or if the condition fails to improve as anticipated.   Total minutes including chart review and phone contact time: 12   Follow up plan: No follow-ups on file.  Ronald Fraise, MD [place]

## 2018-12-19 ENCOUNTER — Ambulatory Visit (INDEPENDENT_AMBULATORY_CARE_PROVIDER_SITE_OTHER): Payer: PPO | Admitting: Family Medicine

## 2018-12-19 ENCOUNTER — Encounter: Payer: Self-pay | Admitting: Family Medicine

## 2018-12-19 DIAGNOSIS — R42 Dizziness and giddiness: Secondary | ICD-10-CM

## 2018-12-19 NOTE — Progress Notes (Signed)
Subjective:  Patient ID: Ronald Morrow., male    DOB: 05/21/1935  Age: 84 y.o. MRN: [id number]  CC: No chief complaint on file.   HPI Ronald BILOTTA Sr. presents for Swimmy headed badly for two days. A little better today. Associated with nausea. Feels "Top heavy" when he walks and has to look at the ground to avoid getting swimmy headed. Says swimmy headed feelin started when he went to ENT for ear problem about 4-5 months ago. Surfing my Chart and internet and feels problem is from inner ear. Denies room moving.  Depression screen [facility] 2/9 11/02/2018 09/28/2018 05/29/2018  Decreased Interest 0 0 0  Down, Depressed, Hopeless 0 0 0  PHQ - 2 Score 0 0 0  Altered sleeping - 0 -  Tired, decreased energy - 0 -  Change in appetite - 0 -  Feeling bad or failure about yourself  - 0 -  Trouble concentrating - 0 -  Moving slowly or fidgety/restless - 0 -  Suicidal thoughts - 0 -  PHQ-9 Score - 0 -  Difficult doing work/chores - Not difficult at all -  Some recent data might be hidden    History Ronald Russell has a past medical history of Allergy, Anxiety, Bladder cancer ([place]) (2010), CAD (coronary artery disease), Chronic bronchitis ([place]), GERD (gastroesophageal reflux disease), History of blood transfusion (1949), HOH (hard of hearing), Hypertension, Ocular migraine, Pneumonia, Seasonal allergies, Shingles, Temporal arteritis ([place]), and Thrombocytopenia ([place]) (11/29/2016).   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   His family history includes Alzheimer's disease in his father;  Cancer in his mother, sister, and sister.He reports that he has quit smoking. His smoking use included cigarettes. He has a 2.25 pack-year smoking history. He has never used smokeless tobacco. He reports that he does not drink alcohol or use drugs.    ROS Review of Systems  Objective:  There were no vitals taken for this visit.  BP Readings from Last 3 Encounters:  10/20/18 126/67  09/28/18 138/76  09/18/18 (!) 151/80    Wt Readings from Last 3 Encounters:  10/20/18 195 lb (88.5 kg)  09/28/18 205 lb (93 kg)  07/07/18 201 lb 12.8 oz (91.5 kg)     Physical Exam  Exam deferred. Pt. Harboring due to COVID 19. Phone visit performed.   Assessment & Plan:   Diagnoses and all orders for this visit:  Dizziness   Pt. Is convinced that he needs to continue antibiotic infdefinitely because he gets better  While taking it, but worse as soon as he finishes. I suggested that there may be another cause for the dizziness since it has been persistent. Pt. Not willing to consider other possibilities at this time. CT review from 09/18/18 confirmed ethmoid disease.I advissed Ronald Russell that After several courses of antibiotic, it should not be causing so much dizziness and other etiologies may need to be considered. Pt. Not willing to consider at this time. He has just started the new antibiotic prescribed last week. He will start the new steroid course today. He will let me know how he is doing when these are completed.    I am having Ronald Elsen.  Russell Sr. maintain his aspirin EC, CALCIUM-MAGNESIUM-VITAMIN D PO, Potassium Gluconate, sildenafil, fluticasone, fluocinonide cream, acetaminophen, rosuvastatin, atenolol, furosemide, pantoprazole, cefUROXime, and methylPREDNISolone.  Allergies as of 12/19/2018      Reactions   Uloric [febuxostat] Palpitations, Other (See Comments), Hypertension   Hypertension with palpitations and a sense of fuzziness and dizziness at the left side of his head   Doxazosin  Diarrhea   Augmentin [amoxicillin-pot Clavulanate] Diarrhea   Ciprofloxacin Diarrhea   Codeine Nausea And Vomiting, Other (See Comments)   Severe headache   Levaquin [levofloxacin] Diarrhea   Oxycodone Nausea And Vomiting   Tramadol Nausea And Vomiting   Vicodin [hydrocodone-acetaminophen] Nausea And Vomiting      Medication List       Accurate as of December 19, 2018 11:41 AM. If you have any questions, ask your nurse or doctor.        acetaminophen 650 MG CR tablet Commonly known as: TYLENOL Take 650 mg by mouth every 6 (six) hours as needed for pain.   aspirin EC 81 MG tablet Take 1 tablet (81 mg total) by mouth daily.   atenolol 25 MG tablet Commonly known as: TENORMIN Take 0.5 tablets (12.5 mg total) by mouth as needed. MAY TAKE 12.5 TO 25 MG AS NEEDED   CALCIUM-MAGNESIUM-VITAMIN D PO Take 2 tablets by mouth at bedtime.   cefUROXime 250 MG tablet Commonly known as: CEFTIN Take 1 tablet (250 mg total) by mouth 2 (two) times daily with a meal for 10 days.   fluocinonide cream 0.05 % Commonly known as: LIDEX Apply topically 2 (two) times daily. What changed:   how much to take  when to take this  reasons to take this   fluticasone 50 MCG/ACT nasal spray Commonly known as: FLONASE Place 2 sprays into both nostrils at bedtime.   furosemide 40 MG tablet Commonly known as: LASIX Take one tablet by mouth two times per week as directed   methylPREDNISolone 4 MG tablet Commonly known as: MEDROL Take 1 tablet (4 mg total) by mouth daily. After finishing the previously discussed dose for the first week, do this the second week : 4 pills daily for one week, then week three take two pills daily.   pantoprazole 40 MG tablet Commonly known as: PROTONIX TAKE 1 TABLET BY MOUTH EVERY DAY   Potassium Gluconate 550 MG Tabs Take 1 tablet (550 mg total) by mouth at bedtime. Need to take 1 tab one day and 2 the next day alternating   rosuvastatin 5 MG tablet Commonly known  as: CRESTOR Take 1 tablet (5 mg total) by mouth daily.   sildenafil 20 MG tablet Commonly known as: REVATIO Take 1 tablet (20 mg total) by mouth daily as needed (2-5 as needed).        Follow-up: Return if symptoms worsen or fail to improve.  Claretta Fraise, M.D.

## 2018-12-22 ENCOUNTER — Encounter: Payer: Self-pay | Admitting: Family Medicine

## 2018-12-22 ENCOUNTER — Ambulatory Visit (INDEPENDENT_AMBULATORY_CARE_PROVIDER_SITE_OTHER): Payer: PPO | Admitting: Family Medicine

## 2018-12-22 DIAGNOSIS — J309 Allergic rhinitis, unspecified: Secondary | ICD-10-CM

## 2018-12-22 MED ORDER — PANTOPRAZOLE SODIUM 40 MG PO TBEC: 40.0000 mg | DELAYED_RELEASE_TABLET | Freq: Two times a day (BID) | ORAL | 0 refills | Status: DC

## 2018-12-22 MED ORDER — LEVOCETIRIZINE DIHYDROCHLORIDE 5 MG PO TABS: 5.0000 mg | ORAL_TABLET | Freq: Every evening | ORAL | 5 refills | Status: DC

## 2018-12-22 MED ORDER — MECLIZINE HCL 25 MG PO TABS: 25.0000 mg | ORAL_TABLET | Freq: Three times a day (TID) | ORAL | 0 refills | Status: DC | PRN

## 2018-12-22 NOTE — Progress Notes (Signed)
Virtual Visit via telephone Note  I connected with Ronald Fear Sr. on 12/22/18 at 1327 by telephone and verified that I am speaking with the correct person using two identifiers. Ronald Fear Sr. is currently located at home and no other people are currently with her during visit. The provider, Fransisca Kaufmann Tommy Goostree, MD is located in their office at time of visit.  Call ended at 1337  I discussed the limitations, risks, security and privacy concerns of performing an evaluation and management service by telephone and the availability of in person appointments. I also discussed with the patient that there may be a patient responsible charge related to this service. The patient expressed understanding and agreed to proceed.   History and Present Illness: Patient is calling in with complaints of sinus infection and has been treated for sinus infection and has done 4 antibiotics and 3 course prednisone and he is still not feeling better and he feels like his head is floating and heart is fluttering.  He feels like his sinuses and lungs are clear and then he worsens after antibiotic.  The antibiotic helps while on it but returns after finishing.   No diagnosis found.  Outpatient Encounter Medications as of 12/22/2018  Medication Sig  . acetaminophen (TYLENOL) 650 MG CR tablet Take 650 mg by mouth every 6 (six) hours as needed for pain.  Marland Kitchen aspirin EC 81 MG tablet Take 1 tablet (81 mg total) by mouth daily.  Marland Kitchen atenolol (TENORMIN) 25 MG tablet Take 0.5 tablets (12.5 mg total) by mouth as needed. MAY TAKE 12.5 TO 25 MG AS NEEDED  . CALCIUM-MAGNESIUM-VITAMIN D PO Take 2 tablets by mouth at bedtime.   . cefUROXime (CEFTIN) 250 MG tablet Take 1 tablet (250 mg total) by mouth 2 (two) times daily with a meal for 10 days.  . fluocinonide cream (LIDEX) 0.05 % Apply topically 2 (two) times daily. (Patient taking differently: Apply 1 application topically 2 (two) times daily as needed. )  . fluticasone  (FLONASE) 50 MCG/ACT nasal spray Place 2 sprays into both nostrils at bedtime. (Patient not taking: Reported on 11/02/2018)  . furosemide (LASIX) 40 MG tablet Take one tablet by mouth two times per week as directed  . methylPREDNISolone (MEDROL) 4 MG tablet Take 1 tablet (4 mg total) by mouth daily. After finishing the previously discussed dose for the first week, do this the second week : 4 pills daily for one week, then week three take two pills daily.  . pantoprazole (PROTONIX) 40 MG tablet TAKE 1 TABLET BY MOUTH EVERY DAY  . Potassium Gluconate 550 MG TABS Take 1 tablet (550 mg total) by mouth at bedtime. Need to take 1 tab one day and 2 the next day alternating  . rosuvastatin (CRESTOR) 5 MG tablet Take 1 tablet (5 mg total) by mouth daily.  . sildenafil (REVATIO) 20 MG tablet Take 1 tablet (20 mg total) by mouth daily as needed (2-5 as needed).   No facility-administered encounter medications on file as of 12/22/2018.     Review of Systems  Constitutional: Negative for chills and fever.  HENT: Positive for congestion, rhinorrhea, sinus pressure, sneezing and sore throat. Negative for ear discharge, ear pain, postnasal drip and voice change.   Eyes: Negative for pain, discharge, redness and visual disturbance.  Respiratory: Positive for cough. Negative for shortness of breath and wheezing.   Cardiovascular: Negative for chest pain and leg swelling.  Musculoskeletal: Negative for back pain and gait problem.  Skin: Negative for rash.  Neurological: Positive for dizziness. Negative for weakness, light-headedness and numbness.  All other systems reviewed and are negative.   Observations/Objective: Patient sounds comfortable and in no acute distress  Assessment and Plan: Problem List Items Addressed This Visit    None    Visit Diagnoses    Allergic sinusitis    -  Primary   Relevant Medications   levocetirizine (XYZAL ALLERGY 24HR) 5 MG tablet   meclizine (ANTIVERT) 25 MG tablet        Follow Up Instructions: Follow up as needed    I discussed the assessment and treatment plan with the patient. The patient was provided an opportunity to ask questions and all were answered. The patient agreed with the plan and demonstrated an understanding of the instructions.   The patient was advised to call back or seek an in-person evaluation if the symptoms worsen or if the condition fails to improve as anticipated.  The above assessment and management plan was discussed with the patient. The patient verbalized understanding of and has agreed to the management plan. Patient is aware to call the clinic if symptoms persist or worsen. Patient is aware when to return to the clinic for a follow-up visit. Patient educated on when it is appropriate to go to the emergency department.    I provided 10 minutes of non-face-to-face time during this encounter.    Worthy Rancher, MD

## 2018-12-28 ENCOUNTER — Telehealth: Payer: Self-pay | Admitting: Family Medicine

## 2018-12-28 NOTE — Telephone Encounter (Signed)
Pt wanted to come in for office visit Offered televisit but pt declined Pt given address to Urgent Care Pt will go there for evaluation

## 2018-12-30 ENCOUNTER — Other Ambulatory Visit: Payer: Self-pay

## 2018-12-30 ENCOUNTER — Emergency Department (HOSPITAL_COMMUNITY)
Admission: EM | Admit: 2018-12-30 | Discharge: 2018-12-30 | Disposition: A | Discharge status: Home / Self Care | Payer: PPO | Attending: Emergency Medicine | Admitting: Emergency Medicine

## 2018-12-30 ENCOUNTER — Encounter (HOSPITAL_COMMUNITY): Payer: Self-pay | Admitting: Emergency Medicine

## 2018-12-30 ENCOUNTER — Emergency Department (HOSPITAL_COMMUNITY): Payer: PPO

## 2018-12-30 ENCOUNTER — Telehealth: Payer: Self-pay | Admitting: Physician Assistant

## 2018-12-30 DIAGNOSIS — R002 Palpitations: Secondary | ICD-10-CM | POA: Diagnosis not present

## 2018-12-30 DIAGNOSIS — Z951 Presence of aortocoronary bypass graft: Secondary | ICD-10-CM | POA: Diagnosis not present

## 2018-12-30 DIAGNOSIS — I10 Essential (primary) hypertension: Secondary | ICD-10-CM | POA: Diagnosis not present

## 2018-12-30 DIAGNOSIS — Z7982 Long term (current) use of aspirin: Secondary | ICD-10-CM | POA: Diagnosis not present

## 2018-12-30 DIAGNOSIS — Z87891 Personal history of nicotine dependence: Secondary | ICD-10-CM | POA: Diagnosis not present

## 2018-12-30 DIAGNOSIS — Z8551 Personal history of malignant neoplasm of bladder: Secondary | ICD-10-CM | POA: Diagnosis not present

## 2018-12-30 DIAGNOSIS — R079 Chest pain, unspecified: Secondary | ICD-10-CM | POA: Diagnosis not present

## 2018-12-30 DIAGNOSIS — I2510 Atherosclerotic heart disease of native coronary artery without angina pectoris: Secondary | ICD-10-CM | POA: Diagnosis not present

## 2018-12-30 DIAGNOSIS — Z79899 Other long term (current) drug therapy: Secondary | ICD-10-CM | POA: Diagnosis not present

## 2018-12-30 LAB — CBC
HCT: 40.5 % (ref 39.0–52.0)
Hemoglobin: 12.5 g/dL — ABNORMAL LOW (ref 13.0–17.0)
MCH: 26.4 pg (ref 26.0–34.0)
MCHC: 30.9 g/dL (ref 30.0–36.0)
MCV: 85.6 fL (ref 80.0–100.0)
Platelets: 131 10*3/uL — ABNORMAL LOW (ref 150–400)
RBC: 4.73 MIL/uL (ref 4.22–5.81)
RDW: 17.7 % — ABNORMAL HIGH (ref 11.5–15.5)
WBC: 6.1 10*3/uL (ref 4.0–10.5)
nRBC: 0 % (ref 0.0–0.2)

## 2018-12-30 LAB — BASIC METABOLIC PANEL
Anion gap: 8 (ref 5–15)
BUN: 16 mg/dL (ref 8–23)
CO2: 23 mmol/L (ref 22–32)
Calcium: 8.7 mg/dL — ABNORMAL LOW (ref 8.9–10.3)
Chloride: 109 mmol/L (ref 98–111)
Creatinine, Ser: 1.23 mg/dL (ref 0.61–1.24)
GFR calc Af Amer: >60 mL/min (ref >60)
GFR calc non Af Amer: 54 mL/min — ABNORMAL LOW (ref >60)
Glucose, Bld: 123 mg/dL — ABNORMAL HIGH (ref 70–99)
Potassium: 3.6 mmol/L (ref 3.5–5.1)
Sodium: 140 mmol/L (ref 135–145)

## 2018-12-30 LAB — TROPONIN I (HIGH SENSITIVITY)
Troponin I (High Sensitivity): 8 ng/L (ref <18)
Troponin I (High Sensitivity): 7 ng/L (ref <18)

## 2018-12-30 MED ORDER — SODIUM CHLORIDE 0.9% FLUSH: 3.0000 mL | Freq: Once | INTRAVENOUS | Status: DC

## 2018-12-30 NOTE — ED Notes (Signed)
Pt discharged with all belongings. Discharge instructions reviewed with pt, and pt verbalized understanding. Opportunity for questions provided.  

## 2018-12-30 NOTE — Telephone Encounter (Signed)
   The patient called the answering service after-hours today. History reviewed. Has h/o post-op AF but also PACS/PVCs. He has been struggling with an ongoing "sinus infection" tx with several rounds of abx and steroids. He has had occasional skips and has been taking atenolol 1/2 tablet BID which has worked well up until Wednesday. The skipping seems more prominent and the atenolol does not really provide any lasting relief. He feels a little "weak" at times but no CP/SOB or low BP. It does seem he's struggled with dizziness in recent notes. Given his h/o atrial fib I think he needs to be seen today to have an EKG to assess for recurrence, since he is not on anticoagulation and could be at risk for stroke if this goes undiagnosed. We discussed option of ER vs urgent care and he prefers the latter. His wife will drive him. He verbalized understanding and gratitude. I will route this message to [facility] triage to help f/u with patient on Monday and arrange for him to have early office f/u.  Charlie Pitter

## 2018-12-30 NOTE — Discharge Instructions (Addendum)
There is nothing new or concerning on your EKG, although this does not rule out an arrhythmia.  You should contact your cardiologist on Monday to schedule follow-up appointment for further work-up.  They may need you to wear a heart monitor for 24 hours or longer.  Your lab work does not show any concern for cardiac disease at the moment.  You are safe to discharge home.  If you start to develop palpitations upon continue doing what you are cardiologist advise you to do.  I would continue to hold off on taking the meclizine if you believe that is what is contributing to worsening palpitations and discuss this further with your primary care provider at a follow-up visit.  Reasons to come back to the ED would be if you were experiencing significant chest pain, or suddenly becoming short of breath, difficulty breathing.
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
## 2018-12-31 ENCOUNTER — Telehealth: Payer: Self-pay | Admitting: Internal Medicine

## 2018-12-31 NOTE — Telephone Encounter (Signed)
Pt recently seen in ED for palpitations. Please set up for event monitor (21 day)

## 2019-01-01 ENCOUNTER — Telehealth: Payer: Self-pay | Admitting: Internal Medicine

## 2019-01-01 DIAGNOSIS — R002 Palpitations: Secondary | ICD-10-CM

## 2019-01-01 NOTE — Telephone Encounter (Signed)
See telephone encounter 01/01/19.

## 2019-01-01 NOTE — Telephone Encounter (Signed)
° °  Patient was in the ED on Saturday night for an irregular heart beat.  He was told upon his discharge to set up a follow-up appointment.   When this has happened in the past, he was told take an extra 1/2 of his BP pill if his heart starts to beat irregularly.  Taking the extra medication has helped, and he has done that since his ED discharge as well.   He just wants to know if he should just keep taking the extra medication, or if Dr. Harrington Challenger wants to see him for an appointment. He does not want to risk exposure if possible

## 2019-01-01 NOTE — Telephone Encounter (Signed)
Fay Records, MD at 12/31/2018 11:11 PM  Status: Signed    Pt recently seen in ED for palpitations. Please set up for event monitor (21 day)

## 2019-01-03 ENCOUNTER — Telehealth: Payer: Self-pay | Admitting: Family Medicine

## 2019-01-03 ENCOUNTER — Telehealth: Payer: Self-pay | Admitting: *Deleted

## 2019-01-03 DIAGNOSIS — R42 Dizziness and giddiness: Secondary | ICD-10-CM

## 2019-01-03 NOTE — Telephone Encounter (Signed)
Spoke with patient. He is now ready to schedule echo. He is tired of being swimmy headed and hopes this will be a helpful exam. His heart is not skipping as much since he has been taking an extra atenolol prn for this.  Also waiting to hear about event monitor.  Aware I have sent a message to monitor tech and echo scheduler.

## 2019-01-03 NOTE — Telephone Encounter (Signed)
Patient called stating he has not heard back. Calling to check on status.

## 2019-01-03 NOTE — Telephone Encounter (Signed)
He wants referral because his dizziness has been unresolved.   The meclizine helped but he thinks it got his heart out of rhythym and caused a trip to hospital. Please advise.

## 2019-01-03 NOTE — Telephone Encounter (Signed)
Referral placed and pt aware   

## 2019-01-03 NOTE — Telephone Encounter (Signed)
Okay to go ahead and put in neurology referral, diagnosis dizziness and or vertigo

## 2019-01-03 NOTE — Telephone Encounter (Signed)
[employer] to ship a 30 day cardiac event monitor to the patient home.  Instructions reviewed briefly as they are included in the monitor kit.

## 2019-01-03 NOTE — Telephone Encounter (Signed)
Pt would like referral to neurology for persistent dizziness.

## 2019-01-05 NOTE — Telephone Encounter (Signed)
Monitor mailed 01/03/19 according to notes. Echo is scheduled for 01/08/19.

## 2019-01-08 ENCOUNTER — Ambulatory Visit (HOSPITAL_COMMUNITY): Payer: PPO | Attending: Cardiology

## 2019-01-08 ENCOUNTER — Other Ambulatory Visit: Payer: Self-pay

## 2019-01-08 ENCOUNTER — Other Ambulatory Visit: Payer: PPO

## 2019-01-08 DIAGNOSIS — I2510 Atherosclerotic heart disease of native coronary artery without angina pectoris: Secondary | ICD-10-CM | POA: Insufficient documentation

## 2019-01-08 DIAGNOSIS — E785 Hyperlipidemia, unspecified: Secondary | ICD-10-CM

## 2019-01-08 DIAGNOSIS — R0602 Shortness of breath: Secondary | ICD-10-CM | POA: Diagnosis not present

## 2019-01-08 DIAGNOSIS — R002 Palpitations: Secondary | ICD-10-CM | POA: Diagnosis not present

## 2019-01-09 ENCOUNTER — Encounter: Payer: Self-pay | Admitting: Neurology

## 2019-01-09 ENCOUNTER — Ambulatory Visit: Payer: PPO | Admitting: Neurology

## 2019-01-09 ENCOUNTER — Telehealth: Payer: Self-pay | Admitting: Neurology

## 2019-01-09 VITALS — BP 109/70 | HR 68 | Temp 97.8°F | Ht 72.0 in | Wt 201.0 lb

## 2019-01-09 DIAGNOSIS — R42 Dizziness and giddiness: Secondary | ICD-10-CM | POA: Diagnosis not present

## 2019-01-09 DIAGNOSIS — G441 Vascular headache, not elsewhere classified: Secondary | ICD-10-CM

## 2019-01-09 DIAGNOSIS — H93A9 Pulsatile tinnitus, unspecified ear: Secondary | ICD-10-CM | POA: Diagnosis not present

## 2019-01-09 DIAGNOSIS — R2689 Other abnormalities of gait and mobility: Secondary | ICD-10-CM

## 2019-01-09 DIAGNOSIS — R51 Headache: Secondary | ICD-10-CM | POA: Diagnosis not present

## 2019-01-09 DIAGNOSIS — H539 Unspecified visual disturbance: Secondary | ICD-10-CM | POA: Diagnosis not present

## 2019-01-09 DIAGNOSIS — H93A2 Pulsatile tinnitus, left ear: Secondary | ICD-10-CM | POA: Diagnosis not present

## 2019-01-09 DIAGNOSIS — R519 Headache, unspecified: Secondary | ICD-10-CM

## 2019-01-09 LAB — LIPID PANEL
Chol/HDL Ratio: 4.5 ratio (ref 0.0–5.0)
Cholesterol, Total: 163 mg/dL (ref 100–199)
HDL: 36 mg/dL — ABNORMAL LOW (ref >39)
LDL Calculated: 74 mg/dL (ref 0–99)
Triglycerides: 263 mg/dL — ABNORMAL HIGH (ref 0–149)
VLDL Cholesterol Cal: 53 mg/dL — ABNORMAL HIGH (ref 5–40)

## 2019-01-09 NOTE — Telephone Encounter (Signed)
health team order sent to GI. No auth they will reach out to the patient to schedule.  

## 2019-01-09 NOTE — Patient Instructions (Signed)
Labs today, MRI of the brain and blood vessels (we will call). Follow up as needed here.    Orthostatic Hypotension Blood pressure is a measurement of how strongly, or weakly, your blood is pressing against the walls of your arteries. Orthostatic hypotension is a sudden drop in blood pressure that happens when you quickly change positions, such as when you get up from sitting or lying down. Arteries are blood vessels that carry blood from your heart throughout your body. When blood pressure is too low, you may not get enough blood to your brain or to the rest of your organs. This can cause weakness, light-headedness, rapid heartbeat, and fainting. This can last for just a few seconds or for up to a few minutes. Orthostatic hypotension is usually not a serious problem. However, if it happens frequently or gets worse, it may be a sign of something more serious. What are the causes? This condition may be caused by:  Sudden changes in posture, such as standing up quickly after you have been sitting or lying down.  Blood loss.  Loss of body fluids (dehydration).  Heart problems.  Hormone (endocrine) problems.  Pregnancy.  Severe infection.  Lack of certain nutrients.  Severe allergic reactions (anaphylaxis).  Certain medicines, such as blood pressure medicine or medicines that make the body lose excess fluids (diuretics). Sometimes, this condition can be caused by not taking medicine as directed, such as taking too much of a certain medicine. What increases the risk? The following factors may make you more likely to develop this condition:  Age. Risk increases as you get older.  Conditions that affect the heart or the central nervous system.  Taking certain medicines, such as blood pressure medicine or diuretics.  Being pregnant. What are the signs or symptoms? Symptoms of this condition may include:  Weakness.  Light-headedness.  Dizziness.  Blurred vision.  Fatigue.   Rapid heartbeat.  Fainting, in severe cases. How is this diagnosed? This condition is diagnosed based on:  Your medical history.  Your symptoms.  Your blood pressure measurement. Your health care provider will check your blood pressure when you are: ? Lying down. ? Sitting. ? Standing. A blood pressure reading is recorded as two numbers, such as "120 over 80" (or 120/80). The first ("top") number is called the systolic pressure. It is a measure of the pressure in your arteries as your heart beats. The second ("bottom") number is called the diastolic pressure. It is a measure of the pressure in your arteries when your heart relaxes between beats. Blood pressure is measured in a unit called mm Hg. Healthy blood pressure for most adults is 120/80. If your blood pressure is below 90/60, you may be diagnosed with hypotension. Other information or tests that may be used to diagnose orthostatic hypotension include:  Your other vital signs, such as your heart rate and temperature.  Blood tests.  Tilt table test. For this test, you will be safely secured to a table that moves you from a lying position to an upright position. Your heart rhythm and blood pressure will be monitored during the test. How is this treated? This condition may be treated by:  Changing your diet. This may involve eating more salt (sodium) or drinking more water.  Taking medicines to raise your blood pressure.  Changing the dosage of certain medicines you are taking that might be lowering your blood pressure.  Wearing compression stockings. These stockings help to prevent blood clots and reduce swelling in your  legs. In some cases, you may need to go to the hospital for:  Fluid replacement. This means you will receive fluids through an IV.  Blood replacement. This means you will receive donated blood through an IV (transfusion).  Treating an infection or heart problems, if this applies.  Monitoring. You may need  to be monitored while medicines that you are taking wear off. Follow these instructions at home: Eating and drinking   Drink enough fluid to keep your urine pale yellow.  Eat a healthy diet, and follow instructions from your health care provider about eating or drinking restrictions. A healthy diet includes: ? Fresh fruits and vegetables. ? Whole grains. ? Lean meats. ? Low-fat dairy products.  Eat extra salt only as directed. Do not add extra salt to your diet unless your health care provider told you to do that.  Eat frequent, small meals.  Avoid standing up suddenly after eating. Medicines  Take over-the-counter and prescription medicines only as told by your health care provider. ? Follow instructions from your health care provider about changing the dosage of your current medicines, if this applies. ? Do not stop or adjust any of your medicines on your own. General instructions   Wear compression stockings as told by your health care provider.  Get up slowly from lying down or sitting positions. This gives your blood pressure a chance to adjust.  Avoid hot showers and excessive heat as directed by your health care provider.  Return to your normal activities as told by your health care provider. Ask your health care provider what activities are safe for you.  Do not use any products that contain nicotine or tobacco, such as cigarettes, e-cigarettes, and chewing tobacco. If you need help quitting, ask your health care provider.  Keep all follow-up visits as told by your health care provider. This is important. Contact a health care provider if you:  Vomit.  Have diarrhea.  Have a fever for more than 2-3 days.  Feel more thirsty than usual.  Feel weak and tired. Get help right away if you:  Have chest pain.  Have a fast or irregular heartbeat.  Develop numbness in any part of your body.  Cannot move your arms or your legs.  Have trouble speaking.  Become  sweaty or feel light-headed.  Faint.  Feel short of breath.  Have trouble staying awake.  Feel confused. Summary  Orthostatic hypotension is a sudden drop in blood pressure that happens when you quickly change positions.  Orthostatic hypotension is usually not a serious problem.  It is diagnosed by having your blood pressure taken lying down, sitting, and then standing.  It may be treated by changing your diet or adjusting your medicines. This information is not intended to replace advice given to you by your health care provider. Make sure you discuss any questions you have with your health care provider. Document Released: 05/21/2002 Document Revised: 11/24/2017 Document Reviewed: 11/24/2017 Elsevier Patient Education  2020 [employer].
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
## 2019-01-10 ENCOUNTER — Telehealth: Payer: Self-pay | Admitting: *Deleted

## 2019-01-10 DIAGNOSIS — R42 Dizziness and giddiness: Secondary | ICD-10-CM

## 2019-01-10 DIAGNOSIS — R002 Palpitations: Secondary | ICD-10-CM

## 2019-01-10 LAB — CBC WITH DIFFERENTIAL/PLATELET
Basophils Absolute: 0.1 10*3/uL (ref 0.0–0.2)
Basos: 1 %
EOS (ABSOLUTE): 0.1 10*3/uL (ref 0.0–0.4)
Eos: 2 %
Hematocrit: 40.3 % (ref 37.5–51.0)
Hemoglobin: 13 g/dL (ref 13.0–17.7)
Immature Grans (Abs): 0 10*3/uL (ref 0.0–0.1)
Immature Granulocytes: 0 %
Lymphocytes Absolute: 1.3 10*3/uL (ref 0.7–3.1)
Lymphs: 23 %
MCH: 26.5 pg — ABNORMAL LOW (ref 26.6–33.0)
MCHC: 32.3 g/dL (ref 31.5–35.7)
MCV: 82 fL (ref 79–97)
Monocytes Absolute: 0.7 10*3/uL (ref 0.1–0.9)
Monocytes: 12 %
Neutrophils Absolute: 3.5 10*3/uL (ref 1.4–7.0)
Neutrophils: 62 %
Platelets: 195 10*3/uL (ref 150–450)
RBC: 4.9 x10E6/uL (ref 4.14–5.80)
RDW: 17.1 % — ABNORMAL HIGH (ref 11.6–15.4)
WBC: 5.6 10*3/uL (ref 3.4–10.8)

## 2019-01-10 LAB — COMPREHENSIVE METABOLIC PANEL
ALT: 18 IU/L (ref 0–44)
AST: 20 IU/L (ref 0–40)
Albumin/Globulin Ratio: 2.2 (ref 1.2–2.2)
Albumin: 4.4 g/dL (ref 3.6–4.6)
Alkaline Phosphatase: 54 IU/L (ref 39–117)
BUN/Creatinine Ratio: 11 (ref 10–24)
BUN: 14 mg/dL (ref 8–27)
Bilirubin Total: 0.6 mg/dL (ref 0.0–1.2)
CO2: 23 mmol/L (ref 20–29)
Calcium: 9 mg/dL (ref 8.6–10.2)
Chloride: 103 mmol/L (ref 96–106)
Creatinine, Ser: 1.33 mg/dL — ABNORMAL HIGH (ref 0.76–1.27)
GFR calc Af Amer: 57 mL/min/{1.73_m2} — ABNORMAL LOW (ref >59)
GFR calc non Af Amer: 49 mL/min/{1.73_m2} — ABNORMAL LOW (ref >59)
Globulin, Total: 2 g/dL (ref 1.5–4.5)
Glucose: 111 mg/dL — ABNORMAL HIGH (ref 65–99)
Potassium: 4.1 mmol/L (ref 3.5–5.2)
Sodium: 141 mmol/L (ref 134–144)
Total Protein: 6.4 g/dL (ref 6.0–8.5)

## 2019-01-10 LAB — SEDIMENTATION RATE: Sed Rate: 14 mm/hr (ref 0–30)

## 2019-01-10 LAB — C-REACTIVE PROTEIN: CRP: 4 mg/L (ref 0–10)

## 2019-01-10 LAB — TSH: TSH: 5.85 u[IU]/mL — ABNORMAL HIGH (ref 0.450–4.500)

## 2019-01-10 NOTE — Telephone Encounter (Signed)
-----   Message from Fay Records, MD sent at 01/09/2019 10:23 PM EDT ----- Pt also had event monitor   Waiting for results

## 2019-01-10 NOTE — Telephone Encounter (Signed)
Spent over 25 min on phone with patient. Discussed his sinus, swimmy headedness, heart skips, lightheadness, his echocardiogram. Saw neuro doctor yesterday and would like Dr. Harrington Challenger to review the notes. Per patient neuro said he may have orthostatic hypotension.  His BP dropped 30 points when he stood up.  I adv him to be sure to stay hydrated every day and that he can increase salt a little bit in his diet.  Recommended compression stockings to help with blood flow back from lower extremities.   Pt taking atenolol 1/2 -whole tablet daily for palpitations.  Not taking lasix at all.   Will forward to Dr. Harrington Challenger for review. Pt just received event monitor.

## 2019-01-11 ENCOUNTER — Telehealth: Payer: Self-pay | Admitting: *Deleted

## 2019-01-11 ENCOUNTER — Ambulatory Visit (INDEPENDENT_AMBULATORY_CARE_PROVIDER_SITE_OTHER): Payer: PPO

## 2019-01-11 DIAGNOSIS — R002 Palpitations: Secondary | ICD-10-CM | POA: Diagnosis not present

## 2019-01-11 MED ORDER — MIDODRINE HCL 2.5 MG PO TABS: 2.5000 mg | ORAL_TABLET | Freq: Two times a day (BID) | ORAL | 11 refills | Status: DC

## 2019-01-11 NOTE — Addendum Note (Signed)
Addended by: Emmaline Life on: 01/11/2019 02:41 PM   Modules accepted: Orders

## 2019-01-11 NOTE — Telephone Encounter (Signed)
-----   Message from Melvenia Beam, MD sent at 01/10/2019  7:32 PM EDT ----- Abnormal thyroid, follow up with Dr. Livia Snellen at next appointment. thanks

## 2019-01-11 NOTE — Telephone Encounter (Signed)
Called pt   He was seen by Dr Jaynee Eagles a few days ago   Orthostatic on visit   Has MRI sched for later in August  Last night was sitting in parking lot waiting for wife   Felt heart flutter several times then stop   Felt like he was about to pass out but didn't  Also he is concerned that thyroid is off  Recomm 1.   Will add low dose midodrin 2.5 mg at 8 and 2 pm to regimen   Keep track of BP 2   Come by office to check free T3, free T4 and cortisol 3.   Put monitor on   It is a monitor that should notifiy for any dangerous rhythms 4  Stay hydrated   Sit to stand carefullly   I would avoid driving   Let wife drive  I told him someone from office would be contacting him

## 2019-01-11 NOTE — Telephone Encounter (Signed)
I have spoken with the patient and he is aware of his results.  He will follow up with his PCP.

## 2019-01-11 NOTE — Telephone Encounter (Signed)
Midodrine Rx sent to patient's pharmacy Lab appointment scheduled for Friday 7/31 at our office Patient's wife will need to accompany patient to help with ambulation I advised him to record BP once he started the Midodrine and to let us know how he is doing. He thanked me for the call.     COVID-19 Pre-Screening Questions:  . In the past 7 to 10 days have you had a cough,  shortness of breath, headache, congestion, fever (100 or greater) body aches, chills, sore throat, or sudden loss of taste or sense of smell? NO . Have you been around anyone with known Covid 19. NO . Have you been around anyone who is awaiting Covid 19 test results in the past 7 to 10 days? NO . Have you been around anyone who has been exposed to Covid 19, or has mentioned symptoms of Covid 19 within the past 7 to 10 days? NO  If you have any concerns/questions about symptoms patients report during screening (either on the phone or at threshold). Contact the provider seeing the patient or DOD for further guidance.  If neither are available contact a member of the leadership team.

## 2019-01-12 ENCOUNTER — Other Ambulatory Visit: Payer: PPO | Admitting: *Deleted

## 2019-01-12 ENCOUNTER — Other Ambulatory Visit: Payer: Self-pay

## 2019-01-12 DIAGNOSIS — R42 Dizziness and giddiness: Secondary | ICD-10-CM

## 2019-01-12 DIAGNOSIS — R002 Palpitations: Secondary | ICD-10-CM

## 2019-01-13 LAB — T4, FREE: Free T4: 0.91 ng/dL (ref 0.82–1.77)

## 2019-01-13 LAB — CORTISOL: Cortisol: 8.1 ug/dL

## 2019-01-13 LAB — T3, FREE: T3, Free: 2.7 pg/mL (ref 2.0–4.4)

## 2019-01-16 ENCOUNTER — Telehealth: Payer: Self-pay | Admitting: Internal Medicine

## 2019-01-16 ENCOUNTER — Encounter: Payer: Self-pay | Admitting: Family Medicine

## 2019-01-16 ENCOUNTER — Telehealth: Payer: Self-pay

## 2019-01-16 ENCOUNTER — Ambulatory Visit (INDEPENDENT_AMBULATORY_CARE_PROVIDER_SITE_OTHER): Payer: PPO | Admitting: Family Medicine

## 2019-01-16 DIAGNOSIS — R002 Palpitations: Secondary | ICD-10-CM | POA: Diagnosis not present

## 2019-01-16 DIAGNOSIS — I25118 Atherosclerotic heart disease of native coronary artery with other forms of angina pectoris: Secondary | ICD-10-CM

## 2019-01-16 DIAGNOSIS — E039 Hypothyroidism, unspecified: Secondary | ICD-10-CM | POA: Diagnosis not present

## 2019-01-16 DIAGNOSIS — I951 Orthostatic hypotension: Secondary | ICD-10-CM | POA: Diagnosis not present

## 2019-01-16 DIAGNOSIS — I10 Essential (primary) hypertension: Secondary | ICD-10-CM | POA: Diagnosis not present

## 2019-01-16 DIAGNOSIS — E782 Mixed hyperlipidemia: Secondary | ICD-10-CM | POA: Diagnosis not present

## 2019-01-16 DIAGNOSIS — F411 Generalized anxiety disorder: Secondary | ICD-10-CM | POA: Diagnosis not present

## 2019-01-16 MED ORDER — LEVOTHYROXINE SODIUM 50 MCG PO TABS: 50.0000 ug | ORAL_TABLET | Freq: Every day | ORAL | 0 refills | Status: DC

## 2019-01-16 NOTE — Telephone Encounter (Signed)
New message:     Patient calling concering some medication issue and would like for some one to call him and yesterday his pulse was 53.

## 2019-01-16 NOTE — Telephone Encounter (Signed)
Pt calling concerning feeling "skipping" of his heart. States that this "skipping" feeling woke him up at 3am this morning so he took his Atenolol. He then checked up HR at 9am which was 53 bmp, advised pt to check HR before taking Atenolol and if HR is below 60bpm to hold this medication.   Pt states Dr. Harrington Challenger advised him not to be driving but he wants to cut his grass. I advised pt not to cut his grass at this time d/t his recent feelings as well as the heat.   Pt denies CP, lightheaded or dizziness. Pt's only complaint at this time is his heart skipping.   Told pt I wold ask Dr. Harrington Challenger for further advisement. Advised pt to call back with any other issues. Will route to [employer] and Therapist, sports.

## 2019-01-16 NOTE — Progress Notes (Signed)
Subjective:    Patient ID: Ronald Russell., male    DOB: 1935-03-05, 84 y.o.   MRN: [id number]   HPI: Ronald Russell. is a 84 y.o. male presenting for continued dizziness. The swimmy headed feeling is gone. He recently started on midodrine per cardiology based on orthostasis diagnosed by neurology. He has discontinued the lasix. However he still gets dizzy. He is confused about his thyroid as the TSH indicated underactivit but the free T4 and Free T3 were normal. Energy is poor. No other stigmata of underactive thyroid including constipation, weight gain, sensation of being cold.  Patient is concerned that he is having palpitations.  He is wearing a monitor per his cardiologist.  He wants to know if he has a rhythm that is potentially fatal.  He wants to know if that rhythm is being caused by his thyroid condition.   Depression screen [facility] 2/9 11/02/2018 09/28/2018 05/29/2018 03/15/2018 02/17/2018  Decreased Interest 0 0 0 0 0  Down, Depressed, Hopeless 0 0 0 0 0  PHQ - 2 Score 0 0 0 0 0  Altered sleeping - 0 - - -  Tired, decreased energy - 0 - - -  Change in appetite - 0 - - -  Feeling bad or failure about yourself  - 0 - - -  Trouble concentrating - 0 - - -  Moving slowly or fidgety/restless - 0 - - -  Suicidal thoughts - 0 - - -  PHQ-9 Score - 0 - - -  Difficult doing work/chores - Not difficult at all - - -  Some recent data might be hidden     Relevant past medical, surgical, family and social history reviewed and updated as indicated.  Interim medical history since our last visit reviewed. Allergies and medications reviewed and updated.  ROS:  Review of Systems  Constitutional: Positive for fatigue.  HENT: Negative.   Eyes: Negative for visual disturbance.  Respiratory: Positive for shortness of breath. Negative for cough.   Cardiovascular: Positive for palpitations. Negative for chest pain and leg swelling.  Gastrointestinal: Negative for abdominal pain, diarrhea,  nausea and vomiting.  Genitourinary: Negative for difficulty urinating.  Musculoskeletal: Negative for arthralgias and myalgias.  Skin: Negative for rash.  Neurological: Positive for dizziness, weakness and light-headedness. Negative for headaches.  Psychiatric/Behavioral: Negative for sleep disturbance.     Social History   Tobacco Use  Smoking Status Former Smoker  . Packs/day: 0.75  . Years: 3.00  . Pack years: 2.25  . Types: Cigarettes  Smokeless Tobacco Never Used  Tobacco Comment   02/13/2013 "quit smoking age 79"       Objective:     Wt Readings from Last 3 Encounters:  01/09/19 201 lb (91.2 kg)  10/20/18 195 lb (88.5 kg)  09/28/18 205 lb (93 kg)     Exam deferred. Pt. Harboring due to COVID 19. Phone visit performed.   Assessment & Plan:   1. Orthostatic hypotension   2. Mixed hyperlipidemia   3. Palpitations   4. Essential hypertension   5. Coronary artery disease of native artery of native heart with stable angina pectoris ([place])   6. GAD (generalized anxiety disorder)   7. Hypothyroidism, unspecified type     Meds ordered this encounter  Medications  . levothyroxine (SYNTHROID) 50 MCG tablet    Sig: Take 1 tablet (50 mcg total) by mouth daily.    Dispense:  90 tablet    Refill:  0  No orders of the defined types were placed in this encounter.     Diagnoses and all orders for this visit:  Orthostatic hypotension  Mixed hyperlipidemia  Palpitations  Essential hypertension  Coronary artery disease of native artery of native heart with stable angina pectoris ([facility])  GAD (generalized anxiety disorder)  Hypothyroidism, unspecified type  Other orders -     levothyroxine (SYNTHROID) 50 MCG tablet; Take 1 tablet (50 mcg total) by mouth daily.    Virtual Visit via telephone Note  I discussed the limitations, risks, security and privacy concerns of performing an evaluation and management service by telephone and the availability of in  person appointments. The patient was identified with two identifiers. Pt.expressed understanding and agreed to proceed. Pt. Is at home. Dr. Livia Snellen is in his office.  Follow Up Instructions:   I discussed the assessment and treatment plan with the patient. The patient was provided an opportunity to ask questions and all were answered. The patient agreed with the plan and demonstrated an understanding of the instructions.   The patient was advised to call back or seek an in-person evaluation if the symptoms worsen or if the condition fails to improve as anticipated.   Total minutes including chart review and phone contact time: 35   Follow up plan: Return in about 6 weeks (around 02/27/2019).  Claretta Fraise, MD [place]

## 2019-01-19 ENCOUNTER — Telehealth: Payer: Self-pay | Admitting: Physician Assistant

## 2019-01-19 NOTE — Telephone Encounter (Signed)
Paged by answering service. Recently dealing with orthostasis and started on midodrine. However patient reports "swimy headed and extremely sleep" after 30 minutes of taking midodrine. BP of 101/62 with pulse of 64. Advised to hold midodrine and monitor symptoms. If worsen, he will come to ER.

## 2019-01-22 ENCOUNTER — Telehealth: Payer: Self-pay | Admitting: Internal Medicine

## 2019-01-22 NOTE — Telephone Encounter (Signed)
Follow Up:      Pt says he is wearing a monitor now until 02-09-19.Ronald Russell He says his heart is still skipping. He wants to know what can be done before the 28th of this month. He also would like to know what to  do about his thyroid. He received the results. He just wants to know what to do about the results please.

## 2019-01-22 NOTE — Telephone Encounter (Signed)
Sat evening cut his grass and picked up a few limbs, did not exert himself.  Engineer, building services.   After that started skipping been taking atenolol 1/2 tablet every 12 hours  He is pushing the buttons on the monitor to record symptoms.   Feels fluttering in chest, when its skipping.   Thinks it makes him worried and feels like he can't breath.  105/64, 53  Tried the midodrine but he became weak and swimmy headed, took for 5 days, quit taking it.  He was weak, arms were tingly.      ___________________________________________________________________________ Notes recorded by Fay Records, MD on 01/15/2019 at 10:19 PM EDT  OVerall thyroid funciton are normal  Adrenal function is normal   Pt now on midodrine  WIll Need to f/u with BP response in a few weeks

## 2019-02-03 ENCOUNTER — Other Ambulatory Visit: Payer: Self-pay

## 2019-02-03 ENCOUNTER — Ambulatory Visit
Admission: RE | Admit: 2019-02-03 | Discharge: 2019-02-03 | Disposition: A | Discharge status: Home / Self Care | Payer: PPO | Source: Ambulatory Visit | Attending: Neurology | Admitting: Neurology

## 2019-02-03 DIAGNOSIS — H93A2 Pulsatile tinnitus, left ear: Secondary | ICD-10-CM

## 2019-02-03 DIAGNOSIS — H93A9 Pulsatile tinnitus, unspecified ear: Secondary | ICD-10-CM

## 2019-02-03 DIAGNOSIS — H539 Unspecified visual disturbance: Secondary | ICD-10-CM

## 2019-02-03 DIAGNOSIS — R519 Headache, unspecified: Secondary | ICD-10-CM

## 2019-02-03 DIAGNOSIS — R2689 Other abnormalities of gait and mobility: Secondary | ICD-10-CM

## 2019-02-03 DIAGNOSIS — R42 Dizziness and giddiness: Secondary | ICD-10-CM

## 2019-02-03 DIAGNOSIS — G441 Vascular headache, not elsewhere classified: Secondary | ICD-10-CM

## 2019-02-03 MED ORDER — GADOBENATE DIMEGLUMINE 529 MG/ML IV SOLN
15.0000 mL | Freq: Once | INTRAVENOUS | Status: AC | PRN
  Administered 2019-02-03: 15 mL via INTRAVENOUS

## 2019-02-06 ENCOUNTER — Telehealth: Payer: Self-pay | Admitting: Internal Medicine

## 2019-02-06 DIAGNOSIS — E782 Mixed hyperlipidemia: Secondary | ICD-10-CM

## 2019-02-06 DIAGNOSIS — I2510 Atherosclerotic heart disease of native coronary artery without angina pectoris: Secondary | ICD-10-CM

## 2019-02-06 NOTE — Telephone Encounter (Signed)
Pt c/o medication issue:  1. Name of Medication:  Rosuvastatin  2. How are you currently taking this medication (dosage and times per day)?  1 time a day   3. Are you having a reaction (difficulty breathing--STAT)?  No  4. What is your medication issue?  Dizziness, nauseated- pt stopped taking his Rosuvastatin last Thurday

## 2019-02-09 NOTE — Telephone Encounter (Signed)
Follow-up:  Patient has not heard anything from the office since he called on  Tuesday. He also says his bp was all over the place   Pt c/o BP issue: STAT if pt c/o blurred vision, one-sided weakness or slurred speech  1. What are your last 5 BP readings?  150/77 this morning 97/57 last night   2. Are you having any other symptoms (ex. Dizziness, headache, blurred vision, passed out)? Dizzy, almost passed out   3. What is your BP issue? Pt was told not to take his medication if his pulse was till under 60. He wants to make sure Dr. Harrington Challenger still would like him to follow those ordrs

## 2019-02-09 NOTE — Telephone Encounter (Signed)
Spent 30 min on this phone call with patient.  Cholesterol medicine started making me swimmy-headed and sick.   Stopped taking it and symptoms went away.  Did this twice.  Feels better without it.  Been 5-6 days since he has not taken it.  Advised that I would inform Dr. Harrington Challenger of this. Adv not to take any more rosuvastatin until he hears back from [place].  He has never taken any other medications for cholesterol.  Pt appreciative for this information.   Also - yesterday evening after getting off mower he was weak and felt like he might pass out. BP was  97/57 Has not been drinking a lot and has been having diarrhea.  Trying to drink fluids.  Today feels better.  BP is 124/73 HR 56. Wanted to speak about considering not taking atenolol as needed.    I recommended for now he continue the 12.5 mg once a day as he has been doing, and take the 2nd tablet at bedtime - as needed for palpitations, heart pounding, as long as HR is 60 or above. Scheduled follow up in office with Dr. Harrington Challenger for Nov 13

## 2019-02-12 NOTE — Telephone Encounter (Signed)
Pt should be on something to lower cholesterol given coronary artery disease Not tolerant of prastatin and now rosuvastatin Would reocmm trial of lipitor 10 mg   Metabolized very different from the other two Follow up lipids in 8 wks with AST

## 2019-02-13 ENCOUNTER — Telehealth: Payer: Self-pay | Admitting: Internal Medicine

## 2019-02-13 NOTE — Telephone Encounter (Signed)
Cardiology Moonlighter Note  Returned page from patient. States he's having "heart skipping." BP 134/80. Pulse 41. Feels that he's having normal beat followed by abnormal beat. Has been taking prednisone lately which he thinks made his symptoms worse. Has been taking atenolol twice daily 12.5mg . Wants to take another atenolol. Just finished 30 day rhythm monitor, but hasn't been read yet. Pt denies symptoms of dizziness, lightheadedness, chest pain, SOB, syncope, presyncope. Just feeling palpitations. Mildly bothersome to him.   During the phone call, patient stated his heart rhythm returned to normal. Now feeling well.   I offered the patient reassurance. Will be helpful to see the result of his rhythm monitor that he recently wore. Advised against taking an extra dose of atenolol given his bradycardia (although it sounds like patient is having bigeminy and thus atenolol may be helpful). Will send message to patient's cardiologist, Dr. Harrington Challenger.  Marcie Mowers, MD Cardiology Fellow, PGY-7

## 2019-02-15 ENCOUNTER — Other Ambulatory Visit: Payer: Self-pay

## 2019-02-15 NOTE — Telephone Encounter (Signed)
Left message to call back  

## 2019-02-16 MED ORDER — ATORVASTATIN CALCIUM 10 MG PO TABS: 10.0000 mg | ORAL_TABLET | Freq: Every day | ORAL | 6 refills | Status: DC

## 2019-02-16 NOTE — Telephone Encounter (Signed)
Patient called back returning [name]'s call from yesterday

## 2019-02-16 NOTE — Telephone Encounter (Signed)
Pt has been notified of recommendation per Dr. Harrington Challenger to change over to Lipitor 10 mg daily. Explained to the pt that Lipitor metabolizes differently than the Pravastatin and Crestor that he has tried. Pt is agreeable to lab work to be done in 8 weeks 04/13/19. Pt aware to come in fasting. Pt did also ask about his heart monitor, said he sent it back Monday. I assured the pt that the monitor will need to be uploaded for Dr. Harrington Challenger to read. Assured the pt that once monitor is read we will call him with those results. Pt also said he has a tooth that he needs to have out as well. I assured the pt that I will let  Dr. Harrington Challenger know of our conversation today. Pt thanked me for the call. I have sent in Lipitor 10 mg [facility] to [facility] in [place] and labs ordered and scheduled for 04/13/19. Patient notified of result.  Please refer to phone note from today for complete details.   Julaine Hua, [facility] 02/16/2019 10:33 AM

## 2019-02-21 ENCOUNTER — Other Ambulatory Visit: Payer: Self-pay

## 2019-02-21 ENCOUNTER — Telehealth: Payer: Self-pay | Admitting: Internal Medicine

## 2019-02-21 MED ORDER — DILTIAZEM HCL 30 MG PO TABS: 30.0000 mg | ORAL_TABLET | Freq: Two times a day (BID) | ORAL | 1 refills | Status: DC

## 2019-02-21 NOTE — Progress Notes (Signed)
Spoke with pt regarding his heart monitor results, discussed starting Diltiazem. Pt agreeable, new rx sent to [facility] in [place]. Pt understands to call back if any new symptoms present.   Pt is wondering if it would be okay for him to take 20mg  Sildenafil PRN. He is concerned it may cause issues with his heart.   Pt is having dental work done that requires him to be under anesthesia and wants to ask Dr. Harrington Challenger what the risks are regarding this and his cardiac history?   Routing to Dr. Harrington Challenger for advisement

## 2019-02-21 NOTE — Progress Notes (Signed)
Patient is OK to occasionally use sildenafil   It is a vasodilator   May lower BP   Needs to be careful not dizzy He cannot take nitroglycerin with 24 hours prior or after     Regarding dental work--  OK to proceed.

## 2019-02-21 NOTE — Telephone Encounter (Signed)
New Message    Patient states he had a heart monitor test done and doesn't understand the results.  Needs a nurse to call him to explain it better.

## 2019-02-27 ENCOUNTER — Telehealth: Payer: Self-pay | Admitting: Internal Medicine

## 2019-02-27 NOTE — Telephone Encounter (Signed)
New message   1. What dental office are you calling from? Dr. Lenard Simmer, DDS  2. What is your office phone number? [phone]  3. What is your fax number?[phone]  4. What type of procedure is the patient having performed? Tooth extraction and filling  5. What date is procedure scheduled or is the patient there now? TBD (if the patient is at the dentist's office question goes to their cardiologist if he/she is in the office.  If not, question should go to the DOD).   6. What is your question (ex. Antibiotics prior to procedure, holding medication-we need to know how long dentist wants pt to hold med)?Does Dr. Harrington Challenger think he needs to be premedicated and does he need to come off any medications before procedure. Is it best to send to an oral surgeon? Please advise.

## 2019-02-28 NOTE — Telephone Encounter (Signed)
   Primary Cardiologist: Dorris Carnes, MD  Chart reviewed as part of pre-operative protocol coverage. Simple dental extractions are considered low risk procedures per guidelines and generally do not require any specific cardiac clearance. It is also generally accepted that for simple extractions and dental cleanings, there is no need to interrupt blood thinner therapy.  SBE prophylaxis is NOT required for the patient.  PLAN:  1. The patient may proceed with tooth extraction and filling without further testing. 2. No medication changes, adjustments needed.  Richardson Dopp, PA-C 02/28/2019, 11:52 AM

## 2019-02-28 NOTE — Telephone Encounter (Signed)
Clearance has been received.

## 2019-02-28 NOTE — Telephone Encounter (Signed)
   Call back staff: Marland Kitchen This phone note has been faxed to the requesting dentist. . Please contact the dentist's office to ensure it has been received. . This phone note will be removed from the preop pool.  Richardson Dopp, PA-C    02/28/2019 11:56 AM

## 2019-03-05 ENCOUNTER — Telehealth: Payer: Self-pay | Admitting: Internal Medicine

## 2019-03-05 NOTE — Telephone Encounter (Signed)
Looks like PCP ordered Pantoprazole.

## 2019-03-05 NOTE — Telephone Encounter (Signed)
New Message    Pt c/o medication issue:  1. Name of Medication: Pantoprozole 40mg   2. How are you currently taking this medication (dosage and times per day)? 1 tablet by mouth twice daily   3. Are you having a reaction (difficulty breathing--STAT)? No  4. What is your medication issue? Patient states it's making his heart skip and he feels swimmy headed.  Please call patient back to discuss.

## 2019-03-08 NOTE — Telephone Encounter (Signed)
Called patient back. He is having a pretty good day today.  Just came in from taking out trash.  Checking BP and HR while on the phone. 152/73, 60   He mis spoke when he said pantoprazole, he meant to say that it is the atorvastatin that caused the swimmy headedness and dizzy.   He noticed it with every dose.  Only took for few days. Symptoms have gotten better.  He is going to hold on atorvastatin for now.  I will route to Dr. Harrington Challenger to inform.  He plans to try again in a couple weeks, will start with half tab only.  He had same reaction one day when he took the thyroid medicine.  He is not taking this either.    Also reports that he fell off his mower Monday evening, from leaning to far to the right.   He had big headphones (noise reducers) on and his head bounced on ground.  He feels the noise protectors made him not hit as hard.   He takes 81 mg asa daily.  He contacted the nurse practitioner from insurance company that had been at his house earlier that day. He got up after the fall, finished the yardwork and then went to [employer].    WIll route to Dr. Harrington Challenger as Juluis Rainier

## 2019-03-16 IMAGING — CT CT ABD-PELV W/ CM
2 of 5 series · 15 of 46 positions shown, 17 images · IV contrast (Isovue)
Comparison: CT abdomen and pelvis December 24, 2016

CLINICAL DATA: LEFT upper quadrant pain after eating tenderloin,
biscuits and gravy yesterday morning. Nausea and vomiting beginning
this morning. History of hypertension, bladder cancer, clotting
disorder, immune deficiency.

EXAM:
CT ABDOMEN AND PELVIS WITH CONTRAST
TECHNIQUE: Multidetector CT imaging of the abdomen and pelvis was performed
using the standard protocol following bolus administration of
intravenous contrast.
CONTRAST:  100 cc Ksovue-TJJ

[Series 2: axial st · axial · 0.84mm/px · z∈[-700,-215]mm · 12 of 109 slices shown, 14 images]
[im 6/109  soft-tissue]
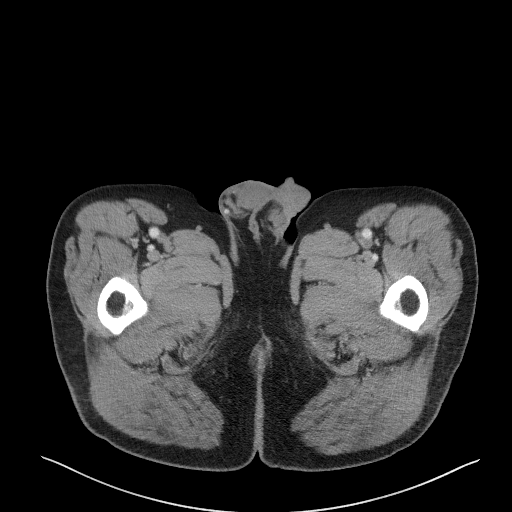
[im 6/109  bone]
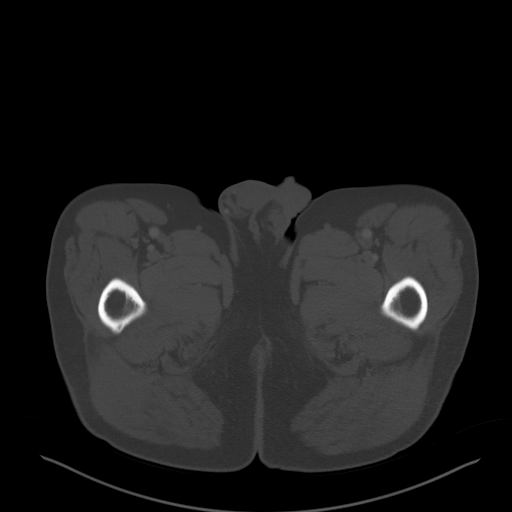
[im 16/109  soft-tissue]
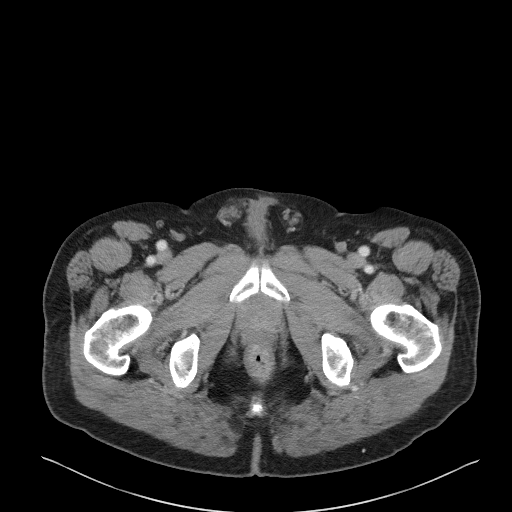
[im 26/109  soft-tissue]
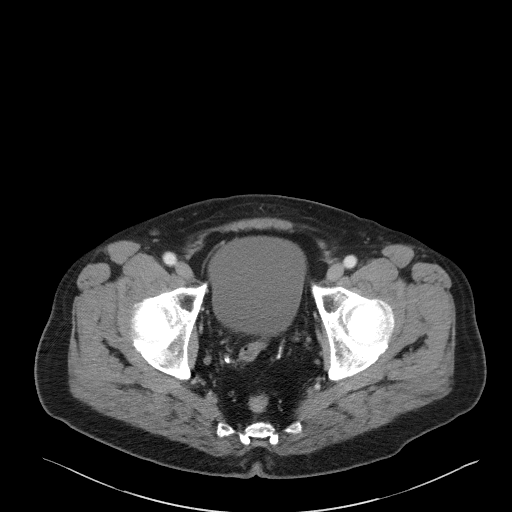
[im 31/109  soft-tissue]
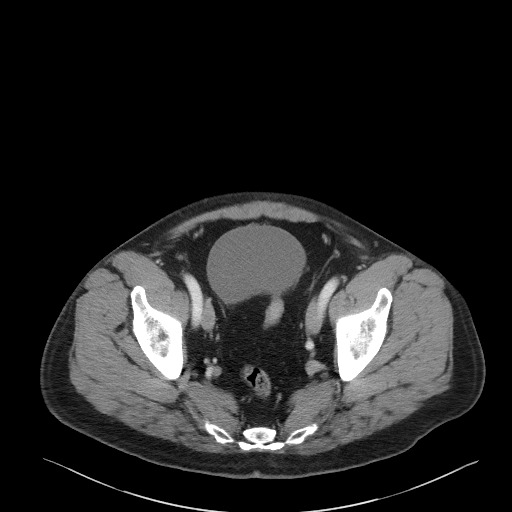
[im 42/109  soft-tissue]
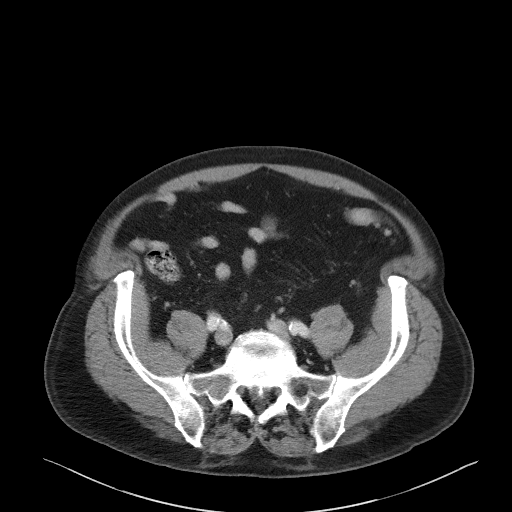
[im 52/109  soft-tissue]
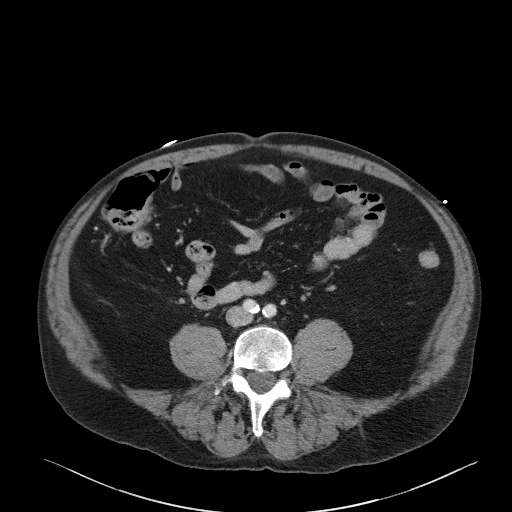
[im 57/109  soft-tissue]
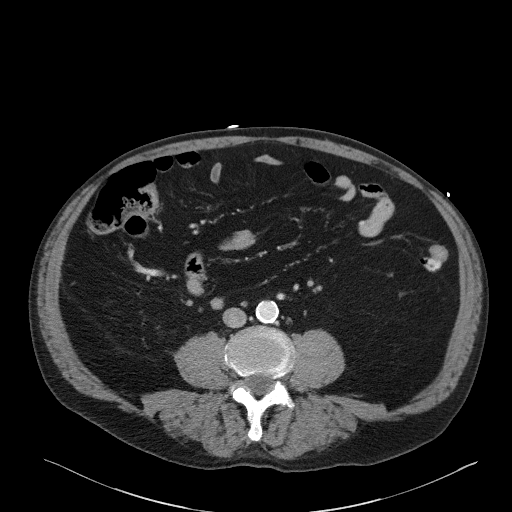
[im 67/109  soft-tissue]
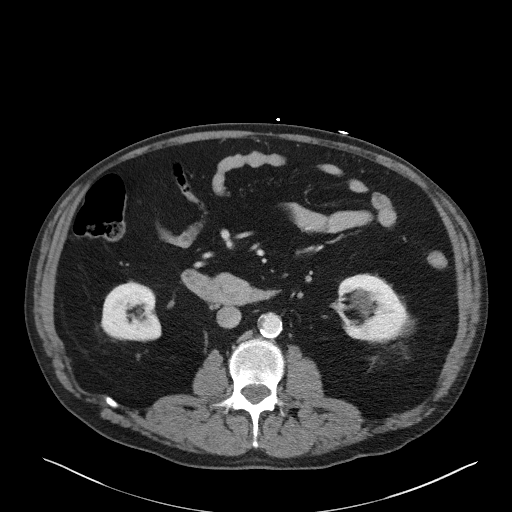
[im 78/109  soft-tissue]
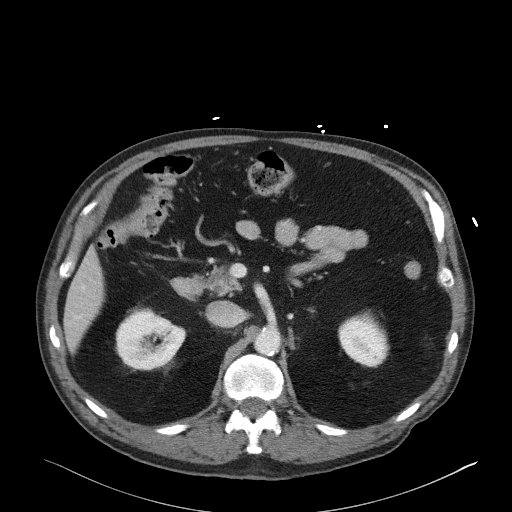
[im 78/109  bone]
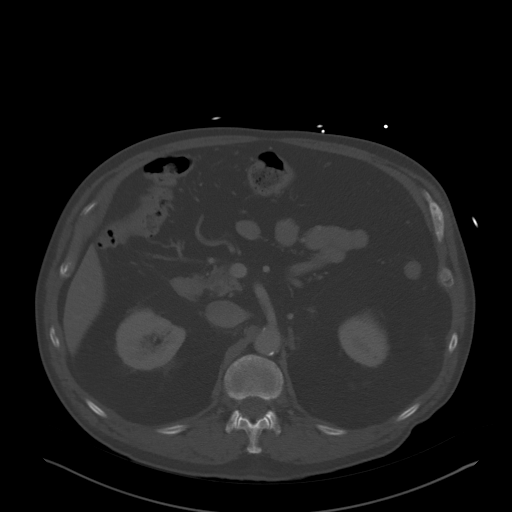
[im 83/109  soft-tissue]
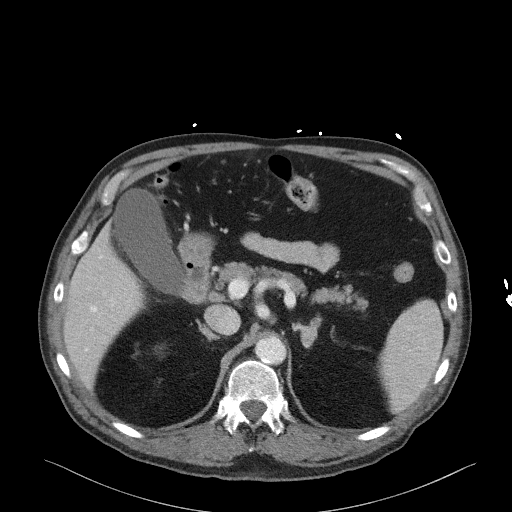
[im 93/109  soft-tissue]
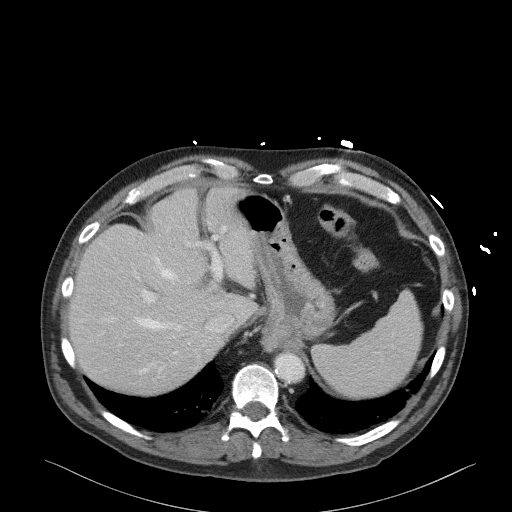
[im 103/109  soft-tissue]
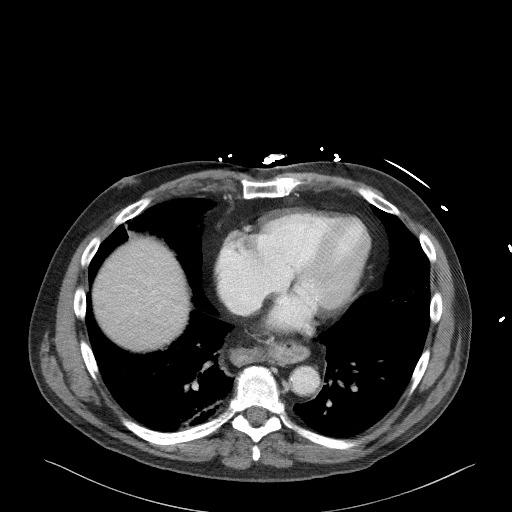

[Series 5: coronal st · coronal · 0.81mm/px · 3 of 106 slices shown]
[im 36/106  soft-tissue]
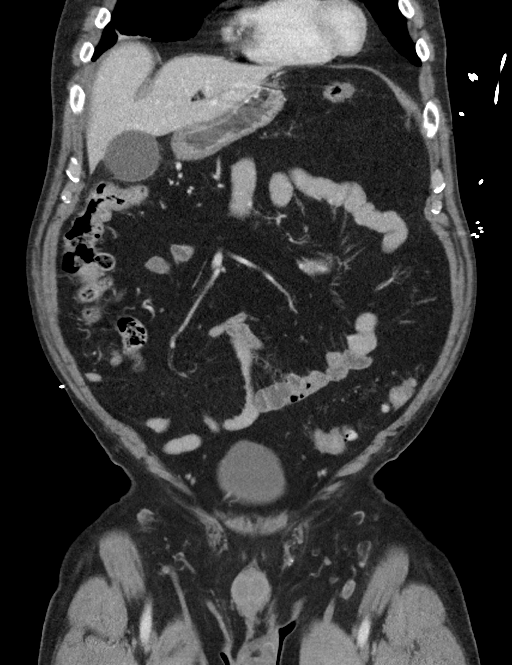
[im 47/106  soft-tissue]
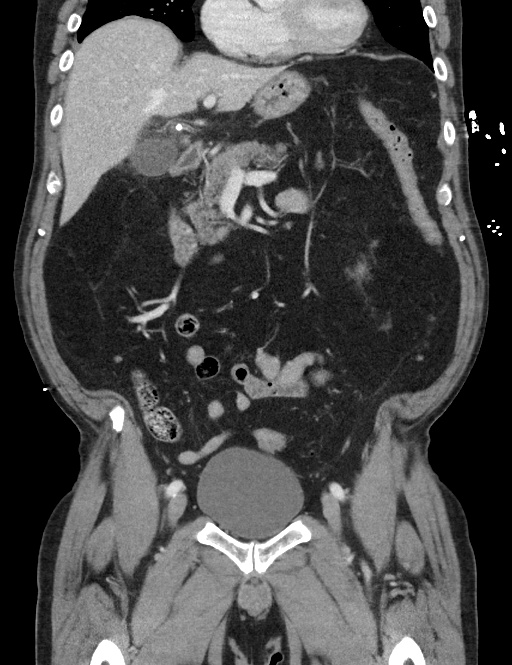
[im 59/106  soft-tissue]
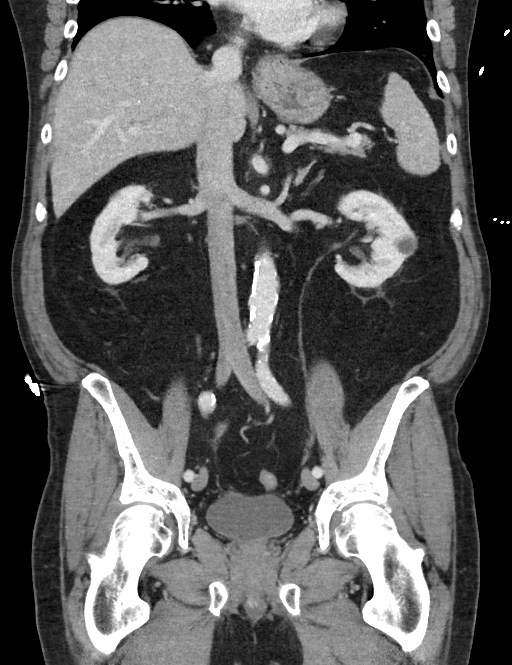

[15 of 46 positions shown; findings below may reference images not displayed]

FINDINGS: LOWER CHEST: Dependent atelectasis. Heart size is normal. No
pericardial effusion.

HEPATOBILIARY: Subcentimeter gallstones at the neck. Mild
gallbladder distension pericholecystic fat stranding. 4 mm gallstone
within the proximal cystic duct. Subcentimeter probable cyst LEFT
lobe of the liver. Trace intrahepatic biliary dilatation.

PANCREAS: Normal.

SPLEEN: Normal.

ADRENALS/URINARY TRACT: Kidneys are orthotopic, demonstrating
symmetric enhancement. No nephrolithiasis, hydronephrosis or solid
renal masses. Too small to characterize hypodensities in the kidneys
bilaterally. 2.5 cm homogeneously hypodense LEFT lower pole renal
cyst. Punctate LEFT lower pole nephrolithiasis. The unopacified
ureters are normal in course and caliber. Delayed imaging through
the kidneys demonstrates symmetric prompt contrast excretion within
the proximal urinary collecting system. Urinary bladder is partially
distended and unremarkable. 19 mm LEFT adrenal mass, previously
characterized as benign adenoma by noncontrast CT.

STOMACH/BOWEL: Small hiatal hernia. The stomach, small and large
bowel are normal in course and caliber without inflammatory changes.
Mild LEFT colon diverticulosis. Normal appendix.

VASCULAR/LYMPHATIC: Ectatic infrarenal aorta without aneurysm.
Moderate to severe calcific atherosclerosis No lymphadenopathy by CT
size criteria.

REPRODUCTIVE: Prostatic calcifications without prostatomegaly.

OTHER: No intraperitoneal free fluid or free air.

MUSCULOSKELETAL: Nonacute. Mild degenerative change of the hips.
Moderate L4-5 and moderate to severe L5-S1 degenerative discs.
IMPRESSION: Cholelithiasis and suspected acute cholecystitis. 4 mm stone within
the proximal cystic duct. Trace intrahepatic biliary dilatation.

Punctate nonobstructing LEFT nephrolithiasis.

## 2019-03-18 ENCOUNTER — Other Ambulatory Visit: Payer: Self-pay | Admitting: Family Medicine

## 2019-03-21 IMAGING — CR DG CHEST 1V PORT
1 series · 1 of 1 positions shown · non-contrast
Comparison: Chest x-rays dated 12/02/2016, 11/29/2016 and
09/21/2016 and CT scan of the abdomen dated 11/29/2016

CLINICAL DATA: Pleural effusion.

EXAM:
PORTABLE CHEST 1 VIEW

[portable]
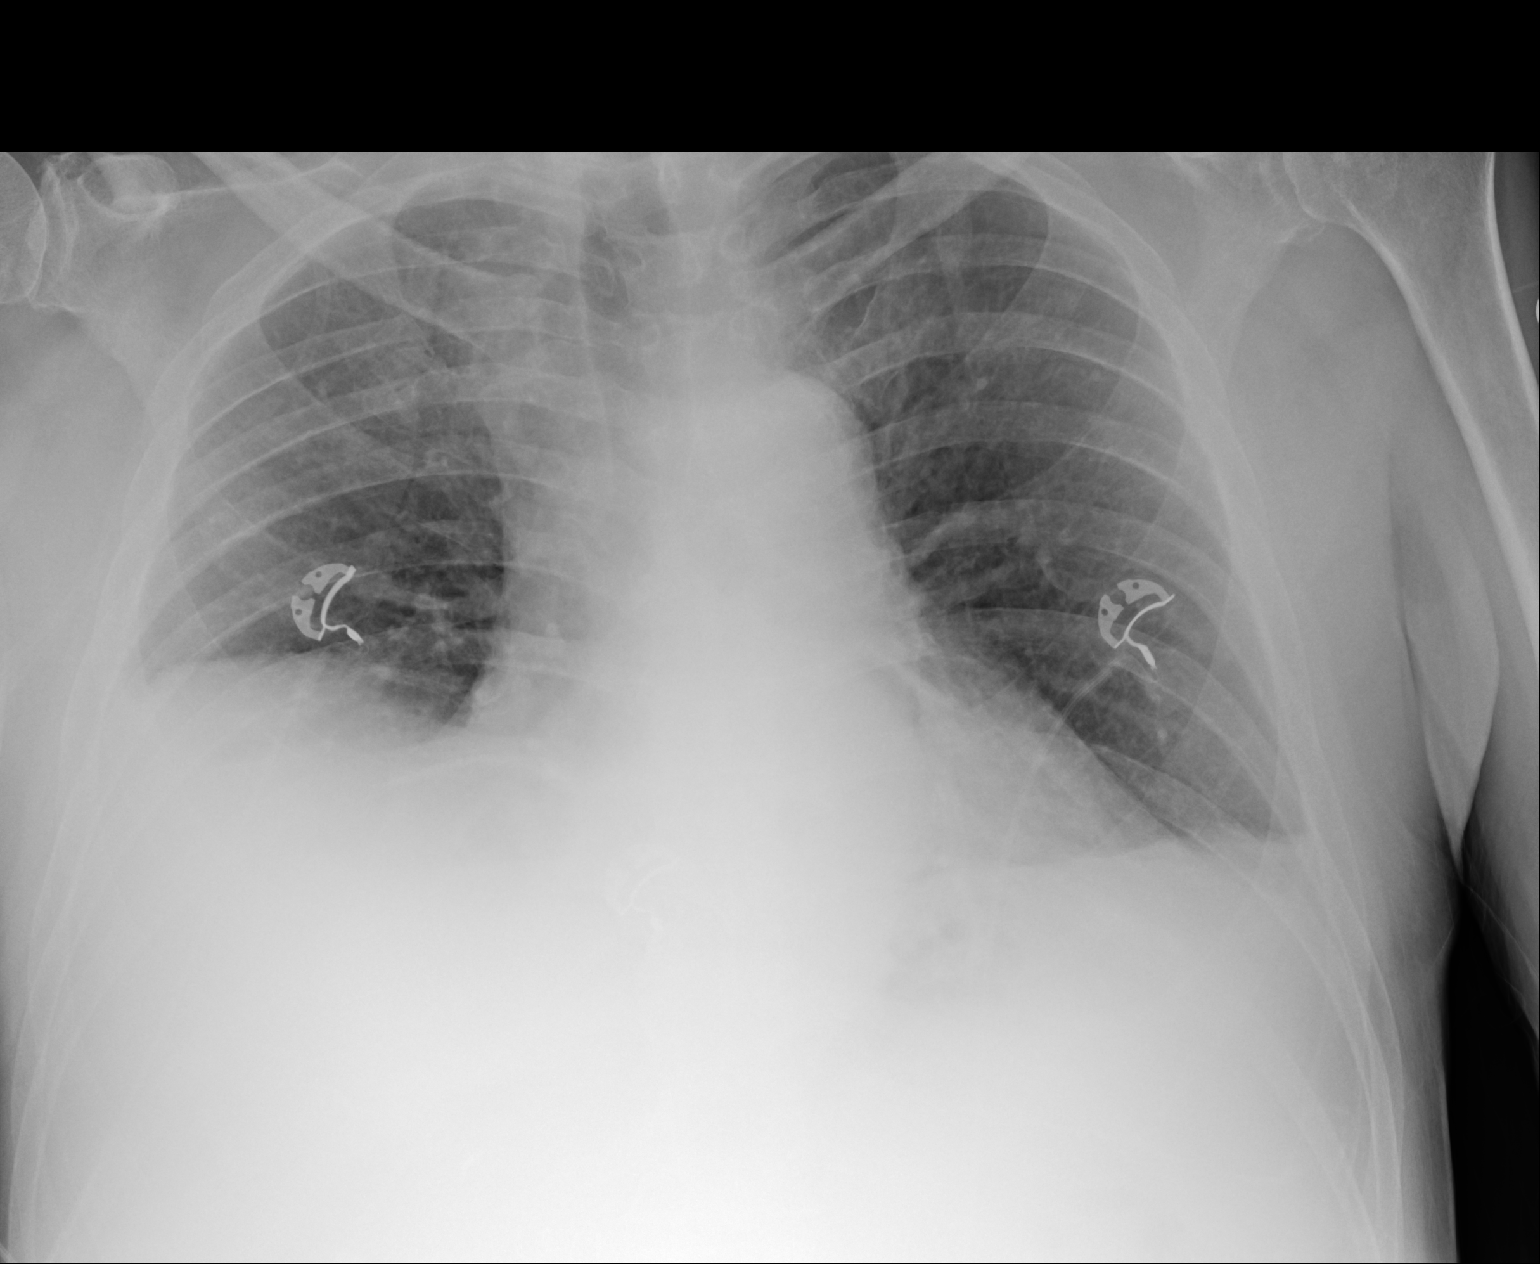

[1 of 1 positions shown; findings below may reference images not displayed]

FINDINGS: The heart size and pulmonary vascularity are normal. The patient has
a known moderate to large hiatal hernia.

There is improved aeration at the lung bases with small residual
pleural effusions/atelectasis, right more than left.
IMPRESSION: Improved aeration at the lung bases. Slight residual effusions/
atelectasis at the bases.

## 2019-03-25 ENCOUNTER — Other Ambulatory Visit: Payer: Self-pay | Admitting: Family Medicine

## 2019-04-05 ENCOUNTER — Encounter: Payer: Self-pay | Admitting: Family Medicine

## 2019-04-05 ENCOUNTER — Ambulatory Visit (INDEPENDENT_AMBULATORY_CARE_PROVIDER_SITE_OTHER): Payer: PPO | Admitting: Family Medicine

## 2019-04-05 DIAGNOSIS — K5792 Diverticulitis of intestine, part unspecified, without perforation or abscess without bleeding: Secondary | ICD-10-CM | POA: Diagnosis not present

## 2019-04-05 MED ORDER — METRONIDAZOLE 500 MG PO TABS: 500.0000 mg | ORAL_TABLET | Freq: Two times a day (BID) | ORAL | 0 refills | Status: DC

## 2019-04-05 MED ORDER — DOXYCYCLINE HYCLATE 100 MG PO CAPS: 100.0000 mg | ORAL_CAPSULE | Freq: Two times a day (BID) | ORAL | 0 refills | Status: DC

## 2019-04-05 MED ORDER — DIPHENOXYLATE-ATROPINE 2.5-0.025 MG PO TABS: 2.0000 | ORAL_TABLET | Freq: Four times a day (QID) | ORAL | 0 refills | Status: DC | PRN

## 2019-04-05 NOTE — Progress Notes (Addendum)
Subjective:  Patient ID: Ronald Morrow., male    DOB: [birth year]  Age: 84 y.o. MRN: [id number]   HPI [place] presents for LLQ pain. Noted over 3-4 days. Cramping. Having 3 BM per day. First is normal the next two are runny. Denies melena, hematochezia. Says this is just like the prevcious episodes of diverticulitis. Denies fever. No cough. No UR sx.  Depression screen [facility] 2/9 11/02/2018 09/28/2018 05/29/2018  Decreased Interest 0 0 0  Down, Depressed, Hopeless 0 0 0  PHQ - 2 Score 0 0 0  Altered sleeping - 0 -  Tired, decreased energy - 0 -  Change in appetite - 0 -  Feeling bad or failure about yourself  - 0 -  Trouble concentrating - 0 -  Moving slowly or fidgety/restless - 0 -  Suicidal thoughts - 0 -  PHQ-9 Score - 0 -  Difficult doing work/chores - Not difficult at all -  Some recent data might be hidden    History Ronald Russell has a past medical history of Allergy, Anxiety, Bladder cancer ([place]) (2010), CAD (coronary artery disease), Chronic bronchitis ([place]), GERD (gastroesophageal reflux disease), History of blood transfusion (1949), HOH (hard of hearing), Hypertension, Ocular migraine, Pneumonia, Seasonal allergies, Shingles, Temporal arteritis ([place]), and Thrombocytopenia ([place]) (11/29/2016).   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   His family history includes Alzheimer's disease in his father; Cancer in his mother, sister, and sister.He reports that he has quit smoking. His smoking use included cigarettes. He has a 2.25 pack-year smoking history. He  has never used smokeless tobacco. He reports that he does not drink alcohol or use drugs.    ROS Review of Systems  Constitutional: Negative.   HENT: Negative.   Eyes: Negative for visual disturbance.  Respiratory: Negative for cough and shortness of breath.   Cardiovascular: Negative for chest pain and leg swelling.  Gastrointestinal: Positive for abdominal pain and diarrhea. Negative for nausea and vomiting.  Genitourinary: Negative for difficulty urinating.  Musculoskeletal: Negative for arthralgias and myalgias.  Skin: Negative for rash.  Neurological: Negative for headaches.  Psychiatric/Behavioral: Negative for sleep disturbance.    Objective:  There were no vitals taken for this visit.  BP Readings from Last 3 Encounters:  01/09/19 109/70  12/30/18 (!) 147/68  10/20/18 126/67    Wt Readings from Last 3 Encounters:  01/09/19 201 lb (91.2 kg)  10/20/18 195 lb (88.5 kg)  09/28/18 205 lb (93 kg)     Physical Exam  Exam deferred. Pt. Ronald Russell due to COVID 19. Phone visit performed.   Assessment & Plan:   Diagnoses and all orders for this visit:  Diverticulitis  Other orders -     diphenoxylate-atropine (LOMOTIL) 2.5-0.025 MG tablet; Take 2 tablets by mouth 4 (four) times daily as needed for diarrhea or loose stools. -     metroNIDAZOLE (FLAGYL) 500 MG tablet; Take 1 tablet (500 mg total) by mouth 2 (two) times daily. -     doxycycline (VIBRAMYCIN) 100 MG capsule; Take 1 capsule (100 mg total) by mouth 2 (two) times daily.       I have changed Ronald Moll C. Carriveau Sr.'s doxycycline.  I am also having him start on diphenoxylate-atropine and metroNIDAZOLE. Additionally, I am having him maintain his aspirin EC, CALCIUM-MAGNESIUM-VITAMIN D PO, Potassium Gluconate, fluticasone, fluocinonide cream, acetaminophen, atenolol, levocetirizine, meclizine, midodrine, levothyroxine, atorvastatin, diltiazem, pantoprazole, and sildenafil.  Allergies as of 04/05/2019       Reactions   Uloric [febuxostat] Palpitations, Other (See Comments), Hypertension   Hypertension with palpitations and a sense of fuzziness and dizziness at the left side of his head   Doxazosin Diarrhea   Augmentin [amoxicillin-pot Clavulanate] Diarrhea   Ciprofloxacin Diarrhea   Codeine Nausea And Vomiting, Other (See Comments)   Severe headache   Levaquin [levofloxacin] Diarrhea   Oxycodone Nausea And Vomiting   Tramadol Nausea And Vomiting   Vicodin [hydrocodone-acetaminophen] Nausea And Vomiting      Medication List       Accurate as of April 05, 2019  1:23 PM. If you have any questions, ask your nurse or doctor.        acetaminophen 650 MG CR tablet Commonly known as: TYLENOL Take 650 mg by mouth every 6 (six) hours as needed for pain.   aspirin EC 81 MG tablet Take 1 tablet (81 mg total) by mouth daily.   atenolol 25 MG tablet Commonly known as: TENORMIN Take 0.5 tablets (12.5 mg total) by mouth as needed. MAY TAKE 12.5 TO 25 MG AS NEEDED   atorvastatin 10 MG tablet Commonly known as: LIPITOR Take 1 tablet (10 mg total) by mouth daily.   CALCIUM-MAGNESIUM-VITAMIN D PO Take 2 tablets by mouth at bedtime.   diltiazem 30 MG tablet Commonly known as: Cardizem Take 1 tablet (30 mg total) by mouth 2 (two) times daily.   diphenoxylate-atropine 2.5-0.025 MG tablet Commonly known as: Lomotil Take 2 tablets by mouth 4 (four) times daily as needed for diarrhea or loose stools. Started by: Claretta Fraise, MD   doxycycline 100 MG capsule Commonly known as: VIBRAMYCIN Take 1 capsule (100 mg total) by mouth 2 (two) times daily. Started by: Claretta Fraise, MD   fluocinonide cream 0.05 % Commonly known as: LIDEX Apply topically 2 (two) times daily.   fluticasone 50 MCG/ACT nasal spray Commonly known as: FLONASE Place 2 sprays into both nostrils at bedtime.   levocetirizine 5 MG tablet Commonly known as: Xyzal Allergy 24HR Take 1 tablet (5 mg total) by mouth every  evening.   levothyroxine 50 MCG tablet Commonly known as: SYNTHROID Take 1 tablet (50 mcg total) by mouth daily.   meclizine 25 MG tablet Commonly known as: ANTIVERT Take 1 tablet (25 mg total) by mouth 3 (three) times daily as needed for dizziness.   metroNIDAZOLE 500 MG tablet Commonly known as: FLAGYL Take 1 tablet (500 mg total) by mouth 2 (two) times daily. Started by: Claretta Fraise, MD   midodrine 2.5 MG tablet Commonly known as: PROAMATINE Take 1 tablet (2.5 mg total) by mouth 2 (two) times a day. Take at 8 am and 2 pm   pantoprazole 40 MG tablet Commonly known as: PROTONIX TAKE 1 TABLET BY MOUTH TWICE A DAY   Potassium Gluconate 550 MG Tabs Take 1 tablet (550 mg total) by mouth at bedtime. Need to take 1 tab one day and 2 the next day alternating   sildenafil 20 MG tablet Commonly known as: REVATIO TAKE 1 TABLET (20 MG TOTAL) BY MOUTH DAILY AS NEEDED (2-5 AS NEEDED).      Virtual Visit via telephone Note  I discussed the limitations, risks, security and privacy concerns of performing an  evaluation and management service by telephone and the availability of in person appointments. I also discussed with the patient that there may be a patient responsible charge related to this service. The patient expressed understanding and agreed to proceed. Pt. Is at home. Dr. Livia Snellen is in his office.  Follow Up Instructions:   I discussed the assessment and treatment plan with the patient. The patient was provided an opportunity to ask questions and all were answered. The patient agreed with the plan and demonstrated an understanding of the instructions.   The patient was advised to call back or seek an in-person evaluation if the symptoms worsen or if the condition fails to improve as anticipated.  Total minutes including chart review and phone contact time: 15   Follow-up: Return in about 2 weeks (around 04/19/2019).  Claretta Fraise, M.D.

## 2019-04-05 NOTE — Addendum Note (Signed)
Addended by: Claretta Fraise on: 04/05/2019 01:23 PM   Modules accepted: Orders

## 2019-04-09 ENCOUNTER — Other Ambulatory Visit: Payer: Self-pay | Admitting: Family Medicine

## 2019-04-13 ENCOUNTER — Other Ambulatory Visit: Payer: Self-pay

## 2019-04-13 ENCOUNTER — Other Ambulatory Visit: Payer: PPO

## 2019-04-13 ENCOUNTER — Other Ambulatory Visit: Payer: PPO | Admitting: *Deleted

## 2019-04-13 DIAGNOSIS — I2510 Atherosclerotic heart disease of native coronary artery without angina pectoris: Secondary | ICD-10-CM

## 2019-04-13 DIAGNOSIS — E782 Mixed hyperlipidemia: Secondary | ICD-10-CM

## 2019-04-14 LAB — LIPID PANEL
Chol/HDL Ratio: 4.7 ratio (ref 0.0–5.0)
Cholesterol, Total: 160 mg/dL (ref 100–199)
HDL: 34 mg/dL — ABNORMAL LOW (ref >39)
LDL Chol Calc (NIH): 97 mg/dL (ref 0–99)
Triglycerides: 165 mg/dL — ABNORMAL HIGH (ref 0–149)
VLDL Cholesterol Cal: 29 mg/dL (ref 5–40)

## 2019-04-14 LAB — ALT: ALT: 11 IU/L (ref 0–44)

## 2019-04-16 ENCOUNTER — Telehealth: Payer: Self-pay | Admitting: Internal Medicine

## 2019-04-16 NOTE — Telephone Encounter (Signed)
I spoke to the patient who is calling because his heart is "skipping" and it seems like it is becoming more frequent.  His BP has been fluctuating between 118/64 to 140/74.  HR has been in the 60s.  He denies CP or SOB.    The only heart medication that he is taking is Atenolol and Potassium.  He stopped all others because it made him feel "swimmy" headed and it has now stopped.  He has an appointment with Dr Harrington Challenger on 11/13, but wanted to know if he could be seen sooner.  Please advise, thank you

## 2019-04-16 NOTE — Telephone Encounter (Signed)
Patient c/o Palpitations:  High priority if patient c/o lightheadedness, shortness of breath, or chest pain  1) How long have you had palpitations/irregular HR/ Afib? Are you having the symptoms now? Off and on for the month of October and yes the symptoms are happening right now  2) Are you currently experiencing lightheadedness, SOB or CP? lightheadedness  3) Do you have a history of afib (atrial fibrillation) or irregular heart rhythm? yes  4) Have you checked your BP or HR? (document readings if available): yes today (04/16/19) BP 118/64 HR 65  5) Are you experiencing any other symptoms? No

## 2019-04-16 NOTE — Telephone Encounter (Signed)
-----   Message from Fay Records, MD sent at 04/14/2019  4:47 PM EDT ----- LDL is 97    Would go up on lipitor to 20 mg    WIth atherosclerosis of aorta want tighter control of LDL F/U lipids and AST in 8 wks

## 2019-04-16 NOTE — Telephone Encounter (Signed)
Called patient to review lab results. He states he cannot take atorvastatin because it makes him "swimmy headed." I asked about other cholesterol medications he has taken in the past and he states he has stopped taking all of his medications currently except atenolol and potassium. He asks me if I saw the phone call from this morning and wants to know what Dr. Harrington Challenger said about his heart skipping around. I reviewed the call and his recent monitor report and explained the reason Dr. Harrington Challenger prescribed diltiazem 30 mg BID. I encouraged him to try taking the diltiazem a few hours after he takes atenolol to see how he feels. He is fearful of the diltiazem making him swimmy-headed. I asked him about hydration and nutrition and he reports difficulty with certain foods due to a bad colon and gallbladder. Today he has eaten a bowl of oatmeal, a grilled cheese and a ham sandwich.  I spent > 20 minutes talking with him and answering his questions. He agrees to start diltiazem 30 mg twice daily at times when he is not taking atenolol (which he takes first thing in the morning and at bedtime). I advised him to try to increase water consumption to 64 oz daily (he states he can only drink 50 oz) and to increase caloric intake, especially lean protein. He will discuss cholesterol management at his appointment with Dr. Harrington Challenger next week. I advised him to call back prior to his appointment with questions or concerns and he thanked me for the call.

## 2019-04-21 ENCOUNTER — Telehealth: Payer: Self-pay | Admitting: Physician Assistant

## 2019-04-21 NOTE — Telephone Encounter (Signed)
   The patient called the answering service after-hours today. Numerous phone notes reviewed, patient very sensitive to medication adjustments. Event monitor 02/15/19 showed NSR with occasional PACs, no pathologic arrhythmia. Diltiazem 30mg  BID was added to his atenolol 12.5mg  BID. He has only been taking the dilitazem once daily and it has not helped with his skips so he increased it to BID. His heart has not skipped yet today but HR is now 48bpm. This caused him to become quite nervous, feeling week and like the blood flow is all out of his arms. When asked if he is feeling bad enough to seek care he said, "no maam, it's not that bad, I'm just worried about that number." I advised he hold off on the diltiazem altogether for now. He agrees with this plan. Warning sx reviewed. Will route to Dr. Harrington Challenger. The pt has an appointment with her on 04/27/19 and plans to discuss further plans at that time.  Charlie Pitter PA-C

## 2019-04-23 ENCOUNTER — Telehealth: Payer: Self-pay | Admitting: Internal Medicine

## 2019-04-23 NOTE — Telephone Encounter (Signed)
Called patient. He noticed his heart skipping several times after walking around his yard today.  Since then it has settled down.  I adv to take his atenolol a little early this evening but do not take an extra dose at this time.  His HR is 55 at rest and bumps up into 60s when he is up moving around. He has a follow up with Dr. Harrington Challenger on 04/27/19.

## 2019-04-23 NOTE — Telephone Encounter (Signed)
Patient c/o Palpitations:  High priority if patient c/o lightheadedness, shortness of breath, or chest pain  1) How long have you had palpitations/irregular HR/ Afib? Are you having the symptoms now? Patient has irregular HR  2) Are you currently experiencing lightheadedness, SOB or CP? no  3) Do you have a history of afib (atrial fibrillation) or irregular heart rhythm? Yes, irregular HR  4) Have you checked your BP or HR? (document readings if available): BP 124/68 HR 55  5) Are you experiencing any other symptoms? Patient wants to know if he can take another atenolol (TENORMIN) 25 MG tablet. He took 1/2 tablet early today.

## 2019-04-24 MED ORDER — ATENOLOL 25 MG PO TABS: 12.5000 mg | ORAL_TABLET | Freq: Every day | ORAL | 3 refills | Status: DC

## 2019-04-24 MED ORDER — DILTIAZEM HCL 30 MG PO TABS: 30.0000 mg | ORAL_TABLET | Freq: Every day | ORAL | 6 refills | Status: DC

## 2019-04-24 NOTE — Addendum Note (Signed)
Addended by: Antonieta Iba on: 04/24/2019 01:08 PM   Modules accepted: Orders

## 2019-04-24 NOTE — Telephone Encounter (Signed)
See accomp noted

## 2019-04-24 NOTE — Telephone Encounter (Signed)
REviewed note /discussion with D Dunn HR at 48 does not alarm if he is feeling OK  The diltiazem may be better at minimizing skips Would the pull back on atenolol some  Could :   Take atenolol 12.5 at night only   Then take 30 diltiazem in am  Follow symptoms, BP / HR

## 2019-04-24 NOTE — Telephone Encounter (Signed)
Fay Records, MD at 04/24/2019 7:52 AM  Status: Signed    REviewed note /discussion with D Dunn HR at 48 does not alarm if he is feeling OK  The diltiazem may be better at minimizing skips Would the pull back on atenolol some  Could :   Take atenolol 12.5 at night only   Then take 30 diltiazem in am  Follow symptoms, BP / HR

## 2019-04-24 NOTE — Telephone Encounter (Signed)
Called patient back with Dr. Alan Ripper recommendation to take diltiazem 30 mg in the morning and atenolol 12.5 mg at night. He did state that after he took his atenolol this morning that he was feeling better and he was not feeling his heart skip. Patient agrees to the plan and will see Dr. Harrington Challenger in the office on Friday 11/13.

## 2019-04-27 ENCOUNTER — Ambulatory Visit: Payer: PPO | Admitting: Internal Medicine

## 2019-04-27 ENCOUNTER — Encounter: Payer: Self-pay | Admitting: Internal Medicine

## 2019-04-27 ENCOUNTER — Other Ambulatory Visit: Payer: Self-pay

## 2019-04-27 VITALS — BP 120/60 | HR 65 | Ht 72.0 in | Wt 204.4 lb

## 2019-04-27 DIAGNOSIS — R002 Palpitations: Secondary | ICD-10-CM | POA: Diagnosis not present

## 2019-04-27 DIAGNOSIS — E559 Vitamin D deficiency, unspecified: Secondary | ICD-10-CM

## 2019-04-27 MED ORDER — DILTIAZEM HCL 30 MG PO TABS: 30.0000 mg | ORAL_TABLET | Freq: Two times a day (BID) | ORAL | 6 refills | Status: DC

## 2019-04-27 NOTE — Progress Notes (Signed)
Cardiology Office Note:    Date:  04/27/2019   ID:  Ronald Fear Sr., DOB Dec 24, 1934, MRN [id number]  PCP:  Claretta Fraise, MD  Cardiologist:  Dorris Carnes, MD   Electrophysiologist:  None   Referring MD: Claretta Fraise, MD   F/U of CAD     History of Present Illness:    Ronald Pavich. is a 84 y.o. male with coronary artery disease status post CABG in June 6811 complicated by postoperative atrial fibrillation  bladder cancer, temporal arteritis, hypertension, hyperlipidemia.   The patient has had a long history of palpitations.  Event monitor in the past has demonstrated symptomatic PACs.  He has continued to have worsening palpitations since his bypass surgery.  Multiple medication changes have been made.  69 Hr Holter in 02/2018 demonstrated normal sinus rhythm with avg HR 71, occasional PVCs and PACs.   I last saw the pt in clinic in July 2020   Echo in July 2020 LVEF normal   Event monitor in Aug showed SR with PACs   He has been maintained on atenolol and I added diltiazem    He inncreased to bid but heart rate went back into 40s   He got nervous and pulled back      Prior CV studies:   The following studies were reviewed today:  27 Hr Holter 02/2018 Sinus rhythm   51 to 106 bpm   Average HR 71 bpm Occasional PVC  Occasional PAC Longest pause 1.5 sec Diary entry associated with SR and also SR with  PAC  Pre CABG Dopplers 12/04/17  Final Interpretation: Right Carotid: The extracranial vessels were near-normal with only minimal wall thickening or plaque. Left Carotid: The extracranial vessels were near-normal with only minimal wall thickening or plaque. Vertebrals:  Bilateral vertebral arteries demonstrate antegrade flow. Subclavians: Normal flow hemodynamics were seen in bilateral subclavian arteries. Right Upper Extremity: Doppler waveforms remain within normal limits with compression. Doppler waveforms remain within normal limits with compression. Left Upper Extremity:  Doppler waveforms remain within normal limits with compression. Doppler waveforms remain within normal limits with compression.   Cardiac Catheterization 12/02/17 LAD prox 75, mid 100 (R-L collats); D2 75 LCx prox 80, mid 70 RCA mild disease EF 55-65   Nuclear stress test 11/24/17 Intermediate risk stress nuclear study with a new area of mild ischemia in the distal LAD artery territory. Normal left ventricular regional and global systolic function. EF 85   Event Monitor 08/2016 Sinus rhythm with PACs  Sensed as skips     Echo 09/01/16 EF 60-65, no RWMA, Gr 2 DD   Past Medical History:  Diagnosis Date  . Allergy   . Anxiety   . Bladder cancer ([place]) 2010   Tx with BCG  . CAD (coronary artery disease)    LHC 6/19: pLAD 75, mLAD 100, D2 75; pLCx 23, mLCx 70, EF 55-65 >> s/p CABG // Echo 3/18: EF 60-65, Gr 2 DD  . Chronic bronchitis ([place])    "yearly the last 3 yrs" (02/13/2013)  . GERD (gastroesophageal reflux disease)   . History of blood transfusion 1949   "seeral" (02/13/2013)  . HOH (hard of hearing)   . Hypertension   . Ocular migraine    "not often" (02/13/2013)  . Pneumonia    "last time ~ 2 yr ago; had it before that too" (02/13/2013)  . Seasonal allergies   . Shingles    has neuropathy since it happened in 6/17  . Temporal arteritis (  [place])   . Thrombocytopenia ([place]) 11/29/2016   Surgical Hx: The patient  has a past surgical history that includes Transurethral resection of bladder tumor (2010 X 3); mastoid tumor removed (Right, 1964); Tonsillectomy (1940's); Skin graft (Right, 1949); Colonoscopy; Vasectomy; Artery Biopsy (Left, 01/09/2013); Cardiovascular stress test (07/2011); Cholecystectomy (N/A, 11/30/2016); LEFT HEART CATH AND CORONARY ANGIOGRAPHY (N/A, 12/02/2017); Coronary artery bypass graft (N/A, 12/07/2017); and TEE without cardioversion (N/A, 12/07/2017).   Current Medications: Current Meds  Medication Sig  . acetaminophen (TYLENOL) 650 MG CR tablet Take 650 mg by mouth  every 6 (six) hours as needed for pain.  Marland Kitchen aspirin EC 81 MG tablet Take 1 tablet (81 mg total) by mouth daily.  Marland Kitchen atenolol (TENORMIN) 25 MG tablet Take 0.5 tablets (12.5 mg total) by mouth at bedtime.  Marland Kitchen CALCIUM-MAGNESIUM-VITAMIN D PO Take 2 tablets by mouth at bedtime.   Marland Kitchen diltiazem (CARDIZEM) 30 MG tablet Take 1 tablet (30 mg total) by mouth daily.  . diphenoxylate-atropine (LOMOTIL) 2.5-0.025 MG tablet Take 2 tablets by mouth 4 (four) times daily as needed for diarrhea or loose stools.  Marland Kitchen doxycycline (VIBRAMYCIN) 100 MG capsule Take 1 capsule (100 mg total) by mouth 2 (two) times daily.  . fluocinonide cream (LIDEX) 0.05 % Apply topically 2 (two) times daily.  Marland Kitchen levothyroxine (SYNTHROID) 50 MCG tablet TAKE 1 TABLET BY MOUTH EVERY DAY  . metroNIDAZOLE (FLAGYL) 500 MG tablet Take 1 tablet (500 mg total) by mouth 2 (two) times daily.  . midodrine (PROAMATINE) 2.5 MG tablet Take 1 tablet (2.5 mg total) by mouth 2 (two) times a day. Take at 8 am and 2 pm  . pantoprazole (PROTONIX) 40 MG tablet TAKE 1 TABLET BY MOUTH TWICE A DAY  . Potassium Gluconate 550 MG TABS Take 1 tablet (550 mg total) by mouth at bedtime. Need to take 1 tab one day and 2 the next day alternating  . sildenafil (REVATIO) 20 MG tablet TAKE 1 TABLET (20 MG TOTAL) BY MOUTH DAILY AS NEEDED (2-5 AS NEEDED).     Allergies:   Uloric [febuxostat], Doxazosin, Augmentin [amoxicillin-pot clavulanate], Ciprofloxacin, Codeine, Levaquin [levofloxacin], Oxycodone, Tramadol, and Vicodin [hydrocodone-acetaminophen]   Social History   Tobacco Use  . Smoking status: Former Smoker    Packs/day: 0.75    Years: 3.00    Pack years: 2.25    Types: Cigarettes  . Smokeless tobacco: Never Used  . Tobacco comment: 02/13/2013 "quit smoking age 71"  Substance Use Topics  . Alcohol use: No    Comment: 02/13/2013 "probably a pint of whiskey/wk up til I was probably 84 yr old"  . Drug use: No     Family Hx: The patient's family history includes  Alzheimer's disease in his father; Cancer in his mother, sister, and sister. There is no history of Anemia, Arrhythmia, Asthma, Clotting disorder, Fainting, Heart attack, Heart disease, Heart failure, Hyperlipidemia, Hypertension, or Migraines.  ROS:   Please see the history of present illness.    All other systems reviewed and are negative.   EKGs/Labs/Other Test Reviewed:    EKG:  EKG is not ordered today.    Recent Labs: 09/28/2018: BNP 147.3 01/09/2019: BUN 14; Creatinine, Ser 1.33; Hemoglobin 13.0; Platelets 195; Potassium 4.1; Sodium 141; TSH 5.850 04/13/2019: ALT 11   Recent Lipid Panel Lab Results  Component Value Date/Time   CHOL 160 04/13/2019 02:58 PM   TRIG 165 (H) 04/13/2019 02:58 PM   HDL 34 (L) 04/13/2019 02:58 PM   CHOLHDL 4.7 04/13/2019 02:58 PM  CHOLHDL 3.3 12/02/2017 12:53 AM   LDLCALC 97 04/13/2019 02:58 PM    Physical Exam:    VS:  BP 120/60   Pulse 65   Ht 6' (1.829 m)   Wt 204 lb 6.4 oz (92.7 kg)   SpO2 97%   BMI 27.72 kg/m     Wt Readings from Last 3 Encounters:  04/27/19 204 lb 6.4 oz (92.7 kg)  01/09/19 201 lb (91.2 kg)  10/20/18 195 lb (88.5 kg)    HEENT:   NCAT Neck:   JVP is normal   No bruits LUngs are CTA Cardiac RRR   No signific murmurs  Abd   Supple   No hepatomegaly    Ext   Tr edema     2+ pulses      ASSESSMENT & PLAN:    Coronary artery disease involving native coronary artery of native heart without angina pectoris S/p CABG in 11/2017.   Continue ASA, statin, beta-blocker.  Palpitations Continues to have   With improvement on dilt I would recomm stopping atenolol and trying diltiazem 30 mg bid    WIll review with EP   Mixed hyperlipidemia   Last lipids at end of October   LDL 97   HDL 34   Tri 165       Plan to increase to 20 mg Lipitor   F/U lipids later this winter     HTN  BP is controlled  Pt will follow at home     Medication Adjustments/Labs and Tests Ordered: Current medicines are reviewed at length with the  patient today.  Concerns regarding medicines are outlined above.  Tests Ordered: No orders of the defined types were placed in this encounter.  Medication Changes: No orders of the defined types were placed in this encounter.   Signed, Dorris Carnes, MD  04/27/2019 4:14 PM    [facility] [address], [place], [place]  [address] Phone: [phone]; Fax: [phone]

## 2019-04-27 NOTE — Patient Instructions (Addendum)
Medication Instructions:  Your physician has recommended you make the following change in your medication:  1.) stop atenolol 2.) increase diltiazem to twice a day (30 mg tablets)   *If you need a refill on your cardiac medications before your next appointment, please call your pharmacy*  Lab Work: Today: tsh, bmet, vit D If you have labs (blood work) drawn today and your tests are completely normal, you will receive your results only by: Marland Kitchen MyChart Message (if you have MyChart) OR . A paper copy in the mail If you have any lab test that is abnormal or we need to change your treatment, we will call you to review the results.  Testing/Procedures: none  Follow-Up: At [facility], you and your health needs are our priority.  As part of our continuing mission to provide you with exceptional heart care, we have created designated Provider Care Teams.  These Care Teams include your primary Cardiologist (physician) and Advanced Practice Providers (APPs -  Physician Assistants and Nurse Practitioners) who all work together to provide you with the care you need, when you need it.  Your next appointment:   3 months  The format for your next appointment:   In Person  Provider:    Dorris Carnes, MD  Other Instructions

## 2019-04-28 LAB — BASIC METABOLIC PANEL
BUN/Creatinine Ratio: 10 (ref 10–24)
BUN: 15 mg/dL (ref 8–27)
CO2: 20 mmol/L (ref 20–29)
Calcium: 8.9 mg/dL (ref 8.6–10.2)
Chloride: 104 mmol/L (ref 96–106)
Creatinine, Ser: 1.46 mg/dL — ABNORMAL HIGH (ref 0.76–1.27)
GFR calc Af Amer: 51 mL/min/{1.73_m2} — ABNORMAL LOW (ref >59)
GFR calc non Af Amer: 44 mL/min/{1.73_m2} — ABNORMAL LOW (ref >59)
Glucose: 106 mg/dL — ABNORMAL HIGH (ref 65–99)
Potassium: 4.1 mmol/L (ref 3.5–5.2)
Sodium: 140 mmol/L (ref 134–144)

## 2019-04-28 LAB — TSH: TSH: 3.73 u[IU]/mL (ref 0.450–4.500)

## 2019-04-28 LAB — VITAMIN D 25 HYDROXY (VIT D DEFICIENCY, FRACTURES): Vit D, 25-Hydroxy: 34.1 ng/mL (ref 30.0–100.0)

## 2019-04-30 ENCOUNTER — Telehealth: Payer: Self-pay | Admitting: Internal Medicine

## 2019-04-30 MED ORDER — ATENOLOL 25 MG PO TABS: 12.5000 mg | ORAL_TABLET | Freq: Every day | ORAL | 3 refills | Status: DC

## 2019-04-30 NOTE — Telephone Encounter (Signed)
Sick Saturday and today in his stomach.  Headache at temples. Ate 2 scrambled eggs and toast and [place] bacon and [employer] and a whole bottle of water and a sprite. Sunday had a good day.  Saturday and today not so much. Adv per Dr. Harrington Challenger to take atenolol 12.5 mg mid day (now) and continue on diltiazem 30 mg am and pm.

## 2019-04-30 NOTE — Telephone Encounter (Signed)
Pt c/o BP issue: STAT if pt c/o blurred vision, one-sided weakness or slurred speech  1. What are your last 5 BP readings? Last night 175/77 pulse 78 this morning 168/84 pulse 71 he walk to trune his heat up   2. Are you having any other symptoms (ex. Dizziness, headache, blurred vision, passed out)? Headache Dizzy a little   3. What is your BP issue? Medication changed

## 2019-05-01 ENCOUNTER — Telehealth: Payer: Self-pay | Admitting: Internal Medicine

## 2019-05-01 NOTE — Telephone Encounter (Signed)
Cardiology Moonlighter Note  Returned page from patient. Was recently started on diltiazem in addition to atenolol. Taking these as directed. Blood pressure remains elevated. BP now 172/88. Has had some headaches lately. Wants to know if he should take his scheduled evening dose of diltiazem.  Recommended patient take his meds as scheduled. Also ok to take tylenol. I think there is an anxiety component in play as well. Recommended rest. Reviewed signs/symptoms of stroke/MI with patient. Recommended he come to ED immediately if he develops any of these. Patient will call back tonight if he has persistently elevated BP or if he has any adverse reaction to diltiazem. Will cc Dr. Harrington Challenger on this note.  Marcie Mowers, MD Cardiology Fellow, PGY-7

## 2019-05-02 ENCOUNTER — Telehealth: Payer: Self-pay | Admitting: Internal Medicine

## 2019-05-02 NOTE — Telephone Encounter (Signed)
°  Pt c/o medication issue:  1. Name of Medication: atenolol 12.5mg    2. How are you currently taking this medication (dosage and times per day)? 0.5 tablet daily  3. Are you having a reaction (difficulty breathing--STAT)? Dizzy and temples feel sore and dull  4. What is your medication issue? Patient called yesterday in regards to elevated HR. Was advised to take atenolol 12.5mg  mid day and continue on diltiazem 30mg  am and pm. He states the atenolol makes him dizzy and his temples feel sore and dull. States his HR was 53 and does not remember his BP reading. Want's to know if he should take another atenolol.

## 2019-05-02 NOTE — Telephone Encounter (Signed)
Patient has felt pretty good all day.  Checked BP and HR several times.  Got a high reading (170/) He checked this right after walking outside to the mailbox and back. 153/82, 62 while on the phone.   He is not going to take atenolol today because it made him dizzy and swimmy headed.  Feeling bad since then.   Discussed his diet. He at fish and french fries from [place]. He ate at [employer] 2 days ago. Eats chinese food about once a week. We discussed that at [place] can be very salty even when you don't add salt and to try to decrease sodium.

## 2019-05-04 ENCOUNTER — Telehealth: Payer: Self-pay | Admitting: Internal Medicine

## 2019-05-04 MED ORDER — DILTIAZEM HCL 30 MG PO TABS: 30.0000 mg | ORAL_TABLET | Freq: Three times a day (TID) | ORAL | 6 refills | Status: DC

## 2019-05-04 NOTE — Telephone Encounter (Signed)
Pt was seen one week ago in office.   I spoke with him 11/16 and 11/18 re: same symptoms. Will route to Dr. Harrington Challenger for input.

## 2019-05-04 NOTE — Telephone Encounter (Signed)
Spoke to pt  He got up this morning   Walked to front room to turn up heat and then back   SOB  Heart racing   BP 150/    No dizziness  Recomm:  Increase diltiazem to 30 tid     If he feels good on this would move to 120 CD daily    Or 180      Continue to follow HR and BP

## 2019-05-04 NOTE — Telephone Encounter (Signed)
New message    STAT if HR is under 50 or over 120 (normal HR is 60-100 beats per minute)  1) What is your heart rate? Yesterday: bp 132/70 hr 70; pt has not taken today  2) Do you have a log of your heart rate readings (document readings)? Yes  3) Do you have any other symptoms? Pt woke up this morning to cut on heat, had to sit down due to SOB and felt pulse racing. Wants to talk to [facility] about bp and heart rate  Please call.

## 2019-05-04 NOTE — Telephone Encounter (Signed)
Medication list updated.

## 2019-05-04 NOTE — Telephone Encounter (Signed)
I spoke to the patient who would like to speak to [facility] about his BP/HR and medications.  He got up this morning and his heart was "racing" and he felt SOB.  He sat down for about 15 minutes and symptoms subsided.   He just checked his HR- 76 and BP 142/85.  He called to see if Dr Harrington Challenger had any recommendations on a change with his medications, particularly Diltiazem.  Please call to advise, thank you.

## 2019-05-07 ENCOUNTER — Telehealth: Payer: Self-pay | Admitting: Internal Medicine

## 2019-05-07 NOTE — Telephone Encounter (Signed)
New message   Pt c/o medication issue:  1. Name of Medication: diltiazem (CARDIZEM   2. How are you currently taking this medication (dosage and times per day)? Last dose 11/22  3. Are you having a reaction (difficulty breathing--STAT)? no  4. What is your medication issue? 168/80 last BP on 11/22, patient would like to discuss medication

## 2019-05-07 NOTE — Telephone Encounter (Signed)
Dr. Harrington Challenger told him he can take an extra diltiazem or two (up to 4 a day) As soon as he walks BP goes up, on diltiazem it does not come down like it does when on atenolol. Would rather have heart skipping than blood pressure go high.    He is back to taking atenolol (1/2 tablet) twice a day and BP is good, he feels good.  Got out an walked today.  If starts to get palpitations he is going to take a diltiazem.  Called Saturday night because BP was elevated (3am) Sick at stomach, lightheaded.  Head pounding.  Ended up taking atenolol which helped.  Pt reported that a nurse from his health plan was going to call and try to coordinate care with Dr. Harrington Challenger.

## 2019-05-08 NOTE — Telephone Encounter (Signed)
Called pt.  He is feeling good today  BP 117/60   P 50s   No dizziness  No palpitations   No SOB Took atenolol this am   No dilt I would keep on same regimen  Does not need to take BP and P so frequently if feeling good

## 2019-05-14 ENCOUNTER — Telehealth: Payer: Self-pay | Admitting: Family Medicine

## 2019-05-14 NOTE — Telephone Encounter (Signed)
Pt has had similar concerns previously & has adjusted medications multiple times to attempt to find a happy medium for the patient.   Called the patient back regarding his concerns. He reports feeling "swimmy headed" several times a day as well as his legs feeling as if they are 100 pounds when he walks. He has  taken his Atenolol today. BP of 153/74 with a HR of 60. Pt states he has not taken any of his Ditalizem today. Advised pt to go ahead and take that medication as prescribed. Pt requested to only take half tablet at this time. Advised patient that would be ok.   Pt also complains of some inner ear pain. Advised pt to reach out to his PCP as these symptoms may not be related to his cardiology concerns. Pt states he does not wish to be seen by his PCP at this time.   Pt is to recheck his BP this evening and in the morning and then to call back with an update. Will route to [place] for [place].

## 2019-05-14 NOTE — Telephone Encounter (Signed)
Patient called today to give update, he states he is not doing as he should. He states his pulse is in the 50's and when he gets up from sitting he is lightheaded, he also can't walk long distance, he feels his legs very heavy like they each weigh 100lbs.

## 2019-05-14 NOTE — Chronic Care Management (AMB) (Signed)
Chronic Care Management   Note  05/14/2019 Name: ERYCK NEGRON Sr. MRN: [id number] DOB: 20-Sep-1934  [name] is a 84 y.o. year old male who is a primary care patient of Stacks, Cletus Gash, MD. I reached out to [place]. by phone today in response to a referral sent by Mr. AMEDEE CERRONE Sr.'s health plan.     Mr. Tietze was given information about Chronic Care Management services today including:  1. CCM service includes personalized support from designated clinical staff supervised by his physician, including individualized plan of care and coordination with other care providers 2. 24/7 contact phone numbers for assistance for urgent and routine care needs. 3. Service will only be billed when office clinical staff spend 20 minutes or more in a month to coordinate care. 4. Only one practitioner may furnish and bill the service in a calendar month. 5. The patient may stop CCM services at any time (effective at the end of the month) by phone call to the office staff. 6. The patient will be responsible for cost sharing (co-pay) of up to 20% of the service fee (after annual deductible is met).  Patient agreed to services and verbal consent obtained.   Follow up plan: Telephone appointment with CCM team member scheduled for:  [place], [place] [facility]  [place], [place] [address] Direct Dial: [email or web address]  Website: [email or web address]

## 2019-05-18 ENCOUNTER — Telehealth: Payer: Self-pay | Admitting: Internal Medicine

## 2019-05-18 NOTE — Telephone Encounter (Signed)
Patient has been taking half tablet of diltiazem (CARDIZEM) 30 MG tablet per conversation on 11/30. He is calling in for update. States that this did not help. BP was 168/84 HR 64. States he could not even walk to the bathroom, felt like he had anxiety and experienced headaches.

## 2019-06-04 ENCOUNTER — Telehealth: Payer: Self-pay | Admitting: Internal Medicine

## 2019-06-04 NOTE — Telephone Encounter (Signed)
New Message  Patient c/o Palpitations:  High priority if patient c/o lightheadedness, shortness of breath, or chest pain  1) How long have you had palpitations/irregular HR/ Afib? Are you having the symptoms now? Years; had heart surgery a couple years ago  2) Are you currently experiencing lightheadedness, SOB or CP? lightheadedness  3) Do you have a history of afib (atrial fibrillation) or irregular heart rhythm? Yes   4) Have you checked your BP or HR? (document readings if available): 130/66; 59 HR  5) Are you experiencing any other symptoms? No

## 2019-06-04 NOTE — Telephone Encounter (Signed)
Can't do anything without heart skipping. Took 20 mg sildenafil and for about 6 days had bad palpitations.  Wants to know Is there nothing they can give me to stop my heart from skipping?  Starts with minimal activity -  Taking 1/2 atenolol (12.5) twice every day.   BP and HR are 126/67, 4 Feels swimmy headed. Got some hydrocortisone ear drops for this, and they help.    Diltiazem makes his head hurt and makes his BP go up to 180 when he walks to bathroom or eats a piece of chocolate or drinks a 1/2 cup of decaf coffee.    His brother in law had this and he had an ablation.  He wonders if what his options are for this.   Pt wore event montior in July: Sinus rhythm with PACs    No sustained arrhythmias Average HR most days in low 60s  No pauses Symptoms correlate with above pacs   He would like to know Dr. Alan Ripper opinion.  He is getting tired of dealing with these and feels his quality of life is suffering from it.

## 2019-06-05 ENCOUNTER — Ambulatory Visit: Payer: PPO | Admitting: *Deleted

## 2019-06-05 DIAGNOSIS — I10 Essential (primary) hypertension: Secondary | ICD-10-CM

## 2019-06-05 DIAGNOSIS — I25118 Atherosclerotic heart disease of native coronary artery with other forms of angina pectoris: Secondary | ICD-10-CM

## 2019-06-05 NOTE — Chronic Care Management (AMB) (Signed)
  Chronic Care Management   Outreach Note  06/05/2019 Name: Ronald EWEN Sr. MRN: [id number] DOB: [birth year]  Referred by: Claretta Fraise, MD Reason for referral : Chronic Care Management (Initial Outreach)   An unsuccessful initial telephone outreach was attempted today. The patient was referred to the case management team by for assistance with care management and care coordination.   Follow Up Plan: The care management team will reach out to the patient again over the next 30 days.   Chong Sicilian, BSN, RN-BC Embedded Chronic Care Manager [facility] [place] [facility] Direct Dial: [phone]

## 2019-06-12 ENCOUNTER — Other Ambulatory Visit: Payer: Self-pay | Admitting: Family Medicine

## 2019-06-12 DIAGNOSIS — R002 Palpitations: Secondary | ICD-10-CM

## 2019-06-12 DIAGNOSIS — R42 Dizziness and giddiness: Secondary | ICD-10-CM

## 2019-06-18 ENCOUNTER — Other Ambulatory Visit: Payer: Self-pay | Admitting: Family Medicine

## 2019-06-19 ENCOUNTER — Other Ambulatory Visit: Payer: Self-pay | Admitting: Family Medicine

## 2019-06-19 ENCOUNTER — Telehealth: Payer: Self-pay | Admitting: Family Medicine

## 2019-06-19 DIAGNOSIS — R42 Dizziness and giddiness: Secondary | ICD-10-CM

## 2019-06-19 DIAGNOSIS — R002 Palpitations: Secondary | ICD-10-CM

## 2019-06-20 ENCOUNTER — Other Ambulatory Visit: Payer: Self-pay | Admitting: Family Medicine

## 2019-06-20 DIAGNOSIS — R002 Palpitations: Secondary | ICD-10-CM

## 2019-06-20 DIAGNOSIS — R42 Dizziness and giddiness: Secondary | ICD-10-CM

## 2019-06-25 ENCOUNTER — Other Ambulatory Visit: Payer: Self-pay | Admitting: Family Medicine

## 2019-06-25 DIAGNOSIS — R42 Dizziness and giddiness: Secondary | ICD-10-CM

## 2019-06-25 DIAGNOSIS — R002 Palpitations: Secondary | ICD-10-CM

## 2019-06-25 NOTE — Telephone Encounter (Signed)
Before any change in Rx would recomm an event monitor to demonstrate if any change from previous Last monitor showed no arrhythmia that warrants ablation

## 2019-06-28 NOTE — Telephone Encounter (Signed)
Tues got vaccine and was up 8:30 am, got home 8:00 pm  Only at cheese crackers, sprite and a bottle of water. Heart skipped a lot afterward.   Feeling good today.  Pt informed of recommendations below. He said as of right now it is not bad.  Goes a week or so and feels well and then for a few days he will feel a lot of skips.   Currently on ear drops from ear doctor that are helping his swimmy headedness.  Still only taking 1/2 atenolol twice a day. Has follow up scheduled with Dr. Harrington Challenger in February.

## 2019-07-02 ENCOUNTER — Other Ambulatory Visit: Payer: Self-pay | Admitting: Family Medicine

## 2019-07-02 NOTE — Telephone Encounter (Signed)
Spoke with pt and advised him that particular medicine is not something we do refills on. It was denied and then a call was sent to the provider but he was on vacation. Pt wants to know why he wasn't called back and I apologized to the pt and advised next time if he hasn't heard anything back after 1-2 days to call us back and pt voiced understanding.

## 2019-07-10 ENCOUNTER — Telehealth: Payer: Self-pay | Admitting: Internal Medicine

## 2019-07-10 MED ORDER — ATENOLOL 25 MG PO TABS: 12.5000 mg | ORAL_TABLET | Freq: Two times a day (BID) | ORAL | 3 refills | Status: DC

## 2019-07-10 NOTE — Telephone Encounter (Signed)
*  STAT* If patient is at the pharmacy, call can be transferred to refill team.   1. Which medications need to be refilled? (please list name of each medication and dose if known)   atenolol (TENORMIN) 25 MG tablet   2. Which pharmacy/location (including street and city if local pharmacy) is medication to be sent to? [id number] - [place], [place] - [address] 3. Do they need a 30 day or 90 day supply? 90 day    Patient states he takes two 1/2 tablets a day instead of 1 like suggested on the bottle and is running out of medication.

## 2019-07-10 NOTE — Telephone Encounter (Signed)
Called patient. Refilled atenolol. No further concerns.

## 2019-07-10 NOTE — Telephone Encounter (Signed)
Patient reports he got a rash about 1 week after covid19 vaccine. He was instructed to take antihistamine before coming to get next vaccine and wanted to know what he can take.  Adv from a heart perspective he can have any allergy medicine as long as it does not contain decongestant. Pt verbalizes understanding.

## 2019-07-10 NOTE — Telephone Encounter (Signed)
Pt c/o medication issue:  1. Name of Medication: Cetirizine(Zyrtec)  2. How are you currently taking this medication (dosage and times per day)? Not currently taking medication   3. Are you having a reaction (difficulty breathing--STAT)? Yes   4. What is your medication issue? Patient is calling stating he is having a reaction to the Covid-19 Vaccine and was advised by the office that gave the shot to reach out to Dr. Harrington Challenger and clarify if it is okay for the patient to take Zyrtec in regards to the reaction. Please advise.

## 2019-07-10 NOTE — Telephone Encounter (Signed)
Called pt and pt stated that he is out of his medication of Atenolol 25 mg tablet, because pt takes 1/2 tablet BID, but pt's med list states for pt to take Atenolol 25 mg tablet 1/2 tablet daily. Pt also stated that he is not taking the diltiazem 30 mg tablet, pt states that he told [name], RN, Dr. Harrington Challenger nurse that this medication makes his B/P increase. Pt would like a call back concerning this matter. Please address

## 2019-07-15 NOTE — Progress Notes (Signed)
Cardiology Office Note:    Date:  07/16/2019   ID:  Ronald Fear Sr., DOB [birth year], MRN [id number]  PCP:  Ronald Fraise, MD  Cardiologist:  Dorris Carnes, MD   Electrophysiologist:  None   Referring MD: Ronald Fraise, MD   F/U of CAD     History of Present Illness:    Ronald Russell. is a 85 y.o. male with coronary artery disease status post CABG in June 9629 complicated by postoperative atrial fibrillation  bladder cancer, temporal arteritis, hypertension, hyperlipidemia.   The patient has had a long history of palpitations.  Event monitor in the past has demonstrated symptomatic PACs.  He has continued to have worsening palpitations since his bypass surgery.  Multiple medication changes have been made.  67 Hr Holter in 02/2018 demonstrated normal sinus rhythm with avg HR 71, occasional PVCs and PACs.    Echo in July 2020 LVEF normal   Event monitor in Aug showed SR with PACs   He has been maintained on atenolol and I added diltiazem    He inncreased to bid but heart rate went back into 40s   He got nervous and pulled back     I saw th patient  In clinic November 2020 The patient has had intermittent palpitations.   He did not like being on diltiazem   Prefers b blocker      He continues to have dizziness   He says that he has had ear issues for a long time   He has drops for ears  ( from [facility], ENT)   When he uses the drops his symptoms of dizziness subsides   He has not   Prior CV studies:   The following studies were reviewed today:  60 Hr Holter 02/2018 Sinus rhythm   51 to 106 bpm   Average HR 71 bpm Occasional PVC  Occasional PAC Longest pause 1.5 sec Diary entry associated with SR and also SR with  PAC  Pre CABG Dopplers 12/04/17  Final Interpretation: Right Carotid: The extracranial vessels were near-normal with only minimal wall thickening or plaque. Left Carotid: The extracranial vessels were near-normal with only minimal wall thickening or plaque. Vertebrals:   Bilateral vertebral arteries demonstrate antegrade flow. Subclavians: Normal flow hemodynamics were seen in bilateral subclavian arteries. Right Upper Extremity: Doppler waveforms remain within normal limits with compression. Doppler waveforms remain within normal limits with compression. Left Upper Extremity: Doppler waveforms remain within normal limits with compression. Doppler waveforms remain within normal limits with compression.   Cardiac Catheterization 12/02/17 LAD prox 75, mid 100 (R-L collats); D2 75 LCx prox 80, mid 70 RCA mild disease EF 55-65   Nuclear stress test 11/24/17 Intermediate risk stress nuclear study with a new area of mild ischemia in the distal LAD artery territory. Normal left ventricular regional and global systolic function. EF 85   Event Monitor 08/2016 Sinus rhythm with PACs  Sensed as skips     Echo 09/01/16 EF 60-65, no RWMA, Gr 2 DD   Past Medical History:  Diagnosis Date  . Allergy   . Anxiety   . Bladder cancer ([place]) 2010   Tx with BCG  . CAD (coronary artery disease)    LHC 6/19: pLAD 75, mLAD 100, D2 75; pLCx 74, mLCx 70, EF 55-65 >> s/p CABG // Echo 3/18: EF 60-65, Gr 2 DD  . Chronic bronchitis ([place])    "yearly the last 3 yrs" (02/13/2013)  . GERD (gastroesophageal reflux disease)   .  History of blood transfusion 1949   "seeral" (02/13/2013)  . HOH (hard of hearing)   . Hypertension   . Ocular migraine    "not often" (02/13/2013)  . Pneumonia    "last time ~ 2 yr ago; had it before that too" (02/13/2013)  . Seasonal allergies   . Shingles    has neuropathy since it happened in 6/17  . Temporal arteritis ([place])   . Thrombocytopenia ([place]) 11/29/2016   Surgical Hx: The patient  has a past surgical history that includes Transurethral resection of bladder tumor (2010 X 3); mastoid tumor removed (Right, 1964); Tonsillectomy (1940's); Skin graft (Right, 1949); Colonoscopy; Vasectomy; Artery Biopsy (Left, 01/09/2013); Cardiovascular stress test  (07/2011); Cholecystectomy (N/A, 11/30/2016); LEFT HEART CATH AND CORONARY ANGIOGRAPHY (N/A, 12/02/2017); Coronary artery bypass graft (N/A, 12/07/2017); and TEE without cardioversion (N/A, 12/07/2017).   Current Medications: Current Meds  Medication Sig  . acetaminophen (TYLENOL) 650 MG CR tablet Take 650 mg by mouth every 6 (six) hours as needed for pain.  Marland Kitchen aspirin EC 81 MG tablet Take 1 tablet (81 mg total) by mouth daily.  Marland Kitchen atenolol (TENORMIN) 25 MG tablet Take 0.5 tablets (12.5 mg total) by mouth 2 (two) times daily.  Marland Kitchen CALCIUM-MAGNESIUM-VITAMIN D PO Take 2 tablets by mouth at bedtime.   . cetirizine (ZYRTEC) 10 MG tablet TAKE 1 TABLET BY MOUTH EVERY DAY  . fluocinonide cream (LIDEX) 0.05 % Apply topically 2 (two) times daily.  Marland Kitchen neomycin-polymyxin-hydrocortisone (CORTISPORIN) 3.5-10000-1 OTIC suspension PLACE 4 5 DROPS INTO AFFECTED EAR TWICE DAILY FOR 5 DAYS AS NEEDED FOR DRAINAGE  . pantoprazole (PROTONIX) 40 MG tablet TAKE 1 TABLET BY MOUTH TWICE A DAY  . Potassium Gluconate 550 MG TABS Take 1 tablet (550 mg total) by mouth at bedtime. Need to take 1 tab one day and 2 the next day alternating  . sildenafil (REVATIO) 20 MG tablet TAKE 1 TABLET (20 MG TOTAL) BY MOUTH DAILY AS NEEDED (2-5 AS NEEDED).     Allergies:   Uloric [febuxostat], Doxazosin, Augmentin [amoxicillin-pot clavulanate], Ciprofloxacin, Codeine, Levaquin [levofloxacin], Oxycodone, Tramadol, and Vicodin [hydrocodone-acetaminophen]   Social History   Tobacco Use  . Smoking status: Former Smoker    Packs/day: 0.75    Years: 3.00    Pack years: 2.25    Types: Cigarettes  . Smokeless tobacco: Never Used  . Tobacco comment: 02/13/2013 "quit smoking age 10"  Substance Use Topics  . Alcohol use: No    Comment: 02/13/2013 "probably a pint of whiskey/wk up til I was probably 85 yr old"  . Drug use: No     Family Hx: The patient's family history includes Alzheimer's disease in his father; Cancer in his mother, sister, and  sister. There is no history of Anemia, Arrhythmia, Asthma, Clotting disorder, Fainting, Heart attack, Heart disease, Heart failure, Hyperlipidemia, Hypertension, or Migraines.  ROS:   Please see the history of present illness.    All other systems reviewed and are negative.   EKGs/Labs/Other Test Reviewed:    EKG:  EKG is not ordered today.    Recent Labs: 09/28/2018: BNP 147.3 01/09/2019: Hemoglobin 13.0; Platelets 195 04/13/2019: ALT 11 04/27/2019: BUN 15; Creatinine, Ser 1.46; Potassium 4.1; Sodium 140; TSH 3.730   Recent Lipid Panel Lab Results  Component Value Date/Time   CHOL 160 04/13/2019 02:58 PM   TRIG 165 (H) 04/13/2019 02:58 PM   HDL 34 (L) 04/13/2019 02:58 PM   CHOLHDL 4.7 04/13/2019 02:58 PM   CHOLHDL 3.3 12/02/2017 12:53 AM  [place] 97 04/13/2019 02:58 PM    Physical Exam:    VS:  BP (!) 112/52   Pulse 60   Ht 6' (1.829 m)   Wt 204 lb 3.2 oz (92.6 kg)   SpO2 97%   BMI 27.69 kg/m     Orthostatics: Laying :  125/72  P 57   Sitting   129/78  P 59   Standing:   123/68  P 61   Standing 4 min 122/75  P 61   Wt Readings from Last 3 Encounters:  07/16/19 204 lb 3.2 oz (92.6 kg)  04/27/19 204 lb 6.4 oz (92.7 kg)  01/09/19 201 lb (91.2 kg)   Gen:   Pt is in NAD  Neck:   JVP is normal   No bruits Lungs:   Clear to ausculation  No rales or wheezes   Cardiac RRR S1, S2  No S3    No signific murmurs  Abd   Supple   No hepatomegaly   No masses  Ext   Trivial  edema     2+ pulses      ASSESSMENT & PLAN:    Dizziness   Pt has had a long hx   He is not orthostatic on exam.   And he reports improvement with ear drops    ? Ear involvement    He has been followed by Dr Lucia Gaskins in the past  I encouraged him to contact for continued work up  2  Palpitations    Continues to have a few spells a week   Seems to be fairly controlled on b blocker   Would continue  3  Coronary artery disease involving native coronary artery of native heart without angina pectoris S/p CABG  in 11/2017.   Continue ASA, statin, beta-blocker.    4 Mixed hyperlipidemia   LDL in jUly 2020 was 74  HDL 34    Keep on current regimen    5  HTN  Keep on current regimen   Medication Adjustments/Labs and Tests Ordered: Current medicines are reviewed at length with the patient today.  Concerns regarding medicines are outlined above.  Tests Ordered: No orders of the defined types were placed in this encounter.  Medication Changes: No orders of the defined types were placed in this encounter.   Signed, Dorris Carnes, MD  07/16/2019 [facility] [place], [place], [place]  [address] Phone: [phone]; Fax: [phone]

## 2019-07-16 ENCOUNTER — Encounter: Payer: Self-pay | Admitting: Internal Medicine

## 2019-07-16 ENCOUNTER — Ambulatory Visit (INDEPENDENT_AMBULATORY_CARE_PROVIDER_SITE_OTHER): Payer: PPO | Admitting: Internal Medicine

## 2019-07-16 ENCOUNTER — Telehealth: Payer: Self-pay | Admitting: Internal Medicine

## 2019-07-16 ENCOUNTER — Other Ambulatory Visit: Payer: Self-pay

## 2019-07-16 VITALS — BP 112/52 | HR 60 | Ht 72.0 in | Wt 204.2 lb

## 2019-07-16 DIAGNOSIS — I10 Essential (primary) hypertension: Secondary | ICD-10-CM

## 2019-07-16 DIAGNOSIS — I2510 Atherosclerotic heart disease of native coronary artery without angina pectoris: Secondary | ICD-10-CM | POA: Diagnosis not present

## 2019-07-16 DIAGNOSIS — E782 Mixed hyperlipidemia: Secondary | ICD-10-CM | POA: Diagnosis not present

## 2019-07-16 DIAGNOSIS — R002 Palpitations: Secondary | ICD-10-CM | POA: Diagnosis not present

## 2019-07-16 NOTE — Telephone Encounter (Signed)
Noted.  Unable to reach patient on both numbers. Appointment noted.

## 2019-07-16 NOTE — Patient Instructions (Signed)
Medication Instructions:  No changes today *If you need a refill on your cardiac medications before your next appointment, please call your pharmacy*  Lab Work: See prescription given to you today.  If you have labs (blood work) drawn today and your tests are completely normal, you will receive your results only by: Marland Kitchen MyChart Message (if you have MyChart) OR . A paper copy in the mail If you have any lab test that is abnormal or we need to change your treatment, we will call you to review the results.  Testing/Procedures: None ordered.  Follow-Up: At [facility], you and your health needs are our priority.  As part of our continuing mission to provide you with exceptional heart care, we have created designated Provider Care Teams.  These Care Teams include your primary Cardiologist (physician) and Advanced Practice Providers (APPs -  Physician Assistants and Nurse Practitioners) who all work together to provide you with the care you need, when you need it.  Your next appointment:   6 month(s)  The format for your next appointment:   In Person  Provider:   You may see Dorris Carnes, MD or one of the following Advanced Practice Providers on your designated Care Team:    Richardson Dopp, PA-C  [place], [place]  Daune Perch, NP   Other Instructions

## 2019-07-16 NOTE — Telephone Encounter (Signed)
New message  Patient is calling in to get approval for his wife to accompany him to his appointment with Dr. Harrington Challenger today at [facility]. Patient states that he is dizziness and will need his wife to help him with getting out of the car and walking. Please call patient and confirm.

## 2019-07-18 ENCOUNTER — Other Ambulatory Visit: Payer: Self-pay

## 2019-07-18 ENCOUNTER — Other Ambulatory Visit: Payer: PPO

## 2019-07-18 ENCOUNTER — Telehealth: Payer: Self-pay | Admitting: Internal Medicine

## 2019-07-18 DIAGNOSIS — R002 Palpitations: Secondary | ICD-10-CM | POA: Diagnosis not present

## 2019-07-18 DIAGNOSIS — E559 Vitamin D deficiency, unspecified: Secondary | ICD-10-CM | POA: Diagnosis not present

## 2019-07-18 NOTE — Telephone Encounter (Signed)
Dr Pollie Friar office is currently closed.  Will try later today

## 2019-07-18 NOTE — Telephone Encounter (Signed)
I spoke with Dr Pollie Friar office. Pt was last seen in 2020 and does not need a referral. He can contact the office to make appointment. I spoke with patient and gave him this information. He wants to make sure information from Dr Harrington Challenger has been communicated to Dr Lucia Gaskins.  I told patient I would send office note from last visit with Dr Harrington Challenger  to Dr Lucia Gaskins.  Office note routed through [employer].

## 2019-07-18 NOTE — Telephone Encounter (Signed)
Patient states he was supposed to have an appointment with Dr. Lucia Gaskins at [facility], but their office has not heard anything.

## 2019-07-19 ENCOUNTER — Telehealth: Payer: Self-pay | Admitting: Internal Medicine

## 2019-07-19 ENCOUNTER — Telehealth: Payer: Self-pay

## 2019-07-19 DIAGNOSIS — E559 Vitamin D deficiency, unspecified: Secondary | ICD-10-CM

## 2019-07-19 LAB — BASIC METABOLIC PANEL
BUN/Creatinine Ratio: 13 (ref 10–24)
BUN: 18 mg/dL (ref 8–27)
CO2: 22 mmol/L (ref 20–29)
Calcium: 9.2 mg/dL (ref 8.6–10.2)
Chloride: 104 mmol/L (ref 96–106)
Creatinine, Ser: 1.4 mg/dL — ABNORMAL HIGH (ref 0.76–1.27)
GFR calc Af Amer: 53 mL/min/{1.73_m2} — ABNORMAL LOW (ref >59)
GFR calc non Af Amer: 46 mL/min/{1.73_m2} — ABNORMAL LOW (ref >59)
Glucose: 83 mg/dL (ref 65–99)
Potassium: 4.2 mmol/L (ref 3.5–5.2)
Sodium: 141 mmol/L (ref 134–144)

## 2019-07-19 LAB — VITAMIN D 25 HYDROXY (VIT D DEFICIENCY, FRACTURES): Vit D, 25-Hydroxy: 33.3 ng/mL (ref 30.0–100.0)

## 2019-07-19 LAB — TSH: TSH: 4.47 u[IU]/mL (ref 0.450–4.500)

## 2019-07-19 MED ORDER — VITAMIN D (CHOLECALCIFEROL) 10 MCG (400 UNIT) PO CAPS: 400.0000 [IU] | ORAL_CAPSULE | Freq: Every day | ORAL | 12 refills | Status: DC

## 2019-07-19 NOTE — Telephone Encounter (Signed)
Called pt He is due to get second COVID vaccine Saturday After first he had local reaction on arm with swelling    No other complaints  I have reviewed with pharmacy  OK to take second dose   Rx with antihistamine prior   He has problem with Pura Spice so will take Zyrtec. 1.5 hours prior  Wait a little while longer to see how feels   (20 min)  Nevin Bloodgood RossMD

## 2019-07-19 NOTE — Telephone Encounter (Signed)
Pt verbalized understanding of his lab results and has taken Vit D in the past and will restart it and be sure it is 400 U as recommended by Dr. Harrington Challenger.  Pt to have repeat Vit D level  11/2019.

## 2019-07-19 NOTE — Telephone Encounter (Signed)
-----   Message from Dorris Carnes V, MD sent at 07/19/2019  9:19 AM EST ----- Kidney function is stable  Electrolytesa are OK Thyroid function is normal Vit D is a little loow   I would recomm taking 400 U /day   Check Vit D level in 4 to 5 months

## 2019-07-20 ENCOUNTER — Other Ambulatory Visit: Payer: Self-pay

## 2019-07-20 ENCOUNTER — Encounter: Payer: Self-pay | Admitting: Family Medicine

## 2019-07-20 ENCOUNTER — Ambulatory Visit (INDEPENDENT_AMBULATORY_CARE_PROVIDER_SITE_OTHER): Payer: PPO | Admitting: Family Medicine

## 2019-07-20 ENCOUNTER — Telehealth: Payer: Self-pay | Admitting: Internal Medicine

## 2019-07-20 DIAGNOSIS — J0111 Acute recurrent frontal sinusitis: Secondary | ICD-10-CM | POA: Diagnosis not present

## 2019-07-20 DIAGNOSIS — M316 Other giant cell arteritis: Secondary | ICD-10-CM | POA: Diagnosis not present

## 2019-07-20 DIAGNOSIS — Z8551 Personal history of malignant neoplasm of bladder: Secondary | ICD-10-CM | POA: Insufficient documentation

## 2019-07-20 DIAGNOSIS — R42 Dizziness and giddiness: Secondary | ICD-10-CM | POA: Diagnosis not present

## 2019-07-20 MED ORDER — PREDNISONE 20 MG PO TABS: 20.0000 mg | ORAL_TABLET | Freq: Every day | ORAL | 0 refills | Status: AC

## 2019-07-20 MED ORDER — DOXYCYCLINE HYCLATE 100 MG PO TABS: 100.0000 mg | ORAL_TABLET | Freq: Two times a day (BID) | ORAL | 0 refills | Status: AC

## 2019-07-20 MED ORDER — FLUTICASONE PROPIONATE 50 MCG/ACT NA SUSP: 2.0000 | Freq: Every day | NASAL | 6 refills | Status: DC

## 2019-07-20 NOTE — Telephone Encounter (Addendum)
He feels it might make his symptoms worse (skipping, pounding heartbeat).  Wants to make sure Dr. Harrington Challenger feels it is safe.  I adv that he may feel a little more skipping on steroid but it would be temporary and as far as his other medications there should be no interactions with cardiac medications.

## 2019-07-20 NOTE — Telephone Encounter (Signed)
Pt c/o medication issue:  1. Name of Medication:  predniSONE (DELTASONE) 20 MG tablet  doxycycline (VIBRA-TABS) 100 MG tablet   2. How are you currently taking this medication (dosage and times per day)? Not currently taking medication  3. Are you having a reaction (difficulty breathing--STAT)? No   4. What is your medication issue? Patient is calling stating he has been prescribed the listed medications. He wanted to clarify with Dr. Harrington Challenger that it is okay for him to take both with his current medications before he begins taking them. Please advise.
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
## 2019-07-20 NOTE — Telephone Encounter (Signed)
I agree   Skipping will be temporary  Nothing irreversible  Should be mild if has

## 2019-07-23 ENCOUNTER — Other Ambulatory Visit: Payer: Self-pay

## 2019-07-23 ENCOUNTER — Ambulatory Visit (INDEPENDENT_AMBULATORY_CARE_PROVIDER_SITE_OTHER): Payer: PPO | Admitting: Otolaryngology

## 2019-07-23 VITALS — Temp 97.5°F

## 2019-07-23 DIAGNOSIS — J310 Chronic rhinitis: Secondary | ICD-10-CM | POA: Diagnosis not present

## 2019-07-23 DIAGNOSIS — R42 Dizziness and giddiness: Secondary | ICD-10-CM

## 2019-07-23 DIAGNOSIS — H7291 Unspecified perforation of tympanic membrane, right ear: Secondary | ICD-10-CM

## 2019-07-23 DIAGNOSIS — H6123 Impacted cerumen, bilateral: Secondary | ICD-10-CM
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
## 2019-07-24 ENCOUNTER — Other Ambulatory Visit: Payer: Self-pay | Admitting: *Deleted

## 2019-07-25 ENCOUNTER — Telehealth: Payer: Self-pay | Admitting: Internal Medicine

## 2019-07-25 NOTE — Telephone Encounter (Signed)
Patient is calling in regards to his previous Ear, Nose, & Throat appointment that he was referred to by Dr. Harrington Challenger. He states he was advised by Rodman Key to callback in regards to what they found so they could further discuss it. Please advise.

## 2019-07-26 NOTE — Telephone Encounter (Signed)
cerumen impaction, removed at ENT office; was very painful to remove.  Thinks it has been there for over 2 years.  Read online that it could cause swimmy headed feeling.  Removed Monday 07/23/19. Continues on doxycycline that was prescribed last week.  Has not taken prednisone, wants to wait until after he gets vaccine tomorrow.  Will start Sat. Am.

## 2019-07-27 NOTE — Telephone Encounter (Signed)
Sounds good

## 2019-07-31 ENCOUNTER — Other Ambulatory Visit: Payer: Self-pay | Admitting: Family Medicine

## 2019-07-31 NOTE — Telephone Encounter (Signed)
Please review for refill.  

## 2019-08-03 ENCOUNTER — Telehealth: Payer: Self-pay | Admitting: Family Medicine

## 2019-08-03 NOTE — Telephone Encounter (Signed)
Patient aware to call ENT who put him on it and ask them how long he is to stay on medication

## 2019-08-07 ENCOUNTER — Encounter (INDEPENDENT_AMBULATORY_CARE_PROVIDER_SITE_OTHER): Payer: Self-pay | Admitting: Otolaryngology

## 2019-08-07 ENCOUNTER — Ambulatory Visit (INDEPENDENT_AMBULATORY_CARE_PROVIDER_SITE_OTHER): Payer: PPO | Admitting: Otolaryngology

## 2019-08-07 ENCOUNTER — Other Ambulatory Visit: Payer: Self-pay

## 2019-08-07 VITALS — Temp 98.2°F

## 2019-08-07 DIAGNOSIS — H6504 Acute serous otitis media, recurrent, right ear: Secondary | ICD-10-CM | POA: Diagnosis not present

## 2019-08-07 DIAGNOSIS — H60311 Diffuse otitis externa, right ear: Secondary | ICD-10-CM
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
## 2019-08-27 ENCOUNTER — Ambulatory Visit (INDEPENDENT_AMBULATORY_CARE_PROVIDER_SITE_OTHER): Payer: PPO | Admitting: Otolaryngology

## 2019-08-27 ENCOUNTER — Other Ambulatory Visit: Payer: Self-pay

## 2019-08-27 VITALS — Temp 97.7°F

## 2019-08-27 DIAGNOSIS — H95811 Postprocedural stenosis of right external ear canal: Secondary | ICD-10-CM | POA: Diagnosis not present

## 2019-08-27 DIAGNOSIS — H60311 Diffuse otitis externa, right ear: Secondary | ICD-10-CM
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
## 2019-09-03 ENCOUNTER — Telehealth: Payer: Self-pay | Admitting: Neurology

## 2019-09-03 NOTE — Telephone Encounter (Signed)
He can see amy if she is ok with that. I saw him for dizziness. He was orthostatic when I saw him. MRI brain and MRA of the head was unchanged, nothing acute. He was also having a headache when I saw him as well. I'm not sure we have anything else for him either but if Amy will agree we can set him up with appointment to make sure nothing else has changed. Amy, take a look and let us know if you are ok seeing him. thanks

## 2019-09-03 NOTE — Telephone Encounter (Signed)
Pt states he is having dizziness and a pain behind his eyes and he has gone to see his Cardiologist and they referred him to an ENT and they could not find anything either and he is needing to speak with RN to see what the next step will be. Please advise.

## 2019-09-04 NOTE — Telephone Encounter (Addendum)
I called pt on [phone] and LVM (ok per DPR) stating Dr. Jaynee Eagles is going to have pt see Amy NP. I asked pt to call back and schedule. As of now I let pt know we have Thurs 09/20/19 @ 1:30 pm however we will not schedule until we hear from the pt.

## 2019-09-04 NOTE — Telephone Encounter (Signed)
I do not think this is a neurologic etiology but I would see him again once and discuss.

## 2019-09-04 NOTE — Telephone Encounter (Signed)
I do not mind seeing him. I am not sure where we are leaning from a diagnosis standpoint? Doesn't sound neurological. I am more than happy to see him and see if there are any other thoughts. TY.

## 2019-09-05 NOTE — Telephone Encounter (Signed)
Spoke with Ronald Russell. Scheduled him with Amy NP for Thurs 4/8 @ 1:30 arrival 15-30 minutes early. Ronald Russell aware Dr. Jaynee Eagles does not feel this is neurologic but we will see him and discuss. Ronald Russell verbalized appreciation for the call.

## 2019-09-11 ENCOUNTER — Other Ambulatory Visit: Payer: Self-pay | Admitting: Family Medicine

## 2019-09-20 ENCOUNTER — Ambulatory Visit: Payer: PPO | Admitting: Family Medicine

## 2019-09-20 ENCOUNTER — Other Ambulatory Visit: Payer: Self-pay

## 2019-09-20 ENCOUNTER — Encounter: Payer: Self-pay | Admitting: Family Medicine

## 2019-09-20 VITALS — BP 123/68 | HR 55 | Temp 97.4°F | Ht 72.0 in | Wt 200.0 lb

## 2019-09-20 DIAGNOSIS — R2689 Other abnormalities of gait and mobility: Secondary | ICD-10-CM

## 2019-09-20 DIAGNOSIS — H9311 Tinnitus, right ear: Secondary | ICD-10-CM

## 2019-09-20 DIAGNOSIS — R42 Dizziness and giddiness: Secondary | ICD-10-CM

## 2019-09-20 NOTE — Patient Instructions (Addendum)
We will send you to vestibular therapy   Follow up closely with PCP, ENT and cardiology as directed.   Follow up with me in 6 months   Dizziness Dizziness is a common problem. It makes you feel unsteady or light-headed. You may feel like you are about to pass out (faint). Dizziness can lead to getting hurt if you stumble or fall. Dizziness can be caused by many things, including:  Medicines.  Not having enough water in your body (dehydration).  Illness. Follow these instructions at home: Eating and drinking   Drink enough fluid to keep your pee (urine) clear or pale yellow. This helps to keep you from getting dehydrated. Try to drink more clear fluids, such as water.  Do not drink alcohol.  Limit how much caffeine you drink or eat, if your doctor tells you to do that.  Limit how much salt (sodium) you drink or eat, if your doctor tells you to do that. Activity   Avoid making quick movements. ? When you stand up from sitting in a chair, steady yourself until you feel okay. ? In the morning, first sit up on the side of the bed. When you feel okay, stand slowly while you hold onto something. Do this until you know that your balance is fine.  If you need to stand in one place for a long time, move your legs often. Tighten and relax the muscles in your legs while you are standing.  Do not drive or use heavy machinery if you feel dizzy.  Avoid bending down if you feel dizzy. Place items in your home so you can reach them easily without leaning over. Lifestyle  Do not use any products that contain nicotine or tobacco, such as cigarettes and e-cigarettes. If you need help quitting, ask your doctor.  Try to lower your stress level. You can do this by using methods such as yoga or meditation. Talk with your doctor if you need help. General instructions  Watch your dizziness for any changes.  Take over-the-counter and prescription medicines only as told by your doctor. Talk with  your doctor if you think that you are dizzy because of a medicine that you are taking.  Tell a friend or a family member that you are feeling dizzy. If he or she notices any changes in your behavior, have this person call your doctor.  Keep all follow-up visits as told by your doctor. This is important. Contact a doctor if:  Your dizziness does not go away.  Your dizziness or light-headedness gets worse.  You feel sick to your stomach (nauseous).  You have trouble hearing.  You have new symptoms.  You are unsteady on your feet.  You feel like the room is spinning. Get help right away if:  You throw up (vomit) or have watery poop (diarrhea), and you cannot eat or drink anything.  You have trouble: ? Talking. ? Walking. ? Swallowing. ? Using your arms, hands, or legs.  You feel generally weak.  You are not thinking clearly, or you have trouble forming sentences. A friend or family member may notice this.  You have: ? Chest pain. ? Pain in your belly (abdomen). ? Shortness of breath. ? Sweating.  Your vision changes.  You are bleeding.  You have a very bad headache.  You have neck pain or a stiff neck.  You have a fever. These symptoms may be an emergency. Do not wait to see if the symptoms will go away. Get  medical help right away. Call your local emergency services (911 in the [place] Do not drive yourself to the hospital. Summary  Dizziness makes you feel unsteady or light-headed. You may feel like you are about to pass out (faint).  Drink enough fluid to keep your pee (urine) clear or pale yellow. Do not drink alcohol.  Avoid making quick movements if you feel dizzy.  Watch your dizziness for any changes. This information is not intended to replace advice given to you by your health care provider. Make sure you discuss any questions you have with your health care provider. Document Revised: 06/03/2017 Document Reviewed: 06/17/2016 Elsevier Patient  Education  [place].    Orthostatic Hypotension Blood pressure is a measurement of how strongly, or weakly, your blood is pressing against the walls of your arteries. Orthostatic hypotension is a sudden drop in blood pressure that happens when you quickly change positions, such as when you get up from sitting or lying down. Arteries are blood vessels that carry blood from your heart throughout your body. When blood pressure is too low, you may not get enough blood to your brain or to the rest of your organs. This can cause weakness, light-headedness, rapid heartbeat, and fainting. This can last for just a few seconds or for up to a few minutes. Orthostatic hypotension is usually not a serious problem. However, if it happens frequently or gets worse, it may be a sign of something more serious. What are the causes? This condition may be caused by:  Sudden changes in posture, such as standing up quickly after you have been sitting or lying down.  Blood loss.  Loss of body fluids (dehydration).  Heart problems.  Hormone (endocrine) problems.  Pregnancy.  Severe infection.  Lack of certain nutrients.  Severe allergic reactions (anaphylaxis).  Certain medicines, such as blood pressure medicine or medicines that make the body lose excess fluids (diuretics). Sometimes, this condition can be caused by not taking medicine as directed, such as taking too much of a certain medicine. What increases the risk? The following factors may make you more likely to develop this condition:  Age. Risk increases as you get older.  Conditions that affect the heart or the central nervous system.  Taking certain medicines, such as blood pressure medicine or diuretics.  Being pregnant. What are the signs or symptoms? Symptoms of this condition may include:  Weakness.  Light-headedness.  Dizziness.  Blurred vision.  Fatigue.  Rapid heartbeat.  Fainting, in severe cases. How is  this diagnosed? This condition is diagnosed based on:  Your medical history.  Your symptoms.  Your blood pressure measurement. Your health care provider will check your blood pressure when you are: ? Lying down. ? Sitting. ? Standing. A blood pressure reading is recorded as two numbers, such as "120 over 80" (or 120/80). The first ("top") number is called the systolic pressure. It is a measure of the pressure in your arteries as your heart beats. The second ("bottom") number is called the diastolic pressure. It is a measure of the pressure in your arteries when your heart relaxes between beats. Blood pressure is measured in a unit called mm Hg. Healthy blood pressure for most adults is 120/80. If your blood pressure is below 90/60, you may be diagnosed with hypotension. Other information or tests that may be used to diagnose orthostatic hypotension include:  Your other vital signs, such as your heart rate and temperature.  Blood tests.  Tilt table test. For  this test, you will be safely secured to a table that moves you from a lying position to an upright position. Your heart rhythm and blood pressure will be monitored during the test. How is this treated? This condition may be treated by:  Changing your diet. This may involve eating more salt (sodium) or drinking more water.  Taking medicines to raise your blood pressure.  Changing the dosage of certain medicines you are taking that might be lowering your blood pressure.  Wearing compression stockings. These stockings help to prevent blood clots and reduce swelling in your legs. In some cases, you may need to go to the hospital for:  Fluid replacement. This means you will receive fluids through an IV.  Blood replacement. This means you will receive donated blood through an IV (transfusion).  Treating an infection or heart problems, if this applies.  Monitoring. You may need to be monitored while medicines that you are taking  wear off. Follow these instructions at home: Eating and drinking   Drink enough fluid to keep your urine pale yellow.  Eat a healthy diet, and follow instructions from your health care provider about eating or drinking restrictions. A healthy diet includes: ? Fresh fruits and vegetables. ? Whole grains. ? Lean meats. ? Low-fat dairy products.  Eat extra salt only as directed. Do not add extra salt to your diet unless your health care provider told you to do that.  Eat frequent, small meals.  Avoid standing up suddenly after eating. Medicines  Take over-the-counter and prescription medicines only as told by your health care provider. ? Follow instructions from your health care provider about changing the dosage of your current medicines, if this applies. ? Do not stop or adjust any of your medicines on your own. General instructions   Wear compression stockings as told by your health care provider.  Get up slowly from lying down or sitting positions. This gives your blood pressure a chance to adjust.  Avoid hot showers and excessive heat as directed by your health care provider.  Return to your normal activities as told by your health care provider. Ask your health care provider what activities are safe for you.  Do not use any products that contain nicotine or tobacco, such as cigarettes, e-cigarettes, and chewing tobacco. If you need help quitting, ask your health care provider.  Keep all follow-up visits as told by your health care provider. This is important. Contact a health care provider if you:  Vomit.  Have diarrhea.  Have a fever for more than 2-3 days.  Feel more thirsty than usual.  Feel weak and tired. Get help right away if you:  Have chest pain.  Have a fast or irregular heartbeat.  Develop numbness in any part of your body.  Cannot move your arms or your legs.  Have trouble speaking.  Become sweaty or feel light-headed.  Faint.  Feel short  of breath.  Have trouble staying awake.  Feel confused. Summary  Orthostatic hypotension is a sudden drop in blood pressure that happens when you quickly change positions.  Orthostatic hypotension is usually not a serious problem.  It is diagnosed by having your blood pressure taken lying down, sitting, and then standing.  It may be treated by changing your diet or adjusting your medicines. This information is not intended to replace advice given to you by your health care provider. Make sure you discuss any questions you have with your health care provider. Document Revised: 11/24/2017 Document  Reviewed: 11/24/2017 Elsevier Patient Education  [employer].
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
## 2019-10-01 ENCOUNTER — Encounter: Payer: Self-pay | Admitting: Family Medicine

## 2019-10-01 ENCOUNTER — Ambulatory Visit (INDEPENDENT_AMBULATORY_CARE_PROVIDER_SITE_OTHER): Payer: PPO | Admitting: Family Medicine

## 2019-10-01 DIAGNOSIS — R42 Dizziness and giddiness: Secondary | ICD-10-CM

## 2019-10-01 DIAGNOSIS — L309 Dermatitis, unspecified: Secondary | ICD-10-CM | POA: Diagnosis not present

## 2019-10-01 MED ORDER — PREDNISONE 5 MG PO TABS: 5.0000 mg | ORAL_TABLET | Freq: Every day | ORAL | 5 refills | Status: DC

## 2019-10-01 NOTE — Progress Notes (Signed)
Subjective:    Patient ID: Ronald Russell., male    DOB: 08/16/1934, 85 y.o.   MRN: [id number]   HPI: Ronald Russell. is a 85 y.o. male presenting for red splotches and pimples on scalp. Present off and on for a long time. Wants to use triamcinolone. Wa taking prednisone in the past that helped. He wants to try it again. He said it also helped the dizziness.that has been dealing with that causes him to be off balance regularly for a few years.  Patient had some relief from using the triamcinolone over the last week and a half.  However his wife read the safety label that said that it should not be used for long prolonged period of time and wants further guidance.   Depression screen [facility] 2/9 11/02/2018 09/28/2018 05/29/2018 03/15/2018 02/17/2018  Decreased Interest 0 0 0 0 0  Down, Depressed, Hopeless 0 0 0 0 0  PHQ - 2 Score 0 0 0 0 0  Altered sleeping - 0 - - -  Tired, decreased energy - 0 - - -  Change in appetite - 0 - - -  Feeling bad or failure about yourself  - 0 - - -  Trouble concentrating - 0 - - -  Moving slowly or fidgety/restless - 0 - - -  Suicidal thoughts - 0 - - -  PHQ-9 Score - 0 - - -  Difficult doing work/chores - Not difficult at all - - -  Some recent data might be hidden     Relevant past medical, surgical, family and social history reviewed and updated as indicated.  Interim medical history since our last visit reviewed. Allergies and medications reviewed and updated.  ROS:  Review of Systems  Constitutional: Negative for fever.  Respiratory: Negative for shortness of breath.   Cardiovascular: Negative for chest pain.  Musculoskeletal: Negative for arthralgias.  Skin: Positive for rash.  Neurological: Positive for dizziness and light-headedness.     Social History   Tobacco Use  Smoking Status Former Smoker  . Packs/day: 0.75  . Years: 3.00  . Pack years: 2.25  . Types: Cigarettes  Smokeless Tobacco Never Used  Tobacco Comment   02/13/2013  "quit smoking age 60"       Objective:     Wt Readings from Last 3 Encounters:  09/20/19 200 lb (90.7 kg)  07/16/19 204 lb 3.2 oz (92.6 kg)  04/27/19 204 lb 6.4 oz (92.7 kg)     Exam deferred. Pt. Harboring due to COVID 19. Phone visit performed.   Assessment & Plan:   1. Dizziness   2. Eczema, unspecified type     Meds ordered this encounter  Medications  . predniSONE (DELTASONE) 5 MG tablet    Sig: Take 1 tablet (5 mg total) by mouth daily with breakfast.    Dispense:  30 tablet    Refill:  5    No orders of the defined types were placed in this encounter.     Diagnoses and all orders for this visit:  Dizziness  Eczema, unspecified type  Other orders -     predniSONE (DELTASONE) 5 MG tablet; Take 1 tablet (5 mg total) by mouth daily with breakfast.    Virtual Visit via telephone Note  I discussed the limitations, risks, security and privacy concerns of performing an evaluation and management service by telephone and the availability of in person appointments. The patient was identified with two identifiers. Pt.expressed understanding and  agreed to proceed. Pt. Is at home. Dr. Livia Snellen is in his office.  Follow Up Instructions:   I discussed the assessment and treatment plan with the patient. The patient was provided an opportunity to ask questions and all were answered. The patient agreed with the plan and demonstrated an understanding of the instructions.   The patient was advised to call back or seek an in-person evaluation if the symptoms worsen or if the condition fails to improve as anticipated.   Total minutes including chart review and phone contact time: 22   Follow up plan: No follow-ups on file.  Claretta Fraise, MD [place] [facility]

## 2019-10-12 ENCOUNTER — Telehealth: Payer: Self-pay | Admitting: Internal Medicine

## 2019-10-12 NOTE — Telephone Encounter (Signed)
Been feeling pretty well.  Saw neuro for dizziness, she thought it was his heart not pumping enough blood.   ENT said it was not his head. Taken atenolol 12.5 mg around 12:30  2:00  today    Sitting on porch, everything started getting dark. Wife helped him in the house, he was lightheaded, weak, head hurting on both sides, temples  No pounding or racing heart, no chest pain. 97/50, 51 Diarrhea every day for last 4 days.  Drinking about 50 oz water a day  Overall has been getting more active and feeling pretty well.   Doing yard work.  No significant palpitaitons. Had orthostatic BPsat neuro appointment per patient "it dropped about 20 points" Feels good right now  BP on this phone call: 128/64, 54  Encouraged him to drink extra fluids and change positions slowly.  Aware I will let Dr. Harrington Challenger know.  He will continue to monitor for further symptoms.

## 2019-10-12 NOTE — Telephone Encounter (Signed)
New Message      Pt c/o BP issue: STAT if pt c/o blurred vision, one-sided weakness or slurred speech  1. What are your last 5 BP readings? 97/50 HR 51   2. Are you having any other symptoms (ex. Dizziness, headache, blurred vision, passed out)? Felt like passing out, dizziness, blurred vision   3. What is your BP issue? Pt is calling and says he experienced low BP and HR  Today. He only wants to talk to [facility]   Please call

## 2019-10-17 MED ORDER — ATENOLOL 25 MG PO TABS: 12.5000 mg | ORAL_TABLET | Freq: Every day | ORAL | 3 refills | Status: DC

## 2019-10-17 NOTE — Telephone Encounter (Signed)
Called patient with Dr. Harrington Challenger' advisement. Patient will try taking atenolol 1/2 tablet daily and will give our office a call if he needs to take more for symptoms.

## 2019-10-17 NOTE — Telephone Encounter (Signed)
Take only 1/2 atenolol per day  Not 2.    FOllow symptoms   Could cut back further to 1/4

## 2019-10-23 ENCOUNTER — Ambulatory Visit: Payer: PPO | Attending: Family Medicine | Admitting: Physical Therapy

## 2019-10-23 ENCOUNTER — Other Ambulatory Visit: Payer: Self-pay

## 2019-10-23 ENCOUNTER — Encounter: Payer: Self-pay | Admitting: Physical Therapy

## 2019-10-23 DIAGNOSIS — R42 Dizziness and giddiness: Secondary | ICD-10-CM | POA: Diagnosis not present

## 2019-10-23 DIAGNOSIS — R2681 Unsteadiness on feet: Secondary | ICD-10-CM | POA: Diagnosis not present
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
## 2019-10-26 DIAGNOSIS — N5201 Erectile dysfunction due to arterial insufficiency: Secondary | ICD-10-CM | POA: Diagnosis not present

## 2019-10-26 DIAGNOSIS — Z8551 Personal history of malignant neoplasm of bladder: Secondary | ICD-10-CM | POA: Diagnosis not present

## 2019-11-07 ENCOUNTER — Telehealth: Payer: Self-pay | Admitting: Internal Medicine

## 2019-11-07 NOTE — Telephone Encounter (Signed)
Left detailed message since patient did not answer the phone.  Adv it is okay to take the 2nd half of atenolol in the evening if heart racing at night.  He has taken this in the past without any problems.  He cut back because he had been having less palpitations and wanted to try to decrease.  Asked him to call back if he has further questions or concerns.

## 2019-11-07 NOTE — Telephone Encounter (Signed)
Pt c/o medication issue:  1. Name of Medication: atenolol (TENORMIN) 25 MG tablet  2. How are you currently taking this medication (dosage and times per day)? 1/2 a tablet by mouth daily   3. Are you having a reaction (difficulty breathing--STAT)? No   4. What is your medication issue? Ronald Russell is calling stating since decreasing his atenolol he has noticed that his heart seems to race at night. He would like to know if Dr. Harrington Challenger thinks the dosage should be increased back to how he was originally taken it before the decrease. He is requesting [name] be the one to call him back in regards to this. Please advise.

## 2019-11-08 NOTE — Telephone Encounter (Addendum)
Patient wanted to ask if he has a bad heart. And should he take atenolol twice a day or is 1/2 once a day ok Also is he able to take sildenafil.  We discussed at length his heart history and to continue to take 1/2 atenolol daily and extra 1/2 only as needed for feeling extra beats.  He hasn't used sildenafil in a while but wanted to know how many tablets to take.  I adv to start with 2 and take additional if needed.

## 2019-11-08 NOTE — Telephone Encounter (Signed)
Ronald Russell is returning Ronald Russell's call with some questions. Please advise.

## 2019-11-15 ENCOUNTER — Ambulatory Visit: Payer: PPO | Attending: Family Medicine | Admitting: Physical Therapy

## 2019-11-15 ENCOUNTER — Other Ambulatory Visit: Payer: Self-pay

## 2019-11-15 VITALS — BP 142/69 | HR 64

## 2019-11-15 DIAGNOSIS — R2681 Unsteadiness on feet: Secondary | ICD-10-CM | POA: Diagnosis not present

## 2019-11-15 DIAGNOSIS — R42 Dizziness and giddiness: Secondary | ICD-10-CM | POA: Diagnosis not present
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
## 2019-11-20 ENCOUNTER — Ambulatory Visit: Payer: PPO | Admitting: Physical Therapy

## 2019-11-27 ENCOUNTER — Ambulatory Visit: Payer: PPO | Admitting: Physical Therapy

## 2019-12-03 ENCOUNTER — Ambulatory Visit (INDEPENDENT_AMBULATORY_CARE_PROVIDER_SITE_OTHER): Payer: PPO | Admitting: *Deleted

## 2019-12-03 DIAGNOSIS — Z0000 Encounter for general adult medical examination without abnormal findings: Secondary | ICD-10-CM
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
## 2019-12-03 NOTE — Patient Instructions (Signed)

## 2019-12-12 ENCOUNTER — Other Ambulatory Visit: Payer: Self-pay

## 2019-12-12 ENCOUNTER — Other Ambulatory Visit: Payer: PPO | Admitting: *Deleted

## 2019-12-12 DIAGNOSIS — E559 Vitamin D deficiency, unspecified: Secondary | ICD-10-CM

## 2019-12-13 LAB — VITAMIN D 25 HYDROXY (VIT D DEFICIENCY, FRACTURES): Vit D, 25-Hydroxy: 40.9 ng/mL (ref 30.0–100.0)

## 2019-12-19 ENCOUNTER — Telehealth: Payer: Self-pay | Admitting: Internal Medicine

## 2019-12-19 ENCOUNTER — Ambulatory Visit (INDEPENDENT_AMBULATORY_CARE_PROVIDER_SITE_OTHER): Payer: PPO | Admitting: Family Medicine

## 2019-12-19 ENCOUNTER — Other Ambulatory Visit: Payer: Self-pay

## 2019-12-19 ENCOUNTER — Encounter: Payer: Self-pay | Admitting: Family Medicine

## 2019-12-19 VITALS — BP 137/77 | HR 61 | Temp 97.3°F | Resp 20 | Ht 72.0 in | Wt 200.4 lb

## 2019-12-19 DIAGNOSIS — E782 Mixed hyperlipidemia: Secondary | ICD-10-CM

## 2019-12-19 DIAGNOSIS — R002 Palpitations: Secondary | ICD-10-CM

## 2019-12-19 DIAGNOSIS — I10 Essential (primary) hypertension: Secondary | ICD-10-CM

## 2019-12-19 DIAGNOSIS — N529 Male erectile dysfunction, unspecified: Secondary | ICD-10-CM

## 2019-12-19 DIAGNOSIS — K591 Functional diarrhea: Secondary | ICD-10-CM

## 2019-12-19 DIAGNOSIS — Z9049 Acquired absence of other specified parts of digestive tract: Secondary | ICD-10-CM | POA: Diagnosis not present

## 2019-12-19 MED ORDER — SILDENAFIL CITRATE 20 MG PO TABS: 20.0000 mg | ORAL_TABLET | Freq: Every day | ORAL | 1 refills | Status: DC | PRN

## 2019-12-19 MED ORDER — COLESTIPOL HCL 1 G PO TABS: 3.0000 g | ORAL_TABLET | Freq: Two times a day (BID) | ORAL | 2 refills | Status: DC

## 2019-12-19 NOTE — Progress Notes (Signed)
Subjective:  Patient ID: Ronald Russell., male    DOB: Aug 12, 1934  Age: 85 y.o. MRN: [id number]  CC: No chief complaint on file.   HPI MARIA GALLICCHIO Sr. presents for recurrent diarrhea has been ongoing for about 6 weeks.  He has a normal bowel movement in the morning and about 5 hours later will have a watery bowel movement with multiple episodes.  Is followed by weak spells and palpitations afterwards.  It is worse with eating spicy or fatty foods.  He mentions tomatoes and ice cream.  He has palpitations with chocolate and coffee as well.  Depression screen [facility] 2/9 12/19/2019 12/03/2019 11/02/2018  Decreased Interest 0 0 0  Down, Depressed, Hopeless 0 1 0  PHQ - 2 Score 0 1 0  Altered sleeping - - -  Tired, decreased energy - - -  Change in appetite - - -  Feeling bad or failure about yourself  - - -  Trouble concentrating - - -  Moving slowly or fidgety/restless - - -  Suicidal thoughts - - -  PHQ-9 Score - - -  Difficult doing work/chores - - -  Some recent data might be hidden    History Murray has a past medical history of Allergy, Anxiety, Bladder cancer ([place]) (2010), CAD (coronary artery disease), Chronic bronchitis ([place]), GERD (gastroesophageal reflux disease), History of blood transfusion (1949), HOH (hard of hearing), Hypertension, Ocular migraine, Pneumonia, Seasonal allergies, Shingles, Temporal arteritis ([place]), and Thrombocytopenia ([place]) (11/29/2016).   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   His family history includes Alzheimer's disease in his  father; Cancer in his mother, sister, and sister.He reports that he has quit smoking. His smoking use included cigarettes. He has a 2.25 pack-year smoking history. He has never used smokeless tobacco. He reports that he does not drink alcohol and does not use drugs.    ROS Review of Systems  Constitutional: Negative for fever.  Respiratory: Negative for shortness of breath.   Cardiovascular: Negative for chest pain.  Gastrointestinal: Positive for diarrhea. Negative for constipation, nausea and vomiting.  Musculoskeletal: Negative for arthralgias.  Skin: Negative for rash.    Objective:  BP 137/77   Pulse 61   Temp (!) 97.3 F (36.3 C) (Temporal)   Resp 20   Ht 6' (1.829 m)   Wt 200 lb 6 oz (90.9 kg)   SpO2 96%   BMI 27.18 kg/m   BP Readings from Last 3 Encounters:  12/19/19 137/77  11/15/19 (!) 142/69  09/20/19 123/68    Wt Readings from Last 3 Encounters:  12/19/19 200 lb 6 oz (90.9 kg)  09/20/19 200 lb (90.7 kg)  07/16/19 204 lb 3.2 oz (92.6 kg)     Physical Exam Vitals reviewed.  Constitutional:      Appearance: He is well-developed.  HENT:     Head: Normocephalic and atraumatic.     Right Ear: External ear normal.     Left Ear: External ear normal.     Mouth/Throat:     Pharynx: No oropharyngeal exudate or posterior oropharyngeal erythema.  Eyes:     Pupils: Pupils are equal, round, and reactive to light.  Cardiovascular:  Rate and Rhythm: Normal rate and regular rhythm.     Heart sounds: No murmur heard.   Pulmonary:     Effort: No respiratory distress.     Breath sounds: Normal breath sounds.  Musculoskeletal:     Cervical back: Normal range of motion and neck supple.  Neurological:     Mental Status: He is alert and oriented to person, place, and time.       Assessment & Plan:   Diagnoses and all orders for this visit:  Functional diarrhea -     colestipol (COLESTID) 1 g tablet; Take 3 tablets (3 g total) by mouth 2 (two) times  daily. -     CMP14+EGFR  HTN (hypertension), benign -     CMP14+EGFR  Palpitations -     CMP14+EGFR  S/P laparoscopic cholecystectomy -     CMP14+EGFR  Mixed hyperlipidemia -     CMP14+EGFR -     Lipid panel  Vasculogenic erectile dysfunction, unspecified vasculogenic erectile dysfunction type -     sildenafil (REVATIO) 20 MG tablet; Take 1 tablet (20 mg total) by mouth daily as needed (2-5 as needed).       I am having Jaquell Seddon. [place] Sr. start on colestipol. I am also having him maintain his aspirin EC, CALCIUM-MAGNESIUM-VITAMIN D PO, Potassium Gluconate, acetaminophen, cetirizine, neomycin-polymyxin-hydrocortisone, Vitamin D (Cholecalciferol), fluticasone, fluocinonide cream, pantoprazole, atenolol, and sildenafil.  Allergies as of 12/19/2019      Reactions   Uloric [febuxostat] Palpitations, Other (See Comments), Hypertension   Hypertension with palpitations and a sense of fuzziness and dizziness at the left side of his head   Doxazosin Diarrhea   Augmentin [amoxicillin-pot Clavulanate] Diarrhea   Ciprofloxacin Diarrhea   Codeine Nausea And Vomiting, Other (See Comments)   Severe headache   Levaquin [levofloxacin] Diarrhea   Oxycodone Nausea And Vomiting   Tramadol Nausea And Vomiting   Vicodin [hydrocodone-acetaminophen] Nausea And Vomiting      Medication List       Accurate as of December 19, 2019 11:59 PM. If you have any questions, ask your nurse or doctor.        acetaminophen 650 MG CR tablet Commonly known as: TYLENOL Take 650 mg by mouth every 6 (six) hours as needed for pain.   aspirin EC 81 MG tablet Take 1 tablet (81 mg total) by mouth daily.   atenolol 25 MG tablet Commonly known as: TENORMIN Take 0.5 tablets (12.5 mg total) by mouth daily.   CALCIUM-MAGNESIUM-VITAMIN D PO Take 2 tablets by mouth at bedtime.   cetirizine 10 MG tablet Commonly known as: ZYRTEC TAKE 1 TABLET BY MOUTH EVERY DAY   colestipol 1 g tablet Commonly known as:  COLESTID Take 3 tablets (3 g total) by mouth 2 (two) times daily. Started by: Claretta Fraise, MD   fluocinonide cream 0.05 % Commonly known as: LIDEX APPLY TO AFFECTED AREA TWICE A DAY   fluticasone 50 MCG/ACT nasal spray Commonly known as: FLONASE Place 2 sprays into both nostrils daily.   neomycin-polymyxin-hydrocortisone 3.5-10000-1 OTIC suspension Commonly known as: CORTISPORIN PLACE 4 5 DROPS INTO AFFECTED EAR TWICE DAILY FOR 5 DAYS AS NEEDED FOR DRAINAGE   pantoprazole 40 MG tablet Commonly known as: PROTONIX TAKE 1 TABLET BY MOUTH TWICE A DAY   Potassium Gluconate 550 MG Tabs Take 1 tablet (550 mg total) by mouth at bedtime. Need to take 1 tab one day and 2 the next day alternating   sildenafil 20 MG tablet Commonly known as:  REVATIO Take 1 tablet (20 mg total) by mouth daily as needed (2-5 as needed).   Vitamin D (Cholecalciferol) 10 MCG (400 UNIT) Caps Take 400 Units by mouth daily.        Follow-up: No follow-ups on file.  Claretta Fraise, M.D.

## 2019-12-19 NOTE — Telephone Encounter (Signed)
Patient called to find out if only his Vit D was ordered at his last lab test. I advised him that's was all was ordered.

## 2019-12-21 ENCOUNTER — Other Ambulatory Visit: Payer: Self-pay

## 2019-12-21 ENCOUNTER — Telehealth: Payer: Self-pay | Admitting: Internal Medicine

## 2019-12-21 ENCOUNTER — Other Ambulatory Visit: Payer: PPO

## 2019-12-21 DIAGNOSIS — E875 Hyperkalemia: Secondary | ICD-10-CM

## 2019-12-21 LAB — BMP8+EGFR
BUN/Creatinine Ratio: 10 (ref 10–24)
BUN: 15 mg/dL (ref 8–27)
CO2: 23 mmol/L (ref 20–29)
Calcium: 8.9 mg/dL (ref 8.6–10.2)
Chloride: 107 mmol/L — ABNORMAL HIGH (ref 96–106)
Creatinine, Ser: 1.46 mg/dL — ABNORMAL HIGH (ref 0.76–1.27)
GFR calc Af Amer: 51 mL/min/{1.73_m2} — ABNORMAL LOW (ref >59)
GFR calc non Af Amer: 44 mL/min/{1.73_m2} — ABNORMAL LOW (ref >59)
Glucose: 95 mg/dL (ref 65–99)
Potassium: 3.9 mmol/L (ref 3.5–5.2)
Sodium: 139 mmol/L (ref 134–144)

## 2019-12-21 LAB — CMP14+EGFR
ALT: 14 IU/L (ref 0–44)
AST: 17 IU/L (ref 0–40)
Albumin/Globulin Ratio: 1.8 (ref 1.2–2.2)
Albumin: 4.1 g/dL (ref 3.6–4.6)
Alkaline Phosphatase: 51 IU/L (ref 48–121)
BUN/Creatinine Ratio: 12 (ref 10–24)
BUN: 15 mg/dL (ref 8–27)
Bilirubin Total: 0.5 mg/dL (ref 0.0–1.2)
CO2: 21 mmol/L (ref 20–29)
Calcium: 8.7 mg/dL (ref 8.6–10.2)
Chloride: 104 mmol/L (ref 96–106)
Creatinine, Ser: 1.3 mg/dL — ABNORMAL HIGH (ref 0.76–1.27)
GFR calc Af Amer: 58 mL/min/{1.73_m2} — ABNORMAL LOW (ref >59)
GFR calc non Af Amer: 50 mL/min/{1.73_m2} — ABNORMAL LOW (ref >59)
Globulin, Total: 2.3 g/dL (ref 1.5–4.5)
Glucose: 74 mg/dL (ref 65–99)
Potassium: 6.6 mmol/L (ref 3.5–5.2)
Sodium: 137 mmol/L (ref 134–144)
Total Protein: 6.4 g/dL (ref 6.0–8.5)

## 2019-12-21 LAB — LIPID PANEL
Chol/HDL Ratio: 5 ratio (ref 0.0–5.0)
Cholesterol, Total: 165 mg/dL (ref 100–199)
HDL: 33 mg/dL — ABNORMAL LOW (ref >39)
LDL Chol Calc (NIH): 102 mg/dL — ABNORMAL HIGH (ref 0–99)
Triglycerides: 171 mg/dL — ABNORMAL HIGH (ref 0–149)
VLDL Cholesterol Cal: 30 mg/dL (ref 5–40)

## 2019-12-21 NOTE — Telephone Encounter (Signed)
Pt is calling to pass some information along to Dr. Harrington Challenger and Nurse. Pt states on 7/7 he went into see his PCP Dr. Livia Snellen, and labs were drawn.  Everything came back ok, except for his potassium level, which came back at 6.6.  Pt states Dr. Livia Snellen advised him to hold his potassium supplement and come back in for STAT BMET at their office today.  Pt did go into his PCP office today to have his K level rechecked.  Pt just wanted to keep Dr. Harrington Challenger in the loop about this.  He states he should hear from Dr. Livia Snellen office before the end of the day today, to find out if his K level has decreased since 7/7.  Pt states if he hasn't heard from his PCP by 4:30 pm today, then he will call their office to inquire his results.  Informed the pt that I agree with plan mentioned above, and he should follow-up if he doesn't hear from them, by the end of the day.  Advised him to proceed with medication instruction, as advised by his PCP.  Informed the pt that Dr. Harrington Challenger is out of the office at this time, but I will route this message to both her and her Nurse for further review upon return to the office.  Pt verbalized understanding and agrees with this plan. Pt was gracious for all the assistance provided.

## 2019-12-21 NOTE — Telephone Encounter (Signed)
Ronald Russell is calling wanting to know why Dr. Harrington Challenger didn't run labs checking his potassium level when he last came in. He states he recently went to his PCP and they stated it his high. He is requesting a nurse call to discuss. Please advise.

## 2019-12-23 ENCOUNTER — Encounter: Payer: Self-pay | Admitting: Family Medicine

## 2020-01-18 ENCOUNTER — Ambulatory Visit (INDEPENDENT_AMBULATORY_CARE_PROVIDER_SITE_OTHER): Payer: PPO | Admitting: Otolaryngology

## 2020-01-18 ENCOUNTER — Other Ambulatory Visit: Payer: Self-pay

## 2020-01-18 VITALS — Temp 97.5°F

## 2020-01-18 DIAGNOSIS — R42 Dizziness and giddiness: Secondary | ICD-10-CM

## 2020-01-18 DIAGNOSIS — J310 Chronic rhinitis: Secondary | ICD-10-CM

## 2020-01-18 DIAGNOSIS — H6123 Impacted cerumen, bilateral: Secondary | ICD-10-CM | POA: Diagnosis not present
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
## 2020-02-01 ENCOUNTER — Other Ambulatory Visit: Payer: Self-pay

## 2020-02-01 ENCOUNTER — Encounter: Payer: Self-pay | Admitting: Internal Medicine

## 2020-02-01 ENCOUNTER — Ambulatory Visit: Payer: PPO | Admitting: Internal Medicine

## 2020-02-01 VITALS — BP 118/60 | HR 55 | Ht 72.0 in | Wt 198.4 lb

## 2020-02-01 DIAGNOSIS — R42 Dizziness and giddiness: Secondary | ICD-10-CM

## 2020-02-01 MED ORDER — OMEPRAZOLE 20 MG PO CPDR
20.0000 mg | DELAYED_RELEASE_CAPSULE | Freq: Every day | ORAL | 11 refills | Status: DC
Start: 2020-02-01 — End: 2020-03-05

## 2020-02-01 NOTE — Patient Instructions (Signed)
Medication Instructions:  Start Omeprazole 20 mg daily Stop Protonix (pantoprazole)  *If you need a refill on your cardiac medications before your next appointment, please call your pharmacy*   Lab Work: none If you have labs (blood work) drawn today and your tests are completely normal, you will receive your results only by: Marland Kitchen MyChart Message (if you have MyChart) OR . A paper copy in the mail If you have any lab test that is abnormal or we need to change your treatment, we will call you to review the results.   Testing/Procedures: none   Follow-Up: At [facility], you and your health needs are our priority.  As part of our continuing mission to provide you with exceptional heart care, we have created designated Provider Care Teams.  These Care Teams include your primary Cardiologist (physician) and Advanced Practice Providers (APPs -  Physician Assistants and Nurse Practitioners) who all work together to provide you with the care you need, when you need it.  Your next appointment:   9 month(s)  --April/May  The format for your next appointment:   In Person  Provider:   You may see Dorris Carnes, MD or one of the following Advanced Practice Providers on your designated Care Team:    Richardson Dopp, PA-C  Robbie Lis, [place]    Other Instructions

## 2020-02-01 NOTE — Progress Notes (Signed)
Cardiology Office Note:    Date:  02/01/2020   ID:  Ronald Fear Sr., DOB [birth year], MRN [id number]  PCP:  Claretta Fraise, MD  Cardiologist:  Dorris Carnes, MD   Electrophysiologist:  None   Referring MD: Claretta Fraise, MD   F/U of CAD     History of Present Illness:    Ronald Worlds. is a 85 y.o. male with coronary artery disease status post CABG in June 9518 complicated by postoperative atrial fibrillation  bladder cancer, temporal arteritis, hypertension, hyperlipidemia.   The patient has had a long history of palpitations.  Event monitor in the past has demonstrated symptomatic PACs.  He has continued to have worsening palpitations since his bypass surgery.  Multiple medication changes have been made.  64 Hr Holter in 02/2018 demonstrated normal sinus rhythm with avg HR 71, occasional PVCs and PACs.    Echo in July 2020 LVEF normal   Event monitor in Aug showed SR with PACs   He has been maintained on atenolol and I added diltiazem    He inncreased to bid but heart rate went back into 40s   He got nervous and pulled back     I saw th patient  In clinic November 2020 The patient has had intermittent palpitations.   He did not like being on diltiazem   Prefers b blocker      He continues to have dizziness   He says that he has had ear issues for a long time   He has drops for ears  ( from [facility], ENT)   When he uses the drops his symptoms of dizziness subsides   He has not   Since I saw the patient he continues to have dizziness.  He has been seen by Dr. Lucia Gaskins and Dr. Lavell Anchors.  Neither can figure out the cause.  He went to balance rehab.  This is not really helping.   He says he did have about 12 hours yesterday when he did not have any dizziness, felt good   Then it came back  Talking to the patient he said it is not like vertigo it is often worse on the left side of his head.  He points to the temple area.  It is associated with the smell of old cigarettes.  He denies frank  syncope.  He would like to come off Protonix; he prefers to take  omeprazole 20 twice daily.  He could not tolerate statins.  He denies chest pain.  Breathing is okay/stable.  Prior CV studies:   The following studies were reviewed today:  28 Hr Holter 02/2018 Sinus rhythm   51 to 106 bpm   Average HR 71 bpm Occasional PVC  Occasional PAC Longest pause 1.5 sec Diary entry associated with SR and also SR with  PAC  Pre CABG Dopplers 12/04/17  Final Interpretation: Right Carotid: The extracranial vessels were near-normal with only minimal wall thickening or plaque. Left Carotid: The extracranial vessels were near-normal with only minimal wall thickening or plaque. Vertebrals:  Bilateral vertebral arteries demonstrate antegrade flow. Subclavians: Normal flow hemodynamics were seen in bilateral subclavian arteries. Right Upper Extremity: Doppler waveforms remain within normal limits with compression. Doppler waveforms remain within normal limits with compression. Left Upper Extremity: Doppler waveforms remain within normal limits with compression. Doppler waveforms remain within normal limits with compression.   Cardiac Catheterization 12/02/17 LAD prox 75, mid 100 (R-L collats); D2 75 LCx prox 80, mid 70 RCA mild  disease EF 55-65   Nuclear stress test 11/24/17 Intermediate risk stress nuclear study with a new area of mild ischemia in the distal LAD artery territory. Normal left ventricular regional and global systolic function. EF 85   Event Monitor 08/2016 Sinus rhythm with PACs  Sensed as skips     Echo 09/01/16 EF 60-65, no RWMA, Gr 2 DD   Past Medical History:  Diagnosis Date  . Allergy   . Anxiety   . Bladder cancer ([place]) 2010   Tx with BCG  . CAD (coronary artery disease)    LHC 6/19: pLAD 75, mLAD 100, D2 75; pLCx 58, mLCx 70, EF 55-65 >> s/p CABG // Echo 3/18: EF 60-65, Gr 2 DD  . Chronic bronchitis ([place])    "yearly the last 3 yrs" (02/13/2013)  . GERD  (gastroesophageal reflux disease)   . History of blood transfusion 1949   "seeral" (02/13/2013)  . HOH (hard of hearing)   . Hypertension   . Ocular migraine    "not often" (02/13/2013)  . Pneumonia    "last time ~ 2 yr ago; had it before that too" (02/13/2013)  . Seasonal allergies   . Shingles    has neuropathy since it happened in 6/17  . Temporal arteritis ([place])   . Thrombocytopenia ([place]) 11/29/2016   Surgical Hx: The patient  has a past surgical history that includes Transurethral resection of bladder tumor (2010 X 3); mastoid tumor removed (Right, 1964); Tonsillectomy (1940's); Skin graft (Right, 1949); Colonoscopy; Vasectomy; Artery Biopsy (Left, 01/09/2013); Cardiovascular stress test (07/2011); Cholecystectomy (N/A, 11/30/2016); LEFT HEART CATH AND CORONARY ANGIOGRAPHY (N/A, 12/02/2017); Coronary artery bypass graft (N/A, 12/07/2017); and TEE without cardioversion (N/A, 12/07/2017).   Current Medications: Current Meds  Medication Sig  . acetaminophen (TYLENOL) 650 MG CR tablet Take 650 mg by mouth every 6 (six) hours as needed for pain.  Marland Kitchen aspirin EC 81 MG tablet Take 1 tablet (81 mg total) by mouth daily.  Marland Kitchen atenolol (TENORMIN) 25 MG tablet Take 0.5 tablets (12.5 mg total) by mouth daily.  Marland Kitchen CALCIUM-MAGNESIUM-VITAMIN D PO Take 2 tablets by mouth at bedtime.   . colestipol (COLESTID) 1 g tablet Take 3 tablets (3 g total) by mouth 2 (two) times daily. (Patient taking differently: Take 1 g by mouth 2 (two) times daily. )  . fluocinonide cream (LIDEX) 0.05 % APPLY TO AFFECTED AREA TWICE A DAY  . fluticasone (FLONASE) 50 MCG/ACT nasal spray Place 2 sprays into both nostrils daily.  . pantoprazole (PROTONIX) 40 MG tablet TAKE 1 TABLET BY MOUTH TWICE A DAY  . Potassium Gluconate 550 MG TABS Take 1 tablet (550 mg total) by mouth at bedtime. Need to take 1 tab one day and 2 the next day alternating  . sildenafil (REVATIO) 20 MG tablet Take 1 tablet (20 mg total) by mouth daily as needed (2-5 as  needed).  . Vitamin D, Cholecalciferol, 10 MCG (400 UNIT) CAPS Take 400 Units by mouth daily.     Allergies:   Uloric [febuxostat], Doxazosin, Augmentin [amoxicillin-pot clavulanate], Ciprofloxacin, Codeine, Levaquin [levofloxacin], Oxycodone, Tramadol, and Vicodin [hydrocodone-acetaminophen]   Social History   Tobacco Use  . Smoking status: Former Smoker    Packs/day: 0.75    Years: 3.00    Pack years: 2.25    Types: Cigarettes  . Smokeless tobacco: Never Used  . Tobacco comment: 02/13/2013 "quit smoking age 54"  Vaping Use  . Vaping Use: Never used  Substance Use Topics  . Alcohol use: No  Comment: 02/13/2013 "probably a pint of whiskey/wk up til I was probably 85 yr old"  . Drug use: No     Family Hx: The patient's family history includes Alzheimer's disease in his father; Cancer in his mother, sister, and sister. There is no history of Anemia, Arrhythmia, Asthma, Clotting disorder, Fainting, Heart attack, Heart disease, Heart failure, Hyperlipidemia, Hypertension, or Migraines.  ROS:   Please see the history of present illness.    All other systems reviewed and are negative.   EKGs/Labs/Other Test Reviewed:    EKG:   Sinus bradycardia 55 bpm  First degree AV block RBBB  Recent Labs: 07/18/2019: TSH 4.470 12/19/2019: ALT 14 12/21/2019: BUN 15; Creatinine, Ser 1.46; Potassium 3.9; Sodium 139   Recent Lipid Panel Lab Results  Component Value Date/Time   CHOL 165 12/19/2019 05:09 PM   TRIG 171 (H) 12/19/2019 05:09 PM   HDL 33 (L) 12/19/2019 05:09 PM   CHOLHDL 5.0 12/19/2019 05:09 PM   CHOLHDL 3.3 12/02/2017 12:53 AM   LDLCALC 102 (H) 12/19/2019 05:09 PM    Physical Exam:    VS:  BP 118/60   Pulse (!) 55   Ht 6' (1.829 m)   Wt 198 lb 6.4 oz (90 kg)   BMI 26.91 kg/m     Wt Readings from Last 3 Encounters:  02/01/20 198 lb 6.4 oz (90 kg)  12/19/19 200 lb 6 oz (90.9 kg)  09/20/19 200 lb (90.7 kg)   Gen:   Pt is in NAD  Neck:   JVP is normal    lungs:   Clear to  ausculation  No rales   Cardiac RRR S1, S2  No S3    No signific murmurs  Abd   Supple   No hepatomegaly   No masses  Ext   N LE   edema     2+ pulses     EKG  SB 55  First degree AV block  RBBB  ASSESSMENT & PLAN:    Dizziness   Pt has had a long hx   He  Has not been orthostatic on exam in past   Did have about 12 hours without it   I am not sure of causes   Sounds more related to ENT    2  Palpitations    Pt without siginifcant complaints   Follow  Keep on same meds   3  Coronary artery disease iNo symptoms of angina   S/p CABG in 11/2017.   Continue ASA, statin, beta-blocker.    4 Mixed hyperlipidemia   LDL in jUly 2020 was 74  HDL 34    Keep on current regimen    5  HTN  Keep on current regimen   Medication Adjustments/Labs and Tests Ordered: Current medicines are reviewed at length with the patient today.  Concerns regarding medicines are outlined above.  Tests Ordered: No orders of the defined types were placed in this encounter.  Medication Changes: No orders of the defined types were placed in this encounter.   Signed, Dorris Carnes, MD  02/01/2020 2:43 PM    [address], [place], [place]  [id number] Phone: [phone]; Fax: [phone]

## 2020-03-05 ENCOUNTER — Ambulatory Visit (INDEPENDENT_AMBULATORY_CARE_PROVIDER_SITE_OTHER): Payer: PPO | Admitting: Family Medicine

## 2020-03-05 DIAGNOSIS — K219 Gastro-esophageal reflux disease without esophagitis: Secondary | ICD-10-CM | POA: Diagnosis not present

## 2020-03-05 MED ORDER — LANSOPRAZOLE 15 MG PO CPDR: 15.0000 mg | DELAYED_RELEASE_CAPSULE | Freq: Every day | ORAL | 12 refills | Status: DC

## 2020-03-05 MED ORDER — FAMOTIDINE 20 MG PO TABS: 20.0000 mg | ORAL_TABLET | Freq: Every day | ORAL | 3 refills | Status: DC | PRN

## 2020-03-05 NOTE — Progress Notes (Signed)
Telephone visit  Subjective: CC: stomach problems PCP: Claretta Fraise, MD LFY:BOFBPZ C [name]. is a 85 y.o. male calls for telephone consult today. Patient provides verbal consent for consult held via phone.  Due to COVID-19 pandemic this visit was conducted virtually. This visit type was conducted due to national recommendations for restrictions regarding the COVID-19 Pandemic (e.g. social distancing, sheltering in place) in an effort to limit this patient's exposure and mitigate transmission in our community. All issues noted in this document were discussed and addressed.  A physical exam was not performed with this format.   Location of patient: home Location of provider: [facility] Others present for call: none  1. Stomach problems Patient has been seeing ENT, PCP for dizziness and chemical taste in mouth.  He saw cardiology recently and they recommended he stop Omeprazole.  He notes resolution of dizziness and funny taste in mouth.  He has only been using PPI prn but when he uses it dizzy symptoms return. He tried using Pepcid but this was not helpful.  He has used a max of 40mg  at the direction of his pharmacist.  He has previously tried Pantoprazole 40mg  but that was discontinued for an unknown reason.  No hematochezia/ melena.   ROS: Per HPI  Allergies  Allergen Reactions   Uloric [Febuxostat] Palpitations, Other (See Comments) and Hypertension    Hypertension with palpitations and a sense of fuzziness and dizziness at the left side of his head   Doxazosin Diarrhea   Augmentin [Amoxicillin-Pot Clavulanate] Diarrhea   Ciprofloxacin Diarrhea   Codeine Nausea And Vomiting and Other (See Comments)    Severe headache   Levaquin [Levofloxacin] Diarrhea   Oxycodone Nausea And Vomiting   Tramadol Nausea And Vomiting   Vicodin [Hydrocodone-Acetaminophen] Nausea And Vomiting   Past Medical History:  Diagnosis Date   Allergy    Anxiety    Bladder cancer ([place]) 2010   Tx with  BCG   CAD (coronary artery disease)    LHC 6/19: pLAD 75, mLAD 100, D2 75; pLCx 80, mLCx 70, EF 55-65 >> s/p CABG // Echo 3/18: EF 60-65, Gr 2 DD   Chronic bronchitis ([place])    "yearly the last 3 yrs" (02/13/2013)   GERD (gastroesophageal reflux disease)    History of blood transfusion 1949   "seeral" (02/13/2013)   HOH (hard of hearing)    Hypertension    Ocular migraine    "not often" (02/13/2013)   Pneumonia    "last time ~ 2 yr ago; had it before that too" (02/13/2013)   Seasonal allergies    Shingles    has neuropathy since it happened in 6/17   Temporal arteritis (H[facility])    Thrombocytopenia ([place]) 11/29/2016    Current Outpatient Medications:    acetaminophen (TYLENOL) 650 MG CR tablet, Take 650 mg by mouth every 6 (six) hours as needed for pain., Disp: , Rfl:    aspirin EC 81 MG tablet, Take 1 tablet (81 mg total) by mouth daily., Disp: 90 tablet, Rfl: 3   atenolol (TENORMIN) 25 MG tablet, Take 0.5 tablets (12.5 mg total) by mouth daily., Disp: 45 tablet, Rfl: 3   CALCIUM-MAGNESIUM-VITAMIN D PO, Take 2 tablets by mouth at bedtime. , Disp: , Rfl:    colestipol (COLESTID) 1 g tablet, Take 3 tablets (3 g total) by mouth 2 (two) times daily. (Patient taking differently: Take 1 g by mouth 2 (two) times daily. ), Disp: 180 tablet, Rfl: 2   fluocinonide cream (LIDEX) 0.05 %,  APPLY TO AFFECTED AREA TWICE A DAY, Disp: 30 g, Rfl: 5   fluticasone (FLONASE) 50 MCG/ACT nasal spray, Place 2 sprays into both nostrils daily., Disp: 16 g, Rfl: 6   omeprazole (PRILOSEC) 20 MG capsule, Take 1 capsule (20 mg total) by mouth daily., Disp: 30 capsule, Rfl: 11   Potassium Gluconate 550 MG TABS, Take 1 tablet (550 mg total) by mouth at bedtime. Need to take 1 tab one day and 2 the next day alternating, Disp: 30 tablet, Rfl: 0   sildenafil (REVATIO) 20 MG tablet, Take 1 tablet (20 mg total) by mouth daily as needed (2-5 as needed)., Disp: 150 tablet, Rfl: 1   Vitamin D, Cholecalciferol, 10  MCG (400 UNIT) CAPS, Take 400 Units by mouth daily., Disp: 100 capsule, Rfl: 12  Assessment/ Plan: 85 y.o. male   1. GERD without esophagitis Stop omeprazole.  Stop pepcid 40mg  given renal function.  Follow up prn. - lansoprazole (PREVACID) 15 MG capsule; Take 1 capsule (15 mg total) by mouth daily. STOP omeprazole (for acid reflux)  Dispense: 30 capsule; Refill: 12 - famotidine (PEPCID) 20 MG tablet; Take 1 tablet (20 mg total) by mouth daily as needed for heartburn or indigestion.  Dispense: 30 tablet; Refill: 3  Start time: 2:08pm End time: 2:21pm  Total time spent on patient care (including telephone call/ virtual visit): 17 minutes  Ronald Russell [phone]

## 2020-03-11 ENCOUNTER — Other Ambulatory Visit: Payer: Self-pay | Admitting: Family Medicine

## 2020-03-12 ENCOUNTER — Telehealth: Payer: Self-pay | Admitting: Internal Medicine

## 2020-03-12 NOTE — Telephone Encounter (Signed)
Patient wanted to deliver a message to Dr. Harrington Challenger or nurse. Stated that he thinks he knows what is causing him to get swimmy headed. Thinks that it is because of his stomach medication or atenolol (TENORMIN) 25 MG tablet. Wants to know what would happen if he stopped taking it for awhile. Please call to discuss

## 2020-03-13 NOTE — Telephone Encounter (Signed)
No answer, VM not set up. Did not reach patient. Left message on home answer machine (DPR) that ok to stop atenolol as this is a very low dose and to follow for dizziness or worsening palpitations.

## 2020-03-13 NOTE — Telephone Encounter (Signed)
The patient would like to know if he would have a heart attack if he stops taking atenolol.  He was swimmy headed yesterday and wonders if it is from atenolol or the stomach meds he was changed to recently.  He is going to continue to take 12.5 mg daily and I reminded him that he gets palpitations that are bothersome when he does not take atenolol.  He reports an episode of chest tightness for about 15 min 2 days ago after sexual activity.  There were no other symptoms at the time.  He has been out walking and spreading fertilizer on his lawn with no trouble.  Overall "feeling great".    I adv to monitor for further episodes of chest tightness or change in symptoms.  To Dr. Harrington Challenger for review.

## 2020-03-13 NOTE — Telephone Encounter (Signed)
Patient can stop atenolol  He is on a very low dose   It was prescribed for palpitaitons  Follow response (dizziness, worsening palpitations)

## 2020-03-17 ENCOUNTER — Telehealth: Payer: Self-pay

## 2020-03-17 NOTE — Telephone Encounter (Signed)
Patient states that he was prescribed pepcid and prevacid. Cardiology advised patient not to take famotidine due to slightly elevated creatinine.  Patient also states that the prevacid makes him "swimmy headed".  Patient states that the pepcid worked taking 20mg  twice daily.  Patient would like to know if Dr. Livia Snellen thinks its ok to take pepcid with creatinine slightly elevated.

## 2020-03-17 NOTE — Telephone Encounter (Signed)
I recommend he continue to pepcid (famotidine). I checked his bloodwork and the kidney function looks about average for a man of 85 years, if not better

## 2020-03-17 NOTE — Telephone Encounter (Signed)
Patient aware.

## 2020-03-20 ENCOUNTER — Telehealth: Payer: Self-pay | Admitting: Internal Medicine

## 2020-03-20 NOTE — Telephone Encounter (Signed)
Attempted to call the pt back to endorse recommendations about his famotidine dose, per our Pharmacist Raquel.  Pt did not answer and voicemail is not set up.

## 2020-03-20 NOTE — Telephone Encounter (Signed)
Patient had some questions about his medications that he wanted to talk specifically with [name] about. It is not urgent but [name] knows all of his medical history

## 2020-03-20 NOTE — Telephone Encounter (Signed)
Pt is calling to ask Dr. Harrington Challenger if it is safe for him to take famotidine 20 mg po bid as needed for reflex.  Pt states his PCP advised him that it was safe for him to proceed with this dose with history of elevated renal function.  Pt states he cannot take his Prevacid anymore, for it was causing him dizziness, and his PCP advised to stop this and start taking famotidine instead.  Pt states he is aware that famotidine can cause worsening renal function, and he wants to be reassured by Dr. Harrington Challenger, that it is safe for him to proceed with taking famotidine 20 mg po bid PRN for GERD, until he can see his new GI MD on 03/31/20.  Pt states he really depends on this reflex medicine, for he states he has terrible acid reflux/GERD.  Pt also wanted to let Dr. Harrington Challenger know that he increased his water intake to 50 oz per day, as she last advised him to do.  Informed the pt that Dr. Harrington Challenger and Caren Hazy RN are out of the office today, but I will route this message to them for further review and recommendation, and follow-up with the pt thereafter.  Also informed the pt that I will run this message by our Pharmacist as well, to get their input. Pt verbalized understanding and agrees with this plan. Will remove Prevacid from his med list, being his PCP stopped this med.

## 2020-03-20 NOTE — Telephone Encounter (Signed)
Based on his current renal function I will recommend decreasing dose to famotidine 20mg  daily instead of BID. Okay to take twice daily for 48hrs to decrease GERD symptoms, but the gpo back to 20mg  daily.  Please avoid irritants like coffee, tomatoes or totatoe sauce, alcohol, and chocolate until his symptoms are better controlled.

## 2020-03-21 NOTE — Telephone Encounter (Signed)
I spoke with patient and gave him information from pharmacist.  He is going to take famotidine 10 mg twice daily until seeing GI.  He has many questions regarding his reflux and I asked him to follow up with PCP or GI with these questions.

## 2020-03-24 ENCOUNTER — Ambulatory Visit: Payer: PPO | Admitting: Family Medicine

## 2020-03-26 ENCOUNTER — Ambulatory Visit (INDEPENDENT_AMBULATORY_CARE_PROVIDER_SITE_OTHER): Payer: PPO | Admitting: Family Medicine

## 2020-03-26 ENCOUNTER — Encounter: Payer: Self-pay | Admitting: Family Medicine

## 2020-03-26 ENCOUNTER — Other Ambulatory Visit: Payer: Self-pay

## 2020-03-26 VITALS — BP 127/72 | HR 61 | Temp 97.7°F | Ht 73.0 in | Wt 196.4 lb

## 2020-03-26 DIAGNOSIS — R42 Dizziness and giddiness: Secondary | ICD-10-CM

## 2020-03-26 DIAGNOSIS — R310 Gross hematuria: Secondary | ICD-10-CM

## 2020-03-26 DIAGNOSIS — R319 Hematuria, unspecified: Secondary | ICD-10-CM | POA: Diagnosis not present

## 2020-03-26 LAB — MICROSCOPIC EXAMINATION
Bacteria, UA: NONE SEEN
Epithelial Cells (non renal): NONE SEEN /hpf (ref 0–10)

## 2020-03-26 LAB — URINALYSIS, COMPLETE
Bilirubin, UA: NEGATIVE
Glucose, UA: NEGATIVE
Leukocytes,UA: NEGATIVE
Nitrite, UA: NEGATIVE
Protein,UA: NEGATIVE
Specific Gravity, UA: 1.025 (ref 1.005–1.030)
Urobilinogen, Ur: 0.2 mg/dL (ref 0.2–1.0)
pH, UA: 6.5 (ref 5.0–7.5)

## 2020-03-26 NOTE — Progress Notes (Signed)
Assessment & Plan:  1. Gross hematuria - Advised patient to schedule f/u with his urologist ASAP as he does have trace blood in his urine. - Urinalysis, Complete - Microscopic Examination  2. Dizziness - Encouraged to keep a log of his BP before he takes his medication and after if he starts to feel dizzy.    Follow up plan: Return with PCP, for dizziness.  Hendricks Limes, MSN, APRN, FNP-C [facility]  Subjective:   Patient ID: Ronald Russell., male    DOB: 11/05/1934, 85 y.o.   MRN: [id number]  HPI: Ronald Russell. is a 85 y.o. male presenting on 03/26/2020 for Hematuria (Patient states sunday night when he urinated he passed a blood clot)  Patient reports passing a blood clot on Sunday night. He has not seen any blood since. He does have a h/o bladder cancer. He was last seen by his urologist (Dr. Alinda Money) for cystoscopy in with April or May per his report.   Patient also reports omeprazole and his atenolol make him feel dizzy. He quit taking omeprazole.    ROS: Negative unless specifically indicated above in HPI.   Relevant past medical history reviewed and updated as indicated.   Allergies and medications reviewed and updated.   Current Outpatient Medications:  .  acetaminophen (TYLENOL) 650 MG CR tablet, Take 650 mg by mouth every 6 (six) hours as needed for pain., Disp: , Rfl:  .  aspirin EC 81 MG tablet, Take 1 tablet (81 mg total) by mouth daily., Disp: 90 tablet, Rfl: 3 .  atenolol (TENORMIN) 25 MG tablet, Take 0.5 tablets (12.5 mg total) by mouth daily., Disp: 45 tablet, Rfl: 3 .  colestipol (COLESTID) 1 g tablet, Take 3 tablets (3 g total) by mouth 2 (two) times daily. (Patient taking differently: Take 1 g by mouth 2 (two) times daily. ), Disp: 180 tablet, Rfl: 2 .  famotidine (PEPCID) 20 MG tablet, Take 1 tablet (20 mg total) by mouth daily as needed for heartburn or indigestion. (Patient taking differently: Take 20 mg by mouth 2 (two)  times daily. ), Disp: 30 tablet, Rfl: 3 .  fluocinonide cream (LIDEX) 0.05 %, APPLY TO AFFECTED AREA TWICE A DAY, Disp: 30 g, Rfl: 5 .  fluticasone (FLONASE) 50 MCG/ACT nasal spray, Place 2 sprays into both nostrils daily., Disp: 16 g, Rfl: 6 .  Potassium Gluconate 550 MG TABS, Take 1 tablet (550 mg total) by mouth at bedtime. Need to take 1 tab one day and 2 the next day alternating, Disp: 30 tablet, Rfl: 0 .  sildenafil (REVATIO) 20 MG tablet, Take 1 tablet (20 mg total) by mouth daily as needed (2-5 as needed)., Disp: 150 tablet, Rfl: 1 .  Vitamin D, Cholecalciferol, 10 MCG (400 UNIT) CAPS, Take 400 Units by mouth daily., Disp: 100 capsule, Rfl: 12  Allergies  Allergen Reactions  . Uloric [Febuxostat] Palpitations, Other (See Comments) and Hypertension    Hypertension with palpitations and a sense of fuzziness and dizziness at the left side of his head  . Doxazosin Diarrhea  . Augmentin [Amoxicillin-Pot Clavulanate] Diarrhea  . Ciprofloxacin Diarrhea  . Codeine Nausea And Vomiting and Other (See Comments)    Severe headache  . Levaquin [Levofloxacin] Diarrhea  . Oxycodone Nausea And Vomiting  . Tramadol Nausea And Vomiting  . Vicodin [Hydrocodone-Acetaminophen] Nausea And Vomiting    Objective:   BP 127/72   Pulse 61   Temp 97.7 F (36.5 C) (Temporal)  Ht 6\' 1"  (1.854 m)   Wt 196 lb 6.4 oz (89.1 kg)   SpO2 96%   BMI 25.91 kg/m    Physical Exam Vitals reviewed.  Constitutional:      General: He is not in acute distress.    Appearance: Normal appearance. He is normal weight. He is not ill-appearing, toxic-appearing or diaphoretic.  HENT:     Head: Normocephalic and atraumatic.  Eyes:     General: No scleral icterus.       Right eye: No discharge.        Left eye: No discharge.     Conjunctiva/sclera: Conjunctivae normal.  Cardiovascular:     Rate and Rhythm: Normal rate.  Pulmonary:     Effort: Pulmonary effort is normal. No respiratory distress.  Musculoskeletal:         General: Normal range of motion.     Cervical back: Normal range of motion.  Skin:    General: Skin is warm and dry.  Neurological:     Mental Status: He is alert and oriented to person, place, and time. Mental status is at baseline.  Psychiatric:        Mood and Affect: Mood normal.        Behavior: Behavior normal.        Thought Content: Thought content normal.        Judgment: Judgment normal.

## 2020-03-28 DIAGNOSIS — R310 Gross hematuria: Secondary | ICD-10-CM | POA: Diagnosis not present

## 2020-03-28 DIAGNOSIS — Z8551 Personal history of malignant neoplasm of bladder: Secondary | ICD-10-CM | POA: Diagnosis not present

## 2020-03-31 DIAGNOSIS — K219 Gastro-esophageal reflux disease without esophagitis: Secondary | ICD-10-CM | POA: Diagnosis not present

## 2020-03-31 DIAGNOSIS — K22719 Barrett's esophagus with dysplasia, unspecified: Secondary | ICD-10-CM | POA: Diagnosis not present

## 2020-03-31 DIAGNOSIS — I2510 Atherosclerotic heart disease of native coronary artery without angina pectoris: Secondary | ICD-10-CM | POA: Diagnosis not present

## 2020-04-01 ENCOUNTER — Telehealth: Payer: Self-pay

## 2020-04-01 NOTE — Telephone Encounter (Signed)
   [facility] Pre-operative Risk Assessment    HEARTCARE STAFF: - Please ensure there is not already an duplicate clearance open for this procedure. - Under Visit Info/Reason for Call, type in Other and utilize the format Clearance MM/DD/YY or Clearance TBD. Do not use dashes or single digits. - If request is for dental extraction, please clarify the # of teeth to be extracted.  Request for surgical clearance:  1. What type of surgery is being performed? Endoscopy    2. When is this surgery scheduled? 05/22/20   3. What type of clearance is required (medical clearance vs. Pharmacy clearance to hold med vs. Both)? Medical   4. Are there any medications that need to be held prior to surgery and how long? None    5. Practice name and name of physician performing surgery? [facility], Dr. Arta Silence    6. What is the office phone number? [phone]   7.   What is the office fax number? [phone]  8.   Anesthesia type (None, local, MAC, general) ? None    Mendel Ryder 04/01/2020, 4:55 PM  _________________________________________________________________   (provider comments below)

## 2020-04-02 NOTE — Telephone Encounter (Signed)
   Primary Cardiologist: Dorris Carnes, MD  Chart reviewed as part of pre-operative protocol coverage. Patient was contacted 04/02/2020 in reference to pre-operative risk assessment for pending surgery as outlined below.  [name] was last seen on 02/01/20 by Dr. Harrington Challenger.  Since that day, BONIFACIO PRUDEN Sr. has done well.  Since his CABG in 2019, he can complete 4.0 METS without angina.   Therefore, based on ACC/AHA guidelines, the patient would be at acceptable risk for the planned procedure without further cardiovascular testing.   The patient was advised that if he develops new symptoms prior to surgery to contact our office to arrange for a follow-up visit, and he verbalized understanding.  I will route this recommendation to the requesting party via Epic fax function and remove from pre-op pool. Please call with questions.  Tami Lin Brentin Shin, PA 04/02/2020, 8:31 AM

## 2020-04-04 DIAGNOSIS — R310 Gross hematuria: Secondary | ICD-10-CM | POA: Diagnosis not present

## 2020-04-04 DIAGNOSIS — C678 Malignant neoplasm of overlapping sites of bladder: Secondary | ICD-10-CM | POA: Diagnosis not present

## 2020-04-04 DIAGNOSIS — Z8551 Personal history of malignant neoplasm of bladder: Secondary | ICD-10-CM | POA: Diagnosis not present

## 2020-04-04 DIAGNOSIS — D3500 Benign neoplasm of unspecified adrenal gland: Secondary | ICD-10-CM | POA: Diagnosis not present

## 2020-04-04 DIAGNOSIS — R3129 Other microscopic hematuria: Secondary | ICD-10-CM | POA: Diagnosis not present

## 2020-04-11 DIAGNOSIS — R310 Gross hematuria: Secondary | ICD-10-CM | POA: Diagnosis not present

## 2020-04-11 DIAGNOSIS — Z8551 Personal history of malignant neoplasm of bladder: Secondary | ICD-10-CM | POA: Diagnosis not present

## 2020-04-16 ENCOUNTER — Telehealth: Payer: Self-pay | Admitting: Internal Medicine

## 2020-04-16 ENCOUNTER — Other Ambulatory Visit: Payer: Self-pay | Admitting: Urology

## 2020-04-16 DIAGNOSIS — R197 Diarrhea, unspecified: Secondary | ICD-10-CM | POA: Diagnosis not present

## 2020-04-16 NOTE — Telephone Encounter (Signed)
° °  [facility] Pre-operative Risk Assessment    HEARTCARE STAFF: - Please ensure there is not already an duplicate clearance open for this procedure. - Under Visit Info/Reason for Call, type in Other and utilize the format Clearance MM/DD/YY or Clearance TBD. Do not use dashes or single digits. - If request is for dental extraction, please clarify the # of teeth to be extracted.  Request for surgical clearance:  1. What type of surgery is being performed? CYSTOSCOPY WITH BIOPSY BIOPSY/ FULGURATION, BILATERAL RETROGRADE  2. When is this surgery scheduled? 04/21/20  3. What type of clearance is required (medical clearance vs. Pharmacy clearance to hold med vs. Both)? Both   4. Are there any medications that need to be held prior to surgery and how long? Hold his Asprin 5 day prior    5. Practice name and name of physician performing surgery? [facility]   6. What is the office phone number? [place]   7.   What is the office fax number? [phone]  8.   Anesthesia type (None, local, MAC, general) ? General    Shana A Stovall 04/16/2020, 4:50 PM  _________________________________________________________________   (provider comments below)

## 2020-04-17 NOTE — Patient Instructions (Addendum)
DUE TO COVID-19 ONLY ONE VISITOR IS ALLOWED TO COME WITH YOU AND STAY IN THE WAITING ROOM ONLY DURING PRE OP AND PROCEDURE DAY OF SURGERY. THE 1 VISITOR  MAY VISIT WITH YOU AFTER SURGERY IN YOUR PRIVATE ROOM DURING VISITING HOURS ONLY!  YOU NEED TO HAVE A COVID 19 TEST ON: 04/18/20 @ 11:50 AM, THIS TEST MUST BE DONE BEFORE SURGERY,  COVID TESTING SITE [place] [address], IT IS ON THE RIGHT GOING OUT [address] APPROXIMATELY  2 MINUTES PAST [employer] ON THE RIGHT. ONCE YOUR COVID TEST IS COMPLETED,  PLEASE BEGIN THE QUARANTINE INSTRUCTIONS AS OUTLINED IN YOUR HANDOUT.                [place] procedure is scheduled on: 04/21/20   Report to [facility] Main  Entrance   Report to admitting at: 1:30 AM     Call this number if you have problems the morning of surgery [phone]    Remember: Do not eat solid food :After Midnight. Clear liquids from midnight until: 12:30 PM.  CLEAR LIQUID DIET   Foods Allowed                                                                     Foods Excluded  Coffee and tea, regular and decaf                             liquids that you cannot  Plain Jell-O any favor except red or purple                                           see through such as: Fruit ices (not with fruit pulp)                                     milk, soups, orange juice  Iced Popsicles                                    All solid food Carbonated beverages, regular and diet                                    Cranberry, grape and apple juices Sports drinks like Gatorade Lightly seasoned clear broth or consume(fat free) Sugar, honey syrup  Sample Menu Breakfast                                Lunch                                     Supper Cranberry juice  Beef broth                            Chicken broth Jell-O                                     Grape juice                           Apple  juice Coffee or tea                        Jell-O                                      Popsicle                                                Coffee or tea                        Coffee or tea  _____________________________________________________________________  BRUSH YOUR TEETH MORNING OF SURGERY AND RINSE YOUR MOUTH OUT, NO CHEWING GUM CANDY OR MINTS.     Take these medicines the morning of surgery with A SIP OF WATER: famotidine.                               You may not have any metal on your body including hair pins and              piercings  Do not wear jewelry, lotions, powders or perfumes, deodorant             Men may shave face and neck.   Do not bring valuables to the hospital. [place].  Contacts, dentures or bridgework may not be worn into surgery.  Leave suitcase in the car. After surgery it may be brought to your room.     Patients discharged the day of surgery will not be allowed to drive home. IF YOU ARE HAVING SURGERY AND GOING HOME THE SAME DAY, YOU MUST HAVE AN ADULT TO DRIVE YOU HOME AND BE WITH YOU FOR 24 HOURS. YOU MAY GO HOME BY TAXI OR UBER OR ORTHERWISE, BUT AN ADULT MUST ACCOMPANY YOU HOME AND STAY WITH YOU FOR 24 HOURS.  Name and phone number of your driver:  Special Instructions: N/A              Please read over the following fact sheets you were given: _____________________________________________________________________         [facility] - Preparing for Surgery Before surgery, you can play an important role.  Because skin is not sterile, your skin needs to be as free of germs as possible.  You can reduce the number of germs on your skin by washing with CHG (chlorahexidine gluconate) soap before surgery.  CHG is an antiseptic cleaner which kills germs and bonds with the skin to continue killing germs even  after washing. Please DO NOT use if you have an allergy to CHG or antibacterial soaps.  If your  skin becomes reddened/irritated stop using the CHG and inform your nurse when you arrive at Short Stay. Do not shave (including legs and underarms) for at least 48 hours prior to the first CHG shower.  You may shave your face/neck. Please follow these instructions carefully:  1.  Shower with CHG Soap the night before surgery and the  morning of Surgery.  2.  If you choose to wash your hair, wash your hair first as usual with your  normal  shampoo.  3.  After you shampoo, rinse your hair and body thoroughly to remove the  shampoo.                           4.  Use CHG as you would any other liquid soap.  You can apply chg directly  to the skin and wash                       Gently with a scrungie or clean washcloth.  5.  Apply the CHG Soap to your body ONLY FROM THE NECK DOWN.   Do not use on face/ open                           Wound or open sores. Avoid contact with eyes, ears mouth and genitals (private parts).                       Wash face,  Genitals (private parts) with your normal soap.             6.  Wash thoroughly, paying special attention to the area where your surgery  will be performed.  7.  Thoroughly rinse your body with warm water from the neck down.  8.  DO NOT shower/wash with your normal soap after using and rinsing off  the CHG Soap.                9.  Pat yourself dry with a clean towel.            10.  Wear clean pajamas.            11.  Place clean sheets on your bed the night of your first shower and do not  sleep with pets. Day of Surgery : Do not apply any lotions/deodorants the morning of surgery.  Please wear clean clothes to the hospital/surgery center.  FAILURE TO FOLLOW THESE INSTRUCTIONS MAY RESULT IN THE CANCELLATION OF YOUR SURGERY PATIENT SIGNATURE_________________________________  NURSE SIGNATURE__________________________________  ________________________________________________________________________

## 2020-04-17 NOTE — Telephone Encounter (Signed)
   Primary Cardiologist: Dorris Carnes, MD  Chart reviewed as part of pre-operative protocol coverage. Patient was contacted 04/17/2020 in reference to pre-operative risk assessment for pending surgery as outlined below.  Ronald Fear Sr. was last seen on 02/01/2020 by Dr. Harrington Challenger.  Since that day, Ronald FANT Sr. has done well without chest pain or shortness of breath.  Therefore, based on ACC/AHA guidelines, the patient would be at acceptable risk for the planned procedure without further cardiovascular testing.   The patient was advised that if he develops new symptoms prior to surgery to contact our office to arrange for a follow-up visit, and he verbalized understanding.  I will route this recommendation to the requesting party via Epic fax function and remove from pre-op pool. Please call with questions.  He has been holding aspirin since 11/3, he will hold aspirin for 5 days prior to the procedure and restart as soon as possible after the procedure.   Almyra Deforest, [place] 04/17/2020, 9:14 AM

## 2020-04-18 ENCOUNTER — Encounter (HOSPITAL_COMMUNITY)
Admission: RE | Admit: 2020-04-18 | Discharge: 2020-04-18 | Disposition: A | Discharge status: Home / Self Care | Payer: PPO | Source: Ambulatory Visit | Attending: Urology | Admitting: Urology

## 2020-04-18 ENCOUNTER — Other Ambulatory Visit: Payer: Self-pay

## 2020-04-18 ENCOUNTER — Encounter (HOSPITAL_COMMUNITY): Payer: Self-pay

## 2020-04-18 ENCOUNTER — Other Ambulatory Visit (HOSPITAL_COMMUNITY)
Admission: RE | Admit: 2020-04-18 | Discharge: 2020-04-18 | Disposition: A | Discharge status: Home / Self Care | Payer: PPO | Source: Ambulatory Visit | Attending: Urology | Admitting: Urology

## 2020-04-18 DIAGNOSIS — Z01812 Encounter for preprocedural laboratory examination: Secondary | ICD-10-CM | POA: Insufficient documentation

## 2020-04-18 DIAGNOSIS — Z20822 Contact with and (suspected) exposure to covid-19: Secondary | ICD-10-CM | POA: Insufficient documentation

## 2020-04-18 HISTORY — DX: Cardiac arrhythmia, unspecified: I49.9

## 2020-04-18 LAB — CBC
HCT: 44.1 % (ref 39.0–52.0)
Hemoglobin: 13.7 g/dL (ref 13.0–17.0)
MCH: 27.3 pg (ref 26.0–34.0)
MCHC: 31.1 g/dL (ref 30.0–36.0)
MCV: 88 fL (ref 80.0–100.0)
Platelets: 143 10*3/uL — ABNORMAL LOW (ref 150–400)
RBC: 5.01 MIL/uL (ref 4.22–5.81)
RDW: 16.4 % — ABNORMAL HIGH (ref 11.5–15.5)
WBC: 6.1 10*3/uL (ref 4.0–10.5)
nRBC: 0 % (ref 0.0–0.2)

## 2020-04-18 LAB — BASIC METABOLIC PANEL
Anion gap: 9 (ref 5–15)
BUN: 18 mg/dL (ref 8–23)
CO2: 26 mmol/L (ref 22–32)
Calcium: 8.8 mg/dL — ABNORMAL LOW (ref 8.9–10.3)
Chloride: 104 mmol/L (ref 98–111)
Creatinine, Ser: 1.43 mg/dL — ABNORMAL HIGH (ref 0.61–1.24)
GFR, Estimated: 49 mL/min — ABNORMAL LOW (ref >60)
Glucose, Bld: 99 mg/dL (ref 70–99)
Potassium: 4 mmol/L (ref 3.5–5.1)
Sodium: 139 mmol/L (ref 135–145)

## 2020-04-18 LAB — SARS CORONAVIRUS 2 (TAT 6-24 HRS): SARS Coronavirus 2: NEGATIVE

## 2020-04-18 NOTE — H&P (Signed)
Office Visit Report     04/11/2020   --------------------------------------------------------------------------------   Ronald Russell  MRN: [id number]  DOB: 10-Nov-1934, 85 year old Male  SSN: [id number]   PRIMARY CARE:  Floyde Parkins, MD  REFERRING:  Jama Flavors. Redmond Pulling, MD  PROVIDER:  Raynelle Bring, M.D.  LOCATION:  [facility] [phone]     --------------------------------------------------------------------------------   CC/HPI: 1. History of urothelial carcinoma the bladder  2. Gross hematuria   Ronald Russell returns today after last having been seen approximately 6 months ago for routine surveillance. At that time, he had no evidence for recurrence. He developed 1 episode of gross hematuria when he passed a small clot. He presented for further evaluation and was evaluated for possible urinary tract infection. However, his culture came back negative. He returns today to complete a hematuria evaluation. He has undergone upper tract imaging with a recent CT scan. He has not been having recurrent gross hematuria since his last visit.     ALLERGIES: Cipro TABS Codeine Derivatives    MEDICATIONS: Aspirin 81 MG TABS Oral  Atenolol 25 mg tablet 1/2 tablet PO Daily  Potassium  Probiotic  Vitamin D3     GU PSH: Cysto Bladder Ureth Biopsy - 2010 Cystoscopy - 10/26/2019, 10/20/2018, 2019, 2018, 2017 Cystoscopy TURBT <2 cm - 2010 Cystoscopy TURBT 2-5 cm - 2010 Locm 300-399Mg /Ml Iodine,1Ml - 04/04/2020       PSH Notes: Cystoscopy With Biopsy, Cystoscopy With Fulguration Small Lesion (5-19mm), Cystoscopy With Fulguration Medium Lesion (2-5cm), Mastoidectomy, Split-Thickness Autograft Legs   NON-GU PSH: CABG (coronary artery bypass grafting)     GU PMH: Bladder Cancer overlapping sites - 04/04/2020, Malignant neoplasm of overlapping sites of bladder, - 2016 Gross hematuria - 03/28/2020 History of bladder cancer - 03/28/2020, - 2018 CIS of the bladder,  Transitional cell carcinoma in situ of urinary bladder - 2016 Bladder Cancer, Unspec, Transitional cell carcinoma of bladder - 2015 Dysuria, Dysuria - 2015 ED due to arterial insufficiency, Erectile dysfunction due to arterial insufficiency - 2015 Encounter for Prostate Cancer screening, Prostate cancer screening - 2014 Urinary Tract Inf, Unspec site, Urinary tract infection - 2014      PMH Notes:   1) Urothelial carcinoma of the bladder: He presented with gross hematuria in April 2010 and was found to have multiple bladder tumors. His CT scan at that time did not demonstrate evidence for upper tract disease and his cytology was suspicious. His treatment is outlined below.   Apr 2010: Initial TURBT - High grade papillary T1 urothelial carcinoma with concomitant CIS  May 2010: Restaging TUR - CIS without deeper invasion (this was at the bladder neck and deep resection into muscle and prostate was performed)  July-Aug 2010: 6 week induction course of BCG  Sep 2010: Biopsy - inflammation, no malignancy  Nov 2010: Maintenance BCG (poorly tolerated - stopped after 2nd dose due to LUTS and fever)  Apr 2011: Maintenance BCG (poorly tolerated - stopped after 2nd dose to LUTS and fever) -- stopped BCG therapy at this point   2) Erectile dysfunction:   Current treatment: Levitra 20 mg  Prior treatment: Cialis (less effective than Levitra), Viagra (discontinued due to side effects), Levitra (discontinued due to side effects)     NON-GU PMH: Anxiety, Anxiety - 2014 Coronary Artery Disease GERD    FAMILY HISTORY: Breast Cancer - Mother, Sister Colon Cancer - Sister, Brother nephrolithiasis - Sister   SOCIAL HISTORY: Marital Status: Married Preferred Language: English; Ethnicity:  Not Hispanic Or Latino; Race: White     Notes: Never A Smoker, Tobacco Use, Alcohol Use, Marital History - Divorced, Occupation:   REVIEW OF SYSTEMS:    GU Review Male:   Patient denies frequent urination, hard to  postpone urination, burning/ pain with urination, get up at night to urinate, leakage of urine, stream starts and stops, trouble starting your streams, and have to strain to urinate .  Gastrointestinal (Upper):   Patient denies nausea and vomiting.  Gastrointestinal (Lower):   Patient denies diarrhea and constipation.  Constitutional:   Patient denies fever, night sweats, weight loss, and fatigue.  Skin:   Patient denies skin rash/ lesion and itching.  Eyes:   Patient denies blurred vision and double vision.  Ears/ Nose/ Throat:   Patient denies sore throat and sinus problems.  Hematologic/Lymphatic:   Patient denies swollen glands and easy bruising.  Cardiovascular:   Patient denies leg swelling and chest pains.  Respiratory:   Patient denies cough and shortness of breath.  Endocrine:   Patient denies excessive thirst.  Musculoskeletal:   Patient denies back pain and joint pain.  Neurological:   Patient denies headaches and dizziness.  Psychologic:   Patient denies depression and anxiety.   VITAL SIGNS:      04/11/2020 01:15 PM  Weight 185 lb / 83.91 kg  Height 71 in / 180.34 cm  BP 151/74 mmHg  Pulse 57 /min  Temperature 97.1 F / 36.1 C  BMI 25.8 kg/m   GU PHYSICAL EXAMINATION:    Urethral Meatus: Normal size. No lesion, no wart, no discharge, no polyp. Normal location.   MULTI-SYSTEM PHYSICAL EXAMINATION:    Constitutional: Well-nourished. No physical deformities. Normally developed. Good grooming.  Respiratory: No labored breathing, no use of accessory muscles. Clear bilaterally.  Cardiovascular: Normal temperature, normal extremity pulses, no swelling, no varicosities. Regular rate and rhythm.     Complexity of Data:  X-Ray Review: C.T. Hematuria: Reviewed Films.     12/29/11 08/19/10 08/11/09  PSA  Total PSA 0.32  0.47  0.78    Notes:                     CT ABDOMEN AND PELVIS WITHOUT AND WITH CONTRAST 04/04/2020    --------------------------------------------------------------------------------   Ronald Russell  MRN: [id number] Ordering Provider: Jiles Crocker  DOB: 03-24-1935 Date Collected: 04/04/2020 13:15:00  SSN: [id number] Date Completed: 04/04/2020 21:56:02   --------------------------------------------------------------------------------   CLINICAL DATA: Gross hematuria, history of bladder cancer status  post TURBT in 2010 with follow on BCG treatments. Gross hematuria  with microscopic hematuria 2 weeks ago   EXAM:  CT ABDOMEN AND PELVIS WITHOUT AND WITH CONTRAST   TECHNIQUE:  Multidetector CT imaging of the abdomen and pelvis was performed  following the standard protocol before and following the bolus  administration of intravenous contrast.   CONTRAST: 125 cc Omnipaque 300   COMPARISON: Multiple prior studies most recent comparison from December 09, 2016, more remote evaluations dating back to 2013   FINDINGS:  Lower chest: Post median sternotomy. Moderately large hiatal hernia  extends into the chest with similar appearance to prior imaging   Hepatobiliary: Normal appearance of the liver. Small low-density  lesion in the cephalad medial LEFT hepatic lobe is compatible with a  cyst. Portal vein is patent. Post cholecystectomy without biliary  duct dilation.   Pancreas: Pancreas is normal.   Spleen: Spleen is normal.   Adrenals/Urinary Tract: Adrenal glands with stable thickening of the  LEFT adrenal dating back to 2013 showing low-density compatible with  LEFT adrenal adenoma 1.6 cm. Kidneys with symmetric enhancement. The  no abnormal enhancement along the course of LEFT or RIGHT ureter. No  nephrolithiasis. LEFT renal cyst along the interpolar LEFT kidney  measures 1.8 cm.   Stomach/Bowel: Moderately large hiatal hernia. No perigastric  stranding. Signs of mild gastric mucosal thickening near the gastric  antrum. Mild mural stratification also suggested in this area. Small   bowel is normal caliber.   Appendix is normal. Stool fills much of the colon. Colonic  diverticulosis. No sign of acute diverticular changes.   Vascular/Lymphatic: Calcified and noncalcified atheromatous plaque  in the abdominal aorta without aneurysmal dilation. There is no  gastrohepatic or hepatoduodenal ligament lymphadenopathy. No  retroperitoneal or mesenteric lymphadenopathy.   No pelvic sidewall lymphadenopathy.   Reproductive: Prostate with heterogeneity similar to prior imaging.   Other: No free air or ascites.   Musculoskeletal: Post median sternotomy. No acute or destructive  bone process.   Excretory phase: Suggestion of bladder trabeculation. Urinary  bladder with limited assessment due to under distension and  incomplete opacification. No upper tract lesion.   IMPRESSION:  1. No evidence of recurrent or metastatic disease in the abdomen or  pelvis. No sign of upper tract lesion. No adenopathy.  2. Limited assessment of the urinary bladder due to under distension  and partial filling.  3. Mild gastric antral thickening and mural stratification,  correlate with any evidence of gastritis and with endoscopic  assessment as warranted.  4. Stable LEFT adrenal adenoma.  5. Colonic diverticulosis without sign of acute diverticular  changes.  6. Aortic atherosclerosis.   Aortic Atherosclerosis (ICD10-I70.0).    Electronically Signed  By: Zetta Bills M.D.  On: 04/04/2020 21:53     PROCEDURES:         Flexible Cystoscopy - 52000  Indication: Gross hematuria Risks, benefits, and potential complications of the procedure were discussed with the patient including infection, bleeding, voiding discomfort, urinary retention, fever, chills, sepsis, and others. All questions were answered. Informed consent was obtained. Sterile technique and intraurethral analgesia were used.  Meatus:  Normal size. Normal location. Normal condition.  Urethra:  No strictures.  External  Sphincter:  Normal.  Verumontanum:  Normal.  Prostate:  Non-obstructing. No hyperplasia.  Bladder Neck:  Non-obstructing.  Ureteral Orifices:  Normal location. Normal size. Normal shape. Effluxed clear urine.  Bladder:  Systematic examination of bladder does reveal a slightly raised erythematous area posteriorly measuring approximately 1-1.5 cm. There is an additional area just anterior to the bladder neck on retroflexion of the scope that also appears slightly suspicious for possible recurrent urothelial carcinoma. A bladder washing has been obtained for cytology.      Chaperone: [place] The procedure was well-tolerated and without complications. Instructions were given to call the office immediately if questions or problems.         Urinalysis w/Scope - 81001 Dipstick Dipstick Cont'd Micro  Color: Yellow Bilirubin: Neg WBC/hpf: 0 - 5/hpf  Appearance: Clear Ketones: Neg RBC/hpf: 3 - 10/hpf  Specific Gravity: 1.025 Blood: 3+ Bacteria: Rare (0-9/hpf)  pH: <=5.0 Protein: Neg Cystals: NS (Not Seen)  Glucose: Neg Urobilinogen: 0.2 Casts: Granular    Nitrites: Neg Trichomonas: Not Present    Leukocyte Esterase: Neg Mucous: Present      Epithelial Cells: NS (Not Seen)      Yeast: NS (Not Seen)      Sperm: Not Present    Notes:  finely  ASSESSMENT:      ICD-10 Details  1 GU:   History of bladder cancer - Z85.51   2   Gross hematuria - R31.0    PLAN:           Orders Labs Urine Cytology          Schedule Return Visit/Planned Activity: Other See Visit Notes             Note: Will call to schedule surgery.          Document Letter(s):  Created for Patient: Clinical Summary         Notes:   1. Urothelial carcinoma the bladder/hematuria: We discussed his cystoscopic results today which to raise possible concern for recurrent urothelial carcinoma. He has no evidence of upper tract abnormalities on his CT scan. I recommended that he proceed with cystoscopy, bilateral  retrograde pyelography, and bladder biopsy for further evaluation in the near future. Bladder washing has been obtained for cytology today.   Cc: Dr. Lorene Dy    * Signed by Raynelle Bring, M.D. on 04/14/20 at 2:38 PM (EDT)*

## 2020-04-18 NOTE — Progress Notes (Addendum)
COVID Vaccine Completed: Yes Date COVID Vaccine completed: 10/10/19 COVID vaccine manufacturer:    Moderna     PCP - Dr. Claretta Fraise. Cardiologist - Dr. Dorris Carnes. : Clearance: 04/21/20. :EPIC  Chest x-ray -  EKG - 02/01/20 EPIC Stress Test -  ECHO - 01/08/20 EPIC Cardiac Cath -  Pacemaker/ICD device last checked:  Sleep Study -  CPAP -   Fasting Blood Sugar -  Checks Blood Sugar _____ times a day  Blood Thinner Instructions: Aspirin Instructions: Last Dose:  Anesthesia review: Hx: CAD,Dysrhythmia,HTN  Patient denies shortness of breath, fever, cough and chest pain at PAT appointment   Patient verbalized understanding of instructions that were given to them at the PAT appointment. Patient was also instructed that they will need to review over the PAT instructions again at home before surgery.
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
## 2020-04-21 ENCOUNTER — Ambulatory Visit (HOSPITAL_COMMUNITY): Payer: PPO | Admitting: Physician Assistant

## 2020-04-21 ENCOUNTER — Encounter (HOSPITAL_COMMUNITY)
Admission: RE | Disposition: A | Discharge status: Home / Self Care | Payer: Self-pay | Source: Home / Self Care | Attending: Urology

## 2020-04-21 ENCOUNTER — Ambulatory Visit (HOSPITAL_COMMUNITY): Payer: PPO | Admitting: Certified Registered Nurse Anesthetist

## 2020-04-21 ENCOUNTER — Other Ambulatory Visit: Payer: Self-pay

## 2020-04-21 ENCOUNTER — Encounter (HOSPITAL_COMMUNITY): Payer: Self-pay | Admitting: Urology

## 2020-04-21 ENCOUNTER — Ambulatory Visit (HOSPITAL_COMMUNITY)
Admission: RE | Admit: 2020-04-21 | Discharge: 2020-04-21 | Disposition: A | Discharge status: Home / Self Care | Payer: PPO | Attending: Urology | Admitting: Urology

## 2020-04-21 DIAGNOSIS — Z7982 Long term (current) use of aspirin: Secondary | ICD-10-CM | POA: Diagnosis not present

## 2020-04-21 DIAGNOSIS — Z951 Presence of aortocoronary bypass graft: Secondary | ICD-10-CM | POA: Insufficient documentation

## 2020-04-21 DIAGNOSIS — E785 Hyperlipidemia, unspecified: Secondary | ICD-10-CM | POA: Diagnosis not present

## 2020-04-21 DIAGNOSIS — C674 Malignant neoplasm of posterior wall of bladder: Secondary | ICD-10-CM | POA: Diagnosis not present

## 2020-04-21 DIAGNOSIS — R310 Gross hematuria: Secondary | ICD-10-CM | POA: Diagnosis not present

## 2020-04-21 DIAGNOSIS — Z87891 Personal history of nicotine dependence: Secondary | ICD-10-CM | POA: Insufficient documentation

## 2020-04-21 DIAGNOSIS — E559 Vitamin D deficiency, unspecified: Secondary | ICD-10-CM | POA: Diagnosis not present

## 2020-04-21 DIAGNOSIS — Z79899 Other long term (current) drug therapy: Secondary | ICD-10-CM | POA: Diagnosis not present

## 2020-04-21 DIAGNOSIS — F418 Other specified anxiety disorders: Secondary | ICD-10-CM | POA: Diagnosis not present

## 2020-04-21 DIAGNOSIS — D494 Neoplasm of unspecified behavior of bladder: Secondary | ICD-10-CM | POA: Diagnosis not present

## 2020-04-21 DIAGNOSIS — N3289 Other specified disorders of bladder: Secondary | ICD-10-CM | POA: Diagnosis present

## 2020-04-21 DIAGNOSIS — Z8551 Personal history of malignant neoplasm of bladder: Secondary | ICD-10-CM | POA: Diagnosis not present

## 2020-04-21 HISTORY — PX: CYSTOSCOPY WITH BIOPSY: SHX5122

## 2020-04-21 SURGERY — CYSTOSCOPY, WITH BIOPSY
Anesthesia: General

## 2020-04-21 MED ORDER — FENTANYL CITRATE (PF) 100 MCG/2ML IJ SOLN
INTRAMUSCULAR | Status: AC
  Filled 2020-04-21: qty 2

## 2020-04-21 MED ORDER — CEFAZOLIN SODIUM-DEXTROSE 2-4 GM/100ML-% IV SOLN
2.0000 g | Freq: Once | INTRAVENOUS | Status: AC
  Administered 2020-04-21: 2 g via INTRAVENOUS
  Filled 2020-04-21: qty 100

## 2020-04-21 MED ORDER — FENTANYL CITRATE (PF) 100 MCG/2ML IJ SOLN: 25.0000 ug | INTRAMUSCULAR | Status: DC | PRN

## 2020-04-21 MED ORDER — ORAL CARE MOUTH RINSE: 15.0000 mL | Freq: Once | OROMUCOSAL | Status: AC

## 2020-04-21 MED ORDER — STERILE WATER FOR IRRIGATION IR SOLN
Status: DC | PRN
  Administered 2020-04-21: 3000 mL

## 2020-04-21 MED ORDER — PROPOFOL 500 MG/50ML IV EMUL
INTRAVENOUS | Status: DC | PRN
  Administered 2020-04-21: 150 mg via INTRAVENOUS

## 2020-04-21 MED ORDER — PHENAZOPYRIDINE HCL 200 MG PO TABS: 200.0000 mg | ORAL_TABLET | Freq: Three times a day (TID) | ORAL | 0 refills | Status: DC | PRN

## 2020-04-21 MED ORDER — ONDANSETRON HCL 4 MG/2ML IJ SOLN
INTRAMUSCULAR | Status: AC
  Filled 2020-04-21: qty 2

## 2020-04-21 MED ORDER — FENTANYL CITRATE (PF) 100 MCG/2ML IJ SOLN
INTRAMUSCULAR | Status: DC | PRN
  Administered 2020-04-21: 50 ug via INTRAVENOUS

## 2020-04-21 MED ORDER — CHLORHEXIDINE GLUCONATE 0.12 % MT SOLN
15.0000 mL | Freq: Once | OROMUCOSAL | Status: AC
  Administered 2020-04-21: 15 mL via OROMUCOSAL

## 2020-04-21 MED ORDER — MEPERIDINE HCL 50 MG/ML IJ SOLN: 6.2500 mg | INTRAMUSCULAR | Status: DC | PRN

## 2020-04-21 MED ORDER — EPHEDRINE SULFATE-NACL 50-0.9 MG/10ML-% IV SOSY
PREFILLED_SYRINGE | INTRAVENOUS | Status: DC | PRN
  Administered 2020-04-21: 15 mg via INTRAVENOUS

## 2020-04-21 MED ORDER — LACTATED RINGERS IV SOLN: INTRAVENOUS | Status: DC

## 2020-04-21 MED ORDER — METRONIDAZOLE 500 MG PO TABS: 500.0000 mg | ORAL_TABLET | Freq: Two times a day (BID) | ORAL | 0 refills | Status: AC

## 2020-04-21 MED ORDER — LIDOCAINE 2% (20 MG/ML) 5 ML SYRINGE
INTRAMUSCULAR | Status: AC
  Filled 2020-04-21: qty 5

## 2020-04-21 MED ORDER — SODIUM CHLORIDE 0.9 % IR SOLN
Status: DC | PRN
  Administered 2020-04-21: 1000 mL

## 2020-04-21 MED ORDER — ONDANSETRON HCL 4 MG/2ML IJ SOLN
INTRAMUSCULAR | Status: DC | PRN
  Administered 2020-04-21: 4 mg via INTRAVENOUS

## 2020-04-21 MED ORDER — PROMETHAZINE HCL 25 MG/ML IJ SOLN: 6.2500 mg | INTRAMUSCULAR | Status: DC | PRN

## 2020-04-21 MED ORDER — DEXAMETHASONE SODIUM PHOSPHATE 10 MG/ML IJ SOLN
INTRAMUSCULAR | Status: AC
  Filled 2020-04-21: qty 1

## 2020-04-21 MED ORDER — LIDOCAINE 2% (20 MG/ML) 5 ML SYRINGE
INTRAMUSCULAR | Status: DC | PRN
  Administered 2020-04-21: 60 mg via INTRAVENOUS

## 2020-04-21 MED ORDER — PROPOFOL 10 MG/ML IV BOLUS
INTRAVENOUS | Status: AC
  Filled 2020-04-21: qty 20

## 2020-04-21 MED ORDER — DEXAMETHASONE SODIUM PHOSPHATE 10 MG/ML IJ SOLN
INTRAMUSCULAR | Status: DC | PRN
  Administered 2020-04-21: 10 mg via INTRAVENOUS

## 2020-04-21 SURGICAL SUPPLY — 15 items
BAG DRN RND TRDRP ANRFLXCHMBR (UROLOGICAL SUPPLIES)
BAG URINE DRAIN 2000ML AR STRL (UROLOGICAL SUPPLIES) IMPLANT
BAG URO CATCHER STRL LF (MISCELLANEOUS) ×2 IMPLANT
ELECT REM PT RETURN 15FT ADLT (MISCELLANEOUS) ×2 IMPLANT
GLOVE BIOGEL M STRL SZ7.5 (GLOVE) ×2 IMPLANT
GOWN STRL REUS W/TWL LRG LVL3 (GOWN DISPOSABLE) ×2 IMPLANT
KIT TURNOVER KIT A (KITS) IMPLANT
LOOP CUT BIPOLAR 24F LRG (ELECTROSURGICAL) IMPLANT
MANIFOLD NEPTUNE II (INSTRUMENTS) ×2 IMPLANT
PACK CYSTO (CUSTOM PROCEDURE TRAY) ×2 IMPLANT
PENCIL SMOKE EVACUATOR (MISCELLANEOUS) IMPLANT
SYR TOOMEY IRRIG 70ML (MISCELLANEOUS)
SYRINGE TOOMEY IRRIG 70ML (MISCELLANEOUS) IMPLANT
TUBING CONNECTING 10 (TUBING) ×2 IMPLANT
TUBING UROLOGY SET (TUBING) ×1 IMPLANT

## 2020-04-21 NOTE — Interval H&P Note (Signed)
History and Physical Interval Note:  04/21/2020 2:08 PM  Ronald Fear Sr.  has presented today for surgery, with the diagnosis of BLADDER TUMOR.  The various methods of treatment have been discussed with the patient and family. After consideration of risks, benefits and other options for treatment, the patient has consented to  Procedure(s) with comments: CYSTOSCOPY WITH BIOPSY BIOPSY/ FULGURATION, BILATERAL RETROGRADE (N/A) - ONLY NEEDS 45 MIN as a surgical intervention.  The patient's history has been reviewed, patient examined, no change in status, stable for surgery.  I have reviewed the patient's chart and labs.  Questions were answered to the patient's satisfaction.     [employer]

## 2020-04-21 NOTE — Anesthesia Procedure Notes (Signed)
Procedure Name: LMA Insertion Date/Time: 04/21/2020 2:58 PM Performed by: Gerald Leitz, CRNA Pre-anesthesia Checklist: Patient identified, Patient being monitored, Timeout performed, Emergency Drugs available and Suction available Patient Re-evaluated:Patient Re-evaluated prior to induction Oxygen Delivery Method: Circle system utilized Preoxygenation: Pre-oxygenation with 100% oxygen Induction Type: IV induction Ventilation: Mask ventilation without difficulty LMA: LMA inserted LMA Size: 4.0 Tube type: Oral Number of attempts: 1 Placement Confirmation: positive ETCO2 and breath sounds checked- equal and bilateral Tube secured with: Tape Dental Injury: Teeth and Oropharynx as per pre-operative assessment

## 2020-04-21 NOTE — Anesthesia Postprocedure Evaluation (Signed)
Anesthesia Post Note  Patient: Ronald RANDAZZO Sr.  Procedure(s) Performed: CYSTOSCOPY WITH BLADDER BIOPSY/ FULGURATION (N/A )     Patient location during evaluation: Phase II Anesthesia Type: General Level of consciousness: awake Pain management: pain level controlled Vital Signs Assessment: post-procedure vital signs reviewed and stable Respiratory status: spontaneous breathing Cardiovascular status: stable Postop Assessment: no apparent nausea or vomiting Anesthetic complications: no   No complications documented.  Last Vitals:  Vitals:   04/21/20 1545 04/21/20 1600  BP: 130/61 132/74  Pulse: 74 72  Resp: 17 (!) 21  Temp:  36.4 C  SpO2: 96% 95%    Last Pain:  Vitals:   04/21/20 1600  TempSrc:   PainSc: 0-No pain                 John F Salome Arnt

## 2020-04-21 NOTE — Discharge Instructions (Signed)
1. You may see some blood in the urine and may have some burning with urination for 48-72 hours. You also may notice that you have to urinate more frequently or urgently after your procedure which is normal.  2. You should call should you develop an inability urinate, fever > 101, persistent nausea and vomiting that prevents you from eating or drinking to stay hydrated.

## 2020-04-21 NOTE — Anesthesia Preprocedure Evaluation (Addendum)
Anesthesia Evaluation  Patient identified by MRN, date of birth, ID band Patient awake    Reviewed: Allergy & Precautions, NPO status , Patient's Chart, lab work & pertinent test results, reviewed documented beta blocker date and time   Airway Mallampati: I       Dental no notable dental hx.    Pulmonary former smoker,    Pulmonary exam normal        Cardiovascular hypertension, Pt. on home beta blockers Normal cardiovascular exam     Neuro/Psych  Headaches, PSYCHIATRIC DISORDERS Anxiety Depression    GI/Hepatic GERD  Medicated and Controlled,  Endo/Other    Renal/GU Renal InsufficiencyRenal disease     Musculoskeletal negative musculoskeletal ROS (+)   Abdominal Normal abdominal exam  (+)   Peds  Hematology negative hematology ROS (+)   Anesthesia Other Findings    Monitor shows isolated skips from the upper chambers (atria) No sustained arrhythmia Would try adding low dose diltiazem to see if helps  30 mg   2 times per day  Continue other meds 28/2020 10:16 PM EDT   Echo shows normal pumping functoin of left and right ventricles No significant valve abnormalities       Reproductive/Obstetrics                            Anesthesia Physical Anesthesia Plan  ASA: III  Anesthesia Plan: General   Post-op Pain Management:    Induction: Intravenous  PONV Risk Score and Plan: 3 and Ondansetron and Dexamethasone  Airway Management Planned: LMA  Additional Equipment: None  Intra-op Plan:   Post-operative Plan: Extubation in OR  Informed Consent: I have reviewed the patients History and Physical, chart, labs and discussed the procedure including the risks, benefits and alternatives for the proposed anesthesia with the patient or authorized representative who has indicated his/her understanding and acceptance.     Dental advisory given  Plan Discussed with:  CRNA  Anesthesia Plan Comments:         Anesthesia Quick Evaluation

## 2020-04-21 NOTE — Op Note (Signed)
Preoperative diagnosis: 1.  History of urothelial carcinoma of the bladder 2.  Posterior bladder lesion  Postoperative diagnosis: 1.  History of urothelial carcinoma of the bladder 2.  Posterior bladder lesion  Procedure: 1.  Cystoscopy 2.  Biopsy of bladder lesion with fulguration  Surgeon: Pryor Curia MD  Anesthesia: General  Complications: None  EBL: Minimal  Specimens: Posterior bladder lesion  Disposition of specimen: Pathology  Intraoperative findings: There was noted to be a small 1 to 1.5 cm erythematous but nonspecific lesion of the posterior bladder.  Considering his history of carcinoma in situ, it was recommended that he undergo bladder biopsy for further evaluation.  Indication: This is an 85 year old gentleman with a history of urothelial carcinoma the bladder.  He presents today for biopsy of a bladder lesion noted on surveillance cystoscopy.  Upper tract imaging was unremarkable.  The potential risks, complications, and expected recovery process associated with the above procedures were discussed in detail.  Informed consent was obtained.  Description of procedure: The patient was taken the operating room and a general anesthetic was administered.  He was given preoperative antibiotics, placed in the dorsolithotomy position, and prepped and draped in usual sterile fashion.  Next, a preoperative timeout was performed.  Cystourethroscopy was performed with both a 30 degree and 70 degree lens.  Systematic examination of the bladder did reveal a small posterior erythematous bladder lesion as previously identified and described above.  No other bladder tumors, stones, or other mucosal pathology was identified.  Even with a 70 degree lens, I was unable to identify the abnormal area seen on retroflexion of the flexible scope in the office.  Using a cold cup biopsy forceps, the posterior bladder lesion was biopsied.  A total of 2 biopsies were obtained.  A Bugbee  electrode was then used to cauterize this area to achieve hemostasis.  The bladder was emptied and reinspected.  There was excellent hemostasis.  The bladder was emptied and the procedure was ended.  He tolerated the procedure well without complications.  He was able to be awakened and transferred to recovery unit in satisfactory condition.

## 2020-04-21 NOTE — Transfer of Care (Signed)
Immediate Anesthesia Transfer of Care Note  Patient: Ronald Fear Sr.  Procedure(s) Performed: Procedure(s) with comments: CYSTOSCOPY WITH BLADDER BIOPSY/ FULGURATION (N/A) - ONLY NEEDS 45 MIN  Patient Location: PACU  Anesthesia Type:General  Level of Consciousness: Alert, Awake, Oriented  Airway & Oxygen Therapy: Patient Spontanous Breathing  Post-op Assessment: Report given to RN  Post vital signs: Reviewed and stable  Last Vitals:  Vitals:   04/21/20 1239  BP: (!) 150/88  Pulse: 66  Resp: 18  Temp: 36.7 C  SpO2: 604%    Complications: No apparent anesthesia complications

## 2020-04-22 ENCOUNTER — Encounter (HOSPITAL_COMMUNITY): Payer: Self-pay | Admitting: Urology

## 2020-04-22 LAB — SURGICAL PATHOLOGY

## 2020-05-03 DIAGNOSIS — H40033 Anatomical narrow angle, bilateral: Secondary | ICD-10-CM | POA: Diagnosis not present

## 2020-05-03 DIAGNOSIS — H2513 Age-related nuclear cataract, bilateral: Secondary | ICD-10-CM | POA: Diagnosis not present

## 2020-05-06 DIAGNOSIS — R8271 Bacteriuria: Secondary | ICD-10-CM | POA: Diagnosis not present

## 2020-05-06 DIAGNOSIS — C678 Malignant neoplasm of overlapping sites of bladder: Secondary | ICD-10-CM | POA: Diagnosis not present

## 2020-05-12 ENCOUNTER — Telehealth: Payer: Self-pay | Admitting: Internal Medicine

## 2020-05-12 NOTE — Telephone Encounter (Signed)
Patient just had cancer surgery and his oncologist told him to reach out to her cardiologist on when it would be best for him to get his covid booster shot and flu shot.

## 2020-05-13 NOTE — Telephone Encounter (Signed)
Left detailed message that Dr. Harrington Challenger recommends patient get both flu vaccine and covid booster vaccine.

## 2020-05-13 NOTE — Telephone Encounter (Signed)
I would recomm that he get both the flu and the booster vaccines

## 2020-05-19 DIAGNOSIS — Z1159 Encounter for screening for other viral diseases: Secondary | ICD-10-CM | POA: Diagnosis not present

## 2020-05-22 DIAGNOSIS — K2270 Barrett's esophagus without dysplasia: Secondary | ICD-10-CM | POA: Diagnosis not present

## 2020-05-22 DIAGNOSIS — K2930 Chronic superficial gastritis without bleeding: Secondary | ICD-10-CM | POA: Diagnosis not present

## 2020-05-22 DIAGNOSIS — K317 Polyp of stomach and duodenum: Secondary | ICD-10-CM | POA: Diagnosis not present

## 2020-05-22 DIAGNOSIS — K449 Diaphragmatic hernia without obstruction or gangrene: Secondary | ICD-10-CM | POA: Diagnosis not present

## 2020-05-22 DIAGNOSIS — K219 Gastro-esophageal reflux disease without esophagitis: Secondary | ICD-10-CM | POA: Diagnosis not present

## 2020-05-28 ENCOUNTER — Other Ambulatory Visit: Payer: Self-pay | Admitting: Family Medicine

## 2020-05-28 DIAGNOSIS — K2270 Barrett's esophagus without dysplasia: Secondary | ICD-10-CM | POA: Diagnosis not present

## 2020-05-28 DIAGNOSIS — K2930 Chronic superficial gastritis without bleeding: Secondary | ICD-10-CM | POA: Diagnosis not present

## 2020-05-28 DIAGNOSIS — K219 Gastro-esophageal reflux disease without esophagitis: Secondary | ICD-10-CM

## 2020-06-12 ENCOUNTER — Telehealth: Payer: Self-pay | Admitting: Internal Medicine

## 2020-06-12 NOTE — Telephone Encounter (Signed)
Patient states he was exposed to [place] on 06/08/20, but he has had the irregular HR since 06/03/20, when he received his COVID booster shot. He also states he doubled his Atenolol intake from 12.5 MG to 20 MG daily, but this has not relieved symptoms.  Patient c/o Palpitations:  High priority if patient c/o lightheadedness, shortness of breath, or chest pain  1) How long have you had palpitations/irregular HR/ Afib? Are you having the symptoms now? Irregular HR (patient states HR has been skipping a beat) - no symptoms currently  2) Are you currently experiencing lightheadedness, SOB or CP? No   3) Do you have a history of afib (atrial fibrillation) or irregular heart rhythm? No   4) Have you checked your BP or HR? (document readings if available):  No readings available  5) Are you experiencing any other symptoms? No

## 2020-06-12 NOTE — Telephone Encounter (Signed)
I spoke with pt     Reviewed with pt   I would also recomm holding back on Shingles vaccine   Still having some skips   If continues call back in a couple weeks and would sched a monitor.

## 2020-06-12 NOTE — Telephone Encounter (Signed)
Returned call to patient who states he has been having palpitations since getting the Covid booster. He states he had mild redness, fever, and swelling at injection site afterwards and took Tylenol with relief. States he has continued taking atenolol 12.5 mg daily and on 3 occasions over the last week he has taken an additional 12.5 mg for palpitations. He states he gets almost immediate relief from atenolol. Reports palpitations are better today. Asked about recent HR readings and he reports HR has been maintained > 50 bpm. Advised him to continue to monitor HR while taking atenolol and not to take additional doses if HR < 50 bpm. States he has a shingles vaccine scheduled for Monday, his first dose. I advised that he should consider postponing that appointment until his palpitations have resolved. Scheduled his follow-up with Dr. Harrington Challenger for May per recall and advised him to call back prior to that time with questions or concerns. He verbalized understanding and agreement with plan and thanked me for the call.

## 2020-06-16 ENCOUNTER — Telehealth: Payer: Self-pay | Admitting: Internal Medicine

## 2020-06-16 NOTE — Telephone Encounter (Signed)
Patient c/o Palpitations:  High priority if patient c/o lightheadedness, shortness of breath, or chest pain  1) How long have you had palpitations/irregular HR/ Afib? Are you having the symptoms now? Palpitations, beats 2x then skips 1  2) Are you currently experiencing lightheadedness, SOB or CP? no  3) Do you have a history of afib (atrial fibrillation) or irregular heart rhythm? yes  4) Have you checked your BP or HR? (document readings if available): BP 128/68 HR 62, BP 134/64 HR 58  5) Are you experiencing any other symptoms? No. States he started having an irregular HR after his booster shot 3 weeks ago. Was exposed to Covid last week and was told to wait 4 or 5 days before he got tested. He was to get tested over the weekend but the testing sites were closed. States today makes 7 days but he is showing no symptoms.

## 2020-06-16 NOTE — Telephone Encounter (Signed)
Fay Records, MD 06/12/20 4:18 PM Note I spoke with pt     Reviewed with pt   I would also recomm holding back on Shingles vaccine   Still having some skips   If continues call back in a couple weeks and would sched a monitor.       This morning he had a lot of extra skips and wanted to report.  He is feeling fine this afternoon.  Has been waiting to take atenolol several hours after taking Pepcid.  I adv it would be okay to take it at the same time as Pepcid as needed.    He is still feeling well after his wife was exposed to Covid last Monday. He will call back after a couple weeks if he is continuing to have more frequent palpitations as recommended by Dr. Harrington Challenger.

## 2020-07-15 ENCOUNTER — Telehealth: Payer: Self-pay | Admitting: Internal Medicine

## 2020-07-15 NOTE — Telephone Encounter (Addendum)
Patient reported due to palpitations he took an extra 1/2 tablet of atenolol yesterday because he was having a lot of extra beats.  His Covid booster was Dec 21 and he wonders if this is related because they have been worse lately.  He is aware to call back if symptoms worsen  and we will plan for him to wear a monitor as was discussed between he and Dr. Harrington Challenger.

## 2020-07-15 NOTE — Telephone Encounter (Signed)
New message:     Patient calling stating that he is having skipping heart beat . Patient would like for some to call him.

## 2020-07-18 DIAGNOSIS — R002 Palpitations: Secondary | ICD-10-CM | POA: Diagnosis not present

## 2020-07-23 DIAGNOSIS — Z8551 Personal history of malignant neoplasm of bladder: Secondary | ICD-10-CM | POA: Diagnosis not present

## 2020-08-01 ENCOUNTER — Other Ambulatory Visit: Payer: Self-pay | Admitting: Internal Medicine

## 2020-08-18 ENCOUNTER — Other Ambulatory Visit: Payer: Self-pay | Admitting: Family Medicine

## 2020-08-23 ENCOUNTER — Other Ambulatory Visit: Payer: Self-pay | Admitting: Family Medicine

## 2020-08-26 ENCOUNTER — Other Ambulatory Visit (INDEPENDENT_AMBULATORY_CARE_PROVIDER_SITE_OTHER): Payer: Self-pay | Admitting: Otolaryngology

## 2020-09-04 ENCOUNTER — Ambulatory Visit (INDEPENDENT_AMBULATORY_CARE_PROVIDER_SITE_OTHER): Payer: PPO | Admitting: Family Medicine

## 2020-09-04 ENCOUNTER — Encounter: Payer: Self-pay | Admitting: Family Medicine

## 2020-09-04 ENCOUNTER — Other Ambulatory Visit: Payer: Self-pay

## 2020-09-04 VITALS — BP 106/55 | HR 58 | Temp 97.7°F | Ht 72.0 in | Wt 201.4 lb

## 2020-09-04 DIAGNOSIS — R002 Palpitations: Secondary | ICD-10-CM

## 2020-09-04 DIAGNOSIS — W57XXXA Bitten or stung by nonvenomous insect and other nonvenomous arthropods, initial encounter: Secondary | ICD-10-CM | POA: Diagnosis not present

## 2020-09-04 DIAGNOSIS — H66002 Acute suppurative otitis media without spontaneous rupture of ear drum, left ear: Secondary | ICD-10-CM

## 2020-09-04 DIAGNOSIS — S40861A Insect bite (nonvenomous) of right upper arm, initial encounter: Secondary | ICD-10-CM | POA: Diagnosis not present

## 2020-09-04 MED ORDER — TRIAMCINOLONE ACETONIDE 0.1 % EX CREA: 1.0000 "application " | TOPICAL_CREAM | Freq: Three times a day (TID) | CUTANEOUS | 0 refills | Status: DC

## 2020-09-04 MED ORDER — DOXYCYCLINE HYCLATE 100 MG PO CAPS: 100.0000 mg | ORAL_CAPSULE | Freq: Two times a day (BID) | ORAL | 0 refills | Status: DC

## 2020-09-04 NOTE — Progress Notes (Signed)
Chief Complaint  Patient presents with  . Tick Removal    HPI  Patient presents today for ear pain at the left ear internally. Hearing is about the same. The right is normal. He cleans them with q-tips, but just on the outside. HE wears hearing aids.   Also right upper arm has five red patches. He found a tick rolling around at the spot yesterday. It wasn't attached, very small deer tick. It was in the area of the red spots. The spots have been itching and irritated.  No fever or chills. No bullseye rash.    Palpitations since he had Covid booster in December 2021. He had some yesterday. He was placed o atenolol 25 mg, 1/2 tab daily. This reduced the palpitations. However, heart rate drops sometimes. He was told to go to the E.D. if it drops below  50. HE denies any LOC.  PMH: Smoking status noted RDE:YCXKGY of Systems  Constitutional: Negative for chills, fever and malaise/fatigue.  HENT: Positive for congestion, ear pain and hearing loss. Negative for ear discharge.   Eyes: Negative for discharge.  Respiratory: Negative for cough and shortness of breath.   Cardiovascular: Positive for palpitations. Negative for chest pain.  Gastrointestinal: Negative for nausea.  Neurological: Negative for dizziness.   by  Objective: BP (!) 106/55   Pulse (!) 58   Temp 97.7 F (36.5 C)   Ht 6' (1.829 m)   Wt 201 lb 6.4 oz (91.4 kg)   SpO2 95%   BMI 27.31 kg/m  Gen: NAD, alert, cooperative with exam HEENT: NCAT, EOMI, PERRL Let TM has fluid posteriorly CV: RRR, good S1/S2, no murmur Ext: No edema, warm Skin: 5 papules 4 -6 mm each on the right deltoid area and axilla. have erythema and a central pinpoint nidus. No tick found. Neuro: Alert and oriented, No gross deficits  Assessment and plan:  1. Palpitations   2. Acute suppurative otitis media of left ear without spontaneous rupture of tympanic membrane, recurrence not specified   3. Tick bite of right upper arm, initial encounter      Meds ordered this encounter  Medications  . doxycycline (VIBRAMYCIN) 100 MG capsule    Sig: Take 1 capsule (100 mg total) by mouth 2 (two) times daily.    Dispense:  20 capsule    Refill:  0  . triamcinolone (KENALOG) 0.1 %    Sig: Apply 1 application topically 3 (three) times daily. Avoid face and genitalia    Dispense:  45 g    Refill:  0    No orders of the defined types were placed in this encounter.   Follow up as needed.  Claretta Fraise, MD

## 2020-09-08 DIAGNOSIS — K219 Gastro-esophageal reflux disease without esophagitis: Secondary | ICD-10-CM | POA: Diagnosis not present

## 2020-09-08 DIAGNOSIS — K2270 Barrett's esophagus without dysplasia: Secondary | ICD-10-CM | POA: Diagnosis not present

## 2020-09-25 ENCOUNTER — Telehealth: Payer: Self-pay | Admitting: Internal Medicine

## 2020-09-25 NOTE — Telephone Encounter (Signed)
Pt reports since his 3rd covid shot he notes a lot more skips.  He usually takes 12.5 mg daily around 10 am, he does pretty well.  Usually he does not take an extra half tablet but this week he has needed to use it more often (BID) because of all the extra beats he's feeling.  His HR this afternoon is 47.   He does not feel bad, just tired.  He will take it easy today and continue to monitor HR/palpitaitons.

## 2020-09-25 NOTE — Telephone Encounter (Signed)
STAT if HR is under 50 or over 120 (normal HR is 60-100 beats per minute)  1) What is your heart rate? BP 136/71 HR 47  2) Do you have a log of your heart rate readings (document readings)?   3) Do you have any other symptoms? Weak and lightheaded   Patient states he was told to call if his HR went under 50. He states it is 47 right now and he feels weak and lightheaded.

## 2020-10-10 ENCOUNTER — Other Ambulatory Visit: Payer: Self-pay

## 2020-10-10 ENCOUNTER — Ambulatory Visit (INDEPENDENT_AMBULATORY_CARE_PROVIDER_SITE_OTHER): Payer: PPO | Admitting: Family Medicine

## 2020-10-10 ENCOUNTER — Encounter: Payer: Self-pay | Admitting: Family Medicine

## 2020-10-10 VITALS — BP 144/71 | HR 66 | Temp 97.6°F | Ht 73.0 in | Wt 199.6 lb

## 2020-10-10 DIAGNOSIS — H9201 Otalgia, right ear: Secondary | ICD-10-CM

## 2020-10-10 MED ORDER — AMOXICILLIN 500 MG PO CAPS: 1000.0000 mg | ORAL_CAPSULE | Freq: Three times a day (TID) | ORAL | 0 refills | Status: AC

## 2020-10-10 NOTE — Progress Notes (Signed)
Assessment & Plan:  1. Right ear pain Treating empirically for acute otitis media as I am unable to visualize his ear canal or tympanic membrane and patient reports this is what his ENT has to do every time he has the symptoms. - amoxicillin (AMOXIL) 500 MG capsule; Take 2 capsules (1,000 mg total) by mouth 3 (three) times daily for 7 days.  Dispense: 42 capsule; Refill: 0   Follow up plan: Return if symptoms worsen or fail to improve.  Hendricks Limes, MSN, APRN, FNP-C [facility]  Subjective:   Patient ID: Ronald Russell., male    DOB: [birth year], 86 y.o.   MRN: [id number]  HPI: Ronald Russell. is a 86 y.o. male presenting on 10/10/2020 for Ear Pain (X 2 days- right ear. Patient states it was draining this morning. ) and Dizziness (X 3 days/)  Patient is accompanied by his wife who he is okay with being present.  Patient reports dizziness for the past week that has worsened in the past 3 days, right ear pain x2 days, and drainage from his right ear today.  He is established with ENT but states he was unable to get in for a visit as they were closed today.  He has a prescription for Cortisporin at home which she has been using for 2 days.  He states every time this happens he has to be treated for an ear infection.   ROS: Negative unless specifically indicated above in HPI.   Relevant past medical history reviewed and updated as indicated.   Allergies and medications reviewed and updated.   Current Outpatient Medications:  .  acetaminophen (TYLENOL) 650 MG CR tablet, Take 650 mg by mouth every 6 (six) hours as needed for pain., Disp: , Rfl:  .  acidophilus (RISAQUAD) CAPS capsule, Take 1 capsule by mouth daily., Disp: , Rfl:  .  aspirin EC 81 MG tablet, Take 1 tablet (81 mg total) by mouth daily., Disp: 90 tablet, Rfl: 3 .  atenolol (TENORMIN) 25 MG tablet, TAKE 1/2 TABLET BY MOUTH TWICE A DAY, Disp: 90 tablet, Rfl: 3 .  Cyanocobalamin (VITAMIN B-12  PO), Take by mouth., Disp: , Rfl:  .  famotidine (PEPCID) 20 MG tablet, TAKE 1 TABLET (20 MG TOTAL) BY MOUTH DAILY AS NEEDED FOR HEARTBURN OR INDIGESTION., Disp: 90 tablet, Rfl: 0 .  fluticasone (FLONASE) 50 MCG/ACT nasal spray, Place 2 sprays into both nostrils daily. (Patient taking differently: Place 2 sprays into both nostrils daily as needed for allergies.), Disp: 16 g, Rfl: 6 .  hydroxypropyl methylcellulose / hypromellose (ISOPTO TEARS / GONIOVISC) 2.5 % ophthalmic solution, Place 1 drop into both eyes 3 (three) times daily as needed for dry eyes., Disp: , Rfl:  .  lansoprazole (PREVACID) 15 MG capsule, Take 15 mg by mouth daily at 12 noon., Disp: , Rfl:  .  neomycin-polymyxin-hydrocortisone (CORTISPORIN) 3.5-10000-1 OTIC suspension, PLACE 4-5 DROPS IN RIGHT EAR TWICE A DAY AS NEEDED FOR DRAINGE, Disp: 10 mL, Rfl: 1 .  omeprazole (PRILOSEC) 20 MG capsule, Take 20 mg by mouth daily., Disp: , Rfl:  .  phenazopyridine (PYRIDIUM) 200 MG tablet, Take 1 tablet (200 mg total) by mouth 3 (three) times daily as needed for pain., Disp: 20 tablet, Rfl: 0 .  Potassium Gluconate 550 MG TABS, Take 1 tablet (550 mg total) by mouth at bedtime. Need to take 1 tab one day and 2 the next day alternating (Patient taking differently: Take 550 mg by  mouth at bedtime.), Disp: 30 tablet, Rfl: 0 .  shark liver oil-cocoa butter (PREPARATION H) 0.25-3-85.5 % suppository, Place 1 suppository rectally as needed for hemorrhoids., Disp: , Rfl:  .  sildenafil (REVATIO) 20 MG tablet, Take 1 tablet (20 mg total) by mouth daily as needed (2-5 as needed). (Patient taking differently: Take 20-100 mg by mouth daily as needed (ED).), Disp: 150 tablet, Rfl: 1 .  triamcinolone (KENALOG) 0.1 %, Apply 1 application topically 3 (three) times daily. Avoid face and genitalia, Disp: 45 g, Rfl: 0 .  Vitamin D, Cholecalciferol, 10 MCG (400 UNIT) CAPS, Take 400 Units by mouth daily., Disp: 100 capsule, Rfl: 12  Allergies  Allergen Reactions   . Uloric [Febuxostat] Palpitations, Other (See Comments) and Hypertension    Hypertension with palpitations and a sense of fuzziness and dizziness at the left side of his head  . Doxazosin Diarrhea  . Doxycycline Diarrhea  . Omeprazole Other (See Comments)  . Pantoprazole Other (See Comments)  . Augmentin [Amoxicillin-Pot Clavulanate] Diarrhea  . Ciprofloxacin Diarrhea  . Codeine Nausea And Vomiting and Other (See Comments)    Severe headache  . Levaquin [Levofloxacin] Diarrhea  . Oxycodone Nausea And Vomiting  . Tramadol Nausea And Vomiting  . Vicodin [Hydrocodone-Acetaminophen] Nausea And Vomiting    Objective:   BP (!) 144/71   Pulse 66   Temp 97.6 F (36.4 C) (Temporal)   Ht 6\' 1"  (1.854 m)   Wt 199 lb 9.6 oz (90.5 kg)   SpO2 93%   BMI 26.33 kg/m    Physical Exam Vitals reviewed.  Constitutional:      General: He is not in acute distress.    Appearance: Normal appearance. He is not ill-appearing, toxic-appearing or diaphoretic.  HENT:     Head: Normocephalic and atraumatic.     Right Ear: External ear normal. Drainage (ear wax) present.     Left Ear: Tympanic membrane, ear canal and external ear normal. There is no impacted cerumen.     Ears:     Comments: Ear canal so narrow, I am unable to see his TM. Eyes:     General: No scleral icterus.       Right eye: No discharge.        Left eye: No discharge.     Conjunctiva/sclera: Conjunctivae normal.  Cardiovascular:     Rate and Rhythm: Normal rate.  Pulmonary:     Effort: Pulmonary effort is normal. No respiratory distress.  Musculoskeletal:        General: Normal range of motion.     Cervical back: Normal range of motion.  Skin:    General: Skin is warm and dry.  Neurological:     Mental Status: He is alert and oriented to person, place, and time. Mental status is at baseline.  Psychiatric:        Mood and Affect: Mood normal.        Behavior: Behavior normal.        Thought Content: Thought content  normal.        Judgment: Judgment normal.

## 2020-10-17 ENCOUNTER — Ambulatory Visit (INDEPENDENT_AMBULATORY_CARE_PROVIDER_SITE_OTHER): Payer: PPO | Admitting: Otolaryngology

## 2020-10-17 ENCOUNTER — Other Ambulatory Visit: Payer: Self-pay

## 2020-10-17 VITALS — Temp 96.6°F

## 2020-10-17 DIAGNOSIS — H6121 Impacted cerumen, right ear: Secondary | ICD-10-CM | POA: Diagnosis not present

## 2020-10-17 DIAGNOSIS — H66001 Acute suppurative otitis media without spontaneous rupture of ear drum, right ear: Secondary | ICD-10-CM | POA: Diagnosis not present
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
## 2020-10-21 ENCOUNTER — Ambulatory Visit: Payer: PPO | Admitting: Internal Medicine

## 2020-10-21 ENCOUNTER — Encounter: Payer: Self-pay | Admitting: Internal Medicine

## 2020-10-21 ENCOUNTER — Other Ambulatory Visit: Payer: Self-pay

## 2020-10-21 VITALS — BP 114/56 | HR 61 | Ht 71.0 in | Wt 201.0 lb

## 2020-10-21 DIAGNOSIS — R42 Dizziness and giddiness: Secondary | ICD-10-CM

## 2020-10-21 DIAGNOSIS — I10 Essential (primary) hypertension: Secondary | ICD-10-CM | POA: Diagnosis not present

## 2020-10-21 DIAGNOSIS — I2510 Atherosclerotic heart disease of native coronary artery without angina pectoris: Secondary | ICD-10-CM

## 2020-10-21 DIAGNOSIS — E559 Vitamin D deficiency, unspecified: Secondary | ICD-10-CM | POA: Diagnosis not present

## 2020-10-21 DIAGNOSIS — E782 Mixed hyperlipidemia: Secondary | ICD-10-CM

## 2020-10-21 NOTE — Patient Instructions (Signed)
Medication Instructions:  No changes *If you need a refill on your cardiac medications before your next appointment, please call your pharmacy*   Lab Work: Today: lipids, cbc, bmet, tsh, vit D If you have labs (blood work) drawn today and your tests are completely normal, you will receive your results only by: Marland Kitchen MyChart Message (if you have MyChart) OR . A paper copy in the mail If you have any lab test that is abnormal or we need to change your treatment, we will call you to review the results.   Testing/Procedures: none   Follow-Up: At [facility], you and your health needs are our priority.  As part of our continuing mission to provide you with exceptional heart care, we have created designated Provider Care Teams.  These Care Teams include your primary Cardiologist (physician) and Advanced Practice Providers (APPs -  Physician Assistants and Nurse Practitioners) who all work together to provide you with the care you need, when you need it.  Your next appointment:   10 month(s)  The format for your next appointment:   In Person  Provider:   You may see Dorris Carnes, MD or one of the following Advanced Practice Providers on your designated Care Team:    Richardson Dopp, PA-C  Robbie Lis, [place]  Other Instructions
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
## 2020-10-22 LAB — BASIC METABOLIC PANEL
BUN/Creatinine Ratio: 14 (ref 10–24)
BUN: 18 mg/dL (ref 8–27)
CO2: 21 mmol/L (ref 20–29)
Calcium: 9.3 mg/dL (ref 8.6–10.2)
Chloride: 102 mmol/L (ref 96–106)
Creatinine, Ser: 1.32 mg/dL — ABNORMAL HIGH (ref 0.76–1.27)
Glucose: 100 mg/dL — ABNORMAL HIGH (ref 65–99)
Potassium: 4.3 mmol/L (ref 3.5–5.2)
Sodium: 138 mmol/L (ref 134–144)
eGFR: 53 mL/min/{1.73_m2} — ABNORMAL LOW (ref >59)

## 2020-10-22 LAB — LIPID PANEL
Chol/HDL Ratio: 5.2 ratio — ABNORMAL HIGH (ref 0.0–5.0)
Cholesterol, Total: 176 mg/dL (ref 100–199)
HDL: 34 mg/dL — ABNORMAL LOW (ref >39)
LDL Chol Calc (NIH): 105 mg/dL — ABNORMAL HIGH (ref 0–99)
Triglycerides: 212 mg/dL — ABNORMAL HIGH (ref 0–149)
VLDL Cholesterol Cal: 37 mg/dL (ref 5–40)

## 2020-10-22 LAB — CBC
Hematocrit: 44 % (ref 37.5–51.0)
Hemoglobin: 14.4 g/dL (ref 13.0–17.7)
MCH: 28.7 pg (ref 26.6–33.0)
MCHC: 32.7 g/dL (ref 31.5–35.7)
MCV: 88 fL (ref 79–97)
Platelets: 157 10*3/uL (ref 150–450)
RBC: 5.01 x10E6/uL (ref 4.14–5.80)
RDW: 15.4 % (ref 11.6–15.4)
WBC: 6 10*3/uL (ref 3.4–10.8)

## 2020-10-22 LAB — TSH: TSH: 3.81 u[IU]/mL (ref 0.450–4.500)

## 2020-10-22 LAB — VITAMIN D 25 HYDROXY (VIT D DEFICIENCY, FRACTURES): Vit D, 25-Hydroxy: 38 ng/mL (ref 30.0–100.0)

## 2020-11-03 ENCOUNTER — Ambulatory Visit (INDEPENDENT_AMBULATORY_CARE_PROVIDER_SITE_OTHER): Payer: PPO | Admitting: Otolaryngology

## 2020-11-05 ENCOUNTER — Telehealth (INDEPENDENT_AMBULATORY_CARE_PROVIDER_SITE_OTHER): Payer: Self-pay | Admitting: Otolaryngology

## 2020-11-05 ENCOUNTER — Other Ambulatory Visit (INDEPENDENT_AMBULATORY_CARE_PROVIDER_SITE_OTHER): Payer: Self-pay | Admitting: Otolaryngology

## 2020-11-05 MED ORDER — AMOXICILLIN-POT CLAVULANATE 875-125 MG PO TABS: 1.0000 | ORAL_TABLET | Freq: Two times a day (BID) | ORAL | 0 refills | Status: DC

## 2020-11-05 NOTE — Telephone Encounter (Signed)
Ronald Russell calls in because of persistent drainage from his right ear.  He has used eardrops which seemed to help some.  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  I called in Augmentin 875 mg twice daily for 10 days to the patient's pharmacy.  He states that he is taken penicillin frequently in the past. He will follow-up as needed

## 2020-11-19 ENCOUNTER — Ambulatory Visit (INDEPENDENT_AMBULATORY_CARE_PROVIDER_SITE_OTHER): Payer: PPO | Admitting: Otolaryngology

## 2020-11-19 ENCOUNTER — Other Ambulatory Visit: Payer: Self-pay

## 2020-11-19 VITALS — Temp 97.3°F

## 2020-11-19 DIAGNOSIS — H66004 Acute suppurative otitis media without spontaneous rupture of ear drum, recurrent, right ear: Secondary | ICD-10-CM | POA: Diagnosis not present

## 2020-11-19 DIAGNOSIS — H7291 Unspecified perforation of tympanic membrane, right ear: Secondary | ICD-10-CM
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
## 2020-12-03 ENCOUNTER — Ambulatory Visit (INDEPENDENT_AMBULATORY_CARE_PROVIDER_SITE_OTHER): Payer: PPO

## 2020-12-03 VITALS — Ht 71.0 in | Wt 201.0 lb

## 2020-12-03 DIAGNOSIS — Z23 Encounter for immunization: Secondary | ICD-10-CM | POA: Insufficient documentation

## 2020-12-03 DIAGNOSIS — N400 Enlarged prostate without lower urinary tract symptoms: Secondary | ICD-10-CM | POA: Insufficient documentation

## 2020-12-03 DIAGNOSIS — J309 Allergic rhinitis, unspecified: Secondary | ICD-10-CM | POA: Insufficient documentation

## 2020-12-03 DIAGNOSIS — C679 Malignant neoplasm of bladder, unspecified: Secondary | ICD-10-CM | POA: Insufficient documentation

## 2020-12-03 DIAGNOSIS — G4452 New daily persistent headache (NDPH): Secondary | ICD-10-CM | POA: Insufficient documentation

## 2020-12-03 DIAGNOSIS — Z800 Family history of malignant neoplasm of digestive organs: Secondary | ICD-10-CM | POA: Insufficient documentation

## 2020-12-03 DIAGNOSIS — H90A31 Mixed conductive and sensorineural hearing loss, unilateral, right ear with restricted hearing on the contralateral side: Secondary | ICD-10-CM | POA: Insufficient documentation

## 2020-12-03 DIAGNOSIS — Z860100 Personal history of colon polyps, unspecified: Secondary | ICD-10-CM | POA: Insufficient documentation

## 2020-12-03 DIAGNOSIS — F528 Other sexual dysfunction not due to a substance or known physiological condition: Secondary | ICD-10-CM | POA: Insufficient documentation

## 2020-12-03 DIAGNOSIS — Z0000 Encounter for general adult medical examination without abnormal findings: Secondary | ICD-10-CM | POA: Diagnosis not present

## 2020-12-03 NOTE — Patient Instructions (Signed)
[name] , Thank you for taking time to come for your Medicare Wellness Visit. I appreciate your ongoing commitment to your health goals. Please review the following plan we discussed and let me know if I can assist you in the future.   Screening recommendations/referrals: Colonoscopy: Done 08/03/2016 - repeat not required Recommended yearly ophthalmology/optometry visit for glaucoma screening and checkup Recommended yearly dental visit for hygiene and checkup  Vaccinations: Influenza vaccine: Due in Fall 2022 Pneumococcal vaccine: Done 08/13/2017  & 06/2001 Tdap vaccine: Done 09/18/2018 - Repeat in 10 years Shingles vaccine: First dose done 01/25/2020; due for second dose   Covid-19: Done 06/26/2019, 07/27/2019 & 06/03/2020; due for second booster  Conditions/risks identified: Aim for 30 minutes of exercise or brisk walking each day, drink 6-8 glasses of water and eat lots of fruits and vegetables.  Next appointment: Follow up in one year for your annual wellness visit.   Preventive Care 26 Years and Older, Male  Preventive care refers to lifestyle choices and visits with your health care provider that can promote health and wellness. What does preventive care include? A yearly physical exam. This is also called an annual well check. Dental exams once or twice a year. Routine eye exams. Ask your health care provider how often you should have your eyes checked. Personal lifestyle choices, including: Daily care of your teeth and gums. Regular physical activity. Eating a healthy diet. Avoiding tobacco and drug use. Limiting alcohol use. Practicing safe sex. Taking low doses of aspirin every day. Taking vitamin and mineral supplements as recommended by your health care provider. What happens during an annual well check? The services and screenings done by your health care provider during your annual well check will depend on your age, overall health, lifestyle risk factors, and family  history of disease. Counseling  Your health care provider may ask you questions about your: Alcohol use. Tobacco use. Drug use. Emotional well-being. Home and relationship well-being. Sexual activity. Eating habits. History of falls. Memory and ability to understand (cognition). Work and work Statistician. Screening  You may have the following tests or measurements: Height, weight, and BMI. Blood pressure. Lipid and cholesterol levels. These may be checked every 5 years, or more frequently if you are over 16 years old. Skin check. Lung cancer screening. You may have this screening every year starting at age 7 if you have a 30-pack-year history of smoking and currently smoke or have quit within the past 15 years. Fecal occult blood test (FOBT) of the stool. You may have this test every year starting at age 73. Flexible sigmoidoscopy or colonoscopy. You may have a sigmoidoscopy every 5 years or a colonoscopy every 10 years starting at age 80. Prostate cancer screening. Recommendations will vary depending on your family history and other risks. Hepatitis C blood test. Hepatitis B blood test. Sexually transmitted disease (STD) testing. Diabetes screening. This is done by checking your blood sugar (glucose) after you have not eaten for a while (fasting). You may have this done every 1-3 years. Abdominal aortic aneurysm (AAA) screening. You may need this if you are a current or former smoker. Osteoporosis. You may be screened starting at age 18 if you are at high risk. Talk with your health care provider about your test results, treatment options, and if necessary, the need for more tests. Vaccines  Your health care provider may recommend certain vaccines, such as: Influenza vaccine. This is recommended every year. Tetanus, diphtheria, and acellular pertussis (Tdap, Td) vaccine. You  may need a Td booster every 10 years. Zoster vaccine. You may need this after age 8. Pneumococcal  13-valent conjugate (PCV13) vaccine. One dose is recommended after age 31. Pneumococcal polysaccharide (PPSV23) vaccine. One dose is recommended after age 23. Talk to your health care provider about which screenings and vaccines you need and how often you need them. This information is not intended to replace advice given to you by your health care provider. Make sure you discuss any questions you have with your health care provider. Document Released: 06/27/2015 Document Revised: 02/18/2016 Document Reviewed: 04/01/2015 Elsevier Interactive Patient Education  2017 [employer] Prevention in the Home Falls can cause injuries. They can happen to people of all ages. There are many things you can do to make your home safe and to help prevent falls. What can I do on the outside of my home? Regularly fix the edges of walkways and driveways and fix any cracks. Remove anything that might make you trip as you walk through a door, such as a raised step or threshold. Trim any bushes or trees on the path to your home. Use bright outdoor lighting. Clear any walking paths of anything that might make someone trip, such as rocks or tools. Regularly check to see if handrails are loose or broken. Make sure that both sides of any steps have handrails. Any raised decks and porches should have guardrails on the edges. Have any leaves, snow, or ice cleared regularly. Use sand or salt on walking paths during winter. Clean up any spills in your garage right away. This includes oil or grease spills. What can I do in the bathroom? Use night lights. Install grab bars by the toilet and in the tub and shower. Do not use towel bars as grab bars. Use non-skid mats or decals in the tub or shower. If you need to sit down in the shower, use a plastic, non-slip stool. Keep the floor dry. Clean up any water that spills on the floor as soon as it happens. Remove soap buildup in the tub or shower regularly. Attach  bath mats securely with double-sided non-slip rug tape. Do not have throw rugs and other things on the floor that can make you trip. What can I do in the bedroom? Use night lights. Make sure that you have a light by your bed that is easy to reach. Do not use any sheets or blankets that are too big for your bed. They should not hang down onto the floor. Have a firm chair that has side arms. You can use this for support while you get dressed. Do not have throw rugs and other things on the floor that can make you trip. What can I do in the kitchen? Clean up any spills right away. Avoid walking on wet floors. Keep items that you use a lot in easy-to-reach places. If you need to reach something above you, use a strong step stool that has a grab bar. Keep electrical cords out of the way. Do not use floor polish or wax that makes floors slippery. If you must use wax, use non-skid floor wax. Do not have throw rugs and other things on the floor that can make you trip. What can I do with my stairs? Do not leave any items on the stairs. Make sure that there are handrails on both sides of the stairs and use them. Fix handrails that are broken or loose. Make sure that handrails are as long as the  stairways. Check any carpeting to make sure that it is firmly attached to the stairs. Fix any carpet that is loose or worn. Avoid having throw rugs at the top or bottom of the stairs. If you do have throw rugs, attach them to the floor with carpet tape. Make sure that you have a light switch at the top of the stairs and the bottom of the stairs. If you do not have them, ask someone to add them for you. What else can I do to help prevent falls? Wear shoes that: Do not have high heels. Have rubber bottoms. Are comfortable and fit you well. Are closed at the toe. Do not wear sandals. If you use a stepladder: Make sure that it is fully opened. Do not climb a closed stepladder. Make sure that both sides of the  stepladder are locked into place. Ask someone to hold it for you, if possible. Clearly mark and make sure that you can see: Any grab bars or handrails. First and last steps. Where the edge of each step is. Use tools that help you move around (mobility aids) if they are needed. These include: Canes. Walkers. Scooters. Crutches. Turn on the lights when you go into a dark area. Replace any light bulbs as soon as they burn out. Set up your furniture so you have a clear path. Avoid moving your furniture around. If any of your floors are uneven, fix them. If there are any pets around you, be aware of where they are. Review your medicines with your doctor. Some medicines can make you feel dizzy. This can increase your chance of falling. Ask your doctor what other things that you can do to help prevent falls. This information is not intended to replace advice given to you by your health care provider. Make sure you discuss any questions you have with your health care provider. Document Released: 03/27/2009 Document Revised: 11/06/2015 Document Reviewed: 07/05/2014 Elsevier Interactive Patient Education  2017 [employer].
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
## 2020-12-04 ENCOUNTER — Ambulatory Visit (INDEPENDENT_AMBULATORY_CARE_PROVIDER_SITE_OTHER): Payer: PPO | Admitting: Family Medicine

## 2020-12-04 ENCOUNTER — Encounter: Payer: Self-pay | Admitting: Family Medicine

## 2020-12-04 DIAGNOSIS — A084 Viral intestinal infection, unspecified: Secondary | ICD-10-CM | POA: Diagnosis not present

## 2020-12-04 MED ORDER — PROMETHAZINE HCL 12.5 MG PO TABS: 12.5000 mg | ORAL_TABLET | Freq: Three times a day (TID) | ORAL | 0 refills | Status: DC | PRN

## 2020-12-04 NOTE — Progress Notes (Signed)
Virtual Visit via Telephone Note  I connected with Ronald Fear Sr. on 12/04/20 at 11:55 AM by telephone and verified that I am speaking with the correct person using two identifiers. Ronald Fear Sr. is currently located at home and his wife is currently with him during this visit. The provider, Loman Brooklyn, FNP is located in their office at time of visit.  I discussed the limitations, risks, security and privacy concerns of performing an evaluation and management service by telephone and the availability of in person appointments. I also discussed with the patient that there may be a patient responsible charge related to this service. The patient expressed understanding and agreed to proceed.  Subjective: PCP: Claretta Fraise, MD  Chief Complaint  Patient presents with   GI Problem   Patient reports abdominal pain, nausea, and diarrhea that started 3 days ago.  Denies vomiting.  He reports a temperature of 99.1 last night.  He is taking Tylenol for the low-grade fever.  He is taking Imodium for the diarrhea.  He does report he is no longer having watery stools, although they are not quite formed yet either. [facility] has prescribed him Zofran which has not been helpful.    ROS: Per HPI  Current Outpatient Medications:    acetaminophen (TYLENOL) 650 MG CR tablet, Take 650 mg by mouth every 6 (six) hours as needed for pain., Disp: , Rfl:    acidophilus (RISAQUAD) CAPS capsule, Take 1 capsule by mouth daily., Disp: , Rfl:    aspirin EC 81 MG tablet, Take 1 tablet (81 mg total) by mouth daily., Disp: 90 tablet, Rfl: 3   atenolol (TENORMIN) 25 MG tablet, TAKE 1/2 TABLET BY MOUTH TWICE A DAY, Disp: 90 tablet, Rfl: 3   Cyanocobalamin (VITAMIN B-12 PO), Take by mouth., Disp: , Rfl:    fluticasone (FLONASE) 50 MCG/ACT nasal spray, Place 2 sprays into both nostrils daily., Disp: 16 g, Rfl: 6   hydroxypropyl methylcellulose / hypromellose (ISOPTO TEARS / GONIOVISC) 2.5 % ophthalmic  solution, Place 1 drop into both eyes 3 (three) times daily as needed for dry eyes., Disp: , Rfl:    lansoprazole (PREVACID) 15 MG capsule, Take 15 mg by mouth daily at 12 noon., Disp: , Rfl:    neomycin-polymyxin-hydrocortisone (CORTISPORIN) 3.5-10000-1 OTIC suspension, PLACE 4-5 DROPS IN RIGHT EAR TWICE A DAY AS NEEDED FOR DRAINGE, Disp: 10 mL, Rfl: 1   ondansetron (ZOFRAN-ODT) 4 MG disintegrating tablet, Take 4 mg by mouth every 6 (six) hours., Disp: , Rfl:    Potassium Gluconate 550 MG TABS, Take 1 tablet (550 mg total) by mouth at bedtime. Need to take 1 tab one day and 2 the next day alternating, Disp: 30 tablet, Rfl: 0   shark liver oil-cocoa butter (PREPARATION H) 0.25-3-85.5 % suppository, Place 1 suppository rectally as needed for hemorrhoids., Disp: , Rfl:    sildenafil (REVATIO) 20 MG tablet, Take 1 tablet (20 mg total) by mouth daily as needed (2-5 as needed). (Patient taking differently: Take 20-100 mg by mouth daily as needed (ED).), Disp: 150 tablet, Rfl: 1   Vitamin D, Cholecalciferol, 10 MCG (400 UNIT) CAPS, Take 400 Units by mouth daily., Disp: 100 capsule, Rfl: 12  Allergies  Allergen Reactions   Uloric [Febuxostat] Palpitations, Other (See Comments) and Hypertension    Hypertension with palpitations and a sense of fuzziness and dizziness at the left side of his head   Doxazosin Diarrhea   Doxycycline Diarrhea   Omeprazole Other (See  Comments)   Pantoprazole Other (See Comments)   Augmentin [Amoxicillin-Pot Clavulanate] Diarrhea   Ciprofloxacin Diarrhea   Codeine Nausea And Vomiting and Other (See Comments)    Severe headache   Levaquin [Levofloxacin] Diarrhea   Oxycodone Nausea And Vomiting   Tramadol Nausea And Vomiting   Vicodin [Hydrocodone-Acetaminophen] Nausea And Vomiting   Past Medical History:  Diagnosis Date   Allergy    Anxiety    Bladder cancer ([place]) 2010   Tx with BCG   CAD (coronary artery disease)    LHC 6/19: pLAD 75, mLAD 100, D2 75; pLCx 80, mLCx  70, EF 55-65 >> s/p CABG // Echo 3/18: EF 60-65, Gr 2 DD   Chronic bronchitis ([place])    "yearly the last 3 yrs" (02/13/2013)   Dysrhythmia    GERD (gastroesophageal reflux disease)    History of blood transfusion 1949   "seeral" (02/13/2013)   HOH (hard of hearing)    Hypertension    Ocular migraine    "not often" (02/13/2013)   Pneumonia    "last time ~ 2 yr ago; had it before that too" (02/13/2013)   Seasonal allergies    Shingles    has neuropathy since it happened in 6/17   Temporal arteritis ([facility])    Thrombocytopenia ([place]) 11/29/2016    Observations/Objective: A&O  No respiratory distress or wheezing audible over the phone Mood, judgement, and thought processes all WNL   Assessment and Plan: 1. Viral gastroenteritis Tylenol for pain/fever. Stop Zofran. Start phenergan. Advised to let us know if symptoms worsen or do not continue to improve.  - promethazine (PHENERGAN) 12.5 MG tablet; Take 1 tablet (12.5 mg total) by mouth every 8 (eight) hours as needed for nausea or vomiting.  Dispense: 20 tablet; Refill: 0   Follow Up Instructions:  I discussed the assessment and treatment plan with the patient. The patient was provided an opportunity to ask questions and all were answered. The patient agreed with the plan and demonstrated an understanding of the instructions.   The patient was advised to call back or seek an in-person evaluation if the symptoms worsen or if the condition fails to improve as anticipated.  The above assessment and management plan was discussed with the patient. The patient verbalized understanding of and has agreed to the management plan. Patient is aware to call the clinic if symptoms persist or worsen. Patient is aware when to return to the clinic for a follow-up visit. Patient educated on when it is appropriate to go to the emergency department.   Time call ended: 12:06 PM  I provided 11 minutes of non-face-to-face time during this encounter.  Hendricks Limes, MSN, APRN, FNP-C [facility] 12/04/20

## 2020-12-08 ENCOUNTER — Ambulatory Visit (INDEPENDENT_AMBULATORY_CARE_PROVIDER_SITE_OTHER): Payer: PPO | Admitting: Family Medicine

## 2020-12-08 ENCOUNTER — Encounter: Payer: Self-pay | Admitting: Family Medicine

## 2020-12-08 ENCOUNTER — Other Ambulatory Visit: Payer: Self-pay

## 2020-12-08 VITALS — BP 125/62 | HR 63 | Ht 71.0 in | Wt 195.0 lb

## 2020-12-08 DIAGNOSIS — K30 Functional dyspepsia: Secondary | ICD-10-CM

## 2020-12-08 DIAGNOSIS — R197 Diarrhea, unspecified: Secondary | ICD-10-CM

## 2020-12-08 NOTE — Progress Notes (Signed)
BP 125/62   Pulse 63   Ht 5\' 11"  (1.803 m)   Wt 195 lb (88.5 kg)   SpO2 97%   BMI 27.20 kg/m    Subjective:   Patient ID: Ronald Fear Sr., male    DOB: September 29, 1934, 86 y.o.   MRN: [id number]  HPI: Vidit Boissonneault. is a 86 y.o. male presenting on 12/08/2020 for Abdominal Pain (Constant and can be severe) and Diarrhea (Comes and goes)   HPI Patient is coming in complaining of right upper quadrant abdominal pain and diarrhea and indigestion and bloating and gas.  He says this is been something he has been dealing with for a little over 7 days.  He says this all started after he had to go on multiple rounds of antibiotics, he thinks he was on antibiotics for a total of 30 days and then the symptoms started after that.  He says he has not noticed a difference with eating or not eating.  He does have a lot of gas and belching and burping and gas down below as well.  He is having bowel movements and sometimes they are loose and diarrhea and then sometimes they are a little more solid.  Is been going on and off over the past week although it was not a lot stronger initially and now it is improved more.  He still has a very strong smell to his stools and been having right upper quadrant and right lower quadrant abdominal cramping along with the bloating and indigestion and has some nausea as well.  He currently does take Prevacid.  Relevant past medical, surgical, family and social history reviewed and updated as indicated. Interim medical history since our last visit reviewed. Allergies and medications reviewed and updated.  Review of Systems  Constitutional:  Negative for chills and fever.  Respiratory:  Negative for shortness of breath and wheezing.   Cardiovascular:  Negative for chest pain and leg swelling.  Gastrointestinal:  Positive for abdominal distention, abdominal pain, diarrhea and nausea. Negative for anal bleeding, blood in stool, constipation and vomiting.  Genitourinary:   Negative for decreased urine volume, difficulty urinating, dysuria and flank pain.  Musculoskeletal:  Negative for back pain and gait problem.  Skin:  Negative for rash.  All other systems reviewed and are negative.  Per HPI unless specifically indicated above   Allergies as of 12/08/2020       Reactions   Uloric [febuxostat] Palpitations, Other (See Comments), Hypertension   Hypertension with palpitations and a sense of fuzziness and dizziness at the left side of his head   Doxazosin Diarrhea   Doxycycline Diarrhea   Omeprazole Other (See Comments)   Pantoprazole Other (See Comments)   Augmentin [amoxicillin-pot Clavulanate] Diarrhea   Ciprofloxacin Diarrhea   Codeine Nausea And Vomiting, Other (See Comments)   Severe headache   Levaquin [levofloxacin] Diarrhea   Oxycodone Nausea And Vomiting   Tramadol Nausea And Vomiting   Vicodin [hydrocodone-acetaminophen] Nausea And Vomiting        Medication List        Accurate as of December 08, 2020  4:48 PM. If you have any questions, ask your nurse or doctor.          acetaminophen 650 MG CR tablet Commonly known as: TYLENOL Take 650 mg by mouth every 6 (six) hours as needed for pain.   acidophilus Caps capsule Take 1 capsule by mouth daily.   aspirin EC 81 MG tablet Take 1 tablet (  81 mg total) by mouth daily.   atenolol 25 MG tablet Commonly known as: TENORMIN TAKE 1/2 TABLET BY MOUTH TWICE A DAY   fluticasone 50 MCG/ACT nasal spray Commonly known as: FLONASE Place 2 sprays into both nostrils daily.   hydroxypropyl methylcellulose / hypromellose 2.5 % ophthalmic solution Commonly known as: ISOPTO TEARS / GONIOVISC Place 1 drop into both eyes 3 (three) times daily as needed for dry eyes.   lansoprazole 15 MG capsule Commonly known as: PREVACID Take 15 mg by mouth daily at 12 noon.   neomycin-polymyxin-hydrocortisone 3.5-10000-1 OTIC suspension Commonly known as: CORTISPORIN PLACE 4-5 DROPS IN RIGHT EAR TWICE A  DAY AS NEEDED FOR DRAINGE   Potassium Gluconate 550 MG Tabs Take 1 tablet (550 mg total) by mouth at bedtime. Need to take 1 tab one day and 2 the next day alternating   promethazine 12.5 MG tablet Commonly known as: PHENERGAN Take 1 tablet (12.5 mg total) by mouth every 8 (eight) hours as needed for nausea or vomiting.   shark liver oil-cocoa butter 0.25-3-85.5 % suppository Commonly known as: PREPARATION H Place 1 suppository rectally as needed for hemorrhoids.   sildenafil 20 MG tablet Commonly known as: REVATIO Take 1 tablet (20 mg total) by mouth daily as needed (2-5 as needed). What changed:  how much to take reasons to take this   VITAMIN B-12 PO Take by mouth.   Vitamin D (Cholecalciferol) 10 MCG (400 UNIT) Caps Take 400 Units by mouth daily.         Objective:   BP 125/62   Pulse 63   Ht 5\' 11"  (1.803 m)   Wt 195 lb (88.5 kg)   SpO2 97%   BMI 27.20 kg/m   Wt Readings from Last 3 Encounters:  12/08/20 195 lb (88.5 kg)  12/03/20 201 lb (91.2 kg)  10/21/20 201 lb (91.2 kg)    Physical Exam Vitals and nursing note reviewed.  Constitutional:      General: He is not in acute distress.    Appearance: He is well-developed. He is not diaphoretic.  Eyes:     General: No scleral icterus.    Conjunctiva/sclera: Conjunctivae normal.  Neck:     Thyroid: No thyromegaly.  Cardiovascular:     Rate and Rhythm: Normal rate and regular rhythm.     Heart sounds: Normal heart sounds. No murmur heard. Pulmonary:     Effort: Pulmonary effort is normal. No respiratory distress.     Breath sounds: Normal breath sounds. No wheezing.  Abdominal:     General: Abdomen is flat. Bowel sounds are normal. There is no distension.     Tenderness: There is abdominal tenderness in the right upper quadrant, right lower quadrant, epigastric area and periumbilical area. There is no right CVA tenderness, left CVA tenderness, guarding or rebound. Negative signs include Murphy's sign and  McBurney's sign.     Hernia: No hernia is present.  Musculoskeletal:        General: Normal range of motion.     Cervical back: Neck supple.  Lymphadenopathy:     Cervical: No cervical adenopathy.  Skin:    General: Skin is warm and dry.     Findings: No rash.  Neurological:     Mental Status: He is alert and oriented to person, place, and time.     Coordination: Coordination normal.  Psychiatric:        Behavior: Behavior normal.      Assessment & Plan:   Problem  List Items Addressed This Visit   None Visit Diagnoses     Diarrhea of presumed infectious origin    -  Primary   Relevant Orders   Stool culture   Cdiff NAA+O+P+Stool Culture   Indigestion       Relevant Orders   Stool culture   Cdiff NAA+O+P+Stool Culture        Will test for C. difficile because of recently finished antibiotics and do culture for stool  Recommended adding Pepcid to his Prevacid and see if that improves his symptoms.  Follow up plan: Return if symptoms worsen or fail to improve.  Counseling provided for all of the vaccine components Orders Placed This Encounter  Procedures   Stool culture   Cdiff NAA+O+P+Stool Culture     Caryl Pina, MD [facility] 12/08/2020, 4:48 PM

## 2020-12-09 ENCOUNTER — Ambulatory Visit: Payer: PPO | Admitting: Family Medicine

## 2020-12-10 DIAGNOSIS — Z09 Encounter for follow-up examination after completed treatment for conditions other than malignant neoplasm: Secondary | ICD-10-CM | POA: Diagnosis not present

## 2020-12-10 DIAGNOSIS — Z8669 Personal history of other diseases of the nervous system and sense organs: Secondary | ICD-10-CM | POA: Diagnosis not present

## 2020-12-17 ENCOUNTER — Telehealth: Payer: Self-pay | Admitting: Internal Medicine

## 2020-12-17 DIAGNOSIS — E782 Mixed hyperlipidemia: Secondary | ICD-10-CM

## 2020-12-17 NOTE — Telephone Encounter (Signed)
PT is calling to discuss his cholesterol

## 2020-12-18 NOTE — Addendum Note (Signed)
Addended by: Rodman Key on: 12/18/2020 09:48 AM   Modules accepted: Orders

## 2020-12-18 NOTE — Telephone Encounter (Signed)
Spoke w patient.  He had tried statins in the past and got muscle pain and cramping.  The [place] doctor told him his LDL is only 5 points over the normal so would the [place] really be needed and it is so expensive.  I adv that PharmD appointment would be helpful in determining need and plan for authorizing.  He is in agreement with this plan.  He has other concerns because he is sensitive to medications but will discuss pros/cons.

## 2021-01-02 ENCOUNTER — Telehealth: Payer: Self-pay | Admitting: Family Medicine

## 2021-01-02 ENCOUNTER — Other Ambulatory Visit: Payer: Self-pay | Admitting: Family Medicine

## 2021-01-02 DIAGNOSIS — K219 Gastro-esophageal reflux disease without esophagitis: Secondary | ICD-10-CM

## 2021-01-02 MED ORDER — RANITIDINE HCL 75 MG PO TABS: 75.0000 mg | ORAL_TABLET | Freq: Two times a day (BID) | ORAL | 0 refills | Status: DC

## 2021-01-02 NOTE — Telephone Encounter (Signed)
There are a couple other PPIs that can be tried, but since he has had a reaction to 3 PPIs now, he will likely have a reaction to the others as well. Has he tried zantac or pepcid before?

## 2021-01-02 NOTE — Telephone Encounter (Signed)
Pt called stating that he was prescribed Prevacid and says everytime he takes it, he gets swimmy headed and when he stops taking it, he feels fine. Wants to know if something else can be prescribed to him.  Please advise and call patient back.

## 2021-01-02 NOTE — Telephone Encounter (Signed)
Patient reports he has not taken Zantac before.  His pharmacy is [facility] in [place]

## 2021-01-02 NOTE — Telephone Encounter (Signed)
I did not see this patient today. In another telephone encounter he reported a reaction to prevacid. He already has an allergy listed to prilosec and protonix. These are 3 different PPIs. I could send in nexium, however this is also a PPI and he will likely have another reaction.

## 2021-01-02 NOTE — Addendum Note (Signed)
Addended by: Gwenlyn Perking on: 01/02/2021 03:08 PM   Modules accepted: Orders

## 2021-01-02 NOTE — Progress Notes (Signed)
There are a couple other PPIs that can be tried, but since he has had a reaction to 3 PPIs now, he will likely have a reaction to the others as well. Has he tried zantac or pepcid before?

## 2021-01-02 NOTE — Telephone Encounter (Signed)
Tiffany,  Is there another PPI you can send?

## 2021-01-05 ENCOUNTER — Other Ambulatory Visit: Payer: Self-pay

## 2021-01-05 ENCOUNTER — Ambulatory Visit (INDEPENDENT_AMBULATORY_CARE_PROVIDER_SITE_OTHER): Payer: PPO | Admitting: Pharmacist

## 2021-01-05 DIAGNOSIS — T466X5A Adverse effect of antihyperlipidemic and antiarteriosclerotic drugs, initial encounter: Secondary | ICD-10-CM

## 2021-01-05 DIAGNOSIS — G720 Drug-induced myopathy: Secondary | ICD-10-CM

## 2021-01-05 DIAGNOSIS — E782 Mixed hyperlipidemia: Secondary | ICD-10-CM

## 2021-01-05 NOTE — Progress Notes (Signed)
Patient ID: Ronald MATOS Sr.                 DOB: 08/04/1934                    MRN: [id number]     HPI: Ronald SANTA Sr. is a 86 y.o. male patient referred to lipid clinic by Dr Harrington Challenger. PMH is significant for CAD s/p CABG in 11/2017 with post-op afib (f/u Holter in 02/2018 and 8/202 showed no afib), palpitations, normal EF on 11/2018 echo, bradycardia, HTN, HLD with statin intolerance, bladder cancer, temporal arteritis.  Pt presents today with his wife. Reports cramping on statins in the past including with atorvastatin, pravastatin, and rosuvastatin. Has also taken cholestyramine, does not recall side effect. Main complaint is ongoing dizziness/swimmyheaded feeling. Feels as though his atenolol and Prevacid are contributing. BP normal at home 505 systolic. Looks as though he has previous workup for dizziness including ENT and balance rehab which didn't help. Had a tree branch fall on his head in the past which he thinks may have contributed.  Current Medications: none Intolerances: atorvastatin 10mg  daily, pravastatin 40mg  daily, rosuvastatin 5mg  daily, cholestyramine 4g daily Risk Factors: CAD s/p CABG LDL goal: 70mg /dL  Diet: Likes chicken, salads, and vegetables, uses air fryer. Not much red meat.  Exercise: Limited due to dizziness  Family History: Alzheimer's disease in his father; Cancer in his mother, sister, and sister. There is no history of Anemia, Arrhythmia, Asthma, Clotting disorder, Fainting, Heart attack, Heart disease, Heart failure, Hyperlipidemia, Hypertension, or Migraines.  Social History: Former tobacco use 3/4 PPD for 3 years, quit at age 89. Former alcohol use, no drug use.  Labs: 10/21/20: TC 176, TG 212, HDL 34, LDL 105 (no LLT)  Past Medical History:  Diagnosis Date   Allergy    Anxiety    Bladder cancer ([place]) 2010   Tx with BCG   CAD (coronary artery disease)    LHC 6/19: pLAD 75, mLAD 100, D2 75; pLCx 80, mLCx 70, EF 55-65 >> s/p CABG // Echo 3/18: EF  60-65, Gr 2 DD   Chronic bronchitis ([place])    "yearly the last 3 yrs" (02/13/2013)   Dysrhythmia    GERD (gastroesophageal reflux disease)    History of blood transfusion 1949   "seeral" (02/13/2013)   HOH (hard of hearing)    Hypertension    Ocular migraine    "not often" (02/13/2013)   Pneumonia    "last time ~ 2 yr ago; had it before that too" (02/13/2013)   Seasonal allergies    Shingles    has neuropathy since it happened in 6/17   Temporal arteritis ([facility])    Thrombocytopenia ([place]) 11/29/2016    Current Outpatient Medications on File Prior to Visit  Medication Sig Dispense Refill   acetaminophen (TYLENOL) 650 MG CR tablet Take 650 mg by mouth every 6 (six) hours as needed for pain.     acidophilus (RISAQUAD) CAPS capsule Take 1 capsule by mouth daily.     aspirin EC 81 MG tablet Take 1 tablet (81 mg total) by mouth daily. 90 tablet 3   atenolol (TENORMIN) 25 MG tablet TAKE 1/2 TABLET BY MOUTH TWICE A DAY 90 tablet 3   Cyanocobalamin (VITAMIN B-12 PO) Take by mouth.     fluticasone (FLONASE) 50 MCG/ACT nasal spray Place 2 sprays into both nostrils daily. 16 g 6   hydroxypropyl methylcellulose / hypromellose (ISOPTO TEARS / GONIOVISC) 2.5 % ophthalmic  solution Place 1 drop into both eyes 3 (three) times daily as needed for dry eyes.     lansoprazole (PREVACID) 15 MG capsule Take 15 mg by mouth daily at 12 noon.     neomycin-polymyxin-hydrocortisone (CORTISPORIN) 3.5-10000-1 OTIC suspension PLACE 4-5 DROPS IN RIGHT EAR TWICE A DAY AS NEEDED FOR DRAINGE 10 mL 1   Potassium Gluconate 550 MG TABS Take 1 tablet (550 mg total) by mouth at bedtime. Need to take 1 tab one day and 2 the next day alternating 30 tablet 0   promethazine (PHENERGAN) 12.5 MG tablet Take 1 tablet (12.5 mg total) by mouth every 8 (eight) hours as needed for nausea or vomiting. 20 tablet 0   shark liver oil-cocoa butter (PREPARATION H) 0.25-3-85.5 % suppository Place 1 suppository rectally as needed for hemorrhoids.      sildenafil (REVATIO) 20 MG tablet Take 1 tablet (20 mg total) by mouth daily as needed (2-5 as needed). (Patient taking differently: Take 20-100 mg by mouth daily as needed (ED).) 150 tablet 1   Vitamin D, Cholecalciferol, 10 MCG (400 UNIT) CAPS Take 400 Units by mouth daily. 100 capsule 12   ZANTAC 360 MAX ST 20 MG tablet TAKE 1 TABLET BY MOUTH TWICE A DAY 180 tablet 0   No current facility-administered medications on file prior to visit.    Allergies  Allergen Reactions   Uloric [Febuxostat] Palpitations, Other (See Comments) and Hypertension    Hypertension with palpitations and a sense of fuzziness and dizziness at the left side of his head   Doxazosin Diarrhea   Doxycycline Diarrhea   Omeprazole Other (See Comments)   Pantoprazole Other (See Comments)   Augmentin [Amoxicillin-Pot Clavulanate] Diarrhea   Ciprofloxacin Diarrhea   Codeine Nausea And Vomiting and Other (See Comments)    Severe headache   Levaquin [Levofloxacin] Diarrhea   Oxycodone Nausea And Vomiting   Tramadol Nausea And Vomiting   Vicodin [Hydrocodone-Acetaminophen] Nausea And Vomiting    Assessment/Plan:  1. Hyperlipidemia - LDL 105 at baseline above goal < 70 due to hx of ASCVD. Pt is intolerant to 3 statins including lowest starting dose of rosuvastatin. Dicussed PCSK9i therapy which pt is agreeable to trying. Reviewed injection technique, efficacy, and safety data with Repatha which is preferred PCSK9i on his formulary. Prior authorization submitted, pt plans to have labs checked at PCP office in a few months as this is more convenient for him. He is ok with $90/3 month supply copay.  Hermilo Dutter E. Velera Lansdale, PharmD, BCACP, [facility] [address] [place], [place] [address] Phone: [phone]; Fax: [phone] 01/05/2021 3:00 PM

## 2021-01-05 NOTE — Patient Instructions (Signed)
It was nice to meet you today  Your LDL cholesterol is 105 and your goal is < 70  Start Repatha injections once every 14 days in the fatty tissue of your stomach. This will lower your LDL cholesterol by 60% and will help to reduce your risk of a heart attack or a stroke. The copay should be $90 for a 3 month supply. Store the medication in the fridge  Have your primary care doctor recheck your cholesterol in at least 2 months

## 2021-01-06 DIAGNOSIS — T466X5A Adverse effect of antihyperlipidemic and antiarteriosclerotic drugs, initial encounter: Secondary | ICD-10-CM | POA: Insufficient documentation

## 2021-01-06 DIAGNOSIS — G720 Drug-induced myopathy: Secondary | ICD-10-CM | POA: Insufficient documentation

## 2021-01-06 MED ORDER — REPATHA SURECLICK 140 MG/ML ~~LOC~~ SOAJ: 1.0000 "pen " | SUBCUTANEOUS | 3 refills | Status: DC

## 2021-01-06 NOTE — Addendum Note (Signed)
Addended by: Givanni Staron E on: 01/06/2021 07:20 AM   Modules accepted: Orders

## 2021-01-08 ENCOUNTER — Encounter: Payer: Self-pay | Admitting: Family Medicine

## 2021-01-08 ENCOUNTER — Ambulatory Visit (INDEPENDENT_AMBULATORY_CARE_PROVIDER_SITE_OTHER): Payer: PPO | Admitting: Family Medicine

## 2021-01-08 ENCOUNTER — Other Ambulatory Visit: Payer: Self-pay

## 2021-01-08 ENCOUNTER — Telehealth: Payer: Self-pay | Admitting: Internal Medicine

## 2021-01-08 VITALS — BP 100/54 | HR 65 | Temp 97.4°F | Ht 71.0 in | Wt 195.4 lb

## 2021-01-08 DIAGNOSIS — R42 Dizziness and giddiness: Secondary | ICD-10-CM

## 2021-01-08 MED ORDER — ESOMEPRAZOLE MAGNESIUM 40 MG PO CPDR
40.0000 mg | DELAYED_RELEASE_CAPSULE | Freq: Every day | ORAL | 5 refills | Status: DC
Start: 2021-01-08 — End: 2021-01-23

## 2021-01-08 NOTE — Telephone Encounter (Signed)
Pt c/o medication issue:  1. Name of Medication:  atenolol (TENORMIN) 25 MG tablet  2. How are you currently taking this medication (dosage and times per day)? 1/2 a tablet once  a day. Only takes two daily if heart is skipping. Hasn't had to take 2 in several months   3. Are you having a reaction (difficulty breathing--STAT)? Yes   4. What is your medication issue? Dizziness.    STAT if patient feels like he/she is going to faint   Are you dizzy now? Yes  Do you feel faint or have you passed out? No   Do you have any other symptoms? No   Have you checked your HR and BP (record if available)? States it is fine.    States this has been going on for years, but was just able to pin point it is this medication. Requesting to speak with [name] in regards to it. Please advise.

## 2021-01-08 NOTE — Telephone Encounter (Signed)
Pt would really like to further discuss w/ Caren Hazy, RN.  He reports that he thinks it is Atenolol or stomach medication causing  his dizziness.   He takes 1/2 tab Atenolol (12.5 mg) every morning.  Takes another half tab PRN if needed for skipped beats.  He sees his PCP this afternoon. He is currently taking his Atenolol and Prevacid at night. Aware [name] will follow up with him when she returns to the office. Pt agreeable to plan.

## 2021-01-08 NOTE — Progress Notes (Signed)
Subjective:  Patient ID: Ronald Russell., male    DOB: 09-06-1934  Age: 86 y.o. MRN: [id number]  CC: Medication Reaction   HPI [name] presents for swimmy headed feeling. Lasts all day. Stopped the prevacid and it went away. Resumed 5 days later and swimmy headed feeling came back.   Depression screen [facility] 2/9 01/08/2021 12/08/2020 12/03/2020  Decreased Interest 0 0 0  Down, Depressed, Hopeless 0 0 0  PHQ - 2 Score 0 0 0  Altered sleeping - - -  Tired, decreased energy - - -  Change in appetite - - -  Feeling bad or failure about yourself  - - -  Trouble concentrating - - -  Moving slowly or fidgety/restless - - -  Suicidal thoughts - - -  PHQ-9 Score - - -  Difficult doing work/chores - - -  Some recent data might be hidden    History Melbert has a past medical history of Allergy, Anxiety, Bladder cancer ([place]) (2010), CAD (coronary artery disease), Chronic bronchitis ([place]), Dysrhythmia, GERD (gastroesophageal reflux disease), History of blood transfusion (1949), HOH (hard of hearing), Hypertension, Ocular migraine, Pneumonia, Seasonal allergies, Shingles, Temporal arteritis ([place]), and Thrombocytopenia ([place]) (11/29/2016).   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   His family history includes Alzheimer's disease in his father; Cancer in his mother, sister, and sister.He reports that he has quit smoking. His smoking use included cigarettes. He has a 2.25 pack-year smoking history. He has never used  smokeless tobacco. He reports that he does not drink alcohol and does not use drugs.    ROS Review of Systems  Constitutional:  Negative for fever.  Respiratory:  Negative for shortness of breath.   Cardiovascular:  Negative for chest pain.  Musculoskeletal:  Negative for arthralgias.  Skin:  Negative for rash.   Objective:  BP (!) 100/54   Pulse 65   Temp (!) 97.4 F (36.3 C)   Ht 5\' 11"  (1.803 m)   Wt 195 lb 6.4 oz (88.6 kg)   SpO2 95%   BMI 27.25 kg/m   BP Readings from Last 3 Encounters:  01/08/21 (!) 100/54  12/08/20 125/62  10/21/20 (!) 114/56    Wt Readings from Last 3 Encounters:  01/08/21 195 lb 6.4 oz (88.6 kg)  12/08/20 195 lb (88.5 kg)  12/03/20 201 lb (91.2 kg)     Physical Exam Vitals reviewed.  Constitutional:      Appearance: He is well-developed.  HENT:     Head: Normocephalic and atraumatic.     Right Ear: External ear normal.     Left Ear: External ear normal.     Mouth/Throat:     Pharynx: No oropharyngeal exudate or posterior oropharyngeal erythema.  Eyes:     Pupils: Pupils are equal, round, and reactive to light.  Cardiovascular:     Rate and Rhythm: Normal rate and regular rhythm.     Heart sounds: No murmur heard. Pulmonary:     Effort: No respiratory distress.     Breath sounds: Normal breath sounds.  Musculoskeletal:     Cervical back: Normal range of  motion and neck supple.  Neurological:     Mental Status: He is alert and oriented to person, place, and time.      Assessment & Plan:   [name] was seen today for medication reaction.  Diagnoses and all orders for this visit:  Dizziness  Other orders -     esomeprazole (NEXIUM) 40 MG capsule; Take 1 capsule (40 mg total) by mouth daily.      I am having [name]. [name] "RON" start on esomeprazole. I am also having him maintain his aspirin EC, Potassium Gluconate, acetaminophen, Vitamin D (Cholecalciferol), fluticasone, sildenafil, acidophilus, hydroxypropyl  methylcellulose / hypromellose, shark liver oil-cocoa butter, Cyanocobalamin (VITAMIN B-12 PO), atenolol, neomycin-polymyxin-hydrocortisone, lansoprazole, promethazine, Zantac 360 Max St, and [employer].  Allergies as of 01/08/2021       Reactions   Uloric [febuxostat] Palpitations, Other (See Comments), Hypertension   Hypertension with palpitations and a sense of fuzziness and dizziness at the left side of his head   Doxazosin Diarrhea   Doxycycline Diarrhea   Omeprazole Other (See Comments)   Pantoprazole Other (See Comments)   Augmentin [amoxicillin-pot Clavulanate] Diarrhea   Ciprofloxacin Diarrhea   Codeine Nausea And Vomiting, Other (See Comments)   Severe headache   Levaquin [levofloxacin] Diarrhea   Oxycodone Nausea And Vomiting   Tramadol Nausea And Vomiting   Vicodin [hydrocodone-acetaminophen] Nausea And Vomiting        Medication List        Accurate as of January 08, 2021 11:59 PM. If you have any questions, ask your nurse or doctor.          acetaminophen 650 MG CR tablet Commonly known as: TYLENOL Take 650 mg by mouth every 6 (six) hours as needed for pain.   acidophilus Caps capsule Take 1 capsule by mouth daily.   aspirin EC 81 MG tablet Take 1 tablet (81 mg total) by mouth daily.   atenolol 25 MG tablet Commonly known as: TENORMIN TAKE 1/2 TABLET BY MOUTH TWICE A DAY   esomeprazole 40 MG capsule Commonly known as: NexIUM Take 1 capsule (40 mg total) by mouth daily. Started by: Claretta Fraise, MD   fluticasone 50 MCG/ACT nasal spray Commonly known as: FLONASE Place 2 sprays into both nostrils daily.   hydroxypropyl methylcellulose / hypromellose 2.5 % ophthalmic solution Commonly known as: ISOPTO TEARS / GONIOVISC Place 1 drop into both eyes 3 (three) times daily as needed for dry eyes.   lansoprazole 15 MG capsule Commonly known as: PREVACID Take 15 mg by mouth daily at 12 noon.   neomycin-polymyxin-hydrocortisone 3.5-10000-1 OTIC  suspension Commonly known as: CORTISPORIN PLACE 4-5 DROPS IN RIGHT EAR TWICE A DAY AS NEEDED FOR DRAINGE   Potassium Gluconate 550 MG Tabs Take 1 tablet (550 mg total) by mouth at bedtime. Need to take 1 tab one day and 2 the next day alternating   promethazine 12.5 MG tablet Commonly known as: PHENERGAN Take 1 tablet (12.5 mg total) by mouth every 8 (eight) hours as needed for nausea or vomiting.   Repatha SureClick 846 MG/ML [place] Generic drug: Evolocumab Inject 1 pen into the skin every 14 (fourteen) days.   shark liver oil-cocoa butter 0.25-3-85.5 % suppository Commonly known as: PREPARATION H Place 1 suppository rectally as needed for hemorrhoids.   sildenafil 20 MG tablet Commonly known as: REVATIO Take 1 tablet (20 mg total) by mouth daily as needed (2-5 as needed). What changed:  how much to take reasons to take this  VITAMIN B-12 PO Take by mouth.   Vitamin D (Cholecalciferol) 10 MCG (400 UNIT) Caps Take 400 Units by mouth daily.   Zantac 360 Max St 20 MG tablet Generic drug: famotidine TAKE 1 TABLET BY MOUTH TWICE A DAY         Follow-up: No follow-ups on file.  Claretta Fraise, M.D.

## 2021-01-08 NOTE — Telephone Encounter (Signed)
Pt calling today to report continued issues with dizziness. He states this has been going on for "months." He states he thought it was related to his atenolol and PPI interacting with each other. He has cut down to 1/2 tab (12.5mg ) of atenolol every evening. He thinks that has helped a little. He also has tried taking pepcid vs his PPI with some minimal relief. He is going to his GI doctor today to discuss his GI sx as well.   He would like to know if Dr. Harrington Challenger has any suggestions regarding his symptoms. Should he continue taking the atenolol or is there an alternative medication that would help relieve his symptoms.   I advised him I would forward to her and her RN for review and recommendation.

## 2021-01-12 ENCOUNTER — Encounter: Payer: Self-pay | Admitting: Family Medicine

## 2021-01-13 ENCOUNTER — Encounter: Payer: Self-pay | Admitting: Internal Medicine

## 2021-01-13 NOTE — Telephone Encounter (Signed)
Error. See previous encounter.  °

## 2021-01-13 NOTE — Telephone Encounter (Signed)
Patient called again and requested to speak to [facility] regarding his symptoms

## 2021-01-14 NOTE — Telephone Encounter (Signed)
Since first Repatha injection 01/07/21 he is so swimmy-headed he can hardly walk now "i'm like a drunk man".  It has been really bad the last 3-4 days.    When its time for the next dose of Repatha, he does not want to take it.  He thought it was the prevacid and so he went to medical doctor who changed it Nexium 40 mg on 7/28.  He only took one of those and no more because it got so bad.    He also thinks it could be the atenolol  he has changed the times that he takes it and notices as it wears off he feels better but if he misses any his heart starts skipping.    I asked him about his ear infections because he tells me on most of our calls about his chronic ear problems.   I asked him to contact his ENT or PCP to have his ear rechecked. He recently had antibiotics orally and drops for the right ear.    Recent BP/HR  139/70, HR 58 123/65, 57 100/59 52   I have scheduled him with Dr. Harrington Challenger and he is aware I am forwarding this to her and to PharmD for input in the meantime and that we will call him if there are any new recommendations.

## 2021-01-15 NOTE — Telephone Encounter (Signed)
Patient is following up, requesting to speak with RN again. He states he has an update for her.

## 2021-01-15 NOTE — Telephone Encounter (Signed)
Ok to skip 1-2 injections and see if any improvement. Would suggest re-challenge at some point. Agree he should see ENT

## 2021-01-15 NOTE — Telephone Encounter (Signed)
Patient has been notified.  He will do so.  He believes the injection interfered w his heart.  His BP is a little higher than normal and he had some skips and the last time it skipped bad was after the covid booster shot.  It took a couple months for it to settle down.   The only meds he is taking currently is prevacid and atenolol, potassium and vit D.  Adv of her agreement to see ENT also.  He cannot get appt until September.

## 2021-01-16 NOTE — Telephone Encounter (Signed)
Pt wanted to updated on how he is doing. He reports not an ear problem.  Feels like a band around his head.  Has a burning feeling behind nose and mouth.  Yesterday seeing red flashes out of the corner of left eye. He rested through this.  He used to have ocular migraines but has not for years.   BP 134/75, 55 this am.  Feels better today. Still staggering.  And all through the night if he gets up to use bathroom or roll over in bed he could feel the skips.   He still thinks that this is all caused from the [place] and it may get better as the medication gets out of his system.  Much discussion again about ear infections, swimmy headedness, prevacid and covid injections.      He does not wish to see anyone but Dr. Harrington Challenger.  Again adv no sooner appointment before 01/27/21.

## 2021-01-19 ENCOUNTER — Other Ambulatory Visit: Payer: Self-pay | Admitting: Family Medicine

## 2021-01-21 ENCOUNTER — Emergency Department (HOSPITAL_COMMUNITY): Payer: PPO

## 2021-01-21 ENCOUNTER — Telehealth: Payer: Self-pay | Admitting: Internal Medicine

## 2021-01-21 ENCOUNTER — Encounter (HOSPITAL_COMMUNITY): Payer: Self-pay

## 2021-01-21 ENCOUNTER — Ambulatory Visit: Payer: PPO | Admitting: Family Medicine

## 2021-01-21 ENCOUNTER — Observation Stay (HOSPITAL_COMMUNITY)
Admission: EM | Admit: 2021-01-21 | Discharge: 2021-01-23 | Disposition: A | Discharge status: Home / Self Care | Payer: PPO | Attending: Internal Medicine | Admitting: Internal Medicine

## 2021-01-21 DIAGNOSIS — Z7982 Long term (current) use of aspirin: Secondary | ICD-10-CM | POA: Insufficient documentation

## 2021-01-21 DIAGNOSIS — R079 Chest pain, unspecified: Secondary | ICD-10-CM | POA: Diagnosis not present

## 2021-01-21 DIAGNOSIS — I10 Essential (primary) hypertension: Secondary | ICD-10-CM | POA: Insufficient documentation

## 2021-01-21 DIAGNOSIS — Z79899 Other long term (current) drug therapy: Secondary | ICD-10-CM | POA: Diagnosis not present

## 2021-01-21 DIAGNOSIS — Z951 Presence of aortocoronary bypass graft: Secondary | ICD-10-CM | POA: Diagnosis not present

## 2021-01-21 DIAGNOSIS — R0902 Hypoxemia: Secondary | ICD-10-CM | POA: Diagnosis not present

## 2021-01-21 DIAGNOSIS — Z20822 Contact with and (suspected) exposure to covid-19: Secondary | ICD-10-CM | POA: Diagnosis not present

## 2021-01-21 DIAGNOSIS — R0789 Other chest pain: Secondary | ICD-10-CM | POA: Diagnosis not present

## 2021-01-21 DIAGNOSIS — H8110 Benign paroxysmal vertigo, unspecified ear: Secondary | ICD-10-CM | POA: Diagnosis present

## 2021-01-21 DIAGNOSIS — Z8551 Personal history of malignant neoplasm of bladder: Secondary | ICD-10-CM | POA: Insufficient documentation

## 2021-01-21 DIAGNOSIS — I4510 Unspecified right bundle-branch block: Secondary | ICD-10-CM | POA: Diagnosis not present

## 2021-01-21 DIAGNOSIS — Z87891 Personal history of nicotine dependence: Secondary | ICD-10-CM | POA: Diagnosis not present

## 2021-01-21 DIAGNOSIS — I209 Angina pectoris, unspecified: Secondary | ICD-10-CM | POA: Diagnosis present

## 2021-01-21 DIAGNOSIS — R231 Pallor: Secondary | ICD-10-CM | POA: Diagnosis not present

## 2021-01-21 DIAGNOSIS — D696 Thrombocytopenia, unspecified: Secondary | ICD-10-CM | POA: Diagnosis present

## 2021-01-21 DIAGNOSIS — K449 Diaphragmatic hernia without obstruction or gangrene: Secondary | ICD-10-CM | POA: Diagnosis not present

## 2021-01-21 DIAGNOSIS — R072 Precordial pain: Principal | ICD-10-CM | POA: Insufficient documentation

## 2021-01-21 DIAGNOSIS — I2510 Atherosclerotic heart disease of native coronary artery without angina pectoris: Secondary | ICD-10-CM | POA: Insufficient documentation

## 2021-01-21 DIAGNOSIS — R0602 Shortness of breath: Secondary | ICD-10-CM | POA: Diagnosis not present

## 2021-01-21 LAB — COMPREHENSIVE METABOLIC PANEL
ALT: 16 U/L (ref 0–44)
AST: 21 U/L (ref 15–41)
Albumin: 3.3 g/dL — ABNORMAL LOW (ref 3.5–5.0)
Alkaline Phosphatase: 41 U/L (ref 38–126)
Anion gap: 7 (ref 5–15)
BUN: 16 mg/dL (ref 8–23)
CO2: 20 mmol/L — ABNORMAL LOW (ref 22–32)
Calcium: 8.6 mg/dL — ABNORMAL LOW (ref 8.9–10.3)
Chloride: 110 mmol/L (ref 98–111)
Creatinine, Ser: 1.22 mg/dL (ref 0.61–1.24)
GFR, Estimated: 58 mL/min — ABNORMAL LOW (ref >60)
Glucose, Bld: 121 mg/dL — ABNORMAL HIGH (ref 70–99)
Potassium: 3.6 mmol/L (ref 3.5–5.1)
Sodium: 137 mmol/L (ref 135–145)
Total Bilirubin: 0.7 mg/dL (ref 0.3–1.2)
Total Protein: 5.7 g/dL — ABNORMAL LOW (ref 6.5–8.1)

## 2021-01-21 LAB — CBC WITH DIFFERENTIAL/PLATELET
Abs Immature Granulocytes: 0.03 10*3/uL (ref 0.00–0.07)
Basophils Absolute: 0.1 10*3/uL (ref 0.0–0.1)
Basophils Relative: 1 %
Eosinophils Absolute: 0.3 10*3/uL (ref 0.0–0.5)
Eosinophils Relative: 4 %
HCT: 41.7 % (ref 39.0–52.0)
Hemoglobin: 13.6 g/dL (ref 13.0–17.0)
Immature Granulocytes: 0 %
Lymphocytes Relative: 11 %
Lymphs Abs: 0.9 10*3/uL (ref 0.7–4.0)
MCH: 29.6 pg (ref 26.0–34.0)
MCHC: 32.6 g/dL (ref 30.0–36.0)
MCV: 90.7 fL (ref 80.0–100.0)
Monocytes Absolute: 0.7 10*3/uL (ref 0.1–1.0)
Monocytes Relative: 8 %
Neutro Abs: 6.6 10*3/uL (ref 1.7–7.7)
Neutrophils Relative %: 76 %
Platelets: 126 10*3/uL — ABNORMAL LOW (ref 150–400)
RBC: 4.6 MIL/uL (ref 4.22–5.81)
RDW: 15.3 % (ref 11.5–15.5)
WBC: 8.6 10*3/uL (ref 4.0–10.5)
nRBC: 0 % (ref 0.0–0.2)

## 2021-01-21 LAB — TROPONIN I (HIGH SENSITIVITY)
Troponin I (High Sensitivity): 8 ng/L (ref <18)
Troponin I (High Sensitivity): 9 ng/L (ref <18)
Troponin I (High Sensitivity): 8 ng/L (ref <18)

## 2021-01-21 MED ORDER — ASPIRIN EC 81 MG PO TBEC
81.0000 mg | DELAYED_RELEASE_TABLET | Freq: Every day | ORAL | Status: DC
  Administered 2021-01-21 – 2021-01-23 (×3): 81 mg via ORAL
  Filled 2021-01-21 (×3): qty 1

## 2021-01-21 MED ORDER — POTASSIUM GLUCONATE 550 MG PO TABS: 550.0000 mg | ORAL_TABLET | Freq: Every day | ORAL | Status: DC

## 2021-01-21 MED ORDER — ATENOLOL 25 MG PO TABS
12.5000 mg | ORAL_TABLET | Freq: Two times a day (BID) | ORAL | Status: DC
  Administered 2021-01-22: 12.5 mg via ORAL
  Filled 2021-01-21 (×2): qty 1

## 2021-01-21 MED ORDER — ACETAMINOPHEN 650 MG RE SUPP: 650.0000 mg | Freq: Four times a day (QID) | RECTAL | Status: DC | PRN

## 2021-01-21 MED ORDER — SODIUM CHLORIDE 0.9% FLUSH: 3.0000 mL | INTRAVENOUS | Status: DC | PRN

## 2021-01-21 MED ORDER — SODIUM CHLORIDE 0.9 % IV SOLN: 250.0000 mL | INTRAVENOUS | Status: DC | PRN

## 2021-01-21 MED ORDER — PROMETHAZINE HCL 25 MG PO TABS: 12.5000 mg | ORAL_TABLET | Freq: Three times a day (TID) | ORAL | Status: DC | PRN

## 2021-01-21 MED ORDER — SODIUM CHLORIDE 0.9% FLUSH
3.0000 mL | Freq: Two times a day (BID) | INTRAVENOUS | Status: DC
  Administered 2021-01-21 – 2021-01-23 (×4): 3 mL via INTRAVENOUS

## 2021-01-21 MED ORDER — ACETAMINOPHEN 325 MG PO TABS: 650.0000 mg | ORAL_TABLET | Freq: Four times a day (QID) | ORAL | Status: DC | PRN

## 2021-01-21 MED ORDER — POTASSIUM CHLORIDE CRYS ER 10 MEQ PO TBCR
10.0000 meq | EXTENDED_RELEASE_TABLET | Freq: Every day | ORAL | Status: DC
  Administered 2021-01-22 – 2021-01-23 (×2): 10 meq via ORAL
  Filled 2021-01-21 (×2): qty 1

## 2021-01-21 NOTE — Telephone Encounter (Signed)
I spoke with pt in regards to CP.  He reports that about 20 min ago he had CP that felt as if someone was sitting on his chest for 3 minutes.  CP was so bad he wanted to cry.  BP 175/105 he then took scheduled atenolol and feels really "swimmy headed".  He denies N/V, pain to left arm/ neck.  He does feel dizzy.  Last BP 137/77-57 he currently feels better and wants to know what he should do now.  I advised him to go to the ED for evaluation.  He expressed that he would prefer to come to our office.  I explained to him that if he is having active CP the hospital is the best place for him to be.  The hospital has access to testing and resources that our office does not have.  Pt expressed that he will go to the ED for evaluation.

## 2021-01-21 NOTE — Telephone Encounter (Signed)
Pt c/o of Chest Pain: STAT if CP now or developed within 24 hours  1. Are you having CP right now? no  2. Are you experiencing any other symptoms (ex. SOB, nausea, vomiting, sweating)? Was shaking, high BP, got "swimmy headed" from his atenolol that he took, disoriented now  3. How long have you been experiencing CP? Started today around 20 minutes ago  4. Is your CP continuous or coming and going? Came and went  5. Have you taken Nitroglycerin? Took atenolol ?   Patient states he was told not to take his atenolol, because he has been getting "swimmy headed" from it. He states today about 20 minutes ago his BP went up to 175/105 and he started shaking. He states he also started having chest pressure. He states it did go away once he took the atenolol again and his BP went down to 137/77/58. He states he is now "swimmy headed" again from taking the medication. He states he can hardly walk.

## 2021-01-21 NOTE — ED Notes (Signed)
Pt reports no pain but reports still having some palpitations
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
## 2021-01-21 NOTE — ED Notes (Signed)
Pt refused blood draw viva venipuncture.
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
## 2021-01-21 NOTE — ED Provider Notes (Signed)
[facility] EMERGENCY DEPARTMENT Provider Note   CSN: [id number] Arrival date & time: 01/21/21  1549     History Chief Complaint  Patient presents with   Chest Pain    Ronald DIIORIO Sr. is a 86 y.o. male.  Patient is an 86 year old male with a history of CAD status post CABG in 2019, GERD, hypertension, temporal arteritis, prior bladder cancer who is presenting today with complaint of chest pain.  Patient reports for some time now he has had ongoing issues with feeling" swimmy headed".  He states it is present all day long but much worse when he is up and trying to walk.  He reports that his vision seems like it is going dark and he has to walk with his head down.  He has been following up with his PCP and his doctor at the [place] and had an MRI even 2 months ago to evaluate this which showed no acute findings.  He thinks it is related to his atenolol and Prevacid and had recently stopped taking the Prevacid but continued to take the atenolol.  However yesterday after speaking with his doctor they told him he could try stopping the atenolol.  He had not taken any today and felt like the swimmy headed sensation was slightly improved but then around lunchtime he checked his blood pressure and it was elevated at 170/105.  He also reported he just started feeling very bad.  He described crushing chest pain that made him feel short of breath, sweaty and he felt like he was going to die.  He took a half of his atenolol but still was not feeling any better so went to the fire station because he did not think he could make it to the emergency room.  At the fire station they loaded him into the truck and they gave him 1 nitroglycerin which she reported made all of his pain go away.  Patient reports that he has not had pain like that and as long as he can remember.  He did feel like his heart was skipping some beats but now reports everything has resolved.  He is not recently been ill.  He has  been eating well.  No vomiting or diarrhea.  He has not been doing any activity where he would have become overheated.  The history is provided by the patient and the spouse.  Chest Pain     Past Medical History:  Diagnosis Date   Allergy    Anxiety    Bladder cancer ([place]) 2010   Tx with BCG   CAD (coronary artery disease)    LHC 6/19: pLAD 75, mLAD 100, D2 75; pLCx 64, mLCx 70, EF 55-65 >> s/p CABG // Echo 3/18: EF 60-65, Gr 2 DD   Chronic bronchitis ([place])    "yearly the last 3 yrs" (02/13/2013)   Dysrhythmia    GERD (gastroesophageal reflux disease)    History of blood transfusion 1949   "seeral" (02/13/2013)   HOH (hard of hearing)    Hypertension    Ocular migraine    "not often" (02/13/2013)   Pneumonia    "last time ~ 2 yr ago; had it before that too" (02/13/2013)   Seasonal allergies    Shingles    has neuropathy since it happened in 6/17   Temporal arteritis (HCC)    Thrombocytopenia ([place]) 11/29/2016    Patient Active Problem List   Diagnosis Date Noted   Statin myopathy 01/06/2021  Allergic rhinitis 12/03/2020   Bladder cancer ([place]) 12/03/2020   Family history of malignant neoplasm of gastrointestinal tract 12/03/2020   Hypertrophy of prostate without urinary obstruction and other lower urinary tract symptoms (LUTS) 12/03/2020   Mixed conductive and sensorineural hearing loss, unilateral, right ear with restricted hearing on the contralateral side 12/03/2020   New daily persistent headache (ndph) 12/03/2020   Encounter for immunization 12/03/2020   Personal history of colonic polyps 12/03/2020   Psychosexual dysfunction with inhibited sexual excitement 12/03/2020   History of bladder cancer 07/20/2019   S/P CABG x 3 12/07/2017   CAD (coronary artery disease)    Sebaceous cyst 09/20/2017   GERD without esophagitis 04/13/2017   Postoperative right upper quadrant abdominal pain 12/10/2016   S/P laparoscopic cholecystectomy 12/08/2016   History of right mastoidectomy  11/30/2016   Thrombocytopenia ([place]) 11/29/2016   Palpitations 09/01/2016   Renal insufficiency 09/01/2016   Hyperlipidemia 09/01/2016   Localized swelling of lower extremity 09/01/2016   Gout 05/20/2016   Post herpetic neuralgia 05/20/2016   GAD (generalized anxiety disorder) 01/27/2016   Vitamin D deficiency 11/06/2015   BPPV (benign paroxysmal positional vertigo) 09/30/2015   Immune deficiency disorder ([place]) 08/19/2015   HTN (hypertension), benign 04/18/2013   Temporal arteritis ([place]) 04/18/2013   Anxiety 04/18/2013   HOH (hard of hearing)    Vertigo 07/31/2011   Symptomatic PVCs 07/31/2011    Past Surgical History:  Procedure Laterality Date   ARTERY BIOPSY Left 01/09/2013   Procedure: BIOPSY TEMPORAL ARTERY;  Surgeon: Rozetta Nunnery, MD;  Location: [place];  Service: ENT;  Laterality: Left;   CARDIOVASCULAR STRESS TEST  07/2011   No evidence of ischemia; EF 79%   CHOLECYSTECTOMY N/A 11/30/2016   Procedure: LAPAROSCOPIC CHOLECYSTECTOMY;  Surgeon: Aviva Signs, MD;  Location: [facility];  Service: General;  Laterality: N/A;   COLONOSCOPY     CORONARY ARTERY BYPASS GRAFT N/A 12/07/2017   Procedure: CORONARY ARTERY BYPASS GRAFTING (CABG) times three using left internal mammary artery and left endoscopically harvested saphenous vein graft;  Surgeon: Melrose Nakayama, MD;  Location: [place];  Service: Open Heart Surgery;  Laterality: N/A;   CYSTOSCOPY WITH BIOPSY N/A 04/21/2020   Procedure: CYSTOSCOPY WITH BLADDER BIOPSY/ FULGURATION;  Surgeon: Raynelle Bring, MD;  Location: [facility];  Service: Urology;  Laterality: N/A;  ONLY NEEDS 45 MIN   LEFT HEART CATH AND CORONARY ANGIOGRAPHY N/A 12/02/2017   Procedure: LEFT HEART CATH AND CORONARY ANGIOGRAPHY;  Surgeon: Jettie Booze, MD;  Location: [facility];  Service: Cardiovascular;  Laterality: N/A;   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[place]    lower lower leg burn; "probably 4-5 ORs in  1949 for this" (02/13/2013)   SKIN GRAFT Right    upper and lower leg   TEE WITHOUT CARDIOVERSION N/A 12/07/2017   Procedure: TRANSESOPHAGEAL ECHOCARDIOGRAM (TEE);  Surgeon: Melrose Nakayama, MD;  Location: [place];  Service: Open Heart Surgery;  Laterality: N/A;   TONSILLECTOMY  1940's   TRANSURETHRAL RESECTION OF BLADDER TUMOR  2010 X 3   "cancer" (02/13/2013)   VASECTOMY         Family History  Problem Relation Age of Onset   Cancer Mother        breast   Alzheimer's disease Father    Cancer Sister        Breast cancer   Cancer Sister        colon cancer   Anemia  Neg Hx    Arrhythmia Neg Hx    Asthma Neg Hx    Clotting disorder Neg Hx    Fainting Neg Hx    Heart attack Neg Hx    Heart disease Neg Hx    Heart failure Neg Hx    Hyperlipidemia Neg Hx    Hypertension Neg Hx    Migraines Neg Hx     Social History   Tobacco Use   Smoking status: Former    Packs/day: 0.75    Years: 3.00    Pack years: 2.25    Types: Cigarettes   Smokeless tobacco: Never   Tobacco comments:    02/13/2013 "quit smoking age 4"  Vaping Use   Vaping Use: Never used  Substance Use Topics   Alcohol use: No    Comment: 02/13/2013 "probably a pint of whiskey/wk up til I was probably 86 yr old"   Drug use: No    Home Medications Prior to Admission medications   Medication Sig Start Date End Date Taking? Authorizing Provider  acetaminophen (TYLENOL) 650 MG CR tablet Take 650 mg by mouth every 6 (six) hours as needed for pain.    [provider]  acidophilus (RISAQUAD) CAPS capsule Take 1 capsule by mouth daily.    [provider]  aspirin EC 81 MG tablet Take 1 tablet (81 mg total) by mouth daily. 11/25/17   Jettie Booze, MD  atenolol (TENORMIN) 25 MG tablet TAKE 1/2 TABLET BY MOUTH TWICE A DAY 08/01/20   Fay Records, MD  Cyanocobalamin (VITAMIN B-12 PO) Take by mouth.    [provider]  esomeprazole (NEXIUM) 40 MG capsule Take 1 capsule (40 mg total) by  mouth daily. 01/08/21   Claretta Fraise, MD  Evolocumab (REPATHA SURECLICK) 536 MG/ML SOAJ Inject 1 pen into the skin every 14 (fourteen) days. Patient not taking: Reported on 01/15/2021 01/06/21   Fay Records, MD  fluticasone [facility]) 50 MCG/ACT nasal spray Place 2 sprays into both nostrils daily. 07/20/19   Baruch Gouty, FNP  hydroxypropyl methylcellulose / hypromellose (ISOPTO TEARS / GONIOVISC) 2.5 % ophthalmic solution Place 1 drop into both eyes 3 (three) times daily as needed for dry eyes.    [provider]  lansoprazole (PREVACID) 15 MG capsule Take 15 mg by mouth daily at 12 noon.    [provider]  neomycin-polymyxin-hydrocortisone (CORTISPORIN) 3.5-10000-1 OTIC suspension PLACE 4-5 DROPS IN RIGHT EAR TWICE A DAY AS NEEDED FOR DRAINGE 08/26/20   Rozetta Nunnery, MD  Potassium Gluconate 550 MG TABS Take 1 tablet (550 mg total) by mouth at bedtime. Need to take 1 tab one day and 2 the next day alternating 12/26/17   Blanchie Dessert, MD  promethazine (PHENERGAN) 12.5 MG tablet Take 1 tablet (12.5 mg total) by mouth every 8 (eight) hours as needed for nausea or vomiting. 12/04/20   Loman Brooklyn, FNP  shark liver oil-cocoa butter (PREPARATION H) 0.25-3-85.5 % suppository Place 1 suppository rectally as needed for hemorrhoids.    [provider]  sildenafil (REVATIO) 20 MG tablet Take 1 tablet (20 mg total) by mouth daily as needed (2-5 as needed). Patient taking differently: Take 20-100 mg by mouth daily as needed (ED). 12/19/19   Claretta Fraise, MD  Vitamin D, Cholecalciferol, 10 MCG (400 UNIT) CAPS Take 400 Units by mouth daily. 07/19/19   Fay Records, MD  ZANTAC 360 MAX ST 20 MG tablet TAKE 1 TABLET BY MOUTH TWICE A DAY  01/02/21   Gwenlyn Perking, FNP    Allergies    Uloric [febuxostat], Doxazosin, Doxycycline, Omeprazole, Pantoprazole, Augmentin [amoxicillin-pot clavulanate], Ciprofloxacin, Codeine, Levaquin [levofloxacin], Oxycodone, Tramadol, and Vicodin  [hydrocodone-acetaminophen]  Review of Systems   Review of Systems  Cardiovascular:  Positive for chest pain.  All other systems reviewed and are negative.  Physical Exam Updated Vital Signs BP 109/65   Pulse (!) 58   Temp 98 F (36.7 C) (Oral)   Resp 18   Ht 5\' 11"  (1.803 m)   Wt 84.8 kg   SpO2 97%   BMI 26.08 kg/m   Physical Exam Vitals and nursing note reviewed.  Constitutional:      General: He is not in acute distress.    Appearance: Normal appearance. He is well-developed and normal weight.  HENT:     Head: Normocephalic and atraumatic.     Mouth/Throat:     Mouth: Mucous membranes are moist.  Eyes:     Conjunctiva/sclera: Conjunctivae normal.     Pupils: Pupils are equal, round, and reactive to light.  Cardiovascular:     Rate and Rhythm: Normal rate and regular rhythm.     Pulses: Normal pulses.     Heart sounds: No murmur heard. Pulmonary:     Effort: Pulmonary effort is normal. No respiratory distress.     Breath sounds: Normal breath sounds. No wheezing or rales.     Comments: Well-healed midline sternotomy scar Abdominal:     General: There is no distension.     Palpations: Abdomen is soft.     Tenderness: There is no abdominal tenderness. There is no guarding or rebound.  Musculoskeletal:        General: No tenderness. Normal range of motion.     Cervical back: Normal range of motion and neck supple.     Right lower leg: No edema.     Left lower leg: No edema.  Skin:    General: Skin is warm and dry.     Capillary Refill: Capillary refill takes less than 2 seconds.     Findings: No erythema or rash.  Neurological:     Mental Status: He is alert and oriented to person, place, and time. Mental status is at baseline.  Psychiatric:        Mood and Affect: Mood normal.        Behavior: Behavior normal.    ED Results / Procedures / Treatments   Labs (all labs ordered are listed, but only abnormal results are displayed) Labs Reviewed  CBC WITH  DIFFERENTIAL/PLATELET - Abnormal; Notable for the following components:      Result Value   Platelets 126 (*)    All other components within normal limits  COMPREHENSIVE METABOLIC PANEL - Abnormal; Notable for the following components:   CO2 20 (*)    Glucose, Bld 121 (*)    Calcium 8.6 (*)    Total Protein 5.7 (*)    Albumin 3.3 (*)    GFR, Estimated 58 (*)    All other components within normal limits  TROPONIN I (HIGH SENSITIVITY)  TROPONIN I (HIGH SENSITIVITY)    EKG EKG Interpretation  Date/Time:  Wednesday January 21 2021 15:59:19 EDT Ventricular Rate:  59 PR Interval:  230 QRS Duration: 135 QT Interval:  434 QTC Calculation: 430 R Axis:   -68 Text Interpretation: Sinus rhythm Prolonged PR interval RBBB and LAFB No significant change since last tracing Confirmed by Blanchie Dessert [phone]) on 01/21/2021 4:11:40 PM  Radiology DG Chest Port 1 View  Result Date: 01/21/2021 CLINICAL DATA:  Shortness of breath and chest pain. EXAM: PORTABLE CHEST 1 VIEW COMPARISON:  Chest radiograph 12/30/2018 FINDINGS: The heart size and mediastinal contours are within normal limits. Aortic calcifications. Postoperative changes of CABG. Sternal wires are aligned and intact. Both lungs are clear. Hiatal hernia, unchanged. The visualized skeletal structures are unremarkable. IMPRESSION: No active cardiopulmonary disease. Aortic Atherosclerosis (ICD10-I70.0). Electronically Signed   By: Ileana Roup MD   On: 01/21/2021 17:01    Procedures Procedures   Medications Ordered in ED Medications - No data to display  ED Course  I have reviewed the triage vital signs and the nursing notes.  Pertinent labs & imaging results that were available during my care of the patient were reviewed by me and considered in my medical decision making (see chart for details).    MDM Rules/Calculators/A&P                           Elderly male with significant heart history status post CABG presenting today with  chest pain concerning for ACS.  He described it as pressure tightness with associated diaphoresis, shortness of breath and some nausea.  He felt at one point that he might pass out but did not lose consciousness.  Patient had an EKG done by paramedics without significant ST changes.  He was given a nitroglycerin and pain has resolved upon my exam.  Patient does complain of symptoms of feeling swimmy headed that has been going on for months and thinks it may be related to his medication.  He does not describe frank vertigo but also does not describe specific syncope either.  It is worse with standing and walking but also can be worse depending on what side he lays on.  He had an MRI in the past to evaluate this 2 months ago which showed no evidence of stroke or mass.  He is mentating normally at this time.  However given his heart history will need a cardiac work-up.  EKG here shows a right bundle branch block but no other acute changes.  8:20 PM Initial EKG, CXR and labs are reassuring but given pt's PMH, episode of pain today and resolution after meds will admit for obs and cardiac r/o.  MDM   Amount and/or Complexity of Data Reviewed Clinical lab tests: ordered and reviewed Tests in the radiology section of CPT: ordered and reviewed Tests in the medicine section of CPT: ordered and reviewed Independent visualization of images, tracings, or specimens: yes     Final Clinical Impression(s) / ED Diagnoses Final diagnoses:  Precordial pain    Rx / DC Orders ED Discharge Orders     None        Blanchie Dessert, MD 01/21/21 2022

## 2021-01-21 NOTE — ED Notes (Signed)
Dr. Tonie Griffith at bedside

## 2021-01-22 ENCOUNTER — Observation Stay (HOSPITAL_BASED_OUTPATIENT_CLINIC_OR_DEPARTMENT_OTHER): Payer: PPO

## 2021-01-22 ENCOUNTER — Other Ambulatory Visit: Payer: Self-pay

## 2021-01-22 DIAGNOSIS — I25118 Atherosclerotic heart disease of native coronary artery with other forms of angina pectoris: Secondary | ICD-10-CM | POA: Diagnosis not present

## 2021-01-22 DIAGNOSIS — R079 Chest pain, unspecified: Secondary | ICD-10-CM | POA: Diagnosis not present

## 2021-01-22 DIAGNOSIS — I10 Essential (primary) hypertension: Secondary | ICD-10-CM

## 2021-01-22 LAB — BASIC METABOLIC PANEL
Anion gap: 9 (ref 5–15)
BUN: 16 mg/dL (ref 8–23)
CO2: 20 mmol/L — ABNORMAL LOW (ref 22–32)
Calcium: 8.9 mg/dL (ref 8.9–10.3)
Chloride: 110 mmol/L (ref 98–111)
Creatinine, Ser: 1.18 mg/dL (ref 0.61–1.24)
GFR, Estimated: >60 mL/min (ref >60)
Glucose, Bld: 109 mg/dL — ABNORMAL HIGH (ref 70–99)
Potassium: 3.9 mmol/L (ref 3.5–5.1)
Sodium: 139 mmol/L (ref 135–145)

## 2021-01-22 LAB — TROPONIN I (HIGH SENSITIVITY): Troponin I (High Sensitivity): 9 ng/L (ref <18)

## 2021-01-22 LAB — SARS CORONAVIRUS 2 (TAT 6-24 HRS): SARS Coronavirus 2: NEGATIVE

## 2021-01-22 MED ORDER — PERFLUTREN LIPID MICROSPHERE
1.0000 mL | INTRAVENOUS | Status: AC | PRN
  Administered 2021-01-22: 2 mL via INTRAVENOUS
  Filled 2021-01-22: qty 10

## 2021-01-22 MED ORDER — PANTOPRAZOLE SODIUM 40 MG PO TBEC: 40.0000 mg | DELAYED_RELEASE_TABLET | Freq: Every day | ORAL | Status: DC

## 2021-01-22 MED ORDER — DILTIAZEM HCL 60 MG PO TABS
30.0000 mg | ORAL_TABLET | Freq: Four times a day (QID) | ORAL | Status: DC | PRN
  Filled 2021-01-22: qty 1

## 2021-01-22 MED ORDER — LANSOPRAZOLE 15 MG PO TBDD
15.0000 mg | DELAYED_RELEASE_TABLET | Freq: Every day | ORAL | Status: DC
  Administered 2021-01-23: 15 mg via ORAL
  Filled 2021-01-22: qty 1

## 2021-01-22 MED ORDER — PANTOPRAZOLE SODIUM 20 MG PO TBEC: 20.0000 mg | DELAYED_RELEASE_TABLET | Freq: Every day | ORAL | Status: DC

## 2021-01-22 NOTE — ED Notes (Signed)
Patient transported to vascular. 

## 2021-01-22 NOTE — Progress Notes (Signed)
PROGRESS NOTE    Ronald Russell   XTG:[id number]  DOB: 05/10/1935  PCP: Claretta Fraise, MD    DOA: 01/21/2021 LOS: 0   Assessment & Plan   Principal Problem:   Chest pain Active Problems:   HTN (hypertension), benign   BPPV (benign paroxysmal positional vertigo)   Thrombocytopenia (HCC)   CAD (coronary artery disease)   S/P CABG x 3   Chest pain - in pt with hx of CAD s/p CABGx3.   Troponin negative and no acute ischemic ECG changes.  Suspect due to his palpitations as the timing of onset correlates.  Stress echo ordered on admission could not be done as patient had received beta blocker. Given prior CAD and CABG, will get Lexiscan tomorrow. NPO after midnight. Continue daily aspirin, PRN SL Nitro. Hold atenolol (see below). Monitor BP's and HR. Telemetry monitoring. Chart reviewed - pt follows with Dr. Harrington Challenger.  He takes atenolol for palpitations.  Prior workup revealed no A-fib, but PAC's/PVC's.  Patient is reporting intolerance to atenolol - he gets dizzy "swimmy headed" and is too unsteady to tolerate ambulation about 1-1.5 hours after taking it.  Wife endorses multiple near-falls. Stopped atenolol for now.  Monitor off it. Will use PRN oral low dose diltiazem for HR>110 or SBP>160 for now (EF preserved on prior echos). He has scheduled appointment with cardiology on 8/16.   Dizziness / Ambulatory dysfunction with multiple near-falls -  Questionable if BPPV - Has been to vestibular rehab in past without improvement. This is a chronic problem.  Patient states symptoms acutely become worse after he takes his atenolol.  Sometimes his BP will be low when this happens, but not consistently, and prior orthostatics normal per pt and wife.  PT evaluation. Orthostatic vitals.   Hx of Palpitations - followed by Dr. Harrington Challenger, as above. Has follow up on 8/16. Plan as above - trial of diltiazem PRN, stopped atenolol for now, monitor.   Hypertension - stop atenolol given apparent  side effects. PRN oral diltiazem for now.  Thrombocytopenia - chronic.  Follow CBC.   Patient BMI: Body mass index is 26.08 kg/m.   DVT prophylaxis: Place TED hose Start: 01/21/21 2122   Diet:  Diet Orders (From admission, onward)     Start     Ordered   01/23/21 0001  Diet NPO time specified  Diet effective midnight        01/22/21 1321   01/21/21 2122  Diet Heart Room service appropriate? Yes; Fluid consistency: Thin  Diet effective now       Question Answer Comment  Room service appropriate? Yes   Fluid consistency: Thin      01/21/21 2121              Code Status: Full Code   Brief Narrative / Hospital Course to Date:   Ronald Russell. is a 86 y.o. male with medical history significant for CAD status post CABG in 2019, GERD, hypertension, temporal arteritis, prior bladder cancer who presents by EMS for evaluation of substernal chest pain with associated SOB and diaphoresis.  This occurred in the setting of elevated BP 170/105 and dizziness.  Admitted for observation and evaluation into the etiology of his chest pain.   Subjective 01/22/21    Pt seen in ED holding for a bed, wife at bedside.  Patient reports that he is having episodes of dizziness and feeling "swimmy headed" about 1 to 1-1/2 hours after taking his atenolol.  He and  wife will check his blood pressure and occasionally will be low but not consistently.  This scares the patient because he is not able to walk or even stand up and wife confirms this she reports multiple near falls because of it.  Patient reports this is the reason he is here.  He denies having any chest pain this morning.    Disposition Plan & Communication   Status is: Observation  The patient remains OBS appropriate and will d/c before 2 midnights.  Dispo: The patient is from: Home              Anticipated d/c is to: Home              Patient currently is not medically stable to d/c.   Difficult to place patient No  Family  Communication: Wife at bedside on rounds   Consults, Procedures, Significant Events   Consultants:  None  Procedures:  Treadmill stress echo unable to be performed due to beta-blocker Lexiscan stress planned for 8/12  Antimicrobials:  Anti-infectives (From admission, onward)    None         Micro    Objective   Vitals:   01/22/21 1000 01/22/21 1115 01/22/21 1135 01/22/21 1315  BP: (!) 141/50 (!) 120/46 (!) 141/76 (!) 151/75  Pulse: 70 (!) 52 (!) 58 (!) 55  Resp: 18 (!) 24 17 16   Temp:      TempSrc:      SpO2: 99% 94% 94% 96%  Weight:      Height:        Intake/Output Summary (Last 24 hours) at 01/22/2021 1621 Last data filed at 01/22/2021 0243 Gross per 24 hour  Intake --  Output 550 ml  Net -550 ml   Filed Weights   01/21/21 1556  Weight: 84.8 kg    Physical Exam:  General exam: awake, alert, no acute distress HEENT: atraumatic, clear conjunctiva, anicteric sclera, moist mucus membranes, hearing grossly normal  Respiratory system: CTAB, no wheezes, rales or rhonchi, normal respiratory effort. Cardiovascular system: normal S1/S2, RRR, no JVD, murmurs, rubs, gallops, no pedal edema.   Gastrointestinal system: soft, NT, ND, no HSM felt, +bowel sounds. Central nervous system: A&O x4. no gross focal neurologic deficits, normal speech Extremities: moves all, no edema, normal tone Skin: dry, intact, normal temperature, normal color Psychiatry: normal mood, congruent affect, judgement and insight appear normal  Labs   Data Reviewed: I have personally reviewed following labs and imaging studies  CBC: Recent Labs  Lab 01/21/21 1733  WBC 8.6  NEUTROABS 6.6  HGB 13.6  HCT 41.7  MCV 90.7  PLT 798*   Basic Metabolic Panel: Recent Labs  Lab 01/21/21 1733 01/21/21 2322  NA 137 139  K 3.6 3.9  CL 110 110  CO2 20* 20*  GLUCOSE 121* 109*  BUN 16 16  CREATININE 1.22 1.18  CALCIUM 8.6* 8.9   GFR: Estimated Creatinine Clearance: 49.6 mL/min (by C-G  formula based on SCr of 1.18 mg/dL). Liver Function Tests: Recent Labs  Lab 01/21/21 1733  AST 21  ALT 16  ALKPHOS 41  BILITOT 0.7  PROT 5.7*  ALBUMIN 3.3*   No results for input(s): LIPASE, AMYLASE in the last 168 hours. No results for input(s): AMMONIA in the last 168 hours. Coagulation Profile: No results for input(s): INR, PROTIME in the last 168 hours. Cardiac Enzymes: No results for input(s): CKTOTAL, CKMB, CKMBINDEX, TROPONINI in the last 168 hours. BNP (last 3 results) No results for  input(s): PROBNP in the last 8760 hours. HbA1C: No results for input(s): HGBA1C in the last 72 hours. CBG: No results for input(s): GLUCAP in the last 168 hours. Lipid Profile: No results for input(s): CHOL, HDL, LDLCALC, TRIG, CHOLHDL, LDLDIRECT in the last 72 hours. Thyroid Function Tests: No results for input(s): TSH, T4TOTAL, FREET4, T3FREE, THYROIDAB in the last 72 hours. Anemia Panel: No results for input(s): VITAMINB12, FOLATE, FERRITIN, TIBC, IRON, RETICCTPCT in the last 72 hours. Sepsis Labs: No results for input(s): PROCALCITON, LATICACIDVEN in the last 168 hours.  Recent Results (from the past 240 hour(s))  SARS CORONAVIRUS 2 (TAT 6-24 HRS) Nasopharyngeal Nasopharyngeal Swab     Status: None   Collection Time: 01/21/21  8:30 PM   Specimen: Nasopharyngeal Swab  Result Value Ref Range Status   SARS Coronavirus 2 NEGATIVE NEGATIVE Final    Comment: (NOTE) SARS-CoV-2 target nucleic acids are NOT DETECTED.  The SARS-CoV-2 RNA is generally detectable in upper and lower respiratory specimens during the acute phase of infection. Negative results do not preclude SARS-CoV-2 infection, do not rule out co-infections with other pathogens, and should not be used as the sole basis for treatment or other patient management decisions. Negative results must be combined with clinical observations, patient history, and epidemiological information. The expected result is Negative.  Fact  Sheet for Patients: SugarRoll.be  Fact Sheet for Healthcare Providers: https://www.woods-mathews.com/  This test is not yet approved or cleared by the [place] FDA and  has been authorized for detection and/or diagnosis of SARS-CoV-2 by FDA under an Emergency Use Authorization (EUA). This EUA will remain  in effect (meaning this test can be used) for the duration of the COVID-19 declaration under Se ction 564(b)(1) of the Act, 21 U.S.C. section 360bbb-3(b)(1), unless the authorization is terminated or revoked sooner.  Performed at [facility], [place] [address], [place], [place] [address]       Imaging Studies   DG Chest Port 1 View  Result Date: 01/21/2021 CLINICAL DATA:  Shortness of breath and chest pain. EXAM: PORTABLE CHEST 1 VIEW COMPARISON:  Chest radiograph 12/30/2018 FINDINGS: The heart size and mediastinal contours are within normal limits. Aortic calcifications. Postoperative changes of CABG. Sternal wires are aligned and intact. Both lungs are clear. Hiatal hernia, unchanged. The visualized skeletal structures are unremarkable. IMPRESSION: No active cardiopulmonary disease. Aortic Atherosclerosis (ICD10-I70.0). Electronically Signed   By: Ileana Roup MD   On: 01/21/2021 17:01   ECHOCARDIOGRAM LIMITED  Result Date: 01/22/2021    ECHOCARDIOGRAM LIMITED REPORT   Patient Name:   LEMMIE VANLANEN Sr. Date of Exam: 01/22/2021 Medical Rec #:  [id number]          Height:       71.0 in Accession #:    [id number]         Weight:       187.0 lb Date of Birth:  13-Sep-1934          BSA:          2.049 m Patient Age:    89 years           BP:           141/76 mmHg Patient Gender: M                  HR:           60 bpm. Exam Location:  Inpatient Procedure: Limited Echo and Intracardiac Opacification Agent Indications:    R07.9* Chest  pain, unspecified  History:        Patient has prior history of Echocardiogram examinations, most                  recent 01/08/2019. CAD, Prior CABG; Risk Factors:Hypertension.                 Thrombocytopenia. GERD. Bladder cancer.  Sonographer:    Darlina Sicilian RDCS Referring Phys: [id number] Eben Burow  Sonographer Comments: Stress Echo cancled, recently recieved a beta blocker. IMPRESSIONS  1. Incomplete stress echocardiogram study as the procedure was cancelled due to inability to achieve THR in the setting of beta blocker use. Conclusion(s)/Recommendation(s): Consider alternate modality such as cardiac CT or myoview perfusion imaging to evaluate for ischemia if warranted. FINDINGS  Left Ventricle: Incomplete stress echocardiogram study as the procedure was cancelled due to inability to achieve THR in the setting of beta blocker use. Definity contrast agent was given IV to delineate the left ventricular endocardial borders. Lyman Bishop MD Electronically signed by Lyman Bishop MD Signature Date/Time: 01/22/2021/1:32:23 PM    Final      Medications   Scheduled Meds:  aspirin EC  81 mg Oral Daily   potassium chloride  10 mEq Oral Daily   sodium chloride flush  3 mL Intravenous Q12H   Continuous Infusions:  sodium chloride         LOS: 0 days    Time spent: 30 minutes    Ezekiel Slocumb, DO [facility]  01/22/2021, 4:21 PM      If 7PM-7AM, please contact night-coverage. How to contact the [facility] Attending or Consulting provider [place] or covering provider during after hours [place], for this patient?    Check the care team in [facility] and look for a) attending/consulting [facility] provider listed and b) the [facility] team listed Log into [email or web address] and use [place]'s universal password to access. If you do not have the password, please contact the hospital operator. Locate the [facility] provider you are looking for under [facility] and page to a number that you can be directly reached. If you still have difficulty reaching the provider, please page the [facility] (Director on Call) for the  Hospitalists listed on amion for assistance.

## 2021-01-22 NOTE — Progress Notes (Signed)
I was called by echo staff for treadmill echo. Pt was nauseous and felt like he was going to vomit. Test was postponed until he felt better. I was called again to perform treadmill echo; however, patient had atenolol this morning. We are unable to perform treadmill stress tests with a beta blocker on board. I reached out to Dr. Debara Pickett and confirmed that stress echo may not be conclusive in a patient with history of CABG. Unfortunately, its too late in the day and the patient is not NPO to perform a nuclear medicine stress test. I have reached out to the primary team, but have not heard back yet. I will cancel treadmill echo for now. Fortunately, we have resting echo images.   Tami Lin Miachel Nardelli, PA-C 01/22/2021, 1:02 PM [phone]

## 2021-01-23 ENCOUNTER — Observation Stay (HOSPITAL_BASED_OUTPATIENT_CLINIC_OR_DEPARTMENT_OTHER): Payer: PPO

## 2021-01-23 ENCOUNTER — Telehealth: Payer: Self-pay | Admitting: Internal Medicine

## 2021-01-23 DIAGNOSIS — I10 Essential (primary) hypertension: Secondary | ICD-10-CM | POA: Diagnosis not present

## 2021-01-23 DIAGNOSIS — R079 Chest pain, unspecified: Secondary | ICD-10-CM

## 2021-01-23 DIAGNOSIS — I25118 Atherosclerotic heart disease of native coronary artery with other forms of angina pectoris: Secondary | ICD-10-CM | POA: Diagnosis not present

## 2021-01-23 LAB — NM MYOCAR MULTI W/SPECT W/WALL MOTION / EF
Estimated workload: 1 METS
Exercise duration (min): 5 min
Exercise duration (sec): 18 s
LV dias vol: 56 mL (ref 62–150)
LV sys vol: 9 mL
Peak HR: 97 {beats}/min
RATE: 0.32
Rest HR: 64 {beats}/min
TID: 0.82

## 2021-01-23 LAB — CBC
HCT: 40.9 % (ref 39.0–52.0)
Hemoglobin: 13.3 g/dL (ref 13.0–17.0)
MCH: 29.6 pg (ref 26.0–34.0)
MCHC: 32.5 g/dL (ref 30.0–36.0)
MCV: 90.9 fL (ref 80.0–100.0)
Platelets: 120 10*3/uL — ABNORMAL LOW (ref 150–400)
RBC: 4.5 MIL/uL (ref 4.22–5.81)
RDW: 15.4 % (ref 11.5–15.5)
WBC: 6.6 10*3/uL (ref 4.0–10.5)
nRBC: 0 % (ref 0.0–0.2)

## 2021-01-23 MED ORDER — REGADENOSON 0.4 MG/5ML IV SOLN
0.4000 mg | Freq: Once | INTRAVENOUS | Status: AC
  Administered 2021-01-23: 0.4 mg via INTRAVENOUS

## 2021-01-23 MED ORDER — REGADENOSON 0.4 MG/5ML IV SOLN
INTRAVENOUS | Status: AC
  Filled 2021-01-23: qty 5

## 2021-01-23 MED ORDER — TECHNETIUM TC 99M TETROFOSMIN IV KIT
32.5000 | PACK | Freq: Once | INTRAVENOUS | Status: AC | PRN
  Administered 2021-01-23: 32.5 via INTRAVENOUS

## 2021-01-23 MED ORDER — TECHNETIUM TC 99M TETROFOSMIN IV KIT
10.5000 | PACK | Freq: Once | INTRAVENOUS | Status: AC | PRN
  Administered 2021-01-23: 10.5 via INTRAVENOUS

## 2021-01-23 MED ORDER — LORAZEPAM 0.5 MG PO TABS: 0.5000 mg | ORAL_TABLET | Freq: Two times a day (BID) | ORAL | 0 refills | Status: DC | PRN

## 2021-01-23 MED ORDER — DILTIAZEM HCL 30 MG PO TABS: 30.0000 mg | ORAL_TABLET | Freq: Four times a day (QID) | ORAL | 0 refills | Status: DC | PRN

## 2021-01-23 MED ORDER — REGADENOSON 0.4 MG/5ML IV SOLN
0.4000 mg | Freq: Once | INTRAVENOUS | Status: DC
  Filled 2021-01-23: qty 5

## 2021-01-23 MED ORDER — DILTIAZEM HCL 60 MG PO TABS
30.0000 mg | ORAL_TABLET | Freq: Once | ORAL | Status: AC
  Administered 2021-01-23: 30 mg via ORAL
  Filled 2021-01-23: qty 1

## 2021-01-23 NOTE — Discharge Summary (Signed)
Physician Discharge Summary  BYARD CARRANZA Sr. BHA:[id number] DOB: 1934/12/28 DOA: 01/21/2021  PCP: Claretta Fraise, MD  Admit date: 01/21/2021 Discharge date: 01/23/2021  Admitted From: home Disposition: Home  Recommendations for Outpatient Follow-up:  Follow up with PCP in 1-2 weeks Please obtain BMP/CBC in one week Please follow up as scheduled with Dr. Harrington Challenger next week  Home Health: No Equipment/Devices: None  Discharge Condition: Stable CODE STATUS: Full Diet recommendation: Heart Healthy     Discharge Diagnoses: Principal Problem:   Chest pain Active Problems:   HTN (hypertension), benign   BPPV (benign paroxysmal positional vertigo)   Thrombocytopenia (HCC)   CAD (coronary artery disease)   S/P CABG x 3    Summary of HPI and Hospital Course:   Ronald Russell. is a 86 y.o. male with medical history significant for CAD status post CABG in 2019, GERD, hypertension, temporal arteritis, prior bladder cancer who presents by EMS for evaluation of substernal chest pain with associated SOB and diaphoresis.  This occurred in the setting of elevated BP 170/105 and dizziness.   Admitted for observation and evaluation into the etiology of his chest pain.    Chest pain - in pt with hx of CAD s/p CABGx3.   Palpitations - chronic, on atenolol prior to admission. Troponin negative and no acute ischemic ECG changes.  Suspect his chest pain is due to his palpitations and the anxiety/panic he develops as a result; the timing of onset correlates.  Stress echo ordered on admission could not be done as patient had received beta blocker. Given prior CAD and CABG, Lexiscan was obtained today. It showed no evidence of ischemia; Low Risk Study.  Continue daily aspirin, PRN SL Nitro. Stopped atenolol given report of side effects (see below). Monitor BP's and HR. Telemetry without A-fib.  Chart reviewed - pt follows with Dr. Harrington Challenger.  He takes atenolol for palpitations.  Prior workup  revealed no A-fib, but PAC's/PVC's.  Patient is reporting intolerance to atenolol - he gets dizzy "swimmy headed" and is too unsteady to tolerate ambulation about 1-1.5 hours after taking it.  Wife endorses multiple near-falls.  Stopped atenolol for now.  Monitor off it. Will use PRN oral low dose diltiazem for HR>110 or SBP>160 for now (EF preserved on prior echos). Discussed this plan with Dr. Harrington Challenger. He has scheduled appointment with her on 8/16.     Dizziness / Ambulatory dysfunction with multiple near-falls  Questionable if BPPV - Has been to vestibular rehab in past without improvement. This is a chronic problem.  Patient states symptoms acutely become worse after he takes his atenolol.  Sometimes his BP will be low when this happens, but not consistently, and prior orthostatics normal per pt and wife.  Ambulating well.     Hx of Palpitations - followed by Dr. Harrington Challenger, as above. Has follow up on 8/16. Plan as above - trial of diltiazem PRN, stopped atenolol for now, monitor.     Hypertension - stop atenolol given apparent side effects. PRN oral diltiazem for now.   Thrombocytopenia - chronic.  Follow CBC.  Discharge Instructions   Discharge Instructions     Call MD for:  extreme fatigue   Complete by: As directed    Call MD for:  persistant dizziness or light-headedness   Complete by: As directed    Call MD for:  persistant nausea and vomiting   Complete by: As directed    Call MD for:  severe uncontrolled pain   Complete by:  As directed    Call MD for:  temperature >100.4   Complete by: As directed    Diet - low sodium heart healthy   Complete by: As directed    Discharge instructions   Complete by: As directed    Stop taking Atenolol for now.  Take diltiazem AS NEEDED up to every 6 hours if you're having heart palpitations, or if your heart rate is staying above 110 beats/min even when you're resting, or if your top BP number is above 150.  Diltiazem lowers the heart  rate and the blood pressure, similar to atenolol but is a different class of medication. I discussed this change with Dr. Harrington Challenger and she will see you next week in her office to see how you're doing with it. If you have bothersome side effects, please call her office to let her know.  I sent a low dose of Ativan (aka lorazepam) for panic attacks / anxiety.  Your outpatient doctors will discuss with you and decide if this will be continued.  We try to avoid long-term use of this medication due to it being a controlled substance, side effects especially in patients over age 11, and it can cause withdrawal symptoms if you take it every day for a long time.   Increase activity slowly   Complete by: As directed       Allergies as of 01/23/2021       Reactions   Uloric [febuxostat] Palpitations, Other (See Comments), Hypertension   Hypertension with palpitations and a sense of fuzziness and dizziness at the left side of his head   Doxazosin Diarrhea   Doxycycline Diarrhea   Omeprazole Other (See Comments)   Pantoprazole Other (See Comments)   Can tolerate 64m   Augmentin [amoxicillin-pot Clavulanate] Diarrhea   Ciprofloxacin Diarrhea   Codeine Nausea And Vomiting, Other (See Comments)   Severe headache   Levaquin [levofloxacin] Diarrhea   Oxycodone Nausea And Vomiting   Tramadol Nausea And Vomiting   Vicodin [hydrocodone-acetaminophen] Nausea And Vomiting        Medication List     STOP taking these medications    atenolol 25 MG tablet Commonly known as: TENORMIN   esomeprazole 40 MG capsule Commonly known as: NexIUM   fluticasone 50 MCG/ACT nasal spray Commonly known as: FLONASE   neomycin-polymyxin-hydrocortisone 3.5-10000-1 OTIC suspension Commonly known as: CORTISPORIN   Zantac 360 Max St 20 MG tablet Generic drug: famotidine       TAKE these medications    acetaminophen 650 MG CR tablet Commonly known as: TYLENOL Take 650 mg by mouth every 6 (six) hours as  needed for pain.   acidophilus Caps capsule Take 1 capsule by mouth every evening.   aspirin EC 81 MG tablet Take 1 tablet (81 mg total) by mouth daily.   diltiazem 30 MG tablet Commonly known as: CARDIZEM Take 1 tablet (30 mg total) by mouth every 6 (six) hours as needed (palpitations, HR >>811 or systolic BP >>914.   lansoprazole 15 MG capsule Commonly known as: PREVACID Take 15 mg by mouth at bedtime.   LORazepam 0.5 MG tablet Commonly known as: Ativan Take 1 tablet (0.5 mg total) by mouth 2 (two) times daily as needed for anxiety.   Potassium Gluconate 550 MG Tabs Take 1 tablet (550 mg total) by mouth at bedtime. Need to take 1 tab one day and 2 the next day alternating   promethazine 12.5 MG tablet Commonly known as: PHENERGAN Take 1 tablet (12.5 mg  total) by mouth every 8 (eight) hours as needed for nausea or vomiting.   psyllium 58.6 % powder Commonly known as: METAMUCIL Take 1 packet by mouth daily.   Repatha SureClick 245 MG/ML Soaj Generic drug: Evolocumab Inject 1 pen into the skin every 14 (fourteen) days.   sildenafil 20 MG tablet Commonly known as: REVATIO Take 1 tablet (20 mg total) by mouth daily as needed (2-5 as needed). What changed:  how much to take reasons to take this   VITAMIN B-12 PO Take 1 tablet by mouth daily.   Vitamin D (Cholecalciferol) 10 MCG (400 UNIT) Caps Take 400 Units by mouth daily.        Allergies  Allergen Reactions   Uloric [Febuxostat] Palpitations, Other (See Comments) and Hypertension    Hypertension with palpitations and a sense of fuzziness and dizziness at the left side of his head   Doxazosin Diarrhea   Doxycycline Diarrhea   Omeprazole Other (See Comments)   Pantoprazole Other (See Comments)    Can tolerate 35m   Augmentin [Amoxicillin-Pot Clavulanate] Diarrhea   Ciprofloxacin Diarrhea   Codeine Nausea And Vomiting and Other (See Comments)    Severe headache   Levaquin [Levofloxacin] Diarrhea    Oxycodone Nausea And Vomiting   Tramadol Nausea And Vomiting   Vicodin [Hydrocodone-Acetaminophen] Nausea And Vomiting     If you experience worsening of your admission symptoms, develop shortness of breath, life threatening emergency, suicidal or homicidal thoughts you must seek medical attention immediately by calling 911 or calling your MD immediately  if symptoms less severe.    Please note   You were cared for by a hospitalist during your hospital stay. If you have any questions about your discharge medications or the care you received while you were in the hospital after you are discharged, you can call the unit and asked to speak with the hospitalist on call if the hospitalist that took care of you is not available. Once you are discharged, your primary care physician will handle any further medical issues. Please note that NO REFILLS for any discharge medications will be authorized once you are discharged, as it is imperative that you return to your primary care physician (or establish a relationship with a primary care physician if you do not have one) for your aftercare needs so that they can reassess your need for medications and monitor your lab values.   Consultations: none   Procedures/Studies: NM Myocar Multi W/Spect W/Wall Motion / EF  Result Date: 01/23/2021  Defect 1: There is a medium defect of mild severity present in the basal inferior, mid inferior and apical inferior location.  This is a low risk study.  The left ventricular ejection fraction is hyperdynamic (>65%).  Nuclear stress EF: 83%.  There was no ST segment deviation noted during stress.  No T wave inversion was noted during stress.  No evidence of ischemia. There is a medium defect of mild severity in the inferior wall, present at both rest and stress. Normal wall motion in this area. Given significant extracardiac activity in this area, suspect this is artifact. Low risk study. Change compared to prior  study, but patient has had cardiac bypass surgery in the interim.   DG Chest Port 1 View  Result Date: 01/21/2021 CLINICAL DATA:  Shortness of breath and chest pain. EXAM: PORTABLE CHEST 1 VIEW COMPARISON:  Chest radiograph 12/30/2018 FINDINGS: The heart size and mediastinal contours are within normal limits. Aortic calcifications. Postoperative changes of CABG. Sternal  wires are aligned and intact. Both lungs are clear. Hiatal hernia, unchanged. The visualized skeletal structures are unremarkable. IMPRESSION: No active cardiopulmonary disease. Aortic Atherosclerosis (ICD10-I70.0). Electronically Signed   By: Ileana Roup MD   On: 01/21/2021 17:01   ECHOCARDIOGRAM LIMITED  Result Date: 01/22/2021    ECHOCARDIOGRAM LIMITED REPORT   Patient Name:   Ronald CRIGER Sr. Date of Exam: 01/22/2021 Medical Rec #:  [id number]          Height:       71.0 in Accession #:    [id number]         Weight:       187.0 lb Date of Birth:  1934/07/10          BSA:          2.049 m Patient Age:    28 years           BP:           141/76 mmHg Patient Gender: M                  HR:           60 bpm. Exam Location:  Inpatient Procedure: Limited Echo and Intracardiac Opacification Agent Indications:    R07.9* Chest pain, unspecified  History:        Patient has prior history of Echocardiogram examinations, most                 recent 01/08/2019. CAD, Prior CABG; Risk Factors:Hypertension.                 Thrombocytopenia. GERD. Bladder cancer.  Sonographer:    Darlina Sicilian RDCS Referring Phys: [id number] Eben Burow  Sonographer Comments: Stress Echo cancled, recently recieved a beta blocker. IMPRESSIONS  1. Incomplete stress echocardiogram study as the procedure was cancelled due to inability to achieve THR in the setting of beta blocker use. Conclusion(s)/Recommendation(s): Consider alternate modality such as cardiac CT or myoview perfusion imaging to evaluate for ischemia if warranted. FINDINGS  Left Ventricle: Incomplete  stress echocardiogram study as the procedure was cancelled due to inability to achieve THR in the setting of beta blocker use. Definity contrast agent was given IV to delineate the left ventricular endocardial borders. Lyman Bishop MD Electronically signed by Lyman Bishop MD Signature Date/Time: 01/22/2021/1:32:23 PM    Final       Subjective: Pt doing okay off atenolol but reports heart "skipping".  Had not yet been tried on diltiazem as PRN parameters not met (he does not get tachycardia with palpitations).  Given one time dose by RN during my encounter.  While we talked, patient paying very close attention to and expecting to feel onset of dizziness, appeared quite anxious.  We discussed methods of relaxing in episodes of panic and increased anxiety, and provided reassurance.   Discharge Exam: Vitals:   01/23/21 1115 01/23/21 1118  BP: (!) 170/74 (!) 146/62  Pulse: 90 85  Resp:    Temp:    SpO2:     Vitals:   01/23/21 1102 01/23/21 1112 01/23/21 1115 01/23/21 1118  BP: 131/83 (!) 164/67 (!) 170/74 (!) 146/62  Pulse: 68 93 90 85  Resp:      Temp:      TempSrc:      SpO2:      Weight:      Height:        General: Pt is alert, awake, not in acute distress Cardiovascular: RRR,  S1/S2 +, no rubs, no gallops Respiratory: CTA bilaterally, no wheezing, no rhonchi Abdominal: Soft, NT, ND, bowel sounds + Extremities: no edema, no cyanosis    The results of significant diagnostics from this hospitalization (including imaging, microbiology, ancillary and laboratory) are listed below for reference.     Microbiology: Recent Results (from the past 240 hour(s))  SARS CORONAVIRUS 2 (TAT 6-24 HRS) Nasopharyngeal Nasopharyngeal Swab     Status: None   Collection Time: 01/21/21  8:30 PM   Specimen: Nasopharyngeal Swab  Result Value Ref Range Status   SARS Coronavirus 2 NEGATIVE NEGATIVE Final    Comment: (NOTE) SARS-CoV-2 target nucleic acids are NOT DETECTED.  The SARS-CoV-2 RNA is  generally detectable in upper and lower respiratory specimens during the acute phase of infection. Negative results do not preclude SARS-CoV-2 infection, do not rule out co-infections with other pathogens, and should not be used as the sole basis for treatment or other patient management decisions. Negative results must be combined with clinical observations, patient history, and epidemiological information. The expected result is Negative.  Fact Sheet for Patients: SugarRoll.be  Fact Sheet for Healthcare Providers: https://www.woods-mathews.com/  This test is not yet approved or cleared by the [place] FDA and  has been authorized for detection and/or diagnosis of SARS-CoV-2 by FDA under an Emergency Use Authorization (EUA). This EUA will remain  in effect (meaning this test can be used) for the duration of the COVID-19 declaration under Se ction 564(b)(1) of the Act, 21 U.S.C. section 360bbb-3(b)(1), unless the authorization is terminated or revoked sooner.  Performed at [facility], [place] [address] [place], [place] [address]      Labs: BNP (last 3 results) No results for input(s): BNP in the last 8760 hours. Basic Metabolic Panel: Recent Labs  Lab 01/21/21 1733 01/21/21 2322  NA 137 139  K 3.6 3.9  CL 110 110  CO2 20* 20*  GLUCOSE 121* 109*  BUN 16 16  CREATININE 1.22 1.18  CALCIUM 8.6* 8.9   Liver Function Tests: Recent Labs  Lab 01/21/21 1733  AST 21  ALT 16  ALKPHOS 41  BILITOT 0.7  PROT 5.7*  ALBUMIN 3.3*   No results for input(s): LIPASE, AMYLASE in the last 168 hours. No results for input(s): AMMONIA in the last 168 hours. CBC: Recent Labs  Lab 01/21/21 1733 01/23/21 0031  WBC 8.6 6.6  NEUTROABS 6.6  --   HGB 13.6 13.3  HCT 41.7 40.9  MCV 90.7 90.9  PLT 126* 120*   Cardiac Enzymes: No results for input(s): CKTOTAL, CKMB, CKMBINDEX, TROPONINI in the last 168 hours. BNP: Invalid  input(s): POCBNP CBG: No results for input(s): GLUCAP in the last 168 hours. D-Dimer No results for input(s): DDIMER in the last 72 hours. Hgb A1c No results for input(s): HGBA1C in the last 72 hours. Lipid Profile No results for input(s): CHOL, HDL, LDLCALC, TRIG, CHOLHDL, LDLDIRECT in the last 72 hours. Thyroid function studies No results for input(s): TSH, T4TOTAL, T3FREE, THYROIDAB in the last 72 hours.  Invalid input(s): FREET3 Anemia work up No results for input(s): VITAMINB12, FOLATE, FERRITIN, TIBC, IRON, RETICCTPCT in the last 72 hours. Urinalysis    Component Value Date/Time   COLORURINE YELLOW 12/06/2017 1840   APPEARANCEUR Clear 03/26/2020 1340   LABSPEC 1.013 12/06/2017 1840   PHURINE 5.0 12/06/2017 1840   GLUCOSEU Negative 03/26/2020 1340   HGBUR NEGATIVE 12/06/2017 1840   BILIRUBINUR Negative 03/26/2020 [place] 12/06/2017 1840  PROTEINUR Negative 03/26/2020 [place] 12/06/2017 1840   UROBILINOGEN 0.2 08/31/2011 1403   NITRITE Negative 03/26/2020 1340   NITRITE NEGATIVE 12/06/2017 1840   LEUKOCYTESUR Negative 03/26/2020 1340   Sepsis Labs Invalid input(s): PROCALCITONIN,  WBC,  LACTICIDVEN Microbiology Recent Results (from the past 240 hour(s))  SARS CORONAVIRUS 2 (TAT 6-24 HRS) Nasopharyngeal Nasopharyngeal Swab     Status: None   Collection Time: 01/21/21  8:30 PM   Specimen: Nasopharyngeal Swab  Result Value Ref Range Status   SARS Coronavirus 2 NEGATIVE NEGATIVE Final    Comment: (NOTE) SARS-CoV-2 target nucleic acids are NOT DETECTED.  The SARS-CoV-2 RNA is generally detectable in upper and lower respiratory specimens during the acute phase of infection. Negative results do not preclude SARS-CoV-2 infection, do not rule out co-infections with other pathogens, and should not be used as the sole basis for treatment or other patient management decisions. Negative results must be combined with clinical  observations, patient history, and epidemiological information. The expected result is Negative.  Fact Sheet for Patients: SugarRoll.be  Fact Sheet for Healthcare Providers: https://www.woods-mathews.com/  This test is not yet approved or cleared by the [place] FDA and  has been authorized for detection and/or diagnosis of SARS-CoV-2 by FDA under an Emergency Use Authorization (EUA). This EUA will remain  in effect (meaning this test can be used) for the duration of the COVID-19 declaration under Se ction 564(b)(1) of the Act, 21 U.S.C. section 360bbb-3(b)(1), unless the authorization is terminated or revoked sooner.  Performed at [facility], [place] [address], [place], [place] [address]      Time coordinating discharge: Over 30 minutes  SIGNED:   Ezekiel Slocumb, DO [facility] 01/23/2021, 1:57 PM   If 7PM-7AM, please contact night-coverage [email or web address]

## 2021-01-23 NOTE — Telephone Encounter (Signed)
Pt c/o medication issue:  1. Name of Medication:   2. How are you currently taking this medication (dosage and times per day)?   3. Are you having a reaction (difficulty breathing--STAT)?   4. What is your medication issue? Ronald Russell states they changed his meds in the hospital and wants to discuss it with [name]. Reporting it really important. Please advise.

## 2021-01-23 NOTE — Progress Notes (Signed)
Ronald Fear Sr. presented for a nuclear stress test today.  No immediate complications.  Stress imaging is pending at this time.   Preliminary EKG findings may be listed in the chart, but the stress test result will not be finalized until perfusion imaging is complete.   [place], [place]  01/23/2021 11:21 AM

## 2021-01-23 NOTE — Telephone Encounter (Signed)
Patient reports that at the hospital changed atenolol to diltiazem short acting.  He is unable to tolerate. Took at 1:30 pm and at 2:30 pm--felt  narcotic hangover, tightness on head, lips got cold.  Now, he would prefer to not take either medication (dilt or atenolol) to see if the symptoms go away.    He wants to not take either medication for now and see if he gets these kinds of symptoms go away and dont come back.    He is aware that I am forwarding to Dr. Harrington Challenger to make her aware.

## 2021-01-23 NOTE — Progress Notes (Signed)
PT Cancellation Note  Patient Details Name: Ronald ELLERY Sr. MRN: [id number] DOB: 28-Oct-1934   Cancelled Treatment:    Reason Eval/Treat Not Completed: Patient at procedure or test/unavailable.  Pt at nuclear medicine for stress test.  PT to check back later today or tomorrow as time allows.   Thanks,  Verdene Lennert, PT, DPT  Acute Rehabilitation Ortho Tech Supervisor [phone] pager #[phone] office      Wells Guiles B Ezeriah Luty 01/23/2021, 10:54 AM

## 2021-01-24 ENCOUNTER — Telehealth: Payer: Self-pay | Admitting: Student

## 2021-01-24 NOTE — Telephone Encounter (Signed)
   The patient called the after-hours line reporting he is now feeling his heart skip. He reported feeling dizzy and "swimmy-headed" with Atenolol and this was stopped during his recent admission and switched to short-acting Cardizem but he did not tolerate this either as outlined in the phone note from 8/12. He was already on the lowest dose of Atenolol 12.5mg  daily and does not wish to be on either at this time. He reports several family members are on [place] and experienced improvement in their palpitations with that and he wishes to take an OTC Mg supplement to see if this helps. I did not see a recent Mg level in his chart and recommended that if he starts Mg, then a Mg level shuld be checked at his upcoming visit with Dr. Harrington Challenger this week.   His BP was at 142/70 last night with HR of 74 and similar readings this AM. I encouraged him to keep a BP log and bring this to his visit as he is not currently on an anti-hypertensive medication given the recent discontinuation of Atenolol.    He voiced understanding of this and was appreciative of the return call. Will route to Dr. Harrington Challenger to make her aware.   Signed, Erma Heritage, PA-C 01/24/2021, 3:28 PM

## 2021-01-25 ENCOUNTER — Telehealth: Payer: Self-pay | Admitting: Student

## 2021-01-25 NOTE — Telephone Encounter (Signed)
   The patient called the after-hours line again reporting that his blood pressure was elevated at 167/89 this morning with an associated headache.  No other neurologic symptoms. We discussed options in regards to restarting Atenolol or trying a different medication for blood pressure control. He wishes to restart Atenolol 12.5 mg daily for now and follow-up with Dr. Harrington Challenger on Tuesday to discuss further medication adjustments.  He was appreciative of the return call.  Signed, Erma Heritage, PA-C 01/25/2021, 10:16 AM

## 2021-01-27 ENCOUNTER — Other Ambulatory Visit: Payer: Self-pay

## 2021-01-27 ENCOUNTER — Ambulatory Visit: Payer: PPO | Admitting: Internal Medicine

## 2021-01-27 ENCOUNTER — Encounter: Payer: Self-pay | Admitting: Internal Medicine

## 2021-01-27 VITALS — BP 108/54 | HR 61 | Ht 71.0 in | Wt 194.4 lb

## 2021-01-27 DIAGNOSIS — I2510 Atherosclerotic heart disease of native coronary artery without angina pectoris: Secondary | ICD-10-CM | POA: Diagnosis not present

## 2021-01-27 MED ORDER — CLONIDINE HCL 0.1 MG PO TABS: 0.0500 mg | ORAL_TABLET | Freq: Two times a day (BID) | ORAL | 11 refills | Status: DC

## 2021-01-27 MED ORDER — FLUOCINONIDE 0.05 % EX CREA: 1.0000 "application " | TOPICAL_CREAM | Freq: Two times a day (BID) | CUTANEOUS | 0 refills | Status: DC

## 2021-01-27 NOTE — Patient Instructions (Signed)
Medication Instructions:  Your physician has recommended you make the following change in your medication:  1.) stop atenolol 2.) start clonidine 0.1 mg - take HALF TAB (0.05 mg) by mouth twice a day (morning and evening)  *If you need a refill on your cardiac medications before your next appointment, please call your pharmacy*   Lab Work: none   Testing/Procedures: none   Follow-Up: At [facility], you and your health needs are our priority.  As part of our continuing mission to provide you with exceptional heart care, we have created designated Provider Care Teams.  These Care Teams include your primary Cardiologist (physician) and Advanced Practice Providers (APPs -  Physician Assistants and Nurse Practitioners) who all work together to provide you with the care you need, when you need it.   Your next appointment:   4 month(s)  The format for your next appointment:   In Person  Provider:   You may see Dorris Carnes, MD or one of the following Advanced Practice Providers on your designated Care Team:   Richardson Dopp, PA-C Robbie Lis, [place]   Other Instructions

## 2021-01-27 NOTE — Progress Notes (Signed)
Cardiology Office Note:    Date:  01/27/2021   ID:  Ronald Fear Sr., DOB [birth year], MRN [id number]  PCP:  Ronald Fraise, MD  Cardiologist:  Ronald Carnes, MD   Electrophysiologist:  None   Referring MD: Ronald Fraise, MD   F/U of CAD     History of Present Illness:    Ronald Amores. is a 86 y.o. male with coronary artery disease (s/p CABG in June 2019; postop with atrial fibrillation)   Holter monitor in Sept 2019 shows NSR, occasional PACs/PVCs  Again in Aug 2020 SR with PACs  No afib   Echo in July 2020 LVEF normal   He has had palpitations    Adjustment of meds limited by bradycardia   The pt also has a hx of HTN, HL bladder cancer, temporal arteritis, hypertension, hyperlipidemia  (patient intolerant to statins) IN addition the pt has had complaints of dizines   Seen by Ronald Russell (ENT) and Ronald Russell to come up with cause.   Pt with to balance rehab   No help    Pt has said the symptoms are worse on L side of he     I saw him in May 2022 in clinic  BP wa good at that visit    He was seen by Ronald Russell in July 2022 for med recomm for cholesterol  Main complaints were dizziness  (extensive work up in past)  Agreed to trying [id number] The pt called in with headache  BP OK  Worried it was due to [place]  On 8/10 called  Atenolol held due to dizziness   BP high 175/105   Complained of chest pressure  Took atenolol and swimmy headed, unsteady   Went to ED 01/21/21 with CP and SOB and diaphoresis Blood pressure high  Pt dizzy   Pt set up for a lexiscan myoview which showed no ischemia   Continued on asa.   Atenolol stopped    Plan for PRN dilt    The pt called in on day of discharge and said he could not tolerate dilitazem  Hungover feeling   Tight in head  Lips cold    Called in 8/13  Wanted to try magnesium   Called in again   BP high   Wanted to go back on atenolol  The pt presents today  Again, notes problems with meds and "swimmy" sensation   Denies CP    Prior CV studies:    The following studies were reviewed today:  66 Hr Holter 02/2018 Sinus rhythm   51 to 106 bpm   Average HR 71 bpm Occasional PVC  Occasional PAC Longest pause 1.5 sec Diary entry associated with SR and also SR with  PAC  Pre CABG Dopplers 12/04/17  Final Interpretation: Right Carotid: The extracranial vessels were near-normal with only minimal wall thickening or plaque. Left Carotid: The extracranial vessels were near-normal with only minimal wall thickening or plaque. Vertebrals:  Bilateral vertebral arteries demonstrate antegrade flow. Subclavians: Normal flow hemodynamics were seen in bilateral subclavian arteries. Right Upper Extremity: Doppler waveforms remain within normal limits with compression. Doppler waveforms remain within normal limits with compression. Left Upper Extremity: Doppler waveforms remain within normal limits with compression. Doppler waveforms remain within normal limits with compression.   Cardiac Catheterization 12/02/17 LAD prox 75, mid 100 (R-L collats); D2 75 LCx prox 80, mid 70 RCA mild disease EF 55-65   Nuclear stress test 11/24/17 Intermediate risk stress  nuclear study with a new area of mild ischemia in the distal LAD artery territory. Normal left ventricular regional and global systolic function. EF 85   Event Monitor 08/2016 Sinus rhythm with PACs  Sensed as skips     Echo 09/01/16 EF 60-65, no RWMA, Gr 2 DD   Past Medical History:  Diagnosis Date   Allergy    Anxiety    Bladder cancer ([facility]) 2010   Tx with BCG   CAD (coronary artery disease)    LHC 6/19: pLAD 75, mLAD 100, D2 75; pLCx 80, mLCx 70, EF 55-65 >> s/p CABG // Echo 3/18: EF 60-65, Gr 2 DD   Chronic bronchitis ([facility])    "yearly the last 3 yrs" (02/13/2013)   Dysrhythmia    GERD (gastroesophageal reflux disease)    History of blood transfusion 1949   "seeral" (02/13/2013)   HOH (hard of hearing)    Hypertension    Ocular migraine    "not often" (02/13/2013)   Pneumonia    "last  time ~ 2 yr ago; had it before that too" (02/13/2013)   Seasonal allergies    Shingles    has neuropathy since it happened in 6/17   Temporal arteritis (HCC)    Thrombocytopenia ([facility]) 11/29/2016   Surgical Hx: The patient  has a past surgical history that includes Transurethral resection of bladder tumor (2010 X 3); mastoid tumor removed (Right, 1964); Tonsillectomy (1940's); Skin graft (Right, 1949); Colonoscopy; Vasectomy; Artery Biopsy (Left, 01/09/2013); Cardiovascular stress test (07/2011); Cholecystectomy (N/A, 11/30/2016); LEFT HEART CATH AND CORONARY ANGIOGRAPHY (N/A, 12/02/2017); Coronary artery bypass graft (N/A, 12/07/2017); TEE without cardioversion (N/A, 12/07/2017); Skin graft (Right); and Cystoscopy with biopsy (N/A, 04/21/2020).   Current Medications: Current Meds  Medication Sig   acetaminophen (TYLENOL) 650 MG CR tablet Take 650 mg by mouth every 6 (six) hours as needed for pain.   acidophilus (RISAQUAD) CAPS capsule Take 1 capsule by mouth every evening.   aspirin EC 81 MG tablet Take 1 tablet (81 mg total) by mouth daily.   Cyanocobalamin (VITAMIN B-12 PO) Take 1 tablet by mouth daily.   diltiazem (CARDIZEM) 30 MG tablet Take 1 tablet (30 mg total) by mouth every 6 (six) hours as needed (palpitations, HR >379, or systolic BP >024).   Evolocumab (REPATHA SURECLICK) 097 MG/ML SOAJ Inject 1 pen into the skin every 14 (fourteen) days.   lansoprazole (PREVACID) 15 MG capsule Take 15 mg by mouth at bedtime.   LORazepam (ATIVAN) 0.5 MG tablet Take 1 tablet (0.5 mg total) by mouth 2 (two) times daily as needed for anxiety.   Potassium Gluconate 550 MG TABS Take 1 tablet (550 mg total) by mouth at bedtime. Need to take 1 tab one day and 2 the next day alternating   promethazine (PHENERGAN) 12.5 MG tablet Take 1 tablet (12.5 mg total) by mouth every 8 (eight) hours as needed for nausea or vomiting.   psyllium (METAMUCIL) 58.6 % powder Take 1 packet by mouth daily.   sildenafil (REVATIO) 20 MG  tablet Take 1 tablet (20 mg total) by mouth daily as needed (2-5 as needed).   Vitamin D, Cholecalciferol, 10 MCG (400 UNIT) CAPS Take 400 Units by mouth daily.     Allergies:   Uloric [febuxostat], Doxazosin, Doxycycline, Omeprazole, Pantoprazole, Augmentin [amoxicillin-pot clavulanate], Ciprofloxacin, Codeine, Levaquin [levofloxacin], Oxycodone, Tramadol, and Vicodin [hydrocodone-acetaminophen]   Social History   Tobacco Use   Smoking status: Former    Packs/day: 0.75    Years: 3.00  Pack years: 2.25    Types: Cigarettes   Smokeless tobacco: Never   Tobacco comments:    02/13/2013 "quit smoking age 40"  Vaping Use   Vaping Use: Never used  Substance Use Topics   Alcohol use: No    Comment: 02/13/2013 "probably a pint of whiskey/wk up til I was probably 86 yr old"   Drug use: No     Family Hx: The patient's family history includes Alzheimer's disease in his father; Cancer in his mother, sister, and sister. There is no history of Anemia, Arrhythmia, Asthma, Clotting disorder, Fainting, Heart attack, Heart disease, Heart failure, Hyperlipidemia, Hypertension, or Migraines.  ROS:   Please see the history of present illness.    All other systems reviewed and are negative.   EKGs/Labs/Other Test Reviewed:    EKG:   Sinus bradycardia 55 bpm  First degree AV block RBBB  Recent Labs: 10/21/2020: TSH 3.810 01/21/2021: ALT 16; BUN 16; Creatinine, Ser 1.18; Potassium 3.9; Sodium 139 01/23/2021: Hemoglobin 13.3; Platelets 120   Recent Lipid Panel Lab Results  Component Value Date/Time   CHOL 176 10/21/2020 03:21 PM   TRIG 212 (H) 10/21/2020 03:21 PM   HDL 34 (L) 10/21/2020 03:21 PM   CHOLHDL 5.2 (H) 10/21/2020 03:21 PM   CHOLHDL 3.3 12/02/2017 12:53 AM   LDLCALC 105 (H) 10/21/2020 03:21 PM    Physical Exam:    VS:  BP (!) 108/54   Pulse 61   Ht 5\' 11"  (1.803 m)   Wt 194 lb 6.4 oz (88.2 kg)   SpO2 97%   BMI 27.11 kg/m     Wt Readings from Last 3 Encounters:  01/27/21 194  lb 6.4 oz (88.2 kg)  01/22/21 191 lb 11.2 oz (87 kg)  01/08/21 195 lb 6.4 oz (88.6 kg)   Gen:   Pt is in NAD  Neck:   JVP is normal    lungs:   Clear to ausculation Cardiac RRR S1, S2  No S3    No signific murmurs  Abd   Supple   No hepatomegaly   No masses  Ext  Triv LE   edema     2+ pulses    Neuro exam:  Deferred     EKG EKG is not done today  ASSESSMENT & PLAN:    1   HTN  Difficult   Not tolerating meds    I would reocmm addling low dose clonidine   0.05 bid to start    Follow BP and heart rate.   He has not tolerated dilt, atenolo   Amlodipine low dose may be an option.  Hydralzine would be too much      2  CAD   Pt s/p CABG in 2019    Myoview this recent admit showed no ischemia  Chest prressure prob due to BP elevations  3  Dizziness  A chronic problem  Prob multiple contributors./etiologies   Meds can exacerbate   Follow   4 HL   Intolerant to statins  Repatha initiated  Need to confirm if taking     5  Palpitatons   Pt without complaints  Follow with change in meds       Medication Adjustments/Labs and Tests Ordered: Current medicines are reviewed at length with the patient today.  Concerns regarding medicines are outlined above.  Tests Ordered: No orders of the defined types were placed in this encounter.  Medication Changes: No orders of the defined types were placed in this  encounter.   Signed, Ronald Carnes, MD  01/27/2021 3:46 PM    [address], [place], [place]  [address] Phone: [phone]; Fax: [phone]

## 2021-01-29 ENCOUNTER — Other Ambulatory Visit: Payer: Self-pay

## 2021-01-29 ENCOUNTER — Ambulatory Visit (INDEPENDENT_AMBULATORY_CARE_PROVIDER_SITE_OTHER): Payer: PPO | Admitting: Otolaryngology

## 2021-01-29 DIAGNOSIS — R42 Dizziness and giddiness: Secondary | ICD-10-CM | POA: Diagnosis not present

## 2021-01-29 DIAGNOSIS — H903 Sensorineural hearing loss, bilateral: Secondary | ICD-10-CM | POA: Diagnosis not present

## 2021-01-29 DIAGNOSIS — H6123 Impacted cerumen, bilateral: Secondary | ICD-10-CM | POA: Diagnosis not present

## 2021-01-29 NOTE — Progress Notes (Signed)
HPI: Ronald LENGACHER Sr. is a 86 y.o. male who returns today for evaluation of dizziness referred by his cardiologist to have his ears checked.  He has had longstanding hearing problems.  He has hearing aids for both ears.  He feels like he is lost more hearing in the left ear recently.  He has a longstanding very narrowed right ear canal and has had intermittent drainage from the right ear but is having no drainage from either ear today in the office..  Past Medical History:  Diagnosis Date   Allergy    Anxiety    Bladder cancer ([facility]) 2010   Tx with BCG   CAD (coronary artery disease)    LHC 6/19: pLAD 75, mLAD 100, D2 75; pLCx 80, mLCx 70, EF 55-65 >> s/p CABG // Echo 3/18: EF 60-65, Gr 2 DD   Chronic bronchitis ([facility])    "yearly the last 3 yrs" (02/13/2013)   Dysrhythmia    GERD (gastroesophageal reflux disease)    History of blood transfusion 1949   "seeral" (02/13/2013)   HOH (hard of hearing)    Hypertension    Ocular migraine    "not often" (02/13/2013)   Pneumonia    "last time ~ 2 yr ago; had it before that too" (02/13/2013)   Seasonal allergies    Shingles    has neuropathy since it happened in 6/17   Temporal arteritis ([facility])    Thrombocytopenia ([facility]) 11/29/2016   Past Surgical History:  Procedure Laterality Date   ARTERY BIOPSY Left 01/09/2013   Procedure: BIOPSY TEMPORAL ARTERY;  Surgeon: Rozetta Nunnery, MD;  Location: [facility];  Service: ENT;  Laterality: Left;   CARDIOVASCULAR STRESS TEST  07/2011   No evidence of ischemia; EF 79%   CHOLECYSTECTOMY N/A 11/30/2016   Procedure: LAPAROSCOPIC CHOLECYSTECTOMY;  Surgeon: Aviva Signs, MD;  Location: [facility];  Service: General;  Laterality: N/A;   COLONOSCOPY     CORONARY ARTERY BYPASS GRAFT N/A 12/07/2017   Procedure: CORONARY ARTERY BYPASS GRAFTING (CABG) times three using left internal mammary artery and left endoscopically harvested saphenous vein graft;  Surgeon: Melrose Nakayama, MD;  Location: [facility];   Service: Open Heart Surgery;  Laterality: N/A;   CYSTOSCOPY WITH BIOPSY N/A 04/21/2020   Procedure: CYSTOSCOPY WITH BLADDER BIOPSY/ FULGURATION;  Surgeon: Raynelle Bring, MD;  Location: [facility];  Service: Urology;  Laterality: N/A;  ONLY NEEDS 45 MIN   LEFT HEART CATH AND CORONARY ANGIOGRAPHY N/A 12/02/2017   Procedure: LEFT HEART CATH AND CORONARY ANGIOGRAPHY;  Surgeon: Jettie Booze, MD;  Location: [facility];  Service: Cardiovascular;  Laterality: N/A;   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[place]    lower lower leg burn; "probably 4-5 ORs in 1949 for this" (02/13/2013)   SKIN GRAFT Right    upper and lower leg   TEE WITHOUT CARDIOVERSION N/A 12/07/2017   Procedure: TRANSESOPHAGEAL ECHOCARDIOGRAM (TEE);  Surgeon: Melrose Nakayama, MD;  Location: [facility];  Service: Open Heart Surgery;  Laterality: N/A;   TONSILLECTOMY  1940's   TRANSURETHRAL RESECTION OF BLADDER TUMOR  2010 X 3   "cancer" (02/13/2013)   VASECTOMY     Social History   Socioeconomic History   Marital status: Married    Spouse name: Ann   Number of children: 4   Years of education: GED   Highest education level: GED or equivalent  Occupational History   Occupation: Retired  Comment: Security  Tobacco Use   Smoking status: Former    Packs/day: 0.75    Years: 3.00    Pack years: 2.25    Types: Cigarettes   Smokeless tobacco: Never   Tobacco comments:    02/13/2013 "quit smoking age 78"  Vaping Use   Vaping Use: Never used  Substance and Sexual Activity   Alcohol use: No    Comment: 02/13/2013 "probably a pint of whiskey/wk up til I was probably 86 yr old"   Drug use: No   Sexual activity: Yes    Birth control/protection: None  Other Topics Concern   Not on file  Social History Narrative   Lives with wife   Caffeine use: 15-16oz soda per day, no soda   Usually drinks 60 oz water daily   Social Determinants of Health   Financial Resource Strain: Low Risk    Difficulty of Paying  Living Expenses: Not hard at all  Food Insecurity: No Food Insecurity   Worried About Charity fundraiser in the Last Year: Never true   Ran Out of Food in the Last Year: Never true  Transportation Needs: No Transportation Needs   Lack of Transportation (Medical): No   Lack of Transportation (Non-Medical): No  Physical Activity: Sufficiently Active   Days of Exercise per Week: 7 days   Minutes of Exercise per Session: 30 min  Stress: No Stress Concern Present   Feeling of Stress : Not at all  Social Connections: Socially Integrated   Frequency of Communication with Friends and Family: More than three times a week   Frequency of Social Gatherings with Friends and Family: More than three times a week   Attends Religious Services: More than 4 times per year   Active Member of [employer] or Organizations: Yes   Attends Music therapist: More than 4 times per year   Marital Status: Married   Family History  Problem Relation Age of Onset   Cancer Mother        breast   Alzheimer's disease Father    Cancer Sister        Breast cancer   Cancer Sister        colon cancer   Anemia Neg Hx    Arrhythmia Neg Hx    Asthma Neg Hx    Clotting disorder Neg Hx    Fainting Neg Hx    Heart attack Neg Hx    Heart disease Neg Hx    Heart failure Neg Hx    Hyperlipidemia Neg Hx    Hypertension Neg Hx    Migraines Neg Hx    Allergies  Allergen Reactions   Uloric [Febuxostat] Palpitations, Other (See Comments) and Hypertension    Hypertension with palpitations and a sense of fuzziness and dizziness at the left side of his head   Doxazosin Diarrhea   Doxycycline Diarrhea   Omeprazole Other (See Comments)   Pantoprazole Other (See Comments)    Can tolerate 20mg    Augmentin [Amoxicillin-Pot Clavulanate] Diarrhea   Ciprofloxacin Diarrhea   Codeine Nausea And Vomiting and Other (See Comments)    Severe headache   Levaquin [Levofloxacin] Diarrhea   Oxycodone Nausea And Vomiting    Tramadol Nausea And Vomiting   Vicodin [Hydrocodone-Acetaminophen] Nausea And Vomiting   Prior to Admission medications   Medication Sig Start Date End Date Taking? Authorizing Provider  acetaminophen (TYLENOL) 650 MG CR tablet Take 650 mg by mouth every 6 (six) hours as needed for pain.  [provider]  acidophilus (RISAQUAD) CAPS capsule Take 1 capsule by mouth every evening.    [provider]  aspirin EC 81 MG tablet Take 1 tablet (81 mg total) by mouth daily. 11/25/17   Jettie Booze, MD  cloNIDine (CATAPRES) 0.1 MG tablet Take 0.5 tablets (0.05 mg total) by mouth 2 (two) times daily. 01/27/21   Fay Records, MD  Cyanocobalamin (VITAMIN B-12 PO) Take 1 tablet by mouth daily.    [provider]  diltiazem (CARDIZEM) 30 MG tablet Take 1 tablet (30 mg total) by mouth every 6 (six) hours as needed (palpitations, HR >242, or systolic BP >353). 01/23/21   Ezekiel Slocumb, DO  Evolocumab (REPATHA SURECLICK) 614 MG/ML SOAJ Inject 1 pen into the skin every 14 (fourteen) days. 01/06/21   Fay Records, MD  fluocinonide cream (LIDEX) 4.31 % Apply 1 application topically 2 (two) times daily. 01/27/21   Fay Records, MD  lansoprazole (PREVACID) 15 MG capsule Take 15 mg by mouth at bedtime.    [provider]  LORazepam (ATIVAN) 0.5 MG tablet Take 1 tablet (0.5 mg total) by mouth 2 (two) times daily as needed for anxiety. 01/23/21 01/23/22  Ezekiel Slocumb, DO  Potassium Gluconate 550 MG TABS Take 1 tablet (550 mg total) by mouth at bedtime. Need to take 1 tab one day and 2 the next day alternating 12/26/17   Blanchie Dessert, MD  promethazine (PHENERGAN) 12.5 MG tablet Take 1 tablet (12.5 mg total) by mouth every 8 (eight) hours as needed for nausea or vomiting. 12/04/20   Loman Brooklyn, FNP  psyllium (METAMUCIL) 58.6 % powder Take 1 packet by mouth daily.    [provider]  sildenafil (REVATIO) 20 MG tablet Take 1 tablet (20 mg total) by mouth daily  as needed (2-5 as needed). 12/19/19   Claretta Fraise, MD  Vitamin D, Cholecalciferol, 10 MCG (400 UNIT) CAPS Take 400 Units by mouth daily. 07/19/19   Fay Records, MD     Positive ROS: Otherwise negative  All other systems have been reviewed and were otherwise negative with the exception of those mentioned in the HPI and as above.  Physical Exam: Constitutional: Alert, well-appearing, no acute distress Ears: External ears without lesions or tenderness.  Left ear canal is clear.  Of note he has a dry central left TM perforation with no drainage.  The right ear canal is very narrowed making it difficult to clean and adequately visualize the TM.  He still has some purple medication in his ear canal from visit 2 and half months ago.  He is having no drainage in the ear canal and the ear canal was cleaned with curette.  On Dix-Hallpike testing he had no evidence of BPPV.  He had no vertigo more of just chronic imbalance.  On tuning fork testing Weber lateralized to the right side.  AC was greater than BC bilaterally and subjectively he heard better on the right side with a 1024 tuning fork. Nasal: External nose without lesions. Septum with mild deformity.. Clear nasal passages Oral: Lips and gums without lesions. Tongue and palate mucosa without lesions. Posterior oropharynx clear. Neck: No palpable adenopathy or masses Respiratory: Breathing comfortably  Skin: No facial/neck lesions or rash noted.  Cerumen impaction removal  Date/Time: 01/29/2021 1:32 PM Performed by: Rozetta Nunnery, MD Authorized by: Rozetta Nunnery, MD   Consent:    Consent obtained:  Verbal   Consent given by:  Patient  Risks discussed:  Pain and bleeding Procedure details:    Location:  L ear and R ear   Procedure type: curette and suction   Post-procedure details:    Inspection:  TM intact and canal normal   Hearing quality:  Improved   Procedure completion:  Tolerated well, no immediate  complications Comments:     Ear canals were cleaned in the office today with curette and suction.  He is having no drainage from either ear.  He has bilateral TM perforations but no active drainage from the perforations.  Assessment: Dizziness questionable etiology.  No clinical evidence of active ear infection or BPPV. Abnormally shaped right ear canal. Underlying bilateral sensorineural hearing loss.  Plan: Although his right ear is his better hearing ear using the hearing aid in his right ear is difficult because of the shape of the ear canal and recommended use of hearing aid in the left ear which is his larger ear canal.  Presently no signs of infection but would recommend keeping water and liquid out of the ear. He will follow-up as needed any active drainage from the ears.   Radene Journey, MD

## 2021-01-30 ENCOUNTER — Telehealth: Payer: Self-pay | Admitting: Internal Medicine

## 2021-01-30 NOTE — Telephone Encounter (Signed)
Spoke with the pt and he will continue to hold on the Clonidine and will monitor over the weekend an if anything worsens he will go to an Urgent care or call the MD on call.

## 2021-01-30 NOTE — Telephone Encounter (Signed)
I would have Ronald Russell stop clonidine. Dr. Harrington Challenger last note said low dose amlodipine would be an option. He should stop clonidine and keep an eye on BP over the weekend. If BP trends back up, we can consider amlodipine 1.25 mg daily.

## 2021-01-30 NOTE — Telephone Encounter (Signed)
Pt c/o medication issue:  1. Name of Medication: cloNIDine (CATAPRES) 0.1 MG tablet  2. How are you currently taking this medication (dosage and times per day)? Half tablet twice a day  3. Are you having a reaction (difficulty breathing--STAT)? no  4. What is your medication issue? Patient's wife states the patient just started taking the medication. She says he started taking it yesterday. She says his BP was 103/57 HR 47. She states he does feel dizzy and lightheaded.

## 2021-01-30 NOTE — Telephone Encounter (Signed)
Called the pt back and he says he has taken my recommendations and his BP is now 108/58 and HR 57 and he is feeling improved but worried about how he will feel when he gets up. I advised him that after continuing fluids if he gets up to rise slowly.. to continue to monitor and I will check on him again later today and let him know if the Pharmacist has any further recommendations on what to do with his meds over the weekend.

## 2021-01-30 NOTE — Telephone Encounter (Signed)
Spoke with the pt and he reports that he started Clonidine 0.5 mg bid yesterday morning his last dose was this morning at 9:am..   His BP now (10:45 am) is 103/57 and HR 47.  At his last OV 01/27/21 BP 108/54 and HR 61... pt is intolerant of many meds.   He feels swimmy headed and tired but sounded well on the phone... no slurred words or SOB noted.   I have asked him to sit and out his feet up... to drink some fluids and continue to monitor and if any worsening symptoms he could call EMS for assessment.   I will forward to the Pharmacist for recommendations re: his future doses and I will call him back shortly to check on him.

## 2021-01-31 ENCOUNTER — Telehealth: Payer: Self-pay | Admitting: Student in an Organized Health Care Education/Training Program

## 2021-01-31 ENCOUNTER — Other Ambulatory Visit: Payer: Self-pay | Admitting: Home Health

## 2021-01-31 ENCOUNTER — Telehealth: Payer: Self-pay | Admitting: Home Health

## 2021-01-31 MED ORDER — AMLODIPINE BESYLATE 2.5 MG PO TABS: 2.5000 mg | ORAL_TABLET | Freq: Every day | ORAL | 1 refills | Status: DC

## 2021-01-31 NOTE — Telephone Encounter (Signed)
Ronald Russell paged me to discuss his elevated blood pressure.  He spoke to one of the cardiology NP's earlier in the day and was started on amlodipine 2.5 mg daily for hypertension.  He states that 3 hours later his blood pressure was still elevated to the 140s to 170 range.  He is not having any cardiac symptoms at this time.  I counseled the patient that amlodipine takes several days to reach its maximum effect and that ultimately he may get better blood pressure control over the next 3 to 5 days.  However, since 2.5 mg amlodipine is a relatively small dose, I think it is reasonable for him to increase his dose to 5 mg daily at this time.  I instructed the patient to take 5 mg of amlodipine once daily and then to follow-up with his primary cardiologist next week after we have given time for the medication to have its maximal effect.  The patient verbalized understanding.

## 2021-01-31 NOTE — Telephone Encounter (Signed)
Patient paged after hour line reporting his blood pressure is fluctuating.  He noted blood pressure of 169/83 with apical pulse 65 when he was brushing his teeth this morning.  When he sat down, blood pressure improved to 142/80.  When he get up and walking, he noted blood pressure is going up to 173/98, apical pulse 68.  He is otherwise feeling okay.  He saw Dr. Harrington Challenger on 01/27/2021, notes mention that his blood pressure is difficult to control as patient is intolerant to multiple medication including diltiazem, atenolol.  He was initiated on low-dose clonidine 0.0 0.5 mg bid, but after taking 1 dose of the medication he noted blood pressure dropped to 107/57, and he was feeling dizzy and unwell, therefore he stopped taking clonidine.  Discussed with patient that we can initiate low-dose amlodipine 2.5mg , he denied any previous trial on this medication, is agreeable for trying this antihypertensive.  Advised patient to continue to record blood pressure while taking the amlodipine, reports back with blood pressure trend and if any side effects experienced.  New prescription has been sent to his pharmacy at [facility].   Patient subsequently complained 2 years duration of tongue numbness, he states he was hospitalized recently here and also saw ENT recently, but felt he was not listened to and his needs are not addressed.  Advised patient to seek a neurologist for further evaluation of chronic tongue numbness.  He has asked if referrals can be made, advised patient to check with his insurance and see if referral is required, if referral is required, he may ask his PCP for a referral.  Patient is agreeable.  All questions addressed during this phone encounter.

## 2021-02-01 ENCOUNTER — Telehealth: Payer: Self-pay | Admitting: Home Health

## 2021-02-01 NOTE — Telephone Encounter (Signed)
Patient called after hour line reports that his blood pressure had no improvement after taking amlodipine yesterday, he does not remember specific numbers of his blood pressure, he had taken 2.5 mg and 5 mg upon calling overnight MD, reports his blood pressure was around 170s every time when he goes to the bathroom.  He has chronic scalp numbness that did not resolve after stopping atenolol, and is wondering if he should go back on atenolol, as he felt the symptoms might not be due to atenolol side effect.  Suspect scalp numbness is due to history of temporal arteritis. Advised patient to resume atenolol today, continue monitor blood pressure.  Will attempt to schedule patient with Dr. Alan Ripper office at earlier interval for follow-up and further discussion of antihypertensive.  Patient is agreeable with above plan.

## 2021-02-02 DIAGNOSIS — H906 Mixed conductive and sensorineural hearing loss, bilateral: Secondary | ICD-10-CM | POA: Diagnosis not present

## 2021-02-02 DIAGNOSIS — R6889 Other general symptoms and signs: Secondary | ICD-10-CM | POA: Diagnosis not present

## 2021-02-02 DIAGNOSIS — H7292 Unspecified perforation of tympanic membrane, left ear: Secondary | ICD-10-CM | POA: Diagnosis not present

## 2021-02-02 DIAGNOSIS — H6123 Impacted cerumen, bilateral: Secondary | ICD-10-CM | POA: Diagnosis not present

## 2021-02-02 DIAGNOSIS — H61899 Other specified disorders of external ear, unspecified ear: Secondary | ICD-10-CM | POA: Diagnosis not present

## 2021-02-04 ENCOUNTER — Encounter: Payer: Self-pay | Admitting: Family Medicine

## 2021-02-04 ENCOUNTER — Ambulatory Visit (INDEPENDENT_AMBULATORY_CARE_PROVIDER_SITE_OTHER): Payer: PPO | Admitting: Family Medicine

## 2021-02-04 ENCOUNTER — Ambulatory Visit: Payer: PPO | Admitting: Family Medicine

## 2021-02-04 ENCOUNTER — Other Ambulatory Visit: Payer: Self-pay

## 2021-02-04 VITALS — BP 134/66 | HR 54 | Temp 98.0°F | Ht 71.0 in | Wt 195.0 lb

## 2021-02-04 DIAGNOSIS — R002 Palpitations: Secondary | ICD-10-CM | POA: Diagnosis not present

## 2021-02-04 DIAGNOSIS — E782 Mixed hyperlipidemia: Secondary | ICD-10-CM

## 2021-02-04 DIAGNOSIS — R42 Dizziness and giddiness: Secondary | ICD-10-CM | POA: Diagnosis not present

## 2021-02-04 DIAGNOSIS — R7301 Impaired fasting glucose: Secondary | ICD-10-CM

## 2021-02-04 DIAGNOSIS — R519 Headache, unspecified: Secondary | ICD-10-CM

## 2021-02-04 LAB — BAYER DCA HB A1C WAIVED: HB A1C (BAYER DCA - WAIVED): 5.3 % (ref <7.0)

## 2021-02-04 MED ORDER — PREDNISONE 10 MG PO TABS: 5.0000 mg | ORAL_TABLET | Freq: Every day | ORAL | 0 refills | Status: DC

## 2021-02-04 NOTE — Progress Notes (Signed)
Established Patient Office Visit  Subjective:  Patient ID: Ronald Willcox., male    DOB: May 24, 1935  Age: 86 y.o. MRN: [id number]  CC:  Chief Complaint  Patient presents with   Dizziness    HPI Ronald Russell presents for dizziness for years. He reports it feels like swimmy headedness and feeling off balance. He also feels a tightness around his head when this happens. He also has some scalp numbness and tingling on the left side with the dizziness. He reports a history of temporal arteritis on the left side.  He has been evaluated by his cardiologist and ENT for this without a known cause. His cardiologist recommended a referral to neurology. He was previously also evaluated by neurology for this years ago, no neurological cause was found. He reports that his symptoms resolve with prednisone and/or antibiotics.   He also reports that his cardiologist suggested have his magnesium level checked for his palpitation. He also has had fasting elevated glucose levels and would like an a1c checked.   Past Medical History:  Diagnosis Date   Allergy    Anxiety    Bladder cancer ([place]) 2010   Tx with BCG   CAD (coronary artery disease)    LHC 6/19: pLAD 75, mLAD 100, D2 75; pLCx 80, mLCx 70, EF 55-65 >> s/p CABG // Echo 3/18: EF 60-65, Gr 2 DD   Chronic bronchitis ([place])    "yearly the last 3 yrs" (02/13/2013)   Dysrhythmia    GERD (gastroesophageal reflux disease)    History of blood transfusion 1949   "seeral" (02/13/2013)   HOH (hard of hearing)    Hypertension    Ocular migraine    "not often" (02/13/2013)   Pneumonia    "last time ~ 2 yr ago; had it before that too" (02/13/2013)   Seasonal allergies    Shingles    has neuropathy since it happened in 6/17   Temporal arteritis ([place])    Thrombocytopenia ([place]) 11/29/2016    Past Surgical History:  Procedure Laterality Date   ARTERY BIOPSY Left 01/09/2013   Procedure: BIOPSY TEMPORAL ARTERY;  Surgeon: Rozetta Nunnery, MD;   Location: [facility];  Service: ENT;  Laterality: Left;   CARDIOVASCULAR STRESS TEST  07/2011   No evidence of ischemia; EF 79%   CHOLECYSTECTOMY N/A 11/30/2016   Procedure: LAPAROSCOPIC CHOLECYSTECTOMY;  Surgeon: Aviva Signs, MD;  Location: [facility];  Service: General;  Laterality: N/A;   COLONOSCOPY     CORONARY ARTERY BYPASS GRAFT N/A 12/07/2017   Procedure: CORONARY ARTERY BYPASS GRAFTING (CABG) times three using left internal mammary artery and left endoscopically harvested saphenous vein graft;  Surgeon: Melrose Nakayama, MD;  Location: [facility];  Service: Open Heart Surgery;  Laterality: N/A;   CYSTOSCOPY WITH BIOPSY N/A 04/21/2020   Procedure: CYSTOSCOPY WITH BLADDER BIOPSY/ FULGURATION;  Surgeon: Raynelle Bring, MD;  Location: [facility];  Service: Urology;  Laterality: N/A;  ONLY NEEDS 45 MIN   LEFT HEART CATH AND CORONARY ANGIOGRAPHY N/A 12/02/2017   Procedure: LEFT HEART CATH AND CORONARY ANGIOGRAPHY;  Surgeon: Jettie Booze, MD;  Location: [facility];  Service: Cardiovascular;  Laterality: N/A;   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[place]    lower lower leg burn; "probably 4-5 ORs in 1949 for this" (02/13/2013)   SKIN GRAFT Right    upper and lower leg   TEE WITHOUT CARDIOVERSION N/A 12/07/2017   Procedure:  TRANSESOPHAGEAL ECHOCARDIOGRAM (TEE);  Surgeon: Melrose Nakayama, MD;  Location: [facility];  Service: Open Heart Surgery;  Laterality: N/A;   TONSILLECTOMY  1940's   TRANSURETHRAL RESECTION OF BLADDER TUMOR  2010 X 3   "cancer" (02/13/2013)   VASECTOMY      Family History  Problem Relation Age of Onset   Cancer Mother        breast   Alzheimer's disease Father    Cancer Sister        Breast cancer   Cancer Sister        colon cancer   Anemia Neg Hx    Arrhythmia Neg Hx    Asthma Neg Hx    Clotting disorder Neg Hx    Fainting Neg Hx    Heart attack Neg Hx    Heart disease Neg Hx    Heart failure Neg Hx    Hyperlipidemia Neg Hx     Hypertension Neg Hx    Migraines Neg Hx     Social History   Socioeconomic History   Marital status: Married    Spouse name: Ann   Number of children: 4   Years of education: GED   Highest education level: GED or equivalent  Occupational History   Occupation: Retired    Comment: Security  Tobacco Use   Smoking status: Former    Packs/day: 0.75    Years: 3.00    Pack years: 2.25    Types: Cigarettes   Smokeless tobacco: Never   Tobacco comments:    02/13/2013 "quit smoking age 70"  Vaping Use   Vaping Use: Never used  Substance and Sexual Activity   Alcohol use: No    Comment: 02/13/2013 "probably a pint of whiskey/wk up til I was probably 86 yr old"   Drug use: No   Sexual activity: Yes    Birth control/protection: None  Other Topics Concern   Not on file  Social History Narrative   Lives with wife   Caffeine use: 15-16oz soda per day, no soda   Usually drinks 60 oz water daily   Social Determinants of Health   Financial Resource Strain: Low Risk    Difficulty of Paying Living Expenses: Not hard at all  Food Insecurity: No Food Insecurity   Worried About Charity fundraiser in the Last Year: Never true   Ran Out of Food in the Last Year: Never true  Transportation Needs: No Transportation Needs   Lack of Transportation (Medical): No   Lack of Transportation (Non-Medical): No  Physical Activity: Sufficiently Active   Days of Exercise per Week: 7 days   Minutes of Exercise per Session: 30 min  Stress: No Stress Concern Present   Feeling of Stress : Not at all  Social Connections: Socially Integrated   Frequency of Communication with Friends and Family: More than three times a week   Frequency of Social Gatherings with Friends and Family: More than three times a week   Attends Religious Services: More than 4 times per year   Active Member of [employer] or Organizations: Yes   Attends Music therapist: More than 4 times per year   Marital Status: Married   Human resources officer Violence: Not At Risk   Fear of Current or Ex-Partner: No   Emotionally Abused: No   Physically Abused: No   Sexually Abused: No    Outpatient Medications Prior to Visit  Medication Sig Dispense Refill   acetaminophen (TYLENOL) 650 MG CR tablet  Take 650 mg by mouth every 6 (six) hours as needed for pain.     acidophilus (RISAQUAD) CAPS capsule Take 1 capsule by mouth every evening.     aspirin EC 81 MG tablet Take 1 tablet (81 mg total) by mouth daily. 90 tablet 3   atenolol (TENORMIN) 12.5 mg TABS tablet Take 12.5 mg by mouth.     Cyanocobalamin (VITAMIN B-12 PO) Take 1 tablet by mouth daily.     fluocinonide cream (LIDEX) 7.12 % Apply 1 application topically 2 (two) times daily. 30 g 0   lansoprazole (PREVACID) 15 MG capsule Take 15 mg by mouth at bedtime.     LORazepam (ATIVAN) 0.5 MG tablet Take 1 tablet (0.5 mg total) by mouth 2 (two) times daily as needed for anxiety. 20 tablet 0   Potassium Gluconate 550 MG TABS Take 1 tablet (550 mg total) by mouth at bedtime. Need to take 1 tab one day and 2 the next day alternating 30 tablet 0   promethazine (PHENERGAN) 12.5 MG tablet Take 1 tablet (12.5 mg total) by mouth every 8 (eight) hours as needed for nausea or vomiting. 20 tablet 0   psyllium (METAMUCIL) 58.6 % powder Take 1 packet by mouth daily.     sildenafil (REVATIO) 20 MG tablet Take 1 tablet (20 mg total) by mouth daily as needed (2-5 as needed). 150 tablet 1   Vitamin D, Cholecalciferol, 10 MCG (400 UNIT) CAPS Take 400 Units by mouth daily. 100 capsule 12   amLODipine (NORVASC) 2.5 MG tablet Take 1 tablet (2.5 mg total) by mouth daily. (Patient not taking: Reported on 02/04/2021) 30 tablet 1   cloNIDine (CATAPRES) 0.1 MG tablet Take 0.5 tablets (0.05 mg total) by mouth 2 (two) times daily. (Patient not taking: Reported on 02/04/2021) 60 tablet 11   diltiazem (CARDIZEM) 30 MG tablet Take 1 tablet (30 mg total) by mouth every 6 (six) hours as needed (palpitations, HR  >458, or systolic BP >099). (Patient not taking: Reported on 02/04/2021) 60 tablet 0   Evolocumab (REPATHA SURECLICK) 833 MG/ML SOAJ Inject 1 pen into the skin every 14 (fourteen) days. (Patient not taking: Reported on 02/04/2021) 6 mL 3   No facility-administered medications prior to visit.    Allergies  Allergen Reactions   Uloric [Febuxostat] Palpitations, Other (See Comments) and Hypertension    Hypertension with palpitations and a sense of fuzziness and dizziness at the left side of his head   Doxazosin Diarrhea   Doxycycline Diarrhea   Omeprazole Other (See Comments)   Pantoprazole Other (See Comments)    Can tolerate 62m   Augmentin [Amoxicillin-Pot Clavulanate] Diarrhea   Ciprofloxacin Diarrhea   Codeine Nausea And Vomiting and Other (See Comments)    Severe headache   Levaquin [Levofloxacin] Diarrhea   Oxycodone Nausea And Vomiting   Tramadol Nausea And Vomiting   Vicodin [Hydrocodone-Acetaminophen] Nausea And Vomiting    ROS Review of Systems Negative unless specially indicated above in HPI.   Objective:    Physical Exam Vitals and nursing note reviewed.  Constitutional:      General: He is not in acute distress.    Appearance: He is not ill-appearing, toxic-appearing or diaphoretic.  Eyes:     Extraocular Movements: Extraocular movements intact.     Pupils: Pupils are equal, round, and reactive to light.  Neck:     Thyroid: No thyroid mass, thyromegaly or thyroid tenderness.  Cardiovascular:     Rate and Rhythm: Normal rate and regular rhythm.  Heart sounds: No murmur heard.   No friction rub.  Musculoskeletal:     Right lower leg: No edema.     Left lower leg: No edema.  Skin:    General: Skin is warm and dry.  Neurological:     General: No focal deficit present.     Mental Status: He is alert and oriented to person, place, and time.     Gait: Gait normal.  Psychiatric:        Mood and Affect: Mood normal.        Behavior: Behavior normal.     BP 134/66   Pulse (!) 54   Temp 98 F (36.7 C) (Temporal)   Ht _0  (1.803 m)   Wt 195 lb (88.5 kg)   BMI 27.20 kg/m  Wt Readings from Last 3 Encounters:  02/04/21 195 lb (88.5 kg)  01/27/21 194 lb 6.4 oz (88.2 kg)  01/22/21 191 lb 11.2 oz (87 kg)     Health Maintenance Due  Topic Date Due   Zoster Vaccines- Shingrix (2 of 2) 03/21/2020   COVID-19 Vaccine (4 - Booster for Moderna series) 09/01/2020    There are no preventive care reminders to display for this patient.  Lab Results  Component Value Date   TSH 3.810 10/21/2020   Lab Results  Component Value Date   WBC 6.6 01/23/2021   HGB 13.3 01/23/2021   HCT 40.9 01/23/2021   MCV 90.9 01/23/2021   PLT 120 (L) 01/23/2021   Lab Results  Component Value Date   NA 139 01/21/2021   K 3.9 01/21/2021   CO2 20 (L) 01/21/2021   GLUCOSE 109 (H) 01/21/2021   BUN 16 01/21/2021   CREATININE 1.18 01/21/2021   BILITOT 0.7 01/21/2021   ALKPHOS 41 01/21/2021   AST 21 01/21/2021   ALT 16 01/21/2021   PROT 5.7 (L) 01/21/2021   ALBUMIN 3.3 (L) 01/21/2021   CALCIUM 8.9 01/21/2021   ANIONGAP 9 01/21/2021   EGFR 53 (L) 10/21/2020   Lab Results  Component Value Date   CHOL 176 10/21/2020   Lab Results  Component Value Date   HDL 34 (L) 10/21/2020   Lab Results  Component Value Date   LDLCALC 105 (H) 10/21/2020   Lab Results  Component Value Date   TRIG 212 (H) 10/21/2020   Lab Results  Component Value Date   CHOLHDL 5.2 (H) 10/21/2020   Lab Results  Component Value Date   HGBA1C 5.3 12/02/2017      Assessment & Plan:   Ronald Russell was seen today for dizziness.  Diagnoses and all orders for this visit:  Dizziness Has been evaluated by cardiology, ENT, and neurology for this without a known etiology. Reports dizziness responds to prednisone. Short low dose of prednisone ordered for now. Discussed to follow up with PCP if symptoms return following completion of course. Referral to neurology placed again.   -     predniSONE (DELTASONE) 10 MG tablet; Take 0.5 tablets (5 mg total) by mouth daily with breakfast for 10 days. -     Ambulatory referral to Neurology  Nonintractable headache, unspecified chronicity pattern, unspecified headache type With dizziness. Referral placed -     Ambulatory referral to Neurology  Elevated fasting glucose A1c pending.  -     Bayer DCA Hb A1c Waived  Mixed hyperlipidemia Was on repatha but this has been discontinued.  -     Lipid panel  Palpitations Labs pending. Seeing cardiology.  -  Magnesium -     TSH  Follow-up: Return if symptoms worsen or fail to improve.   The patient indicates understanding of these issues and agrees with the plan.   Gwenlyn Perking, FNP

## 2021-02-04 NOTE — Patient Instructions (Signed)
Dizziness Dizziness is a common problem. It is a feeling of unsteadiness or light-headedness. You may feel like you are about to faint. Dizziness can lead to injury if you stumble or fall. Anyone can become dizzy, but dizziness is more common in older adults. This condition can be caused by a number of things, including medicines, dehydration, or illness. Follow these instructions at home: Eating and drinking  Drink enough fluid to keep your urine pale yellow. This helps to keep you from becoming dehydrated. Try to drink more clear fluids, such as water. Do not drink alcohol. Limit your caffeine intake if told to do so by your health care provider. Check ingredients and nutrition facts to see if a food or beverage contains caffeine. Limit your salt (sodium) intake if told to do so by your health care provider. Check ingredients and nutrition facts to see if a food or beverage contains sodium. Activity  Avoid making quick movements. Rise slowly from chairs and steady yourself until you feel okay. In the morning, first sit up on the side of the bed. When you feel okay, stand slowly while you hold onto something until you know that your balance is good. If you need to stand in one place for a long time, move your legs often. Tighten and relax the muscles in your legs while you are standing. Do not drive or use machinery if you feel dizzy. Avoid bending down if you feel dizzy. Place items in your home so that they are easy for you to reach without leaning over. Lifestyle Do not use any products that contain nicotine or tobacco. These products include cigarettes, chewing tobacco, and vaping devices, such as e-cigarettes. If you need help quitting, ask your health care provider. Try to reduce your stress level by using methods such as yoga or meditation. Talk with your health care provider if you need help to manage your stress. General instructions Watch your dizziness for any changes. Take  over-the-counter and prescription medicines only as told by your health care provider. Talk with your health care provider if you think that your dizziness is caused by a medicine that you are taking. Tell a friend or a family member that you are feeling dizzy. If he or she notices any changes in your behavior, have this person call your health care provider. Keep all follow-up visits. This is important. Contact a health care provider if: Your dizziness does not go away or you have new symptoms. Your dizziness or light-headedness gets worse. You feel nauseous. You have reduced hearing. You have a fever. You have neck pain or a stiff neck. Your dizziness leads to an injury or a fall. Get help right away if: You vomit or have diarrhea and are unable to eat or drink anything. You have problems talking, walking, swallowing, or using your arms, hands, or legs. You feel generally weak. You have any bleeding. You are not thinking clearly or you have trouble forming sentences. It may take a friend or family member to notice this. You have chest pain, abdominal pain, shortness of breath, or sweating. Your vision changes or you develop a severe headache. These symptoms may represent a serious problem that is an emergency. Do not wait to see if the symptoms will go away. Get medical help right away. Call your local emergency services (911 in the [place] Do not drive yourself to the hospital. Summary Dizziness is a feeling of unsteadiness or light-headedness. This condition can be caused by a number of   things, including medicines, dehydration, or illness. Anyone can become dizzy, but dizziness is more common in older adults. Drink enough fluid to keep your urine pale yellow. Do not drink alcohol. Avoid making quick movements if you feel dizzy. Monitor your dizziness for any changes. This information is not intended to replace advice given to you by your health care provider. Make sure you discuss any  questions you have with your health care provider. Document Revised: 05/05/2020 Document Reviewed: 05/05/2020 Elsevier Patient Education  2022 [employer]  

## 2021-02-05 ENCOUNTER — Encounter: Payer: Self-pay | Admitting: Neurology

## 2021-02-05 LAB — TSH: TSH: 3.13 u[IU]/mL (ref 0.450–4.500)

## 2021-02-05 LAB — MAGNESIUM: Magnesium: 2 mg/dL (ref 1.6–2.3)

## 2021-02-05 LAB — LIPID PANEL
Chol/HDL Ratio: 3.3 ratio (ref 0.0–5.0)
Cholesterol, Total: 116 mg/dL (ref 100–199)
HDL: 35 mg/dL — ABNORMAL LOW (ref >39)
LDL Chol Calc (NIH): 51 mg/dL (ref 0–99)
Triglycerides: 184 mg/dL — ABNORMAL HIGH (ref 0–149)
VLDL Cholesterol Cal: 30 mg/dL (ref 5–40)

## 2021-02-11 ENCOUNTER — Ambulatory Visit: Payer: PPO | Admitting: Family Medicine

## 2021-02-12 ENCOUNTER — Ambulatory Visit (INDEPENDENT_AMBULATORY_CARE_PROVIDER_SITE_OTHER): Payer: PPO | Admitting: Family Medicine

## 2021-02-12 ENCOUNTER — Encounter: Payer: Self-pay | Admitting: Family Medicine

## 2021-02-12 ENCOUNTER — Other Ambulatory Visit: Payer: Self-pay

## 2021-02-12 VITALS — BP 117/64 | HR 90 | Temp 98.1°F | Resp 20 | Ht 71.0 in | Wt 195.0 lb

## 2021-02-12 DIAGNOSIS — I776 Arteritis, unspecified: Secondary | ICD-10-CM | POA: Diagnosis not present

## 2021-02-12 DIAGNOSIS — M316 Other giant cell arteritis: Secondary | ICD-10-CM | POA: Diagnosis not present

## 2021-02-12 DIAGNOSIS — R42 Dizziness and giddiness: Secondary | ICD-10-CM | POA: Diagnosis not present

## 2021-02-12 MED ORDER — PREDNISONE 5 MG PO TABS: 5.0000 mg | ORAL_TABLET | Freq: Every day | ORAL | 2 refills | Status: DC

## 2021-02-12 MED ORDER — CARVEDILOL 6.25 MG PO TABS: 6.2500 mg | ORAL_TABLET | Freq: Two times a day (BID) | ORAL | 2 refills | Status: DC

## 2021-02-12 NOTE — Progress Notes (Signed)
Subjective:  Patient ID: Ronald Russell., male    DOB: 05-09-1935  Age: 86 y.o. MRN: [id number]  CC: Follow up dizziness and headache   HPI [name] presents for continued dizziness. Walking feels like he is falling in a hole going "Bam, Bam, Bam." Feels top heavy. Staggers. No vertigo. Ongoing several weeks. No focal weakness or numbness.   Depression screen [facility] 2/9 02/12/2021 02/04/2021 01/08/2021  Decreased Interest 0 0 0  Down, Depressed, Hopeless 0 0 0  PHQ - 2 Score 0 0 0  Altered sleeping 0 0 -  Tired, decreased energy 0 0 -  Change in appetite 0 0 -  Feeling bad or failure about yourself  0 0 -  Trouble concentrating 0 0 -  Moving slowly or fidgety/restless 0 0 -  Suicidal thoughts 0 0 -  PHQ-9 Score 0 0 -  Difficult doing work/chores Not difficult at all Not difficult at all -  Some recent data might be hidden    History Rolan has a past medical history of Allergy, Anxiety, Bladder cancer ([place]) (2010), CAD (coronary artery disease), Chronic bronchitis ([place]), Dysrhythmia, GERD (gastroesophageal reflux disease), History of blood transfusion (1949), HOH (hard of hearing), Hypertension, Ocular migraine, Pneumonia, Seasonal allergies, Shingles, Temporal arteritis ([place]), and Thrombocytopenia ([place]) (11/29/2016).   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   His family history includes Alzheimer's disease in his father; Cancer in his mother, sister, and sister.He reports that he has quit smoking.  His smoking use included cigarettes. He has a 2.25 pack-year smoking history. He has never used smokeless tobacco. He reports that he does not drink alcohol and does not use drugs.    ROS Review of Systems  Constitutional:  Negative for fever.  Respiratory:  Negative for shortness of breath.   Cardiovascular:  Negative for chest pain.  Musculoskeletal:  Negative for arthralgias.  Skin:  Negative for rash.   Objective:  BP 117/64   Pulse 90   Temp 98.1 F (36.7 C) (Temporal)   Resp 20   Ht _0  (1.803 m)   Wt 195 lb (88.5 kg)   SpO2 96%   BMI 27.20 kg/m   BP Readings from Last 3 Encounters:  02/12/21 117/64  02/04/21 134/66  01/27/21 (!) 108/54    Wt Readings from Last 3 Encounters:  02/12/21 195 lb (88.5 kg)  02/04/21 195 lb (88.5 kg)  01/27/21 194 lb 6.4 oz (88.2 kg)     Physical Exam Vitals reviewed.  Constitutional:      Appearance: He is well-developed.  HENT:     Head: Normocephalic and atraumatic.     Right Ear: External ear normal.     Left Ear: External ear normal.     Mouth/Throat:     Pharynx: No oropharyngeal exudate or posterior oropharyngeal erythema.  Eyes:     Pupils: Pupils are equal, round, and reactive to light.  Cardiovascular:     Rate and Rhythm: Normal rate and regular rhythm.     Heart sounds: No murmur heard. Pulmonary:     Effort: No respiratory distress.     Breath  sounds: Normal breath sounds.  Musculoskeletal:     Cervical back: Normal range of motion and neck supple.  Neurological:     General: No focal deficit present.     Mental Status: He is alert and oriented to person, place, and time.     Cranial Nerves: No cranial nerve deficit.     Coordination: Coordination normal.     Gait: Gait normal.  Psychiatric:        Mood and Affect: Mood normal.      Assessment & Plan:   [name] was seen today for follow up dizziness and headache.  Diagnoses and all orders for this visit:  Arteritis ([place]) -     Sedimentation  rate -     CBC with Differential/Platelet -     CMP14+EGFR -     C-reactive protein  Temporal arteritis ([place]) -     Ambulatory referral to Neurology -     Sedimentation rate -     CBC with Differential/Platelet -     CMP14+EGFR -     C-reactive protein  Dizziness -     Ambulatory referral to Neurology -     predniSONE (DELTASONE) 5 MG tablet; Take 1 tablet (5 mg total) by mouth daily with breakfast. -     CBC with Differential/Platelet -     CMP14+EGFR -     C-reactive protein  Other orders -     carvedilol (COREG) 6.25 MG tablet; Take 1 tablet (6.25 mg total) by mouth 2 (two) times daily with a meal.      I have discontinued [name] "RON"'s diltiazem, cloNIDine, and atenolol. I have also changed his predniSONE. Additionally, I am having him start on carvedilol. Lastly, I am having him maintain his aspirin EC, Potassium Gluconate, acetaminophen, Vitamin D (Cholecalciferol), sildenafil, acidophilus, Cyanocobalamin (VITAMIN B-12 PO), lansoprazole, promethazine, Repatha SureClick, psyllium, LORazepam, fluocinonide cream, and amLODipine.  Allergies as of 02/12/2021       Reactions   Uloric [febuxostat] Palpitations, Other (See Comments), Hypertension   Hypertension with palpitations and a sense of fuzziness and dizziness at the left side of his head   Doxazosin Diarrhea   Doxycycline Diarrhea   Omeprazole Other (See Comments)   Pantoprazole Other (See Comments)   Can tolerate 23m   Augmentin [amoxicillin-pot Clavulanate] Diarrhea   Ciprofloxacin Diarrhea   Codeine Nausea And Vomiting, Other (See Comments)   Severe headache   Levaquin [levofloxacin] Diarrhea   Oxycodone Nausea And Vomiting   Tramadol Nausea And Vomiting   Vicodin [hydrocodone-acetaminophen] Nausea And Vomiting        Medication List        Accurate as of February 12, 2021 11:59 PM. If you have any questions, ask your nurse or doctor.          STOP taking these medications     atenolol 12.5 mg Tabs tablet Commonly known as: TENORMIN Stopped by: WClaretta Fraise MD   cloNIDine 0.1 MG tablet Commonly known as: Catapres Stopped by: WClaretta Fraise MD   diltiazem 30 MG tablet Commonly known as: CARDIZEM Stopped by: WClaretta Fraise MD       TAKE these medications    acetaminophen 650 MG CR tablet Commonly known as: TYLENOL Take 650 mg by mouth every 6 (six) hours as needed for pain.   acidophilus Caps capsule Take 1 capsule by mouth every evening.   amLODipine 2.5 MG tablet Commonly known as: NORVASC Take 1 tablet (2.5 mg total) by mouth daily.  aspirin EC 81 MG tablet Take 1 tablet (81 mg total) by mouth daily.   carvedilol 6.25 MG tablet Commonly known as: Coreg Take 1 tablet (6.25 mg total) by mouth 2 (two) times daily with a meal. Started by: Claretta Fraise, MD   fluocinonide cream 0.05 % Commonly known as: LIDEX Apply 1 application topically 2 (two) times daily.   lansoprazole 15 MG capsule Commonly known as: PREVACID Take 15 mg by mouth at bedtime.   LORazepam 0.5 MG tablet Commonly known as: Ativan Take 1 tablet (0.5 mg total) by mouth 2 (two) times daily as needed for anxiety.   Potassium Gluconate 550 MG Tabs Take 1 tablet (550 mg total) by mouth at bedtime. Need to take 1 tab one day and 2 the next day alternating   predniSONE 5 MG tablet Commonly known as: DELTASONE Take 1 tablet (5 mg total) by mouth daily with breakfast. What changed: medication strength Changed by: Claretta Fraise, MD   promethazine 12.5 MG tablet Commonly known as: PHENERGAN Take 1 tablet (12.5 mg total) by mouth every 8 (eight) hours as needed for nausea or vomiting.   psyllium 58.6 % powder Commonly known as: METAMUCIL Take 1 packet by mouth daily.   Repatha SureClick 206 MG/ML [place] Generic drug: Evolocumab Inject 1 pen into the skin every 14 (fourteen) days.   sildenafil 20 MG tablet Commonly known as: REVATIO Take 1 tablet (20 mg total) by  mouth daily as needed (2-5 as needed).   VITAMIN B-12 PO Take 1 tablet by mouth daily.   Vitamin D (Cholecalciferol) 10 MCG (400 UNIT) Caps Take 400 Units by mouth daily.         Follow-up: No follow-ups on file.  Claretta Fraise, M.D.

## 2021-02-13 ENCOUNTER — Telehealth: Payer: Self-pay | Admitting: Internal Medicine

## 2021-02-13 LAB — C-REACTIVE PROTEIN: CRP: <1 mg/L (ref 0–10)

## 2021-02-13 LAB — CMP14+EGFR
ALT: 18 IU/L (ref 0–44)
AST: 16 IU/L (ref 0–40)
Albumin/Globulin Ratio: 2 (ref 1.2–2.2)
Albumin: 4.3 g/dL (ref 3.6–4.6)
Alkaline Phosphatase: 58 IU/L (ref 44–121)
BUN/Creatinine Ratio: 14 (ref 10–24)
BUN: 18 mg/dL (ref 8–27)
Bilirubin Total: 0.5 mg/dL (ref 0.0–1.2)
CO2: 24 mmol/L (ref 20–29)
Calcium: 9.2 mg/dL (ref 8.6–10.2)
Chloride: 102 mmol/L (ref 96–106)
Creatinine, Ser: 1.28 mg/dL — ABNORMAL HIGH (ref 0.76–1.27)
Globulin, Total: 2.2 g/dL (ref 1.5–4.5)
Glucose: 101 mg/dL — ABNORMAL HIGH (ref 65–99)
Potassium: 4.1 mmol/L (ref 3.5–5.2)
Sodium: 139 mmol/L (ref 134–144)
Total Protein: 6.5 g/dL (ref 6.0–8.5)
eGFR: 55 mL/min/{1.73_m2} — ABNORMAL LOW (ref >59)

## 2021-02-13 LAB — CBC WITH DIFFERENTIAL/PLATELET
Basophils Absolute: 0.1 10*3/uL (ref 0.0–0.2)
Basos: 1 %
EOS (ABSOLUTE): 0.2 10*3/uL (ref 0.0–0.4)
Eos: 2 %
Hematocrit: 43.8 % (ref 37.5–51.0)
Hemoglobin: 14.7 g/dL (ref 13.0–17.7)
Immature Grans (Abs): 0 10*3/uL (ref 0.0–0.1)
Immature Granulocytes: 0 %
Lymphocytes Absolute: 1.1 10*3/uL (ref 0.7–3.1)
Lymphs: 14 %
MCH: 29.8 pg (ref 26.6–33.0)
MCHC: 33.6 g/dL (ref 31.5–35.7)
MCV: 89 fL (ref 79–97)
Monocytes Absolute: 0.7 10*3/uL (ref 0.1–0.9)
Monocytes: 9 %
Neutrophils Absolute: 5.8 10*3/uL (ref 1.4–7.0)
Neutrophils: 74 %
Platelets: 140 10*3/uL — ABNORMAL LOW (ref 150–450)
RBC: 4.93 x10E6/uL (ref 4.14–5.80)
RDW: 15.2 % (ref 11.6–15.4)
WBC: 7.8 10*3/uL (ref 3.4–10.8)

## 2021-02-13 LAB — SEDIMENTATION RATE: Sed Rate: 9 mm/hr (ref 0–30)

## 2021-02-13 NOTE — Telephone Encounter (Signed)
Routing to Dr. Harrington Challenger as pt is asking for her input.  He is scheduled to see Dr. Harrington Challenger in office on 02/18/21.

## 2021-02-13 NOTE — Telephone Encounter (Signed)
Pt c/o medication issue:  1. Name of Medication: atenolol (TENORMIN) 12.5 mg TABS tablet  // carvedilol (COREG) 6.25 MG tablet  2. How are you currently taking this medication (dosage and times per day)? Take 12.5 mg by mouth. //  Take 1 tablet (6.25 mg total) by mouth 2 (two) times daily with a meal.  3. Are you having a reaction (difficulty breathing--STAT)? no  4. What is your medication issue?   pt states that pcp stopped his atenolol (TENORMIN) 12.5 mg TABS tablet and started him on carvedilol (COREG) 6.25 MG tablet.. pt would like to know if this is something Dr. Harrington Challenger agrees w/ please advise.

## 2021-02-16 ENCOUNTER — Encounter: Payer: Self-pay | Admitting: Family Medicine

## 2021-02-16 NOTE — Progress Notes (Signed)
Cardiology Office Note:    Date:  02/19/2021   ID:  Ronald Fear Sr., DOB July 26, 1934, MRN [id number]  PCP:  Claretta Fraise, MD  Cardiologist:  Dorris Carnes, MD   Electrophysiologist:  None   Referring MD: Claretta Fraise, MD   F/U of CAD     History of Present Illness:    Ronald Vickerman. is a 86 y.o. male with coronary artery disease (s/p CABG in June 2019; postop with atrial fibrillation)   Holter monitor in Sept 2019 shows NSR, occasional PACs/PVCs  Again in Aug 2020 SR with PACs  No afib   Echo in July 2020 LVEF normal   He has had palpitations    Adjustment of meds limited by bradycardia   The pt also has a hx of HTN, HL bladder cancer, temporal arteritis, hypertension, hyperlipidemia  (patient intolerant to statins) The pt has had a long hx of dizziness   Seen by ENT   No cause found   Has been to balance rehab    Pt seen by Sundra Aland in July 2022 for med recomm for cholesterol  Main complaints were dizziness  (extensive work up in past)  Agreed to trying [id number] The pt called in with headache  BP OK  Worried it was due to [place]  On 8/10 called  Atenolol held due to dizziness   BP high 175/105   Complained of chest pressure  Took atenolol and swimmy headed, unsteady   Went to ED 01/21/21 with CP and SOB and diaphoresis Blood pressure high  Pt dizzy   Pt set up for a lexiscan myoview which showed no ischemia   Continued on asa.   Atenolol stopped    Plan for PRN dilt    The pt called in on day of discharge and said he could not tolerate dilitazem  Hungover feeling   Tight in head  Lips cold    Called in 8/13  Wanted to try magnesium   Called in again   BP high   Wanted to go back on atenolol  I saw the pt on Jan 27, 2021    Since then he has called in multiple ties  Has been dizzy   has had palpitations     The pt says that when he he felt OK before taking his meds   Then he took his atenolol this morning and he felt bad after   Weak   hart racing    Denies CP    Prior CV  studies:   The following studies were reviewed today:  01/23/21  Defect 1: There is a medium defect of mild severity present in the basal inferior, mid inferior and apical inferior location. This is a low risk study. The left ventricular ejection fraction is hyperdynamic (>65%). Nuclear stress EF: 83%. There was no ST segment deviation noted during stress. No T wave inversion was noted during stress.   No evidence of ischemia. There is a medium defect of mild severity in the inferior wall, present at both rest and stress. Normal wall motion in this area. Given significant extracardiac activity in this area, suspect this is artifact. Low risk study. Change compared to prior study, but patient has had cardiac bypass surgery in the interim.    68 Hr Holter 02/2018 Sinus rhythm   51 to 106 bpm   Average HR 71 bpm Occasional PVC  Occasional PAC Longest pause 1.5 sec Diary entry associated with SR and also SR with  PAC  Pre CABG Dopplers 12/04/17  Final Interpretation: Right Carotid: The extracranial vessels were near-normal with only minimal wall thickening or plaque. Left Carotid: The extracranial vessels were near-normal with only minimal wall thickening or plaque. Vertebrals:  Bilateral vertebral arteries demonstrate antegrade flow. Subclavians: Normal flow hemodynamics were seen in bilateral subclavian arteries. Right Upper Extremity: Doppler waveforms remain within normal limits with compression. Doppler waveforms remain within normal limits with compression. Left Upper Extremity: Doppler waveforms remain within normal limits with compression. Doppler waveforms remain within normal limits with compression.   Cardiac Catheterization 12/02/17 LAD prox 75, mid 100 (R-L collats); D2 75 LCx prox 80, mid 70 RCA mild disease EF 55-65   Nuclear stress test 11/24/17 Intermediate risk stress nuclear study with a new area of mild ischemia in the distal LAD artery territory. Normal left  ventricular regional and global systolic function. EF 85   Event Monitor 08/2016 Sinus rhythm with PACs  Sensed as skips     Echo 09/01/16 EF 60-65, no RWMA, Gr 2 DD   Past Medical History:  Diagnosis Date   Allergy    Anxiety    Bladder cancer ([place]) 2010   Tx with BCG   CAD (coronary artery disease)    LHC 6/19: pLAD 75, mLAD 100, D2 75; pLCx 80, mLCx 70, EF 55-65 >> s/p CABG // Echo 3/18: EF 60-65, Gr 2 DD   Chronic bronchitis ([place])    "yearly the last 3 yrs" (02/13/2013)   Dysrhythmia    GERD (gastroesophageal reflux disease)    History of blood transfusion 1949   "seeral" (02/13/2013)   HOH (hard of hearing)    Hypertension    Ocular migraine    "not often" (02/13/2013)   Pneumonia    "last time ~ 2 yr ago; had it before that too" (02/13/2013)   Seasonal allergies    Shingles    has neuropathy since it happened in 6/17   Temporal arteritis ([facility])    Thrombocytopenia ([place]) 11/29/2016   Surgical Hx: The patient  has a past surgical history that includes Transurethral resection of bladder tumor (2010 X 3); mastoid tumor removed (Right, 1964); Tonsillectomy (1940's); Skin graft (Right, 1949); Colonoscopy; Vasectomy; Artery Biopsy (Left, 01/09/2013); Cardiovascular stress test (07/2011); Cholecystectomy (N/A, 11/30/2016); LEFT HEART CATH AND CORONARY ANGIOGRAPHY (N/A, 12/02/2017); Coronary artery bypass graft (N/A, 12/07/2017); TEE without cardioversion (N/A, 12/07/2017); Skin graft (Right); and Cystoscopy with biopsy (N/A, 04/21/2020).   Current Medications: Current Meds  Medication Sig   acetaminophen (TYLENOL) 650 MG CR tablet Take 650 mg by mouth every 6 (six) hours as needed for pain.   acidophilus (RISAQUAD) CAPS capsule Take 1 capsule by mouth every evening.   aspirin EC 81 MG tablet Take 1 tablet (81 mg total) by mouth daily.   Cyanocobalamin (VITAMIN B-12 PO) Take 1 tablet by mouth daily.   fluocinonide cream (LIDEX) 5.28 % Apply 1 application topically 2 (two) times daily.    lansoprazole (PREVACID) 15 MG capsule Take 15 mg by mouth at bedtime.   LORazepam (ATIVAN) 0.5 MG tablet Take 1 tablet (0.5 mg total) by mouth 2 (two) times daily as needed for anxiety.   Potassium Gluconate 550 MG TABS Take 1 tablet (550 mg total) by mouth at bedtime. Need to take 1 tab one day and 2 the next day alternating (Patient taking differently: Take 550 mg by mouth at bedtime.)   promethazine (PHENERGAN) 12.5 MG tablet Take 1 tablet (12.5 mg total) by mouth every 8 (eight) hours  as needed for nausea or vomiting.   psyllium (METAMUCIL) 58.6 % powder Take 1 packet by mouth daily.   sildenafil (REVATIO) 20 MG tablet Take 1 tablet (20 mg total) by mouth daily as needed (2-5 as needed).   Vitamin D, Cholecalciferol, 10 MCG (400 UNIT) CAPS Take 400 Units by mouth daily.     Allergies:   Uloric [febuxostat], Doxazosin, Doxycycline, Omeprazole, Pantoprazole, Augmentin [amoxicillin-pot clavulanate], Ciprofloxacin, Codeine, Levaquin [levofloxacin], Oxycodone, Tramadol, and Vicodin [hydrocodone-acetaminophen]   Social History   Tobacco Use   Smoking status: Former    Packs/day: 0.75    Years: 3.00    Pack years: 2.25    Types: Cigarettes   Smokeless tobacco: Never   Tobacco comments:    02/13/2013 "quit smoking age 7"  Vaping Use   Vaping Use: Never used  Substance Use Topics   Alcohol use: No    Comment: 02/13/2013 "probably a pint of whiskey/wk up til I was probably 86 yr old"   Drug use: No     Family Hx: The patient's family history includes Alzheimer's disease in his father; Cancer in his mother, sister, and sister. There is no history of Anemia, Arrhythmia, Asthma, Clotting disorder, Fainting, Heart attack, Heart disease, Heart failure, Hyperlipidemia, Hypertension, or Migraines.  ROS:   Please see the history of present illness.    All other systems reviewed and are negative.   EKGs/Labs/Other Test Reviewed:    EKG:   Sinus bradycardia 55 bpm  First degree AV block  RBBB  Recent Labs: 02/04/2021: Magnesium 2.0; TSH 3.130 02/12/2021: ALT 18; BUN 18; Creatinine, Ser 1.28; Hemoglobin 14.7; Platelets 140; Potassium 4.1; Sodium 139   Recent Lipid Panel Lab Results  Component Value Date/Time   CHOL 116 02/04/2021 02:39 PM   TRIG 184 (H) 02/04/2021 02:39 PM   HDL 35 (L) 02/04/2021 02:39 PM   CHOLHDL 3.3 02/04/2021 02:39 PM   CHOLHDL 3.3 12/02/2017 12:53 AM   LDLCALC 51 02/04/2021 02:39 PM    Physical Exam:    VS:  BP 116/60   Pulse (!) 56   Ht 5\' 11"  (1.803 m)   Wt 195 lb 3.2 oz (88.5 kg)   SpO2 97%   BMI 27.22 kg/m     Orthostatics:   BP 142/70  P 54   Sitting    118/60   P 71    Standing  116/60  P 59   Standing 4 mint   130/78   P 59   Wt Readings from Last 3 Encounters:  02/18/21 195 lb 3.2 oz (88.5 kg)  02/12/21 195 lb (88.5 kg)  02/04/21 195 lb (88.5 kg)   Gen:   Pt is in NAD  Neck:   JVP is normal    lungs:   Clear to ausculation  No rales   Cardiac RRR S1, S2  No S3    No signific murmurs  Abd   Supple   No hepatomegaly Nontender Ext  Triv LE   edema     2+ pulses    Neuro exam:  Deferred     EKG not done today  ASSESSMENT & PLAN:     Dizziness/ palpitations   Pt has called in many times with dizzines and palpitaitions    If he takes meds he says he's dizzy   If doesn't then BP is high   Tried diltiazem   Didn't tolerate   On low dose atenolol Today SBP 110s  HR is 50s    Orthostatics  he drops transiently then there is an increase in HR.  As goes on BP and HR back toward baseline   Recomm  I would hold atenolol for now   If BP is above 170 can take 1/2 of amlodipine    Follow  COnsider very low dose klonopin to even out HR  1   HTN  Difficult   Has been intolerant to many med trials    Labile as noted with change in position   Corrects    With High BP he would not tolerate anythng that would boost higher Discussed abdominal binder  Also to keep legs active   2  CAD   Pt s/p CABG in 2019    Myoview this recent admit showed no  ischemia  Chest prressure prob due to BP elevations   3 HL   Intolerant to statins  Repatha initiated  Need to confirm if taking       Medication Adjustments/Labs and Tests Ordered: Current medicines are reviewed at length with the patient today.  Concerns regarding medicines are outlined above.  Tests Ordered: Orders Placed This Encounter  Procedures   LONG TERM MONITOR (3-14 DAYS)    Medication Changes: No orders of the defined types were placed in this encounter.   Signed, Dorris Carnes, MD  02/19/2021 10:24 PM    [address], [place], [place]  [address] Phone: [phone]; Fax: [phone]

## 2021-02-16 NOTE — Progress Notes (Signed)
Hello Ronald Russell,  Your lab result is normal and/or stable.Some minor variations that are not significant are commonly marked abnormal, but do not represent any medical problem for you.  Best regards, Claretta Fraise, M.D.

## 2021-02-17 ENCOUNTER — Telehealth: Payer: Self-pay | Admitting: Internal Medicine

## 2021-02-17 NOTE — Telephone Encounter (Signed)
Patient c/o Palpitations:  High priority if patient c/o lightheadedness, shortness of breath, or chest pain  How long have you had palpitations/irregular HR/ Afib? Are you having the symptoms now? Since he got out of bed 9am, yes  Are you currently experiencing lightheadedness, SOB or CP? Lightheaded  Do you have a history of afib (atrial fibrillation) or irregular heart rhythm? yes  Have you checked your BP or HR? (document readings if available): 134/something HR 50's  Are you experiencing any other symptoms? no    Patient states he has been having skipping beats since he got up this morning at 9 am. He says he also feels "swimmy headed". He says he took atenolol but it has not helped.

## 2021-02-17 NOTE — Telephone Encounter (Signed)
Spoke with pt in regards to heart palpitations. He reports that he took atenolol 12.5 mg PO at 10 am with no improvement.  I informed him that atenolol isn't on his medication list.  He reports that he only trust Dr. Harrington Challenger and another MD ordered carvedilol in place of atenolol.  His BP 133/72- HR 55.  He reports that he feels as if he is going to die when he walks around; as long as he is lying down he feels ok.  I advised pt with his HR so low I can't have him take any more BB.  I advised him to call 911 to be evaluated.  He reports that he does not want to go to the ER.  I advised him that he could be evaluated (EKG) to determine heart rhythm.  He reports that he has an appointment with Dr. Harrington Challenger on 02/18/21 and doesn't feel that he can walk into the building without passing out.  I again advised him to call 911 to be evaluated.  He reports that a company called [employer] comes by to check on him; he will call [employer] and have them send someone to check on him.  I again advised him to call 911.  Per pt he will follow up with [employer].

## 2021-02-18 ENCOUNTER — Ambulatory Visit (INDEPENDENT_AMBULATORY_CARE_PROVIDER_SITE_OTHER): Payer: PPO

## 2021-02-18 ENCOUNTER — Ambulatory Visit: Payer: PPO | Admitting: Internal Medicine

## 2021-02-18 ENCOUNTER — Other Ambulatory Visit: Payer: Self-pay

## 2021-02-18 ENCOUNTER — Encounter: Payer: Self-pay | Admitting: Internal Medicine

## 2021-02-18 VITALS — BP 116/60 | HR 56 | Ht 71.0 in | Wt 195.2 lb

## 2021-02-18 DIAGNOSIS — R42 Dizziness and giddiness: Secondary | ICD-10-CM

## 2021-02-18 DIAGNOSIS — R002 Palpitations: Secondary | ICD-10-CM

## 2021-02-18 NOTE — Telephone Encounter (Signed)
error 

## 2021-02-18 NOTE — Progress Notes (Unsigned)
Applied a 14 day Zio XT monitor to patient in office

## 2021-02-18 NOTE — Patient Instructions (Addendum)
Medication Instructions:  Hold all medications for now. Keep a log of your blood pressure readings.  Check 3-4 times per day.  If BP >170 take 1/2 of amlodipine 2.5 mg (1.25 mg)   *If you need a refill on your cardiac medications before your next appointment, please call your pharmacy*   Lab Work: none   Testing/Procedures: 14 day zio patch monitor  ZIO XT- Long Term Monitor Instructions  Your physician has requested you wear a ZIO patch monitor for 14 days.  This is a single patch monitor. [employer] supplies one patch monitor per enrollment. Additional stickers are not available. Please do not apply patch if you will be having a Nuclear Stress Test,  Echocardiogram, Cardiac CT, MRI, or Chest Xray during the period you would be wearing the  monitor. The patch cannot be worn during these tests. You cannot remove and re-apply the  ZIO XT patch monitor.  Your ZIO patch monitor will be mailed 3 day USPS to your address on file. It may take 3-5 days  to receive your monitor after you have been enrolled.  Once you have received your monitor, please review the enclosed instructions. Your monitor  has already been registered assigning a specific monitor serial # to you.  Billing and Patient Assistance Program Information  We have supplied [employer] with any of your insurance information on file for billing purposes. [employer] offers a sliding scale Patient Assistance Program for patients that do not have  insurance, or whose insurance does not completely cover the cost of the ZIO monitor.  You must apply for the Patient Assistance Program to qualify for this discounted rate.  To apply, please call [employer] at [phone], select option 4, select option 2, ask to apply for  Patient Assistance Program. Theodore Demark will ask your household income, and how many people  are in your household. They will quote your out-of-pocket cost based on that information.  [employer] will also be able to set up a  52-month, interest-free payment plan if needed.  Applying the monitor   Shave hair from upper left chest.  Hold abrader disc by orange tab. Rub abrader in 40 strokes over the upper left chest as  indicated in your monitor instructions.  Clean area with 4 enclosed alcohol pads. Let dry.  Apply patch as indicated in monitor instructions. Patch will be placed under collarbone on left  side of chest with arrow pointing upward.  Rub patch adhesive wings for 2 minutes. Remove white label marked "1". Remove the white  label marked "2". Rub patch adhesive wings for 2 additional minutes.  While looking in a mirror, press and release button in center of patch. A small green light will  flash 3-4 times. This will be your only indicator that the monitor has been turned on.  Do not shower for the first 24 hours. You may shower after the first 24 hours.  Press the button if you feel a symptom. You will hear a small click. Record Date, Time and  Symptom in the Patient Logbook.  When you are ready to remove the patch, follow instructions on the last 2 pages of Patient  Logbook. Stick patch monitor onto the last page of Patient Logbook.  Place Patient Logbook in the blue and white box. Use locking tab on box and tape box closed  securely. The blue and white box has prepaid postage on it. Please place it in the mailbox as  soon as possible. Your physician should have your test  results approximately 7 days after the  monitor has been mailed back to [facility].  Call [place] at [phone] if you have questions regarding  your ZIO XT patch monitor. Call them immediately if you see an orange light blinking on your  monitor.  If your monitor falls off in less than 4 days, contact our Monitor department at [phone].  If your monitor becomes loose or falls off after 4 days call [employer] at [phone] for  suggestions on securing your monitor  Follow-Up: Follow up with your  physician will depend on test results.

## 2021-02-19 ENCOUNTER — Telehealth: Payer: Self-pay | Admitting: Internal Medicine

## 2021-02-19 NOTE — Telephone Encounter (Signed)
Pt c/o BP issue: STAT if pt c/o blurred vision, one-sided weakness or slurred speech  1. What are your last 5 BP readings? 125/64 , 133/69, 165/79, 137/80, 153/75  2. Are you having any other symptoms (ex. Dizziness, headache, blurred vision, passed out)? Headache   3. What is your BP issue?   Blood pressure is really high and heart is skipping beats.Marland Kitchen goes down when sitting and rises when standing, also is experiencing anxiety.   Patient c/o Palpitations:  High priority if patient c/o lightheadedness, shortness of breath, or chest pain  How long have you had palpitations/irregular HR/ Afib? Are you having the symptoms now? Yes   Are you currently experiencing lightheadedness, SOB or CP? No   Do you have a history of afib (atrial fibrillation) or irregular heart rhythm? yes  Have you checked your BP or HR? (document readings if available):  59, 57, 65, 66, 64   Are you experiencing any other symptoms? no

## 2021-02-20 ENCOUNTER — Telehealth: Payer: Self-pay | Admitting: Internal Medicine
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
## 2021-02-20 NOTE — Telephone Encounter (Signed)
Patient c/o Palpitations:  High priority if patient c/o lightheadedness, shortness of breath, or chest pain  How long have you had palpitations/irregular HR/ Afib? Are you having the symptoms now? yes  Are you currently experiencing lightheadedness, SOB or CP? Sob, lightheadness, feeling passing out, dizziness  Do you have a history of afib (atrial fibrillation) or irregular heart rhythm? no  Have you checked your BP or HR? (document readings if available): no  Are you experiencing any other symptoms? no

## 2021-02-20 NOTE — Telephone Encounter (Signed)
Spoke with pt, he reports that he feels lightheaded, like he is going to pass out, and his heart skipping around pretty bad.  Per pt he is currently wearing heart monitor.  He was sitting on screened in porch and could barely walk into the house.  He reports that his PO intake is adequate.  He has not taken any BP meds since Wednesday. BP today: 146/74-71.  I advised pt to call 911 to be evaluated.  He reports that he is not going to call 911 he only trust Dr. Harrington Challenger and Caren Hazy, RN.  I again advised him that if he feels bad to call 911.    Will route to MD and RN.

## 2021-02-21 ENCOUNTER — Telehealth: Payer: Self-pay | Admitting: Student in an Organized Health Care Education/Training Program

## 2021-02-21 NOTE — Telephone Encounter (Signed)
Paged by operator with patient question related to atenolol. Been off since Wed AM by Dr. Harrington Challenger. Reviewed notes. Reports same sx without atenolol and was told "neurological issue by Rodman Key" yesterday. On atenolol 12.5 mg PO daily when he does take it. BP 151/79, HR 50-70s. Having ongoing palpitations. Usually with atenolol 57-59 per his report. Now feels his heart is skipping out of his chest. Also still feeling "swimmy headed" despite being off atenolol for 3 days. Has tried multiple meds for palpitations including diltiazem and metoprolol with various side effects. Explained that atenolol may cause "swimmy headedness" but may help with palpitations. Has also been prescribed coreg as well in the past.   He will try to take low dose atenolol tonight (12.5 mg PO once nightly) and see if any improvement in palpitations. When he was taking in morning he had some of the lightheadedness more pronounced in morning than afternoon times so maybe switching this to night would be somewhat helpful. Not going to recommended a different BB since most of them have been trialed already. Could consider bisoprolol if issues ongoing with atenolol given side effect profile. For now will just retrial and see if improvement.

## 2021-02-22 ENCOUNTER — Other Ambulatory Visit: Payer: Self-pay | Admitting: Home Health

## 2021-02-28 ENCOUNTER — Other Ambulatory Visit: Payer: Self-pay | Admitting: Internal Medicine

## 2021-03-04 ENCOUNTER — Telehealth: Payer: Self-pay | Admitting: Internal Medicine

## 2021-03-04 NOTE — Telephone Encounter (Signed)
Pt called on call last week and was instructed to retry atenolol 12.5 mg   Per pt "I read that atenolol stays in your body for 32 hrs. I went 4 days without that atenolol and still had my swimmy headedness.  Just not quite as bad. I don't believe the atenolol is causing it"  Has appointments arranged:   Tomorrow ENT w [facility]  05/05/21 neurologist   Temple and back of head get numb. I cant walk and look up.  I have to look down or I'll fall.  Left ear has been hurting a little bit.   He has taken off the monitor and sent back today.  Pt wants to take the 1/2 tab tonight 8:00 pm and take 1/4 tab in the morning if his heart is skipping bad. I adv he is safe to do this and that we will contact him when report reviewed by Dr. Harrington Challenger.

## 2021-03-04 NOTE — Telephone Encounter (Signed)
Patient c/o Palpitations:  High priority if patient c/o lightheadedness, shortness of breath, or chest pain  How long have you had palpitations/irregular HR/ Afib? Are you having the symptoms now?  Patient states his he has been having palpitations for about 1 week. States he is currently having the palpitations.  Are you currently experiencing lightheadedness, SOB or CP?  No   Do you have a history of afib (atrial fibrillation) or irregular heart rhythm?  Yes   Have you checked your BP or HR? (document readings if available):  127/65  60  Are you experiencing any other symptoms?  No

## 2021-03-10 ENCOUNTER — Other Ambulatory Visit: Payer: Self-pay | Admitting: Family Medicine

## 2021-03-10 DIAGNOSIS — R002 Palpitations: Secondary | ICD-10-CM | POA: Diagnosis not present

## 2021-03-10 DIAGNOSIS — K219 Gastro-esophageal reflux disease without esophagitis: Secondary | ICD-10-CM

## 2021-03-10 DIAGNOSIS — R42 Dizziness and giddiness: Secondary | ICD-10-CM | POA: Diagnosis not present

## 2021-03-17 ENCOUNTER — Telehealth: Payer: Self-pay | Admitting: Internal Medicine

## 2021-03-17 NOTE — Telephone Encounter (Signed)
Pt c/o medication issue:  1. Name of Medication: Evolocumab (REPATHA SURECLICK) 361 MG/ML SOAJ   2. How are you currently taking this medication (dosage and times per day)? No currently taking   3. Are you having a reaction (difficulty breathing--STAT)? no  4. What is your medication issue? pt says that repatha was making his heart skip a beat pt took atenonol to control heart rhythm.. changed the way he takes atenonol and he is doing better would like to know what you advise in regards to continuing the repatha

## 2021-03-17 NOTE — Telephone Encounter (Signed)
Called pt in regards to [place].  Per pt he took repatha shortly after it was prescribed on 01/06/21 and the next day his heart began to skip beats. He is taking atenolol 6.25 mg PO BID. He reports that he is no longer having skipped heart beats.   I advised him that atenolol is not on his medication list.  He says he was told to start back taking atenolol to control skipped heart beats.  He continues to have numbness in his head is scheduled with neurology 04/23/21. But overall he feels a lot better.  He would like to know when or if he should resume Repatha.

## 2021-03-17 NOTE — Telephone Encounter (Signed)
I do not think the Repatha caused his arrhythmia. He should re-try Repatha now that his HR is under better control.

## 2021-03-17 NOTE — Telephone Encounter (Signed)
Called pt, reviewed pharmacist recommendation that he restart Repatha.  He is not agreeable to start until Dr. Harrington Challenger gives him the okay.  I advised him that our pharmacist have the ability to ok medication.  He expressed that he would rather wait for Dr. Harrington Challenger.  Will route to Dr. Harrington Challenger and RN to address.

## 2021-03-19 MED ORDER — ATENOLOL 25 MG PO TABS: ORAL_TABLET | ORAL | 3 refills | Status: DC

## 2021-03-19 NOTE — Telephone Encounter (Signed)
I updated medication list to reflect pt no longer on coreg and back on atenolol 12.5 mg daily with additional 1/4 to 1/2 tablet as needed daily.    This was initiated by on call provided while pt was wearing monitor.

## 2021-03-19 NOTE — Telephone Encounter (Signed)
Per Dr. Harrington Challenger.. I agree   I think that pt should retry Repatha    Pt advised and will let us know how he is doing going back on the [place].

## 2021-03-26 ENCOUNTER — Ambulatory Visit (INDEPENDENT_AMBULATORY_CARE_PROVIDER_SITE_OTHER): Payer: PPO | Admitting: Family Medicine

## 2021-03-26 ENCOUNTER — Encounter: Payer: Self-pay | Admitting: Family Medicine

## 2021-03-26 ENCOUNTER — Other Ambulatory Visit: Payer: Self-pay

## 2021-03-26 VITALS — BP 108/63 | HR 54 | Temp 96.4°F | Ht 71.0 in | Wt 200.2 lb

## 2021-03-26 DIAGNOSIS — R197 Diarrhea, unspecified: Secondary | ICD-10-CM | POA: Diagnosis not present

## 2021-03-26 NOTE — Patient Instructions (Signed)
Diarrhea, Adult Diarrhea is frequent loose and watery bowel movements. Diarrhea can make you feel weak and cause you to become dehydrated. Dehydration can make you tiredand thirsty, cause you to have a dry mouth, and decrease how often you urinate. Diarrhea typically lasts 2-3 days. However, it can last longer if it is a sign of something more serious. It is important to treat your diarrhea as told byyour health care provider. Follow these instructions at home: Eating and drinking     Follow these recommendations as told by your health care provider: Take an oral rehydration solution (ORS). This is an over-the-counter medicine that helps return your body to its normal balance of nutrients and water. It is found at pharmacies and retail stores. Drink plenty of fluids, such as water, ice chips, diluted fruit juice, and low-calorie sports drinks. You can drink milk also, if desired. Avoid drinking fluids that contain a lot of sugar or caffeine, such as energy drinks, sports drinks, and soda. Eat bland, easy-to-digest foods in small amounts as you are able. These foods include bananas, applesauce, rice, lean meats, toast, and crackers. Avoid alcohol. Avoid spicy or fatty foods.  Medicines Take over-the-counter and prescription medicines only as told by your health care provider. If you were prescribed an antibiotic medicine, take it as told by your health care provider. Do not stop using the antibiotic even if you start to feel better. General instructions  Wash your hands often using soap and water. If soap and water are not available, use a hand sanitizer. Others in the household should wash their hands as well. Hands should be washed: After using the toilet or changing a diaper. Before preparing, cooking, or serving food. While caring for a sick person or while visiting someone in a hospital. Drink enough fluid to keep your urine pale yellow. Rest at home while you recover. Watch your  condition for any changes. Take a warm bath to relieve any burning or pain from frequent diarrhea episodes. Keep all follow-up visits as told by your health care provider. This is important.  Contact a health care provider if: You have a fever. Your diarrhea gets worse. You have new symptoms. You cannot keep fluids down. You feel light-headed or dizzy. You have a headache. You have muscle cramps. Get help right away if: You have chest pain. You feel extremely weak or you faint. You have bloody or black stools or stools that look like tar. You have severe pain, cramping, or bloating in your abdomen. You have trouble breathing or you are breathing very quickly. Your heart is beating very quickly. Your skin feels cold and clammy. You feel confused. You have signs of dehydration, such as: Dark urine, very little urine, or no urine. Cracked lips. Dry mouth. Sunken eyes. Sleepiness. Weakness. Summary Diarrhea is frequent loose and watery bowel movements. Diarrhea can make you feel weak and cause you to become dehydrated. Drink enough fluids to keep your urine pale yellow. Make sure that you wash your hands after using the toilet. If soap and water are not available, use hand sanitizer. Contact a health care provider if your diarrhea gets worse or you have new symptoms. Get help right away if you have signs of dehydration. This information is not intended to replace advice given to you by your health care provider. Make sure you discuss any questions you have with your healthcare provider. Document Revised: 10/17/2018 Document Reviewed: 11/04/2017 Elsevier Patient Education  2022 [employer]  
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
## 2021-03-27 DIAGNOSIS — Z8551 Personal history of malignant neoplasm of bladder: Secondary | ICD-10-CM | POA: Diagnosis not present

## 2021-04-09 ENCOUNTER — Ambulatory Visit (INDEPENDENT_AMBULATORY_CARE_PROVIDER_SITE_OTHER): Payer: PPO | Admitting: Otolaryngology

## 2021-04-09 ENCOUNTER — Other Ambulatory Visit: Payer: Self-pay

## 2021-04-09 DIAGNOSIS — H903 Sensorineural hearing loss, bilateral: Secondary | ICD-10-CM | POA: Diagnosis not present

## 2021-04-09 DIAGNOSIS — H61301 Acquired stenosis of right external ear canal, unspecified: Secondary | ICD-10-CM
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
## 2021-04-23 ENCOUNTER — Ambulatory Visit: Payer: PPO | Admitting: Neurology

## 2021-04-23 ENCOUNTER — Other Ambulatory Visit: Payer: Self-pay

## 2021-04-23 ENCOUNTER — Ambulatory Visit (INDEPENDENT_AMBULATORY_CARE_PROVIDER_SITE_OTHER): Payer: PPO | Admitting: Family

## 2021-04-23 ENCOUNTER — Encounter: Payer: Self-pay | Admitting: Family

## 2021-04-23 VITALS — BP 135/67 | HR 55 | Temp 97.0°F | Ht 71.0 in | Wt 197.4 lb

## 2021-04-23 DIAGNOSIS — R197 Diarrhea, unspecified: Secondary | ICD-10-CM | POA: Diagnosis not present

## 2021-04-23 DIAGNOSIS — R1032 Left lower quadrant pain: Secondary | ICD-10-CM

## 2021-04-23 DIAGNOSIS — K5792 Diverticulitis of intestine, part unspecified, without perforation or abscess without bleeding: Secondary | ICD-10-CM

## 2021-04-23 MED ORDER — CIPROFLOXACIN HCL 500 MG PO TABS: 500.0000 mg | ORAL_TABLET | Freq: Two times a day (BID) | ORAL | 0 refills | Status: DC

## 2021-04-23 MED ORDER — METRONIDAZOLE 500 MG PO TABS: 500.0000 mg | ORAL_TABLET | Freq: Three times a day (TID) | ORAL | 0 refills | Status: DC

## 2021-04-23 NOTE — Progress Notes (Signed)
Subjective:    Patient ID: Ronald Morrow., male    DOB: 06/03/1935, 86 y.o.   MRN: [id number]  Chief Complaint  Patient presents with  . Diarrhea    3 mths bad stomach cramps    Pt presents to the office today with diarrhea and abdominal pain for the last 3 months.  Diarrhea  This is a new problem. The current episode started more than 1 month ago. The problem occurs 2 to 4 times per day. The problem has been waxing and waning. The stool consistency is described as Watery. Associated symptoms include abdominal pain, bloating and increased flatus. Pertinent negatives include no fever, headaches or vomiting. Associated symptoms comments: Nausea . He has tried anti-motility drug for the symptoms. The treatment provided mild relief.     Review of Systems  Constitutional:  Negative for fever.  Gastrointestinal:  Positive for abdominal pain, bloating, diarrhea and flatus. Negative for vomiting.  Neurological:  Negative for headaches.  All other systems reviewed and are negative.     Objective:   Physical Exam Vitals reviewed.  Constitutional:      General: He is not in acute distress.    Appearance: He is well-developed.  HENT:     Head: Normocephalic.     Right Ear: Tympanic membrane normal.     Left Ear: Tympanic membrane normal.  Eyes:     General:        Right eye: No discharge.        Left eye: No discharge.     Pupils: Pupils are equal, round, and reactive to light.  Neck:     Thyroid: No thyromegaly.  Cardiovascular:     Rate and Rhythm: Normal rate and regular rhythm.     Heart sounds: Normal heart sounds. No murmur heard. Pulmonary:     Effort: Pulmonary effort is normal. No respiratory distress.     Breath sounds: Normal breath sounds. No wheezing.  Abdominal:     General: Bowel sounds are normal. There is no distension.     Palpations: Abdomen is soft.     Tenderness: There is no abdominal tenderness (no tenderness).  Musculoskeletal:        General: No  tenderness. Normal range of motion.     Cervical back: Normal range of motion and neck supple.  Skin:    General: Skin is warm and dry.     Findings: No erythema or rash.  Neurological:     Mental Status: He is alert and oriented to person, place, and time.     Cranial Nerves: No cranial nerve deficit.     Deep Tendon Reflexes: Reflexes are normal and symmetric.  Psychiatric:        Behavior: Behavior normal.        Thought Content: Thought content normal.        Judgment: Judgment normal.      BP 135/67   Pulse (!) 55   Temp (!) 97 F (36.1 C) (Temporal)   Ht _0  (1.803 m)   Wt 197 lb 6.4 oz (89.5 kg)   BMI 27.53 kg/m      Assessment & Plan:  Ronald Fear Sr. comes in today with chief complaint of Diarrhea (3 mths bad stomach cramps )   Diagnosis and orders addressed:  1. Diarrhea, unspecified type - CMP14+EGFR - CBC with Differential/Platelet  2. Left lower quadrant abdominal pain - CMP14+EGFR - CBC with Differential/Platelet  3. Diverticulitis Force fluids  Criss Rosales  diet Start Cipro and Flagyl  Avoid red dyes Call if symptoms worsen or do not improve  - CMP14+EGFR - CBC with Differential/Platelet - metroNIDAZOLE (FLAGYL) 500 MG tablet; Take 1 tablet (500 mg total) by mouth 3 (three) times daily.  Dispense: 21 tablet; Refill: 0 - ciprofloxacin (CIPRO) 500 MG tablet; Take 1 tablet (500 mg total) by mouth 2 (two) times daily.  Dispense: 14 tablet; Refill: 0     Evelina Dun, FNP

## 2021-04-23 NOTE — Patient Instructions (Signed)
Diverticulitis Diverticulitis is infection or inflammation of small pouches (diverticula) in the colon that form due to a condition called diverticulosis. Diverticula can trap stool (feces) and bacteria, causing infection and inflammation. Diverticulitis may cause severe stomach pain and diarrhea. It may lead to tissue damage in the colon that causes bleeding or blockage. The diverticula may also burst (rupture) and cause infected stool to enter other areas of the abdomen. What are the causes? This condition is caused by stool becoming trapped in the diverticula, which allows bacteria to grow in the diverticula. This leads to inflammation and infection. What increases the risk? You are more likely to develop this condition if you have diverticulosis. The risk increases if you: Are overweight or obese. Do not get enough exercise. Drink alcohol. Use tobacco products. Eat a diet that has a lot of red meat such as beef, pork, or lamb. Eat a diet that does not include enough fiber. High-fiber foods include fruits, vegetables, beans, nuts, and whole grains. Are over 40 years of age. What are the signs or symptoms? Symptoms of this condition may include: Pain and tenderness in the abdomen. The pain is normally located on the left side of the abdomen, but it may occur in other areas. Fever and chills. Nausea. Vomiting. Cramping. Bloating. Changes in bowel routines. Blood in your stool. How is this diagnosed? This condition is diagnosed based on: Your medical history. A physical exam. Tests to make sure there is nothing else causing your condition. These tests may include: Blood tests. Urine tests. CT scan of the abdomen. How is this treated? Most cases of this condition are mild and can be treated at home. Treatment may include: Taking over-the-counter pain medicines. Following a clear liquid diet. Taking antibiotic medicines by mouth. Resting. More severe cases may need to be treated  at a hospital. Treatment may include: Not eating or drinking. Taking prescription pain medicine. Receiving antibiotic medicines through an IV. Receiving fluids and nutrition through an IV. Surgery. When your condition is under control, your health care provider may recommend that you have a colonoscopy. This is an exam to look at the entire large intestine. During the exam, a lubricated, bendable tube is inserted into the anus and then passed into the rectum, colon, and other parts of the large intestine. A colonoscopy can show how severe your diverticula are and whether something else may be causing your symptoms. Follow these instructions at home: Medicines Take over-the-counter and prescription medicines only as told by your health care provider. These include fiber supplements, probiotics, and stool softeners. If you were prescribed an antibiotic medicine, take it as told by your health care provider. Do not stop taking the antibiotic even if you start to feel better. Ask your health care provider if the medicine prescribed to you requires you to avoid driving or using machinery. Eating and drinking  Follow a full liquid diet or another diet as directed by your health care provider. After your symptoms improve, your health care provider may tell you to change your diet. He or she may recommend that you eat a diet that contains at least 25 grams (25 g) of fiber daily. Fiber makes it easier to pass stool. Healthy sources of fiber include: Berries. One cup contains 4-8 grams of fiber. Beans or lentils. One-half cup contains 5-8 grams of fiber. Green vegetables. One cup contains 4 grams of fiber. Avoid eating red meat. General instructions Do not use any products that contain nicotine or tobacco, such as cigarettes,   e-cigarettes, and chewing tobacco. If you need help quitting, ask your health care provider. Exercise for at least 30 minutes, 3 times each week. You should exercise hard enough to  raise your heart rate and break a sweat. Keep all follow-up visits as told by your health care provider. This is important. You may need to have a colonoscopy. Contact a health care provider if: Your pain does not improve. Your bowel movements do not return to normal. Get help right away if: Your pain gets worse. Your symptoms do not get better with treatment. Your symptoms suddenly get worse. You have a fever. You vomit more than one time. You have stools that are bloody, black, or tarry. Summary Diverticulitis is infection or inflammation of small pouches (diverticula) in the colon that form due to a condition called diverticulosis. Diverticula can trap stool (feces) and bacteria, causing infection and inflammation. You are at higher risk for this condition if you have diverticulosis and you eat a diet that does not include enough fiber. Most cases of this condition are mild and can be treated at home. More severe cases may need to be treated at a hospital. When your condition is under control, your health care provider may recommend that you have an exam called a colonoscopy. This exam can show how severe your diverticula are and whether something else may be causing your symptoms. Keep all follow-up visits as told by your health care provider. This is important. This information is not intended to replace advice given to you by your health care provider. Make sure you discuss any questions you have with your health care provider. Document Revised: 03/12/2019 Document Reviewed: 03/12/2019 [employer] Patient Education  2022 [employer]  

## 2021-04-24 LAB — CBC WITH DIFFERENTIAL/PLATELET
Basophils Absolute: 0.1 10*3/uL (ref 0.0–0.2)
Basos: 1 %
EOS (ABSOLUTE): 0.2 10*3/uL (ref 0.0–0.4)
Eos: 3 %
Hematocrit: 45.3 % (ref 37.5–51.0)
Hemoglobin: 15.1 g/dL (ref 13.0–17.7)
Immature Grans (Abs): 0 10*3/uL (ref 0.0–0.1)
Immature Granulocytes: 0 %
Lymphocytes Absolute: 1.3 10*3/uL (ref 0.7–3.1)
Lymphs: 23 %
MCH: 30.4 pg (ref 26.6–33.0)
MCHC: 33.3 g/dL (ref 31.5–35.7)
MCV: 91 fL (ref 79–97)
Monocytes Absolute: 0.7 10*3/uL (ref 0.1–0.9)
Monocytes: 12 %
Neutrophils Absolute: 3.4 10*3/uL (ref 1.4–7.0)
Neutrophils: 61 %
Platelets: 131 10*3/uL — ABNORMAL LOW (ref 150–450)
RBC: 4.96 x10E6/uL (ref 4.14–5.80)
RDW: 14.4 % (ref 11.6–15.4)
WBC: 5.7 10*3/uL (ref 3.4–10.8)

## 2021-04-24 LAB — CMP14+EGFR
ALT: 14 IU/L (ref 0–44)
AST: 18 IU/L (ref 0–40)
Albumin/Globulin Ratio: 2.3 — ABNORMAL HIGH (ref 1.2–2.2)
Albumin: 4.3 g/dL (ref 3.6–4.6)
Alkaline Phosphatase: 53 IU/L (ref 44–121)
BUN/Creatinine Ratio: 13 (ref 10–24)
BUN: 20 mg/dL (ref 8–27)
Bilirubin Total: 0.7 mg/dL (ref 0.0–1.2)
CO2: 24 mmol/L (ref 20–29)
Calcium: 9 mg/dL (ref 8.6–10.2)
Chloride: 105 mmol/L (ref 96–106)
Creatinine, Ser: 1.52 mg/dL — ABNORMAL HIGH (ref 0.76–1.27)
Globulin, Total: 1.9 g/dL (ref 1.5–4.5)
Glucose: 93 mg/dL (ref 70–99)
Potassium: 4.5 mmol/L (ref 3.5–5.2)
Sodium: 141 mmol/L (ref 134–144)
Total Protein: 6.2 g/dL (ref 6.0–8.5)
eGFR: 45 mL/min/{1.73_m2} — ABNORMAL LOW (ref >59)

## 2021-05-04 ENCOUNTER — Ambulatory Visit: Payer: PPO | Admitting: Neurology

## 2021-05-05 ENCOUNTER — Other Ambulatory Visit: Payer: Self-pay

## 2021-05-05 ENCOUNTER — Encounter: Payer: Self-pay | Admitting: Family Medicine

## 2021-05-05 ENCOUNTER — Ambulatory Visit (INDEPENDENT_AMBULATORY_CARE_PROVIDER_SITE_OTHER): Payer: PPO | Admitting: Family Medicine

## 2021-05-05 VITALS — BP 124/72 | HR 60 | Ht 71.0 in | Wt 199.0 lb

## 2021-05-05 DIAGNOSIS — L02212 Cutaneous abscess of back [any part, except buttock]: Secondary | ICD-10-CM

## 2021-05-05 MED ORDER — SULFAMETHOXAZOLE-TRIMETHOPRIM 800-160 MG PO TABS: 1.0000 | ORAL_TABLET | Freq: Two times a day (BID) | ORAL | 0 refills | Status: AC
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
## 2021-05-09 ENCOUNTER — Other Ambulatory Visit: Payer: Self-pay | Admitting: Family Medicine

## 2021-05-11 ENCOUNTER — Telehealth: Payer: Self-pay | Admitting: Family Medicine

## 2021-05-11 DIAGNOSIS — K22719 Barrett's esophagus with dysplasia, unspecified: Secondary | ICD-10-CM | POA: Insufficient documentation

## 2021-05-11 DIAGNOSIS — K2270 Barrett's esophagus without dysplasia: Secondary | ICD-10-CM | POA: Insufficient documentation

## 2021-05-11 NOTE — Telephone Encounter (Signed)
Okay 

## 2021-05-11 NOTE — Telephone Encounter (Signed)
Pt called to let Monia Pouch know that he is coming in to see her on Friday 12/2 but says he only has 2 days of antibiotics left and there is still puss coming from abscess. Wants to know what to do.

## 2021-05-15 ENCOUNTER — Ambulatory Visit (INDEPENDENT_AMBULATORY_CARE_PROVIDER_SITE_OTHER): Payer: PPO | Admitting: Family Medicine

## 2021-05-15 ENCOUNTER — Encounter: Payer: Self-pay | Admitting: Family Medicine

## 2021-05-15 VITALS — BP 133/67 | HR 58 | Temp 97.6°F | Ht 72.0 in | Wt 197.4 lb

## 2021-05-15 DIAGNOSIS — L02212 Cutaneous abscess of back [any part, except buttock]: Secondary | ICD-10-CM

## 2021-05-15 DIAGNOSIS — K644 Residual hemorrhoidal skin tags: Secondary | ICD-10-CM | POA: Diagnosis not present

## 2021-05-15 MED ORDER — HYDROCORTISONE ACETATE 25 MG RE SUPP: 25.0000 mg | Freq: Two times a day (BID) | RECTAL | 0 refills | Status: DC
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
## 2021-05-18 NOTE — Telephone Encounter (Signed)
Pt seen 05/15/21

## 2021-05-19 ENCOUNTER — Ambulatory Visit: Payer: PPO | Admitting: Family Medicine

## 2021-05-19 NOTE — Progress Notes (Deleted)
Cardiology Office Note:    Date:  05/19/2021   ID:  Ronald Fear Sr., DOB Aug 23, 1934, MRN [id number]  PCP:  Baruch Gouty, FNP  Cardiologist:  Dorris Carnes, MD   Electrophysiologist:  None   Referring MD: Claretta Fraise, MD   F/U of CAD     History of Present Illness:    Ronald Russell. is a 86 y.o. male with coronary artery disease (s/p CABG in June 2019; postop with atrial fibrillation)   Holter monitor in Sept 2019 shows NSR, occasional PACs/PVCs  Again in Aug 2020 SR with PACs  No afib   Echo in July 2020 LVEF normal   He has had palpitations    Adjustment of meds limited by bradycardia   The pt also has a hx of HTN, HL bladder cancer, temporal arteritis, hypertension, hyperlipidemia  (patient intolerant to statins) The pt has had a long hx of dizziness   Seen by ENT   No cause found   Has been to balance rehab    Pt seen by Sundra Aland in July 2022 for med recomm for cholesterol  Main complaints were dizziness  (extensive work up in past)  Agreed to trying [id number] The pt called in with headache  BP OK  Worried it was due to [place]  On 8/10 called  Atenolol held due to dizziness   BP high 175/105   Complained of chest pressure  Took atenolol and swimmy headed, unsteady   Went to ED 01/21/21 with CP and SOB and diaphoresis Blood pressure high  Pt dizzy   Pt set up for a lexiscan myoview which showed no ischemia   Continued on asa.   Atenolol stopped    Plan for PRN dilt    The pt called in on day of discharge and said he could not tolerate dilitazem  Hungover feeling   Tight in head  Lips cold    Called in 8/13  Wanted to try magnesium   Called in again   BP high   Wanted to go back on atenolol  I saw the pt on Jan 27, 2021    Since then he has called in multiple ties  Has been dizzy   has had palpitations     The pt says that when he he felt OK before taking his meds   Then he took his atenolol this morning and he felt bad after   Weak   hart racing    I saw the pt in Sept  2022   Prior CV studies:   The following studies were reviewed today:  01/23/21  Defect 1: There is a medium defect of mild severity present in the basal inferior, mid inferior and apical inferior location. This is a low risk study. The left ventricular ejection fraction is hyperdynamic (>65%). Nuclear stress EF: 83%. There was no ST segment deviation noted during stress. No T wave inversion was noted during stress.   No evidence of ischemia. There is a medium defect of mild severity in the inferior wall, present at both rest and stress. Normal wall motion in this area. Given significant extracardiac activity in this area, suspect this is artifact. Low risk study. Change compared to prior study, but patient has had cardiac bypass surgery in the interim.    [place] 02/2018 Sinus rhythm   51 to 106 bpm   Average HR 71 bpm Occasional PVC  Occasional PAC Longest pause 1.5 sec Diary entry associated with  SR and also SR with  PAC  Pre CABG Dopplers 12/04/17  Final Interpretation: Right Carotid: The extracranial vessels were near-normal with only minimal wall thickening or plaque. Left Carotid: The extracranial vessels were near-normal with only minimal wall thickening or plaque. Vertebrals:  Bilateral vertebral arteries demonstrate antegrade flow. Subclavians: Normal flow hemodynamics were seen in bilateral subclavian arteries. Right Upper Extremity: Doppler waveforms remain within normal limits with compression. Doppler waveforms remain within normal limits with compression. Left Upper Extremity: Doppler waveforms remain within normal limits with compression. Doppler waveforms remain within normal limits with compression.   Cardiac Catheterization 12/02/17 LAD prox 75, mid 100 (R-L collats); D2 75 LCx prox 80, mid 70 RCA mild disease EF 55-65   Nuclear stress test 11/24/17 Intermediate risk stress nuclear study with a new area of mild ischemia in the distal LAD artery territory.  Normal left ventricular regional and global systolic function. EF 85   Event Monitor 08/2016 Sinus rhythm with PACs  Sensed as skips     Echo 09/01/16 EF 60-65, no RWMA, Gr 2 DD   Past Medical History:  Diagnosis Date   Allergy    Anxiety    Bladder cancer ([place]) 2010   Tx with BCG   CAD (coronary artery disease)    LHC 6/19: pLAD 75, mLAD 100, D2 75; pLCx 80, mLCx 70, EF 55-65 >> s/p CABG // Echo 3/18: EF 60-65, Gr 2 DD   Chronic bronchitis ([place])    "yearly the last 3 yrs" (02/13/2013)   Dysrhythmia    GERD (gastroesophageal reflux disease)    History of blood transfusion 1949   "seeral" (02/13/2013)   HOH (hard of hearing)    Hypertension    Ocular migraine    "not often" (02/13/2013)   Pneumonia    "last time ~ 2 yr ago; had it before that too" (02/13/2013)   Seasonal allergies    Shingles    has neuropathy since it happened in 6/17   Temporal arteritis ([facility])    Thrombocytopenia ([place]) 11/29/2016   Surgical Hx: The patient  has a past surgical history that includes Transurethral resection of bladder tumor (2010 X 3); mastoid tumor removed (Right, 1964); Tonsillectomy (1940's); Skin graft (Right, 1949); Colonoscopy; Vasectomy; Artery Biopsy (Left, 01/09/2013); Cardiovascular stress test (07/2011); Cholecystectomy (N/A, 11/30/2016); LEFT HEART CATH AND CORONARY ANGIOGRAPHY (N/A, 12/02/2017); Coronary artery bypass graft (N/A, 12/07/2017); TEE without cardioversion (N/A, 12/07/2017); Skin graft (Right); and Cystoscopy with biopsy (N/A, 04/21/2020).   Current Medications: No outpatient medications have been marked as taking for the 05/20/21 encounter (Appointment) with Fay Records, MD.     Allergies:   Uloric [febuxostat], Doxazosin, Doxycycline, Omeprazole, Pantoprazole, Augmentin [amoxicillin-pot clavulanate], Ciprofloxacin, Codeine, Levaquin [levofloxacin], Oxycodone, Tramadol, and Vicodin [hydrocodone-acetaminophen]   Social History   Tobacco Use   Smoking status: Former    Packs/day:  0.75    Years: 3.00    Pack years: 2.25    Types: Cigarettes   Smokeless tobacco: Never   Tobacco comments:    02/13/2013 "quit smoking age 45"  Vaping Use   Vaping Use: Never used  Substance Use Topics   Alcohol use: No    Comment: 02/13/2013 "probably a pint of whiskey/wk up til I was probably 86 yr old"   Drug use: No     Family Hx: The patient's family history includes Alzheimer's disease in his father; Cancer in his mother, sister, and sister. There is no history of Anemia, Arrhythmia, Asthma, Clotting disorder, Fainting, Heart attack, Heart  disease, Heart failure, Hyperlipidemia, Hypertension, or Migraines.  ROS:   Please see the history of present illness.    All other systems reviewed and are negative.   EKGs/Labs/Other Test Reviewed:    EKG:   Sinus bradycardia 55 bpm  First degree AV block RBBB  Recent Labs: 02/04/2021: Magnesium 2.0; TSH 3.130 04/23/2021: ALT 14; BUN 20; Creatinine, Ser 1.52; Hemoglobin 15.1; Platelets 131; Potassium 4.5; Sodium 141   Recent Lipid Panel Lab Results  Component Value Date/Time   CHOL 116 02/04/2021 02:39 PM   TRIG 184 (H) 02/04/2021 02:39 PM   HDL 35 (L) 02/04/2021 02:39 PM   CHOLHDL 3.3 02/04/2021 02:39 PM   CHOLHDL 3.3 12/02/2017 12:53 AM   LDLCALC 51 02/04/2021 02:39 PM    Physical Exam:    VS:  There were no vitals taken for this visit.    Orthostatics:   BP 142/70  P 54   Sitting    118/60   P 71    Standing  116/60  P 59   Standing 4 mint   130/78   P 59   Wt Readings from Last 3 Encounters:  05/15/21 197 lb 6.4 oz (89.5 kg)  05/05/21 199 lb (90.3 kg)  04/23/21 197 lb 6.4 oz (89.5 kg)   Gen:   Pt is in NAD  Neck:   JVP is normal    lungs:   Clear to ausculation  No rales   Cardiac RRR S1, S2  No S3    No signific murmurs  Abd   Supple   No hepatomegaly Nontender Ext  Triv LE   edema     2+ pulses    Neuro exam:  Deferred     EKG not done today  ASSESSMENT & PLAN:     Dizziness/ palpitations   Pt has called in  many times with dizzines and palpitaitions    If he takes meds he says he's dizzy   If doesn't then BP is high   Tried diltiazem   Didn't tolerate   On low dose atenolol Today SBP 110s  HR is 50s    Orthostatics he drops transiently then there is an increase in HR.  As goes on BP and HR back toward baseline   Recomm  I would hold atenolol for now   If BP is above 170 can take 1/2 of amlodipine    Follow  COnsider very low dose klonopin to even out HR  1   HTN  Difficult   Has been intolerant to many med trials    Labile as noted with change in position   Corrects    With High BP he would not tolerate anythng that would boost higher Discussed abdominal binder  Also to keep legs active   2  CAD   Pt s/p CABG in 2019    Myoview this recent admit showed no ischemia  Chest prressure prob due to BP elevations   3 HL   Intolerant to statins  Repatha initiated  Need to confirm if taking       Medication Adjustments/Labs and Tests Ordered: Current medicines are reviewed at length with the patient today.  Concerns regarding medicines are outlined above.  Tests Ordered: No orders of the defined types were placed in this encounter.   Medication Changes: No orders of the defined types were placed in this encounter.   Signed, Dorris Carnes, MD  05/19/2021 10:54 PM    [facility] [phone]  [address] [place], [place]  [address] Phone: [phone]; Fax: [phone]

## 2021-05-20 ENCOUNTER — Ambulatory Visit: Payer: PPO | Admitting: Internal Medicine

## 2021-06-03 ENCOUNTER — Ambulatory Visit: Payer: PPO | Admitting: Neurology

## 2021-06-03 NOTE — Progress Notes (Deleted)
PATIENT: Ronald SHEW Sr. DOB: Jul 26, 1934  REASON FOR VISIT: follow up HISTORY FROM: patient  No chief complaint on file.    HISTORY OF PRESENT ILLNESS: Today 06/03/21 Ronald Fear Sr. is a 86 y.o. male here today for follow up for dizziness. He was evaluated by Dr Jaynee Eagles in 12/2018. MRI was unchanged from 2017 and MRA normal. He was found to be orthostatic and had reported taking an extra dose of atenolol due to PVC's. Labwork was unremarkable with the exception of mildly abormal TSH followed closely by PCP. Most recent result normal. CRP and ESR normal.   He was evaluated by cardiology who reports normal finding. Cardiology sent him to ENT. 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 He has been treated for recurrent otitis media and diffuse otitis externa. He also has difficulty with impacted cerumen. When he has cerumen extraction, dizziness significantly worsens for about 5 minutes. He has been treated with multiple rounds on antibiotics orally, prednisone and abx drops. Last 08/07/2019. He has taken 69m daily for about the past month, last dose yesterday. He has had previous concerns of increased palpitations with steroid use. He does report mild palpitations with last round of prednisone. He reports that he feels that he has a rubber band around his head all the time. He gets dizzy when he stands up. When he is taking antibiotics and steroids, symptoms almost completely resolve. He has a fractured tooth. His dentist has reccommended having it extracted but he is hesitant to do so. He denies swelling or pain.   He gets 45-50 ounces of water daily. He does note tinnitus of right ear today. He is having a hearing test next week.     HISTORY: (copied from Dr ACathren Lainenote on 01/09/2019)  HPI:  Ronald STEYERSr. is a 87y.o. male here as requested by SClaretta Fraise MD for dizziness and vertigo, past medical history  of CABG x3, coronary artery disease, thrombo-cytopenia, palpitations, hyperlipidemia, renal insufficiency, gout, postherpetic neuralgia, anxiety, benign paroxysmal positional vertigo, temporal arteritis, hypertension, symptomatic PVCs. Here with his lovely wife who also provides information. He says his headache is in temples and mostly on the left with a knot feeling, he had a biopsy which was negative. He has had temple for 30 years and he has ocular migraines as well. A biopsy by Dr. NLucia Gaskinswas negative but Dr. NLucia Gaskinswas very confident he had Temporal Arteritis. It is tender to touch. He has been on antibiotics and his dizziness feels better. He was hit with a branch in April he had 2 stitches, his "swimmy head" is better but that was the initial inciting event. He has PVCs. No jaw pain, no chewing difficulty, no vision changes. The headaches are not worse, he has them and they can last 5-30 minutes, pulsating and pounding, he can swishing in his ears and heartbeat in the temples. "swimmy head" mostly when getting up and standing. Also when turning over in bed. His biggest concern is his "swimmy" head, he gets sick and his legs get weak, starts feeling light pass out, no spinning. No other focal neurologic deficits, associated symptoms, inciting events or modifiable factors.   Reviewed notes, labs and imaging from outside physicians, which showed:   I reviewed Dr. SLivia Snellennotes patient presented with "swimmy headed" for 2 days.  Associated with nausea.  Started when he went to ENT for ear problems 4 to  5 months earlier.  CT review from April 20 confirmed ethmoid disease.   I reviewed notes from December 30, 2018 patient was seen in the emergency room 6 weeks of worsening palpitations, past 3 weeks palpitations worse and he is taking atenolol 12.5 mg daily and when he was seen he had been taking additional for the palpitations, he follows with cardiology regularly, cardiology seeing him for EKG, no shortness of  breath or chest pain or leg swelling orthopnea or paroxysmal dyspnea, he has been on antibiotics and steroids for the previous month due to sinus infection, the physician started him on meclizine.   REVIEW OF SYSTEMS: Out of a complete 14 system review of symptoms, the patient complains only of the following symptoms, dizziness, ear pain, and all other reviewed systems are negative.  ALLERGIES: Allergies  Allergen Reactions   Uloric [Febuxostat] Palpitations, Other (See Comments) and Hypertension    Hypertension with palpitations and a sense of fuzziness and dizziness at the left side of his head   Doxazosin Diarrhea   Doxycycline Diarrhea   Omeprazole Other (See Comments)   Pantoprazole Other (See Comments)    Can tolerate $RemoveBefo'20mg'ghKrTrnsXSM$    Augmentin [Amoxicillin-Pot Clavulanate] Diarrhea   Ciprofloxacin Diarrhea   Codeine Nausea And Vomiting and Other (See Comments)    Severe headache   Levaquin [Levofloxacin] Diarrhea   Oxycodone Nausea And Vomiting   Tramadol Nausea And Vomiting   Vicodin [Hydrocodone-Acetaminophen] Nausea And Vomiting    HOME MEDICATIONS: Outpatient Medications Prior to Visit  Medication Sig Dispense Refill   acetaminophen (TYLENOL) 650 MG CR tablet Take 650 mg by mouth every 6 (six) hours as needed for pain.     acidophilus (RISAQUAD) CAPS capsule Take 1 capsule by mouth every evening.     aspirin EC 81 MG tablet Take 1 tablet (81 mg total) by mouth daily. 90 tablet 3   atenolol (TENORMIN) 25 MG tablet Take 1/2 tablet by mouth daily, may take additional 1/4-1/2 tablet daily as needed for palpitations 90 tablet 3   Cholecalciferol 50 MCG (2000 UT) TABS 50 mcg.     Cholecalciferol 50 MCG (2000 UT) TABS Take by mouth.     Cyanocobalamin (VITAMIN B-12 PO) Take 1 tablet by mouth daily.     Evolocumab (REPATHA SURECLICK) 616 MG/ML SOAJ Inject 1 pen into the skin every 14 (fourteen) days. 6 mL 3   fluocinonide cream (LIDEX) 0.73 % Apply 1 application topically 2 (two) times  daily. 30 g 0   guaiFENesin (MUCINEX) 600 MG 12 hr tablet Take by mouth.     hydrocortisone (ANUSOL-HC) 25 MG suppository Place 1 suppository (25 mg total) rectally 2 (two) times daily. 12 suppository 0   lansoprazole (PREVACID) 15 MG capsule TAKE 1 CAPSULE (15 MG TOTAL) BY MOUTH DAILY. STOP OMEPRAZOLE (FOR ACID REFLUX) 90 capsule 0   Potassium Gluconate 550 MG TABS Take 1 tablet (550 mg total) by mouth at bedtime. Need to take 1 tab one day and 2 the next day alternating (Patient taking differently: Take 550 mg by mouth at bedtime.) 30 tablet 0   psyllium (METAMUCIL) 58.6 % powder Take 1 packet by mouth daily.     sildenafil (REVATIO) 20 MG tablet Take 20 mg by mouth as needed.     Vitamin D, Cholecalciferol, 10 MCG (400 UNIT) CAPS Take 400 Units by mouth daily. 100 capsule 12   No facility-administered medications prior to visit.    PAST MEDICAL HISTORY: Past Medical History:  Diagnosis Date   Allergy  Anxiety    Bladder cancer ([place]) 2010   Tx with BCG   CAD (coronary artery disease)    LHC 6/19: pLAD 75, mLAD 100, D2 75; pLCx 80, mLCx 70, EF 55-65 >> s/p CABG // Echo 3/18: EF 60-65, Gr 2 DD   Chronic bronchitis ([place])    "yearly the last 3 yrs" (02/13/2013)   Dysrhythmia    GERD (gastroesophageal reflux disease)    History of blood transfusion 1949   "seeral" (02/13/2013)   HOH (hard of hearing)    Hypertension    Ocular migraine    "not often" (02/13/2013)   Pneumonia    "last time ~ 2 yr ago; had it before that too" (02/13/2013)   Seasonal allergies    Shingles    has neuropathy since it happened in 6/17   Temporal arteritis ([place])    Thrombocytopenia ([place]) 11/29/2016    PAST SURGICAL HISTORY: Past Surgical History:  Procedure Laterality Date   ARTERY BIOPSY Left 01/09/2013   Procedure: BIOPSY TEMPORAL ARTERY;  Surgeon: Rozetta Nunnery, MD;  Location: [facility];  Service: ENT;  Laterality: Left;   CARDIOVASCULAR STRESS TEST  07/2011   No evidence of  ischemia; EF 79%   CHOLECYSTECTOMY N/A 11/30/2016   Procedure: LAPAROSCOPIC CHOLECYSTECTOMY;  Surgeon: Aviva Signs, MD;  Location: [facility];  Service: General;  Laterality: N/A;   COLONOSCOPY     CORONARY ARTERY BYPASS GRAFT N/A 12/07/2017   Procedure: CORONARY ARTERY BYPASS GRAFTING (CABG) times three using left internal mammary artery and left endoscopically harvested saphenous vein graft;  Surgeon: Melrose Nakayama, MD;  Location: [facility];  Service: Open Heart Surgery;  Laterality: N/A;   CYSTOSCOPY WITH BIOPSY N/A 04/21/2020   Procedure: CYSTOSCOPY WITH BLADDER BIOPSY/ FULGURATION;  Surgeon: Raynelle Bring, MD;  Location: [facility];  Service: Urology;  Laterality: N/A;  ONLY NEEDS 45 MIN   LEFT HEART CATH AND CORONARY ANGIOGRAPHY N/A 12/02/2017   Procedure: LEFT HEART CATH AND CORONARY ANGIOGRAPHY;  Surgeon: Jettie Booze, MD;  Location: [facility];  Service: Cardiovascular;  Laterality: N/A;   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[place]    lower lower leg burn; "probably 4-5 ORs in 1949 for this" (02/13/2013)   SKIN GRAFT Right    upper and lower leg   TEE WITHOUT CARDIOVERSION N/A 12/07/2017   Procedure: TRANSESOPHAGEAL ECHOCARDIOGRAM (TEE);  Surgeon: Melrose Nakayama, MD;  Location: [facility];  Service: Open Heart Surgery;  Laterality: N/A;   TONSILLECTOMY  1940's   TRANSURETHRAL RESECTION OF BLADDER TUMOR  2010 X 3   "cancer" (02/13/2013)   VASECTOMY      FAMILY HISTORY: Family History  Problem Relation Age of Onset   Cancer Mother        breast   Alzheimer's disease Father    Cancer Sister        Breast cancer   Cancer Sister        colon cancer   Anemia Neg Hx    Arrhythmia Neg Hx    Asthma Neg Hx    Clotting disorder Neg Hx    Fainting Neg Hx    Heart attack Neg Hx    Heart disease Neg Hx    Heart failure Neg Hx    Hyperlipidemia Neg Hx    Hypertension Neg Hx    Migraines Neg Hx     SOCIAL HISTORY: Social History   Socioeconomic History    Marital status: Married  Spouse name: [name]   Number of children: 4   Years of education: GED   Highest education level: GED or equivalent  Occupational History   Occupation: Retired    Comment: Security  Tobacco Use   Smoking status: Former    Packs/day: 0.75    Years: 3.00    Pack years: 2.25    Types: Cigarettes   Smokeless tobacco: Never   Tobacco comments:    02/13/2013 "quit smoking age 63"  Vaping Use   Vaping Use: Never used  Substance and Sexual Activity   Alcohol use: No    Comment: 02/13/2013 "probably a pint of whiskey/wk up til I was probably 86 yr old"   Drug use: No   Sexual activity: Yes    Birth control/protection: None  Other Topics Concern   Not on file  Social History Narrative   Lives with wife   Caffeine use: 15-16oz soda per day, no soda   Usually drinks 60 oz water daily   Social Determinants of Health   Financial Resource Strain: Low Risk    Difficulty of Paying Living Expenses: Not hard at all  Food Insecurity: No Food Insecurity   Worried About Charity fundraiser in the Last Year: Never true   [place] in the Last Year: Never true  Transportation Needs: No Transportation Needs   Lack of Transportation (Medical): No   Lack of Transportation (Non-Medical): No  Physical Activity: Sufficiently Active   Days of Exercise per Week: 7 days   Minutes of Exercise per Session: 30 min  Stress: No Stress Concern Present   Feeling of Stress : Not at all  Social Connections: Socially Integrated   Frequency of Communication with Friends and Family: More than three times a week   Frequency of Social Gatherings with Friends and Family: More than three times a week   Attends Religious Services: More than 4 times per year   Active Member of [employer] or Organizations: Yes   Attends Music therapist: More than 4 times per year   Marital Status: Married  Human resources officer Violence: Not At Risk   Fear of Current or Ex-Partner: No    Emotionally Abused: No   Physically Abused: No   Sexually Abused: No      PHYSICAL EXAM  There were no vitals filed for this visit.  There is no height or weight on file to calculate BMI.  Lying 129/72 53p Sitting 133/65 53p Standing 116/56 56p  Generalized: Well developed, in no acute distress  Cardiology: normal rate and rhythm, no murmur noted Neurological examination  Mentation: Alert oriented to time, place, history taking. Follows all commands speech and language fluent Cranial nerve II-XII: Pupils were equal round reactive to light. Extraocular movements were full, visual field were full on confrontational test. Facial sensation and strength were normal. Uvula tongue midline. Head turning and shoulder shrug  were normal and symmetric. Motor: The motor testing reveals 5 over 5 strength of all 4 extremities. Good symmetric motor tone is noted throughout.  Sensory: Sensory testing is intact to soft touch on all 4 extremities. No evidence of extinction is noted.  Coordination: Cerebellar testing reveals good finger-nose-finger and heel-to-shin bilaterally.  Gait and station: Gait is normal.   ENT: right canal with significant stenosis, cannot visualize TM, left canal and TM intact.   DIAGNOSTIC DATA (LABS, IMAGING, TESTING) - I reviewed patient records, labs, notes, testing and imaging myself where available.  No flowsheet data found.  Lab Results  Component Value Date   WBC 5.7 04/23/2021   HGB 15.1 04/23/2021   HCT 45.3 04/23/2021   MCV 91 04/23/2021   PLT 131 (L) 04/23/2021      Component Value Date/Time   NA 141 04/23/2021 1233   K 4.5 04/23/2021 1233   CL 105 04/23/2021 1233   CO2 24 04/23/2021 1233   GLUCOSE 93 04/23/2021 1233   GLUCOSE 109 (H) 01/21/2021 2322   BUN 20 04/23/2021 1233   CREATININE 1.52 (H) 04/23/2021 1233   CALCIUM 9.0 04/23/2021 1233   PROT 6.2 04/23/2021 1233   ALBUMIN 4.3 04/23/2021 1233   AST 18 04/23/2021 1233   ALT 14 04/23/2021  1233   ALKPHOS 53 04/23/2021 1233   BILITOT 0.7 04/23/2021 1233   GFRNONAA >60 01/21/2021 2322   GFRAA 51 (L) 12/21/2019 1151   Lab Results  Component Value Date   CHOL 116 02/04/2021   HDL 35 (L) 02/04/2021   LDLCALC 51 02/04/2021   TRIG 184 (H) 02/04/2021   CHOLHDL 3.3 02/04/2021   Lab Results  Component Value Date   HGBA1C 5.3 02/04/2021   Lab Results  Component Value Date   VITAMINB12 330 02/07/2017   Lab Results  Component Value Date   TSH 3.130 02/04/2021       ASSESSMENT AND PLAN 86 y.o. year old male  has a past medical history of Allergy, Anxiety, Bladder cancer ([place]) (2010), CAD (coronary artery disease), Chronic bronchitis ([place]), Dysrhythmia, GERD (gastroesophageal reflux disease), History of blood transfusion (1949), HOH (hard of hearing), Hypertension, Ocular migraine, Pneumonia, Seasonal allergies, Shingles, Temporal arteritis ([place]), and Thrombocytopenia ([place]) (11/29/2016). here with   No diagnosis found.   Mr Hogeland continues to have concerns of pretty consistent dizziness and feeling "swimmy headed". He does also feel a tightness or rubber band sensation around his head when he is dizzy. It is worse with position changes and significantly improves with antibiotics and steroids. I have reviewed previous notes, labs and imaging. We have been unable to identify any particular neurological explanation of symptoms. Orthostatic blood pressures performed and 20 point drop notes of systolic pressure from sitting to standing but patient was not symptomatic with drop. Cardiology workup has been negative. ENT does not feel that right canal stenosis post mastoidectomy or recurrent ear infections are related to symptoms. He feels much better on prednisone but does not PVC's with continued use or high doses. I have discussed our findings from a neurologic stand point. I will refer him to vestibular therapy to see if this helps at all, although, it does not seem to be vertiginous.  He will continue close follow up with PCP, cardiology and ENT as directed. He will stay well hydrated and monitor BP at home. Fall precautions advised. He will follow up with [place] in 6 months, sooner if needed. He and his wife verbalize understanding.    No orders of the defined types were placed in this encounter.    No orders of the defined types were placed in this encounter.     I spent 45 minutes with the patient. 50% of this time was spent counseling and educating patient on plan of care and medications.    [name], FNP-C 06/03/2021, 9:46 AM [facility] [address], [place], [place] [address] [phone]  Made any corrections needed, and agree with history, physical, neuro exam,assessment and plan as stated.     Sarina Ill, MD [facility]

## 2021-06-06 ENCOUNTER — Other Ambulatory Visit: Payer: Self-pay | Admitting: Family Medicine

## 2021-06-06 DIAGNOSIS — K219 Gastro-esophageal reflux disease without esophagitis: Secondary | ICD-10-CM

## 2021-06-24 DIAGNOSIS — N183 Chronic kidney disease, stage 3 unspecified: Secondary | ICD-10-CM | POA: Diagnosis not present

## 2021-07-07 ENCOUNTER — Encounter: Payer: Self-pay | Admitting: Family Medicine

## 2021-07-07 ENCOUNTER — Ambulatory Visit (INDEPENDENT_AMBULATORY_CARE_PROVIDER_SITE_OTHER): Payer: PPO | Admitting: Family Medicine

## 2021-07-07 VITALS — BP 121/81 | HR 58 | Temp 98.5°F | Ht 72.0 in | Wt 198.0 lb

## 2021-07-07 DIAGNOSIS — K219 Gastro-esophageal reflux disease without esophagitis: Secondary | ICD-10-CM

## 2021-07-07 MED ORDER — ESOMEPRAZOLE MAGNESIUM 20 MG PO TBEC: 20.0000 mg | DELAYED_RELEASE_TABLET | Freq: Every day | ORAL | 3 refills | Status: DC
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
## 2021-07-09 ENCOUNTER — Ambulatory Visit: Payer: PPO | Admitting: Neurology

## 2021-07-14 ENCOUNTER — Ambulatory Visit: Payer: PPO | Admitting: Neurology

## 2021-07-31 ENCOUNTER — Other Ambulatory Visit: Payer: Self-pay

## 2021-07-31 ENCOUNTER — Encounter: Payer: Self-pay | Admitting: Internal Medicine

## 2021-07-31 ENCOUNTER — Ambulatory Visit (INDEPENDENT_AMBULATORY_CARE_PROVIDER_SITE_OTHER): Payer: PPO | Admitting: Internal Medicine

## 2021-07-31 ENCOUNTER — Other Ambulatory Visit (HOSPITAL_COMMUNITY)
Admission: RE | Admit: 2021-07-31 | Discharge: 2021-07-31 | Disposition: A | Discharge status: Home / Self Care | Payer: PPO | Source: Ambulatory Visit | Attending: Internal Medicine | Admitting: Internal Medicine

## 2021-07-31 ENCOUNTER — Telehealth: Payer: Self-pay

## 2021-07-31 VITALS — BP 124/68 | HR 62 | Ht 72.0 in | Wt 197.0 lb

## 2021-07-31 DIAGNOSIS — R42 Dizziness and giddiness: Secondary | ICD-10-CM

## 2021-07-31 LAB — VITAMIN D 25 HYDROXY (VIT D DEFICIENCY, FRACTURES): Vit D, 25-Hydroxy: 49.07 ng/mL (ref 30–100)

## 2021-07-31 LAB — BASIC METABOLIC PANEL
Anion gap: 10 (ref 5–15)
BUN: 18 mg/dL (ref 8–23)
CO2: 20 mmol/L — ABNORMAL LOW (ref 22–32)
Calcium: 9 mg/dL (ref 8.9–10.3)
Chloride: 110 mmol/L (ref 98–111)
Creatinine, Ser: 1.4 mg/dL — ABNORMAL HIGH (ref 0.61–1.24)
GFR, Estimated: 49 mL/min — ABNORMAL LOW (ref >60)
Glucose, Bld: 107 mg/dL — ABNORMAL HIGH (ref 70–99)
Potassium: 4 mmol/L (ref 3.5–5.1)
Sodium: 140 mmol/L (ref 135–145)

## 2021-07-31 NOTE — Patient Instructions (Signed)
Labwork: BMET VITAMIN D  Follow-Up: Follow up in 6 months with Dr. Harrington Challenger  Any Other Special Instructions Will Be Listed Below (If Applicable).     If you need a refill on your cardiac medications before your next appointment, please call your pharmacy.

## 2021-07-31 NOTE — Telephone Encounter (Signed)
-----   Message from Fay Records, MD sent at 07/31/2021  3:35 PM EST ----- Electrolytes are OK    Kidney function is relatively stable

## 2021-07-31 NOTE — Progress Notes (Signed)
Cardiology Office Note:    Date:  07/31/2021   ID:  Ronald Fear Sr., DOB 1935/05/31, MRN [id number]  PCP:  Baruch Gouty, FNP  Cardiologist:  Dorris Carnes, MD   Electrophysiologist:  None   Referring MD: Claretta Fraise, MD   F/U of CAD and dizziness  History of Present Illness:    Ronald Russell. is a 87 y.o. male with coronary artery disease (s/p CABG in June 2019; postop with atrial fibrillation)   Holter monitor in Sept 2019 shows NSR, occasional PACs/PVCs  Again in Aug 2020 SR with PACs  No afib   Echo in July 2020 LVEF normal   He has had palpitations    Adjustment of meds limited by bradycardia   The pt also has a hx of HTN, HL bladder cancer, temporal arteritis, hypertension, hyperlipidemia  (patient intolerant to statins) The pt has had a long hx of dizziness   Seen by ENT   No cause found   Has been to [facility]    Pt seen by Sundra Aland in July 2022 for med recomm for cholesterol  Main complaints were dizziness  (extensive work up in past)  Agreed to trying [id number] The pt called in with headache  BP OK  Worried it was due to [place] On 8/10 called  Atenolol held due to dizziness   BP high 175/105   Complained of chest pressure  Took atenolol and swimmy headed, unsteady   Went to ED 01/21/21 with CP and SOB and diaphoresis Blood pressure high  Pt dizzy   Pt set up for a lexiscan myoview which showed no ischemia   Continued on asa.   Atenolol stopped    Plan for PRN dilt    The pt called in on day of discharge and said he could not tolerate dilitazem  Hungover feeling   Tight in head  Lips cold    Called in 8/13  Wanted to try magnesium   Called in again   BP high   Wanted to go back on   I saw the pt in September  Since seen he says he has been doing pretty good   He still gets dizzy with quick standing but is careul.   He als says that he has to take atenolol 2 xp er day or he has palpitations   He says ater the morning dose he will feel weak though      Prior CV studies:    The following studies were reviewed today:  01/23/21  Defect 1: There is a medium defect of mild severity present in the basal inferior, mid inferior and apical inferior location. This is a low risk study. The left ventricular ejection fraction is hyperdynamic (>65%). Nuclear stress EF: 83%. There was no ST segment deviation noted during stress. No T wave inversion was noted during stress.   No evidence of ischemia. There is a medium defect of mild severity in the inferior wall, present at both rest and stress. Normal wall motion in this area. Given significant extracardiac activity in this area, suspect this is artifact. Low risk study. Change compared to prior study, but patient has had cardiac bypass surgery in the interim.    66 Hr Holter 02/2018 Sinus rhythm   51 to 106 bpm   Average HR 71 bpm Occasional PVC  Occasional PAC Longest pause 1.5 sec Diary entry associated with SR and also SR with  PAC  Pre CABG Dopplers 12/04/17  Final  Interpretation: Right Carotid: The extracranial vessels were near-normal with only minimal wall thickening or plaque. Left Carotid: The extracranial vessels were near-normal with only minimal wall thickening or plaque. Vertebrals:  Bilateral vertebral arteries demonstrate antegrade flow. Subclavians: Normal flow hemodynamics were seen in bilateral subclavian arteries. Right Upper Extremity: Doppler waveforms remain within normal limits with compression. Doppler waveforms remain within normal limits with compression. Left Upper Extremity: Doppler waveforms remain within normal limits with compression. Doppler waveforms remain within normal limits with compression.   Cardiac Catheterization 12/02/17 LAD prox 75, mid 100 (R-L collats); D2 75 LCx prox 80, mid 70 RCA mild disease EF 55-65   Nuclear stress test 11/24/17 Intermediate risk stress nuclear study with a new area of mild ischemia in the distal LAD artery territory. Normal left ventricular  regional and global systolic function. EF 85   Event Monitor 08/2016 Sinus rhythm with PACs  Sensed as skips     Echo 09/01/16 EF 60-65, no RWMA, Gr 2 DD   Past Medical History:  Diagnosis Date   Allergy    Anxiety    Bladder cancer ([place]) 2010   Tx with BCG   CAD (coronary artery disease)    LHC 6/19: pLAD 75, mLAD 100, D2 75; pLCx 80, mLCx 70, EF 55-65 >> s/p CABG // Echo 3/18: EF 60-65, Gr 2 DD   Chronic bronchitis ([place])    "yearly the last 3 yrs" (02/13/2013)   Dysrhythmia    GERD (gastroesophageal reflux disease)    History of blood transfusion 1949   "seeral" (02/13/2013)   HOH (hard of hearing)    Hypertension    Ocular migraine    "not often" (02/13/2013)   Pneumonia    "last time ~ 2 yr ago; had it before that too" (02/13/2013)   Seasonal allergies    Shingles    has neuropathy since it happened in 6/17   Temporal arteritis ([facility])    Thrombocytopenia ([place]) 11/29/2016   Surgical Hx: The patient  has a past surgical history that includes Transurethral resection of bladder tumor (2010 X 3); mastoid tumor removed (Right, 1964); Tonsillectomy (1940's); Skin graft (Right, 1949); Colonoscopy; Vasectomy; Artery Biopsy (Left, 01/09/2013); Cardiovascular stress test (07/2011); Cholecystectomy (N/A, 11/30/2016); LEFT HEART CATH AND CORONARY ANGIOGRAPHY (N/A, 12/02/2017); Coronary artery bypass graft (N/A, 12/07/2017); TEE without cardioversion (N/A, 12/07/2017); Skin graft (Right); and Cystoscopy with biopsy (N/A, 04/21/2020).   Current Medications: Current Meds  Medication Sig   acetaminophen (TYLENOL) 650 MG CR tablet Take 650 mg by mouth every 6 (six) hours as needed for pain.   acidophilus (RISAQUAD) CAPS capsule Take 1 capsule by mouth every evening.   aspirin EC 81 MG tablet Take 1 tablet (81 mg total) by mouth daily.   atenolol (TENORMIN) 25 MG tablet Take 1/2 tablet by mouth daily, may take additional 1/4-1/2 tablet daily as needed for palpitations   Cholecalciferol 50 MCG (2000 UT)  TABS 50 mcg.   Cholecalciferol 50 MCG (2000 UT) TABS Take by mouth.   Cyanocobalamin (VITAMIN B-12 PO) Take 1 tablet by mouth daily.   Esomeprazole Magnesium (NEXIUM 24HR) 20 MG TBEC Take 20 mg by mouth daily.   Evolocumab (REPATHA SURECLICK) 638 MG/ML SOAJ Inject 1 pen into the skin every 14 (fourteen) days.   fluocinonide cream (LIDEX) 4.66 % Apply 1 application topically 2 (two) times daily.   guaiFENesin (MUCINEX) 600 MG 12 hr tablet Take by mouth.   hydrocortisone (ANUSOL-HC) 25 MG suppository Place 1 suppository (25 mg total) rectally 2 (  two) times daily.   Potassium Gluconate 550 MG TABS Take 1 tablet (550 mg total) by mouth at bedtime. Need to take 1 tab one day and 2 the next day alternating (Patient taking differently: Take 550 mg by mouth at bedtime.)   psyllium (METAMUCIL) 58.6 % powder Take 1 packet by mouth daily.   sildenafil (REVATIO) 20 MG tablet Take 20 mg by mouth as needed.   Vitamin D, Cholecalciferol, 10 MCG (400 UNIT) CAPS Take 400 Units by mouth daily.     Allergies:   Uloric [febuxostat], Doxazosin, Doxycycline, Omeprazole, Pantoprazole, Augmentin [amoxicillin-pot clavulanate], Ciprofloxacin, Codeine, Levaquin [levofloxacin], Oxycodone, Tramadol, and Vicodin [hydrocodone-acetaminophen]   Social History   Tobacco Use   Smoking status: Former    Packs/day: 0.75    Years: 3.00    Pack years: 2.25    Types: Cigarettes   Smokeless tobacco: Never   Tobacco comments:    02/13/2013 "quit smoking age 85"  Vaping Use   Vaping Use: Never used  Substance Use Topics   Alcohol use: No    Comment: 02/13/2013 "probably a pint of whiskey/wk up til I was probably 87 yr old"   Drug use: No     Family Hx: The patient's family history includes Alzheimer's disease in his father; Cancer in his mother, sister, and sister. There is no history of Anemia, Arrhythmia, Asthma, Clotting disorder, Fainting, Heart attack, Heart disease, Heart failure, Hyperlipidemia, Hypertension, or  Migraines.  ROS:   Please see the history of present illness.    All other systems reviewed and are negative.   EKGs/Labs/Other Test Reviewed:    EKG:   Sinus bradycardia 55 bpm  First degree AV block RBBB  Recent Labs: 02/04/2021: Magnesium 2.0; TSH 3.130 04/23/2021: ALT 14; BUN 20; Creatinine, Ser 1.52; Hemoglobin 15.1; Platelets 131; Potassium 4.5; Sodium 141   Recent Lipid Panel Lab Results  Component Value Date/Time   CHOL 116 02/04/2021 02:39 PM   TRIG 184 (H) 02/04/2021 02:39 PM   HDL 35 (L) 02/04/2021 02:39 PM   CHOLHDL 3.3 02/04/2021 02:39 PM   CHOLHDL 3.3 12/02/2017 12:53 AM   LDLCALC 51 02/04/2021 02:39 PM    Physical Exam:    VS:  BP 124/68    Pulse 62    Ht 6' (1.829 m)    Wt 197 lb (89.4 kg)    SpO2 98%    BMI 26.72 kg/m     Orthostatics:   BP 142/70  P 54   Sitting    118/60   P 71    Standing  116/60  P 59   Standing 4 mint   130/78   P 59   Wt Readings from Last 3 Encounters:  07/31/21 197 lb (89.4 kg)  07/07/21 198 lb (89.8 kg)  05/15/21 197 lb 6.4 oz (89.5 kg)   Gen:   Pt is in NAD  Neck:   JVP is not elevated  lungs:   Clear to ausculation  No rales   Cardiac RRR S1, S2  No S3    No signific murmurs  Abd   Supple   No hepatomegaly Nontender Ext  Triv LE   edema     2+ pulses    Neuro exam:  Deferred     EKG not done today  ASSESSMENT & PLAN:     1  Dizziness / palpitation.  Pt overall doing OK   Still says he feels week about 1 hour after am atenolol   I does not  take or if consolidates to 1x per day he has palpitations     I would keep on same regimen   2   HTN  BP has been OK    2  CAD   Pt s/p CABG in 2019  Last  Myoviewshowed no ischemia   Patient asympomtatic   3 HL   Intolerant to statins Taking Repatha    Medication Adjustments/Labs and Tests Ordered: Current medicines are reviewed at length with the patient today.  Concerns regarding medicines are outlined above.  Tests Ordered: No orders of the defined types were placed in  this encounter.   Medication Changes: No orders of the defined types were placed in this encounter.    Signed, Dorris Carnes, MD  07/31/2021 1:10 PM    [facility] [place], [place], [place]  [id number] Phone: [phone]; Fax: [phone]

## 2021-07-31 NOTE — Telephone Encounter (Signed)
Patient notified and verbalized understanding. Pt had no questions or concerns at this time 

## 2021-08-10 ENCOUNTER — Ambulatory Visit: Payer: PPO | Admitting: Neurology

## 2021-08-10 VITALS — BP 121/67 | HR 63 | Wt 197.4 lb

## 2021-08-10 DIAGNOSIS — I951 Orthostatic hypotension: Secondary | ICD-10-CM

## 2021-08-10 DIAGNOSIS — R42 Dizziness and giddiness: Secondary | ICD-10-CM | POA: Insufficient documentation

## 2021-08-10 NOTE — Progress Notes (Signed)
[facility]    Provider:  Dr Jaynee Eagles Requesting Provider: Baruch Gouty, FNP Primary Care Provider:  Baruch Gouty, FNP  CC:  Swimmy Head  Interval history 08/10/2021:  if he shakes his head he gets a little dizzy "swimmy head", I explained we have evaluated him extensively and I do not think this is of primary neurologic pathology. His left ear "goes crazy", when he turns to quickly to one side it makes him dizzy. He had surgery on his ear and goes to see ENT on the 14th, Same symptoms for years, he was feeling better before the surgery on his ear and he has been worse since then. I think this is due to inner ear issues, not neurologic primarily. Atenolol makes him dizzy likely causes some low blood pressure or low pulse, he is not wearing compression stockings, he has PVCs and difficult to manage due to bradycardia and he is working on it with cardiology.   Patient complains of symptoms per HPI as well as the following symptoms: dizzy . Pertinent negatives and positives per HPI. All others negative   Interval history 08/10/2021: Patient is here for follow-up of dizziness.  I reviewed cardiology notes from August 06, 2021, patient states that he is feeling pretty good, only some dizziness when standing quickly, he has been working with cardiology for management of his cardiac history including bradycardia, coronary artery disease (s/p CABG in June 2019; postop with atrial fibrillation)   Holter monitor in Sept 2019 shows NSR, occasional PACs/PVCs  Again in Aug 2020 SR with PACs  No afib   Echo in July 2020 LVEF normal, He has had palpitations    Adjustment of meds limited by bradycardia, per ntes reviewed [facility]. The pt also has a hx of HTN, HL bladder cancer, temporal arteritis, hypertension, hyperlipidemia I evaluated him in 2020, he was seen in follow-up by my nurse practitioner.  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  MRI brain 2020: IMPRESSION: This MRI of the brain with and without contrast shows the following: 1.   T2/flair hyperintense foci in the hemispheres most consistent with chronic microvascular ischemic change, stable compared to the 2017 MRI. 2.   Stable mastoid changes on the right that could represent a small amount of fluid or scarring.  Unchanged compared to the 2017 MRI.   3.   There are no acute findings and the enhancement pattern is normal. MRA had 2020: normal  Amy Lomax: Today 09/20/19 Ronald Fear Sr. is a 87 y.o. male here today for follow up for dizziness. He was evaluated by Dr Jaynee Eagles in 12/2018. MRI was unchanged from 2017 and MRA normal. He was found to be orthostatic and had reported taking an extra dose of atenolol due to PVC's. Labwork was unremarkable with the exception of mildly abormal TSH followed closely by PCP. Most recent result normal. CRP and ESR normal.   He was evaluated by cardiology who reports normal finding. Cardiology sent him to ENT. 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 He has been treated for recurrent otitis media and diffuse otitis externa. He also has difficulty with impacted cerumen. When he has cerumen extraction, dizziness significantly worsens for about 5 minutes. He has been treated with multiple rounds on antibiotics orally, prednisone and abx drops. Last 08/07/2019. He has taken $RemoveB'5mg'IaFmtWes$  daily for about the past month, last dose yesterday. He has had previous concerns  of increased palpitations with steroid use. He does report mild palpitations with last round of prednisone. He reports that he feels that he has a rubber band around his head all the time. He gets dizzy when he stands up. When he is taking antibiotics and steroids, symptoms almost completely resolve. He has a fractured tooth. His dentist has reccommended having it extracted but he is hesitant to do so. He denies swelling or pain.   He gets 45-50 ounces of water daily. He does note tinnitus of right ear today. He is having a hearing test next week.  Ronald Russell continues to have concerns of pretty consistent dizziness and feeling "swimmy headed". He does also feel a tightness or rubber band sensation around his head when he is dizzy. It is worse with position changes and significantly improves with antibiotics and steroids. I have reviewed previous notes, labs and imaging. We have been unable to identify any particular neurological explanation of symptoms. Orthostatic blood pressures performed and 20 point drop notes of systolic pressure from sitting to standing but patient was not symptomatic with drop. Cardiology workup has been negative. ENT does not feel that right canal stenosis post mastoidectomy or recurrent ear infections are related to symptoms. He feels much better on prednisone but does not PVC's with continued use or high doses. I have discussed our findings from a neurologic stand point. I will refer him to vestibular therapy to see if this helps at all, although, it does not seem to be vertiginous. He will continue close follow up with PCP, cardiology and ENT as directed. He will stay well hydrated and monitor BP at home. Fall precautions advised. He will follow up with [place] in 6 months, sooner if needed. He and his wife verbalize understanding.     HPI 12/20/2018:  Ronald Fear Sr. is a 87 y.o. male here as requested by Baruch Gouty, FNP for dizziness and vertigo, past medical history of CABG x3,  coronary artery disease, thrombo-cytopenia, palpitations, hyperlipidemia, renal insufficiency, gout, postherpetic neuralgia, anxiety, benign paroxysmal positional vertigo, temporal arteritis, hypertension, symptomatic PVCs. Here with his lovely wife who also provides information. He says his headache is in temples and mostly on the left with a knot feeling, he had a biopsy which was negative. He has had temple for 30 years and he has ocular migraines as well. A biopsy by Dr. Lucia Gaskins was negative but Dr. Lucia Gaskins was very confident he had Temporal Arteritis. It is tender to touch. He has been on antibiotics and his dizziness feels better. He was hit with a branch in April he had 2 stitches, his "swimmy head" is better but that was the initial inciting event. He has PVCs. No jaw pain, no chewing difficulty, no vision changes. The headaches are not worse, he has them and they can last 5-30 minutes, pulsating and pounding, he can swishing in his ears and heartbeat in the temples. "swimmy head" mostly when getting up and standing. Also when turning over in bed. His biggest concern is his "  swimmy" head, he gets sick and his legs get weak, starts feeling light pass out, no spinning. No other focal neurologic deficits, associated symptoms, inciting events or modifiable factors.  Reviewed notes, labs and imaging from outside physicians, which showed:  I reviewed Dr. Livia Snellen notes patient presented with "swimmy headed" for 2 days.  Associated with nausea.  Started when he went to ENT for ear problems 4 to 5 months earlier.  CT review from April 20 confirmed ethmoid disease.  I reviewed notes from December 30, 2018 patient was seen in the emergency room 6 weeks of worsening palpitations, past 3 weeks palpitations worse and he is taking atenolol 12.5 mg daily and when he was seen he had been taking additional for the palpitations, he follows with cardiology regularly, cardiology seeing him for EKG, no shortness of breath or chest  pain or leg swelling orthopnea or paroxysmal dyspnea, he has been on antibiotics and steroids for the previous month due to sinus infection, the physician started him on meclizine.  Review of Systems: Patient complains of symptoms per HPI as well as the following symptoms: dizziness, headache. Pertinent negatives and positives per HPI. All others negative.  Review of Systems: Patient complains of symptoms per HPI as well as the following symptoms dizziness. Pertinent negatives and positives per HPI. All others negative.   Social History   Socioeconomic History   Marital status: Married    Spouse name: Ann   Number of children: 4   Years of education: GED   Highest education level: GED or equivalent  Occupational History   Occupation: Retired    Comment: Security  Tobacco Use   Smoking status: Former    Packs/day: 0.75    Years: 3.00    Pack years: 2.25    Types: Cigarettes   Smokeless tobacco: Never   Tobacco comments:    02/13/2013 "quit smoking age 70"  Vaping Use   Vaping Use: Never used  Substance and Sexual Activity   Alcohol use: No    Comment: 02/13/2013 "probably a pint of whiskey/wk up til I was probably 87 yr old"   Drug use: No   Sexual activity: Yes    Birth control/protection: None  Other Topics Concern   Not on file  Social History Narrative   Lives with wife   Caffeine use: 15-16oz soda per day, no soda   Usually drinks 60 oz water daily   Social Determinants of Health   Financial Resource Strain: Low Risk    Difficulty of Paying Living Expenses: Not hard at all  Food Insecurity: No Food Insecurity   Worried About Charity fundraiser in the Last Year: Never true   [place] in the Last Year: Never true  Transportation Needs: No Transportation Needs   Lack of Transportation (Medical): No   Lack of Transportation (Non-Medical): No  Physical Activity: Sufficiently Active   Days of Exercise per Week: 7 days   Minutes of Exercise per Session: 30 min   Stress: No Stress Concern Present   Feeling of Stress : Not at all  Social Connections: Socially Integrated   Frequency of Communication with Friends and Family: More than three times a week   Frequency of Social Gatherings with Friends and Family: More than three times a week   Attends Religious Services: More than 4 times per year   Active Member of [employer] or Organizations: Yes   Attends Archivist Meetings: More than 4 times per year  Marital Status: Married  Human resources officer Violence: Not At Risk   Fear of Current or Ex-Partner: No   Emotionally Abused: No   Physically Abused: No   Sexually Abused: No    Family History  Problem Relation Age of Onset   Cancer Mother        breast   Alzheimer's disease Father    Cancer Sister        Breast cancer   Cancer Sister        colon cancer   Anemia Neg Hx    Arrhythmia Neg Hx    Asthma Neg Hx    Clotting disorder Neg Hx    Fainting Neg Hx    Heart attack Neg Hx    Heart disease Neg Hx    Heart failure Neg Hx    Hyperlipidemia Neg Hx    Hypertension Neg Hx    Migraines Neg Hx     Past Medical History:  Diagnosis Date   Allergy    Anxiety    Bladder cancer ([place]) 2010   Tx with BCG   CAD (coronary artery disease)    LHC 6/19: pLAD 75, mLAD 100, D2 75; pLCx 80, mLCx 70, EF 55-65 >> s/p CABG // Echo 3/18: EF 60-65, Gr 2 DD   Chronic bronchitis ([place])    "yearly the last 3 yrs" (02/13/2013)   Dysrhythmia    GERD (gastroesophageal reflux disease)    History of blood transfusion 1949   "seeral" (02/13/2013)   HOH (hard of hearing)    Hypertension    Ocular migraine    "not often" (02/13/2013)   Pneumonia    "last time ~ 2 yr ago; had it before that too" (02/13/2013)   Seasonal allergies    Shingles    has neuropathy since it happened in 6/17   Temporal arteritis ([facility])    Thrombocytopenia ([place]) 11/29/2016    Patient Active Problem List   Diagnosis Date Noted   Dizziness 08/10/2021   Barrett's esophagus with  dysplasia 05/11/2021   Chest pain 01/21/2021   Statin myopathy 01/06/2021   Allergic rhinitis 12/03/2020   Bladder cancer ([place]) 12/03/2020   Family history of malignant neoplasm of gastrointestinal tract 12/03/2020   Hypertrophy of prostate without urinary obstruction and other lower urinary tract symptoms (LUTS) 12/03/2020   Mixed conductive and sensorineural hearing loss, unilateral, right ear with restricted hearing on the contralateral side 12/03/2020   New daily persistent headache (ndph) 12/03/2020   Encounter for immunization 12/03/2020   Personal history of colonic polyps 12/03/2020   Psychosexual dysfunction with inhibited sexual excitement 12/03/2020   History of bladder cancer 07/20/2019   S/P CABG x 3 12/07/2017   CAD (coronary artery disease)    Sebaceous cyst 09/20/2017   GERD without esophagitis 04/13/2017   Postoperative right upper quadrant abdominal pain 12/10/2016   S/P laparoscopic cholecystectomy 12/08/2016   History of right mastoidectomy 11/30/2016   Thrombocytopenia ([place]) 11/29/2016   Palpitations 09/01/2016   Renal insufficiency 09/01/2016   Hyperlipidemia 09/01/2016   Localized swelling of lower extremity 09/01/2016   Gout 05/20/2016   Post herpetic neuralgia 05/20/2016   GAD (generalized anxiety disorder) 01/27/2016   Vitamin D deficiency 11/06/2015   BPPV (benign paroxysmal positional vertigo) 09/30/2015   Immune deficiency disorder ([place]) 08/19/2015   HTN (hypertension), benign 04/18/2013   Temporal arteritis ([place]) 04/18/2013   Anxiety 04/18/2013   HOH (hard of hearing)    Vertigo 07/31/2011   Symptomatic PVCs 07/31/2011    Past  Surgical History:  Procedure Laterality Date   ARTERY BIOPSY Left 01/09/2013   Procedure: BIOPSY TEMPORAL ARTERY;  Surgeon: Rozetta Nunnery, MD;  Location: [facility];  Service: ENT;  Laterality: Left;   CARDIOVASCULAR STRESS TEST  07/2011   No evidence of ischemia; EF 79%   CHOLECYSTECTOMY N/A  11/30/2016   Procedure: LAPAROSCOPIC CHOLECYSTECTOMY;  Surgeon: Aviva Signs, MD;  Location: [facility];  Service: General;  Laterality: N/A;   COLONOSCOPY     CORONARY ARTERY BYPASS GRAFT N/A 12/07/2017   Procedure: CORONARY ARTERY BYPASS GRAFTING (CABG) times three using left internal mammary artery and left endoscopically harvested saphenous vein graft;  Surgeon: Melrose Nakayama, MD;  Location: [facility];  Service: Open Heart Surgery;  Laterality: N/A;   CYSTOSCOPY WITH BIOPSY N/A 04/21/2020   Procedure: CYSTOSCOPY WITH BLADDER BIOPSY/ FULGURATION;  Surgeon: Raynelle Bring, MD;  Location: [facility];  Service: Urology;  Laterality: N/A;  ONLY NEEDS 45 MIN   LEFT HEART CATH AND CORONARY ANGIOGRAPHY N/A 12/02/2017   Procedure: LEFT HEART CATH AND CORONARY ANGIOGRAPHY;  Surgeon: Jettie Booze, MD;  Location: [facility];  Service: Cardiovascular;  Laterality: N/A;   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[place]    lower lower leg burn; "probably 4-5 ORs in 1949 for this" (02/13/2013)   SKIN GRAFT Right    upper and lower leg   TEE WITHOUT CARDIOVERSION N/A 12/07/2017   Procedure: TRANSESOPHAGEAL ECHOCARDIOGRAM (TEE);  Surgeon: Melrose Nakayama, MD;  Location: [facility];  Service: Open Heart Surgery;  Laterality: N/A;   TONSILLECTOMY  1940's   TRANSURETHRAL RESECTION OF BLADDER TUMOR  2010 X 3   "cancer" (02/13/2013)   VASECTOMY      Current Outpatient Medications  Medication Sig Dispense Refill   acetaminophen (TYLENOL) 650 MG CR tablet Take 650 mg by mouth every 6 (six) hours as needed for pain.     acidophilus (RISAQUAD) CAPS capsule Take 1 capsule by mouth every evening.     aspirin EC 81 MG tablet Take 1 tablet (81 mg total) by mouth daily. 90 tablet 3   atenolol (TENORMIN) 25 MG tablet Take 1/2 tablet by mouth daily, may take additional 1/4-1/2 tablet daily as needed for palpitations 90 tablet 3   Cholecalciferol 50 MCG (2000 UT) TABS 50 mcg.     Cholecalciferol 50 MCG  (2000 UT) TABS Take by mouth.     Cyanocobalamin (VITAMIN B-12 PO) Take 1 tablet by mouth daily.     Evolocumab (REPATHA SURECLICK) 161 MG/ML SOAJ Inject 1 pen into the skin every 14 (fourteen) days. 6 mL 3   fluocinonide cream (LIDEX) 0.96 % Apply 1 application topically 2 (two) times daily. 30 g 0   hydrocortisone (ANUSOL-HC) 25 MG suppository Place 1 suppository (25 mg total) rectally 2 (two) times daily. 12 suppository 0   lansoprazole (PREVACID) 15 MG capsule Take 15 mg by mouth daily at 12 noon.     Potassium Gluconate 550 MG TABS Take 1 tablet (550 mg total) by mouth at bedtime. Need to take 1 tab one day and 2 the next day alternating (Patient taking differently: Take 550 mg by mouth at bedtime.) 30 tablet 0   psyllium (METAMUCIL) 58.6 % powder Take 1 packet by mouth daily.     sildenafil (REVATIO) 20 MG tablet Take 20 mg by mouth as needed.     Vitamin D, Cholecalciferol, 10 MCG (400 UNIT) CAPS Take 400 Units by mouth daily.  100 capsule 12   No current facility-administered medications for this visit.    Allergies as of 08/10/2021 - Review Complete 08/10/2021  Allergen Reaction Noted   Uloric [febuxostat] Palpitations, Other (See Comments), and Hypertension 02/07/2017   Doxazosin Diarrhea 05/20/2004   Doxycycline Diarrhea 04/21/2020   Omeprazole Other (See Comments) 07/24/2020   Pantoprazole Other (See Comments) 07/24/2020   Augmentin [amoxicillin-pot clavulanate] Diarrhea 08/16/2016   Ciprofloxacin Diarrhea 01/13/2016   Codeine Nausea And Vomiting and Other (See Comments) 07/31/2011   Levaquin [levofloxacin] Diarrhea 11/24/2018   Oxycodone Nausea And Vomiting 01/04/2013   Tramadol Nausea And Vomiting 12/01/2017   Vicodin [hydrocodone-acetaminophen] Nausea And Vomiting 01/04/2013    Vitals: BP 121/67    Pulse 63    Wt 197 lb 6.4 oz (89.5 kg)    BMI 26.77 kg/m  Last Weight:  Wt Readings from Last 1 Encounters:  08/10/21 197 lb 6.4 oz (89.5 kg)   Last Height:   Ht  Readings from Last 1 Encounters:  07/31/21 6' (1.829 m)    Exam: NAD, pleasant                  Speech:    Speech is normal; fluent and spontaneous with normal comprehension.  Cognition:    The patient is oriented to person, place, and time;     recent and remote memory intact;     language fluent;    Cranial Nerves:    The pupils are equal, round, and reactive to light.Trigeminal sensation is intact and the muscles of mastication are normal. The face is symmetric. The palate elevates in the midline. Hearing intact. Voice is normal. Shoulder shrug is normal. The tongue has normal motion without fasciculations.   Coordination:  No dysmetria  Motor Observation:    No asymmetry, no atrophy, and no involuntary movements noted. Tone:    Normal muscle tone.     Strength:    Strength is V/V in the upper and lower limbs.      Sensation: intact to LT  DTRs: absent AJs, brisk otherwise.     Assessment/Plan:   87 y.o. male here as requested by Claretta Fraise, MD for follow up of dizziness, past medical history of CABG x3, coronary artery disease, thrombo-cytopenia, palpitations, hyperlipidemia, renal insufficiency, gout, postherpetic neuralgia, anxiety, benign paroxysmal positional vertigo, temporal arteritis, hypertension, symptomatic PVCs. He was orthostatic on exam with reproduction of symptoms.Patient is here for follow-up of dizziness.  I evaluated him in 2020, he was seen in follow-up by my nurse practitioner.  We did discover that he was orthostatic his systolic blood pressure dropped by up to 20 points,(Orthostatics take 2: sitting: 133/78 P 66, Standing: 117/68 P 69) MRI and MRA was unchanged, CRP and ESR was normal initially as well as recently 02/2021, he follows with cardiology and ENT, he reported getting dizzy when he stands up, we have been unable to find any primary neurologic cause for his dizziness, at last appointment we sent him to physical therapy for vestibular therapy.    if he shakes his head he gets a little dizzy "swimmy head", I explained we have evaluated him extensively and I do not think this is of primary neurologic pathology. His left ear "goes crazy", when he turns too quickly to one side it makes him dizzy. He had surgery on his ear and goes to see ENT on the 14th, Same symptoms for years, he was feeling better before the recet surgery on his ear and he has been worse  since then(follow up with ENT). I think symptoms multifactorial due to inner ear issues as well as labile BP and pulse and is working with cardiology(a few days ago was orthostatic in cardiology appointment), not neurologic primarily. Atenolol makes him dizzy likely causes some low blood pressure or low pulse, that is being changed by cardiology, he is not wearing compression stockings today(recommended), he has PVCs and difficult to manage due to bradycardia and he is working on it with cardiology.   I reviewed cardiology notes from August 06, 2021, patient stated then that he was feeling pretty good, only some dizziness when standing quickly, he has been working with cardiology for management of his medication. He was still orthostatic a few days ago:  BP 142/70  P 54   Sitting    118/60   P 71   Standing  116/60  P 59   Standing 4 mint   130/78   P 59    - He has had inner ear issues, recent surgery with worsening of symptoms on movement, feel this is inner ear etiology also some orthostasis/hypotension, he has an appointment with [facility] on 14th with ENT.  - I recommended PT again, using a walking aid, conservatives measures, anything that makes him feel very dizzy to avoid, he feels like he gets lightheaded and the "world is coming in on him" may be BP decreasing/orthostatic hypotension.   I have no further evaluation from neurology standpoint. Return to primary care. Esr/crp was negative 02/2021 and when I saw him in the past.   Answered all questions, wife was her as well, explained  above   Cc: Rakes, Connye Burkitt, [place],  [place], Connye Burkitt, [place], [facility] [address] [place], [place] [address]  Phone [phone] Fax [phone]  I spent over 50 minutes of face-to-face and non-face-to-face time with patient on the  1. Orthostatic hypotension   2. Dizziness    diagnosis.  This included previsit chart review, lab review, study review, order entry, electronic health record documentation, patient education on the different diagnostic and therapeutic options, counseling and coordination of care, risks and benefits of management, compliance, or risk factor reduction

## 2021-08-10 NOTE — Patient Instructions (Addendum)
°  °  Non-Drug Treatment for Low Blood Pressure on Standing:  1. Changing Postures:  Change posture slowly when getting up, especially in the morning  Hold on to something during the first few minutes after standing up. Do not start walking as soon as you get up from the chair  Avoid prolonged recumbency or lying down  Raise the head of the bed by 10 to 20 degrees  2. Exercise:  Perform Isotonic exercise, e.g.recumbent bike, pedaling movements while sitting in a chair  Avoid exercises where you have to strain   3. Avoid Pooling of blood in legs:  Wear custom-fitted elastic stockings. The ones which extend to the abdomen work even better. Consider wearing an abdominal binder.  Perform physical counter-maneuvers, such as crossing legs and tensing leg muscles.  4. Eating and Drinking:  Small meals are recommended. Avoid large meals. Avoid standing suddenly after a large meal  Avoid alcohol  Increase intake of fluids and regular salt. A daily intake of up to 10 grams of sodium per day and a fluid intake of 2.0 to 2.5 liters per day (8 to 10 glasses of water) is recommended.   Rapid (over 3 minutes) ingestion of approximately 0.5 liter (2 glasses of water) of tap water, raises blood pressure within 5 to 15 minutes and lasts for an hour.  5. Other Tips:  Avoid hot baths. Instead take warm baths  Maintain a BP record standing and lying down  If you are only any BP lowering drugs (antihypertensives, diuretics, antidepressants, drugs for prostate, etc) ask your doctor to revisit the need to keep you on these drugs  If all non-drug therapy fails, ask your doctor about drug therapy   Follow-up with primary care physician.

## 2021-08-20 ENCOUNTER — Ambulatory Visit (INDEPENDENT_AMBULATORY_CARE_PROVIDER_SITE_OTHER): Payer: PPO | Admitting: Family Medicine

## 2021-08-20 ENCOUNTER — Encounter: Payer: Self-pay | Admitting: Family Medicine

## 2021-08-20 VITALS — BP 122/69 | HR 64 | Temp 97.8°F | Resp 20 | Ht 72.0 in | Wt 199.0 lb

## 2021-08-20 DIAGNOSIS — L858 Other specified epidermal thickening: Secondary | ICD-10-CM | POA: Diagnosis not present

## 2021-08-20 NOTE — Progress Notes (Addendum)
Subjective:  Patient ID: Ronald Morrow., male    DOB: [birth year], 87 y.o.   MRN: [id number]  Patient Care Team: Baruch Gouty, FNP as PCP - General (Family Medicine) Fay Records, MD as PCP - Cardiology (Cardiology) Burtis Junes, NP (Inactive) as Nurse Practitioner (Nurse Practitioner) Ilean China, RN as Registered Nurse Ilean China, RN as Registered Nurse   Chief Complaint:  Rash and knot on left wrist  (Noticed about 6-8 months ago (stings) )   HPI: Ronald Olmo. is a 87 y.o. male presenting on 08/20/2021 for Rash and knot on left wrist  (Noticed about 6-8 months ago (stings) )   Pt presents with concerns of rash for the past 3 weeks. He has used his wife's triamcinalone for with moderate relief.  The rash is on the upper back and chest. He states it is itchy and burns when he applies his Jergens lotion to the area.   Rash This is a recurrent problem. The current episode started more than 1 month ago. The problem has been waxing and waning since onset. The affected locations include the torso, back and abdomen. The rash is characterized by redness and itchiness. He was exposed to nothing. Pertinent negatives include no cough, diarrhea, fatigue, fever, shortness of breath or vomiting. Past treatments include anti-itch cream. The treatment provided no relief.    Relevant past medical, surgical, family, and social history reviewed and updated as indicated.  Allergies and medications reviewed and updated. Data reviewed: Chart in Epic.   Past Medical History:  Diagnosis Date   Allergy    Anxiety    Bladder cancer ([place]) 2010   Tx with BCG   CAD (coronary artery disease)    LHC 6/19: pLAD 75, mLAD 100, D2 75; pLCx 80, mLCx 70, EF 55-65 >> s/p CABG // Echo 3/18: EF 60-65, Gr 2 DD   Chronic bronchitis ([place])    "yearly the last 3 yrs" (02/13/2013)   Dysrhythmia    GERD (gastroesophageal reflux disease)    History of blood transfusion 1949   "seeral" (02/13/2013)    HOH (hard of hearing)    Hypertension    Ocular migraine    "not often" (02/13/2013)   Pneumonia    "last time ~ 2 yr ago; had it before that too" (02/13/2013)   Seasonal allergies    Shingles    has neuropathy since it happened in 6/17   Temporal arteritis ([place])    Thrombocytopenia ([place]) 11/29/2016    Past Surgical History:  Procedure Laterality Date   ARTERY BIOPSY Left 01/09/2013   Procedure: BIOPSY TEMPORAL ARTERY;  Surgeon: Rozetta Nunnery, MD;  Location: [facility];  Service: ENT;  Laterality: Left;   CARDIOVASCULAR STRESS TEST  07/2011   No evidence of ischemia; EF 79%   CHOLECYSTECTOMY N/A 11/30/2016   Procedure: LAPAROSCOPIC CHOLECYSTECTOMY;  Surgeon: Aviva Signs, MD;  Location: [facility];  Service: General;  Laterality: N/A;   COLONOSCOPY     CORONARY ARTERY BYPASS GRAFT N/A 12/07/2017   Procedure: CORONARY ARTERY BYPASS GRAFTING (CABG) times three using left internal mammary artery and left endoscopically harvested saphenous vein graft;  Surgeon: Melrose Nakayama, MD;  Location: [facility];  Service: Open Heart Surgery;  Laterality: N/A;   CYSTOSCOPY WITH BIOPSY N/A 04/21/2020   Procedure: CYSTOSCOPY WITH BLADDER BIOPSY/ FULGURATION;  Surgeon: Raynelle Bring, MD;  Location: [facility];  Service: Urology;  Laterality: N/A;  ONLY NEEDS 45  MIN   LEFT HEART CATH AND CORONARY ANGIOGRAPHY N/A 12/02/2017   Procedure: LEFT HEART CATH AND CORONARY ANGIOGRAPHY;  Surgeon: Jettie Booze, MD;  Location: [facility];  Service: Cardiovascular;  Laterality: N/A;   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[place]    lower lower leg burn; "probably 4-5 ORs in 1949 for this" (02/13/2013)   SKIN GRAFT Right    upper and lower leg   TEE WITHOUT CARDIOVERSION N/A 12/07/2017   Procedure: TRANSESOPHAGEAL ECHOCARDIOGRAM (TEE);  Surgeon: Melrose Nakayama, MD;  Location: [facility];  Service: Open Heart Surgery;  Laterality: N/A;   TONSILLECTOMY  1940's   TRANSURETHRAL  RESECTION OF BLADDER TUMOR  2010 X 3   "cancer" (02/13/2013)   VASECTOMY      Social History   Socioeconomic History   Marital status: Married    Spouse name: Ronald Russell   Number of children: 4   Years of education: GED   Highest education level: GED or equivalent  Occupational History   Occupation: Retired    Comment: Security  Tobacco Use   Smoking status: Former    Packs/day: 0.75    Years: 3.00    Pack years: 2.25    Types: Cigarettes   Smokeless tobacco: Never   Tobacco comments:    02/13/2013 "quit smoking age 52"  Vaping Use   Vaping Use: Never used  Substance and Sexual Activity   Alcohol use: No    Comment: 02/13/2013 "probably a pint of whiskey/wk up til I was probably 87 yr old"   Drug use: No   Sexual activity: Yes    Birth control/protection: None  Other Topics Concern   Not on file  Social History Narrative   Lives with wife   Caffeine use: 15-16oz soda per day, no soda   Usually drinks 60 oz water daily   Social Determinants of Health   Financial Resource Strain: Low Risk    Difficulty of Paying Living Expenses: Not hard at all  Food Insecurity: No Food Insecurity   Worried About Charity fundraiser in the Last Year: Never true   [place] in the Last Year: Never true  Transportation Needs: No Transportation Needs   Lack of Transportation (Medical): No   Lack of Transportation (Non-Medical): No  Physical Activity: Sufficiently Active   Days of Exercise per Week: 7 days   Minutes of Exercise per Session: 30 min  Stress: No Stress Concern Present   Feeling of Stress : Not at all  Social Connections: Socially Integrated   Frequency of Communication with Friends and Family: More than three times a week   Frequency of Social Gatherings with Friends and Family: More than three times a week   Attends Religious Services: More than 4 times per year   Active Member of [employer] or Organizations: Yes   Attends Music therapist: More than 4 times per  year   Marital Status: Married  Human resources officer Violence: Not At Risk   Fear of Current or Ex-Partner: No   Emotionally Abused: No   Physically Abused: No   Sexually Abused: No    Outpatient Encounter Medications as of 08/20/2021  Medication Sig   aspirin EC 81 MG tablet Take 1 tablet (81 mg total) by mouth daily.   atenolol (TENORMIN) 25 MG tablet Take 1/2 tablet by mouth daily, may take additional 1/4-1/2 tablet daily as needed for palpitations   Cyanocobalamin (VITAMIN B-12 PO) Take  1 tablet by mouth daily.   Evolocumab (REPATHA SURECLICK) 469 MG/ML SOAJ Inject 1 pen into the skin every 14 (fourteen) days.   lansoprazole (PREVACID) 15 MG capsule Take 15 mg by mouth daily at 12 noon.   Potassium Gluconate 550 MG TABS Take 1 tablet (550 mg total) by mouth at bedtime. Need to take 1 tab one day and 2 the next day alternating (Patient taking differently: Take 550 mg by mouth at bedtime.)   psyllium (METAMUCIL) 58.6 % powder Take 1 packet by mouth daily.   Vitamin D, Cholecalciferol, 10 MCG (400 UNIT) CAPS Take 400 Units by mouth daily.   acetaminophen (TYLENOL) 650 MG CR tablet Take 650 mg by mouth every 6 (six) hours as needed for pain. (Patient not taking: Reported on 08/20/2021)   acidophilus (RISAQUAD) CAPS capsule Take 1 capsule by mouth every evening. (Patient not taking: Reported on 08/20/2021)   fluocinonide cream (LIDEX) 6.29 % Apply 1 application topically 2 (two) times daily. (Patient not taking: Reported on 08/20/2021)   hydrocortisone (ANUSOL-HC) 25 MG suppository Place 1 suppository (25 mg total) rectally 2 (two) times daily. (Patient not taking: Reported on 08/20/2021)   sildenafil (REVATIO) 20 MG tablet Take 20 mg by mouth as needed. (Patient not taking: Reported on 08/20/2021)   [DISCONTINUED] Cholecalciferol 50 MCG (2000 UT) TABS 50 mcg.   [DISCONTINUED] Cholecalciferol 50 MCG (2000 UT) TABS Take by mouth.   No facility-administered encounter medications on file as of 08/20/2021.     Allergies  Allergen Reactions   Uloric [Febuxostat] Palpitations, Other (See Comments) and Hypertension    Hypertension with palpitations and a sense of fuzziness and dizziness at the left side of his head   Doxazosin Diarrhea   Doxycycline Diarrhea   Omeprazole Other (See Comments)   Pantoprazole Other (See Comments)    Can tolerate 20mg    Augmentin [Amoxicillin-Pot Clavulanate] Diarrhea   Ciprofloxacin Diarrhea   Codeine Nausea And Vomiting and Other (See Comments)    Severe headache   Levaquin [Levofloxacin] Diarrhea   Oxycodone Nausea And Vomiting   Tramadol Nausea And Vomiting   Vicodin [Hydrocodone-Acetaminophen] Nausea And Vomiting    Review of Systems  Constitutional:  Negative for activity change, appetite change, chills, fatigue and fever.  HENT: Negative.    Eyes: Negative.   Respiratory:  Negative for cough, chest tightness and shortness of breath.   Cardiovascular:  Negative for chest pain, palpitations and leg swelling.  Gastrointestinal:  Negative for blood in stool, constipation, diarrhea, nausea and vomiting.  Endocrine: Negative.   Genitourinary:  Negative for dysuria, frequency and urgency.  Musculoskeletal:  Negative for arthralgias and myalgias.  Skin:  Positive for rash.  Allergic/Immunologic: Negative.   Neurological:  Negative for dizziness and headaches.  Hematological: Negative.   Psychiatric/Behavioral:  Negative for confusion, hallucinations, sleep disturbance and suicidal ideas.   All other systems reviewed and are negative.      Objective:  BP 122/69    Pulse 64    Temp 97.8 F (36.6 C)    Resp 20    Ht 6' (1.829 m)    Wt 90.3 kg    SpO2 96%    BMI 26.99 kg/m    Wt Readings from Last 3 Encounters:  08/20/21 90.3 kg  08/10/21 89.5 kg  07/31/21 89.4 kg    Physical Exam Vitals and nursing note reviewed.  Constitutional:      Appearance: Normal appearance. He is normal weight.  HENT:     Head: Normocephalic and  atraumatic.  Eyes:      Pupils: Pupils are equal, round, and reactive to light.  Cardiovascular:     Rate and Rhythm: Normal rate and regular rhythm.  Pulmonary:     Effort: Pulmonary effort is normal.     Breath sounds: Normal breath sounds.  Musculoskeletal:     Cervical back: Normal range of motion.  Skin:    General: Skin is warm and dry.     Capillary Refill: Capillary refill takes less than 2 seconds.     Findings: Rash present.       Neurological:     General: No focal deficit present.     Mental Status: He is alert and oriented to person, place, and time. Mental status is at baseline.  Psychiatric:        Mood and Affect: Mood normal.        Behavior: Behavior normal.        Thought Content: Thought content normal.        Judgment: Judgment normal.    Results for orders placed or performed during the hospital encounter of 07/31/21  Vitamin D (25 hydroxy)  Result Value Ref Range   Vit D, 25-Hydroxy 49.07 30 - 100 ng/mL  Basic metabolic panel  Result Value Ref Range   Sodium 140 135 - 145 mmol/L   Potassium 4.0 3.5 - 5.1 mmol/L   Chloride 110 98 - 111 mmol/L   CO2 20 (L) 22 - 32 mmol/L   Glucose, Bld 107 (H) 70 - 99 mg/dL   BUN 18 8 - 23 mg/dL   Creatinine, Ser 1.40 (H) 0.61 - 1.24 mg/dL   Calcium 9.0 8.9 - 10.3 mg/dL   GFR, Estimated 49 (L) >60 mL/min   Anion gap 10 5 - 15       Pertinent labs & imaging results that were available during my care of the patient were reviewed by me and considered in my medical decision making.  Assessment & Plan:  Ronald Russell was seen today for rash and knot on left wrist .  Diagnoses and all orders for this visit:  KP (keratosis pilaris) - Patient educated to use humectants such as CeraVe to repair moisture barrier. Educated on proper skin hygiene and skin changes as we age.      Continue all other maintenance medications.  Follow up plan: Return if symptoms worsen or fail to improve.   Continue healthy lifestyle choices, including diet  (rich in fruits, vegetables, and lean proteins, and low in salt and simple carbohydrates) and exercise (at least 30 minutes of moderate physical activity daily).   The above assessment and management plan was discussed with the patient. The patient verbalized understanding of and has agreed to the management plan. Patient is aware to call the clinic if they develop any new symptoms or if symptoms persist or worsen. Patient is aware when to return to the clinic for a follow-up visit. Patient educated on when it is appropriate to go to the emergency department.   Ronald Ona Rathert, Ronald Russell  I personally was present during the history, physical exam, and medical decision-making activities of this visit and have verified that the services and findings are accurately documented in the nurse practitioner student's note.  Monia Pouch, FNP-C [facility] [address] [place], [place] [address] [phone]

## 2021-08-24 ENCOUNTER — Telehealth: Payer: Self-pay | Admitting: Internal Medicine

## 2021-08-24 DIAGNOSIS — E782 Mixed hyperlipidemia: Secondary | ICD-10-CM

## 2021-08-24 NOTE — Telephone Encounter (Signed)
Pt c/o medication issue: ? ?1. Name of Medication: atenolol (TENORMIN) 25 MG tablet ? ?2. How are you currently taking this medication (dosage and times per day)? 1/4 of a tablet at night ? ?3. Are you having a reaction (difficulty breathing--STAT)?  ? ?4. What is your medication issue?  ? ?Pt said that he only takes 1/4 of a tablet every night and he said he's feeling much better. He wanted to let Dr. Harrington Challenger know ?

## 2021-08-25 NOTE — Telephone Encounter (Signed)
Pt called to report that since he had decreased his Atenolol to 1/4 at night he has had much improvement with his light headedness... he says his palpitations and still been under good control.  ? ?He has 2 questions for Dr. Harrington Challenger:  ? ?He was told by an NP at [facility] that it is a necessity that he stays on the Atenolol that he could "die" without it? ? ?Also, when would she like a new Lipid test? He has 2 shots left of his Repatha.  ? ? ? ? ? ? ?

## 2021-08-25 NOTE — Telephone Encounter (Signed)
Good to stay on 1/4 atenolol   He will not die if he stops taking    ?Set up for lipid panel ?

## 2021-08-28 MED ORDER — ATENOLOL 25 MG PO TABS: ORAL_TABLET | ORAL | 3 refills | Status: DC

## 2021-08-28 NOTE — Telephone Encounter (Addendum)
Spoke with the and he agrees to Dr. Harrington Challenger' recommendations... he will have labs drawn 09/04/21 at [facility].  ?

## 2021-08-31 NOTE — Telephone Encounter (Signed)
Lab order placed for [facility]. The lab that [facility] uses. Pt notified of date and that he to to go fasting.  ?

## 2021-09-04 ENCOUNTER — Other Ambulatory Visit: Payer: PPO

## 2021-09-04 DIAGNOSIS — E782 Mixed hyperlipidemia: Secondary | ICD-10-CM | POA: Diagnosis not present

## 2021-09-05 LAB — LIPID PANEL
Chol/HDL Ratio: 2.2 ratio (ref 0.0–5.0)
Cholesterol, Total: 82 mg/dL — ABNORMAL LOW (ref 100–199)
HDL: 38 mg/dL — ABNORMAL LOW (ref >39)
LDL Chol Calc (NIH): 26 mg/dL (ref 0–99)
Triglycerides: 91 mg/dL (ref 0–149)
VLDL Cholesterol Cal: 18 mg/dL (ref 5–40)

## 2021-09-07 ENCOUNTER — Telehealth: Payer: Self-pay | Admitting: Internal Medicine

## 2021-09-07 MED ORDER — REPATHA SURECLICK 140 MG/ML ~~LOC~~ SOAJ: 1.0000 "pen " | SUBCUTANEOUS | 3 refills | Status: DC

## 2021-09-07 NOTE — Telephone Encounter (Signed)
Patient calling in regarding his cholesterol and if he should continue to take the medication. Please advise ?

## 2021-09-07 NOTE — Telephone Encounter (Signed)
Per Dr. Harrington Challenger: ? ?Lipids are excellent  Keep on same regimen ?

## 2021-09-07 NOTE — Telephone Encounter (Signed)
Pt notifed and verbalized understanding. Pt asked for Repatha refill to be sent to [facility] ?

## 2021-09-11 ENCOUNTER — Telehealth: Payer: Self-pay | Admitting: Internal Medicine

## 2021-09-11 MED ORDER — REPATHA SURECLICK 140 MG/ML ~~LOC~~ SOAJ: 140.0000 mg | SUBCUTANEOUS | 3 refills | Status: DC

## 2021-09-11 MED ORDER — REPATHA SURECLICK 140 MG/ML ~~LOC~~ SOAJ: 1.0000 "pen " | SUBCUTANEOUS | 11 refills | Status: DC

## 2021-09-11 NOTE — Telephone Encounter (Signed)
?*  STAT* If patient is at the pharmacy, call can be transferred to refill team. ? ? ?1. Which medications need to be refilled? (please list name of each medication and dose if known) Evolocumab (REPATHA SURECLICK) 704 MG/ML SOAJ ? ?2. Which pharmacy/location (including street and city if local pharmacy) is medication to be sent to? [place], [address] ? ?3. Do they need a 30 day or 90 day supply? 30  ?

## 2021-09-11 NOTE — Telephone Encounter (Addendum)
Below message does not make any sense. Called pt to clarify what the issue is. ? ?He states that the [place] doesn't have Repatha on formulary but does have Praluent on formulary. Would need to fax all clinical info to the Dr Mosetta Pigeon at the [facility] at [phone] before they cover the injections. ? ?Pt has been taking Repatha for a year and [facility] his [place] [employer] insurance. He now wants to get his PCSK9i for free through the [place], however they need to approve the request first. Will fax over clinical info as well as labs with request for Praluent 75mg  Q2W since that seems to be their preferred PCSK9i. ? ?Also [facility] in the mean time for him to pick up a refill in case it takes the [place] a long time to process the request for Praluent. Will need med list updated if he changes PCSK9i/the [facility] decides to cover Praluent. ?

## 2021-09-11 NOTE — Telephone Encounter (Signed)
Patient is call back, he states the prescription is written wrong. He states its supposed to be be [facility] and not a show. Please advise  ?

## 2021-09-11 NOTE — Addendum Note (Signed)
Addended by: Dlynn Ranes E on: 09/11/2021 03:50 PM ? ? Modules accepted: Orders ? ?

## 2021-09-11 NOTE — Telephone Encounter (Signed)
Refill sent in

## 2021-09-17 NOTE — Telephone Encounter (Signed)
? ?  Pt is calling back to follow up. He said [facility] because of high [facility]-pay. He said the [place] needs more info why he needs to take repatha instead of a pill form  ?

## 2021-09-17 NOTE — Telephone Encounter (Signed)
Called pt, he states the [place] didn't get rx or chart notes. Again confirmed fax # and MD with pt which are correct. Will refax info I sent over to the [place] a week ago. He will follow up with them. ?

## 2021-10-02 ENCOUNTER — Other Ambulatory Visit: Payer: Self-pay | Admitting: Family Medicine

## 2021-10-02 DIAGNOSIS — K219 Gastro-esophageal reflux disease without esophagitis: Secondary | ICD-10-CM

## 2021-10-06 ENCOUNTER — Encounter: Payer: Self-pay | Admitting: Family Medicine

## 2021-10-06 ENCOUNTER — Ambulatory Visit (INDEPENDENT_AMBULATORY_CARE_PROVIDER_SITE_OTHER): Payer: PPO | Admitting: Family Medicine

## 2021-10-06 VITALS — BP 133/72 | HR 59 | Ht 72.0 in | Wt 195.0 lb

## 2021-10-06 DIAGNOSIS — L740 Miliaria rubra: Secondary | ICD-10-CM | POA: Diagnosis not present

## 2021-10-06 DIAGNOSIS — K529 Noninfective gastroenteritis and colitis, unspecified: Secondary | ICD-10-CM | POA: Diagnosis not present

## 2021-10-06 DIAGNOSIS — K219 Gastro-esophageal reflux disease without esophagitis: Secondary | ICD-10-CM | POA: Diagnosis not present

## 2021-10-06 DIAGNOSIS — E559 Vitamin D deficiency, unspecified: Secondary | ICD-10-CM

## 2021-10-06 MED ORDER — TRIAMCINOLONE ACETONIDE 0.1 % EX CREA: 1.0000 "application " | TOPICAL_CREAM | Freq: Two times a day (BID) | CUTANEOUS | 0 refills | Status: DC

## 2021-10-06 MED ORDER — LANSOPRAZOLE 15 MG PO CPDR: 15.0000 mg | DELAYED_RELEASE_CAPSULE | Freq: Every day | ORAL | 3 refills | Status: DC
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
## 2021-10-08 LAB — CBC WITH DIFFERENTIAL/PLATELET
Basophils Absolute: 0.1 10*3/uL (ref 0.0–0.2)
Basos: 1 %
EOS (ABSOLUTE): 0.2 10*3/uL (ref 0.0–0.4)
Eos: 3 %
Hematocrit: 46.1 % (ref 37.5–51.0)
Hemoglobin: 15.8 g/dL (ref 13.0–17.7)
Immature Grans (Abs): 0 10*3/uL (ref 0.0–0.1)
Immature Granulocytes: 0 %
Lymphocytes Absolute: 1.3 10*3/uL (ref 0.7–3.1)
Lymphs: 21 %
MCH: 31.2 pg (ref 26.6–33.0)
MCHC: 34.3 g/dL (ref 31.5–35.7)
MCV: 91 fL (ref 79–97)
Monocytes Absolute: 0.6 10*3/uL (ref 0.1–0.9)
Monocytes: 11 %
Neutrophils Absolute: 3.8 10*3/uL (ref 1.4–7.0)
Neutrophils: 64 %
Platelets: 132 10*3/uL — ABNORMAL LOW (ref 150–450)
RBC: 5.06 x10E6/uL (ref 4.14–5.80)
RDW: 13.6 % (ref 11.6–15.4)
WBC: 5.9 10*3/uL (ref 3.4–10.8)

## 2021-10-08 LAB — H PYLORI, IGM, IGG, IGA AB
H pylori, IgM Abs: <9 units (ref 0.0–8.9)
H. pylori, IgA Abs: <9 units (ref 0.0–8.9)
H. pylori, IgG AbS: 0.76 Index Value (ref 0.00–0.79)

## 2021-10-08 LAB — BMP8+EGFR
BUN/Creatinine Ratio: 9 — ABNORMAL LOW (ref 10–24)
BUN: 14 mg/dL (ref 8–27)
CO2: 24 mmol/L (ref 20–29)
Calcium: 9.4 mg/dL (ref 8.6–10.2)
Chloride: 106 mmol/L (ref 96–106)
Creatinine, Ser: 1.49 mg/dL — ABNORMAL HIGH (ref 0.76–1.27)
Glucose: 100 mg/dL — ABNORMAL HIGH (ref 70–99)
Potassium: 4.4 mmol/L (ref 3.5–5.2)
Sodium: 143 mmol/L (ref 134–144)
eGFR: 46 mL/min/{1.73_m2} — ABNORMAL LOW (ref >59)

## 2021-10-08 LAB — VITAMIN D 25 HYDROXY (VIT D DEFICIENCY, FRACTURES): Vit D, 25-Hydroxy: 49.7 ng/mL (ref 30.0–100.0)

## 2021-10-13 ENCOUNTER — Encounter (HOSPITAL_COMMUNITY): Payer: Self-pay | Admitting: Emergency Medicine

## 2021-10-13 ENCOUNTER — Telehealth: Payer: Self-pay | Admitting: Family Medicine

## 2021-10-13 ENCOUNTER — Emergency Department (HOSPITAL_COMMUNITY)
Admission: EM | Admit: 2021-10-13 | Discharge: 2021-10-13 | Disposition: A | Discharge status: Home / Self Care | Payer: PPO | Attending: Emergency Medicine | Admitting: Emergency Medicine

## 2021-10-13 ENCOUNTER — Other Ambulatory Visit: Payer: Self-pay

## 2021-10-13 DIAGNOSIS — Z7982 Long term (current) use of aspirin: Secondary | ICD-10-CM | POA: Diagnosis not present

## 2021-10-13 DIAGNOSIS — S20361A Insect bite (nonvenomous) of right front wall of thorax, initial encounter: Secondary | ICD-10-CM | POA: Insufficient documentation

## 2021-10-13 DIAGNOSIS — S40861A Insect bite (nonvenomous) of right upper arm, initial encounter: Secondary | ICD-10-CM | POA: Diagnosis not present

## 2021-10-13 DIAGNOSIS — S20461A Insect bite (nonvenomous) of right back wall of thorax, initial encounter: Secondary | ICD-10-CM | POA: Diagnosis not present

## 2021-10-13 DIAGNOSIS — W57XXXA Bitten or stung by nonvenomous insect and other nonvenomous arthropods, initial encounter: Secondary | ICD-10-CM | POA: Insufficient documentation

## 2021-10-13 DIAGNOSIS — S29001A Unspecified injury of muscle and tendon of front wall of thorax, initial encounter: Secondary | ICD-10-CM | POA: Diagnosis present

## 2021-10-13 MED ORDER — DOXYCYCLINE HYCLATE 100 MG PO CAPS: 100.0000 mg | ORAL_CAPSULE | Freq: Two times a day (BID) | ORAL | 0 refills | Status: DC

## 2021-10-13 MED ORDER — DOXYCYCLINE HYCLATE 100 MG PO TABS
100.0000 mg | ORAL_TABLET | Freq: Once | ORAL | Status: AC
  Administered 2021-10-13: 100 mg via ORAL
  Filled 2021-10-13: qty 1

## 2021-10-13 NOTE — ED Provider Notes (Signed)
? ?[facility]  ?Provider Note ? ?CSN: [id number] ?Arrival date & time: 10/13/21 0127 ? ?History ?Chief Complaint  ?Patient presents with  ? Insect Bite  ? ? ?Ronald Fear Sr. is a 87 y.o. male reports he took a tick off his R chest wall/axilla yesterday. It had been on for 2 days. He has noticed a red rash and some burning to that area since removal. No fever, headache or neck stiffness.  ? ? ?Home Medications ?Prior to Admission medications   ?Medication Sig Start Date End Date Taking? Authorizing Provider  ?doxycycline (VIBRAMYCIN) 100 MG capsule Take 1 capsule (100 mg total) by mouth 2 (two) times daily. 10/13/21  Yes Truddie Hidden, MD  ?acetaminophen (TYLENOL) 650 MG CR tablet Take 650 mg by mouth every 6 (six) hours as needed for pain.    [provider]  ?acidophilus (RISAQUAD) CAPS capsule Take 1 capsule by mouth every evening.    [provider]  ?aspirin EC 81 MG tablet Take 1 tablet (81 mg total) by mouth daily. 11/25/17   Jettie Booze, MD  ?atenolol (TENORMIN) 25 MG tablet Take 1/4 tablet by mouth daily, may take additional 1/4  tablet daily as needed for palpitations 08/28/21   Fay Records, MD  ?Cyanocobalamin (VITAMIN B-12 PO) Take 1 tablet by mouth daily.    [provider]  ?fluocinonide cream (LIDEX) 9.92 % Apply 1 application topically 2 (two) times daily. 01/27/21   Fay Records, MD  ?hydrocortisone (ANUSOL-HC) 25 MG suppository Place 1 suppository (25 mg total) rectally 2 (two) times daily. 05/15/21   Baruch Gouty, FNP  ?lansoprazole (PREVACID) 15 MG capsule Take 1 capsule (15 mg total) by mouth daily at 12 noon. 10/06/21   Baruch Gouty, FNP  ?Potassium Gluconate 550 MG TABS Take 1 tablet (550 mg total) by mouth at bedtime. Need to take 1 tab one day and 2 the next day alternating ?Patient taking differently: Take 550 mg by mouth at bedtime. 12/26/17   Blanchie Dessert, MD  ?psyllium (METAMUCIL) 58.6 % powder Take 1 packet by mouth  daily.    [provider]  ?sildenafil (REVATIO) 20 MG tablet Take 20 mg by mouth as needed.    [provider]  ?triamcinolone cream (KENALOG) 0.1 % Apply 1 application. topically 2 (two) times daily. 10/06/21   Baruch Gouty, FNP  ?Vitamin D, Cholecalciferol, 10 MCG (400 UNIT) CAPS Take 400 Units by mouth daily. 07/19/19   Fay Records, MD  ? ? ? ?Allergies    ?Uloric [febuxostat], Cholestyramine, Doxazosin, Doxycycline, Omeprazole, Pantoprazole, Pravastatin, Rosuvastatin, Augmentin [amoxicillin-pot clavulanate], Ciprofloxacin, Codeine, Levaquin [levofloxacin], Oxycodone, Tramadol, and Vicodin [hydrocodone-acetaminophen] ? ? ?Review of Systems   ?Review of Systems ?Please see HPI for pertinent positives and negatives ? ?Physical Exam ?BP (!) 149/80 (BP Location: Right Arm)   Pulse 60   Temp 97.9 ?F (36.6 ?C) (Oral)   Resp 16   Ht 6' (1.829 m)   Wt 84.8 kg   SpO2 98%   BMI 25.36 kg/m?  ? ?Physical Exam ?Vitals and nursing note reviewed.  ?HENT:  ?   Head: Normocephalic.  ?   Nose: Nose normal.  ?Eyes:  ?   Extraocular Movements: Extraocular movements intact.  ?Pulmonary:  ?   Effort: Pulmonary effort is normal.  ?Musculoskeletal:     ?   General: Normal range of motion.  ?   Cervical back: Neck supple.  ?Skin: ?   Findings:  Rash (small 3cm x 1cm area of erythema and induration, not erythema migrans on the R chest wall at the site of tick bite) present.  ?Neurological:  ?   Mental Status: He is alert and oriented to person, place, and time.  ?Psychiatric:     ?   Mood and Affect: Mood normal.  ? ? ?ED Results / Procedures / Treatments   ?EKG ?None ? ?Procedures ?Procedures ? ?Medications Ordered in the ED ?Medications  ?doxycycline (VIBRA-TABS) tablet 100 mg (100 mg Oral Given 10/13/21 0318)  ? ? ?Initial Impression and Plan ? Patient with rash after tick bite. No other concerning symptoms. Rx doxycycline (prior reaction of diarrhea is not a true allergy). PCP follow up.  ? ?ED Course  ? ?   ? ? ?MDM Rules/Calculators/A&P ?Medical Decision Making ?Problems Addressed: ?Tick bite of thoracic wall, unspecified whether front or back, initial encounter: acute illness or injury ? ?Risk ?Prescription drug management. ? ? ? ?Final Clinical Impression(s) / ED Diagnoses ?Final diagnoses:  ?Tick bite of thoracic wall, unspecified whether front or back, initial encounter  ? ? ?Rx / DC Orders ?ED Discharge Orders   ? ?      Ordered  ?  doxycycline (VIBRAMYCIN) 100 MG capsule  2 times daily       ? 10/13/21 0313  ? ?  ?  ? ?  ? ?  ?Truddie Hidden, MD ?10/13/21 [phone] ? ?

## 2021-10-13 NOTE — ED Triage Notes (Signed)
Pt states he got a tick bite to the right lateral chest Saturday and noticed rash around the area tonight.  ?

## 2021-10-14 NOTE — Telephone Encounter (Signed)
ED follow up scheduled ?

## 2021-10-20 DIAGNOSIS — K219 Gastro-esophageal reflux disease without esophagitis: Secondary | ICD-10-CM | POA: Diagnosis not present

## 2021-10-20 DIAGNOSIS — K22719 Barrett's esophagus with dysplasia, unspecified: Secondary | ICD-10-CM | POA: Diagnosis not present

## 2021-10-20 DIAGNOSIS — Z1211 Encounter for screening for malignant neoplasm of colon: Secondary | ICD-10-CM | POA: Diagnosis not present

## 2021-10-28 ENCOUNTER — Encounter: Payer: Self-pay | Admitting: Family Medicine

## 2021-10-28 ENCOUNTER — Ambulatory Visit (INDEPENDENT_AMBULATORY_CARE_PROVIDER_SITE_OTHER): Payer: PPO | Admitting: Family Medicine

## 2021-10-28 VITALS — BP 116/64 | HR 64 | Temp 98.4°F | Ht 72.0 in | Wt 195.0 lb

## 2021-10-28 DIAGNOSIS — W57XXXD Bitten or stung by nonvenomous insect and other nonvenomous arthropods, subsequent encounter: Secondary | ICD-10-CM

## 2021-10-28 DIAGNOSIS — S20361D Insect bite (nonvenomous) of right front wall of thorax, subsequent encounter: Secondary | ICD-10-CM

## 2021-10-28 MED ORDER — DOXYCYCLINE HYCLATE 100 MG PO TABS: 100.0000 mg | ORAL_TABLET | Freq: Two times a day (BID) | ORAL | 0 refills | Status: DC

## 2021-10-28 NOTE — Patient Instructions (Signed)
?  Cologuard  Your provider has prescribed Cologuard, an easy-to-use, noninvasive test for colon cancer screening, based on the latest advances in stool DNA science. It appears you have received the kit but have not returned it to [facility]. Support options for help with completing the screening are provided below. Please use them to assist with completing the screening as this is important.   Patient Support Screening for colon cancer is very important to your good health, so if you have any questions at all, please call [facility] [facility]'s Customer Support Specialists at [phone]. They are available 24 hours a day, 6 days a week. An instructional video is available to view online at [email or web address] (Please copy and past to web browser to view video).   
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
## 2021-10-30 LAB — ROCKY MTN SPOTTED FVR ABS PNL(IGG+IGM)
RMSF IgG: POSITIVE — AB
RMSF IgM: 0.42 index (ref 0.00–0.89)

## 2021-10-30 LAB — RMSF, IGG, IFA: RMSF, IGG, IFA: 1:64 {titer} — ABNORMAL HIGH

## 2021-10-30 LAB — LYME DISEASE SEROLOGY W/REFLEX: Lyme Total Antibody EIA: NEGATIVE

## 2021-11-02 ENCOUNTER — Telehealth: Payer: Self-pay | Admitting: Family Medicine

## 2021-11-02 DIAGNOSIS — S40861A Insect bite (nonvenomous) of right upper arm, initial encounter: Secondary | ICD-10-CM

## 2021-11-02 MED ORDER — DOXYCYCLINE HYCLATE 100 MG PO CAPS: 100.0000 mg | ORAL_CAPSULE | Freq: Two times a day (BID) | ORAL | 0 refills | Status: AC

## 2021-11-02 NOTE — Telephone Encounter (Signed)
Tell him I sent him in a prescription for the capsules and he can see if that does better for him but he needs to take the doxycycline, there is no other treatment, either the capsules or the tablets.

## 2021-11-02 NOTE — Telephone Encounter (Signed)
Patient aware.

## 2021-11-02 NOTE — Telephone Encounter (Signed)
Doxy is making him sick. Taking for RMSF  Has the tablets, has taken capsules in the past and did not make him sick, swimmy headed.  Sensitive stomach.  Nausea, no vomiting. 7 up helps. Pt is taking with food.   Zofran 4mg  causes swimmy headedness.   Meclizine causes drowsiness.    Pt is going to be alone for the next two days.  BP is good- atenolol for a fib  No fever but is itching all over, muscle and jt pain. HA comes and goes.  Eating and drinking as tolerated (nausea)

## 2021-11-07 DIAGNOSIS — N529 Male erectile dysfunction, unspecified: Secondary | ICD-10-CM | POA: Diagnosis not present

## 2021-11-10 DIAGNOSIS — Z1211 Encounter for screening for malignant neoplasm of colon: Secondary | ICD-10-CM | POA: Diagnosis not present

## 2021-11-10 DIAGNOSIS — Z1212 Encounter for screening for malignant neoplasm of rectum: Secondary | ICD-10-CM | POA: Diagnosis not present

## 2021-11-12 DIAGNOSIS — I10 Essential (primary) hypertension: Secondary | ICD-10-CM | POA: Diagnosis not present

## 2021-11-12 DIAGNOSIS — Z8669 Personal history of other diseases of the nervous system and sense organs: Secondary | ICD-10-CM | POA: Diagnosis not present

## 2021-11-13 ENCOUNTER — Ambulatory Visit (INDEPENDENT_AMBULATORY_CARE_PROVIDER_SITE_OTHER): Payer: PPO | Admitting: Family Medicine

## 2021-11-13 ENCOUNTER — Encounter: Payer: Self-pay | Admitting: Family Medicine

## 2021-11-13 VITALS — BP 149/71 | HR 68 | Temp 98.1°F | Ht 72.0 in | Wt 195.4 lb

## 2021-11-13 DIAGNOSIS — S20361D Insect bite (nonvenomous) of right front wall of thorax, subsequent encounter: Secondary | ICD-10-CM | POA: Diagnosis not present

## 2021-11-13 DIAGNOSIS — H9202 Otalgia, left ear: Secondary | ICD-10-CM | POA: Diagnosis not present

## 2021-11-13 DIAGNOSIS — M79661 Pain in right lower leg: Secondary | ICD-10-CM | POA: Diagnosis not present

## 2021-11-13 DIAGNOSIS — W57XXXD Bitten or stung by nonvenomous insect and other nonvenomous arthropods, subsequent encounter: Secondary | ICD-10-CM

## 2021-11-13 DIAGNOSIS — Z9889 Other specified postprocedural states: Secondary | ICD-10-CM | POA: Diagnosis not present

## 2021-11-13 MED ORDER — FLUOCINONIDE 0.05 % EX CREA: 1.0000 "application " | TOPICAL_CREAM | Freq: Two times a day (BID) | CUTANEOUS | 0 refills | Status: DC

## 2021-11-13 MED ORDER — CIPROFLOXACIN-DEXAMETHASONE 0.3-0.1 % OT SUSP: 4.0000 [drp] | Freq: Two times a day (BID) | OTIC | 0 refills | Status: AC

## 2021-11-13 MED ORDER — TRIAMCINOLONE ACETONIDE 0.1 % EX CREA: 1.0000 "application " | TOPICAL_CREAM | Freq: Two times a day (BID) | CUTANEOUS | 0 refills | Status: DC
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
## 2021-11-13 NOTE — Patient Instructions (Signed)
Earache, Adult ?An earache, or ear pain, can be caused by many things, including: ?An infection. ?Ear wax buildup. ?Ear pressure. ?Something in the ear that should not be there (foreign body). ?A sore throat. ?Tooth problems. ?Jaw problems. ?Treatment of the earache will depend on the cause. If the cause is not clear or cannot be determined, you may need to watch your symptoms until your earache goes away or until a cause is found. ?Follow these instructions at home: ?Medicines ?Take or apply over-the-counter and prescription medicines only as told by your health care provider. ?If you were prescribed an antibiotic medicine, use it as told by your health care provider. Do not stop using the antibiotic even if you start to feel better. ?Do not put anything in your ear other than medicine that is prescribed by your health care provider. ?Managing pain ?If directed, apply heat to the affected area as often as told by your health care provider. Use the heat source that your health care provider recommends, such as a moist heat pack or a heating pad. ?Place a towel between your skin and the heat source. ?Leave the heat on for 20-30 minutes. ?Remove the heat if your skin turns bright red. This is especially important if you are unable to feel pain, heat, or cold. You may have a greater risk of getting burned. ?If directed, put ice on the affected area as often as told by your health care provider. To do this: ? ?  ? ?Put ice in a plastic bag. ?Place a towel between your skin and the bag. ?Leave the ice on for 20 minutes, 2-3 times a day. ?General instructions ?Pay attention to any changes in your symptoms. ?Try resting in an upright position instead of lying down. This may help to reduce pressure in your ear and relieve pain. ?Chew gum if it helps to relieve your ear pain. ?Treat any allergies as told by your health care provider. ?Drink enough fluid to keep your urine pale yellow. ?It is up to you to get the results of  any tests that were done. Ask your health care provider, or the department that is doing the tests, when your results will be ready. ?Keep all follow-up visits as told by your health care provider. This is important. ?Contact a health care provider if: ?Your pain does not improve within 2 days. ?Your earache gets worse. ?You have new symptoms. ?You have a fever. ?Get help right away if you: ?Have a severe headache. ?Have a stiff neck. ?Have trouble swallowing. ?Have redness or swelling behind your ear. ?Have fluid or blood coming from your ear. ?Have hearing loss. ?Feel dizzy. ?Summary ?An earache, or ear pain, can be caused by many things. ?Treatment of the earache will depend on the cause. Follow recommendations from your health care provider to treat your ear pain. ?If the cause is not clear or cannot be determined, you may need to watch your symptoms until your earache goes away or until a cause is found. ?Keep all follow-up visits as told by your health care provider. This is important. ?This information is not intended to replace advice given to you by your health care provider. Make sure you discuss any questions you have with your health care provider. ?Document Revised: 01/05/2019 Document Reviewed: 01/06/2019 ?Elsevier Patient Education ? 2023 Elsevier Inc[employer] ? ?

## 2021-11-20 DIAGNOSIS — Z8551 Personal history of malignant neoplasm of bladder: Secondary | ICD-10-CM | POA: Diagnosis not present

## 2021-11-24 ENCOUNTER — Ambulatory Visit (INDEPENDENT_AMBULATORY_CARE_PROVIDER_SITE_OTHER): Payer: PPO | Admitting: Family Medicine

## 2021-11-24 ENCOUNTER — Encounter: Payer: Self-pay | Admitting: Family Medicine

## 2021-11-24 VITALS — BP 124/75 | HR 67 | Temp 98.8°F | Ht 72.0 in | Wt 195.0 lb

## 2021-11-24 DIAGNOSIS — Z9889 Other specified postprocedural states: Secondary | ICD-10-CM

## 2021-11-24 DIAGNOSIS — L989 Disorder of the skin and subcutaneous tissue, unspecified: Secondary | ICD-10-CM | POA: Diagnosis not present

## 2021-11-24 DIAGNOSIS — H9202 Otalgia, left ear: Secondary | ICD-10-CM | POA: Diagnosis not present
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
## 2021-12-01 DIAGNOSIS — R42 Dizziness and giddiness: Secondary | ICD-10-CM | POA: Diagnosis not present

## 2021-12-01 DIAGNOSIS — H938X3 Other specified disorders of ear, bilateral: Secondary | ICD-10-CM | POA: Diagnosis not present

## 2021-12-01 DIAGNOSIS — H9209 Otalgia, unspecified ear: Secondary | ICD-10-CM | POA: Diagnosis not present

## 2021-12-02 ENCOUNTER — Inpatient Hospital Stay (INDEPENDENT_AMBULATORY_CARE_PROVIDER_SITE_OTHER): Payer: PPO

## 2021-12-02 ENCOUNTER — Ambulatory Visit (INDEPENDENT_AMBULATORY_CARE_PROVIDER_SITE_OTHER): Payer: PPO | Admitting: Family Medicine

## 2021-12-02 ENCOUNTER — Encounter: Payer: Self-pay | Admitting: Family Medicine

## 2021-12-02 VITALS — BP 138/72 | HR 64 | Temp 97.6°F | Ht 72.0 in | Wt 196.8 lb

## 2021-12-02 DIAGNOSIS — R002 Palpitations: Secondary | ICD-10-CM

## 2021-12-02 DIAGNOSIS — R42 Dizziness and giddiness: Secondary | ICD-10-CM

## 2021-12-02 NOTE — Progress Notes (Signed)
Subjective:  Patient ID: Ronald Russell., male    DOB: 01-Jun-1935, 87 y.o.   MRN: [id number]  Patient Care Team: Baruch Gouty, FNP as PCP - General (Family Medicine) Fay Records, MD as PCP - Cardiology (Cardiology) Burtis Junes, NP (Inactive) as Nurse Practitioner (Nurse Practitioner) Ilean China, RN as Registered Nurse Ilean China, RN as Registered Nurse   Chief Complaint:  Dizziness   HPI: Ronald NACK Sr. is a 87 y.o. male presenting on 12/02/2021 for Dizziness   Pt presents today with complaints of ongoing dizziness and feeling off balance. He has been evaluated by PCP, ENT, cardiology, and neurology. He was speaking with the [facility] nurse and told them about symptoms and he was told to see his PCP. He states the dizziness with worse with certain movements and positional changes. Unable to tolerate meclizine. No focal neurological deficits. States he does feel like his heart is skipping a beat at times. No chest pain or shortness of breath, no leg swelling or syncope. Does have nausea with symptoms.  Dizziness This is a chronic problem. The current episode started more than 1 month ago. The problem occurs intermittently. The problem has been waxing and waning. Associated symptoms include nausea and vertigo. Pertinent negatives include no abdominal pain, anorexia, arthralgias, change in bowel habit, chest pain, chills, congestion, coughing, diaphoresis, fatigue, fever, headaches, joint swelling, myalgias, neck pain, numbness, rash, sore throat, swollen glands, urinary symptoms, visual change, vomiting or weakness. The symptoms are aggravated by twisting, walking and standing.     Relevant past medical, surgical, family, and social history reviewed and updated as indicated.  Allergies and medications reviewed and updated. Data reviewed: Chart in Epic.   Past Medical History:  Diagnosis Date   Allergy    Anxiety    Bladder cancer ([place]) 2010   Tx with BCG    CAD (coronary artery disease)    LHC 6/19: pLAD 75, mLAD 100, D2 75; pLCx 80, mLCx 70, EF 55-65 >> s/p CABG // Echo 3/18: EF 60-65, Gr 2 DD   Chronic bronchitis ([place])    "yearly the last 3 yrs" (02/13/2013)   Dysrhythmia    GERD (gastroesophageal reflux disease)    History of blood transfusion 1949   "seeral" (02/13/2013)   HOH (hard of hearing)    Hypertension    Ocular migraine    "not often" (02/13/2013)   Pneumonia    "last time ~ 2 yr ago; had it before that too" (02/13/2013)   Seasonal allergies    Shingles    has neuropathy since it happened in 6/17   Temporal arteritis ([place])    Thrombocytopenia ([place]) 11/29/2016    Past Surgical History:  Procedure Laterality Date   ARTERY BIOPSY Left 01/09/2013   Procedure: BIOPSY TEMPORAL ARTERY;  Surgeon: Rozetta Nunnery, MD;  Location: [facility];  Service: ENT;  Laterality: Left;   CARDIOVASCULAR STRESS TEST  07/2011   No evidence of ischemia; EF 79%   CHOLECYSTECTOMY N/A 11/30/2016   Procedure: LAPAROSCOPIC CHOLECYSTECTOMY;  Surgeon: Aviva Signs, MD;  Location: [facility];  Service: General;  Laterality: N/A;   COLONOSCOPY     CORONARY ARTERY BYPASS GRAFT N/A 12/07/2017   Procedure: CORONARY ARTERY BYPASS GRAFTING (CABG) times three using left internal mammary artery and left endoscopically harvested saphenous vein graft;  Surgeon: Melrose Nakayama, MD;  Location: [facility];  Service: Open Heart Surgery;  Laterality: N/A;   CYSTOSCOPY WITH  BIOPSY N/A 04/21/2020   Procedure: CYSTOSCOPY WITH BLADDER BIOPSY/ FULGURATION;  Surgeon: Raynelle Bring, MD;  Location: [facility];  Service: Urology;  Laterality: N/A;  ONLY NEEDS 45 MIN   LEFT HEART CATH AND CORONARY ANGIOGRAPHY N/A 12/02/2017   Procedure: LEFT HEART CATH AND CORONARY ANGIOGRAPHY;  Surgeon: Jettie Booze, MD;  Location: [facility];  Service: Cardiovascular;  Laterality: N/A;   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[place]    lower lower leg burn;  "probably 4-5 ORs in 1949 for this" (02/13/2013)   SKIN GRAFT Right    upper and lower leg   TEE WITHOUT CARDIOVERSION N/A 12/07/2017   Procedure: TRANSESOPHAGEAL ECHOCARDIOGRAM (TEE);  Surgeon: Melrose Nakayama, MD;  Location: [facility];  Service: Open Heart Surgery;  Laterality: N/A;   TONSILLECTOMY  1940's   TRANSURETHRAL RESECTION OF BLADDER TUMOR  2010 X 3   "cancer" (02/13/2013)   VASECTOMY      Social History   Socioeconomic History   Marital status: Married    Spouse name: Ann   Number of children: 4   Years of education: GED   Highest education level: GED or equivalent  Occupational History   Occupation: Retired    Comment: Security  Tobacco Use   Smoking status: Former    Packs/day: 0.75    Years: 3.00    Total pack years: 2.25    Types: Cigarettes   Smokeless tobacco: Never   Tobacco comments:    02/13/2013 "quit smoking age 67"  Vaping Use   Vaping Use: Never used  Substance and Sexual Activity   Alcohol use: No    Comment: 02/13/2013 "probably a pint of whiskey/wk up til I was probably 87 yr old"   Drug use: No   Sexual activity: Yes    Birth control/protection: None  Other Topics Concern   Not on file  Social History Narrative   Lives with wife   Caffeine use: 15-16oz soda per day, no soda   Usually drinks 60 oz water daily   Social Determinants of Health   Financial Resource Strain: Low Risk  (12/03/2020)   Overall Financial Resource Strain (CARDIA)    Difficulty of Paying Living Expenses: Not hard at all  Food Insecurity: No Food Insecurity (12/03/2020)   Hunger Vital Sign    Worried About Running Out of Food in the Last Year: Never true    Ran Out of Food in the Last Year: Never true  Transportation Needs: No Transportation Needs (12/03/2020)   PRAPARE - Transportation    Lack of Transportation (Medical): No    Lack of Transportation (Non-Medical): No  Physical Activity: Sufficiently Active (12/03/2020)   Exercise Vital Sign    Days of Exercise per  Week: 7 days    Minutes of Exercise per Session: 30 min  Stress: No Stress Concern Present (12/03/2020)   [place]    Feeling of Stress : Not at all  Social Connections: [place] (12/03/2020)   Social Connection and Isolation Panel [NHANES]    Frequency of Communication with Friends and Family: More than three times a week    Frequency of Social Gatherings with Friends and Family: More than three times a week    Attends Religious Services: More than 4 times per year    Active Member of [employer] or Organizations: Yes    Attends Archivist Meetings: More than 4 times per year  Marital Status: Married  Human resources officer Violence: Not At Risk (12/03/2020)   Humiliation, Afraid, Rape, and Kick questionnaire    Fear of Current or Ex-Partner: No    Emotionally Abused: No    Physically Abused: No    Sexually Abused: No    Outpatient Encounter Medications as of 12/02/2021  Medication Sig   acetaminophen (TYLENOL) 650 MG CR tablet Take 650 mg by mouth every 6 (six) hours as needed for pain.   acidophilus (RISAQUAD) CAPS capsule Take 1 capsule by mouth every evening.   Alirocumab (PRALUENT) 75 MG/ML SOAJ See admin instructions.   aspirin EC 81 MG tablet Take 1 tablet (81 mg total) by mouth daily.   atenolol (TENORMIN) 25 MG tablet Take 1/4 tablet by mouth daily, may take additional 1/4  tablet daily as needed for palpitations   Cyanocobalamin (VITAMIN B-12 PO) Take 1 tablet by mouth daily.   fluocinonide cream (LIDEX) 8.50 % Apply 1 application. topically 2 (two) times daily.   hydrocortisone (ANUSOL-HC) 25 MG suppository Place 1 suppository (25 mg total) rectally 2 (two) times daily.   lansoprazole (PREVACID) 15 MG capsule Take 1 capsule (15 mg total) by mouth daily at 12 noon.   Potassium Gluconate 550 MG TABS Take 1 tablet (550 mg total) by mouth at bedtime. Need to take 1 tab one day and 2 the next day  alternating (Patient taking differently: Take 550 mg by mouth at bedtime.)   psyllium (METAMUCIL) 58.6 % powder Take 1 packet by mouth daily.   sildenafil (REVATIO) 20 MG tablet Take 20 mg by mouth as needed.   triamcinolone cream (KENALOG) 0.1 % Apply 1 application. topically 2 (two) times daily.   Vitamin D, Cholecalciferol, 10 MCG (400 UNIT) CAPS Take 400 Units by mouth daily.   No facility-administered encounter medications on file as of 12/02/2021.    Allergies  Allergen Reactions   Uloric [Febuxostat] Palpitations, Other (See Comments) and Hypertension    Hypertension with palpitations and a sense of fuzziness and dizziness at the left side of his head   Cholestyramine Nausea Only   Doxazosin Diarrhea   Doxycycline Diarrhea   Omeprazole Other (See Comments)   Pantoprazole Other (See Comments)    Can tolerate 77m   Pravastatin Other (See Comments)   Rosuvastatin Other (See Comments)   Augmentin [Amoxicillin-Pot Clavulanate] Diarrhea   Ciprofloxacin Diarrhea   Codeine Nausea And Vomiting and Other (See Comments)    Severe headache   Levaquin [Levofloxacin] Diarrhea   Oxycodone Nausea And Vomiting   Tramadol Nausea And Vomiting   Vicodin [Hydrocodone-Acetaminophen] Nausea And Vomiting    Review of Systems  Constitutional:  Negative for activity change, appetite change, chills, diaphoresis, fatigue, fever and unexpected weight change.  HENT:  Positive for hearing loss and tinnitus. Negative for congestion, ear discharge, ear pain and sore throat.   Eyes: Negative.   Respiratory:  Negative for cough, chest tightness and shortness of breath.   Cardiovascular:  Positive for palpitations. Negative for chest pain and leg swelling.  Gastrointestinal:  Positive for nausea. Negative for abdominal pain, anorexia, blood in stool, change in bowel habit, constipation, diarrhea and vomiting.  Endocrine: Negative.   Genitourinary:  Negative for decreased urine volume, difficulty urinating,  dysuria, frequency and urgency.  Musculoskeletal:  Negative for arthralgias, joint swelling, myalgias and neck pain.  Skin: Negative.  Negative for rash.  Allergic/Immunologic: Negative.   Neurological:  Positive for dizziness and vertigo. Negative for tremors, seizures, syncope, facial asymmetry, speech difficulty, weakness,  light-headedness, numbness and headaches.  Hematological: Negative.   Psychiatric/Behavioral:  Negative for confusion, hallucinations, sleep disturbance and suicidal ideas.   All other systems reviewed and are negative.       Objective:  BP 138/72   Pulse 64   Temp 97.6 F (36.4 C)   Ht 6' (1.829 m)   Wt 196 lb 12.8 oz (89.3 kg)   SpO2 100%   BMI 26.69 kg/m    Wt Readings from Last 3 Encounters:  12/02/21 196 lb 12.8 oz (89.3 kg)  11/24/21 195 lb (88.5 kg)  11/13/21 195 lb 6 oz (88.6 kg)    Physical Exam Vitals and nursing note reviewed.  Constitutional:      General: He is not in acute distress.    Appearance: Normal appearance. He is not ill-appearing, toxic-appearing or diaphoretic.  HENT:     Head: Normocephalic and atraumatic.     Right Ear: Decreased hearing noted. Tenderness present. Tympanic membrane is not erythematous.     Left Ear: Decreased hearing noted. Tenderness present. Tympanic membrane is not erythematous.     Ears:     Comments: Post surgical changes to left TM, right canal small    Nose: Nose normal.     Mouth/Throat:     Mouth: Mucous membranes are moist.     Pharynx: Oropharynx is clear.  Eyes:     General: Lids are normal.     Conjunctiva/sclera: Conjunctivae normal.     Pupils: Pupils are equal, round, and reactive to light.     Slit lamp exam:    Right eye: Anterior chamber quiet.     Left eye: Anterior chamber quiet.  Cardiovascular:     Rate and Rhythm: Normal rate and regular rhythm.     Heart sounds: Normal heart sounds. No murmur heard.    No friction rub. No gallop.  Pulmonary:     Effort: Pulmonary effort  is normal.     Breath sounds: Normal breath sounds.  Musculoskeletal:     Cervical back: Normal range of motion and neck supple.     Right lower leg: No edema.     Left lower leg: No edema.  Skin:    General: Skin is warm and dry.     Capillary Refill: Capillary refill takes less than 2 seconds.  Neurological:     General: No focal deficit present.     Mental Status: He is alert and oriented to person, place, and time.     Cranial Nerves: Cranial nerves 2-12 are intact.     Sensory: Sensation is intact.     Motor: Motor function is intact.     Coordination: Coordination is intact.     Gait: Gait abnormal (unsteady).     Results for orders placed or performed in visit on 10/28/21  Rocky mtn spotted fvr abs pnl(IgG+IgM)  Result Value Ref Range   RMSF IgG Positive (A) Negative   RMSF IgM 0.42 0.00 - 0.89 index  Lyme Disease Serology w/Reflex  Result Value Ref Range   Lyme Total Antibody EIA Negative Negative  RMSF, IgG, IFA  Result Value Ref Range   RMSF, IGG, IFA 1:64 (H) Neg <1:64       Pertinent labs & imaging results that were available during my care of the patient were reviewed by me and considered in my medical decision making.  Assessment & Plan:  Pharaoh was seen today for dizziness.  Diagnoses and all orders for this visit:  Dizziness Palpitations  Ongoing and worsening. Has followed with with neuro, cardio, and ENT as discussed. Still symptomatic. Will check labs today and obtain head CT. Zio monitor placed to evaluate for arrhythmia. Further treatment pending results. May need new referral to ENT / Neuro.  -     CT HEAD WO CONTRAST (5MM); Future -     CBC with Differential/Platelet -     BMP8+EGFR -     LONG TERM MONITOR (3-14 DAYS); Future -     Thyroid Panel With TSH     Continue all other maintenance medications.  Follow up plan: Return if symptoms worsen or fail to improve.   Continue healthy lifestyle choices, including diet (rich in fruits,  vegetables, and lean proteins, and low in salt and simple carbohydrates) and exercise (at least 30 minutes of moderate physical activity daily).  Educational handout given for dizziness  The above assessment and management plan was discussed with the patient. The patient verbalized understanding of and has agreed to the management plan. Patient is aware to call the clinic if they develop any new symptoms or if symptoms persist or worsen. Patient is aware when to return to the clinic for a follow-up visit. Patient educated on when it is appropriate to go to the emergency department.   Monia Pouch, FNP-C [facility] [phone]

## 2021-12-03 LAB — CBC WITH DIFFERENTIAL/PLATELET
Basophils Absolute: 0.1 10*3/uL (ref 0.0–0.2)
Basos: 1 %
EOS (ABSOLUTE): 0.2 10*3/uL (ref 0.0–0.4)
Eos: 3 %
Hematocrit: 46.5 % (ref 37.5–51.0)
Hemoglobin: 15.9 g/dL (ref 13.0–17.7)
Immature Grans (Abs): 0 10*3/uL (ref 0.0–0.1)
Immature Granulocytes: 1 %
Lymphocytes Absolute: 1.2 10*3/uL (ref 0.7–3.1)
Lymphs: 21 %
MCH: 31.4 pg (ref 26.6–33.0)
MCHC: 34.2 g/dL (ref 31.5–35.7)
MCV: 92 fL (ref 79–97)
Monocytes Absolute: 0.6 10*3/uL (ref 0.1–0.9)
Monocytes: 11 %
Neutrophils Absolute: 3.7 10*3/uL (ref 1.4–7.0)
Neutrophils: 63 %
Platelets: 127 10*3/uL — ABNORMAL LOW (ref 150–450)
RBC: 5.07 x10E6/uL (ref 4.14–5.80)
RDW: 13.5 % (ref 11.6–15.4)
WBC: 5.8 10*3/uL (ref 3.4–10.8)

## 2021-12-03 LAB — BMP8+EGFR
BUN/Creatinine Ratio: 12 (ref 10–24)
BUN: 16 mg/dL (ref 8–27)
CO2: 22 mmol/L (ref 20–29)
Calcium: 9.2 mg/dL (ref 8.6–10.2)
Chloride: 104 mmol/L (ref 96–106)
Creatinine, Ser: 1.3 mg/dL — ABNORMAL HIGH (ref 0.76–1.27)
Glucose: 98 mg/dL (ref 70–99)
Potassium: 4.1 mmol/L (ref 3.5–5.2)
Sodium: 141 mmol/L (ref 134–144)
eGFR: 54 mL/min/{1.73_m2} — ABNORMAL LOW (ref >59)

## 2021-12-03 LAB — THYROID PANEL WITH TSH
Free Thyroxine Index: 2.4 (ref 1.2–4.9)
T3 Uptake Ratio: 29 % (ref 24–39)
T4, Total: 8.4 ug/dL (ref 4.5–12.0)
TSH: 4.24 u[IU]/mL (ref 0.450–4.500)

## 2021-12-04 ENCOUNTER — Telehealth: Payer: Self-pay

## 2021-12-04 ENCOUNTER — Ambulatory Visit (INDEPENDENT_AMBULATORY_CARE_PROVIDER_SITE_OTHER): Payer: PPO

## 2021-12-04 VITALS — Wt 196.0 lb

## 2021-12-04 DIAGNOSIS — Z0000 Encounter for general adult medical examination without abnormal findings: Secondary | ICD-10-CM | POA: Diagnosis not present

## 2021-12-04 NOTE — Telephone Encounter (Signed)
Patient notified and verbalized understanding. Also states that the heart monitor that was placed is so irritating and he can't sleep because of it. He really doesn't feel like he needs it. Spoke with Kari Baars and she advised that it was ok for him to remove the heart monitor and mail back to the company. Patient states that he would

## 2021-12-08 DIAGNOSIS — L309 Dermatitis, unspecified: Secondary | ICD-10-CM | POA: Diagnosis not present

## 2021-12-08 DIAGNOSIS — D1801 Hemangioma of skin and subcutaneous tissue: Secondary | ICD-10-CM | POA: Diagnosis not present

## 2021-12-08 DIAGNOSIS — L821 Other seborrheic keratosis: Secondary | ICD-10-CM | POA: Diagnosis not present

## 2021-12-08 DIAGNOSIS — L570 Actinic keratosis: Secondary | ICD-10-CM | POA: Diagnosis not present

## 2021-12-10 ENCOUNTER — Ambulatory Visit (HOSPITAL_COMMUNITY)
Admission: RE | Admit: 2021-12-10 | Discharge: 2021-12-10 | Disposition: A | Discharge status: Home / Self Care | Payer: PPO | Source: Ambulatory Visit | Attending: Family Medicine | Admitting: Family Medicine

## 2021-12-10 DIAGNOSIS — R42 Dizziness and giddiness: Secondary | ICD-10-CM | POA: Diagnosis not present

## 2021-12-18 DIAGNOSIS — K219 Gastro-esophageal reflux disease without esophagitis: Secondary | ICD-10-CM | POA: Diagnosis not present

## 2021-12-18 DIAGNOSIS — H938X3 Other specified disorders of ear, bilateral: Secondary | ICD-10-CM | POA: Diagnosis not present

## 2021-12-18 DIAGNOSIS — R42 Dizziness and giddiness: Secondary | ICD-10-CM | POA: Diagnosis not present

## 2021-12-22 ENCOUNTER — Other Ambulatory Visit: Payer: Self-pay | Admitting: Family Medicine

## 2021-12-22 ENCOUNTER — Ambulatory Visit: Payer: Self-pay | Admitting: *Deleted

## 2021-12-22 DIAGNOSIS — I25118 Atherosclerotic heart disease of native coronary artery with other forms of angina pectoris: Secondary | ICD-10-CM

## 2021-12-22 DIAGNOSIS — Z951 Presence of aortocoronary bypass graft: Secondary | ICD-10-CM

## 2021-12-22 DIAGNOSIS — D696 Thrombocytopenia, unspecified: Secondary | ICD-10-CM

## 2021-12-22 DIAGNOSIS — K219 Gastro-esophageal reflux disease without esophagitis: Secondary | ICD-10-CM

## 2021-12-22 DIAGNOSIS — I10 Essential (primary) hypertension: Secondary | ICD-10-CM

## 2021-12-22 DIAGNOSIS — E559 Vitamin D deficiency, unspecified: Secondary | ICD-10-CM

## 2021-12-22 NOTE — Chronic Care Management (AMB) (Signed)
  Chronic Care Management   Note  12/22/2021 Name: Ronald GARMAN Sr. MRN: [id number] DOB: Dec 23, 1934   Patient has not recently engaged with the Chronic Care Management RN Care Manager. Removing RN Care Manager from Care Team and closing [facility]. If patient is currently engaged with another CCM team member I will forward this encounter to inform them of my case closure. Patient may be eligible for re-engagement with RN Care Manager in the future if necessary and can discuss this with their PCP.  Chong Sicilian, BSN, RN-BC Embedded Chronic Care Manager [facility] [place] [facility] Direct Dial: [phone]

## 2021-12-25 ENCOUNTER — Telehealth: Payer: Self-pay

## 2021-12-25 DIAGNOSIS — R42 Dizziness and giddiness: Secondary | ICD-10-CM | POA: Diagnosis not present

## 2021-12-25 DIAGNOSIS — R002 Palpitations: Secondary | ICD-10-CM | POA: Diagnosis not present

## 2021-12-25 NOTE — Chronic Care Management (AMB) (Signed)
  Chronic Care Management   Note  12/25/2021 Name: Ronald EMIG Sr. MRN: [id number] DOB: Jul 12, 1934  [name] is a 87 y.o. year old male who is a primary care patient of Rakes, Connye Burkitt, FNP. I reached out to [place] by phone today in response to a referral sent by Mr. KAREEM CATHEY Sr.'s PCP.  Mr. Baumgardner was given information about Chronic Care Management services today including:  CCM service includes personalized support from designated clinical staff supervised by his physician, including individualized plan of care and coordination with other care providers 24/7 contact phone numbers for assistance for urgent and routine care needs. Service will only be billed when office clinical staff spend 20 minutes or more in a month to coordinate care. Only one practitioner may furnish and bill the service in a calendar month. The patient may stop CCM services at any time (effective at the end of the month) by phone call to the office staff. The patient is responsible for co-pay (up to 20% after annual deductible is met) if co-pay is required by the individual health plan.   Patient agreed to services and verbal consent obtained.   Follow up plan: Telephone appointment with care management team member scheduled for:01/20/2022  Noreene Larsson, [place], [place] [address] Direct Dial: [phone] [email or web address]

## 2021-12-28 ENCOUNTER — Telehealth: Payer: Self-pay | Admitting: Internal Medicine

## 2021-12-28 DIAGNOSIS — H938X2 Other specified disorders of left ear: Secondary | ICD-10-CM | POA: Diagnosis not present

## 2021-12-28 NOTE — Telephone Encounter (Signed)
Pt called to report that he was on Doxycyline for 3 weeks for [facility] Spotted fever from a tick bite... he says he has had a lot of problems since taking it... he has been very "swimmy headed"... his PCP ordered a Monitor on him and the results showed a lowest HR of 49... but appears to be at 1 am when the pt was sleeping... his PCP asked to have Dr Harrington Challenger review (in [place])... he had stopped his supplements that he had been taking Vit D, OTC potassium (gluconate), and Vit B12..... he then restarted the Potassium Gluconate and  the dizziness came back... he has also has had a severe headache..   Pt says he is taking the potassium for a low K he had years ago when he had bladder CA.   His recent BP checked today 138/71 and HR 59... he says prior to the tick bite he was feeling very well and not having any problems with his BP or HR (which he says tends to run low).   I will forward to Dr Harrington Challenger to review the monitor.

## 2021-12-28 NOTE — Telephone Encounter (Signed)
Pt got an echo from PCP and they told him to f/u with cardiologist. Pt states that bradycardia is normal for him and is not sure if he should actually make an appt or not. Please advise.

## 2021-12-29 NOTE — Telephone Encounter (Signed)
I have reviewed monitor  No signficant arrhythmias.   No significant bradycardia    I do not see anything that would explain "swimmy headedness"

## 2021-12-29 NOTE — Telephone Encounter (Signed)
Pt advised and will keep his appt 02/2022... he will call back if anything changes.

## 2021-12-31 ENCOUNTER — Ambulatory Visit (INDEPENDENT_AMBULATORY_CARE_PROVIDER_SITE_OTHER): Payer: PPO | Admitting: Family Medicine

## 2021-12-31 ENCOUNTER — Encounter: Payer: Self-pay | Admitting: Family Medicine

## 2021-12-31 VITALS — BP 139/80 | HR 62 | Temp 98.8°F | Ht 72.0 in | Wt 194.0 lb

## 2021-12-31 DIAGNOSIS — E538 Deficiency of other specified B group vitamins: Secondary | ICD-10-CM | POA: Diagnosis not present

## 2021-12-31 DIAGNOSIS — E559 Vitamin D deficiency, unspecified: Secondary | ICD-10-CM | POA: Diagnosis not present

## 2021-12-31 DIAGNOSIS — N289 Disorder of kidney and ureter, unspecified: Secondary | ICD-10-CM | POA: Diagnosis not present

## 2021-12-31 DIAGNOSIS — R42 Dizziness and giddiness: Secondary | ICD-10-CM

## 2021-12-31 DIAGNOSIS — D696 Thrombocytopenia, unspecified: Secondary | ICD-10-CM

## 2021-12-31 NOTE — Progress Notes (Signed)
Subjective:  Patient ID: Ronald Morrow., male    DOB: Oct 02, 1934, 87 y.o.   MRN: [id number]  Patient Care Team: Baruch Gouty, FNP as PCP - General (Family Medicine) Fay Records, MD as PCP - Cardiology (Cardiology) Burtis Junes, NP (Inactive) as Nurse Practitioner (Nurse Practitioner) Lavera Guise, [facility] as [place] Management (Pharmacist)   Chief Complaint:  Ear Fullness   HPI: Ronald COLCLOUGH Sr. is a 87 y.o. male presenting on 12/31/2021 for Ear Fullness   Pt presents today with ongoing complaints of dizziness, swimmy headedness, ear fullness, and malaise. He has been referred to ENT several times and is awaiting an appointment. No new or worsening symptoms, just persistent. Neuro workup has been unremarkable. Cardiac event monitor was only worn for 2 days. Pt has not followed up with cardiology. He has self adjusted his medications according to what he feels is making him dizzy. He has stopped taking his potassium and Vit B12 supplements. He is concerned this could be contributory.      Relevant past medical, surgical, family, and social history reviewed and updated as indicated.  Allergies and medications reviewed and updated. Data reviewed: Chart in Epic.   Past Medical History:  Diagnosis Date   Allergy    Anxiety    Bladder cancer ([place]) 2010   Tx with BCG   CAD (coronary artery disease)    LHC 6/19: pLAD 75, mLAD 100, D2 75; pLCx 80, mLCx 70, EF 55-65 >> s/p CABG // Echo 3/18: EF 60-65, Gr 2 DD   Chronic bronchitis ([place])    "yearly the last 3 yrs" (02/13/2013)   Dysrhythmia    GERD (gastroesophageal reflux disease)    History of blood transfusion 1949   "seeral" (02/13/2013)   HOH (hard of hearing)    Hypertension    Ocular migraine    "not often" (02/13/2013)   Pneumonia    "last time ~ 2 yr ago; had it before that too" (02/13/2013)   Seasonal allergies    Shingles    has neuropathy since it happened in 6/17   Temporal arteritis  ([place])    Thrombocytopenia ([place]) 11/29/2016    Past Surgical History:  Procedure Laterality Date   ARTERY BIOPSY Left 01/09/2013   Procedure: BIOPSY TEMPORAL ARTERY;  Surgeon: Rozetta Nunnery, MD;  Location: [place];  Service: ENT;  Laterality: Left;   CARDIOVASCULAR STRESS TEST  07/2011   No evidence of ischemia; EF 79%   CHOLECYSTECTOMY N/A 11/30/2016   Procedure: LAPAROSCOPIC CHOLECYSTECTOMY;  Surgeon: Aviva Signs, MD;  Location: [facility];  Service: General;  Laterality: N/A;   COLONOSCOPY     CORONARY ARTERY BYPASS GRAFT N/A 12/07/2017   Procedure: CORONARY ARTERY BYPASS GRAFTING (CABG) times three using left internal mammary artery and left endoscopically harvested saphenous vein graft;  Surgeon: Melrose Nakayama, MD;  Location: [place];  Service: Open Heart Surgery;  Laterality: N/A;   CYSTOSCOPY WITH BIOPSY N/A 04/21/2020   Procedure: CYSTOSCOPY WITH BLADDER BIOPSY/ FULGURATION;  Surgeon: Raynelle Bring, MD;  Location: [facility];  Service: Urology;  Laterality: N/A;  ONLY NEEDS 45 MIN   LEFT HEART CATH AND CORONARY ANGIOGRAPHY N/A 12/02/2017   Procedure: LEFT HEART CATH AND CORONARY ANGIOGRAPHY;  Surgeon: Jettie Booze, MD;  Location: [place] [facility];  Service: Cardiovascular;  Laterality: N/A;   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[place]    lower lower  leg burn; "probably 4-5 ORs in 1949 for this" (02/13/2013)   SKIN GRAFT Right    upper and lower leg   TEE WITHOUT CARDIOVERSION N/A 12/07/2017   Procedure: TRANSESOPHAGEAL ECHOCARDIOGRAM (TEE);  Surgeon: Melrose Nakayama, MD;  Location: [place];  Service: Open Heart Surgery;  Laterality: N/A;   TONSILLECTOMY  1940's   TRANSURETHRAL RESECTION OF BLADDER TUMOR  2010 X 3   "cancer" (02/13/2013)   VASECTOMY      Social History   Socioeconomic History   Marital status: Married    Spouse name: Ronald Russell   Number of children: 4   Years of education: GED   Highest education level: GED or equivalent   Occupational History   Occupation: Retired    Comment: Security  Tobacco Use   Smoking status: Former    Packs/day: 0.75    Years: 3.00    Total pack years: 2.25    Types: Cigarettes   Smokeless tobacco: Never   Tobacco comments:    02/13/2013 "quit smoking age 29"  Vaping Use   Vaping Use: Never used  Substance and Sexual Activity   Alcohol use: No    Comment: 02/13/2013 "probably a pint of whiskey/wk up til I was probably 87 yr old"   Drug use: No   Sexual activity: Yes    Birth control/protection: None  Other Topics Concern   Not on file  Social History Narrative   Lives with wife   Caffeine use: 15-16oz soda per day, no soda   Usually drinks 60 oz water daily   Social Determinants of Health   Financial Resource Strain: Low Risk  (12/04/2021)   Overall Financial Resource Strain (CARDIA)    Difficulty of Paying Living Expenses: Not hard at all  Food Insecurity: No Food Insecurity (12/04/2021)   Hunger Vital Sign    Worried About Running Out of Food in the Last Year: Never true    [place] in the Last Year: Never true  Transportation Needs: No Transportation Needs (12/04/2021)   PRAPARE - Hydrologist (Medical): No    Lack of Transportation (Non-Medical): No  Physical Activity: Insufficiently Active (12/04/2021)   Exercise Vital Sign    Days of Exercise per Week: 5 days    Minutes of Exercise per Session: 20 min  Stress: No Stress Concern Present (12/04/2021)   [place]    Feeling of Stress : Only a little  Social Connections: Socially Integrated (12/04/2021)   Social Connection and Isolation Panel [NHANES]    Frequency of Communication with Friends and Family: More than three times a week    Frequency of Social Gatherings with Friends and Family: More than three times a week    Attends Religious Services: More than 4 times per year    Active Member of [employer] or  Organizations: Yes    Attends Music therapist: More than 4 times per year    Marital Status: Married  Human resources officer Violence: Not At Risk (12/04/2021)   Humiliation, Afraid, Rape, and Kick questionnaire    Fear of Current or Ex-Partner: No    Emotionally Abused: No    Physically Abused: No    Sexually Abused: No    Outpatient Encounter Medications as of 12/31/2021  Medication Sig   acetaminophen (TYLENOL) 650 MG CR tablet Take 650 mg by mouth every 6 (six) hours as needed for pain.   acidophilus (RISAQUAD) CAPS capsule  Take 1 capsule by mouth every evening.   Alirocumab (PRALUENT) 75 MG/ML SOAJ See admin instructions.   aspirin EC 81 MG tablet Take 1 tablet (81 mg total) by mouth daily.   atenolol (TENORMIN) 25 MG tablet Take 1/4 tablet by mouth daily, may take additional 1/4  tablet daily as needed for palpitations   cyanocobalamin (CYANOCOBALAMIN) 500 MCG tablet Take 2 tablets by mouth daily.   Cyanocobalamin (VITAMIN B-12 PO) Take 1 tablet by mouth daily.   fluocinonide cream (LIDEX) 4.13 % Apply 1 application. topically 2 (two) times daily.   hydrocortisone (ANUSOL-HC) 25 MG suppository Place 1 suppository (25 mg total) rectally 2 (two) times daily.   lansoprazole (PREVACID) 15 MG capsule Take 1 capsule (15 mg total) by mouth daily at 12 noon.   ofloxacin (FLOXIN) 0.3 % OTIC solution SMARTSIG:In Ear(s)   Potassium Gluconate 550 MG TABS Take 1 tablet (550 mg total) by mouth at bedtime. Need to take 1 tab one day and 2 the next day alternating (Patient taking differently: Take 550 mg by mouth at bedtime.)   psyllium (METAMUCIL) 58.6 % powder Take 1 packet by mouth daily.   sildenafil (REVATIO) 20 MG tablet Take 20 mg by mouth as needed.   triamcinolone cream (KENALOG) 0.1 % Apply 1 application. topically 2 (two) times daily.   Vitamin D, Cholecalciferol, 10 MCG (400 UNIT) CAPS Take 400 Units by mouth daily.   No facility-administered encounter medications on file as of  12/31/2021.    Allergies  Allergen Reactions   Uloric [Febuxostat] Palpitations, Other (See Comments) and Hypertension    Hypertension with palpitations and a sense of fuzziness and dizziness at the left side of his head   Cholestyramine Nausea Only   Doxazosin Diarrhea   Doxycycline Diarrhea   Omeprazole Other (See Comments)   Pantoprazole Other (See Comments)    Can tolerate $RemoveBefo'20mg'MVsKDcMDOxy$    Pravastatin Other (See Comments)   Rosuvastatin Other (See Comments)   Augmentin [Amoxicillin-Pot Clavulanate] Diarrhea   Ciprofloxacin Diarrhea   Codeine Nausea And Vomiting and Other (See Comments)    Severe headache   Levaquin [Levofloxacin] Diarrhea   Oxycodone Nausea And Vomiting   Tramadol Nausea And Vomiting   Vicodin [Hydrocodone-Acetaminophen] Nausea And Vomiting    Review of Systems  Constitutional:  Positive for fatigue. Negative for activity change, appetite change, chills, diaphoresis, fever and unexpected weight change.  HENT:         Ear fullness  Eyes:  Negative for photophobia and visual disturbance.  Respiratory:  Negative for cough and shortness of breath.   Cardiovascular:  Negative for chest pain, palpitations and leg swelling.  Gastrointestinal:  Negative for abdominal pain, anal bleeding, blood in stool, diarrhea, nausea and vomiting.  Genitourinary:  Negative for decreased urine volume, difficulty urinating and hematuria.  Neurological:  Positive for dizziness and light-headedness. Negative for tremors, seizures, syncope, facial asymmetry, speech difficulty, weakness, numbness and headaches.  Psychiatric/Behavioral:  Negative for confusion.   All other systems reviewed and are negative.       Objective:  BP 139/80   Pulse 62   Temp 98.8 F (37.1 C)   Ht 6' (1.829 m)   Wt 194 lb (88 kg)   SpO2 95%   BMI 26.31 kg/m    Wt Readings from Last 3 Encounters:  12/31/21 194 lb (88 kg)  12/04/21 196 lb (88.9 kg)  12/02/21 196 lb 12.8 oz (89.3 kg)    Physical  Exam Vitals and nursing note reviewed.  Constitutional:  General: He is not in acute distress.    Appearance: Normal appearance. He is normal weight. He is not ill-appearing, toxic-appearing or diaphoretic.  HENT:     Head: Normocephalic and atraumatic.     Right Ear: External ear normal. Decreased hearing noted. Tympanic membrane is not erythematous.     Left Ear: Ear canal and external ear normal. Decreased hearing noted. Tympanic membrane is injected. Tympanic membrane is not erythematous.     Ears:     Comments: Right canal small, postsurgical changes to left TM Eyes:     Conjunctiva/sclera: Conjunctivae normal.     Pupils: Pupils are equal, round, and reactive to light.  Cardiovascular:     Rate and Rhythm: Normal rate and regular rhythm.     Heart sounds: Normal heart sounds. No murmur heard.    No friction rub. No gallop.  Pulmonary:     Effort: Pulmonary effort is normal.     Breath sounds: Normal breath sounds.  Musculoskeletal:     Right lower leg: No edema.     Left lower leg: No edema.  Skin:    General: Skin is warm and dry.     Capillary Refill: Capillary refill takes less than 2 seconds.     Coloration: Skin is not pale.  Neurological:     General: No focal deficit present.     Mental Status: He is alert and oriented to person, place, and time.     Gait: Gait abnormal (unsteady).  Psychiatric:        Mood and Affect: Mood normal.        Behavior: Behavior normal.        Thought Content: Thought content normal.        Judgment: Judgment normal.     Results for orders placed or performed in visit on 12/02/21  CBC with Differential/Platelet  Result Value Ref Range   WBC 5.8 3.4 - 10.8 x10E3/uL   RBC 5.07 4.14 - 5.80 x10E6/uL   Hemoglobin 15.9 13.0 - 17.7 g/dL   Hematocrit 46.5 37.5 - 51.0 %   MCV 92 79 - 97 fL   MCH 31.4 26.6 - 33.0 pg   MCHC 34.2 31.5 - 35.7 g/dL   RDW 13.5 11.6 - 15.4 %   Platelets 127 (L) 150 - 450 x10E3/uL   Neutrophils 63 Not  Estab. %   Lymphs 21 Not Estab. %   Monocytes 11 Not Estab. %   Eos 3 Not Estab. %   Basos 1 Not Estab. %   Neutrophils Absolute 3.7 1.4 - 7.0 x10E3/uL   Lymphocytes Absolute 1.2 0.7 - 3.1 x10E3/uL   Monocytes Absolute 0.6 0.1 - 0.9 x10E3/uL   EOS (ABSOLUTE) 0.2 0.0 - 0.4 x10E3/uL   Basophils Absolute 0.1 0.0 - 0.2 x10E3/uL   Immature Granulocytes 1 Not Estab. %   Immature Grans (Abs) 0.0 0.0 - 0.1 x10E3/uL  BMP8+EGFR  Result Value Ref Range   Glucose 98 70 - 99 mg/dL   BUN 16 8 - 27 mg/dL   Creatinine, Ser 1.30 (H) 0.76 - 1.27 mg/dL   eGFR 54 (L) >59 mL/min/1.73   BUN/Creatinine Ratio 12 10 - 24   Sodium 141 134 - 144 mmol/L   Potassium 4.1 3.5 - 5.2 mmol/L   Chloride 104 96 - 106 mmol/L   CO2 22 20 - 29 mmol/L   Calcium 9.2 8.6 - 10.2 mg/dL  Thyroid Panel With TSH  Result Value Ref Range   TSH 4.240 0.450 -  4.500 uIU/mL   T4, Total 8.4 4.5 - 12.0 ug/dL   T3 Uptake Ratio 29 24 - 39 %   Free Thyroxine Index 2.4 1.2 - 4.9       Pertinent labs & imaging results that were available during my care of the patient were reviewed by me and considered in my medical decision making.  Assessment & Plan:  Tyrique was seen today for ear fullness.  Diagnoses and all orders for this visit:  Dizziness Ongoing. Has not had appointment with ENT, has been referred. Will check labs today as pt feels this could be contributing to his dizziness.  -     CBC with Differential/Platelet -     CMP14+EGFR -     Magnesium -     Vitamin B12  Vitamin D deficiency Labs pending. Continue repletion therapy. If indicated, will change repletion dosage. Eat foods rich in Vit D including milk, orange juice, yogurt with vitamin D added, salmon or mackerel, canned tuna fish, cereals with vitamin D added, and cod liver oil. Get out in the sun but make sure to wear at least SPF 30 sunscreen.  -     VITAMIN D 25 Hydroxy (Vit-D Deficiency, Fractures)  Thrombocytopenia (HCC) Will check labs today.  -     CBC  with Differential/Platelet  Renal insufficiency No changes in urine output. Will check labs today.  -     CBC with Differential/Platelet -     CMP14+EGFR -     VITAMIN D 25 Hydroxy (Vit-D Deficiency, Fractures) -     Magnesium -     Vitamin B12  Vitamin B12 deficiency Will check labs today and replete if necessary.  -     Vitamin B12     Continue all other maintenance medications.  Follow up plan: Return if symptoms worsen or fail to improve.   Continue healthy lifestyle choices, including diet (rich in fruits, vegetables, and lean proteins, and low in salt and simple carbohydrates) and exercise (at least 30 minutes of moderate physical activity daily).    The above assessment and management plan was discussed with the patient. The patient verbalized understanding of and has agreed to the management plan. Patient is aware to call the clinic if they develop any new symptoms or if symptoms persist or worsen. Patient is aware when to return to the clinic for a follow-up visit. Patient educated on when it is appropriate to go to the emergency department.   Monia Pouch, FNP-C [facility] [phone]

## 2022-01-01 LAB — CBC WITH DIFFERENTIAL/PLATELET
Basophils Absolute: 0.1 10*3/uL (ref 0.0–0.2)
Basos: 1 %
EOS (ABSOLUTE): 0.1 10*3/uL (ref 0.0–0.4)
Eos: 2 %
Hematocrit: 47.7 % (ref 37.5–51.0)
Hemoglobin: 16.3 g/dL (ref 13.0–17.7)
Immature Grans (Abs): 0 10*3/uL (ref 0.0–0.1)
Immature Granulocytes: 0 %
Lymphocytes Absolute: 1.3 10*3/uL (ref 0.7–3.1)
Lymphs: 21 %
MCH: 31.5 pg (ref 26.6–33.0)
MCHC: 34.2 g/dL (ref 31.5–35.7)
MCV: 92 fL (ref 79–97)
Monocytes Absolute: 0.7 10*3/uL (ref 0.1–0.9)
Monocytes: 11 %
Neutrophils Absolute: 4.3 10*3/uL (ref 1.4–7.0)
Neutrophils: 65 %
Platelets: 133 10*3/uL — ABNORMAL LOW (ref 150–450)
RBC: 5.17 x10E6/uL (ref 4.14–5.80)
RDW: 13.8 % (ref 11.6–15.4)
WBC: 6.5 10*3/uL (ref 3.4–10.8)

## 2022-01-01 LAB — CMP14+EGFR
ALT: 13 IU/L (ref 0–44)
AST: 15 IU/L (ref 0–40)
Albumin/Globulin Ratio: 1.9 (ref 1.2–2.2)
Albumin: 4.4 g/dL (ref 3.7–4.7)
Alkaline Phosphatase: 59 IU/L (ref 44–121)
BUN/Creatinine Ratio: 13 (ref 10–24)
BUN: 16 mg/dL (ref 8–27)
Bilirubin Total: 0.6 mg/dL (ref 0.0–1.2)
CO2: 20 mmol/L (ref 20–29)
Calcium: 8.7 mg/dL (ref 8.6–10.2)
Chloride: 103 mmol/L (ref 96–106)
Creatinine, Ser: 1.27 mg/dL (ref 0.76–1.27)
Globulin, Total: 2.3 g/dL (ref 1.5–4.5)
Glucose: 108 mg/dL — ABNORMAL HIGH (ref 70–99)
Potassium: 4.1 mmol/L (ref 3.5–5.2)
Sodium: 137 mmol/L (ref 134–144)
Total Protein: 6.7 g/dL (ref 6.0–8.5)
eGFR: 55 mL/min/{1.73_m2} — ABNORMAL LOW (ref >59)

## 2022-01-01 LAB — VITAMIN D 25 HYDROXY (VIT D DEFICIENCY, FRACTURES): Vit D, 25-Hydroxy: 43 ng/mL (ref 30.0–100.0)

## 2022-01-01 LAB — VITAMIN B12: Vitamin B-12: 509 pg/mL (ref 232–1245)

## 2022-01-01 LAB — MAGNESIUM: Magnesium: 2 mg/dL (ref 1.6–2.3)

## 2022-01-05 DIAGNOSIS — Z974 Presence of external hearing-aid: Secondary | ICD-10-CM | POA: Diagnosis not present

## 2022-01-05 DIAGNOSIS — H9202 Otalgia, left ear: Secondary | ICD-10-CM | POA: Diagnosis not present

## 2022-01-05 DIAGNOSIS — Z951 Presence of aortocoronary bypass graft: Secondary | ICD-10-CM | POA: Diagnosis not present

## 2022-01-05 DIAGNOSIS — H61301 Acquired stenosis of right external ear canal, unspecified: Secondary | ICD-10-CM | POA: Diagnosis not present

## 2022-01-05 DIAGNOSIS — H6121 Impacted cerumen, right ear: Secondary | ICD-10-CM | POA: Diagnosis not present

## 2022-01-05 DIAGNOSIS — R42 Dizziness and giddiness: Secondary | ICD-10-CM | POA: Diagnosis not present

## 2022-01-05 DIAGNOSIS — Z9889 Other specified postprocedural states: Secondary | ICD-10-CM | POA: Diagnosis not present

## 2022-01-05 DIAGNOSIS — H906 Mixed conductive and sensorineural hearing loss, bilateral: Secondary | ICD-10-CM | POA: Diagnosis not present

## 2022-01-05 DIAGNOSIS — Z09 Encounter for follow-up examination after completed treatment for conditions other than malignant neoplasm: Secondary | ICD-10-CM | POA: Diagnosis not present

## 2022-01-05 DIAGNOSIS — H903 Sensorineural hearing loss, bilateral: Secondary | ICD-10-CM | POA: Diagnosis not present

## 2022-01-07 DIAGNOSIS — R42 Dizziness and giddiness: Secondary | ICD-10-CM | POA: Diagnosis not present

## 2022-01-07 DIAGNOSIS — H9319 Tinnitus, unspecified ear: Secondary | ICD-10-CM | POA: Diagnosis not present

## 2022-01-09 DIAGNOSIS — H40033 Anatomical narrow angle, bilateral: Secondary | ICD-10-CM | POA: Diagnosis not present

## 2022-01-09 DIAGNOSIS — H2513 Age-related nuclear cataract, bilateral: Secondary | ICD-10-CM | POA: Diagnosis not present

## 2022-01-11 ENCOUNTER — Telehealth: Payer: Self-pay | Admitting: Internal Medicine

## 2022-01-11 NOTE — Telephone Encounter (Signed)
Pt c/o medication issue:  1. Name of Medication: Prednisone 5mg   2. How are you currently taking this medication (dosage and times per day)?  Have not started taking it yet, wants Dr Harrington Challenger to approve it  3. Are you having a reaction (difficulty breathing--STAT)?   4. What is your medication issue?  Wants to know if he can take it, because it might caught his heart to skip. He wants to know if his heart skip would it be life threatening-  the doctor wants him to take the Prednisone for swelling from a tick bite

## 2022-01-11 NOTE — Telephone Encounter (Signed)
-  Patient stated he go bit by a tick and was started on Doxycycline for [facility] mountain spotted fever. Stated he took the capsules in the hospital but when he went to his PCP and the blood test was positive they gave him more Doxycycline but tablets this time. He stated the tablets made him have nausea and was swimmy headed.  -He then saw the doctor for ear pain and got some ear drops and a referral to ENT.  -Then saw PCP again for Dizziness and they did a CT of head and a heart monitor. (Dr. Harrington Challenger reviewed and was okay in a previous phone note) - Went to ENT at [facility] and got ear drops again and Amoxicillin.   The patient stated he was going to start the Amoxicillin today and would hold off of the prednisone for now.  Will forward to MD.

## 2022-01-11 NOTE — Telephone Encounter (Signed)
Yes prednisone can increase his heart rate. If he is concerned and his swelling is not severe, he does not need to take it

## 2022-01-12 NOTE — Telephone Encounter (Signed)
I spoke with the pt and he will let us know if he has to take the prednisone but as of now he is only taking the Amoxicillin.

## 2022-01-13 NOTE — Telephone Encounter (Signed)
Patient states he has now been getting swimmy headed. He would like to know if he can take dramamine or meclozine.

## 2022-01-13 NOTE — Telephone Encounter (Signed)
Dramamine or meclizine should be fine.

## 2022-01-13 NOTE — Telephone Encounter (Signed)
Pt advised and will let us know if he has any further questions.

## 2022-01-13 NOTE — Telephone Encounter (Signed)
Pt is asking if he can use dramamine and or meclizine with his current med list.... he was told to call by his PCP to be sure no med interactions or contraindication with his heart history.   I advised him that I will froward to our Pharm D for review.

## 2022-01-15 ENCOUNTER — Telehealth: Payer: Self-pay | Admitting: Family Medicine

## 2022-01-20 ENCOUNTER — Ambulatory Visit: Payer: PPO | Admitting: Pharmacist

## 2022-01-20 DIAGNOSIS — R42 Dizziness and giddiness: Secondary | ICD-10-CM

## 2022-01-20 NOTE — Progress Notes (Signed)
  Care Management   Follow Up Note   01/20/2022 Name: ANAN DAPOLITO Sr. MRN: [id number] DOB: 1934/10/01   Referred by: Baruch Gouty, FNP Reason for referral : medication review  Reviewed medications with patient Patient has consulted with cardiology and ENT He continues taking potassium OTC and vitamin D -- levels appropriate/reviewed Continue all medications as prescribed   Regina Eck, PharmD, BCPS Clinical Pharmacist, [place]  [facility] Phone [phone]

## 2022-02-09 ENCOUNTER — Ambulatory Visit (INDEPENDENT_AMBULATORY_CARE_PROVIDER_SITE_OTHER): Payer: PPO | Admitting: Family Medicine

## 2022-02-09 ENCOUNTER — Encounter: Payer: Self-pay | Admitting: Family Medicine

## 2022-02-09 VITALS — BP 118/88 | HR 66 | Temp 94.0°F | Ht 72.0 in | Wt 196.0 lb

## 2022-02-09 DIAGNOSIS — E876 Hypokalemia: Secondary | ICD-10-CM | POA: Diagnosis not present

## 2022-02-09 DIAGNOSIS — E559 Vitamin D deficiency, unspecified: Secondary | ICD-10-CM

## 2022-02-09 DIAGNOSIS — L918 Other hypertrophic disorders of the skin: Secondary | ICD-10-CM
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
## 2022-02-10 LAB — BMP8+EGFR
BUN/Creatinine Ratio: 13 (ref 10–24)
BUN: 17 mg/dL (ref 8–27)
CO2: 22 mmol/L (ref 20–29)
Calcium: 9.4 mg/dL (ref 8.6–10.2)
Chloride: 102 mmol/L (ref 96–106)
Creatinine, Ser: 1.33 mg/dL — ABNORMAL HIGH (ref 0.76–1.27)
Glucose: 104 mg/dL — ABNORMAL HIGH (ref 70–99)
Potassium: 4.1 mmol/L (ref 3.5–5.2)
Sodium: 138 mmol/L (ref 134–144)
eGFR: 52 mL/min/{1.73_m2} — ABNORMAL LOW (ref >59)

## 2022-02-10 LAB — VITAMIN D 25 HYDROXY (VIT D DEFICIENCY, FRACTURES): Vit D, 25-Hydroxy: 35.3 ng/mL (ref 30.0–100.0)

## 2022-02-11 NOTE — Addendum Note (Signed)
Addended by: Nigel Berthold C on: 02/11/2022 11:33 AM   Modules accepted: Orders
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
## 2022-03-04 ENCOUNTER — Ambulatory Visit: Payer: PPO | Attending: Internal Medicine | Admitting: Internal Medicine

## 2022-03-04 ENCOUNTER — Encounter: Payer: Self-pay | Admitting: Internal Medicine

## 2022-03-04 ENCOUNTER — Other Ambulatory Visit (HOSPITAL_COMMUNITY)
Admission: RE | Admit: 2022-03-04 | Discharge: 2022-03-04 | Disposition: A | Discharge status: Home / Self Care | Payer: PPO | Source: Ambulatory Visit | Attending: Internal Medicine | Admitting: Internal Medicine

## 2022-03-04 VITALS — BP 120/68 | HR 62 | Ht 72.0 in | Wt 198.4 lb

## 2022-03-04 DIAGNOSIS — Z79899 Other long term (current) drug therapy: Secondary | ICD-10-CM | POA: Diagnosis not present

## 2022-03-04 DIAGNOSIS — R42 Dizziness and giddiness: Secondary | ICD-10-CM | POA: Diagnosis not present

## 2022-03-04 DIAGNOSIS — Z131 Encounter for screening for diabetes mellitus: Secondary | ICD-10-CM

## 2022-03-04 DIAGNOSIS — E559 Vitamin D deficiency, unspecified: Secondary | ICD-10-CM

## 2022-03-04 LAB — BASIC METABOLIC PANEL
Anion gap: 8 (ref 5–15)
BUN: 20 mg/dL (ref 8–23)
CO2: 26 mmol/L (ref 22–32)
Calcium: 9.4 mg/dL (ref 8.9–10.3)
Chloride: 105 mmol/L (ref 98–111)
Creatinine, Ser: 1.45 mg/dL — ABNORMAL HIGH (ref 0.61–1.24)
GFR, Estimated: 47 mL/min — ABNORMAL LOW (ref >60)
Glucose, Bld: 102 mg/dL — ABNORMAL HIGH (ref 70–99)
Potassium: 4.4 mmol/L (ref 3.5–5.1)
Sodium: 139 mmol/L (ref 135–145)

## 2022-03-04 LAB — HEMOGLOBIN A1C
Hgb A1c MFr Bld: 5.3 % (ref 4.8–5.6)
Mean Plasma Glucose: 105.41 mg/dL

## 2022-03-04 LAB — CBC
HCT: 48.4 % (ref 39.0–52.0)
Hemoglobin: 16.7 g/dL (ref 13.0–17.0)
MCH: 32.7 pg (ref 26.0–34.0)
MCHC: 34.5 g/dL (ref 30.0–36.0)
MCV: 94.9 fL (ref 80.0–100.0)
Platelets: 137 10*3/uL — ABNORMAL LOW (ref 150–400)
RBC: 5.1 MIL/uL (ref 4.22–5.81)
RDW: 14.2 % (ref 11.5–15.5)
WBC: 6.7 10*3/uL (ref 4.0–10.5)
nRBC: 0 % (ref 0.0–0.2)

## 2022-03-04 NOTE — Patient Instructions (Addendum)
Medication Instructions:  Your physician recommends that you continue on your current medications as directed. Please refer to the Current Medication list given to you today.  *If you need a refill on your cardiac medications before your next appointment, please call your pharmacy*   Lab Work: Your physician recommends that you return for lab work in: Today ( A1c, BMET, Vit D, CBC)   If you have labs (blood work) drawn today and your tests are completely normal, you will receive your results only by: MyChart Message (if you have MyChart) OR A paper copy in the mail If you have any lab test that is abnormal or we need to change your treatment, we will call you to review the results.   Testing/Procedures: NONE    Follow-Up: At [facility], you and your health needs are our priority.  As part of our continuing mission to provide you with exceptional heart care, we have created designated Provider Care Teams.  These Care Teams include your primary Cardiologist (physician) and Advanced Practice Providers (APPs -  Physician Assistants and Nurse Practitioners) who all work together to provide you with the care you need, when you need it.  We recommend signing up for the patient portal called "MyChart".  Sign up information is provided on this After Visit Summary.  MyChart is used to connect with patients for Virtual Visits (Telemedicine).  Patients are able to view lab/test results, encounter notes, upcoming appointments, etc.  Non-urgent messages can be sent to your provider as well.   To learn more about what you can do with MyChart, go to [email or web address].    Your next appointment:    April / May   The format for your next appointment:   In Person  Provider:   Dorris Carnes, MD    Other Instructions Thank you for choosing [facility]!    Important Information About Sugar

## 2022-03-05 NOTE — Addendum Note (Signed)
Addended by: Levonne Hubert on: 03/05/2022 05:09 PM   Modules accepted: Orders

## 2022-03-08 ENCOUNTER — Other Ambulatory Visit: Payer: Self-pay

## 2022-03-08 DIAGNOSIS — R42 Dizziness and giddiness: Secondary | ICD-10-CM

## 2022-03-14 LAB — VITAMIN D 1,25 DIHYDROXY
Vitamin D 1, 25 (OH)2 Total: 27 pg/mL
Vitamin D2 1, 25 (OH)2: <10 pg/mL
Vitamin D3 1, 25 (OH)2: 27 pg/mL

## 2022-03-24 ENCOUNTER — Ambulatory Visit (INDEPENDENT_AMBULATORY_CARE_PROVIDER_SITE_OTHER): Payer: PPO | Admitting: Family Medicine

## 2022-03-24 ENCOUNTER — Encounter: Payer: Self-pay | Admitting: Family Medicine

## 2022-03-24 DIAGNOSIS — K529 Noninfective gastroenteritis and colitis, unspecified: Secondary | ICD-10-CM | POA: Diagnosis not present

## 2022-03-24 DIAGNOSIS — K219 Gastro-esophageal reflux disease without esophagitis: Secondary | ICD-10-CM

## 2022-03-24 MED ORDER — LANSOPRAZOLE 15 MG PO CPDR: 15.0000 mg | DELAYED_RELEASE_CAPSULE | Freq: Two times a day (BID) | ORAL | 3 refills | Status: DC
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
## 2022-04-20 ENCOUNTER — Ambulatory Visit (INDEPENDENT_AMBULATORY_CARE_PROVIDER_SITE_OTHER): Payer: PPO | Admitting: Nurse Practitioner

## 2022-04-20 ENCOUNTER — Encounter: Payer: Self-pay | Admitting: Nurse Practitioner

## 2022-04-20 DIAGNOSIS — H9202 Otalgia, left ear: Secondary | ICD-10-CM | POA: Diagnosis not present

## 2022-04-20 DIAGNOSIS — R197 Diarrhea, unspecified: Secondary | ICD-10-CM | POA: Diagnosis not present

## 2022-04-20 DIAGNOSIS — R509 Fever, unspecified: Secondary | ICD-10-CM

## 2022-04-20 MED ORDER — AMOXICILLIN 875 MG PO TABS: 875.0000 mg | ORAL_TABLET | Freq: Two times a day (BID) | ORAL | 0 refills | Status: DC

## 2022-04-20 NOTE — Progress Notes (Signed)
Virtual Visit  Note Due to COVID-19 pandemic this visit was conducted virtually. This visit type was conducted due to national recommendations for restrictions regarding the COVID-19 Pandemic (e.g. social distancing, sheltering in place) in an effort to limit this patient's exposure and mitigate transmission in our community. All issues noted in this document were discussed and addressed.  A physical exam was not performed with this format.  I connected with Ronald Fear Sr. on 04/20/22 at 4 pm by telephone and verified that I am speaking with the correct person using two identifiers. Ronald Fear Sr. is currently located at home during visit. The provider, Ivy Lynn, NP is located in their office at time of visit.  I discussed the limitations, risks, security and privacy concerns of performing an evaluation and management service by telephone and the availability of in person appointments. I also discussed with the patient that there may be a patient responsible charge related to this service. The patient expressed understanding and agreed to proceed.   History and Present Illness:  Otalgia  There is pain in the left ear. This is a new problem. The current episode started yesterday. The problem has been unchanged. There has been no fever. The pain is moderate. Associated symptoms include abdominal pain and diarrhea. Pertinent negatives include no rash. He has tried acetaminophen for the symptoms. The treatment provided no relief.  Abdominal Pain This is a new problem. The current episode started yesterday. The onset quality is gradual. The problem has been unchanged. The pain is located in the generalized abdominal region. The pain is moderate. The quality of the pain is aching. The abdominal pain does not radiate. Associated symptoms include diarrhea, a fever and nausea. The pain is aggravated by eating. The pain is relieved by Nothing. The treatment provided no relief.      Review  of Systems  Constitutional:  Positive for fever.  HENT:  Positive for ear pain.   Cardiovascular: Negative.   Gastrointestinal:  Positive for abdominal pain, diarrhea and nausea.  Genitourinary: Negative.   Skin: Negative.  Negative for rash.  All other systems reviewed and are negative.    Observations/Objective: Televisit patient not in distress  Assessment and Plan: Patient presents with symptoms of ear infection with pain, fever Take meds as prescribed and intermittent diarrhea for the past 2 days.  Patient has a history of diarrhea and recurrent abdominal pain. -Advised patient to modify diet to brat -Patient will complete at home COVID-19 test and report back to clinic. -I will be treating patient's ear infection symptoms as he presents with fever as well. - Use a cool mist humidifier  -Use saline nose sprays frequently -Force fluids -For fever or aches or pains- take Tylenol or ibuprofen. -If symptoms do not improve, she may need to be COVID tested to rule this out Follow up with worsening unresolved symptoms   Follow Up Instructions: Follow-up with unresolved symptoms.    I discussed the assessment and treatment plan with the patient. The patient was provided an opportunity to ask questions and all were answered. The patient agreed with the plan and demonstrated an understanding of the instructions.   The patient was advised to call back or seek an in-person evaluation if the symptoms worsen or if the condition fails to improve as anticipated.  The above assessment and management plan was discussed with the patient. The patient verbalized understanding of and has agreed to the management plan. Patient is aware to call the  clinic if symptoms persist or worsen. Patient is aware when to return to the clinic for a follow-up visit. Patient educated on when it is appropriate to go to the emergency department.   Time call ended: 4:15 pm   I provided 15 minutes of  non  face-to-face time during this encounter.    Ivy Lynn, NP

## 2022-04-20 NOTE — Patient Instructions (Signed)
Otitis Media, Adult  Otitis media is a condition in which the middle ear is red and swollen (inflamed) and full of fluid. The middle ear is the part of the ear that contains bones for hearing as well as air that helps send sounds to the brain. The condition usually goes away on its own. What are the causes? This condition is caused by a blockage in the eustachian tube. This tube connects the middle ear to the back of the nose. It normally allows air into the middle ear. The blockage is caused by fluid or swelling. Problems that can cause blockage include: A cold or infection that affects the nose, mouth, or throat. Allergies. An irritant, such as tobacco smoke. Adenoids that have become large. The adenoids are soft tissue located in the back of the throat, behind the nose and the roof of the mouth. Growth or swelling in the upper part of the throat, just behind the nose (nasopharynx). Damage to the ear caused by a change in pressure. This is called barotrauma. What increases the risk? You are more likely to develop this condition if you: Smoke or are exposed to tobacco smoke. Have an opening in the roof of your mouth (cleft palate). Have acid reflux. Have problems in your body's defense system (immune system). What are the signs or symptoms? Symptoms of this condition include: Ear pain. Fever. Problems with hearing. Being tired. Fluid leaking from the ear. Ringing in the ear. How is this treated? This condition can go away on its own within 3-5 days. But if the condition is caused by germs (bacteria) and does not go away on its own, or if it keeps coming back, your doctor may: Give you antibiotic medicines. Give you medicines for pain. Follow these instructions at home: Take over-the-counter and prescription medicines only as told by your doctor. If you were prescribed an antibiotic medicine, take it as told by your doctor. Do not stop taking it even if you start to feel better. Keep  all follow-up visits. Contact a doctor if: You have bleeding from your nose. There is a lump on your neck. You are not feeling better in 5 days. You feel worse instead of better. Get help right away if: You have pain that is not helped with medicine. You have swelling, redness, or pain around your ear. You get a stiff neck. You cannot move part of your face (paralysis). You notice that the bone behind your ear hurts when you touch it. You get a very bad headache. Summary Otitis media means that the middle ear is red, swollen, and full of fluid. This condition usually goes away on its own. If the problem does not go away, treatment may be needed. You may be given medicines to treat the infection or to treat your pain. If you were prescribed an antibiotic medicine, take it as told by your doctor. Do not stop taking it even if you start to feel better. Keep all follow-up visits. This information is not intended to replace advice given to you by your health care provider. Make sure you discuss any questions you have with your health care provider. Document Revised: 09/08/2020 Document Reviewed: 09/08/2020 Elsevier Patient Education  [place].

## 2022-04-21 ENCOUNTER — Other Ambulatory Visit: Payer: Self-pay | Admitting: Nurse Practitioner

## 2022-04-21 ENCOUNTER — Telehealth: Payer: Self-pay | Admitting: Family Medicine

## 2022-04-21 DIAGNOSIS — H6692 Otitis media, unspecified, left ear: Secondary | ICD-10-CM

## 2022-04-21 MED ORDER — AMOXICILLIN 500 MG PO CAPS: 500.0000 mg | ORAL_CAPSULE | Freq: Two times a day (BID) | ORAL | 0 refills | Status: DC

## 2022-04-21 NOTE — Telephone Encounter (Signed)
I sent in capsule, lower dose to pharmacy. thanks

## 2022-04-21 NOTE — Telephone Encounter (Signed)
Pt aware.

## 2022-04-22 ENCOUNTER — Other Ambulatory Visit: Payer: Self-pay | Admitting: Family Medicine

## 2022-04-22 ENCOUNTER — Telehealth: Payer: Self-pay | Admitting: Family Medicine

## 2022-04-22 DIAGNOSIS — R059 Cough, unspecified: Secondary | ICD-10-CM

## 2022-04-22 DIAGNOSIS — R509 Fever, unspecified: Secondary | ICD-10-CM | POA: Diagnosis not present

## 2022-04-22 NOTE — Telephone Encounter (Signed)
Patient aware and verbalizes understanding. 

## 2022-04-23 LAB — COVID-19, FLU A+B AND RSV
Influenza A, NAA: NOT DETECTED
Influenza B, NAA: NOT DETECTED
RSV, NAA: NOT DETECTED
SARS-CoV-2, NAA: NOT DETECTED

## 2022-05-14 ENCOUNTER — Telehealth: Payer: Self-pay | Admitting: Family Medicine

## 2022-05-14 ENCOUNTER — Other Ambulatory Visit: Payer: PPO

## 2022-05-14 DIAGNOSIS — E876 Hypokalemia: Secondary | ICD-10-CM

## 2022-05-14 NOTE — Telephone Encounter (Signed)
Pt is aware there is a future order for potassium in EP[facility] and he can come have it drawn. Pt voiced understanding.

## 2022-05-14 NOTE — Telephone Encounter (Signed)
Patient wants to know if orders can be added to check his potassium due to him getting off of his potassium medication. Please call back.

## 2022-05-15 LAB — POTASSIUM: Potassium: 4.1 mmol/L (ref 3.5–5.2)

## 2022-05-28 ENCOUNTER — Other Ambulatory Visit: Payer: Self-pay | Admitting: Family Medicine

## 2022-05-28 DIAGNOSIS — K644 Residual hemorrhoidal skin tags: Secondary | ICD-10-CM

## 2022-06-03 ENCOUNTER — Other Ambulatory Visit: Payer: Self-pay | Admitting: Family Medicine

## 2022-06-03 DIAGNOSIS — M79661 Pain in right lower leg: Secondary | ICD-10-CM

## 2022-06-10 ENCOUNTER — Telehealth (INDEPENDENT_AMBULATORY_CARE_PROVIDER_SITE_OTHER): Payer: PPO | Admitting: Nurse Practitioner

## 2022-06-10 ENCOUNTER — Encounter: Payer: Self-pay | Admitting: Nurse Practitioner

## 2022-06-10 ENCOUNTER — Encounter: Payer: Self-pay | Admitting: Gastroenterology

## 2022-06-10 DIAGNOSIS — H6690 Otitis media, unspecified, unspecified ear: Secondary | ICD-10-CM | POA: Diagnosis not present

## 2022-06-10 DIAGNOSIS — Z87891 Personal history of nicotine dependence: Secondary | ICD-10-CM | POA: Diagnosis not present

## 2022-06-10 DIAGNOSIS — I25118 Atherosclerotic heart disease of native coronary artery with other forms of angina pectoris: Secondary | ICD-10-CM | POA: Diagnosis not present

## 2022-06-10 DIAGNOSIS — Z66 Do not resuscitate: Secondary | ICD-10-CM | POA: Diagnosis not present

## 2022-06-10 DIAGNOSIS — J449 Chronic obstructive pulmonary disease, unspecified: Secondary | ICD-10-CM | POA: Diagnosis not present

## 2022-06-10 DIAGNOSIS — Z951 Presence of aortocoronary bypass graft: Secondary | ICD-10-CM | POA: Diagnosis not present

## 2022-06-10 DIAGNOSIS — R197 Diarrhea, unspecified: Secondary | ICD-10-CM | POA: Diagnosis not present

## 2022-06-10 DIAGNOSIS — K649 Unspecified hemorrhoids: Secondary | ICD-10-CM

## 2022-06-10 NOTE — Patient Instructions (Signed)
Hemorrhoids Hemorrhoids are swollen veins that may develop: In the butt (rectum). These are called internal hemorrhoids. Around the opening of the butt (anus). These are called external hemorrhoids. Hemorrhoids can cause pain, itching, or bleeding. Most of the time, they do not cause serious problems. They usually get better with diet changes, lifestyle changes, and other home treatments. What are the causes? This condition may be caused by: Having trouble pooping (constipation). Pushing hard (straining) to poop. Watery poop (diarrhea). Pregnancy. Being very overweight (obese). Sitting for long periods of time. Heavy lifting or other activity that causes you to strain. Anal sex. Riding a bike for a long period of time. What are the signs or symptoms? Symptoms of this condition include: Pain. Itching or soreness in the butt. Bleeding from the butt. Leaking poop. Swelling in the area. One or more lumps around the opening of your butt. How is this diagnosed? A doctor can often diagnose this condition by looking at the affected area. The doctor may also: Do an exam that involves feeling the area with a gloved hand (digital rectal exam). Examine the area inside your butt using a small tube (anoscope). Order blood tests. This may be done if you have lost a lot of blood. Have you get a test that involves looking inside the colon using a flexible tube with a camera on the end (sigmoidoscopy or colonoscopy). How is this treated? This condition can usually be treated at home. Your doctor may tell you to change what you eat, make lifestyle changes, or try home treatments. If these do not help, procedures can be done to remove the hemorrhoids or make them smaller. These may involve: Placing rubber bands at the base of the hemorrhoids to cut off their blood supply. Injecting medicine into the hemorrhoids to shrink them. Shining a type of light energy onto the hemorrhoids to cause them to fall  off. Doing surgery to remove the hemorrhoids or cut off their blood supply. Follow these instructions at home: Eating and drinking  Eat foods that have a lot of fiber in them. These include whole grains, beans, nuts, fruits, and vegetables. Ask your doctor about taking products that have added fiber (fibersupplements). Reduce the amount of fat in your diet. You can do this by: Eating low-fat dairy products. Eating less red meat. Avoiding processed foods. Drink enough fluid to keep your pee (urine) pale yellow. Managing pain and swelling  Take a warm-water bath (sitz bath) for 20 minutes to ease pain. Do this 3-4 times a day. You may do this in a bathtub or using a portable sitz bath that fits over the toilet. If told, put ice on the painful area. It may be helpful to use ice between your warm baths. Put ice in a plastic bag. Place a towel between your skin and the bag. Leave the ice on for 20 minutes, 2-3 times a day. General instructions Take over-the-counter and prescription medicines only as told by your doctor. Medicated creams and medicines may be used as told. Exercise often. Ask your doctor how much and what kind of exercise is best for you. Go to the bathroom when you have the urge to poop. Do not wait. Avoid pushing too hard when you poop. Keep your butt dry and clean. Use wet toilet paper or moist towelettes after pooping. Do not sit on the toilet for a long time. Keep all follow-up visits as told by your doctor. This is important. Contact a doctor if you: Have pain and   swelling that do not get better with treatment or medicine. Have trouble pooping. Cannot poop. Have pain or swelling outside the area of the hemorrhoids. Get help right away if you have: Bleeding that will not stop. Summary Hemorrhoids are swollen veins in the butt or around the opening of the butt. They can cause pain, itching, or bleeding. Eat foods that have a lot of fiber in them. These include  whole grains, beans, nuts, fruits, and vegetables. Take a warm-water bath (sitz bath) for 20 minutes to ease pain. Do this 3-4 times a day. This information is not intended to replace advice given to you by your health care provider. Make sure you discuss any questions you have with your health care provider. Document Revised: 12/09/2020 Document Reviewed: 12/10/2020 Elsevier Patient Education  2023 Elsevier Inc.  
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
## 2022-06-15 ENCOUNTER — Ambulatory Visit: Payer: PPO | Admitting: Nurse Practitioner

## 2022-06-17 ENCOUNTER — Telehealth: Payer: Self-pay | Admitting: Family Medicine

## 2022-06-17 DIAGNOSIS — K644 Residual hemorrhoidal skin tags: Secondary | ICD-10-CM

## 2022-06-17 MED ORDER — HYDROCORTISONE ACETATE 25 MG RE SUPP: RECTAL | 0 refills | Status: AC

## 2022-06-17 NOTE — Telephone Encounter (Signed)
Patient would like to have Anusol Cream called in to [facility] in [place] - had appointment 12/28 for hemorrhoids

## 2022-06-25 ENCOUNTER — Ambulatory Visit
Admission: RE | Admit: 2022-06-25 | Discharge: 2022-06-25 | Disposition: A | Discharge status: Home / Self Care | Payer: PPO | Source: Ambulatory Visit | Attending: Gastroenterology | Admitting: Gastroenterology

## 2022-06-25 ENCOUNTER — Other Ambulatory Visit: Payer: Self-pay | Admitting: Gastroenterology

## 2022-06-25 DIAGNOSIS — K5900 Constipation, unspecified: Secondary | ICD-10-CM

## 2022-06-25 DIAGNOSIS — K219 Gastro-esophageal reflux disease without esophagitis: Secondary | ICD-10-CM | POA: Diagnosis not present

## 2022-06-25 DIAGNOSIS — R109 Unspecified abdominal pain: Secondary | ICD-10-CM | POA: Diagnosis not present

## 2022-06-25 DIAGNOSIS — Z1211 Encounter for screening for malignant neoplasm of colon: Secondary | ICD-10-CM | POA: Diagnosis not present

## 2022-06-25 DIAGNOSIS — K22719 Barrett's esophagus with dysplasia, unspecified: Secondary | ICD-10-CM | POA: Diagnosis not present

## 2022-07-05 ENCOUNTER — Other Ambulatory Visit: Payer: Self-pay | Admitting: Gastroenterology

## 2022-07-05 ENCOUNTER — Telehealth (INDEPENDENT_AMBULATORY_CARE_PROVIDER_SITE_OTHER): Payer: PPO | Admitting: Family Medicine

## 2022-07-05 ENCOUNTER — Encounter: Payer: Self-pay | Admitting: Family Medicine

## 2022-07-05 DIAGNOSIS — R197 Diarrhea, unspecified: Secondary | ICD-10-CM

## 2022-07-05 DIAGNOSIS — R2689 Other abnormalities of gait and mobility: Secondary | ICD-10-CM | POA: Insufficient documentation

## 2022-07-05 DIAGNOSIS — R1011 Right upper quadrant pain: Secondary | ICD-10-CM
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
## 2022-07-14 ENCOUNTER — Ambulatory Visit
Admission: RE | Admit: 2022-07-14 | Discharge: 2022-07-14 | Disposition: A | Discharge status: Home / Self Care | Payer: PPO | Source: Ambulatory Visit | Attending: Gastroenterology | Admitting: Gastroenterology

## 2022-07-14 DIAGNOSIS — K760 Fatty (change of) liver, not elsewhere classified: Secondary | ICD-10-CM | POA: Diagnosis not present

## 2022-07-14 DIAGNOSIS — R109 Unspecified abdominal pain: Secondary | ICD-10-CM | POA: Diagnosis not present

## 2022-07-14 DIAGNOSIS — Z9049 Acquired absence of other specified parts of digestive tract: Secondary | ICD-10-CM | POA: Diagnosis not present

## 2022-07-14 DIAGNOSIS — R1011 Right upper quadrant pain: Secondary | ICD-10-CM

## 2022-07-16 ENCOUNTER — Other Ambulatory Visit: Payer: Self-pay | Admitting: Gastroenterology

## 2022-07-16 ENCOUNTER — Other Ambulatory Visit (HOSPITAL_COMMUNITY): Payer: Self-pay | Admitting: Gastroenterology

## 2022-07-16 DIAGNOSIS — R1030 Lower abdominal pain, unspecified: Secondary | ICD-10-CM

## 2022-07-19 ENCOUNTER — Ambulatory Visit: Payer: PPO | Admitting: Gastroenterology

## 2022-07-27 ENCOUNTER — Ambulatory Visit (HOSPITAL_BASED_OUTPATIENT_CLINIC_OR_DEPARTMENT_OTHER)
Admission: RE | Admit: 2022-07-27 | Discharge: 2022-07-27 | Disposition: A | Discharge status: Home / Self Care | Payer: No Typology Code available for payment source | Source: Ambulatory Visit | Attending: Gastroenterology | Admitting: Gastroenterology

## 2022-07-27 ENCOUNTER — Other Ambulatory Visit: Payer: PPO

## 2022-07-27 DIAGNOSIS — R1030 Lower abdominal pain, unspecified: Secondary | ICD-10-CM

## 2022-07-27 LAB — POCT I-STAT CREATININE: Creatinine, Ser: 1.4 mg/dL — ABNORMAL HIGH (ref 0.61–1.24)

## 2022-07-27 MED ORDER — IOHEXOL 300 MG/ML  SOLN
100.0000 mL | Freq: Once | INTRAMUSCULAR | Status: AC | PRN
  Administered 2022-07-27: 85 mL via INTRAVENOUS

## 2022-08-05 ENCOUNTER — Ambulatory Visit (INDEPENDENT_AMBULATORY_CARE_PROVIDER_SITE_OTHER): Payer: PPO | Admitting: Family Medicine

## 2022-08-05 ENCOUNTER — Encounter: Payer: Self-pay | Admitting: Family Medicine

## 2022-08-05 VITALS — BP 113/60 | HR 66 | Temp 97.7°F | Ht 72.0 in | Wt 198.6 lb

## 2022-08-05 DIAGNOSIS — E876 Hypokalemia: Secondary | ICD-10-CM | POA: Diagnosis not present

## 2022-08-05 DIAGNOSIS — R911 Solitary pulmonary nodule: Secondary | ICD-10-CM | POA: Diagnosis not present

## 2022-08-05 NOTE — Progress Notes (Signed)
Subjective:  Patient ID: Ronald Morrow., male    DOB: 11-04-1934, 88 y.o.   MRN: [id number]  Patient Care Team: Baruch Gouty, FNP as PCP - General (Family Medicine) Fay Records, MD as PCP - Cardiology (Cardiology) Burtis Junes, NP (Inactive) as Nurse Practitioner (Nurse Practitioner) Lavera Guise, [facility] as [place] Management (Pharmacist)   Chief Complaint:  Results (CT results)   HPI: Ronald Mccaffrey. is a 88 y.o. male presenting on 08/05/2022 for Results (CT results)   Pt presents today to discuss abnormal findings on CT scan. He had a CT abd/pelvis which revealed a 6 mm nodule on RLL which was not present on previous imaging. He was told to follow up with his PCP for further evaluation and treatment. No night sweats, weight loss, hemoptysis, cough, shortness of breath, or fatigue. Does have a prior history of bladder cancer.  He would also like his potassium rechecked as it has been low in the past.     Relevant past medical, surgical, family, and social history reviewed and updated as indicated.  Allergies and medications reviewed and updated. Data reviewed: Chart in Epic.   Past Medical History:  Diagnosis Date   Allergy    Anxiety    Bladder cancer ([place]) 2010   Tx with BCG   CAD (coronary artery disease)    LHC 6/19: pLAD 75, mLAD 100, D2 75; pLCx 80, mLCx 70, EF 55-65 >> s/p CABG // Echo 3/18: EF 60-65, Gr 2 DD   Chronic bronchitis ([place])    "yearly the last 3 yrs" (02/13/2013)   Dysrhythmia    GERD (gastroesophageal reflux disease)    History of blood transfusion 1949   "seeral" (02/13/2013)   HOH (hard of hearing)    Hypertension    Ocular migraine    "not often" (02/13/2013)   Pneumonia    "last time ~ 2 yr ago; had it before that too" (02/13/2013)   Seasonal allergies    Shingles    has neuropathy since it happened in 6/17   Temporal arteritis ([place])    Thrombocytopenia ([place]) 11/29/2016    Past Surgical History:  Procedure  Laterality Date   ARTERY BIOPSY Left 01/09/2013   Procedure: BIOPSY TEMPORAL ARTERY;  Surgeon: Rozetta Nunnery, MD;  Location: [facility] [place];  Service: ENT;  Laterality: Left;   CARDIOVASCULAR STRESS TEST  07/2011   No evidence of ischemia; EF 79%   CHOLECYSTECTOMY N/A 11/30/2016   Procedure: LAPAROSCOPIC CHOLECYSTECTOMY;  Surgeon: Aviva Signs, MD;  Location: [facility];  Service: General;  Laterality: N/A;   COLONOSCOPY     CORONARY ARTERY BYPASS GRAFT N/A 12/07/2017   Procedure: CORONARY ARTERY BYPASS GRAFTING (CABG) times three using left internal mammary artery and left endoscopically harvested saphenous vein graft;  Surgeon: Melrose Nakayama, MD;  Location: [place];  Service: Open Heart Surgery;  Laterality: N/A;   CYSTOSCOPY WITH BIOPSY N/A 04/21/2020   Procedure: CYSTOSCOPY WITH BLADDER BIOPSY/ FULGURATION;  Surgeon: Raynelle Bring, MD;  Location: [facility];  Service: Urology;  Laterality: N/A;  ONLY NEEDS 45 MIN   LEFT HEART CATH AND CORONARY ANGIOGRAPHY N/A 12/02/2017   Procedure: LEFT HEART CATH AND CORONARY ANGIOGRAPHY;  Surgeon: Jettie Booze, MD;  Location: [facility];  Service: Cardiovascular;  Laterality: N/A;   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[place]    lower lower leg burn; "probably 4-5 ORs in  1949 for this" (02/13/2013)   SKIN GRAFT Right    upper and lower leg   TEE WITHOUT CARDIOVERSION N/A 12/07/2017   Procedure: TRANSESOPHAGEAL ECHOCARDIOGRAM (TEE);  Surgeon: Melrose Nakayama, MD;  Location: [facility];  Service: Open Heart Surgery;  Laterality: N/A;   TONSILLECTOMY  1940's   TRANSURETHRAL RESECTION OF BLADDER TUMOR  2010 X 3   "cancer" (02/13/2013)   VASECTOMY      Social History   Socioeconomic History   Marital status: Married    Spouse name: Ronald Russell   Number of children: 4   Years of education: GED   Highest education level: GED or equivalent  Occupational History   Occupation: Retired    Comment: Security  Tobacco Use    Smoking status: Former    Packs/day: 0.75    Years: 3.00    Total pack years: 2.25    Types: Cigarettes   Smokeless tobacco: Never   Tobacco comments:    02/13/2013 "quit smoking age 52"  Vaping Use   Vaping Use: Never used  Substance and Sexual Activity   Alcohol use: No    Comment: 02/13/2013 "probably a pint of whiskey/wk up til I was probably 88 yr old"   Drug use: No   Sexual activity: Yes    Birth control/protection: None  Other Topics Concern   Not on file  Social History Narrative   Lives with wife   Caffeine use: 15-16oz soda per day, no soda   Usually drinks 60 oz water daily   Social Determinants of Health   Financial Resource Strain: Low Risk  (12/04/2021)   Overall Financial Resource Strain (CARDIA)    Difficulty of Paying Living Expenses: Not hard at all  Food Insecurity: No Food Insecurity (12/04/2021)   Hunger Vital Sign    Worried About Running Out of Food in the Last Year: Never true    Reserve in the Last Year: Never true  Transportation Needs: No Transportation Needs (12/04/2021)   PRAPARE - Hydrologist (Medical): No    Lack of Transportation (Non-Medical): No  Physical Activity: Insufficiently Active (12/04/2021)   Exercise Vital Sign    Days of Exercise per Week: 5 days    Minutes of Exercise per Session: 20 min  Stress: No Stress Concern Present (12/04/2021)   [place]    Feeling of Stress : Only a little  Social Connections: Socially Integrated (12/04/2021)   Social Connection and Isolation Panel [NHANES]    Frequency of Communication with Friends and Family: More than three times a week    Frequency of Social Gatherings with Friends and Family: More than three times a week    Attends Religious Services: More than 4 times per year    Active Member of [employer] or Organizations: Yes    Attends Music therapist: More than 4 times per year     Marital Status: Married  Human resources officer Violence: Not At Risk (12/04/2021)   Humiliation, Afraid, Rape, and Kick questionnaire    Fear of Current or Ex-Partner: No    Emotionally Abused: No    Physically Abused: No    Sexually Abused: No    Outpatient Encounter Medications as of 08/05/2022  Medication Sig   acetaminophen (TYLENOL) 650 MG CR tablet Take 650 mg by mouth every 6 (six) hours as needed for pain.   Alirocumab (PRALUENT) 75 MG/ML SOAJ See admin instructions.  atenolol (TENORMIN) 25 MG tablet Take 1/4 tablet by mouth daily, may take additional 1/4  tablet daily as needed for palpitations   fluocinonide cream (LIDEX) 2.72 % APPLY 1 APPLICATION TOPICALLY TWICE A DAY   hydrocortisone (ANUSOL-HC) 25 MG suppository PLACE 1 SUPPOSITORY RECTALLY 2 TIMES DAILY.   lansoprazole (PREVACID) 15 MG capsule Take 1 capsule (15 mg total) by mouth 2 (two) times daily before a meal.   triamcinolone cream (KENALOG) 0.1 % Apply 1 application. topically 2 (two) times daily.   No facility-administered encounter medications on file as of 08/05/2022.    Allergies  Allergen Reactions   Uloric [Febuxostat] Palpitations, Other (See Comments) and Hypertension    Hypertension with palpitations and a sense of fuzziness and dizziness at the left side of his head   Cholestyramine Nausea Only   Doxazosin Diarrhea   Doxycycline Diarrhea   Omeprazole Other (See Comments)   Pantoprazole Other (See Comments)    Can tolerate 20mg    Pravastatin Other (See Comments)   Rosuvastatin Other (See Comments)   Augmentin [Amoxicillin-Pot Clavulanate] Diarrhea   Ciprofloxacin Diarrhea   Codeine Nausea And Vomiting and Other (See Comments)    Severe headache   Levaquin [Levofloxacin] Diarrhea   Oxycodone Nausea And Vomiting   Tramadol Nausea And Vomiting   Vicodin [Hydrocodone-Acetaminophen] Nausea And Vomiting    Review of Systems  Constitutional:  Negative for activity change, appetite change, chills,  diaphoresis, fatigue, fever and unexpected weight change.  Respiratory:  Negative for apnea, cough, choking, chest tightness, shortness of breath, wheezing and stridor.   Cardiovascular:  Negative for chest pain, palpitations and leg swelling.  Gastrointestinal:  Negative for abdominal pain.  Genitourinary:  Negative for decreased urine volume and difficulty urinating.  Neurological:  Negative for weakness and headaches.  Psychiatric/Behavioral:  Negative for confusion.   All other systems reviewed and are negative.       Objective:  BP 113/60   Pulse 66   Temp 97.7 F (36.5 C) (Temporal)   Ht 6' (1.829 m)   Wt 198 lb 9.6 oz (90.1 kg)   SpO2 95%   BMI 26.94 kg/m    Wt Readings from Last 3 Encounters:  08/05/22 198 lb 9.6 oz (90.1 kg)  03/04/22 198 lb 6.4 oz (90 kg)  02/09/22 196 lb (88.9 kg)    Physical Exam Vitals and nursing note reviewed.  Constitutional:      General: He is not in acute distress.    Appearance: Normal appearance. He is not ill-appearing, toxic-appearing or diaphoretic.  HENT:     Head: Normocephalic and atraumatic.     Mouth/Throat:     Mouth: Mucous membranes are moist.  Eyes:     Conjunctiva/sclera: Conjunctivae normal.     Pupils: Pupils are equal, round, and reactive to light.  Cardiovascular:     Rate and Rhythm: Normal rate and regular rhythm.  Pulmonary:     Effort: Pulmonary effort is normal.     Breath sounds: Normal breath sounds. No rhonchi or rales.  Musculoskeletal:     Cervical back: Neck supple.  Skin:    General: Skin is warm and dry.     Capillary Refill: Capillary refill takes less than 2 seconds.  Neurological:     General: No focal deficit present.     Mental Status: He is alert and oriented to person, place, and time.  Psychiatric:        Mood and Affect: Mood normal.  Behavior: Behavior normal.        Thought Content: Thought content normal.        Judgment: Judgment normal.     Results for orders placed or  performed during the hospital encounter of 07/27/22  I-STAT creatinine  Result Value Ref Range   Creatinine, Ser 1.40 (H) 0.61 - 1.24 mg/dL       Pertinent labs & imaging results that were available during my care of the patient were reviewed by me and considered in my medical decision making.  Assessment & Plan:  Yong was seen today for results.  Diagnoses and all orders for this visit:  Lung nodule seen on imaging study CT scan for further evaluation. Further treatment pending results.  -     CT Chest W Contrast; Future  Hypokalemia Will repeat labs today.  -     BMP8+EGFR     Continue all other maintenance medications.  Follow up plan: Return in about 3 months (around 11/03/2022) for chronic follow up.   Continue healthy lifestyle choices, including diet (rich in fruits, vegetables, and lean proteins, and low in salt and simple carbohydrates) and exercise (at least 30 minutes of moderate physical activity daily).  Educational handout given for pulmonary nodules  The above assessment and management plan was discussed with the patient. The patient verbalized understanding of and has agreed to the management plan. Patient is aware to call the clinic if they develop any new symptoms or if symptoms persist or worsen. Patient is aware when to return to the clinic for a follow-up visit. Patient educated on when it is appropriate to go to the emergency department.   Monia Pouch, FNP-C [facility] [phone]

## 2022-08-06 DIAGNOSIS — R82998 Other abnormal findings in urine: Secondary | ICD-10-CM | POA: Diagnosis not present

## 2022-08-06 DIAGNOSIS — Z8551 Personal history of malignant neoplasm of bladder: Secondary | ICD-10-CM | POA: Diagnosis not present

## 2022-08-06 LAB — BMP8+EGFR
BUN/Creatinine Ratio: 11 (ref 10–24)
BUN: 17 mg/dL (ref 8–27)
CO2: 23 mmol/L (ref 20–29)
Calcium: 9.5 mg/dL (ref 8.6–10.2)
Chloride: 103 mmol/L (ref 96–106)
Creatinine, Ser: 1.52 mg/dL — ABNORMAL HIGH (ref 0.76–1.27)
Glucose: 114 mg/dL — ABNORMAL HIGH (ref 70–99)
Potassium: 4.3 mmol/L (ref 3.5–5.2)
Sodium: 142 mmol/L (ref 134–144)
eGFR: 44 mL/min/{1.73_m2} — ABNORMAL LOW (ref >59)

## 2022-08-17 ENCOUNTER — Ambulatory Visit (HOSPITAL_BASED_OUTPATIENT_CLINIC_OR_DEPARTMENT_OTHER): Payer: No Typology Code available for payment source

## 2022-08-20 ENCOUNTER — Ambulatory Visit (HOSPITAL_BASED_OUTPATIENT_CLINIC_OR_DEPARTMENT_OTHER)
Admission: RE | Admit: 2022-08-20 | Discharge: 2022-08-20 | Disposition: A | Discharge status: Home / Self Care | Payer: PPO | Source: Ambulatory Visit | Attending: Family Medicine | Admitting: Family Medicine

## 2022-08-20 DIAGNOSIS — R911 Solitary pulmonary nodule: Secondary | ICD-10-CM | POA: Insufficient documentation

## 2022-08-20 DIAGNOSIS — R918 Other nonspecific abnormal finding of lung field: Secondary | ICD-10-CM | POA: Diagnosis not present

## 2022-08-20 MED ORDER — IOHEXOL 300 MG/ML  SOLN
100.0000 mL | Freq: Once | INTRAMUSCULAR | Status: AC | PRN
  Administered 2022-08-20: 75 mL via INTRAVENOUS

## 2022-08-23 ENCOUNTER — Telehealth: Payer: Self-pay | Admitting: Internal Medicine

## 2022-08-23 NOTE — Telephone Encounter (Signed)
Spoke to pt who stated that last week he was working outside and he picked up a concrete bench and since then has not felt right. Patient stated that he feels like his heart is skipping beats with more noticeable sob and dizziness. Patient stated he checks his bp and it has been fine.   Pt also stated that he had a chest CT completed this morning for PCP and there were some findings that troubled him.   Per CT:  IMPRESSION: 1. Stable 5 mm right lower lobe pulmonary nodule. Calcified left upper lobe pulmonary micronodule. No further follow-up indicated. 2. Small to moderate volume hiatal hernia. 3. Aortic Atherosclerosis (ICD10-I70.0) -severe including three-vessel coronary calcification.    Please advise.

## 2022-08-23 NOTE — Telephone Encounter (Signed)
Pt c/o Shortness Of Breath: STAT if SOB developed within the last 24 hours or pt is noticeably SOB on the phone  1. Are you currently SOB (can you hear that pt is SOB on the phone)? No   2. How long have you been experiencing SOB? About a week   3. Are you SOB when sitting or when up moving around? Moving around   4. Are you currently experiencing any other symptoms?  Pt states his heart feels like it has been skipping a beat, ever since he was out working in the yard.

## 2022-08-24 ENCOUNTER — Emergency Department (HOSPITAL_COMMUNITY)
Admission: EM | Admit: 2022-08-24 | Discharge: 2022-08-24 | Disposition: A | Discharge status: Home / Self Care | Payer: No Typology Code available for payment source | Attending: Emergency Medicine | Admitting: Emergency Medicine

## 2022-08-24 ENCOUNTER — Emergency Department (HOSPITAL_COMMUNITY): Payer: No Typology Code available for payment source

## 2022-08-24 ENCOUNTER — Encounter (HOSPITAL_COMMUNITY): Payer: Self-pay

## 2022-08-24 ENCOUNTER — Telehealth: Payer: Self-pay | Admitting: Family Medicine

## 2022-08-24 ENCOUNTER — Other Ambulatory Visit: Payer: Self-pay

## 2022-08-24 DIAGNOSIS — Z8551 Personal history of malignant neoplasm of bladder: Secondary | ICD-10-CM | POA: Insufficient documentation

## 2022-08-24 DIAGNOSIS — Z951 Presence of aortocoronary bypass graft: Secondary | ICD-10-CM | POA: Diagnosis not present

## 2022-08-24 DIAGNOSIS — R079 Chest pain, unspecified: Secondary | ICD-10-CM | POA: Diagnosis not present

## 2022-08-24 DIAGNOSIS — I2510 Atherosclerotic heart disease of native coronary artery without angina pectoris: Secondary | ICD-10-CM | POA: Diagnosis not present

## 2022-08-24 DIAGNOSIS — R0789 Other chest pain: Secondary | ICD-10-CM | POA: Insufficient documentation

## 2022-08-24 DIAGNOSIS — R0602 Shortness of breath: Secondary | ICD-10-CM | POA: Insufficient documentation

## 2022-08-24 DIAGNOSIS — I4510 Unspecified right bundle-branch block: Secondary | ICD-10-CM | POA: Diagnosis not present

## 2022-08-24 DIAGNOSIS — S8991XA Unspecified injury of right lower leg, initial encounter: Secondary | ICD-10-CM | POA: Diagnosis present

## 2022-08-24 DIAGNOSIS — I10 Essential (primary) hypertension: Secondary | ICD-10-CM | POA: Diagnosis not present

## 2022-08-24 DIAGNOSIS — X58XXXA Exposure to other specified factors, initial encounter: Secondary | ICD-10-CM | POA: Insufficient documentation

## 2022-08-24 DIAGNOSIS — I4430 Unspecified atrioventricular block: Secondary | ICD-10-CM | POA: Diagnosis not present

## 2022-08-24 DIAGNOSIS — S80811A Abrasion, right lower leg, initial encounter: Secondary | ICD-10-CM | POA: Diagnosis not present

## 2022-08-24 DIAGNOSIS — I440 Atrioventricular block, first degree: Secondary | ICD-10-CM | POA: Diagnosis not present

## 2022-08-24 LAB — BASIC METABOLIC PANEL
Anion gap: 9 (ref 5–15)
BUN: 15 mg/dL (ref 8–23)
CO2: 20 mmol/L — ABNORMAL LOW (ref 22–32)
Calcium: 8.7 mg/dL — ABNORMAL LOW (ref 8.9–10.3)
Chloride: 110 mmol/L (ref 98–111)
Creatinine, Ser: 1.39 mg/dL — ABNORMAL HIGH (ref 0.61–1.24)
GFR, Estimated: 49 mL/min — ABNORMAL LOW (ref >60)
Glucose, Bld: 99 mg/dL (ref 70–99)
Potassium: 3.6 mmol/L (ref 3.5–5.1)
Sodium: 139 mmol/L (ref 135–145)

## 2022-08-24 LAB — CBC
HCT: 46.8 % (ref 39.0–52.0)
Hemoglobin: 16.5 g/dL (ref 13.0–17.0)
MCH: 33 pg (ref 26.0–34.0)
MCHC: 35.3 g/dL (ref 30.0–36.0)
MCV: 93.6 fL (ref 80.0–100.0)
Platelets: 124 10*3/uL — ABNORMAL LOW (ref 150–400)
RBC: 5 MIL/uL (ref 4.22–5.81)
RDW: 13.7 % (ref 11.5–15.5)
WBC: 6.8 10*3/uL (ref 4.0–10.5)
nRBC: 0 % (ref 0.0–0.2)

## 2022-08-24 LAB — TROPONIN I (HIGH SENSITIVITY)
Troponin I (High Sensitivity): 6 ng/L (ref <18)
Troponin I (High Sensitivity): 7 ng/L (ref <18)

## 2022-08-24 LAB — D-DIMER, QUANTITATIVE: D-Dimer, Quant: 0.54 ug/mL-FEU — ABNORMAL HIGH (ref 0.00–0.50)

## 2022-08-24 NOTE — ED Provider Notes (Signed)
[facility] Provider Note   CSN: [id number] Arrival date & time: 08/24/22  1106     History  Chief Complaint  Patient presents with   Chest Pain    Ronald SCHNAIBLE Sr. is a 88 y.o. male.   Chest Pain Patient with chest pain.  Anterior chest.  Is had for around a week now.  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  Then that evening developed pain.  States cannot do the same activity and feels more short of breath.  Does get shortness of breath or tightness in the chest.  States it is not pain.  States he was trying to take a hold was not able to do it.  Even had trouble walking to the mailbox.  No fevers or chills.  No real coughing.  History of coronary artery disease and CABG.  States that he had pain once before the CABG and did not feel like this.    Past Medical History:  Diagnosis Date   Allergy    Anxiety    Bladder cancer ([place]) 2010   Tx with BCG   CAD (coronary artery disease)    LHC 6/19: pLAD 75, mLAD 100, D2 75; pLCx 80, mLCx 70, EF 55-65 >> s/p CABG // Echo 3/18: EF 60-65, Gr 2 DD   Chronic bronchitis ([place])    "yearly the last 3 yrs" (02/13/2013)   Dysrhythmia    GERD (gastroesophageal reflux disease)    History of blood transfusion 1949   "seeral" (02/13/2013)   HOH (hard of hearing)    Hypertension    Ocular migraine    "not often" (02/13/2013)   Pneumonia    "last time ~ 2 yr ago; had it before that too" (02/13/2013)   Seasonal allergies    Shingles    has neuropathy since it happened in 6/17   Temporal arteritis ([facility])    Thrombocytopenia ([place]) 11/29/2016    Home Medications Prior to Admission medications   Medication Sig Start Date End Date Taking? Authorizing Provider  acetaminophen (TYLENOL) 650 MG CR tablet Take 650 mg by mouth every 6 (six) hours as needed for pain.    [provider]  Alirocumab (PRALUENT) 75 MG/ML SOAJ See admin instructions.    [provider]  atenolol  (TENORMIN) 25 MG tablet Take 1/4 tablet by mouth daily, may take additional 1/4  tablet daily as needed for palpitations 08/28/21   Fay Records, MD  fluocinonide cream (LIDEX) [id number] % APPLY 1 APPLICATION TOPICALLY TWICE A DAY 06/03/22   Rakes, Connye Burkitt, FNP  hydrocortisone (ANUSOL-HC) 25 MG suppository PLACE 1 SUPPOSITORY RECTALLY 2 TIMES DAILY. 06/17/22   Hassell Done Mary-Margaret, FNP  lansoprazole (PREVACID) 15 MG capsule Take 1 capsule (15 mg total) by mouth 2 (two) times daily before a meal. 03/24/22   Rakes, Connye Burkitt, FNP  triamcinolone cream (KENALOG) 0.1 % Apply 1 application. topically 2 (two) times daily. 11/13/21   Gwenlyn Perking, FNP      Allergies    Uloric [febuxostat], Cholestyramine, Doxazosin, Doxycycline, Omeprazole, Pantoprazole, Pravastatin, Rosuvastatin, Augmentin [amoxicillin-pot clavulanate], Ciprofloxacin, Codeine, Levaquin [levofloxacin], Oxycodone, Tramadol, and Vicodin [hydrocodone-acetaminophen]    Review of Systems   Review of Systems  Cardiovascular:  Positive for chest pain.    Physical Exam Updated Vital Signs BP (!) 158/74   Pulse 62   Temp 97.6 F (36.4 C) (Oral)   Resp 17   Ht 6' (1.829 m)  Wt 86.2 kg   SpO2 97%   BMI 25.77 kg/m  Physical Exam Vitals and nursing note reviewed.  Cardiovascular:     Rate and Rhythm: Regular rhythm.  Pulmonary:     Breath sounds: No wheezing or rhonchi.  Chest:     Chest wall: No tenderness.  Abdominal:     Tenderness: There is no abdominal tenderness.  Skin:    Comments: Abrasion to right anterior lower leg.  Neurological:     Mental Status: He is alert.     ED Results / Procedures / Treatments   Labs (all labs ordered are listed, but only abnormal results are displayed) Labs Reviewed  BASIC METABOLIC PANEL - Abnormal; Notable for the following components:      Result Value   CO2 20 (*)    Creatinine, Ser 1.39 (*)    Calcium 8.7 (*)    GFR, Estimated 49 (*)    All other components within normal limits   CBC - Abnormal; Notable for the following components:   Platelets 124 (*)    All other components within normal limits  D-DIMER, QUANTITATIVE - Abnormal; Notable for the following components:   D-Dimer, Quant 0.54 (*)    All other components within normal limits  TROPONIN I (HIGH SENSITIVITY)  TROPONIN I (HIGH SENSITIVITY)    EKG EKG Interpretation  Date/Time:  Tuesday August 24 2022 11:24:54 EDT Ventricular Rate:  59 PR Interval:  236 QRS Duration: 124 QT Interval:  426 QTC Calculation: 421 R Axis:   -53 Text Interpretation: Sinus bradycardia with 1st degree A-V block Right bundle branch block Left anterior fascicular block * Bifascicular block  Cannot rule out Inferior infarct (masked by fascicular block?) , age undetermined Abnormal ECG When compared with ECG of 21-Jan-2021 15:59, No significant change since last tracing Confirmed by Davonna Belling [phone]) on 08/24/2022 11:52:06 AM  Radiology DG Chest 2 View  Result Date: 08/24/2022 CLINICAL DATA:  Shortness of breath, intermittent LEFT chest pain, weakness, history coronary artery disease, GERD, hypertension, former smoker, pneumonia EXAM: CHEST - 2 VIEW COMPARISON:  01/21/2021 FINDINGS: Normal heart size and pulmonary vascularity post CABG. Moderate-sized hiatal hernia. Lungs clear. No pulmonary infiltrate, pleural effusion, or pneumothorax. No acute osseous findings. IMPRESSION: Moderate-sized hiatal hernia. Post CABG. No acute abnormalities. Electronically Signed   By: Lavonia Dana M.D.   On: 08/24/2022 11:46    Procedures Procedures    Medications Ordered in ED Medications - No data to display  ED Course/ Medical Decision Making/ A&P                             Medical Decision Making Amount and/or Complexity of Data Reviewed Labs: ordered. Radiology: ordered.   Patient with chest pain 2.  Shortness of breath.  Anterior chest.  Has had over the last week.  Has had previous CABG.  States cannot do the same  activity but does come and go somewhat.  States exertion seems to bring it on but sometimes may be just fine.  Differential diagnosis includes coronary artery disease, PE, infection, nonspecific pain.  EKG reassuring.  Will get troponin and D-dimer with the pulmonary component at some pain on the right leg.  D-dimer negative.  Reviewed cardiology note and had negative stress test 2 years ago.  States he is not having exertional pain.  States he did have couple episodes but can exert himself without any pain now.  Troponin negative x 2.  D-dimer negative I think low enough risk to go home.  Will discharge.       Final Clinical Impression(s) / ED Diagnoses Final diagnoses:  Nonspecific chest pain    Rx / DC Orders ED Discharge Orders          Ordered    Ambulatory referral to Cardiology       Comments: If you have not heard from the Cardiology office within the next 72 hours please call [phone].   08/24/22 1513              Davonna Belling, MD 08/24/22 1521

## 2022-08-24 NOTE — ED Notes (Signed)
EDP at [facility]. Pt alert, NAD, calm, interactive.

## 2022-08-24 NOTE — ED Notes (Signed)
EDP at [facility] 

## 2022-08-24 NOTE — ED Notes (Signed)
Pt alert, NAD, calm, interactive, to xray via stretcher

## 2022-08-24 NOTE — Telephone Encounter (Signed)
Patient called in with chest pain and elevated blood pressure. He states that it feels like a stabbing pain. Patient was advised that he needed to be evaluated by the ER especially because of his Cardiac history. Patient agreed and will be evaluated by the ER.

## 2022-08-24 NOTE — ED Triage Notes (Addendum)
Pt arrives via EMS. Pt was lifting some concrete last Thursday and has been having intermittent left sided chest pain and sob since then. Pt doesn't believe the pain is in correlation to the lifting. Pt AxOx4. Reports he took '324mg'$  of aspirin today.

## 2022-08-24 NOTE — ED Notes (Signed)
Pt denies currently: pain, sob, nausea. Family arrives to [facility].

## 2022-08-25 NOTE — Progress Notes (Signed)
R/c

## 2022-08-27 DIAGNOSIS — K2270 Barrett's esophagus without dysplasia: Secondary | ICD-10-CM | POA: Diagnosis not present

## 2022-08-27 DIAGNOSIS — K219 Gastro-esophageal reflux disease without esophagitis: Secondary | ICD-10-CM | POA: Diagnosis not present

## 2022-08-27 DIAGNOSIS — K649 Unspecified hemorrhoids: Secondary | ICD-10-CM | POA: Diagnosis not present

## 2022-08-27 NOTE — Telephone Encounter (Signed)
Spoke to patient who verbalized that he was worried about his CT. Pt requested sooner appt to discuss CT results from PCP. Pt stated PCP refused to discuss severe aortic atherosclerosis noted on CT stating for him to see cardiology.

## 2022-08-27 NOTE — Telephone Encounter (Signed)
Patient states that he is waiting on a call back to decide what his next steps are. He went to the ED on 08/24/22. He would like a call back today before 5pm because he has not heard back regarding his next plan of care.

## 2022-08-31 ENCOUNTER — Encounter: Payer: Self-pay | Admitting: Student

## 2022-08-31 ENCOUNTER — Encounter: Payer: Self-pay | Admitting: *Deleted

## 2022-08-31 ENCOUNTER — Ambulatory Visit: Payer: No Typology Code available for payment source | Attending: Student | Admitting: Student

## 2022-08-31 VITALS — BP 118/68 | HR 62 | Ht 72.0 in | Wt 198.8 lb

## 2022-08-31 DIAGNOSIS — E785 Hyperlipidemia, unspecified: Secondary | ICD-10-CM

## 2022-08-31 DIAGNOSIS — R002 Palpitations: Secondary | ICD-10-CM

## 2022-08-31 DIAGNOSIS — I25118 Atherosclerotic heart disease of native coronary artery with other forms of angina pectoris: Secondary | ICD-10-CM | POA: Diagnosis not present

## 2022-08-31 DIAGNOSIS — R079 Chest pain, unspecified: Secondary | ICD-10-CM | POA: Diagnosis not present

## 2022-08-31 NOTE — Patient Instructions (Signed)
Medication Instructions:  Your physician recommends that you continue on your current medications as directed. Please refer to the Current Medication list given to you today.  *If you need a refill on your cardiac medications before your next appointment, please call your pharmacy*   Lab Work: NONE   If you have labs (blood work) drawn today and your tests are completely normal, you will receive your results only by: [facility] (if you have MyChart) OR A paper copy in the mail If you have any lab test that is abnormal or we need to change your treatment, we will call you to review the results.   Testing/Procedures: Your physician has requested that you have a lexiscan myoview. For further information please visit [email or web address]. Please follow instruction sheet, as given.    Follow-Up: At [facility], you and your health needs are our priority.  As part of our continuing mission to provide you with exceptional heart care, we have created designated Provider Care Teams.  These Care Teams include your primary Cardiologist (physician) and Advanced Practice Providers (APPs -  Physician Assistants and Nurse Practitioners) who all work together to provide you with the care you need, when you need it.  We recommend signing up for the patient portal called "MyChart".  Sign up information is provided on this After Visit Summary.  MyChart is used to connect with patients for Virtual Visits (Telemedicine).  Patients are able to view lab/test results, encounter notes, upcoming appointments, etc.  Non-urgent messages can be sent to your provider as well.   To learn more about what you can do with MyChart, go to [email or web address].    Your next appointment:    July   Provider:   Dorris Carnes, MD    Other Instructions Thank you for choosing [facility]!

## 2022-08-31 NOTE — Progress Notes (Signed)
Cardiology Office Note:    Date:  08/31/2022  ID:  Ronald Fear Sr., DOB [birth year], MRN [id number]  History of Present Illness:    Ronald Russell. is a 88 y.o. male with past medical history of CAD (s/p CABG in 11/2017), palpitations (PAC's and PVC's by prior monitor), HLD, temporal arteritis, vertigo and history of bladder cancer who presents to the office today for follow-up.   He was examined by Dr. Harrington Challenger in 02/2022 and reported intermittent dizziness which had been occurring since a prior diagnosis of [facility] Spotted Fever in 10/2021. Given his recent monitor in 12/2021 was overall benign and showed predominantly normal sinus rhythm with 3 brief episodes of SVT, further testing was not pursued.  In the interim, he was evaluated at [facility] ED on 08/24/2022 for chest pain. Reported moving cement and symptoms started afterwards. EKG showed no acute ST changes and troponin values were negative. He was discharged home and informed to follow-up with cardiology as an outpatient. He did have a Chest CT by his PCP earlier this month as well which showed a stable 5 mm right lower lobe pulmonary nodule and calcified left upper lobe nodule with no follow-up indicated. Was noted to have atherosclerosis and three-vessel coronary calcification.  In talking with the patient and his wife today, he reports still having odd sensations in his chest at times which feel different from his prior palpitations. Reports he "felt his life draining out of him" at the time of his ED evaluation. He had been moving a concrete bench earlier that day prior to symptoms and is unsure if his pain was musculoskeletal discomfort. Reports 1 episode of discomfort in his chest since which occurred while planting a tree. Reports his respiratory status has overall been stable. No specific orthopnea, PND or pitting edema. He still struggles with dizziness and has been followed at [facility] for this. Reports associated headaches as  well.   Studies Reviewed:    EKG: EKG is not ordered today. EKG from 08/24/2022 is reviewed and shows sinus bradycardia, HR 59 with 1st degree AV block, RBBB and LAFB. No acute ST changes.   NST: 01/2021 Defect 1: There is a medium defect of mild severity present in the basal inferior, mid inferior and apical inferior location. This is a low risk study. The left ventricular ejection fraction is hyperdynamic (>65%). Nuclear stress EF: 83%. There was no ST segment deviation noted during stress. No T wave inversion was noted during stress.   No evidence of ischemia. There is a medium defect of mild severity in the inferior wall, present at both rest and stress. Normal wall motion in this area. Given significant extracardiac activity in this area, suspect this is artifact. Low risk study. Change compared to prior study, but patient has had cardiac bypass surgery in the interim.  Event Monitor: 12/2021 Limited data due to only wearing for 2 days. Bradycardia present which could be causative of dizziness. Needs to follow up with cardiologist.    Patient had a min HR of 49 bpm, max HR of 120 bpm, and avg HR of 60 bpm. Predominant underlying rhythm was Sinus Rhythm. First Degree AV Block was present. Bundle Branch Block/IVCD was present. 3 Supraventricular Tachycardia runs occurred, the run with  the fastest interval lasting 4 beats with a max rate of 120 bpm, the longest lasting 4 beats with an avg rate of 107 bpm. Isolated SVEs were rare (<1.0%), SVE Couplets were rare (<1.0%), and no SVE  Triplets were present. Isolated VEs were rare (<1.0%),  and no VE Couplets or VE Triplets were present.   Physical Exam:   VS:  BP 118/68   Pulse 62   Ht 6' (1.829 m)   Wt 198 lb 12.8 oz (90.2 kg)   SpO2 94%   BMI 26.96 kg/m    Wt Readings from Last 3 Encounters:  08/31/22 198 lb 12.8 oz (90.2 kg)  08/24/22 190 lb (86.2 kg)  08/05/22 198 lb 9.6 oz (90.1 kg)     GEN: Pleasant male appearing in no acute  distress NECK: No JVD; No carotid bruits CARDIAC: RRR, no murmurs, rubs, gallops RESPIRATORY:  Clear to auscultation without rales, wheezing or rhonchi  ABDOMEN: Soft, non-tender, non-distended EXTREMITIES:  No pitting edema; No deformity   ASSESSMENT AND PLAN:    1. CAD/Chest Pain - He is s/p CABG in 11/2017 and most recent ischemic evaluation was a low-risk NST in 01/2021. Troponin values were negative during his recent ED evaluation but he does report an episode of chest pain in the interim which occurred with exertion. We reviewed the recent Chest CT showed coronary calcification due to plaque in his native arteries. Reviewed ischemic evaluation options with the patient and will plan for a follow-up Lexiscan Myoview to rule out significant ischemia. - Continue low-dose Atenolol and Praluent. Previously intolerant to statins and he also reports prior intolerances to ASA as well but no anaphylaxis (actually took ASA the day of his Emergency Dept visit and tolerated well).   2. Palpitations - He reports intermittent symptoms but says they spontaneously resolve. He does take 6.25 mg of Atenolol daily and reports he has been intolerant to higher doses. Was also previously intolerant to Lopressor. Prior monitor last year showed only brief episodes of SVT and PAC's and PVC's with less than a 1% burden.  3. HLD - He has been intolerant to multiple statins and is on Praluent. LDL was at 26 in 08/2021. Will recheck an FLP at the time of his stress test.    Signed, Erma Heritage, PA-C

## 2022-09-02 ENCOUNTER — Ambulatory Visit (HOSPITAL_COMMUNITY)
Admission: RE | Admit: 2022-09-02 | Discharge: 2022-09-02 | Disposition: A | Discharge status: Home / Self Care | Payer: PPO | Source: Ambulatory Visit | Attending: Student | Admitting: Student

## 2022-09-02 ENCOUNTER — Ambulatory Visit (HOSPITAL_COMMUNITY)
Admission: RE | Admit: 2022-09-02 | Discharge: 2022-09-02 | Disposition: A | Discharge status: Home / Self Care | Payer: No Typology Code available for payment source | Source: Ambulatory Visit | Attending: Internal Medicine | Admitting: Internal Medicine

## 2022-09-02 DIAGNOSIS — R079 Chest pain, unspecified: Secondary | ICD-10-CM | POA: Insufficient documentation

## 2022-09-02 LAB — NM MYOCAR MULTI W/SPECT W/WALL MOTION / EF
Nuc Stress EF: 77 %
Peak HR: 85 {beats}/min
Rest HR: 60 {beats}/min
ST Depression (mm): 0 mm
TID: 0.92

## 2022-09-02 MED ORDER — REGADENOSON 0.4 MG/5ML IV SOLN
INTRAVENOUS | Status: AC
  Administered 2022-09-02: 0.4 mg via INTRAVENOUS
  Filled 2022-09-02: qty 5

## 2022-09-02 MED ORDER — TECHNETIUM TC 99M TETROFOSMIN IV KIT
10.0000 | PACK | Freq: Once | INTRAVENOUS | Status: AC | PRN
  Administered 2022-09-02: 11 via INTRAVENOUS

## 2022-09-02 MED ORDER — SODIUM CHLORIDE FLUSH 0.9 % IV SOLN
INTRAVENOUS | Status: AC
  Administered 2022-09-02: 10 mL via INTRAVENOUS
  Filled 2022-09-02: qty 10

## 2022-09-02 MED ORDER — TECHNETIUM TC 99M TETROFOSMIN IV KIT
30.0000 | PACK | Freq: Once | INTRAVENOUS | Status: AC | PRN
  Administered 2022-09-02: 32.5 via INTRAVENOUS

## 2022-09-29 ENCOUNTER — Ambulatory Visit (INDEPENDENT_AMBULATORY_CARE_PROVIDER_SITE_OTHER): Payer: PPO | Admitting: Family Medicine

## 2022-09-29 ENCOUNTER — Encounter: Payer: Self-pay | Admitting: Family Medicine

## 2022-09-29 VITALS — BP 154/75 | HR 56 | Temp 96.8°F | Ht 73.0 in | Wt 197.2 lb

## 2022-09-29 DIAGNOSIS — E782 Mixed hyperlipidemia: Secondary | ICD-10-CM | POA: Diagnosis not present

## 2022-09-29 DIAGNOSIS — N400 Enlarged prostate without lower urinary tract symptoms: Secondary | ICD-10-CM | POA: Diagnosis not present

## 2022-09-29 DIAGNOSIS — E559 Vitamin D deficiency, unspecified: Secondary | ICD-10-CM

## 2022-09-29 DIAGNOSIS — E876 Hypokalemia: Secondary | ICD-10-CM

## 2022-09-29 DIAGNOSIS — L989 Disorder of the skin and subcutaneous tissue, unspecified: Secondary | ICD-10-CM | POA: Diagnosis not present

## 2022-09-29 DIAGNOSIS — E538 Deficiency of other specified B group vitamins: Secondary | ICD-10-CM
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
## 2022-09-30 LAB — CBC WITH DIFFERENTIAL/PLATELET
Basophils Absolute: 0.1 10*3/uL (ref 0.0–0.2)
Basos: 1 %
EOS (ABSOLUTE): 0.1 10*3/uL (ref 0.0–0.4)
Eos: 3 %
Hematocrit: 47.7 % (ref 37.5–51.0)
Hemoglobin: 16.3 g/dL (ref 13.0–17.7)
Immature Grans (Abs): 0 10*3/uL (ref 0.0–0.1)
Immature Granulocytes: 0 %
Lymphocytes Absolute: 1.2 10*3/uL (ref 0.7–3.1)
Lymphs: 24 %
MCH: 32.5 pg (ref 26.6–33.0)
MCHC: 34.2 g/dL (ref 31.5–35.7)
MCV: 95 fL (ref 79–97)
Monocytes Absolute: 0.7 10*3/uL (ref 0.1–0.9)
Monocytes: 14 %
Neutrophils Absolute: 3.1 10*3/uL (ref 1.4–7.0)
Neutrophils: 58 %
Platelets: 131 10*3/uL — ABNORMAL LOW (ref 150–450)
RBC: 5.02 x10E6/uL (ref 4.14–5.80)
RDW: 13.4 % (ref 11.6–15.4)
WBC: 5.2 10*3/uL (ref 3.4–10.8)

## 2022-09-30 LAB — PSA, TOTAL AND FREE
PSA, Free Pct: 36.7 %
PSA, Free: 0.11 ng/mL
Prostate Specific Ag, Serum: 0.3 ng/mL (ref 0.0–4.0)

## 2022-09-30 LAB — CMP14+EGFR
ALT: 21 IU/L (ref 0–44)
AST: 21 IU/L (ref 0–40)
Albumin/Globulin Ratio: 2.1 (ref 1.2–2.2)
Albumin: 4.2 g/dL (ref 3.7–4.7)
Alkaline Phosphatase: 55 IU/L (ref 44–121)
BUN/Creatinine Ratio: 12 (ref 10–24)
BUN: 16 mg/dL (ref 8–27)
Bilirubin Total: 0.7 mg/dL (ref 0.0–1.2)
CO2: 21 mmol/L (ref 20–29)
Calcium: 9.3 mg/dL (ref 8.6–10.2)
Chloride: 104 mmol/L (ref 96–106)
Creatinine, Ser: 1.3 mg/dL — ABNORMAL HIGH (ref 0.76–1.27)
Globulin, Total: 2 g/dL (ref 1.5–4.5)
Glucose: 92 mg/dL (ref 70–99)
Potassium: 4.5 mmol/L (ref 3.5–5.2)
Sodium: 139 mmol/L (ref 134–144)
Total Protein: 6.2 g/dL (ref 6.0–8.5)
eGFR: 54 mL/min/{1.73_m2} — ABNORMAL LOW (ref >59)

## 2022-09-30 LAB — LIPID PANEL
Chol/HDL Ratio: 2.6 ratio (ref 0.0–5.0)
Cholesterol, Total: 97 mg/dL — ABNORMAL LOW (ref 100–199)
HDL: 38 mg/dL — ABNORMAL LOW (ref >39)
LDL Chol Calc (NIH): 32 mg/dL (ref 0–99)
Triglycerides: 159 mg/dL — ABNORMAL HIGH (ref 0–149)
VLDL Cholesterol Cal: 27 mg/dL (ref 5–40)

## 2022-09-30 LAB — THYROID PANEL WITH TSH
Free Thyroxine Index: 2.3 (ref 1.2–4.9)
T3 Uptake Ratio: 30 % (ref 24–39)
T4, Total: 7.8 ug/dL (ref 4.5–12.0)
TSH: 3.06 u[IU]/mL (ref 0.450–4.500)

## 2022-09-30 LAB — VITAMIN B12: Vitamin B-12: 438 pg/mL (ref 232–1245)

## 2022-09-30 LAB — VITAMIN D 25 HYDROXY (VIT D DEFICIENCY, FRACTURES): Vit D, 25-Hydroxy: 54.3 ng/mL (ref 30.0–100.0)

## 2022-10-17 ENCOUNTER — Other Ambulatory Visit: Payer: Self-pay | Admitting: Family Medicine

## 2022-10-17 DIAGNOSIS — M79661 Pain in right lower leg: Secondary | ICD-10-CM

## 2022-10-18 DIAGNOSIS — L03115 Cellulitis of right lower limb: Secondary | ICD-10-CM | POA: Diagnosis not present

## 2022-10-18 DIAGNOSIS — S80861A Insect bite (nonvenomous), right lower leg, initial encounter: Secondary | ICD-10-CM | POA: Diagnosis not present

## 2022-10-18 NOTE — Telephone Encounter (Signed)
Last office visit 09/29/22 Last refill 06/03/22 30 grams, no refills [facility] patient

## 2022-10-19 ENCOUNTER — Ambulatory Visit: Payer: PPO | Admitting: Nurse Practitioner

## 2022-10-20 DIAGNOSIS — D1801 Hemangioma of skin and subcutaneous tissue: Secondary | ICD-10-CM | POA: Diagnosis not present

## 2022-10-20 DIAGNOSIS — D485 Neoplasm of uncertain behavior of skin: Secondary | ICD-10-CM | POA: Diagnosis not present

## 2022-10-20 DIAGNOSIS — L739 Follicular disorder, unspecified: Secondary | ICD-10-CM | POA: Diagnosis not present

## 2022-10-20 DIAGNOSIS — C44301 Unspecified malignant neoplasm of skin of nose: Secondary | ICD-10-CM | POA: Diagnosis not present

## 2022-10-20 DIAGNOSIS — L821 Other seborrheic keratosis: Secondary | ICD-10-CM | POA: Diagnosis not present

## 2022-10-20 DIAGNOSIS — R202 Paresthesia of skin: Secondary | ICD-10-CM | POA: Diagnosis not present

## 2022-10-26 ENCOUNTER — Other Ambulatory Visit: Payer: Self-pay | Admitting: Family Medicine

## 2022-10-26 DIAGNOSIS — W57XXXD Bitten or stung by nonvenomous insect and other nonvenomous arthropods, subsequent encounter: Secondary | ICD-10-CM

## 2022-10-27 DIAGNOSIS — S70361D Insect bite (nonvenomous), right thigh, subsequent encounter: Secondary | ICD-10-CM | POA: Diagnosis not present

## 2022-10-28 ENCOUNTER — Encounter: Payer: Self-pay | Admitting: Family Medicine

## 2022-10-28 ENCOUNTER — Ambulatory Visit (INDEPENDENT_AMBULATORY_CARE_PROVIDER_SITE_OTHER): Payer: PPO | Admitting: Family Medicine

## 2022-10-28 VITALS — BP 119/60 | HR 60 | Temp 97.4°F | Ht 73.0 in | Wt 197.2 lb

## 2022-10-28 DIAGNOSIS — W57XXXA Bitten or stung by nonvenomous insect and other nonvenomous arthropods, initial encounter: Secondary | ICD-10-CM | POA: Diagnosis not present

## 2022-10-28 DIAGNOSIS — S80861A Insect bite (nonvenomous), right lower leg, initial encounter: Secondary | ICD-10-CM | POA: Diagnosis not present

## 2022-10-28 NOTE — Progress Notes (Signed)
   Acute Office Visit  Subjective:     Patient ID: Ronald Russell., male    DOB: December 29, 1934, 88 y.o.   MRN: [id number]  Chief Complaint  Patient presents with   Tick Removal    HPI Patient is in today for tick bites. He had a tick bite on his right leg 10 days ago. He was started on doxycyline BID x 10 days for this. He completed this today. He pulled another tick off of him 2 days ago. It is itchy and bothersome. He has applied heat with relief. He denies fever, neck pain, myalgias, HA, weakness, nausea, vomiting, confusion, numbness, or tingling.   ROS As per HPI.      Objective:    BP 119/60   Pulse 60   Temp (!) 97.4 F (36.3 C) (Temporal)   Ht 6\' 1"  (1.854 m)   Wt 197 lb 4 oz (89.5 kg)   SpO2 94%   BMI 26.02 kg/m    Physical Exam Vitals and nursing note reviewed.  Constitutional:      General: He is not in acute distress.    Appearance: Normal appearance. He is not ill-appearing, toxic-appearing or diaphoretic.  Pulmonary:     Effort: Pulmonary effort is normal. No respiratory distress.  Skin:    General: Skin is warm and dry.     Comments: Insect bite to right inguinal canal area. Mild erythema. No foreign bodies appreciated. No exudate. No rash with central clearing.   Neurological:     Mental Status: He is alert and oriented to person, place, and time. Mental status is at baseline.  Psychiatric:        Mood and Affect: Mood normal.        Behavior: Behavior normal.     No results found for any visits on 10/28/22.      Assessment & Plan:   Jarquise was seen today for tick removal.  Diagnoses and all orders for this visit:  Tick bite of multiple sites Completed doxycyline BID x 10 days today following first bite. Discussed this would provided prophylaxis for tick bite 2 days ago as well. No symptoms of tick borne illness today. Discussed symptomatic care and return precautions.   The patient indicates understanding of these issues and agrees with  the plan.   Gabriel Earing, FNP

## 2022-11-24 DIAGNOSIS — R21 Rash and other nonspecific skin eruption: Secondary | ICD-10-CM | POA: Diagnosis not present

## 2022-11-26 DIAGNOSIS — R21 Rash and other nonspecific skin eruption: Secondary | ICD-10-CM | POA: Diagnosis not present

## 2022-11-30 DIAGNOSIS — G8918 Other acute postprocedural pain: Secondary | ICD-10-CM | POA: Diagnosis not present

## 2022-12-01 DIAGNOSIS — Z85828 Personal history of other malignant neoplasm of skin: Secondary | ICD-10-CM | POA: Diagnosis not present

## 2022-12-01 DIAGNOSIS — Z09 Encounter for follow-up examination after completed treatment for conditions other than malignant neoplasm: Secondary | ICD-10-CM | POA: Diagnosis not present

## 2022-12-07 ENCOUNTER — Ambulatory Visit (INDEPENDENT_AMBULATORY_CARE_PROVIDER_SITE_OTHER): Payer: PPO

## 2022-12-07 VITALS — Ht 72.0 in | Wt 187.0 lb

## 2022-12-07 DIAGNOSIS — Z0000 Encounter for general adult medical examination without abnormal findings: Secondary | ICD-10-CM | POA: Diagnosis not present

## 2022-12-07 DIAGNOSIS — Z85828 Personal history of other malignant neoplasm of skin: Secondary | ICD-10-CM | POA: Diagnosis not present
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
## 2022-12-09 ENCOUNTER — Other Ambulatory Visit: Payer: Self-pay | Admitting: Family Medicine

## 2022-12-09 DIAGNOSIS — K219 Gastro-esophageal reflux disease without esophagitis: Secondary | ICD-10-CM
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
## 2022-12-14 ENCOUNTER — Ambulatory Visit: Payer: PPO | Attending: Internal Medicine | Admitting: Internal Medicine

## 2022-12-14 ENCOUNTER — Encounter: Payer: Self-pay | Admitting: Internal Medicine

## 2022-12-14 VITALS — BP 160/78 | HR 58 | Ht 72.0 in | Wt 187.0 lb

## 2022-12-14 DIAGNOSIS — I2510 Atherosclerotic heart disease of native coronary artery without angina pectoris: Secondary | ICD-10-CM | POA: Diagnosis not present

## 2022-12-14 NOTE — Patient Instructions (Signed)
Medication Instructions:  STOP Praluent   Labwork: None today  Testing/Procedures: None today  Follow-Up: 6 months  Any Other Special Instructions Will Be Listed Below (If Applicable).  If you need a refill on your cardiac medications before your next appointment, please call your pharmacy.

## 2023-01-17 ENCOUNTER — Telehealth: Payer: Self-pay | Admitting: Internal Medicine

## 2023-01-17 NOTE — Telephone Encounter (Signed)
Pt states he was told to c/b after stopping praulent

## 2023-01-18 NOTE — Telephone Encounter (Signed)
Patient states he stopped both praluent and atenolol at the same time on 12/14/22  He states he's topped BB for 28 days and then had skipped beats so he took 1/4 tablet of atenolol and reports he gets "swimmy headed" . His BP was 109/56, HR 63  He states now he is not sure the Praluent  was the cause.   Please advise.

## 2023-01-19 MED ORDER — PRALUENT 75 MG/ML ~~LOC~~ SOAJ: 75.0000 mg | SUBCUTANEOUS | Status: AC

## 2023-01-19 NOTE — Telephone Encounter (Signed)
Patient agrees to go back on Praluent.  He will let us know if he wants another BB, for now he wants to try using his atenolol.

## 2023-01-21 ENCOUNTER — Other Ambulatory Visit: Payer: Self-pay

## 2023-01-21 MED ORDER — ATENOLOL 25 MG PO TABS: ORAL_TABLET | ORAL | 1 refills | Status: DC

## 2023-01-25 ENCOUNTER — Telehealth: Payer: PPO

## 2023-01-25 DIAGNOSIS — J449 Chronic obstructive pulmonary disease, unspecified: Secondary | ICD-10-CM | POA: Diagnosis not present

## 2023-01-25 DIAGNOSIS — N1831 Chronic kidney disease, stage 3a: Secondary | ICD-10-CM | POA: Diagnosis not present

## 2023-01-25 DIAGNOSIS — I129 Hypertensive chronic kidney disease with stage 1 through stage 4 chronic kidney disease, or unspecified chronic kidney disease: Secondary | ICD-10-CM | POA: Diagnosis not present

## 2023-01-25 DIAGNOSIS — I2510 Atherosclerotic heart disease of native coronary artery without angina pectoris: Secondary | ICD-10-CM | POA: Diagnosis not present

## 2023-01-25 DIAGNOSIS — Z1331 Encounter for screening for depression: Secondary | ICD-10-CM | POA: Diagnosis not present

## 2023-01-25 DIAGNOSIS — Z66 Do not resuscitate: Secondary | ICD-10-CM | POA: Diagnosis not present

## 2023-01-25 DIAGNOSIS — Z8551 Personal history of malignant neoplasm of bladder: Secondary | ICD-10-CM | POA: Diagnosis not present

## 2023-01-25 DIAGNOSIS — D696 Thrombocytopenia, unspecified: Secondary | ICD-10-CM | POA: Diagnosis not present

## 2023-01-26 ENCOUNTER — Encounter: Payer: Self-pay | Admitting: Family Medicine

## 2023-01-26 ENCOUNTER — Ambulatory Visit: Payer: PPO | Admitting: Nurse Practitioner

## 2023-01-26 ENCOUNTER — Ambulatory Visit: Payer: PPO | Admitting: Family Medicine

## 2023-01-26 VITALS — BP 131/74 | HR 63 | Temp 98.1°F | Ht 72.0 in | Wt 194.0 lb

## 2023-01-26 DIAGNOSIS — W57XXXA Bitten or stung by nonvenomous insect and other nonvenomous arthropods, initial encounter: Secondary | ICD-10-CM

## 2023-01-26 DIAGNOSIS — S30860A Insect bite (nonvenomous) of lower back and pelvis, initial encounter: Secondary | ICD-10-CM | POA: Diagnosis not present

## 2023-01-26 DIAGNOSIS — R42 Dizziness and giddiness: Secondary | ICD-10-CM

## 2023-01-26 MED ORDER — MECLIZINE HCL 12.5 MG PO TABS: 6.2500 mg | ORAL_TABLET | Freq: Every day | ORAL | 0 refills | Status: DC | PRN

## 2023-01-26 MED ORDER — DOXYCYCLINE MONOHYDRATE 100 MG PO CAPS: 200.0000 mg | ORAL_CAPSULE | Freq: Once | ORAL | 0 refills | Status: AC

## 2023-01-26 NOTE — Progress Notes (Signed)
Acute Office Visit  Subjective:  Patient ID: Ronald Heinzmann., male    DOB: 1935/01/11, 88 y.o.   MRN: [id number]  Chief Complaint  Patient presents with   Tick Removal    Tick bite yesterday states he thinks tick bit him behind right knee and moved up and bit him in groin area this is where he removed tick.     HPI Patient is in today for tick bite that he found yesterday. Reports that he believes it bit him twice behind his right knee and moved up and bit him in the groin area. He pulled it off his scrotum. Reports that he has not been out of the house in the last two weeks, he has sat on the screened in porch. Reports that they have a stray cat that comes up with ticks on him. Does not know how long it was on before he found it. Reports fatigue on Tuesday. States that he had leftover amoxicillin from his ENT that he started last Friday. Took it twice daily from Friday to last night. Reports GI upset since taking amoxicillin   In addition, complains of being "swimmy headed" and nauseous in the mornings. Reports that he frequently experiences these symptoms when he sits up in bed and when he goes to stand. Recently tried ear drops but that made his ears burn and worsened his symptoms. States that he cannot take meclizine. Reports that he usually drinks sprite and tries to burp until his symptoms resolve.   ROS As per HPI  Objective:  BP 131/74   Pulse 63   Temp 98.1 F (36.7 C) (Temporal)   Ht 6' (1.829 m)   Wt 194 lb (88 kg)   SpO2 93%   BMI 26.31 kg/m   Physical Exam Constitutional:      General: He is awake. He is not in acute distress.    Appearance: Normal appearance. He is well-developed and well-groomed. He is not ill-appearing, toxic-appearing or diaphoretic.  Cardiovascular:     Rate and Rhythm: Normal rate and regular rhythm.     Pulses: Normal pulses.          Radial pulses are 2+ on the right side and 2+ on the left side.       Posterior tibial pulses are 2+ on  the right side and 2+ on the left side.     Heart sounds: Normal heart sounds. No murmur heard.    No gallop.  Pulmonary:     Effort: Pulmonary effort is normal. No respiratory distress.     Breath sounds: Normal breath sounds. No stridor. No wheezing, rhonchi or rales.  Musculoskeletal:     Cervical back: Full passive range of motion without pain and neck supple.     Right lower leg: No edema.     Left lower leg: No edema.  Skin:    General: Skin is warm.     Capillary Refill: Capillary refill takes less than 2 seconds.     Comments: Small erythematous lesion on back of right knee  Small erythematous lesion on left scrotum   Neurological:     General: No focal deficit present.     Mental Status: He is alert, oriented to person, place, and time and easily aroused. Mental status is at baseline.     GCS: GCS eye subscore is 4. GCS verbal subscore is 5. GCS motor subscore is 6.     Motor: No weakness.     Coordination:  Coordination abnormal.     Gait: Gait abnormal.  Psychiatric:        Attention and Perception: Attention and perception normal.        Mood and Affect: Mood and affect normal.        Speech: Speech normal.        Behavior: Behavior normal. Behavior is cooperative.        Thought Content: Thought content normal. Thought content does not include homicidal or suicidal ideation. Thought content does not include homicidal or suicidal plan.        Cognition and Memory: Cognition and memory normal.        Judgment: Judgment normal.       01/26/2023    3:39 PM 12/07/2022    1:51 PM 09/29/2022    1:57 PM  Depression screen PHQ 2/9  Decreased Interest 0 0 0  Down, Depressed, Hopeless 0 0 0  PHQ - 2 Score 0 0 0  Altered sleeping 0  0  Tired, decreased energy 0  0  Change in appetite 0  0  Feeling bad or failure about yourself  0  0  Trouble concentrating 0  0  Moving slowly or fidgety/restless 0  0  Suicidal thoughts 0  0  PHQ-9 Score 0  0  Difficult doing work/chores  Not difficult at all  Not difficult at all      01/26/2023    3:39 PM 09/29/2022    1:57 PM 08/05/2022    2:00 PM 02/09/2022    2:54 PM  GAD 7 : Generalized Anxiety Score  Nervous, Anxious, on Edge 0 0 0 0  Control/stop worrying 0 0 0 0  Worry too much - different things 0 0 0 0  Trouble relaxing 0 0 0 0  Restless 0 0 0 0  Easily annoyed or irritable 0 0 0 0  Afraid - awful might happen 0 0 0 0  Total GAD 7 Score 0 0 0 0  Anxiety Difficulty Not difficult at all Not difficult at all Not difficult at all Not difficult at all   Assessment & Plan:  1. Tick bite of pelvic region, initial encounter Labs as below. Will communicate results to patient once available. Will await results to determine next steps.  Will start empiric treatment as below with doxycyline. Reports that he tolerates capsules of doxycycline. Instructed patient to stop amoxicillin and to take abx with food.  - Lyme Disease Serology w/Reflex - Spotted Fever Group Antibodies - CMP14+EGFR - doxycycline (MONODOX) 100 MG capsule; Take 2 capsules (200 mg total) by mouth once for 1 dose.  Dispense: 2 capsule; Refill: 0  2. Dizziness Discussed with patient to follow up with ENT. Given patient age do not want to prescribed zofran or phenergan. Offered low-dose meclizine, patient declined.   The above assessment and management plan was discussed with the patient. The patient verbalized understanding of and has agreed to the management plan using shared-decision making. Patient is aware to call the clinic if they develop any new symptoms or if symptoms fail to improve or worsen. Patient is aware when to return to the clinic for a follow-up visit. Patient educated on when it is appropriate to go to the emergency department.   Return if symptoms worsen or fail to improve.  Neale Burly, DNP-FNP [facility] [address] [place], [place] [address] [phone]

## 2023-01-27 ENCOUNTER — Telehealth: Payer: Self-pay | Admitting: Family Medicine

## 2023-01-27 ENCOUNTER — Other Ambulatory Visit: Payer: Self-pay | Admitting: Family Medicine

## 2023-01-27 DIAGNOSIS — W57XXXA Bitten or stung by nonvenomous insect and other nonvenomous arthropods, initial encounter: Secondary | ICD-10-CM

## 2023-01-27 MED ORDER — DOXYCYCLINE MONOHYDRATE 100 MG PO CAPS: 200.0000 mg | ORAL_CAPSULE | Freq: Once | ORAL | 0 refills | Status: AC

## 2023-01-27 NOTE — Telephone Encounter (Signed)
When the pt called this am he mention the "drug store" did not have his rx and I thought he meant we needed to send the abx to THE DRUG [facility] in [place]. I clarified to the pt and apologized for the misunderstanding/ please send the rx to [facility] in [place].

## 2023-01-27 NOTE — Telephone Encounter (Signed)
Pt called back to see if provider got his message. Pt says he does want his Rx to be sent to [facility] [place] but says he spoke with someone there twice and both times he was told that they have not received his Doxycycline Rx.

## 2023-02-06 LAB — SPOTTED FEVER GROUP ANTIBODIES
Spotted Fever Group IgG: <1:64 {titer}
Spotted Fever Group IgM: <1:64 {titer}

## 2023-02-06 LAB — CMP14+EGFR
ALT: 16 IU/L (ref 0–44)
AST: 16 IU/L (ref 0–40)
Albumin: 4.2 g/dL (ref 3.7–4.7)
Alkaline Phosphatase: 61 IU/L (ref 44–121)
BUN/Creatinine Ratio: 12 (ref 10–24)
BUN: 16 mg/dL (ref 8–27)
Bilirubin Total: 0.6 mg/dL (ref 0.0–1.2)
CO2: 23 mmol/L (ref 20–29)
Calcium: 9.6 mg/dL (ref 8.6–10.2)
Chloride: 103 mmol/L (ref 96–106)
Creatinine, Ser: 1.3 mg/dL — ABNORMAL HIGH (ref 0.76–1.27)
Globulin, Total: 2.3 g/dL (ref 1.5–4.5)
Glucose: 101 mg/dL — ABNORMAL HIGH (ref 70–99)
Potassium: 4.3 mmol/L (ref 3.5–5.2)
Sodium: 141 mmol/L (ref 134–144)
Total Protein: 6.5 g/dL (ref 6.0–8.5)
eGFR: 54 mL/min/{1.73_m2} — ABNORMAL LOW (ref >59)

## 2023-02-06 LAB — LYME DISEASE SEROLOGY W/REFLEX: Lyme Total Antibody EIA: NEGATIVE

## 2023-02-07 ENCOUNTER — Telehealth: Payer: Self-pay | Admitting: Internal Medicine

## 2023-02-07 NOTE — Telephone Encounter (Signed)
Called patient back about message. Informed patient that our PharmD stated it is okay to take. Patient verbalized understanding.

## 2023-02-07 NOTE — Telephone Encounter (Signed)
  Pt c/o medication issue:  1. Name of Medication: small dose of prednisone   2. How are you currently taking this medication (dosage and times per day)?   3. Are you having a reaction (difficulty breathing--STAT)?   4. What is your medication issue? Pt said, he was having trouble with his ear and felt like his head is bouncing. He saw his ENT doctor and wants him to take prednisone. He wants to ask Dr. Tenny Craw if he is ok to take small dose of  prednisone

## 2023-02-07 NOTE — Telephone Encounter (Signed)
Yes this is ok 

## 2023-02-08 NOTE — Progress Notes (Signed)
Negative Lyme and RMSF panels. CBC stable. Recommend patient follow up if symptoms continue or do not improve.

## 2023-03-04 ENCOUNTER — Other Ambulatory Visit: Payer: Self-pay | Admitting: Family Medicine

## 2023-03-04 DIAGNOSIS — M79661 Pain in right lower leg: Secondary | ICD-10-CM

## 2023-03-10 ENCOUNTER — Ambulatory Visit (INDEPENDENT_AMBULATORY_CARE_PROVIDER_SITE_OTHER): Payer: PPO | Admitting: Nurse Practitioner

## 2023-03-10 ENCOUNTER — Encounter: Payer: Self-pay | Admitting: Nurse Practitioner

## 2023-03-10 VITALS — BP 115/64 | HR 66 | Temp 97.5°F | Ht 72.0 in | Wt 194.4 lb

## 2023-03-10 DIAGNOSIS — R1110 Vomiting, unspecified: Secondary | ICD-10-CM | POA: Diagnosis not present

## 2023-03-10 DIAGNOSIS — K529 Noninfective gastroenteritis and colitis, unspecified: Secondary | ICD-10-CM | POA: Insufficient documentation

## 2023-03-10 DIAGNOSIS — R197 Diarrhea, unspecified: Secondary | ICD-10-CM | POA: Diagnosis not present

## 2023-03-10 DIAGNOSIS — J069 Acute upper respiratory infection, unspecified: Secondary | ICD-10-CM | POA: Diagnosis not present

## 2023-03-10 LAB — VERITOR FLU A/B WAIVED
Influenza A: NEGATIVE
Influenza B: NEGATIVE

## 2023-03-10 NOTE — Progress Notes (Signed)
Acute Office Visit  Subjective:     Patient ID: Ronald Nolton., male    DOB: 02-01-1935, 88 y.o.   MRN: [id number]  Chief Complaint  Patient presents with   Emesis    Started throwing up last night and diarrhea and feeling bad yesterday.    Diarrhea    HPI Ronald Lamas Sr. Is a 88 yrs old male Ronald Russell Vet present with wife for concerns for URI/GI symptoms. He was exposed to COVID two weeks ago and has started experiencing diarrhea since last last Diarrhea: Patient complains of diarrhea. Onset of diarrhea was yesterday. Diarrhea is occurring approximately 1 times per day and has improved without any medication. Patient describes diarrhea as semisolid. Diarrhea has been associated with  no other symptoms .  Patient denies blood in stool, fever, recent travel, significant abdominal pain, unintentional weight loss.  Previous visits for diarrhea: none. Evaluation to date: none. Treatment to date: None. He is eating and drinking as normal Flu negative COVID awaiting results  Review of Systems  Constitutional:  Negative for chills, fever and malaise/fatigue.  HENT:  Negative for congestion.   Eyes:  Negative for pain.  Respiratory:  Negative for cough and shortness of breath.   Cardiovascular:  Negative for chest pain and leg swelling.  Gastrointestinal:  Positive for diarrhea and nausea.  Genitourinary:  Negative for frequency and urgency.  Musculoskeletal:  Negative for falls and myalgias.  Neurological:  Negative for dizziness and headaches.  Endo/Heme/Allergies:  Negative for environmental allergies and polydipsia. Does not bruise/bleed easily.  Psychiatric/Behavioral:  Negative for hallucinations and suicidal ideas.    Negative unless indicated in HPI    Objective:    BP 115/64   Pulse 66   Temp (!) 97.5 F (36.4 C) (Temporal)   Ht 6' (1.829 m)   Wt 194 lb 6.4 oz (88.2 kg)   SpO2 96%   BMI 26.37 kg/m  BP Readings from Last 3 Encounters:  03/10/23 115/64  01/26/23  131/74  12/14/22 (!) 160/78   Wt Readings from Last 3 Encounters:  03/10/23 194 lb 6.4 oz (88.2 kg)  01/26/23 194 lb (88 kg)  12/14/22 187 lb (84.8 kg)      Physical Exam Vitals reviewed.  Constitutional:      General: He is not in acute distress.    Appearance: Normal appearance. He is not ill-appearing.  HENT:     Head: Normocephalic and atraumatic.  Eyes:     General: No scleral icterus.    Extraocular Movements: Extraocular movements intact.     Conjunctiva/sclera: Conjunctivae normal.     Pupils: Pupils are equal, round, and reactive to light.  Cardiovascular:     Rate and Rhythm: Normal rate and regular rhythm.  Pulmonary:     Effort: Pulmonary effort is normal.     Breath sounds: Normal breath sounds.  Abdominal:     General: Bowel sounds are normal.     Palpations: Abdomen is soft. There is no mass.     Tenderness: There is no abdominal tenderness.  Musculoskeletal:        General: Normal range of motion.     Right lower leg: No edema.     Left lower leg: No edema.  Skin:    General: Skin is warm and dry.     Coloration: Skin is not jaundiced.     Findings: No rash.  Neurological:     Mental Status: He is alert and oriented to person, place,  and time. Mental status is at baseline.  Psychiatric:        Mood and Affect: Mood normal.        Behavior: Behavior normal.        Thought Content: Thought content normal.        Judgment: Judgment normal.    Results for orders placed or performed in visit on 03/10/23  Veritor Flu A/B Waived  Result Value Ref Range   Influenza A Negative Negative   Influenza B Negative Negative        Assessment & Plan:  Diarrhea, unspecified type -     Novel Coronavirus, NAA ([facility]) -     Veritor Flu A/B Waived  Vomiting, unspecified vomiting type, unspecified whether nausea present -     Novel Coronavirus, NAA ([facility]) -     Veritor Flu A/B Waived  Viral URI   Ronald Russell was seen today for diarrhea, no acute  distress Flu negative Awaiting COVID results Increase hydration, rest OTC Imodium for diarrhea   encourage healthy lifestyle choices, including diet (rich in fruits, vegetables, and lean proteins, and low in salt and simple carbohydrates) and exercise (at least 30 minutes of moderate physical activity daily).     The above assessment and management plan was discussed with the patient. The patient verbalized understanding of and has agreed to the management plan. Patient is aware to call the clinic if they develop any new symptoms or if symptoms persist or worsen. Patient is aware when to return to the clinic for a follow-up visit. Patient educated on when it is appropriate to go to the emergency department.  Return if symptoms worsen or fail to improve.  Ronald Aran Santa Lighter, DNP [facility] [address] [place], [place] [address] [phone]

## 2023-03-11 DIAGNOSIS — R509 Fever, unspecified: Secondary | ICD-10-CM | POA: Diagnosis not present

## 2023-03-11 LAB — NOVEL CORONAVIRUS, NAA: SARS-CoV-2, NAA: NOT DETECTED

## 2023-03-15 DIAGNOSIS — Z87898 Personal history of other specified conditions: Secondary | ICD-10-CM | POA: Diagnosis not present

## 2023-03-21 ENCOUNTER — Ambulatory Visit (INDEPENDENT_AMBULATORY_CARE_PROVIDER_SITE_OTHER): Payer: PPO | Admitting: Family Medicine

## 2023-03-21 ENCOUNTER — Encounter: Payer: Self-pay | Admitting: Family Medicine

## 2023-03-21 VITALS — BP 137/71 | HR 65 | Ht 72.0 in | Wt 195.0 lb

## 2023-03-21 DIAGNOSIS — H8303 Labyrinthitis, bilateral: Secondary | ICD-10-CM

## 2023-03-21 DIAGNOSIS — R42 Dizziness and giddiness: Secondary | ICD-10-CM | POA: Diagnosis not present

## 2023-03-21 DIAGNOSIS — K219 Gastro-esophageal reflux disease without esophagitis: Secondary | ICD-10-CM | POA: Diagnosis not present

## 2023-03-21 MED ORDER — FAMOTIDINE 20 MG PO TABS
20.0000 mg | ORAL_TABLET | Freq: Two times a day (BID) | ORAL | 1 refills | Status: DC
Start: 2023-03-21 — End: 2023-04-12

## 2023-03-21 MED ORDER — DEXAMETHASONE 2 MG PO TABS
ORAL_TABLET | ORAL | 0 refills | Status: DC
Start: 2023-03-21 — End: 2023-04-14

## 2023-03-21 NOTE — Progress Notes (Signed)
BP 137/71   Pulse 65   Ht 6' (1.829 m)   Wt 195 lb (88.5 kg)   SpO2 95%   BMI 26.45 kg/m    Subjective:   Patient ID: Ronald Russell Sr., male    DOB: [birth year], 88 y.o.   MRN: [id number]  HPI: Ronald Summersett. is a 88 y.o. male presenting on 03/21/2023 for Dizziness and Nausea   HPI Patient is coming in today complaining of dizziness and nausea and indigestion.  He subsequently with this off and on for at least a few months if not longer.  He is seeing ENT and was treated with some doxycycline in July and then treated again with some doxycycline in July and it would get better when he is on the antibiotic and then come right back.  He was treated with some prednisone that did help but then ever since the prednisone he has been having some indigestion and nausea and early satiety and feeling sick on his stomach.  He says these have come and gone but are just not completely clearing.  He denies any fevers or chills.  He does have some upper abdominal discomfort associated with the nausea and then he just feels like he gets swimmy headed or feels off balance like his head is going 1 way or backwards or to the side.  He has had 1 fall recently backwards onto his bottom from feeling dizzy headed.  He has seen ENT and gastroenterology for the similar issues in the past.  He has recently seen ENT but not gastroenterology recently.  Relevant past medical, surgical, family and social history reviewed and updated as indicated. Interim medical history since our last visit reviewed. Allergies and medications reviewed and updated.  Review of Systems  Constitutional:  Negative for chills and fever.  HENT:  Negative for congestion.   Eyes:  Negative for discharge.  Respiratory:  Negative for cough, shortness of breath and wheezing.   Cardiovascular:  Negative for chest pain and leg swelling.  Gastrointestinal:  Positive for abdominal pain and nausea. Negative for abdominal distention, constipation,  diarrhea and vomiting.  Musculoskeletal:  Negative for back pain and gait problem.  Skin:  Negative for rash.  Neurological:  Positive for dizziness. Negative for weakness, light-headedness and headaches.  All other systems reviewed and are negative.   Per HPI unless specifically indicated above   Allergies as of 03/21/2023       Reactions   Uloric [febuxostat] Palpitations, Other (See Comments), Hypertension   Hypertension with palpitations and a sense of fuzziness and dizziness at the left side of his head   Cholestyramine Nausea Only   Doxazosin Diarrhea   Doxycycline Diarrhea   Omeprazole Other (See Comments)   Pantoprazole Other (See Comments)   Can tolerate 20mg    Pravastatin Other (See Comments)   Rosuvastatin Other (See Comments)   Augmentin [amoxicillin-pot Clavulanate] Diarrhea   Ciprofloxacin Diarrhea   Codeine Nausea And Vomiting, Other (See Comments)   Severe headache   Levaquin [levofloxacin] Diarrhea   Oxycodone Nausea And Vomiting   Tramadol Nausea And Vomiting   Vicodin [hydrocodone-acetaminophen] Nausea And Vomiting        Medication List        Accurate as of March 21, 2023  3:24 PM. If you have any questions, ask your nurse or doctor.          acetaminophen 500 MG tablet Commonly known as: TYLENOL Take 500 mg by mouth as needed.  atenolol 25 MG tablet Commonly known as: TENORMIN Take 1/4 tablet by mouth daily, may take additional 1/4  tablet daily as needed for palpitations   clindamycin 1 % external solution Commonly known as: CLEOCIN T   dexamethasone 2 MG tablet Commonly known as: DECADRON Take 4 tablets for 3 days then 3 tablets for 3 days then 2 tablets for 3 days then 1 tablet for 3 days Started by: Elige Radon Mykel Mohl   famotidine 20 MG tablet Commonly known as: PEPCID Take 1 tablet (20 mg total) by mouth 2 (two) times daily. Started by: Elige Radon Sencere Symonette   fluocinonide cream 0.05 % Commonly known as: LIDEX APPLY TO  AFFECTED AREA TWICE A DAY   hydrocortisone 25 MG suppository Commonly known as: ANUSOL-HC PLACE 1 SUPPOSITORY RECTALLY 2 TIMES DAILY.   lansoprazole 15 MG capsule Commonly known as: PREVACID TAKE 1 CAPSULE (15 MG TOTAL) BY MOUTH DAILY AT 12 NOON.   Potassium 99 MG Tabs Take 1 tablet by mouth every other day.   Praluent 75 MG/ML Soaj Generic drug: Alirocumab Inject 1 mL (75 mg total) into the skin every 14 (fourteen) days.   triamcinolone cream 0.1 % Commonly known as: KENALOG APPLY 1 APPLICATION TOPICALLY TWICE A DAY   VITAMIN D PO Take 1 tablet by mouth daily.         Objective:   BP 137/71   Pulse 65   Ht 6' (1.829 m)   Wt 195 lb (88.5 kg)   SpO2 95%   BMI 26.45 kg/m   Wt Readings from Last 3 Encounters:  03/21/23 195 lb (88.5 kg)  03/10/23 194 lb 6.4 oz (88.2 kg)  01/26/23 194 lb (88 kg)    Physical Exam Vitals and nursing note reviewed.  Constitutional:      General: He is not in acute distress.    Appearance: He is well-developed. He is not diaphoretic.  HENT:     Right Ear: Tympanic membrane and ear canal normal.     Left Ear: Tympanic membrane and ear canal normal.     Mouth/Throat:     Mouth: Mucous membranes are moist.     Pharynx: Oropharynx is clear. No oropharyngeal exudate or posterior oropharyngeal erythema.  Eyes:     General: No scleral icterus.    Conjunctiva/sclera: Conjunctivae normal.  Neck:     Thyroid: No thyromegaly.  Cardiovascular:     Rate and Rhythm: Normal rate and regular rhythm.     Heart sounds: Normal heart sounds. No murmur heard. Pulmonary:     Effort: Pulmonary effort is normal. No respiratory distress.     Breath sounds: Normal breath sounds. No wheezing.  Abdominal:     General: Abdomen is flat. Bowel sounds are normal. There is no distension.     Palpations: Abdomen is soft.     Tenderness: There is no abdominal tenderness. There is no guarding or rebound.     Hernia: No hernia is present.  Musculoskeletal:         General: Normal range of motion.     Cervical back: Neck supple.  Lymphadenopathy:     Cervical: No cervical adenopathy.  Skin:    General: Skin is warm and dry.     Findings: No rash.  Neurological:     Mental Status: He is alert and oriented to person, place, and time.     Coordination: Coordination normal.  Psychiatric:        Behavior: Behavior normal.  Assessment & Plan:   Problem List Items Addressed This Visit       Other   Dizziness - Primary   Relevant Orders   CBC with Differential/Platelet   CMP14+EGFR   TSH   Other Visit Diagnoses     Gastroesophageal reflux disease without esophagitis       Relevant Medications   famotidine (PEPCID) 20 MG tablet   Other Relevant Orders   CBC with Differential/Platelet   Labyrinthitis of both ears       Relevant Medications   dexamethasone (DECADRON) 2 MG tablet   Other Relevant Orders   CBC with Differential/Platelet   CMP14+EGFR   TSH     Will treat indigestion by adding Pepcid for a little while to help calm things down, continue with lansoprazole.  Will do short course of dexamethasone which should help with congestion and dizziness, if not follow-up with ENT  Follow up plan: Return if symptoms worsen or fail to improve.  Counseling provided for all of the vaccine components Orders Placed This Encounter  Procedures   CBC with Differential/Platelet   CMP14+EGFR   TSH    Arville Care, MD [facility] 03/21/2023, 3:24 PM

## 2023-03-22 LAB — CMP14+EGFR
ALT: 13 [IU]/L (ref 0–44)
AST: 19 [IU]/L (ref 0–40)
Albumin: 4.2 g/dL (ref 3.7–4.7)
Alkaline Phosphatase: 62 [IU]/L (ref 44–121)
BUN/Creatinine Ratio: 12 (ref 10–24)
BUN: 14 mg/dL (ref 8–27)
Bilirubin Total: 0.7 mg/dL (ref 0.0–1.2)
CO2: 18 mmol/L — ABNORMAL LOW (ref 20–29)
Calcium: 9.3 mg/dL (ref 8.6–10.2)
Chloride: 106 mmol/L (ref 96–106)
Creatinine, Ser: 1.21 mg/dL (ref 0.76–1.27)
Globulin, Total: 2 g/dL (ref 1.5–4.5)
Glucose: 97 mg/dL (ref 70–99)
Potassium: 4.1 mmol/L (ref 3.5–5.2)
Sodium: 140 mmol/L (ref 134–144)
Total Protein: 6.2 g/dL (ref 6.0–8.5)
eGFR: 58 mL/min/{1.73_m2} — ABNORMAL LOW (ref >59)

## 2023-03-22 LAB — CBC WITH DIFFERENTIAL/PLATELET
Basophils Absolute: 0.1 10*3/uL (ref 0.0–0.2)
Basos: 1 %
EOS (ABSOLUTE): 0.1 10*3/uL (ref 0.0–0.4)
Eos: 2 %
Hematocrit: 48.3 % (ref 37.5–51.0)
Hemoglobin: 16.5 g/dL (ref 13.0–17.7)
Immature Grans (Abs): 0 10*3/uL (ref 0.0–0.1)
Immature Granulocytes: 0 %
Lymphocytes Absolute: 1.1 10*3/uL (ref 0.7–3.1)
Lymphs: 19 %
MCH: 32.5 pg (ref 26.6–33.0)
MCHC: 34.2 g/dL (ref 31.5–35.7)
MCV: 95 fL (ref 79–97)
Monocytes Absolute: 0.6 10*3/uL (ref 0.1–0.9)
Monocytes: 10 %
Neutrophils Absolute: 3.9 10*3/uL (ref 1.4–7.0)
Neutrophils: 68 %
Platelets: 129 10*3/uL — ABNORMAL LOW (ref 150–450)
RBC: 5.08 x10E6/uL (ref 4.14–5.80)
RDW: 13.4 % (ref 11.6–15.4)
WBC: 5.8 10*3/uL (ref 3.4–10.8)

## 2023-03-22 LAB — TSH: TSH: 3.78 u[IU]/mL (ref 0.450–4.500)

## 2023-04-12 ENCOUNTER — Other Ambulatory Visit: Payer: Self-pay | Admitting: Family Medicine

## 2023-04-12 DIAGNOSIS — K219 Gastro-esophageal reflux disease without esophagitis: Secondary | ICD-10-CM

## 2023-04-14 ENCOUNTER — Encounter: Payer: Self-pay | Admitting: Family Medicine

## 2023-04-14 ENCOUNTER — Ambulatory Visit: Payer: PPO | Admitting: Family Medicine

## 2023-04-14 ENCOUNTER — Telehealth: Payer: Self-pay | Admitting: Family Medicine

## 2023-04-14 VITALS — BP 148/75 | HR 60 | Temp 97.9°F | Ht 73.0 in | Wt 194.6 lb

## 2023-04-14 DIAGNOSIS — R42 Dizziness and giddiness: Secondary | ICD-10-CM | POA: Diagnosis not present

## 2023-04-14 MED ORDER — DEXAMETHASONE SODIUM PHOSPHATE 4 MG/ML IJ SOLN
4.0000 mg | Freq: Once | INTRAMUSCULAR | Status: AC
Start: 2023-04-14 — End: 2023-04-14
  Administered 2023-04-14: 4 mg via INTRAMUSCULAR

## 2023-04-14 NOTE — Telephone Encounter (Signed)
I am glad to hear and I have a message out to the Infectious Disease provider, waiting for a return call.

## 2023-04-14 NOTE — Progress Notes (Signed)
Subjective: CC: "swimmy headed" PCP: [name], FNP  ZOX:WRUEAV C Ronald Russell. is a 88 y.o. male presenting to clinic today for:  Dizziness Patient presents today for a 2-day history of worsening dizziness. Patient reports he was feeling well Saturday and spent most of the day outside cutting wood. He felt well again Sunday, somewhat worse on Monday, and then woke up Tuesday feeling very dizzy. He mainly notices the dizziness with changes in head position such as lying down or sitting up. He has a history of dizziness since having RMSF in May 2023. The dizziness only completely resolves with antibiotics or steroids, which he is requesting today. He has been evaluated by ENT multiple times, and the extensive work-up was largely negative. He also reports fullness in his left ear, congestion, and headaches that started around the same time his dizziness flared. His ear does not necessarily hurt, but his hearing seems muffled. He denies a history of seasonal allergies or recent fever and chills. He does feel off balance when the dizziness is at its worst. He fell about 6 weeks ago but has not recently fallen more then that one time. He denies any injuries from the fall.  Relevant past medical, surgical, family, and social history reviewed and updated as indicated.  Allergies and medications reviewed and updated.  Allergies  Allergen Reactions   Uloric [Febuxostat] Palpitations, Other (See Comments) and Hypertension    Hypertension with palpitations and a sense of fuzziness and dizziness at the left side of his head   Cholestyramine Nausea Only   Doxazosin Diarrhea   Doxycycline Diarrhea   Omeprazole Other (See Comments)   Pantoprazole Other (See Comments)    Can tolerate 20mg    Pravastatin Other (See Comments)   Rosuvastatin Other (See Comments)   Augmentin [Amoxicillin-Pot Clavulanate] Diarrhea   Ciprofloxacin Diarrhea   Codeine Nausea And Vomiting and Other (See Comments)    Severe  headache   Levaquin [Levofloxacin] Diarrhea   Oxycodone Nausea And Vomiting   Tramadol Nausea And Vomiting   Vicodin [Hydrocodone-Acetaminophen] Nausea And Vomiting   Past Medical History:  Diagnosis Date   Allergy    Anxiety    Bladder cancer (HCC) 2010   Tx with BCG   CAD (coronary artery disease)    LHC 6/19: pLAD 75, mLAD 100, D2 75; pLCx 80, mLCx 70, EF 55-65 >> s/p CABG // Echo 3/18: EF 60-65, Gr 2 DD   Chronic bronchitis (HCC)    "yearly the last 3 yrs" (02/13/2013)   Dysrhythmia    GERD (gastroesophageal reflux disease)    History of blood transfusion 1949   "seeral" (02/13/2013)   HOH (hard of hearing)    Hypertension    Ocular migraine    "not often" (02/13/2013)   Pneumonia    "last time ~ 2 yr ago; had it before that too" (02/13/2013)   Seasonal allergies    Shingles    has neuropathy since it happened in 6/17   Temporal arteritis (HCC)    Thrombocytopenia (HCC) 11/29/2016    Current Outpatient Medications:    acetaminophen (TYLENOL) 500 MG tablet, Take 500 mg by mouth as needed., Disp: , Rfl:    Alirocumab (PRALUENT) 75 MG/ML SOAJ, Inject 1 mL (75 mg total) into the skin every 14 (fourteen) days., Disp: , Rfl:    atenolol (TENORMIN) 25 MG tablet, Take 1/4 tablet by mouth daily, may take additional 1/4  tablet daily as needed for palpitations, Disp: 30 tablet, Rfl: 1  famotidine (PEPCID) 20 MG tablet, TAKE 1 TABLET BY MOUTH TWICE A DAY, Disp: 180 tablet, Rfl: 1   fluocinonide cream (LIDEX) 0.05 %, APPLY TO AFFECTED AREA TWICE A DAY, Disp: 30 g, Rfl: 1   hydrocortisone (ANUSOL-HC) 25 MG suppository, PLACE 1 SUPPOSITORY RECTALLY 2 TIMES DAILY., Disp: 12 suppository, Rfl: 0   lansoprazole (PREVACID) 15 MG capsule, TAKE 1 CAPSULE (15 MG TOTAL) BY MOUTH DAILY AT 12 NOON., Disp: 90 capsule, Rfl: 2   Potassium 99 MG TABS, Take 1 tablet by mouth every other day., Disp: , Rfl:    triamcinolone cream (KENALOG) 0.1 %, APPLY 1 APPLICATION TOPICALLY TWICE A DAY, Disp: 30 g, Rfl: 0    VITAMIN D PO, Take 1 tablet by mouth daily., Disp: , Rfl:  Social History   Socioeconomic History   Marital status: Married    Spouse name: Ann   Number of children: 4   Years of education: GED   Highest education level: GED or equivalent  Occupational History   Occupation: Retired    Comment: Security  Tobacco Use   Smoking status: Former    Current packs/day: 0.75    Average packs/day: 0.8 packs/day for 3.0 years (2.3 ttl pk-yrs)    Types: Cigarettes   Smokeless tobacco: Never   Tobacco comments:    02/13/2013 "quit smoking age 54"  Vaping Use   Vaping status: Never Used  Substance and Sexual Activity   Alcohol use: No    Comment: 02/13/2013 "probably a pint of whiskey/wk up til I was probably 88 yr old"   Drug use: No   Sexual activity: Yes    Birth control/protection: None  Other Topics Concern   Not on file  Social History Narrative   Lives with wife   Caffeine use: 15-16oz soda per day, no soda   Usually drinks 60 oz water daily   Social Determinants of Health   Financial Resource Strain: Low Risk  (12/07/2022)   Overall Financial Resource Strain (CARDIA)    Difficulty of Paying Living Expenses: Not hard at all  Food Insecurity: No Food Insecurity (12/07/2022)   Hunger Vital Sign    Worried About Running Out of Food in the Last Year: Never true    Ran Out of Food in the Last Year: Never true  Transportation Needs: No Transportation Needs (12/07/2022)   PRAPARE - Administrator, Civil Service (Medical): No    Lack of Transportation (Non-Medical): No  Physical Activity: Insufficiently Active (12/07/2022)   Exercise Vital Sign    Days of Exercise per Week: 3 days    Minutes of Exercise per Session: 30 min  Stress: No Stress Concern Present (12/07/2022)   [employer] of Occupational Health - Occupational Stress Questionnaire    Feeling of Stress : Not at all  Social Connections: Socially Integrated (12/07/2022)   Social Connection and Isolation Panel  [NHANES]    Frequency of Communication with Friends and Family: More than three times a week    Frequency of Social Gatherings with Friends and Family: More than three times a week    Attends Religious Services: More than 4 times per year    Active Member of [employer] or Organizations: Yes    Attends Banker Meetings: More than 4 times per year    Marital Status: Married  Catering manager Violence: Not At Risk (12/07/2022)   Humiliation, Afraid, Rape, and Kick questionnaire    Fear of Current or Ex-Partner: No    Emotionally  Abused: No    Physically Abused: No    Sexually Abused: No   Family History  Problem Relation Age of Onset   Cancer Mother        breast   Alzheimer's disease Father    Cancer Sister        Breast cancer   Cancer Sister        colon cancer   Anemia Neg Hx    Arrhythmia Neg Hx    Asthma Neg Hx    Clotting disorder Neg Hx    Fainting Neg Hx    Heart attack Neg Hx    Heart disease Neg Hx    Heart failure Neg Hx    Hyperlipidemia Neg Hx    Hypertension Neg Hx    Migraines Neg Hx     Review of Systems  Constitutional:  Negative for chills and fever.  HENT:  Positive for congestion, dental problem and sinus pressure. Negative for ear discharge, ear pain, rhinorrhea, sinus pain and sore throat.        Patient has sensitivity/pain in his left upper molar.  Eyes:  Negative for visual disturbance.  Respiratory:  Negative for cough, chest tightness and shortness of breath.   Cardiovascular:  Positive for palpitations. Negative for chest pain.       Palpitations controlled with atenolol  Gastrointestinal:  Positive for diarrhea. Negative for abdominal pain, nausea and vomiting.       Chronic diarrhea  Genitourinary:  Negative for dysuria and hematuria.  Musculoskeletal:  Negative for arthralgias and myalgias.  Neurological:  Positive for dizziness and headaches. Negative for syncope, weakness and light-headedness.     Objective: Office vital signs  reviewed. BP (!) 148/75   Pulse 60   Temp 97.9 F (36.6 C) (Temporal)   Ht 6\' 1"  (1.854 m)   Wt 194 lb 9.6 oz (88.3 kg)   SpO2 95%   BMI 25.67 kg/m   Physical Examination:  Physical Exam Vitals and nursing note reviewed.  Constitutional:      General: He is not in acute distress.    Appearance: Normal appearance.  HENT:     Head: Normocephalic and atraumatic.     Right Ear: Tympanic membrane, ear canal and external ear normal.     Left Ear: External ear normal.     Ears:     Comments: Scarring on left TM with erythema/irritation in left ear canal from cotton ball placed in ear by patient    Nose: Congestion present.     Mouth/Throat:     Mouth: Mucous membranes are moist.     Pharynx: Oropharynx is clear.  Eyes:     Conjunctiva/sclera: Conjunctivae normal.  Cardiovascular:     Rate and Rhythm: Normal rate and regular rhythm.     Heart sounds: Normal heart sounds.  Pulmonary:     Effort: Pulmonary effort is normal. No respiratory distress.     Breath sounds: Normal breath sounds.  Abdominal:     General: Abdomen is flat.     Palpations: Abdomen is soft.     Tenderness: There is no abdominal tenderness.     Comments: Mild tenderness to palpation diffusely from chronic diarrhea  Musculoskeletal:     Right lower leg: No edema.     Left lower leg: No edema.  Skin:    General: Skin is warm and dry.     Findings: No rash.  Neurological:     Mental Status: He is alert and oriented to  person, place, and time.  Psychiatric:        Mood and Affect: Mood normal.        Behavior: Behavior normal.        Thought Content: Thought content normal.        Judgment: Judgment normal.     Results for orders placed or performed in visit on 03/21/23  CBC with Differential/Platelet  Result Value Ref Range   WBC 5.8 3.4 - 10.8 x10E3/uL   RBC 5.08 4.14 - 5.80 x10E6/uL   Hemoglobin 16.5 13.0 - 17.7 g/dL   Hematocrit 95.2 84.1 - 51.0 %   MCV 95 79 - 97 fL   MCH 32.5 26.6 - 33.0 pg    MCHC 34.2 31.5 - 35.7 g/dL   RDW 32.4 40.1 - 02.7 %   Platelets 129 (L) 150 - 450 x10E3/uL   Neutrophils 68 Not Estab. %   Lymphs 19 Not Estab. %   Monocytes 10 Not Estab. %   Eos 2 Not Estab. %   Basos 1 Not Estab. %   Neutrophils Absolute 3.9 1.4 - 7.0 x10E3/uL   Lymphocytes Absolute 1.1 0.7 - 3.1 x10E3/uL   Monocytes Absolute 0.6 0.1 - 0.9 x10E3/uL   EOS (ABSOLUTE) 0.1 0.0 - 0.4 x10E3/uL   Basophils Absolute 0.1 0.0 - 0.2 x10E3/uL   Immature Granulocytes 0 Not Estab. %   Immature Grans (Abs) 0.0 0.0 - 0.1 x10E3/uL  CMP14+EGFR  Result Value Ref Range   Glucose 97 70 - 99 mg/dL   BUN 14 8 - 27 mg/dL   Creatinine, Ser 2.53 0.76 - 1.27 mg/dL   eGFR 58 (L) >66 YQ/IHK/7.42   BUN/Creatinine Ratio 12 10 - 24   Sodium 140 134 - 144 mmol/L   Potassium 4.1 3.5 - 5.2 mmol/L   Chloride 106 96 - 106 mmol/L   CO2 18 (L) 20 - 29 mmol/L   Calcium 9.3 8.6 - 10.2 mg/dL   Total Protein 6.2 6.0 - 8.5 g/dL   Albumin 4.2 3.7 - 4.7 g/dL   Globulin, Total 2.0 1.5 - 4.5 g/dL   Bilirubin Total 0.7 0.0 - 1.2 mg/dL   Alkaline Phosphatase 62 44 - 121 IU/L   AST 19 0 - 40 IU/L   ALT 13 0 - 44 IU/L  TSH  Result Value Ref Range   TSH 3.780 0.450 - 4.500 uIU/mL     Assessment/ Plan: Windy Fast "RON" was seen today for dizziness and nausea.  Diagnoses and all orders for this visit:  Dizziness Symptoms seem consistent with patient's chronic dizziness/vertigo. Do not suspect ear infection given unchanged ear exam. Symptoms could be attributable to seasonal allergies given presentation after extensive time outdoors and with concurrent nasal congestion and sinus pressure, but patient does not like Flonase or allergy medications. Will not prescribe antibiotic for patient at this time due to recurrent use. Will reach out to infectious disease for their thoughts regarding antibiotic treatment. -     dexamethasone (DECADRON) injection 4 mg    Continue all other maintenance medications.  Follow up  plan: Return if symptoms worsen or fail to improve.   The above assessment and management plan was discussed with the patient. The patient verbalized understanding of and has agreed to the management plan. Patient is aware to call the clinic if symptoms persist or worsen. Patient is aware when to return to the clinic for a follow-up visit. Patient educated on when it is appropriate to go to the emergency department.  Gillermina Phy, Medical Student [facility] [address] [place], [place] [address] [phone]

## 2023-04-15 NOTE — Telephone Encounter (Signed)
He can take his prescribed medications for the dizziness. The injection should last for several days.

## 2023-04-15 NOTE — Telephone Encounter (Signed)
Patient aware and verbalizes understanding. 

## 2023-04-15 NOTE — Telephone Encounter (Signed)
Lmtcb.

## 2023-04-15 NOTE — Telephone Encounter (Signed)
Patient calling to see what he can do in the time being if the shot wears of and he starts to get dizzy again. Please call back and advise.

## 2023-04-17 ENCOUNTER — Other Ambulatory Visit: Payer: Self-pay | Admitting: Internal Medicine

## 2023-05-19 ENCOUNTER — Ambulatory Visit: Payer: Self-pay | Admitting: Family Medicine

## 2023-05-19 NOTE — Telephone Encounter (Signed)
  Chief Complaint: diarrhea x1 month Symptoms: diarrhea, occasional ABD  pain and vomiting Frequency: 3-5 times per day Pertinent Negatives: Patient denies ABD pain currently, denies fever, denies current ABX use, denies vomiting at this time.  Disposition: [] ED /[] Urgent Care (no appt availability in office) / [x] Appointment(In office/virtual)/ []  [facility]/ [] Home Care/ [] Refused Recommended Disposition /[]  Mobile Bus/ []  Follow-up with PCP Additional Notes: Care advice given for increasing fluid intake and bland diet. Pt agreeable.  Pt advised to call back should s/s worsen.  Copied from CRM [id number]. Topic: Clinical - Pink Word Triage >> May 19, 2023 11:37 AM Dondra Prader A wrote: Reason for Triage: Pt is complaining of diarrhea getting worse the past month Reason for Disposition  [1] Recent antibiotic therapy (i.e., within last 2 months) AND [2] diarrhea present > 3 days since antibiotic was stopped  Answer Assessment - Initial Assessment Questions 1. DIARRHEA SEVERITY: "How bad is the diarrhea?" "How many more stools have you had in the past 24 hours than normal?"    - NO DIARRHEA (SCALE 0)   - MILD (SCALE 1-3): Few loose or mushy BMs; increase of 1-3 stools over normal daily number of stools; mild increase in ostomy output.   -  MODERATE (SCALE 4-7): Increase of 4-6 stools daily over normal; moderate increase in ostomy output.   -  SEVERE (SCALE 8-10; OR "WORST POSSIBLE"): Increase of 7 or more stools daily over normal; moderate increase in ostomy output; incontinence.     Mild to moderate 2. ONSET: "When did the diarrhea begin?"      Ongoing on/off for a month 3. BM CONSISTENCY: "How loose or watery is the diarrhea?"      Solid in beginning then watery 4. VOMITING: "Are you also vomiting?" If Yes, ask: "How many times in the past 24 hours?"      denies 5. ABDOMEN PAIN: "Are you having any abdomen pain?" If Yes, ask: "What does it feel like?" (e.g., crampy, dull,  intermittent, constant)      Lower quadrants 6. ABDOMEN PAIN SEVERITY: If present, ask: "How bad is the pain?"  (e.g., Scale 1-10; mild, moderate, or severe)   - MILD (1-3): doesn't interfere with normal activities, abdomen soft and not tender to touch    - MODERATE (4-7): interferes with normal activities or awakens from sleep, abdomen tender to touch    - SEVERE (8-10): excruciating pain, doubled over, unable to do any normal activities       0 right now, during the AM BM it was 7/8 7. ORAL INTAKE: If vomiting, "Have you been able to drink liquids?" "How much liquids have you had in the past 24 hours?"     Yes, 60 oz every day 8. HYDRATION: "Any signs of dehydration?" (e.g., dry mouth [not just dry lips], too weak to stand, dizziness, new weight loss) "When did you last urinate?"     denies 9. EXPOSURE: "Have you traveled to a foreign country recently?" "Have you been exposed to anyone with diarrhea?" "Could you have eaten any food that was spoiled?"     denies 10. ANTIBIOTIC USE: "Are you taking antibiotics now or have you taken antibiotics in the past 2 months?"       Amox in the last 2-3 months 11. OTHER SYMPTOMS: "Do you have any other symptoms?" (e.g., fever, blood in stool)       denies  Protocols used: Diarrhea-A-AH

## 2023-05-19 NOTE — Telephone Encounter (Signed)
Okay I will see him tomorrow and I agree he does need an appointment.

## 2023-05-20 ENCOUNTER — Encounter: Payer: Self-pay | Admitting: Family Medicine

## 2023-05-20 ENCOUNTER — Ambulatory Visit (INDEPENDENT_AMBULATORY_CARE_PROVIDER_SITE_OTHER): Payer: PPO | Admitting: Family Medicine

## 2023-05-20 VITALS — BP 135/71 | HR 69 | Ht 73.0 in | Wt 196.0 lb

## 2023-05-20 DIAGNOSIS — R42 Dizziness and giddiness: Secondary | ICD-10-CM | POA: Diagnosis not present

## 2023-05-20 DIAGNOSIS — R197 Diarrhea, unspecified: Secondary | ICD-10-CM

## 2023-05-20 NOTE — Progress Notes (Signed)
BP 135/71   Pulse 69   Ht 6\' 1"  (1.854 m)   Wt 196 lb (88.9 kg)   SpO2 96%   BMI 25.86 kg/m    Subjective:   Patient ID: Ronald Lamas Sr., male    DOB: September 16, 1934, 88 y.o.   MRN: [id number]  HPI: Ronald Stahlnecker. is a 88 y.o. male presenting on 05/20/2023 for Diarrhea and Dizziness (Prednisone and Dexamethasone given two different times. Both helped dizziness but caused diarrhea and vomiting.)   HPI Patient comes in complaining of persistent dizziness that he has been fighting off and on for many months, maybe even a year.  He says he is had different rounds of prednisone and amoxicillin in the past and other antibiotics that have helped to try and clear it up.  He is also had doxycycline for quite some time because of [facility] spotted fever and he just still feels like he has this dizziness and feeling off balance in his head.  He denies any headaches or congestion.  He also says that after multiple rounds of antibiotics he has been having some loose stools.  He says he will have about 3 bowel movements a day and some of them will be formed but then some of them will be loose and some of them even watery as well.  He denies any fevers or chills or abdominal pain.  Relevant past medical, surgical, family and social history reviewed and updated as indicated. Interim medical history since our last visit reviewed. Allergies and medications reviewed and updated.  Review of Systems  Constitutional:  Negative for chills and fever.  Eyes:  Negative for visual disturbance.  Respiratory:  Negative for shortness of breath and wheezing.   Cardiovascular:  Negative for chest pain and leg swelling.  Gastrointestinal:  Positive for diarrhea. Negative for abdominal pain, anal bleeding, blood in stool and constipation.  Musculoskeletal:  Negative for back pain and gait problem.  Skin:  Negative for rash.  Neurological:  Positive for dizziness. Negative for speech difficulty, weakness and  light-headedness.  All other systems reviewed and are negative.   Per HPI unless specifically indicated above   Allergies as of 05/20/2023       Reactions   Uloric [febuxostat] Palpitations, Other (See Comments), Hypertension   Hypertension with palpitations and a sense of fuzziness and dizziness at the left side of his head   Cholestyramine Nausea Only   Doxazosin Diarrhea   Doxycycline Diarrhea   Omeprazole Other (See Comments)   Pantoprazole Other (See Comments)   Can tolerate 20mg    Pravastatin Other (See Comments)   Rosuvastatin Other (See Comments)   Augmentin [amoxicillin-pot Clavulanate] Diarrhea   Ciprofloxacin Diarrhea   Codeine Nausea And Vomiting, Other (See Comments)   Severe headache   Levaquin [levofloxacin] Diarrhea   Oxycodone Nausea And Vomiting   Tramadol Nausea And Vomiting   Vicodin [hydrocodone-acetaminophen] Nausea And Vomiting        Medication List        Accurate as of May 20, 2023  2:08 PM. If you have any questions, ask your nurse or doctor.          acetaminophen 500 MG tablet Commonly known as: TYLENOL Take 500 mg by mouth as needed.   atenolol 25 MG tablet Commonly known as: TENORMIN TAKE 1/4 TABLET BY MOUTH DAILY, MAY TAKE ADDITIONAL 1/4 TABLET DAILY AS NEEDED FOR PALPITATIONS   famotidine 20 MG tablet Commonly known as: PEPCID TAKE 1 TABLET  BY MOUTH TWICE A DAY   fluocinonide cream 0.05 % Commonly known as: LIDEX APPLY TO AFFECTED AREA TWICE A DAY   hydrocortisone 25 MG suppository Commonly known as: ANUSOL-HC PLACE 1 SUPPOSITORY RECTALLY 2 TIMES DAILY.   lansoprazole 15 MG capsule Commonly known as: PREVACID TAKE 1 CAPSULE (15 MG TOTAL) BY MOUTH DAILY AT 12 NOON.   Potassium 99 MG Tabs Take 1 tablet by mouth every other day.   Praluent 75 MG/ML Soaj Generic drug: Alirocumab Inject 1 mL (75 mg total) into the skin every 14 (fourteen) days.   triamcinolone cream 0.1 % Commonly known as: KENALOG APPLY 1  APPLICATION TOPICALLY TWICE A DAY   VITAMIN D PO Take 1 tablet by mouth daily.         Objective:   BP 135/71   Pulse 69   Ht 6\' 1"  (1.854 m)   Wt 196 lb (88.9 kg)   SpO2 96%   BMI 25.86 kg/m   Wt Readings from Last 3 Encounters:  05/20/23 196 lb (88.9 kg)  04/14/23 194 lb 9.6 oz (88.3 kg)  03/21/23 195 lb (88.5 kg)    Physical Exam Vitals and nursing note reviewed.  Constitutional:      General: He is not in acute distress.    Appearance: He is well-developed. He is not diaphoretic.  Eyes:     General: No scleral icterus.    Conjunctiva/sclera: Conjunctivae normal.  Neck:     Thyroid: No thyromegaly.  Cardiovascular:     Rate and Rhythm: Normal rate and regular rhythm.     Heart sounds: Normal heart sounds. No murmur heard. Pulmonary:     Effort: Pulmonary effort is normal. No respiratory distress.     Breath sounds: Normal breath sounds. No wheezing.  Abdominal:     General: Abdomen is flat. Bowel sounds are normal. There is no distension.     Palpations: Abdomen is soft.     Tenderness: There is no abdominal tenderness. There is no guarding or rebound.  Musculoskeletal:        General: Normal range of motion.     Cervical back: Neck supple.  Lymphadenopathy:     Cervical: No cervical adenopathy.  Skin:    General: Skin is warm and dry.     Findings: No rash.  Neurological:     Mental Status: He is alert and oriented to person, place, and time.     Coordination: Coordination normal.  Psychiatric:        Behavior: Behavior normal.       Assessment & Plan:   Problem List Items Addressed This Visit       Other   Dizziness - Primary   Relevant Orders   Ambulatory referral to Neurology   Diarrhea   Relevant Orders   Cdiff NAA+O+P+Stool Culture    Patient is feeling dizzy and off balance and they were considered send him to infectious disease because they think he might have some chronic inflammation or chronic viral infection and eustachian tube  or balance centers. Follow up plan: Return if symptoms worsen or fail to improve.  Counseling provided for all of the vaccine components Orders Placed This Encounter  Procedures   Cdiff NAA+O+P+Stool Culture   Ambulatory referral to Neurology    Arville Care, MD [facility] 05/20/2023, 2:08 PM

## 2023-05-26 ENCOUNTER — Other Ambulatory Visit: Payer: Self-pay | Admitting: Family Medicine

## 2023-05-26 DIAGNOSIS — R42 Dizziness and giddiness: Secondary | ICD-10-CM

## 2023-05-30 DIAGNOSIS — K0889 Other specified disorders of teeth and supporting structures: Secondary | ICD-10-CM | POA: Diagnosis not present

## 2023-06-04 DIAGNOSIS — K0889 Other specified disorders of teeth and supporting structures: Secondary | ICD-10-CM | POA: Diagnosis not present

## 2023-06-06 ENCOUNTER — Telehealth: Payer: Self-pay

## 2023-06-06 NOTE — Telephone Encounter (Signed)
Copied from CRM [id number]. Topic: Clinical - Medication Question >> Jun 06, 2023  9:14 AM Carlatta H wrote: Reason for CRM: Patient went to the dentist about 1 week ago. The medication they gave him makes him sick//He would like to [facility] for a shot instead//Please call patient he stated Gilford Silvius has given him a shot before for pain//

## 2023-06-06 NOTE — Telephone Encounter (Signed)
Pt stated he has had root canals and teeth pulled and is having dental pain and wants a shot. Pt stated the dentist advised him to take prednisone and pt had some at home that he started taking but said it makes him sick when he takes it. Informed pt we cannot give him a shot or anything without being seen. Pt states all he has to do is come in and talk to [place] and she will give him a shot. Informed pt that cannot be done and that michelle is not here today. Pt also states he has been taking extra strength tylenol 1000mg  every 6 hours for 28 days now. Advised pt that he needs to stop taking that much tylenol because it can affect his liver. Pt verbalized understanding. Appt made for Thursday with pcp

## 2023-06-09 ENCOUNTER — Ambulatory Visit: Payer: PPO

## 2023-06-09 ENCOUNTER — Encounter: Payer: Self-pay | Admitting: Family Medicine

## 2023-06-09 VITALS — BP 144/48 | HR 67 | Temp 97.8°F | Ht 73.0 in | Wt 194.0 lb

## 2023-06-09 DIAGNOSIS — K22719 Barrett's esophagus with dysplasia, unspecified: Secondary | ICD-10-CM | POA: Diagnosis not present

## 2023-06-09 DIAGNOSIS — K0889 Other specified disorders of teeth and supporting structures: Secondary | ICD-10-CM

## 2023-06-09 DIAGNOSIS — R197 Diarrhea, unspecified: Secondary | ICD-10-CM

## 2023-06-09 DIAGNOSIS — K0510 Chronic gingivitis, plaque induced: Secondary | ICD-10-CM | POA: Diagnosis not present

## 2023-06-09 MED ORDER — DEXAMETHASONE SODIUM PHOSPHATE 4 MG/ML IJ SOLN
4.0000 mg | Freq: Once | INTRAMUSCULAR | Status: AC
  Administered 2023-06-09: 4 mg via INTRAMUSCULAR
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
## 2023-06-10 ENCOUNTER — Encounter (INDEPENDENT_AMBULATORY_CARE_PROVIDER_SITE_OTHER): Payer: Self-pay | Admitting: Otolaryngology

## 2023-06-16 ENCOUNTER — Emergency Department (HOSPITAL_COMMUNITY)
Admission: EM | Admit: 2023-06-16 | Discharge: 2023-06-16 | Disposition: A | Discharge status: Home / Self Care | Payer: No Typology Code available for payment source | Attending: Emergency Medicine | Admitting: Emergency Medicine

## 2023-06-16 ENCOUNTER — Emergency Department (HOSPITAL_COMMUNITY): Payer: No Typology Code available for payment source

## 2023-06-16 ENCOUNTER — Encounter (HOSPITAL_COMMUNITY): Payer: Self-pay

## 2023-06-16 DIAGNOSIS — I10 Essential (primary) hypertension: Secondary | ICD-10-CM | POA: Diagnosis not present

## 2023-06-16 DIAGNOSIS — R197 Diarrhea, unspecified: Secondary | ICD-10-CM | POA: Insufficient documentation

## 2023-06-16 DIAGNOSIS — Z87891 Personal history of nicotine dependence: Secondary | ICD-10-CM | POA: Diagnosis not present

## 2023-06-16 DIAGNOSIS — Z951 Presence of aortocoronary bypass graft: Secondary | ICD-10-CM | POA: Diagnosis not present

## 2023-06-16 DIAGNOSIS — I2510 Atherosclerotic heart disease of native coronary artery without angina pectoris: Secondary | ICD-10-CM | POA: Insufficient documentation

## 2023-06-16 DIAGNOSIS — Z8551 Personal history of malignant neoplasm of bladder: Secondary | ICD-10-CM | POA: Insufficient documentation

## 2023-06-16 DIAGNOSIS — R079 Chest pain, unspecified: Secondary | ICD-10-CM | POA: Diagnosis not present

## 2023-06-16 DIAGNOSIS — I4430 Unspecified atrioventricular block: Secondary | ICD-10-CM | POA: Diagnosis not present

## 2023-06-16 DIAGNOSIS — R140 Abdominal distension (gaseous): Secondary | ICD-10-CM | POA: Insufficient documentation

## 2023-06-16 DIAGNOSIS — R000 Tachycardia, unspecified: Secondary | ICD-10-CM | POA: Diagnosis not present

## 2023-06-16 LAB — BASIC METABOLIC PANEL
Anion gap: 7 (ref 5–15)
BUN: 14 mg/dL (ref 8–23)
CO2: 23 mmol/L (ref 22–32)
Calcium: 8.9 mg/dL (ref 8.9–10.3)
Chloride: 106 mmol/L (ref 98–111)
Creatinine, Ser: 1.21 mg/dL (ref 0.61–1.24)
GFR, Estimated: 58 mL/min — ABNORMAL LOW (ref >60)
Glucose, Bld: 106 mg/dL — ABNORMAL HIGH (ref 70–99)
Potassium: 4 mmol/L (ref 3.5–5.1)
Sodium: 136 mmol/L (ref 135–145)

## 2023-06-16 LAB — CBC
HCT: 45.7 % (ref 39.0–52.0)
Hemoglobin: 16.2 g/dL (ref 13.0–17.0)
MCH: 33.1 pg (ref 26.0–34.0)
MCHC: 35.4 g/dL (ref 30.0–36.0)
MCV: 93.5 fL (ref 80.0–100.0)
Platelets: 126 10*3/uL — ABNORMAL LOW (ref 150–400)
RBC: 4.89 MIL/uL (ref 4.22–5.81)
RDW: 13.6 % (ref 11.5–15.5)
WBC: 6.7 10*3/uL (ref 4.0–10.5)
nRBC: 0 % (ref 0.0–0.2)

## 2023-06-16 LAB — TROPONIN I (HIGH SENSITIVITY)
Troponin I (High Sensitivity): 9 ng/L (ref <18)
Troponin I (High Sensitivity): 10 ng/L (ref <18)

## 2023-06-16 MED ORDER — IOHEXOL 350 MG/ML SOLN
75.0000 mL | Freq: Once | INTRAVENOUS | Status: AC | PRN
  Administered 2023-06-16: 75 mL via INTRAVENOUS
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
## 2023-06-16 NOTE — ED Triage Notes (Signed)
 Pt BIB [facility] from home d/t chest pressure mainly upon exhalation - onset morning of 06/14/2022.

## 2023-06-16 NOTE — Discharge Instructions (Signed)
 You were evaluated in the Emergency Department and after careful evaluation, we did not find any emergent condition requiring admission or further testing in the hospital.  Your exam/testing today is overall reassuring.  Recommend continued follow-up with your gastroenterologist.  Please return to the Emergency Department if you experience any worsening of your condition.   Thank you for allowing us  to be a part of your care.

## 2023-06-16 NOTE — ED Notes (Signed)
 Patient discharged by this RN. Patient verbalizes understanding of instructions with no additional questions. In wheelchair to lobby with wife.

## 2023-06-21 ENCOUNTER — Ambulatory Visit: Payer: PPO

## 2023-06-21 ENCOUNTER — Telehealth (INDEPENDENT_AMBULATORY_CARE_PROVIDER_SITE_OTHER): Payer: Self-pay | Admitting: *Deleted

## 2023-06-21 NOTE — Telephone Encounter (Signed)
 Called patient to schedule referral, made him aware if he wanted to [place] to pay for the visit we would  need a referral/authorization from the [place] - he stated he wanted his [employer] to be filed for this visit

## 2023-06-29 ENCOUNTER — Other Ambulatory Visit: Payer: PPO

## 2023-06-29 DIAGNOSIS — R197 Diarrhea, unspecified: Secondary | ICD-10-CM | POA: Diagnosis not present

## 2023-07-04 NOTE — Telephone Encounter (Signed)
Will address when ordering provider views results

## 2023-07-04 NOTE — Telephone Encounter (Signed)
Copied from CRM [id number]. Topic: Clinical - Lab/Test Results >> Jul 04, 2023  8:43 AM Geroge Baseman wrote: Patient is calling because he was checking to see if his results for stool sample were ready and would like a call back to speak about them when they are ready.

## 2023-07-05 ENCOUNTER — Ambulatory Visit: Payer: Self-pay | Admitting: Family Medicine

## 2023-07-05 LAB — CDIFF NAA+O+P+STOOL CULTURE
E coli, Shiga toxin Assay: NEGATIVE
Toxigenic C. Difficile by PCR: NEGATIVE

## 2023-07-05 NOTE — Telephone Encounter (Signed)
Copied from CRM [id number]. Topic: Clinical - Pink Word Triage >> Jul 05, 2023  4:37 PM Ronald Russell wrote: Reason for Triage: Uncontrolled diarrhea; He has been to bathroom 4 times this morning; Smells very bad, stomach hurts.  Patient stated he has been having diarrhea for a while now. He stated that he was seen for this problem and had stool testing done. He stated his symptoms have been the same. He denies any new or worsening symptoms. He had 4 episodes of diarrhea this morning and nothing since. He has been waiting on the results of the stool sample but has not heard anything. It appears that the results came back today. I informed patient that the results will need to be reviewed by the provider and the office will follow up with him.

## 2023-07-06 ENCOUNTER — Encounter: Payer: Self-pay | Admitting: Family Medicine

## 2023-07-06 NOTE — Telephone Encounter (Signed)
I agree as well if he continues to have symptoms then he needs to go to gastroenterology

## 2023-07-06 NOTE — Telephone Encounter (Signed)
Patient aware and verbalizes understanding. 

## 2023-07-07 ENCOUNTER — Ambulatory Visit: Payer: PPO | Admitting: Family Medicine

## 2023-07-08 ENCOUNTER — Ambulatory Visit (INDEPENDENT_AMBULATORY_CARE_PROVIDER_SITE_OTHER): Payer: PPO | Admitting: Family Medicine

## 2023-07-08 ENCOUNTER — Encounter: Payer: Self-pay | Admitting: Family Medicine

## 2023-07-08 VITALS — BP 106/55 | HR 64 | Temp 97.1°F | Ht 73.0 in | Wt 189.6 lb

## 2023-07-08 DIAGNOSIS — R197 Diarrhea, unspecified: Secondary | ICD-10-CM | POA: Diagnosis not present

## 2023-07-08 DIAGNOSIS — E782 Mixed hyperlipidemia: Secondary | ICD-10-CM

## 2023-07-08 DIAGNOSIS — E559 Vitamin D deficiency, unspecified: Secondary | ICD-10-CM | POA: Diagnosis not present

## 2023-07-08 MED ORDER — METAMUCIL SMOOTH TEXTURE 58.6 % PO POWD: 1.0000 | Freq: Three times a day (TID) | ORAL | 12 refills | Status: AC
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
## 2023-07-09 LAB — CMP14+EGFR
ALT: 17 [IU]/L (ref 0–44)
AST: 17 [IU]/L (ref 0–40)
Albumin: 4.4 g/dL (ref 3.7–4.7)
Alkaline Phosphatase: 62 [IU]/L (ref 44–121)
BUN/Creatinine Ratio: 10 (ref 10–24)
BUN: 13 mg/dL (ref 8–27)
Bilirubin Total: 0.8 mg/dL (ref 0.0–1.2)
CO2: 20 mmol/L (ref 20–29)
Calcium: 9.4 mg/dL (ref 8.6–10.2)
Chloride: 102 mmol/L (ref 96–106)
Creatinine, Ser: 1.27 mg/dL (ref 0.76–1.27)
Globulin, Total: 2.1 g/dL (ref 1.5–4.5)
Glucose: 96 mg/dL (ref 70–99)
Potassium: 4.2 mmol/L (ref 3.5–5.2)
Sodium: 139 mmol/L (ref 134–144)
Total Protein: 6.5 g/dL (ref 6.0–8.5)
eGFR: 55 mL/min/{1.73_m2} — ABNORMAL LOW (ref >59)

## 2023-07-09 LAB — LIPID PANEL
Chol/HDL Ratio: 4.3 {ratio} (ref 0.0–5.0)
Cholesterol, Total: 166 mg/dL (ref 100–199)
HDL: 39 mg/dL — ABNORMAL LOW (ref >39)
LDL Chol Calc (NIH): 102 mg/dL — ABNORMAL HIGH (ref 0–99)
Triglycerides: 140 mg/dL (ref 0–149)
VLDL Cholesterol Cal: 25 mg/dL (ref 5–40)

## 2023-07-09 LAB — VITAMIN D 25 HYDROXY (VIT D DEFICIENCY, FRACTURES): Vit D, 25-Hydroxy: 44.3 ng/mL (ref 30.0–100.0)

## 2023-07-11 ENCOUNTER — Ambulatory Visit (INDEPENDENT_AMBULATORY_CARE_PROVIDER_SITE_OTHER): Payer: No Typology Code available for payment source | Admitting: Gastroenterology

## 2023-07-11 ENCOUNTER — Encounter: Payer: Self-pay | Admitting: Family Medicine

## 2023-07-14 ENCOUNTER — Encounter (INDEPENDENT_AMBULATORY_CARE_PROVIDER_SITE_OTHER): Payer: Self-pay | Admitting: Gastroenterology

## 2023-07-14 ENCOUNTER — Ambulatory Visit (INDEPENDENT_AMBULATORY_CARE_PROVIDER_SITE_OTHER): Payer: PPO | Admitting: Gastroenterology

## 2023-07-14 VITALS — BP 131/78 | HR 65 | Temp 98.0°F | Ht 72.0 in | Wt 191.3 lb

## 2023-07-14 DIAGNOSIS — K2270 Barrett's esophagus without dysplasia: Secondary | ICD-10-CM

## 2023-07-14 DIAGNOSIS — R109 Unspecified abdominal pain: Secondary | ICD-10-CM

## 2023-07-14 DIAGNOSIS — K219 Gastro-esophageal reflux disease without esophagitis: Secondary | ICD-10-CM

## 2023-07-14 DIAGNOSIS — R110 Nausea: Secondary | ICD-10-CM | POA: Diagnosis not present

## 2023-07-14 DIAGNOSIS — G8929 Other chronic pain: Secondary | ICD-10-CM

## 2023-07-14 DIAGNOSIS — K529 Noninfective gastroenteritis and colitis, unspecified: Secondary | ICD-10-CM

## 2023-07-14 DIAGNOSIS — K22719 Barrett's esophagus with dysplasia, unspecified: Secondary | ICD-10-CM

## 2023-07-14 DIAGNOSIS — R112 Nausea with vomiting, unspecified: Secondary | ICD-10-CM

## 2023-07-14 MED ORDER — ONDANSETRON HCL 4 MG PO TABS: 4.0000 mg | ORAL_TABLET | Freq: Two times a day (BID) | ORAL | 2 refills | Status: DC | PRN
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
## 2023-07-14 NOTE — Patient Instructions (Signed)
Schedule EGD Start Zofran 4 mg q8h as needed for nausea Continue Prevacid 15 mg qday
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
## 2023-07-18 ENCOUNTER — Encounter (INDEPENDENT_AMBULATORY_CARE_PROVIDER_SITE_OTHER): Payer: Self-pay | Admitting: *Deleted

## 2023-07-18 ENCOUNTER — Other Ambulatory Visit (INDEPENDENT_AMBULATORY_CARE_PROVIDER_SITE_OTHER): Payer: Self-pay | Admitting: Gastroenterology

## 2023-07-18 ENCOUNTER — Telehealth (INDEPENDENT_AMBULATORY_CARE_PROVIDER_SITE_OTHER): Payer: Self-pay | Admitting: *Deleted

## 2023-07-18 DIAGNOSIS — R112 Nausea with vomiting, unspecified: Secondary | ICD-10-CM

## 2023-07-18 MED ORDER — PROCHLORPERAZINE MALEATE 10 MG PO TABS: 10.0000 mg | ORAL_TABLET | Freq: Three times a day (TID) | ORAL | 0 refills | Status: DC | PRN

## 2023-07-18 NOTE — Telephone Encounter (Signed)
Insurance denied request for ondansetron 4mg . I looked up on [facility]. He can get #60 tablets for $19.73. I discussed this with the patient and he wanted to know if there was something else he could take for nausea instead of paying $19.73.

## 2023-07-18 NOTE — Telephone Encounter (Signed)
I sent Compazine to his pharmacy.  He will need to check with the pharmacy if this is cheaper than Zofran.

## 2023-07-18 NOTE — Telephone Encounter (Signed)
FYI - Patient aware compazine was sent in and to get which ever one is cheaper. He then told me after speaking to him that he took zofran and he had a seizure. I let him know we did not have zofran on his allergy list but I added it and for him to pick up the compazine. He verbalized understanding.

## 2023-07-18 NOTE — Telephone Encounter (Signed)
 Thanks for the update

## 2023-08-02 DIAGNOSIS — R8289 Other abnormal findings on cytological and histological examination of urine: Secondary | ICD-10-CM | POA: Diagnosis not present

## 2023-08-02 DIAGNOSIS — Z8551 Personal history of malignant neoplasm of bladder: Secondary | ICD-10-CM | POA: Diagnosis not present

## 2023-08-03 ENCOUNTER — Encounter (HOSPITAL_COMMUNITY): Payer: Self-pay

## 2023-08-03 ENCOUNTER — Encounter (HOSPITAL_COMMUNITY)
Admission: RE | Admit: 2023-08-03 | Discharge: 2023-08-03 | Disposition: A | Discharge status: Home / Self Care | Payer: No Typology Code available for payment source | Source: Ambulatory Visit | Attending: Gastroenterology | Admitting: Gastroenterology

## 2023-08-08 ENCOUNTER — Encounter: Payer: Self-pay | Admitting: Internal Medicine

## 2023-08-08 ENCOUNTER — Ambulatory Visit: Payer: PPO | Attending: Internal Medicine | Admitting: Internal Medicine

## 2023-08-08 VITALS — BP 122/70 | HR 70 | Ht 72.0 in | Wt 189.0 lb

## 2023-08-08 DIAGNOSIS — I2510 Atherosclerotic heart disease of native coronary artery without angina pectoris: Secondary | ICD-10-CM

## 2023-08-08 NOTE — Patient Instructions (Signed)
 Medication Instructions:  Your physician recommends that you continue on your current medications as directed. Please refer to the Current Medication list given to you today.  *If you need a refill on your cardiac medications before your next appointment, please call your pharmacy*   Lab Work: NONE   If you have labs (blood work) drawn today and your tests are completely normal, you will receive your results only by: MyChart Message (if you have MyChart) OR A paper copy in the mail If you have any lab test that is abnormal or we need to change your treatment, we will call you to review the results.   Testing/Procedures: NONE    Follow-Up: At [facility], you and your health needs are our priority.  As part of our continuing mission to provide you with exceptional heart care, we have created designated Provider Care Teams.  These Care Teams include your primary Cardiologist (physician) and Advanced Practice Providers (APPs -  Physician Assistants and Nurse Practitioners) who all work together to provide you with the care you need, when you need it.  We recommend signing up for the patient portal called "MyChart".  Sign up information is provided on this After Visit Summary.  MyChart is used to connect with patients for Virtual Visits (Telemedicine).  Patients are able to view lab/test results, encounter notes, upcoming appointments, etc.  Non-urgent messages can be sent to your provider as well.   To learn more about what you can do with MyChart, go to [email or web address].    Your next appointment:    August   Provider:   You may see Dietrich Pates, MD or one of the following Advanced Practice Providers on your designated Care Team:   Randall An, PA-C  Jacolyn Reedy, PA-C     Other Instructions Thank you for choosing [facility]!

## 2023-08-08 NOTE — Progress Notes (Signed)
 Cardiology Office Note:    Date:  08/08/2023   ID:  [name], DOB 1935-06-02, MRN [id number]  PCP:  [name], FNP  Cardiologist:  Dietrich Pates, MD   Electrophysiologist:  None   Referring MD: [name], FNP   F/U of CAD, HTN and dizziness  History of Present Illness:    Ronald Russell. is a 89 y.o. male with hx of CAD (s/p CABG in June 2019; postop with atrial fibrillation)   Holter monitor in Sept 2019 shows NSR, occasional PACs/PVCs  Again in Aug 2020 SR with PACs  No afib    Echo in July 2020 LVEF normal   The pt also has a hx of HTN,  HL bladder cancer, temporal arteritis, hyperlipidemia  (patient intolerant to statins) The pt has had a long hx of dizziness   Seen by ENT   No cause found   Has also been to balance rehab   2022  Seen by Gregor Hams for med recomm for cholesterol  Main complaints were dizziness  (extensive work up in past)  Agreed to trying PCSK9i The pt called in with headache  BP OK  Worried it was due to Repatha On 8/10 called  Atenolol held due to dizziness   BP high 175/105   Complained of chest pressure  Took atenolol and swimmy headed, unsteady   01/21/21 went to ER with CP and SOB and diaphoresis Blood pressure high  Pt dizzy   Lexiscan myoview  showed no ischemia   Continued on aspirin.   Atenolol stopped    Plan for PRN dilt    The pt called in on day of discharge and said he could not tolerate dilitazem  Hungover feeling   Tight in head  Lips cold    Called in 8/13  Wanted to try magnesium   Called in again   BP high   Wanted to go back on atenolol  May 2023  Pt had [facility] Spotted Fever.   Continued dizziness  Seen in ENT  Dizziness improved over time   I saw the pt in July 2024   Since seen he says he stopped atenolol   Dizziness improved but then HTN "came back with a vengeance"  Went back on it   Still with intermitt dizziness Pt says he is having GI problems   Due to have an EGD this week   He is having diarrhea intermittently.    4 bowel movements today     Diet limited by teeth.   Can't chew solid foods     Prior CV studies:   The following studies were reviewed today:  01/23/21  Defect 1: There is a medium defect of mild severity present in the basal inferior, mid inferior and apical inferior location. This is a low risk study. The left ventricular ejection fraction is hyperdynamic (>65%). Nuclear stress EF: 83%. There was no ST segment deviation noted during stress. No T wave inversion was noted during stress.   No evidence of ischemia. There is a medium defect of mild severity in the inferior wall, present at both rest and stress. Normal wall motion in this area. Given significant extracardiac activity in this area, suspect this is artifact. Low risk study. Change compared to prior study, but patient has had cardiac bypass surgery in the interim.    48 Hr Holter 02/2018 Sinus rhythm   51 to 106 bpm   Average HR 71 bpm Occasional PVC  Occasional PAC Longest pause 1.5 sec Diary entry associated with SR and also SR with  PAC  Pre CABG Dopplers 12/04/17  Final Interpretation: Right Carotid: The extracranial vessels were near-normal with only minimal wall thickening or plaque. Left Carotid: The extracranial vessels were near-normal with only minimal wall thickening or plaque. Vertebrals:  Bilateral vertebral arteries demonstrate antegrade flow. Subclavians: Normal flow hemodynamics were seen in bilateral subclavian arteries. Right Upper Extremity: Doppler waveforms remain within normal limits with compression. Doppler waveforms remain within normal limits with compression. Left Upper Extremity: Doppler waveforms remain within normal limits with compression. Doppler waveforms remain within normal limits with compression.   Cardiac Catheterization 12/02/17 LAD prox 75, mid 100 (R-L collats); D2 75 LCx prox 80, mid 70 RCA mild disease EF 55-65   Nuclear stress test 11/24/17 Intermediate risk stress nuclear  study with a new area of mild ischemia in the distal LAD artery territory. Normal left ventricular regional and global systolic function. EF 85   Event Monitor 08/2016 Sinus rhythm with PACs  Sensed as skips     Echo 09/01/16 EF 60-65, no RWMA, Gr 2 DD   Past Medical History:  Diagnosis Date   Allergy    Anxiety    Bladder cancer (HCC) 2010   Tx with BCG   CAD (coronary artery disease)    LHC 6/19: pLAD 75, mLAD 100, D2 75; pLCx 80, mLCx 70, EF 55-65 >> s/p CABG // Echo 3/18: EF 60-65, Gr 2 DD   Chronic bronchitis (HCC)    "yearly the last 3 yrs" (02/13/2013)   Dysrhythmia    GERD (gastroesophageal reflux disease)    History of blood transfusion 1949   "seeral" (02/13/2013)   HOH (hard of hearing)    Hypertension    Ocular migraine    "not often" (02/13/2013)   Pneumonia    "last time ~ 2 yr ago; had it before that too" (02/13/2013)   Seasonal allergies    Shingles    has neuropathy since it happened in 6/17   Temporal arteritis (HCC)    Thrombocytopenia (HCC) 11/29/2016   Surgical Hx: The patient  has a past surgical history that includes Transurethral resection of bladder tumor (2010 X 3); mastoid tumor removed (Right, 1964); Tonsillectomy (1940's); Skin graft (Right, 1949); Colonoscopy; Vasectomy; Artery Biopsy (Left, 01/09/2013); Cardiovascular stress test (07/2011); Cholecystectomy (N/A, 11/30/2016); LEFT HEART CATH AND CORONARY ANGIOGRAPHY (N/A, 12/02/2017); Coronary artery bypass graft (N/A, 12/07/2017); TEE without cardioversion (N/A, 12/07/2017); Skin graft (Right); Cystoscopy with biopsy (N/A, 04/21/2020); and Ear Drum Replacement.   Current Medications: Current Meds  Medication Sig   acetaminophen (TYLENOL) 500 MG tablet Take 500 mg by mouth as needed.   Alirocumab (PRALUENT) 75 MG/ML SOAJ Inject 1 mL (75 mg total) into the skin every 14 (fourteen) days.   atenolol (TENORMIN) 25 MG tablet TAKE 1/4 TABLET BY MOUTH DAILY, MAY TAKE ADDITIONAL 1/4 TABLET DAILY AS NEEDED FOR  PALPITATIONS   fluocinonide cream (LIDEX) 0.05 % APPLY TO AFFECTED AREA TWICE A DAY   hydrocortisone (ANUSOL-HC) 25 MG suppository PLACE 1 SUPPOSITORY RECTALLY 2 TIMES DAILY.   lansoprazole (PREVACID) 15 MG capsule TAKE 1 CAPSULE (15 MG TOTAL) BY MOUTH DAILY AT 12 NOON.   Potassium 99 MG TABS Take 1 tablet by mouth every other day.   psyllium (METAMUCIL SMOOTH TEXTURE) 58.6 % powder Take 1 packet by mouth 3 (three) times daily.   VITAMIN D PO Take 1 tablet by mouth daily.     Allergies:  Uloric [febuxostat], Cholestyramine, Doxazosin, Doxycycline, Omeprazole, Pantoprazole, Pravastatin, Rosuvastatin, Zofran [ondansetron], Augmentin [amoxicillin-pot clavulanate], Ciprofloxacin, Codeine, Levaquin [levofloxacin], Oxycodone, Tramadol, and Vicodin [hydrocodone-acetaminophen]   Social History   Tobacco Use   Smoking status: Former    Current packs/day: 0.75    Average packs/day: 0.8 packs/day for 3.0 years (2.3 ttl pk-yrs)    Types: Cigarettes   Smokeless tobacco: Never   Tobacco comments:    02/13/2013 "quit smoking age 28"  Vaping Use   Vaping status: Never Used  Substance Use Topics   Alcohol use: No    Comment: 02/13/2013 "probably a pint of whiskey/wk up til I was probably 89 yr old"   Drug use: No     Family Hx: The patient's family history includes Alzheimer's disease in his father; Cancer in his mother, sister, and sister. There is no history of Anemia, Arrhythmia, Asthma, Clotting disorder, Fainting, Heart attack, Heart disease, Heart failure, Hyperlipidemia, Hypertension, or Migraines.  ROS:   Please see the history of present illness.    All other systems reviewed and are negative.   EKGs/Labs/Other Test Reviewed:    EKG:   No new EKG   Recent Labs: 03/21/2023: TSH 3.780 06/16/2023: Hemoglobin 16.2; Platelets 126 07/08/2023: ALT 17; BUN 13; Creatinine, Ser 1.27; Potassium 4.2; Sodium 139   Recent Lipid Panel Lab Results  Component Value Date/Time   CHOL 166 07/08/2023  02:30 PM   TRIG 140 07/08/2023 02:30 PM   HDL 39 (L) 07/08/2023 02:30 PM   CHOLHDL 4.3 07/08/2023 02:30 PM   CHOLHDL 3.3 12/02/2017 12:53 AM   LDLCALC 102 (H) 07/08/2023 02:30 PM    Physical Exam:    VS:  BP 122/70   Pulse 70   Ht 6' (1.829 m)   Wt 189 lb (85.7 kg)   SpO2 97%   BMI 25.63 kg/m     Wt Readings from Last 3 Encounters:  08/08/23 189 lb (85.7 kg)  08/03/23 190 lb (86.2 kg)  07/14/23 191 lb 4.8 oz (86.8 kg)   Gen:   Pt is in NAD  Neck:   JVP not elevated  lungs:   Clear to ausculation    Cardiac RRR S1, S2    No murmurs Abd   Supple   No hepatomegaly Nontender  No masses Ext  No LE edema     2+ pulses         ASSESSMENT & PLAN:    1  CAD   CABG in 2019   Myoview in 2022  Denies CP  2  Dizziness  Continues to complain of dizziness   BP is controlled   Follow   Try to stay hydrated      2  HTN   BP is controlled      4   HL He is erratic with cholesterol meds    LDL was 102 in Jan    With GI symptoms escalating, continued intemitt dizziness will address later in summer   Keep on same meds     Keep up with adequate protein in diet     Signed, Dietrich Pates, MD  08/08/2023 1:15 PM    [facility] [address] [place], [place], [place]  [address] Phone: [phone]; Fax: [phone]

## 2023-08-09 ENCOUNTER — Ambulatory Visit (HOSPITAL_COMMUNITY): Payer: No Typology Code available for payment source | Admitting: Certified Registered"

## 2023-08-09 ENCOUNTER — Encounter (HOSPITAL_COMMUNITY)
Admission: RE | Disposition: A | Discharge status: Home / Self Care | Payer: Self-pay | Source: Ambulatory Visit | Attending: Gastroenterology

## 2023-08-09 ENCOUNTER — Ambulatory Visit (HOSPITAL_COMMUNITY)
Admission: RE | Admit: 2023-08-09 | Discharge: 2023-08-09 | Disposition: A | Discharge status: Home / Self Care | Payer: No Typology Code available for payment source | Source: Ambulatory Visit | Attending: Gastroenterology | Admitting: Gastroenterology

## 2023-08-09 ENCOUNTER — Other Ambulatory Visit: Payer: Self-pay

## 2023-08-09 ENCOUNTER — Encounter (HOSPITAL_COMMUNITY): Payer: Self-pay | Admitting: Gastroenterology

## 2023-08-09 DIAGNOSIS — J42 Unspecified chronic bronchitis: Secondary | ICD-10-CM | POA: Insufficient documentation

## 2023-08-09 DIAGNOSIS — R1011 Right upper quadrant pain: Secondary | ICD-10-CM | POA: Diagnosis not present

## 2023-08-09 DIAGNOSIS — K449 Diaphragmatic hernia without obstruction or gangrene: Secondary | ICD-10-CM | POA: Diagnosis not present

## 2023-08-09 DIAGNOSIS — F418 Other specified anxiety disorders: Secondary | ICD-10-CM | POA: Insufficient documentation

## 2023-08-09 DIAGNOSIS — K2270 Barrett's esophagus without dysplasia: Secondary | ICD-10-CM | POA: Diagnosis not present

## 2023-08-09 DIAGNOSIS — Z9049 Acquired absence of other specified parts of digestive tract: Secondary | ICD-10-CM | POA: Diagnosis not present

## 2023-08-09 DIAGNOSIS — Z955 Presence of coronary angioplasty implant and graft: Secondary | ICD-10-CM | POA: Diagnosis not present

## 2023-08-09 DIAGNOSIS — I10 Essential (primary) hypertension: Secondary | ICD-10-CM | POA: Diagnosis not present

## 2023-08-09 DIAGNOSIS — Z87891 Personal history of nicotine dependence: Secondary | ICD-10-CM

## 2023-08-09 DIAGNOSIS — K209 Esophagitis, unspecified without bleeding: Secondary | ICD-10-CM | POA: Diagnosis not present

## 2023-08-09 DIAGNOSIS — R110 Nausea: Secondary | ICD-10-CM | POA: Diagnosis not present

## 2023-08-09 DIAGNOSIS — Z79899 Other long term (current) drug therapy: Secondary | ICD-10-CM | POA: Insufficient documentation

## 2023-08-09 DIAGNOSIS — Z800 Family history of malignant neoplasm of digestive organs: Secondary | ICD-10-CM | POA: Insufficient documentation

## 2023-08-09 DIAGNOSIS — Z803 Family history of malignant neoplasm of breast: Secondary | ICD-10-CM | POA: Diagnosis not present

## 2023-08-09 DIAGNOSIS — Z8551 Personal history of malignant neoplasm of bladder: Secondary | ICD-10-CM | POA: Insufficient documentation

## 2023-08-09 DIAGNOSIS — K31A14 Gastric intestinal metaplasia without dysplasia, involving the cardia: Secondary | ICD-10-CM | POA: Diagnosis not present

## 2023-08-09 DIAGNOSIS — I2510 Atherosclerotic heart disease of native coronary artery without angina pectoris: Secondary | ICD-10-CM | POA: Diagnosis not present

## 2023-08-09 DIAGNOSIS — K219 Gastro-esophageal reflux disease without esophagitis: Secondary | ICD-10-CM | POA: Diagnosis not present

## 2023-08-09 HISTORY — PX: BIOPSY: SHX5522

## 2023-08-09 HISTORY — PX: ESOPHAGOGASTRODUODENOSCOPY (EGD) WITH PROPOFOL: SHX5813

## 2023-08-09 SURGERY — ESOPHAGOGASTRODUODENOSCOPY (EGD) WITH PROPOFOL
Anesthesia: General

## 2023-08-09 MED ORDER — PHENYLEPHRINE HCL (PRESSORS) 10 MG/ML IV SOLN
INTRAVENOUS | Status: DC | PRN
Start: 2023-08-09 — End: 2023-08-09
  Administered 2023-08-09 (×10): 80 ug via INTRAVENOUS

## 2023-08-09 MED ORDER — PROPOFOL 10 MG/ML IV BOLUS
INTRAVENOUS | Status: DC | PRN
  Administered 2023-08-09: 10 mg via INTRAVENOUS
  Administered 2023-08-09: 50 mg via INTRAVENOUS
  Administered 2023-08-09 (×3): 10 mg via INTRAVENOUS

## 2023-08-09 MED ORDER — LACTATED RINGERS IV SOLN: INTRAVENOUS | Status: DC | PRN

## 2023-08-09 MED ORDER — COLESTIPOL HCL 1 G PO TABS: 2.0000 g | ORAL_TABLET | Freq: Two times a day (BID) | ORAL | 1 refills | Status: DC

## 2023-08-09 MED ORDER — LIDOCAINE HCL (CARDIAC) PF 100 MG/5ML IV SOSY
PREFILLED_SYRINGE | INTRAVENOUS | Status: DC | PRN
  Administered 2023-08-09: 80 mg via INTRAVENOUS

## 2023-08-09 NOTE — Anesthesia Postprocedure Evaluation (Signed)
 Anesthesia Post Note  Patient: Ronald ARMIJO Sr.  Procedure(s) Performed: ESOPHAGOGASTRODUODENOSCOPY (EGD) WITH PROPOFOL BIOPSY  Patient location during evaluation: PACU Anesthesia Type: General Level of consciousness: awake and alert Pain management: pain level controlled Vital Signs Assessment: post-procedure vital signs reviewed and stable Respiratory status: spontaneous breathing, nonlabored ventilation, respiratory function stable and patient connected to nasal cannula oxygen Cardiovascular status: blood pressure returned to baseline and stable Postop Assessment: no apparent nausea or vomiting Anesthetic complications: no   There were no known notable events for this encounter.   Last Vitals:  Vitals:   08/09/23 1203 08/09/23 1314  BP:  (!) 106/58  Pulse: 60 65  Resp: 17 (!) 22  Temp: 36.6 C 36.5 C  SpO2:  97%    Last Pain:  Vitals:   08/09/23 1314  TempSrc: Oral  PainSc: 0-No pain                 Ella Guillotte L Taniya Dasher

## 2023-08-09 NOTE — Discharge Instructions (Signed)
 You are being discharged to home.  Resume your previous diet.  We are waiting for your pathology results.  Your physician has recommended a repeat upper endoscopy for surveillance based on pathology results.  Continue your present medications.  Start colestipol 2 g every day 4 hours apart from other medications.

## 2023-08-09 NOTE — Op Note (Signed)
 [facility] Patient Name: Ronald Russell Procedure Date: 08/09/2023 12:02 PM MRN: [id number] Date of Birth: [birth year] Attending MD: Katrinka Blazing , , [id number] CSN: [id number] Age: 89 Admit Type: Outpatient Procedure:                Upper GI endoscopy Indications:              Abdominal pain in the right upper quadrant,                            Follow-up of Barrett's esophagus, Nausea Providers:                Katrinka Blazing, Nena Polio, RN, Dyann Ruddle Referring MD:              Medicines:                Monitored Anesthesia Care Complications:            No immediate complications. Estimated Blood Loss:     Estimated blood loss: none. Procedure:                Pre-Anesthesia Assessment:                           - Prior to the procedure, a History and Physical                            was performed, and patient medications, allergies                            and sensitivities were reviewed. The patient's                            tolerance of previous anesthesia was reviewed.                           - The risks and benefits of the procedure and the                            sedation options and risks were discussed with the                            patient. All questions were answered and informed                            consent was obtained.                           - ASA Grade Assessment: II - A patient with mild                            systemic disease.                           After obtaining informed consent, the endoscope was                            passed  under direct vision. Throughout the                            procedure, the patient's blood pressure, pulse, and                            oxygen saturations were monitored continuously. The                            [id number]-H190 ([id number]) scope was introduced through the                            mouth, and advanced to the second part of duodenum.                            The upper GI  endoscopy was accomplished without                            difficulty. The patient tolerated the procedure                            well. Scope In: 12:51:48 PM Scope Out: 1:09:35 PM Total Procedure Duration: 0 hours 17 minutes 47 seconds  Findings:      The esophagus and gastroesophageal junction were examined with white       light and narrow band imaging (NBI). There were esophageal mucosal       changes consistent with long-segment Barrett's esophagus, classified as       Barrett's stage C7-M7 per Prague criteria. These changes involved the       mucosa at the upper extent of the gastric folds (35 cm from the       incisors) extending to the Z-line (28 cm from the incisors). Nodularity       was present at 31 cm. The maximum longitudinal extent of these       esophageal mucosal changes was 7 cm in length. Mucosa was biopsied with       a cold forceps for histology in 4 quadrants at intervals of 1 cm. A       total of 8 specimen bottles were sent to pathology (nodule biopsies were       sent separately).      A 5 cm hiatal hernia was present.      The entire examined stomach was normal.      The examined duodenum was normal. Impression:               - Esophageal mucosal changes consistent with                            long-segment Barrett's esophagus, classified as                            Barrett's stage C7-M7 per Prague criteria. Biopsied.                           - 5 cm hiatal hernia.                           -  Normal stomach.                           - Normal examined duodenum. Moderate Sedation:      Per Anesthesia Care Recommendation:           - Discharge patient to home (ambulatory).                           - Resume previous diet.                           - Await pathology results.                           - Repeat upper endoscopy for surveillance based on                            pathology results.                           - Continue present  medications.                           - Srtart colestipol 2 g every day 4 hours apart                            from other medications. Procedure Code(s):        --- Professional ---                           [phone], Esophagogastroduodenoscopy, flexible,                            transoral; with biopsy, single or multiple Diagnosis Code(s):        --- Professional ---                           K22.70, Barrett's esophagus without dysplasia                           K44.9, Diaphragmatic hernia without obstruction or                            gangrene                           R10.11, Right upper quadrant pain                           R11.0, Nausea CPT copyright 2022 American Medical Association. All rights reserved. The codes documented in this report are preliminary and upon coder review may  be revised to meet current compliance requirements. Katrinka Blazing, MD Katrinka Blazing,  08/09/2023 1:22:53 PM This report has been signed electronically. Number of Addenda: 0

## 2023-08-09 NOTE — Anesthesia Preprocedure Evaluation (Addendum)
 Anesthesia Evaluation  Patient identified by MRN, date of birth, ID band Patient awake    Reviewed: Allergy & Precautions, NPO status , Patient's Chart, lab work & pertinent test results, reviewed documented beta blocker date and time   Airway Mallampati: I  TM Distance: >3 FB Neck ROM: Full    Dental  (+) Dental Advisory Given, Partial Upper Temporary bridge upper front teeth.  Comes out easily although glued in.  Has already come out 2 times.  I told him that it would come out for sure during this procedure.:   Pulmonary pneumonia, resolved, COPD, former smoker Chronic bronchitis   Pulmonary exam normal breath sounds clear to auscultation       Cardiovascular hypertension, Pt. on home beta blockers + CAD  Normal cardiovascular exam+ dysrhythmias  Rhythm:Regular Rate:Normal     Neuro/Psych  Headaches PSYCHIATRIC DISORDERS Anxiety Depression     Neuromuscular disease    GI/Hepatic ,GERD  Medicated and Controlled,,  Endo/Other    Renal/GU Renal InsufficiencyRenal disease   Bladder cancer    Musculoskeletal negative musculoskeletal ROS (+)    Abdominal   Peds  Hematology  (+) Blood dyscrasia thrombocytopenia   Anesthesia Other Findings    Monitor shows isolated skips from the upper chambers (atria) No sustained arrhythmia Would try adding low dose diltiazem to see if helps  30 mg   2 times per day  Continue other meds 28/2020 10:16 PM EDT   Echo shows normal pumping functoin of left and right ventricles No significant valve abnormalities       Reproductive/Obstetrics                             Anesthesia Physical Anesthesia Plan  ASA: 3  Anesthesia Plan: General   Post-op Pain Management: Minimal or no pain anticipated   Induction: Intravenous  PONV Risk Score and Plan: 3 and Propofol infusion  Airway Management Planned: Nasal Cannula and Natural Airway  Additional  Equipment: None  Intra-op Plan:   Post-operative Plan:   Informed Consent: I have reviewed the patients History and Physical, chart, labs and discussed the procedure including the risks, benefits and alternatives for the proposed anesthesia with the patient or authorized representative who has indicated his/her understanding and acceptance.     Dental advisory given  Plan Discussed with: CRNA  Anesthesia Plan Comments:         Anesthesia Quick Evaluation

## 2023-08-09 NOTE — Interval H&P Note (Signed)
 History and Physical Interval Note:  08/09/2023 12:44 PM  Ronald Lamas Sr.  has presented today for surgery, with the diagnosis of BARRETTS ESOPHAGUS.  The various methods of treatment have been discussed with the patient and family. After consideration of risks, benefits and other options for treatment, the patient has consented to  Procedure(s) with comments: ESOPHAGOGASTRODUODENOSCOPY (EGD) WITH PROPOFOL (N/A) - 2:15PM;ASA 3 as a surgical intervention.  The patient's history has been reviewed, patient examined, no change in status, stable for surgery.  I have reviewed the patient's chart and labs.  Questions were answered to the patient's satisfaction.     [name]

## 2023-08-09 NOTE — Transfer of Care (Signed)
 Immediate Anesthesia Transfer of Care Note  Patient: Ronald DORITY Sr.  Procedure(s) Performed: ESOPHAGOGASTRODUODENOSCOPY (EGD) WITH PROPOFOL BIOPSY  Patient Location: PACU  Anesthesia Type:General  Level of Consciousness: awake, alert , oriented, and patient cooperative  Airway & Oxygen Therapy: Patient Spontanous Breathing  Post-op Assessment: Report given to RN, Post -op Vital signs reviewed and stable, and Patient moving all extremities X 4  Post vital signs: Reviewed and stable  Last Vitals:  Vitals Value Taken Time  BP 106/58 08/09/23 1314  Temp 36.5 C 08/09/23 1314  Pulse 65 08/09/23 1314  Resp 22 08/09/23 1314  SpO2 97 % 08/09/23 1314    Last Pain:  Vitals:   08/09/23 1314  TempSrc: Oral  PainSc: 0-No pain      Patients Stated Pain Goal: 6 (08/09/23 1157)  Complications: No notable events documented.

## 2023-08-10 LAB — SURGICAL PATHOLOGY

## 2023-08-11 ENCOUNTER — Encounter (INDEPENDENT_AMBULATORY_CARE_PROVIDER_SITE_OTHER): Payer: Self-pay | Admitting: *Deleted

## 2023-08-11 ENCOUNTER — Encounter (HOSPITAL_COMMUNITY): Payer: Self-pay | Admitting: Gastroenterology

## 2023-09-16 ENCOUNTER — Telehealth: Payer: Self-pay | Admitting: Internal Medicine

## 2023-09-16 NOTE — Telephone Encounter (Signed)
 Pt c/o BP issue:  1. What are your last 5 BP readings? 114/51 pulse 53, 117/57 pulse 51, 141/62 pulse 59 after walking  2. Are you having any other symptoms (ex. Dizziness, headache, blurred vision, passed out)?  Little dizziness at this time, and no energy,  3. What is your medication issue? Blood pressure is running low

## 2023-09-16 NOTE — Telephone Encounter (Signed)
 Pt called to report that he had lost about 18 lbs in 3 months due to dietary issues from dental work.   He has been having some weakness and dizziness and thinks it is coming from taking the Atenolol 25 mg 1/4 tablet at bedtime.   He says it HR has been dripping to the upper 40's and 50's.... his VS:  114/51 pulse 53, 117/57 pulse 51, 141/62 pulse 59 after walking    After his last OV 2/07/19/23... he stopped his Atenolol fro several days and he says his HR shot up and was hard to get back down... he will try taking is every other day for now per his request.. he strongly declines only taking it PRN.   He will keep a log of his BP and HR and let us know in a couple of weeks how he is doing.   He will also be sure to hydrate well.

## 2023-09-27 ENCOUNTER — Other Ambulatory Visit: Payer: Self-pay

## 2023-09-27 ENCOUNTER — Inpatient Hospital Stay (HOSPITAL_BASED_OUTPATIENT_CLINIC_OR_DEPARTMENT_OTHER)
Admission: EM | Admit: 2023-09-27 | Discharge: 2023-09-28 | DRG: 303 | Disposition: A | Discharge status: Home / Self Care | Attending: Cardiology | Admitting: Cardiology

## 2023-09-27 ENCOUNTER — Encounter (HOSPITAL_BASED_OUTPATIENT_CLINIC_OR_DEPARTMENT_OTHER): Payer: Self-pay | Admitting: Emergency Medicine

## 2023-09-27 ENCOUNTER — Emergency Department (HOSPITAL_BASED_OUTPATIENT_CLINIC_OR_DEPARTMENT_OTHER): Admitting: Radiology

## 2023-09-27 ENCOUNTER — Telehealth: Payer: Self-pay | Admitting: Internal Medicine

## 2023-09-27 DIAGNOSIS — F17210 Nicotine dependence, cigarettes, uncomplicated: Secondary | ICD-10-CM | POA: Diagnosis present

## 2023-09-27 DIAGNOSIS — E785 Hyperlipidemia, unspecified: Secondary | ICD-10-CM | POA: Diagnosis present

## 2023-09-27 DIAGNOSIS — I25110 Atherosclerotic heart disease of native coronary artery with unstable angina pectoris: Principal | ICD-10-CM | POA: Diagnosis present

## 2023-09-27 DIAGNOSIS — I200 Unstable angina: Secondary | ICD-10-CM | POA: Diagnosis not present

## 2023-09-27 DIAGNOSIS — K449 Diaphragmatic hernia without obstruction or gangrene: Secondary | ICD-10-CM | POA: Diagnosis not present

## 2023-09-27 DIAGNOSIS — Z885 Allergy status to narcotic agent status: Secondary | ICD-10-CM

## 2023-09-27 DIAGNOSIS — R079 Chest pain, unspecified: Secondary | ICD-10-CM

## 2023-09-27 DIAGNOSIS — Z8551 Personal history of malignant neoplasm of bladder: Secondary | ICD-10-CM | POA: Diagnosis not present

## 2023-09-27 DIAGNOSIS — I452 Bifascicular block: Secondary | ICD-10-CM | POA: Diagnosis present

## 2023-09-27 DIAGNOSIS — I700 Atherosclerosis of aorta: Secondary | ICD-10-CM | POA: Diagnosis not present

## 2023-09-27 DIAGNOSIS — I10 Essential (primary) hypertension: Secondary | ICD-10-CM | POA: Diagnosis present

## 2023-09-27 DIAGNOSIS — R072 Precordial pain: Principal | ICD-10-CM

## 2023-09-27 DIAGNOSIS — Z882 Allergy status to sulfonamides status: Secondary | ICD-10-CM | POA: Diagnosis not present

## 2023-09-27 DIAGNOSIS — Z881 Allergy status to other antibiotic agents status: Secondary | ICD-10-CM

## 2023-09-27 DIAGNOSIS — Z951 Presence of aortocoronary bypass graft: Secondary | ICD-10-CM

## 2023-09-27 DIAGNOSIS — Z888 Allergy status to other drugs, medicaments and biological substances status: Secondary | ICD-10-CM

## 2023-09-27 DIAGNOSIS — F419 Anxiety disorder, unspecified: Secondary | ICD-10-CM | POA: Diagnosis present

## 2023-09-27 DIAGNOSIS — R001 Bradycardia, unspecified: Secondary | ICD-10-CM | POA: Diagnosis present

## 2023-09-27 DIAGNOSIS — Z79899 Other long term (current) drug therapy: Secondary | ICD-10-CM

## 2023-09-27 DIAGNOSIS — J42 Unspecified chronic bronchitis: Secondary | ICD-10-CM | POA: Diagnosis present

## 2023-09-27 DIAGNOSIS — I771 Stricture of artery: Secondary | ICD-10-CM | POA: Diagnosis not present

## 2023-09-27 DIAGNOSIS — I440 Atrioventricular block, first degree: Secondary | ICD-10-CM | POA: Diagnosis present

## 2023-09-27 DIAGNOSIS — K219 Gastro-esophageal reflux disease without esophagitis: Secondary | ICD-10-CM | POA: Diagnosis present

## 2023-09-27 DIAGNOSIS — I209 Angina pectoris, unspecified: Secondary | ICD-10-CM | POA: Diagnosis present

## 2023-09-27 DIAGNOSIS — I4891 Unspecified atrial fibrillation: Secondary | ICD-10-CM | POA: Diagnosis present

## 2023-09-27 DIAGNOSIS — K2270 Barrett's esophagus without dysplasia: Secondary | ICD-10-CM | POA: Diagnosis present

## 2023-09-27 DIAGNOSIS — Z820 Family history of epilepsy and other diseases of the nervous system: Secondary | ICD-10-CM

## 2023-09-27 LAB — CBC
HCT: 45.8 % (ref 39.0–52.0)
HCT: 46.9 % (ref 39.0–52.0)
Hemoglobin: 15.7 g/dL (ref 13.0–17.0)
Hemoglobin: 16.2 g/dL (ref 13.0–17.0)
MCH: 32.2 pg (ref 26.0–34.0)
MCH: 32.3 pg (ref 26.0–34.0)
MCHC: 34.3 g/dL (ref 30.0–36.0)
MCHC: 34.5 g/dL (ref 30.0–36.0)
MCV: 94 fL (ref 80.0–100.0)
MCV: 93.6 fL (ref 80.0–100.0)
Platelets: 126 10*3/uL — ABNORMAL LOW (ref 150–400)
Platelets: 122 10*3/uL — ABNORMAL LOW (ref 150–400)
RBC: 4.87 MIL/uL (ref 4.22–5.81)
RBC: 5.01 MIL/uL (ref 4.22–5.81)
RDW: 13.3 % (ref 11.5–15.5)
RDW: 13.2 % (ref 11.5–15.5)
WBC: 5.3 10*3/uL (ref 4.0–10.5)
WBC: 6.4 10*3/uL (ref 4.0–10.5)
nRBC: 0 % (ref 0.0–0.2)
nRBC: 0 % (ref 0.0–0.2)

## 2023-09-27 LAB — CREATININE, SERUM
Creatinine, Ser: 1.29 mg/dL — ABNORMAL HIGH (ref 0.61–1.24)
GFR, Estimated: 54 mL/min — ABNORMAL LOW (ref >60)

## 2023-09-27 LAB — BASIC METABOLIC PANEL WITH GFR
Anion gap: 9 (ref 5–15)
BUN: 14 mg/dL (ref 8–23)
CO2: 23 mmol/L (ref 22–32)
Calcium: 9.4 mg/dL (ref 8.9–10.3)
Chloride: 106 mmol/L (ref 98–111)
Creatinine, Ser: 1.19 mg/dL (ref 0.61–1.24)
GFR, Estimated: 59 mL/min — ABNORMAL LOW (ref >60)
Glucose, Bld: 114 mg/dL — ABNORMAL HIGH (ref 70–99)
Potassium: 3.8 mmol/L (ref 3.5–5.1)
Sodium: 138 mmol/L (ref 135–145)

## 2023-09-27 LAB — TROPONIN I (HIGH SENSITIVITY)
Troponin I (High Sensitivity): 7 ng/L (ref <18)
Troponin I (High Sensitivity): 8 ng/L (ref <18)

## 2023-09-27 MED ORDER — ATENOLOL 25 MG PO TABS: 12.5000 mg | ORAL_TABLET | Freq: Every day | ORAL | Status: DC

## 2023-09-27 MED ORDER — FAMOTIDINE 20 MG PO TABS
20.0000 mg | ORAL_TABLET | Freq: Two times a day (BID) | ORAL | Status: DC
  Administered 2023-09-28: 20 mg via ORAL
  Filled 2023-09-27 (×2): qty 1

## 2023-09-27 MED ORDER — HEPARIN SODIUM (PORCINE) 5000 UNIT/ML IJ SOLN
5000.0000 [IU] | Freq: Three times a day (TID) | INTRAMUSCULAR | Status: DC
  Administered 2023-09-27 – 2023-09-28 (×2): 5000 [IU] via SUBCUTANEOUS
  Filled 2023-09-27 (×2): qty 1

## 2023-09-27 MED ORDER — ACETAMINOPHEN 325 MG PO TABS: 650.0000 mg | ORAL_TABLET | ORAL | Status: DC | PRN

## 2023-09-27 MED ORDER — NITROGLYCERIN 0.4 MG SL SUBL: 0.4000 mg | SUBLINGUAL_TABLET | SUBLINGUAL | Status: DC | PRN

## 2023-09-27 MED ORDER — CHOLECALCIFEROL 10 MCG (400 UNIT) PO TABS
400.0000 [IU] | ORAL_TABLET | Freq: Every day | ORAL | Status: DC
  Administered 2023-09-28: 400 [IU] via ORAL
  Filled 2023-09-27: qty 1

## 2023-09-27 MED ORDER — ASPIRIN 81 MG PO CHEW
324.0000 mg | CHEWABLE_TABLET | Freq: Once | ORAL | Status: AC
  Administered 2023-09-27: 324 mg via ORAL
  Filled 2023-09-27: qty 4

## 2023-09-27 MED ORDER — PANTOPRAZOLE SODIUM 20 MG PO TBEC
20.0000 mg | DELAYED_RELEASE_TABLET | Freq: Two times a day (BID) | ORAL | Status: DC
Start: 2023-09-28 — End: 2023-09-28
  Filled 2023-09-27: qty 1

## 2023-09-27 MED ORDER — ASPIRIN 81 MG PO TBEC
81.0000 mg | DELAYED_RELEASE_TABLET | Freq: Every day | ORAL | Status: DC
  Administered 2023-09-28: 81 mg via ORAL
  Filled 2023-09-27: qty 1
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
## 2023-09-27 NOTE — Telephone Encounter (Signed)
 Pt c/o of Chest Pain: STAT if active (IN THIS MOMENT) CP, including tightness, pressure, jaw pain, shoulder/upper arm/back pain, SOB, nausea, and vomiting.  1. Are you having CP right now (tightness, pressure, or discomfort)? Yes  2. Are you experiencing any other symptoms (ex. SOB, nausea, vomiting, sweating)? A little SOB, "I always feel lightheaded", a very slight headache and feeling anxious.  3. How long have you been experiencing CP? About 2 to 3 days   4. Is your CP continuous or coming and going? Coming and going   5. Have you taken Nitroglycerin? No  6. If CP returns before callback, please consider calling 911. ?

## 2023-09-27 NOTE — ED Provider Notes (Signed)
 [place] [facility] DE[facility]MENT AT [facility] Provider Note   CSN: [id number] Arrival date & time: 09/27/23  1307     History  Chief Complaint  Patient presents with   Chest Pain    Ronald CAIAZZO Sr. is a 89 y.o. male.  Patient with history of CAD (s/p CABG in June 2019), echo in July 2020 LVEF normal, HTN, bladder cancer, temporal arteritis, hyperlipidemia, Barrett's esophagus most recent endoscopy February 2025--presents to the emergency ferment today for evaluation of chest pain.  Patient describes 3 days of intermittent tightness in the mid chest like he has wearing a shirt that is too tight.  Symptoms can last for several hours at a time.  He has associated shortness of breath with this.  Currently symptoms are resolved.  No lightheadedness or syncope.  No diaphoresis or vomiting.  Patient used a riding mower yesterday for about an hour without any issues.  He does report some similarity to symptoms he was having prior to his heart surgery in 2019.        Home Medications Prior to Admission medications   Medication Sig Start Date End Date Taking? Authorizing Provider  prochlorperazine (COMPAZINE) 10 MG tablet Take 1 tablet (10 mg total) by mouth every 8 (eight) hours as needed for nausea or vomiting. Patient not taking: Reported on 08/08/2023 07/18/23   Castaneda Mayorga, Bearl Limes, MD  acetaminophen (TYLENOL) 500 MG tablet Take 500 mg by mouth as needed.    [provider]  Alirocumab (PRALUENT) 75 MG/ML SOAJ Inject 1 mL (75 mg total) into the skin every 14 (fourteen) days. 01/19/23   Elmyra Haggard, MD  atenolol (TENORMIN) 25 MG tablet Take 6.25 mg by mouth daily. At bedtime    [provider]  colestipol (COLESTID) 1 g tablet Take 2 tablets (2 g total) by mouth 2 (two) times daily. 08/09/23   Urban Garden, MD  famotidine (PEPCID) 20 MG tablet TAKE 1 TABLET BY MOUTH TWICE A DAY Patient not taking: Reported on 08/08/2023 04/12/23   Galvin Jules, FNP  fluocinonide cream (LIDEX) 0.05 % APPLY TO AFFECTED AREA TWICE A DAY 03/04/23   Gaylyn Keas, Mary-Margaret, FNP  hydrocortisone (ANUSOL-HC) 25 MG suppository PLACE 1 SUPPOSITORY RECTALLY 2 TIMES DAILY. 06/17/22   Gaylyn Keas, Mary-Margaret, FNP  lansoprazole (PREVACID) 15 MG capsule TAKE 1 CAPSULE (15 MG TOTAL) BY MOUTH DAILY AT 12 NOON. 12/09/22   Galvin Jules, FNP  Potassium 99 MG TABS Take 1 tablet by mouth every other day.    [provider]  psyllium (METAMUCIL SMOOTH TEXTURE) 58.6 % powder Take 1 packet by mouth 3 (three) times daily. 07/08/23   Galvin Jules, FNP  VITAMIN D PO Take 1 tablet by mouth daily.    [provider]      Allergies    Uloric [febuxostat], Cholestyramine, Doxazosin, Doxycycline, Omeprazole, Pantoprazole, Pravastatin, Rosuvastatin, Zofran [ondansetron], Augmentin [amoxicillin-pot clavulanate], Ciprofloxacin, Codeine, Levaquin [levofloxacin], Oxycodone, Tramadol, and Vicodin [hydrocodone-acetaminophen]    Review of Systems   Review of Systems  Physical Exam Updated Vital Signs BP (!) 165/65 (BP Location: Right Arm)   Pulse 62   Temp 97.6 F (36.4 C)   Resp 18   SpO2 96%   Physical Exam Vitals and nursing note reviewed.  Constitutional:      Appearance: He is well-developed. He is not diaphoretic.  HENT:     Head: Normocephalic and atraumatic.     Mouth/Throat:     Mouth: Mucous membranes are not  dry.  Eyes:     Conjunctiva/sclera: Conjunctivae normal.  Neck:     Vascular: Normal carotid pulses. No carotid bruit or JVD.     Trachea: Trachea normal. No tracheal deviation.  Cardiovascular:     Rate and Rhythm: Normal rate and regular rhythm.     Pulses: No decreased pulses.          Radial pulses are 2+ on the right side and 2+ on the left side.     Heart sounds: Normal heart sounds, S1 normal and S2 normal. Heart sounds not distant. No murmur heard. Pulmonary:     Effort: Pulmonary effort is normal. No respiratory distress.      Breath sounds: Normal breath sounds. No wheezing.  Chest:     Chest wall: No tenderness.  Abdominal:     General: Bowel sounds are normal.     Palpations: Abdomen is soft.     Tenderness: There is no abdominal tenderness. There is no guarding or rebound.  Musculoskeletal:     Cervical back: Normal range of motion and neck supple. No muscular tenderness.     Right lower leg: No edema.     Left lower leg: No edema.  Skin:    General: Skin is warm and dry.     Coloration: Skin is not pale.  Neurological:     Mental Status: He is alert. Mental status is at baseline.  Psychiatric:        Mood and Affect: Mood normal.     ED Results / Procedures / Treatments   Labs (all labs ordered are listed, but only abnormal results are displayed) Labs Reviewed  CBC - Abnormal; Notable for the following components:      Result Value   Platelets 126 (*)    All other components within normal limits  BASIC METABOLIC PANEL WITH GFR  TROPONIN I (HIGH SENSITIVITY)    EKG EKG Interpretation Date/Time:  Tuesday September 27 2023 13:19:08 EDT Ventricular Rate:  59 PR Interval:  237 QRS Duration:  136 QT Interval:  439 QTC Calculation: 435 R Axis:   -55  Text Interpretation: Sinus rhythm Prolonged PR interval RBBB and LAFB Confirmed by Rosealee Concha ([id number]) on 09/27/2023 1:33:21 PM  Radiology No results found.  Procedures Procedures    Medications Ordered in ED Medications  aspirin chewable tablet 324 mg (has no administration in time range)    ED Course/ Medical Decision Making/ A&P    Patient seen and examined. History obtained directly from patient.   Labs/EKG: Ordered CBC, BMP, troponin, EKG as above.  Imaging: Ordered chest x-ray.  Medications/Fluids: Ordered: Aspirin  Most recent vital signs reviewed and are as follows: BP (!) 165/65 (BP Location: Right Arm)   Pulse 62   Temp 97.6 F (36.4 C)   Resp 18   SpO2 96%   Initial impression: Chest pain in patient with known  coronary artery disease.  4:45 PM Reassessment performed. Patient appears stable on several rechecks.  No recurrent pain.  Labs personally reviewed and interpreted including: CBC with slightly low platelets 126 otherwise unremarkable; BMP unremarkable; first troponin normal at 7.  EKG without any concerning findings as above.  Imaging personally visualized and interpreted including: Chest x-ray, no signs of pneumothorax or consolidation.  Awaiting radiology read.  Reviewed pertinent lab work and imaging with patient at bedside. Questions answered.   Most current vital signs reviewed and are as follows: BP (!) 153/73   Pulse (!) 57   Temp 97.6  F (36.4 C)   Resp 14   SpO2 98%   Plan: Admit to hospital.   I discussed case with Dr. Adrain Alar and consulted cardiology, Dr. Paulita Boss.  We discussed 2 options.  The first being admission for observation, consideration for further testing including catheterization.  Also discussed option of second troponin, repeat EKG and if no change in lateralizing findings on EKG, discharged with close outpatient follow-up.  I went and discussed these options with patient and family at bedside.  After discussion of risks and benefits, patient agrees to be admitted.  Bed request placed.  Currently awaiting second troponin.                                  Medical Decision Making Amount and/or Complexity of Data Reviewed Labs: ordered. Radiology: ordered.  Risk OTC drugs. Decision regarding hospitalization.   Patient with history of CABG with several days of intermittent chest tightness.  Unclear etiology at this time, however unstable angina is a potential etiology.  Patient does have some GI history as well including Barrett's esophagus.  Given high risk patient, plan for admission for further monitoring and evaluation.  Low concern for dissection, PE at this time.        Final Clinical Impression(s) / ED Diagnoses Final diagnoses:   Precordial pain  Sinus bradycardia    Rx / DC Orders ED Discharge Orders     None         Lyna Sandhoff, PA-C 09/27/23 1649    Rosealee Concha, MD 09/28/23 1335

## 2023-09-27 NOTE — ED Notes (Signed)
Report given to receiving RN Michelle.

## 2023-09-27 NOTE — Telephone Encounter (Signed)
 Received a call from patient he stated he has been having chest tightness off and on all week.He is presently having chest tightness rates tightness # 3.Advised he needs to go to ED to be evaluated.Stated his wife will take him to [facility] ED.I will make Dr.Ross aware.

## 2023-09-27 NOTE — ED Triage Notes (Signed)
 Chest pain, tightness Sob Some diaphoresis yesterday Started 3 days ago, getting a lot worse today  Similar feeling when he had heart surgery

## 2023-09-28 ENCOUNTER — Other Ambulatory Visit: Payer: Self-pay

## 2023-09-28 ENCOUNTER — Inpatient Hospital Stay (HOSPITAL_COMMUNITY)

## 2023-09-28 DIAGNOSIS — I200 Unstable angina: Secondary | ICD-10-CM | POA: Diagnosis not present

## 2023-09-28 DIAGNOSIS — R079 Chest pain, unspecified: Secondary | ICD-10-CM

## 2023-09-28 DIAGNOSIS — I10 Essential (primary) hypertension: Secondary | ICD-10-CM

## 2023-09-28 LAB — ECHOCARDIOGRAM COMPLETE
AR max vel: 3.53 cm2
AV Area VTI: 4.76 cm2
AV Area mean vel: 3.62 cm2
AV Mean grad: 2 mmHg
AV Peak grad: 3 mmHg
Ao pk vel: 0.87 m/s
Area-P 1/2: 2.29 cm2
Calc EF: 69.9 %
Height: 72 in
MV VTI: 3.89 cm2
S' Lateral: 2.4 cm
Single Plane A2C EF: 59.9 %
Single Plane A4C EF: 79.1 %
Weight: 2910.4 [oz_av]

## 2023-09-28 LAB — BASIC METABOLIC PANEL WITH GFR
Anion gap: 7 (ref 5–15)
BUN: 9 mg/dL (ref 8–23)
CO2: 26 mmol/L (ref 22–32)
Calcium: 8.9 mg/dL (ref 8.9–10.3)
Chloride: 107 mmol/L (ref 98–111)
Creatinine, Ser: 1.24 mg/dL (ref 0.61–1.24)
GFR, Estimated: 56 mL/min — ABNORMAL LOW (ref >60)
Glucose, Bld: 94 mg/dL (ref 70–99)
Potassium: 3.8 mmol/L (ref 3.5–5.1)
Sodium: 140 mmol/L (ref 135–145)

## 2023-09-28 LAB — PROTIME-INR
INR: 1.1 (ref 0.8–1.2)
Prothrombin Time: 14.1 s (ref 11.4–15.2)

## 2023-09-28 LAB — NM MYOCAR MULTI W/SPECT W/WALL MOTION / EF
Base ST Depression (mm): 0 mm
Estimated workload: 0
Exercise duration (min): 5 min
Exercise duration (sec): 18 s
MPHR: 133 {beats}/min
Nuc Stress EF: 79 %
Peak HR: 90 {beats}/min
Percent HR: 67 %
Rest HR: 62 {beats}/min
ST Depression (mm): 0 mm
TID: 1.04

## 2023-09-28 LAB — MAGNESIUM: Magnesium: 2 mg/dL (ref 1.7–2.4)

## 2023-09-28 LAB — BRAIN NATRIURETIC PEPTIDE: B Natriuretic Peptide: 97.5 pg/mL (ref 0.0–100.0)

## 2023-09-28 SURGERY — LEFT HEART CATH AND CORS/GRAFTS ANGIOGRAPHY
Anesthesia: LOCAL

## 2023-09-28 MED ORDER — ISOSORBIDE MONONITRATE ER 30 MG PO TB24: 30.0000 mg | ORAL_TABLET | Freq: Every day | ORAL | 6 refills | Status: DC

## 2023-09-28 MED ORDER — TECHNETIUM TC 99M TETROFOSMIN IV KIT
10.2000 | PACK | Freq: Once | INTRAVENOUS | Status: AC | PRN
  Administered 2023-09-28: 10.2 via INTRAVENOUS

## 2023-09-28 MED ORDER — EZETIMIBE 10 MG PO TABS
10.0000 mg | ORAL_TABLET | Freq: Every day | ORAL | Status: DC
  Filled 2023-09-28: qty 1

## 2023-09-28 MED ORDER — TECHNETIUM TC 99M TETROFOSMIN IV KIT
30.2000 | PACK | Freq: Once | INTRAVENOUS | Status: AC | PRN
  Administered 2023-09-28: 30.2 via INTRAVENOUS

## 2023-09-28 MED ORDER — ASPIRIN 81 MG PO TBEC: 81.0000 mg | DELAYED_RELEASE_TABLET | Freq: Every day | ORAL | 12 refills | Status: DC

## 2023-09-28 MED ORDER — ASPIRIN 81 MG PO CHEW: 81.0000 mg | CHEWABLE_TABLET | ORAL | Status: DC

## 2023-09-28 MED ORDER — ATENOLOL 25 MG PO TABS
12.5000 mg | ORAL_TABLET | Freq: Every day | ORAL | Status: DC
  Administered 2023-09-28: 6.25 mg via ORAL
  Filled 2023-09-28 (×2): qty 0.5

## 2023-09-28 MED ORDER — REGADENOSON 0.4 MG/5ML IV SOLN
INTRAVENOUS | Status: AC
  Filled 2023-09-28: qty 5

## 2023-09-28 MED ORDER — SODIUM CHLORIDE 0.9 % WEIGHT BASED INFUSION: 3.0000 mL/kg/h | INTRAVENOUS | Status: AC

## 2023-09-28 MED ORDER — REGADENOSON 0.4 MG/5ML IV SOLN
0.4000 mg | Freq: Once | INTRAVENOUS | Status: AC
  Administered 2023-09-28: 0.4 mg via INTRAVENOUS

## 2023-09-28 MED ORDER — NITROGLYCERIN 0.4 MG SL SUBL: 0.4000 mg | SUBLINGUAL_TABLET | SUBLINGUAL | 2 refills | Status: AC | PRN

## 2023-09-28 MED ORDER — SODIUM CHLORIDE 0.9 % WEIGHT BASED INFUSION: 1.0000 mL/kg/h | INTRAVENOUS | Status: DC

## 2023-09-28 NOTE — TOC CM/SW Note (Signed)
 Transition of Care [facility]) - Inpatient Brief Assessment   Patient Details  Name: Ronald HOSICK Sr. MRN: [id number] Date of Birth: 07/03/1934  [facility] of [facility]) CM/SW Contact:    Arron Big, LCSWA Phone Number: 09/28/2023, 2:54 PM   Clinical Narrative: Patient reports he is from home with wife. Patient has the following DME at home: cane, walker. Patient confirmed he has a PCP and uses [facility] in [place]. Patient stated he has previously had HH with [facility]. Patient stated he has reliable transportation.   TOC will continue to follow as needed.   Transition of Care Asessment: Insurance and Status: Insurance coverage has been reviewed Patient has primary care physician: Yes Home environment has been reviewed: Home with wife Prior level of function:: Independent Prior/Current Home Services: No current home services Social Drivers of Health Review: SDOH reviewed no interventions necessary Readmission risk has been reviewed: No Transition of care needs: no transition of care needs at this time

## 2023-09-28 NOTE — Progress Notes (Addendum)
   Patient Name: CECILIA NISHIKAWA Sr. Date of Encounter: 09/28/2023 [facility] Cardiologist: Ola Berger, MD   Interval Summary  .    No chest pain overnight Blood pressure fairly well controlled Echocardiogram with preserved LVEF  Vital Signs .    Vitals:   09/27/23 2055 09/27/23 2105 09/28/23 0100 09/28/23 0512  BP: (!) 167/93  (!) 140/72 117/70  Pulse: 71  65 61  Resp: 17  13 16   Temp: 98.2 F (36.8 C)  97.7 F (36.5 C) (!) 97.3 F (36.3 C)  TempSrc: Oral  Oral Oral  SpO2: 96% 94% 94% 96%  Weight: 85.3 kg   82.5 kg  Height: 6' (1.829 m)       Intake/Output Summary (Last 24 hours) at 09/28/2023 0827 Last data filed at 09/28/2023 0054 Gross per 24 hour  Intake --  Output 1425 ml  Net -1425 ml      09/28/2023    5:12 AM 09/27/2023    8:55 PM 08/09/2023   11:57 AM  Last 3 Weights  Weight (lbs) 181 lb 14.4 oz 188 lb 0.8 oz 187 lb 6.3 oz  Weight (kg) 82.509 kg 85.3 kg 85 kg      Telemetry/ECG    EKG 09/28/2023: - Personally Reviewed Sinus bradycardia with 1st degree A-V block Low voltage QRS Right bundle branch block Left anterior fascicular block Cannot rule out Inferior infarct (masked by fascicular block?) , age undetermined Abnormal ECG  Physical Exam .   Physical Exam Vitals and nursing note reviewed.  Constitutional:      General: He is not in acute distress. Neck:     Vascular: No JVD.  Cardiovascular:     Rate and Rhythm: Normal rate and regular rhythm.     Heart sounds: Normal heart sounds. No murmur heard. Pulmonary:     Effort: Pulmonary effort is normal.     Breath sounds: Normal breath sounds. No wheezing or rales.  Musculoskeletal:     Right lower leg: No edema.     Left lower leg: No edema.      Assessment & Plan .     89 year old male with hypertension, hyperlipidemia (intolerant to statins), CAD s/p CABGX3 (LIMA-LAD, SVG-D1, SVG-OM 2) in 2019, postop Afib,  temporal arteritis and bladder cancer, admitted w/chest pain.    Chest pain: Concerning for unstable agnina, possibly precipitated by emotional stress or blood pressure spikes. EKG with no acute ischemia, trops negative, LVEF preserved. Discussed invasive vs conservative approach with the patient. He prefers conservative approach. Will obtain pharmacological nuclear stress test today. Will also obtain renal artery duplex given blood pressure spikes that may have led to chest pain. Continue aspirin, without heparin. LDL erratic, currently not on statin-reportedly intolerant in the past. He is on Praluent at baseline. Continue atenolol 25 mg daily.  Hypertension: Barring spikes of blood pressure on admission, but controlled. Will check renal artery duplex to rule out renal artery stenosis.  For questions or updates, please contact [facility] Please consult [email or web address] for contact info under        Signed, Cody Das, MD

## 2023-09-28 NOTE — Plan of Care (Signed)
 Pt vital signs and assessment unremarkable pt has had no chest pain

## 2023-09-28 NOTE — Progress Notes (Signed)
  Echocardiogram 2D Echocardiogram has been performed.  Donavan Kerlin L Blaine Hari RDCS 09/28/2023, 8:18 AM

## 2023-09-28 NOTE — Progress Notes (Signed)
 D/c Tele and IV. Went over AVS with pt and his wife and all questions were addressed.   Oral Billings, RN

## 2023-09-28 NOTE — Progress Notes (Addendum)
     Elihu Grumet Sr. presented for a lexi nuclear stress test today.  No immediate complications.  Stress imaging is pending at this time.  Preliminary EKG findings may be listed in the chart, but the stress test result will not be finalized until perfusion imaging is complete.  Had mild complaints of flushness and head pressure but otherwise no significant symptoms.   Burnetta Cart, PA-C  09/28/2023, 1:28 PM

## 2023-09-28 NOTE — Progress Notes (Signed)
 Mobility Specialist Progress Note:   09/28/23 1145  Mobility  Activity Ambulated independently in hallway  Level of Assistance Modified independent, requires aide device or extra time  Assistive Device None  Distance Ambulated (ft) 500 ft  Activity Response Tolerated well  Mobility Referral Yes  Mobility visit 1 Mobility  Mobility Specialist Start Time (ACUTE ONLY) 1145  Mobility Specialist Stop Time (ACUTE ONLY) 1200  Mobility Specialist Time Calculation (min) (ACUTE ONLY) 15 min   Pt agreeable to mobility session. Required no physical assistance throughout ambulation. VSS on RA. Back in bed with all needs met.  Oneda Big Mobility Specialist Please contact via SecureChat or  Rehab office at [phone]

## 2023-09-28 NOTE — Discharge Summary (Signed)
 Discharge Summary    Patient ID: Ronald MACNAUGHTON Sr. MRN: [id number]; DOB: [birth year]  Admit date: 09/27/2023 Discharge date: 09/28/2023  PCP:  Galvin Jules, FNP   [facility] Providers Cardiologist:  Ola Berger, MD   {  Discharge Diagnoses    Principal Problem:   Chest pain of uncertain etiology Active Problems:   HTN (hypertension), benign   S/P CABG x 3    Diagnostic Studies/Procedures    Vascular renal ultrasound 09/28/2023 Renal:    Right: No evidence of right renal artery stenosis. Normal right         Resisitive Index. Normal size right kidney.  Left:  No evidence of left renal artery stenosis. Normal left         Resistive Index. Normal size of left kidney. Cyst(s) noted.         measuring 2.5x2.4 xm.   Lexiscan 09/28/2023 Baseline ECG is abnormal. ECG rhythm shows normal sinus rhythm. ECG demonstrates first-degree AV block, left anterior fascicular block and right bundle branch block.   A pharmacological stress test was performed using IV Lexiscan 0.4mg  over 10 seconds performed without concurrent submaximal exercise. The patient reported headaches and flushing during the stress test. Symptoms began during stress and ended during stress. Normal blood pressure and normal heart rate response noted during stress. Heart rate recovery was normal.   No ST deviation was noted from baseline EKG. Arrhythmias during stress: rare PACs. ECG was interpretable and without significant changes. The ECG was not diagnostic due to pharmacologic protocol.   LV perfusion is normal. There is no evidence of ischemia. There is no evidence of infarction.   Left ventricular function is normal. Nuclear stress EF: 79%. The left ventricular ejection fraction is hyperdynamic (>65%). End diastolic cavity size is normal. End systolic cavity size is normal. No evidence of transient ischemic dilation (TID) noted.   The study is normal. The study is low risk.   Echocardiogram 09/28/2023 1.  Left ventricular ejection fraction, by estimation, is 70 to 75%. The  left ventricle has hyperdynamic function. The left ventricle has no  regional wall motion abnormalities. Left ventricular diastolic parameters  are indeterminate.   2. Right ventricular systolic function is mildly reduced. The right  ventricular size is mildly enlarged.   3. The mitral valve is normal in structure. No evidence of mitral valve  regurgitation. No evidence of mitral stenosis.   4. The aortic valve is tricuspid. Aortic valve regurgitation is not  visualized. No aortic stenosis is present.   5. The inferior vena cava is normal in size with greater than 50%  respiratory variability, suggesting right atrial pressure of 3 mmHg.  _____________   History of Present Illness     Ronald Yapp. is a 89 y.o. male with CAD status post CABG June 2019, postoperative atrial fibrillation with no recurrences, hypertension, hyperlipidemia, statin intolerant, bladder cancer, temporal arteritis.  Has history of coronary artery disease as above.  His last Lexiscan was in March 2024 that did not demonstrate any significant findings.  History of postoperative atrial fibrillation with no documented recurrences on heart monitors.  He is very sensitive to medications and has been intolerant to various things.  On PCSK9 inhibitor currently due to statin intolerance.  Furthermore he has had recurrent issues with dizziness that has been intermittent for several years now.  For that reason he has been intermittently on atenolol.  Did not tolerate diltiazem.  Patient was admitted due to complaints of  bandlike chest discomfort with DOE.  This was in the setting of significant emotional stress.  This pain has been waxing and waning for the past few days that would last several hours.  No associated nausea, vomiting, diaphoresis.  Reported that his last episode of this was at the time of his CABG.  EKG with no acute ST-T wave changes.   Troponins were negative x 2.  Since being at [facility] he has not had any chest pain or dyspnea.  He was admitted due to concerns of unstable angina.  Hospital Course     Consultants:    Chest pain Symptoms concerning for unstable angina that might have been provoked by emotional stress and/or labile blood pressure readings.  His last Sal Crass was in March 2024 and was normal. Patient here opted for conservative approach.   Lexiscan was repeated again this admission that did not demonstrate any signs of ischemia.   Start aspirin, continue with atenolol 6.25 mg daily.  Med rec had shown 12.5 mg dose.  Will defer to outpatient setting if they want to change this.  Continue with Praluent. Will add Imdur 30 mg as well, he has been sensitive to multiple medications and has had issues with dizziness but we will see if this helps and if he tolerates. Echo here showing EF 70 to 75% with mildly reduced RV function.  No significant valvular disease.  Hypertension These had labile blood pressure readings and recurrent issues with dizziness.  Manage in the context of above.  Ultrasound of the kidneys do not note any renal artery stenosis.  I have asked him to keep blood pressure log.  Hyperlipidemia LDL more recently appears to be less controlled.  Currently 102 2 months ago.  Patient seen and examined by myself and Dr. Filiberto Hug deemed stable for discharge.  Medications have been sent to his home pharmacy.  Follow-up has been arranged.  Did the patient have an acute coronary syndrome (MI, NSTEMI, STEMI, etc) this admission?:  No                               Did the patient have a percutaneous coronary intervention (stent / angioplasty)?:  No.       _____________  Discharge Vitals Blood pressure 120/61, pulse 64, temperature 97.7 F (36.5 C), temperature source Oral, resp. rate 18, height 6' (1.829 m), weight 82.5 kg, SpO2 98%.  Filed Weights   09/27/23 2055 09/28/23 0512  Weight: 85.3  kg 82.5 kg    Labs & Radiologic Studies    CBC Recent Labs    09/27/23 1315 09/27/23 2206  WBC 5.3 6.4  HGB 15.7 16.2  HCT 45.8 46.9  MCV 94.0 93.6  PLT 126* 122*   Basic Metabolic Panel Recent Labs    16/10/96 1315 09/27/23 2206 09/28/23 0307  NA 138  --  140  K 3.8  --  3.8  CL 106  --  107  CO2 23  --  26  GLUCOSE 114*  --  94  BUN 14  --  9  CREATININE 1.19 1.29* 1.24  CALCIUM 9.4  --  8.9  MG  --   --  2.0   Liver Function Tests No results for input(s): "AST", "ALT", "ALKPHOS", "BILITOT", "PROT", "ALBUMIN" in the last 72 hours. No results for input(s): "LIPASE", "AMYLASE" in the last 72 hours. High Sensitivity Troponin:   Recent Labs  Lab 09/27/23 1315 09/27/23  1634  TROPONINIHS 7 8    BNP Invalid input(s): "POCBNP" D-Dimer No results for input(s): "DDIMER" in the last 72 hours. Hemoglobin A1C No results for input(s): "HGBA1C" in the last 72 hours. Fasting Lipid Panel No results for input(s): "CHOL", "HDL", "LDLCALC", "TRIG", "CHOLHDL", "LDLDIRECT" in the last 72 hours. Thyroid Function Tests No results for input(s): "TSH", "T4TOTAL", "T3FREE", "THYROIDAB" in the last 72 hours.  Invalid input(s): "FREET3" _____________  VAS US RENAL ARTERY DUPLEX Result Date: 09/28/2023 ABDOMINAL VISCERAL Patient Name:  Ronald CHOYCE Sr.  Date of Exam:   09/28/2023 Medical Rec #: [id number]           Accession #:    [id number] Date of Birth: Feb 12, 1935           Patient Gender: M Patient Age:   16 years Exam Location:  [facility] Procedure:      VAS US RENAL ARTERY DUPLEX Referring Phys: [id number] [facility] J PATWARDHAN -------------------------------------------------------------------------------- Indications: Hypertension High Risk Factors: Hypertension, coronary artery disease. Other Factors: GERD. Limitations: Air/bowel gas. Performing Technologist: [place] Sink [place] RDMS, RVT  Examination Guidelines: A complete evaluation includes B-mode imaging, spectral  Doppler, color Doppler, and power Doppler as needed of all accessible portions of each vessel. Bilateral testing is considered an integral part of a complete examination. Limited examinations for reoccurring indications may be performed as noted.  Duplex Findings: +--------------------+--------+--------+------+--------+ Mesenteric          PSV cm/sEDV cm/sPlaqueComments +--------------------+--------+--------+------+--------+ Aorta Mid              66                          +--------------------+--------+--------+------+--------+ Celiac Artery Origin  151                          +--------------------+--------+--------+------+--------+ SMA Proximal          143                          +--------------------+--------+--------+------+--------+    +------------------+--------+--------+-------+ Right Renal ArteryPSV cm/sEDV cm/sComment +------------------+--------+--------+-------+ Origin              116      29           +------------------+--------+--------+-------+ Proximal            108      19           +------------------+--------+--------+-------+ Mid                  93      23           +------------------+--------+--------+-------+ Distal               76      26           +------------------+--------+--------+-------+ +-----------------+--------+--------+-------+ Left Renal ArteryPSV cm/sEDV cm/sComment +-----------------+--------+--------+-------+ Origin             101      21           +-----------------+--------+--------+-------+ Proximal            82      17           +-----------------+--------+--------+-------+ Mid                108      33           +-----------------+--------+--------+-------+  Distal             134      23           +-----------------+--------+--------+-------+ +------------+--------+--------+----+-----------+--------+--------+----+ Right KidneyPSV cm/sEDV cm/sRI  Left KidneyPSV cm/sEDV cm/sRI    +------------+--------+--------+----+-----------+--------+--------+----+ Upper Pole  34      13      0.62Upper Pole 23      11      0.55 +------------+--------+--------+----+-----------+--------+--------+----+ Mid         33      12      0.        25      10      0.60 +------------+--------+--------+----+-----------+--------+--------+----+ Lower Pole  29      13      0.62Lower Pole 20      7       0.67 +------------+--------+--------+----+-----------+--------+--------+----+ Hilar       40      13      0.67Hilar      52      10      0.80 +------------+--------+--------+----+-----------+--------+--------+----+ +------------------+-----+------------------+-----+ Right Kidney           Left Kidney             +------------------+-----+------------------+-----+ RAR                    RAR                     +------------------+-----+------------------+-----+ RAR (manual)      1.75 RAR (manual)      1.53  +------------------+-----+------------------+-----+ Cortex                 Cortex                  +------------------+-----+------------------+-----+ Cortex thickness       Corex thickness         +------------------+-----+------------------+-----+ Kidney length (cm)10.95Kidney length (cm)10.80 +------------------+-----+------------------+-----+   Summary: Renal:  Right: No evidence of right renal artery stenosis. Normal right        Resisitive Index. Normal size right kidney. Left:  No evidence of left renal artery stenosis. Normal left        Resistive Index. Normal size of left kidney. Cyst(s) noted.        measuring 2.5x2.4 xm.  *See table(s) above for measurements and observations.     Preliminary    NM Myocar Multi W/Spect W/Wall Motion / EF Result Date: 09/28/2023   Baseline ECG is abnormal. ECG rhythm shows normal sinus rhythm. ECG demonstrates first-degree AV block, left anterior fascicular block and right bundle branch block.   A  pharmacological stress test was performed using IV Lexiscan 0.4mg  over 10 seconds performed without concurrent submaximal exercise. The patient reported headaches and flushing during the stress test. Symptoms began during stress and ended during stress. Normal blood pressure and normal heart rate response noted during stress. Heart rate recovery was normal.   No ST deviation was noted from baseline EKG. Arrhythmias during stress: rare PACs. ECG was interpretable and without significant changes. The ECG was not diagnostic due to pharmacologic protocol.   LV perfusion is normal. There is no evidence of ischemia. There is no evidence of infarction.   Left ventricular function is normal. Nuclear stress EF: 79%. The left ventricular ejection fraction is hyperdynamic (>65%). End diastolic cavity size is normal. End systolic cavity size is normal. No evidence of transient ischemic dilation (TID) noted.  The study is normal. The study is low risk.   ECHOCARDIOGRAM COMPLETE Result Date: 09/28/2023    ECHOCARDIOGRAM REPORT   Patient Name:   Ronald REPASS Sr. Date of Exam: 09/28/2023 Medical Rec #:  [id number]          Height:       72.0 in Accession #:    [id number]         Weight:       181.9 lb Date of Birth:  Sep 15, 1934          BSA:          2.046 m Patient Age:    89 years           BP:           117/70 mmHg Patient Gender: M                  HR:           89 bpm. Exam Location:  Inpatient Procedure: 2D Echo, Cardiac Doppler and Color Doppler (Both Spectral and Color            Flow Doppler were utilized during procedure). Indications:    Chest pain  History:        Patient has prior history of Echocardiogram examinations, most                 recent 01/08/2019. CAD, Abnormal ECG and Prior CABG,                 Signs/Symptoms:Chest Pain; Risk Factors:Dyslipidemia and                 Hypertension.  Sonographer:    Juanita Shaw Referring Phys: [id number] NATHAN P GOODWIN IMPRESSIONS  1. Left ventricular ejection fraction,  by estimation, is 70 to 75%. The left ventricle has hyperdynamic function. The left ventricle has no regional wall motion abnormalities. Left ventricular diastolic parameters are indeterminate.  2. Right ventricular systolic function is mildly reduced. The right ventricular size is mildly enlarged.  3. The mitral valve is normal in structure. No evidence of mitral valve regurgitation. No evidence of mitral stenosis.  4. The aortic valve is tricuspid. Aortic valve regurgitation is not visualized. No aortic stenosis is present.  5. The inferior vena cava is normal in size with greater than 50% respiratory variability, suggesting right atrial pressure of 3 mmHg. FINDINGS  Left Ventricle: Left ventricular ejection fraction, by estimation, is 70 to 75%. The left ventricle has hyperdynamic function. The left ventricle has no regional wall motion abnormalities. The left ventricular internal cavity size was small. There is no  left ventricular hypertrophy. Left ventricular diastolic parameters are indeterminate. Right Ventricle: The right ventricular size is mildly enlarged. No increase in right ventricular wall thickness. Right ventricular systolic function is mildly reduced. Left Atrium: Left atrial size was normal in size. Right Atrium: Right atrial size was normal in size. Pericardium: There is no evidence of pericardial effusion. Mitral Valve: The mitral valve is normal in structure. Mild mitral annular calcification. No evidence of mitral valve regurgitation. No evidence of mitral valve stenosis. MV peak gradient, 3.2 mmHg. The mean mitral valve gradient is 1.0 mmHg. Tricuspid Valve: The tricuspid valve is normal in structure. Tricuspid valve regurgitation is not demonstrated. No evidence of tricuspid stenosis. Aortic Valve: The aortic valve is tricuspid. Aortic valve regurgitation is not visualized. No aortic stenosis is present. Aortic valve mean gradient measures 2.0 mmHg. Aortic valve peak gradient measures  3.0  mmHg. Aortic valve area, by VTI measures 4.76 cm. Pulmonic Valve: The pulmonic valve was normal in structure. Pulmonic valve regurgitation is not visualized. No evidence of pulmonic stenosis. Aorta: The aortic root is normal in size and structure. Venous: The inferior vena cava is normal in size with greater than 50% respiratory variability, suggesting right atrial pressure of 3 mmHg. IAS/Shunts: No atrial level shunt detected by color flow Doppler.  LEFT VENTRICLE PLAX 2D LVIDd:         3.70 cm      Diastology LVIDs:         2.40 cm      LV e' medial:    5.77 cm/s LV PW:         0.80 cm      LV E/e' medial:  13.7 LV IVS:        1.00 cm      LV e' lateral:   8.81 cm/s LVOT diam:     2.00 cm      LV E/e' lateral: 8.9 LV SV:         85 LV SV Index:   42 LVOT Area:     3.14 cm  LV Volumes (MOD) LV vol d, MOD A2C: 95.6 ml LV vol d, MOD A4C: 114.0 ml LV vol s, MOD A2C: 38.3 ml LV vol s, MOD A4C: 23.8 ml LV SV MOD A2C:     57.3 ml LV SV MOD A4C:     114.0 ml LV SV MOD BP:      75.3 ml RIGHT VENTRICLE            IVC RV Basal diam:  4.80 cm    IVC diam: 1.00 cm RV Mid diam:    3.20 cm RV S prime:     9.03 cm/s TAPSE (M-mode): 1.9 cm LEFT ATRIUM             Index        RIGHT ATRIUM           Index LA diam:        3.40 cm 1.66 cm/m   RA Area:     12.80 cm LA Vol (A2C):   27.4 ml 13.39 ml/m  RA Volume:   28.80 ml  14.07 ml/m LA Vol (A4C):   38.0 ml 18.57 ml/m LA Biplane Vol: 35.8 ml 17.49 ml/m  AORTIC VALVE                    PULMONIC VALVE AV Area (Vmax):    3.53 cm     PV Vmax:       0.64 m/s AV Area (Vmean):   3.62 cm     PV Peak grad:  1.7 mmHg AV Area (VTI):     4.76 cm AV Vmax:           86.60 cm/s AV Vmean:          60.800 cm/s AV VTI:            0.179 m AV Peak Grad:      3.0 mmHg AV Mean Grad:      2.0 mmHg LVOT Vmax:         97.40 cm/s LVOT Vmean:        70.000 cm/s LVOT VTI:          0.271 m LVOT/AV VTI ratio: 1.51  AORTA Ao Root diam: 3.50 cm Ao Asc diam:  2.70 cm MITRAL VALVE MV  Area (PHT): 2.29 cm      SHUNTS MV Area VTI:   3.89 cm     Systemic VTI:  0.27 m MV Peak grad:  3.2 mmHg     Systemic Diam: 2.00 cm MV Mean grad:  1.0 mmHg MV Vmax:       0.90 m/s MV Vmean:      52.0 cm/s MV Decel Time: 331 msec MV E velocity: 78.80 cm/s MV A velocity: 106.00 cm/s MV E/A ratio:  0.74 Clearnce Hasten Electronically signed by Clearnce Hasten Signature Date/Time: 09/28/2023/8:32:41 AM    Final    DG Chest 2 View Result Date: 09/27/2023 CLINICAL DATA:  Chest pain EXAM: CHEST - 2 VIEW COMPARISON:  June 16, 2023 FINDINGS: No focal airspace consolidation, pleural effusion, or pneumothorax. Hiatal hernia. No cardiomegaly. Sternotomy wires and CABG markers. Tortuous aorta with aortic atherosclerosis. No acute fracture or destructive lesions. Multilevel thoracic osteophytosis. IMPRESSION: No acute cardiopulmonary abnormality. Electronically Signed   By: Wallie Char M.D.   On: 09/27/2023 17:23   Disposition   Pt is being discharged home today in good condition.  Follow-up Plans & Appointments     Follow-up Information     Louanne Skye Devoria Albe., NP Follow up.   Specialty: Cardiology Why: Wednesday Oct 26, 2023 Arrive by 9:50 AM Appt at 10:05 AM (25 min) Contact information: [address] [place] [address] [phone]                   Discharge Medications   Allergies as of 09/28/2023       Reactions   Uloric [febuxostat] Palpitations, Other (See Comments), Hypertension   Hypertension with palpitations and a sense of fuzziness and dizziness at the left side of his head   Cholestyramine Nausea Only   Doxazosin Diarrhea   Doxycycline Diarrhea   Omeprazole Other (See Comments)   Pantoprazole Other (See Comments)   Can tolerate 20mg    Pravastatin Other (See Comments)   Rosuvastatin Other (See Comments)   Zofran [ondansetron]    seizure   Augmentin [amoxicillin-pot Clavulanate] Diarrhea   Ciprofloxacin Diarrhea   Codeine Nausea And Vomiting, Other (See  Comments)   Severe headache   Levaquin [levofloxacin] Diarrhea   Oxycodone Nausea And Vomiting   Tramadol Nausea And Vomiting   Vicodin [hydrocodone-acetaminophen] Nausea And Vomiting        Medication List     STOP taking these medications    colestipol 1 g tablet Commonly known as: COLESTID   famotidine 20 MG tablet Commonly known as: PEPCID   prochlorperazine 10 MG tablet Commonly known as: COMPAZINE       TAKE these medications    acetaminophen 500 MG tablet Commonly known as: TYLENOL Take 500 mg by mouth as needed.   aspirin EC 81 MG tablet Take 1 tablet (81 mg total) by mouth daily. Swallow whole. Start taking on: September 29, 2023   atenolol 25 MG tablet Commonly known as: TENORMIN Take 6.25 mg by mouth daily. At bedtime   fluocinonide cream 0.05 % Commonly known as: LIDEX APPLY TO AFFECTED AREA TWICE A DAY   hydrocortisone 25 MG suppository Commonly known as: ANUSOL-HC PLACE 1 SUPPOSITORY RECTALLY 2 TIMES DAILY.   isosorbide mononitrate 30 MG 24 hr tablet Commonly known as: IMDUR Take 1 tablet (30 mg total) by mouth daily.   lansoprazole 15 MG capsule Commonly known as: PREVACID TAKE 1 CAPSULE (15 MG TOTAL) BY MOUTH DAILY AT 12 NOON. What changed: See  the new instructions.   Metamucil Smooth Texture 58.6 % powder Generic drug: psyllium Take 1 packet by mouth 3 (three) times daily.   nitroGLYCERIN 0.4 MG SL tablet Commonly known as: NITROSTAT Place 1 tablet (0.4 mg total) under the tongue every 5 (five) minutes x 3 doses as needed for chest pain.   Potassium 99 MG Tabs Take 1 tablet by mouth every other day.   Praluent 75 MG/ML Soaj Generic drug: Alirocumab Inject 1 mL (75 mg total) into the skin every 14 (fourteen) days.   VITAMIN D PO Take 1 tablet by mouth daily.           Outstanding Labs/Studies    Duration of Discharge Encounter: APP Time: 20 minutes   Signed, Burnetta Cart, PA-C 09/28/2023, 5:00 PM

## 2023-09-29 ENCOUNTER — Telehealth: Payer: Self-pay

## 2023-09-29 NOTE — Transitions of Care (Post Inpatient/ED Visit) (Signed)
   09/29/2023  Name: Ronald Ramakrishnan Sr. MRN: [id number] DOB: 01/24/1935  Today's TOC FU Call Status: Today's TOC FU Call Status:: Unsuccessful Call (1st Attempt) Unsuccessful Call (1st Attempt) Date: 09/29/23  Attempted to reach the patient regarding the most recent Inpatient/ED visit.  Follow Up Plan: Additional outreach attempts will be made to reach the patient to complete the Transitions of Care (Post Inpatient/ED visit) call.    Katheryn Pandy MSN, RN RN Case Sales executive Health  [facility] Office Hours Wed/Thur  8:00 am-6:00 pm Direct Dial: [phone] Main Phone [phone]  Fax: [phone] [email or web address]

## 2023-09-30 ENCOUNTER — Telehealth: Payer: Self-pay | Admitting: *Deleted

## 2023-09-30 NOTE — Transitions of Care (Post Inpatient/ED Visit) (Signed)
 09/30/2023  Name: Ronald Current Sr. MRN: [id number] DOB: 07/14/1934  Today's TOC FU Call Status: Today's TOC FU Call Status:: Successful TOC FU Call Completed TOC FU Call Complete Date: 09/30/23 Patient's Name and Date of Birth confirmed.  Transition Care Management Follow-up Telephone Call Date of Discharge: 09/28/23 Discharge Facility: [facility]) Type of Discharge: Inpatient Admission Primary Inpatient Discharge Diagnosis:: Chest pain How have you been since you were released from the hospital?: Better Any questions or concerns?: No  Items Reviewed: Did you receive and understand the discharge instructions provided?: Yes Medications obtained,verified, and reconciled?: Yes (Medications Reviewed) Any new allergies since your discharge?: No Dietary orders reviewed?: Yes Type of Diet Ordered:: low sodium heart healthy Do you have support at home?: Yes People in Home [RPT]: spouse Name of Support/Comfort Primary Source: Anne  Medications Reviewed Today: Medications Reviewed Today     Reviewed by Eilene Grater, RN (Case Manager) on 09/30/23 at 1536  Med List Status: <None>   Medication Order Taking? Sig Documenting Provider Last Dose Status Informant  acetaminophen  (TYLENOL ) 500 MG tablet [id number] Yes Take 500 mg by mouth as needed. [provider] Taking Active Multiple Informants           Med Note Andee Kato, KIMBERLY L   Tue Sep 27, 2023  4:19 PM) prn  Alirocumab  (PRALUENT ) 75 MG/ML SOAJ [id number] Yes Inject 1 mL (75 mg total) into the skin every 14 (fourteen) days. Elmyra Haggard, MD Taking Active Multiple Informants           Med Note Andee Kato, KIMBERLY L   Tue Sep 27, 2023  4:20 PM) 2 weeks ago from wednesday  aspirin  EC 81 MG tablet [id number]  Take 1 tablet (81 mg total) by mouth daily. Swallow whole. Burnetta Cart, PA-C  Active            Med Note Ancil Kast, Patterson Bora   Fri Sep 30, 2023  3:35 PM) Per patient he will talk with PCP before taking   atenolol  (TENORMIN ) 25 MG tablet [id number] Yes Take 6.25 mg by mouth daily. At bedtime [provider] Taking Active Self, Multiple Informants  fluocinonide  cream (LIDEX ) 0.05 % [id number] Yes APPLY TO AFFECTED AREA TWICE A DAY Delfina Feller, FNP Taking Active Multiple Informants           Med Note Ethyl Hering, CRYSTAL L   Thu Jul 14, 2023 12:56 PM) As needed.  hydrocortisone  (ANUSOL -HC) 25 MG suppository [id number] Yes PLACE 1 SUPPOSITORY RECTALLY 2 TIMES DAILY. Delfina Feller, FNP Taking Active Multiple Informants           Med Note Ethyl Hering, CRYSTAL L   Thu Jul 14, 2023 12:56 PM) If needed.  isosorbide  mononitrate (IMDUR ) 30 MG 24 hr tablet [id number] Yes Take 1 tablet (30 mg total) by mouth daily. Burnetta Cart, PA-C Taking Active   lansoprazole  (PREVACID ) 15 MG capsule [id number] Yes TAKE 1 CAPSULE (15 MG TOTAL) BY MOUTH DAILY AT 12 NOON.  Patient taking differently: Take 15 mg by mouth 2 (two) times daily before a meal.   Rakes, Georgeann Kindred, FNP Taking Active Multiple Informants  nitroGLYCERIN  (NITROSTAT ) 0.4 MG SL tablet [id number] Yes Place 1 tablet (0.4 mg total) under the tongue every 5 (five) minutes x 3 doses as needed for chest pain. Burnetta Cart, PA-C Taking Active   Potassium 99 MG TABS [id number] Yes Take 1 tablet by mouth every other day. [provider] Taking Active Multiple Informants  Med Note (SUTTON, CRYSTAL L   Thu Jul 14, 2023 12:55 PM) Three times per week.  psyllium (METAMUCIL SMOOTH TEXTURE) 58.6 % powder [id number] Yes Take 1 packet by mouth 3 (three) times daily. Galvin Jules, FNP Taking Active Multiple Informants  VITAMIN D  PO [id number] Yes Take 1 tablet by mouth daily. [provider] Taking Active Multiple Informants           Med Note Ethyl Hering, CRYSTAL L   Thu Jul 14, 2023 12:55 PM) Three times per week.            Home Care and Equipment/Supplies: Were Home Health Services Ordered?: NA Any new equipment or medical  supplies ordered?: NA  Functional Questionnaire: Do you need assistance with bathing/showering or dressing?: No Do you need assistance with meal preparation?: No Do you need assistance with eating?: No Do you have difficulty maintaining continence: No  Follow up appointments reviewed: PCP Follow-up appointment confirmed?: No MD Provider Line [phone] Given: Yes (He is going to call and make appt) Specialist Hospital Follow-up appointment confirmed?: Yes Date of Specialist follow-up appointment?: 10/26/23 Follow-Up Specialty Provider:: [facility] Do you need transportation to your follow-up appointment?: No Do you understand care options if your condition(s) worsen?: Yes-patient verbalized understanding  SDOH Interventions Today    Flowsheet Row Most Recent Value  SDOH Interventions   Food Insecurity Interventions Intervention Not Indicated  Housing Interventions Intervention Not Indicated  Transportation Interventions Intervention Not Indicated  Utilities Interventions Intervention Not Indicated       Una Ganser BSN RN [facility] Care Management Coordinator [email or web address] Direct Dial: [phone]  Fax: [phone] Website: [email or web address]

## 2023-10-03 ENCOUNTER — Other Ambulatory Visit: Payer: Self-pay | Admitting: Family Medicine

## 2023-10-03 DIAGNOSIS — K219 Gastro-esophageal reflux disease without esophagitis: Secondary | ICD-10-CM

## 2023-10-12 ENCOUNTER — Ambulatory Visit (INDEPENDENT_AMBULATORY_CARE_PROVIDER_SITE_OTHER)

## 2023-10-12 ENCOUNTER — Ambulatory Visit

## 2023-10-12 ENCOUNTER — Other Ambulatory Visit: Payer: Self-pay | Admitting: Family Medicine

## 2023-10-12 ENCOUNTER — Encounter: Payer: Self-pay | Admitting: Family Medicine

## 2023-10-12 VITALS — BP 122/61 | HR 55 | Temp 97.3°F | Ht 73.0 in | Wt 191.8 lb

## 2023-10-12 DIAGNOSIS — Z09 Encounter for follow-up examination after completed treatment for conditions other than malignant neoplasm: Secondary | ICD-10-CM

## 2023-10-12 DIAGNOSIS — S51812A Laceration without foreign body of left forearm, initial encounter: Secondary | ICD-10-CM | POA: Diagnosis not present

## 2023-10-12 DIAGNOSIS — L03114 Cellulitis of left upper limb: Secondary | ICD-10-CM | POA: Diagnosis not present

## 2023-10-12 DIAGNOSIS — R079 Chest pain, unspecified: Secondary | ICD-10-CM

## 2023-10-12 DIAGNOSIS — I10 Essential (primary) hypertension: Secondary | ICD-10-CM | POA: Diagnosis not present

## 2023-10-12 DIAGNOSIS — S41119A Laceration without foreign body of unspecified upper arm, initial encounter: Secondary | ICD-10-CM

## 2023-10-12 DIAGNOSIS — Z951 Presence of aortocoronary bypass graft: Secondary | ICD-10-CM | POA: Diagnosis not present

## 2023-10-12 MED ORDER — ATENOLOL+SYRSPEND SF 1 MG/ML PO SUSP: 6.2500 mg | Freq: Every day | ORAL | 0 refills | Status: DC

## 2023-10-12 MED ORDER — MUPIROCIN CALCIUM 2 % EX CREA: 1.0000 | TOPICAL_CREAM | Freq: Two times a day (BID) | CUTANEOUS | 0 refills | Status: DC
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
## 2023-10-13 ENCOUNTER — Other Ambulatory Visit: Payer: Self-pay | Admitting: Family Medicine

## 2023-10-13 DIAGNOSIS — Z951 Presence of aortocoronary bypass graft: Secondary | ICD-10-CM

## 2023-10-13 DIAGNOSIS — R079 Chest pain, unspecified: Secondary | ICD-10-CM

## 2023-10-13 DIAGNOSIS — Z09 Encounter for follow-up examination after completed treatment for conditions other than malignant neoplasm: Secondary | ICD-10-CM

## 2023-10-13 DIAGNOSIS — I10 Essential (primary) hypertension: Secondary | ICD-10-CM

## 2023-10-13 NOTE — Telephone Encounter (Signed)
  atenolol  (TENORMIN ) 100 MG tablet        Changed from: Atenolol +SyrSpend SF 1 MG/ML SUSP    Pharmacy comment: Script Clarification:THIS IS SUSPENSION THAT WE DONT CARRY.

## 2023-10-13 NOTE — Telephone Encounter (Signed)
  atenolol  (TENORMIN ) 100 MG tablet        Changed from: Atenolol +SyrSpend SF 1 MG/ML SUSP   Pharmacy comment: Alternative Requested:CANT FIND LIQUID FORM TO ORDER.

## 2023-10-26 ENCOUNTER — Ambulatory Visit: Admitting: Nurse Practitioner

## 2023-11-09 ENCOUNTER — Ambulatory Visit: Admitting: Nurse Practitioner

## 2023-12-07 ENCOUNTER — Telehealth: Payer: Self-pay

## 2023-12-07 ENCOUNTER — Telehealth: Payer: Self-pay | Admitting: Family Medicine

## 2023-12-07 NOTE — Telephone Encounter (Signed)
 Copied from CRM [id number]. Topic: Clinical - Lab/Test Results >> Dec 07, 2023  9:35 AM Carlatta H wrote: Reason for CRM: Patient is request lab order without having to be seen in the office//He stated he has been seen at the [facility] within the last 10 days and has a home health nurse coming today//Please call to advise

## 2023-12-07 NOTE — Telephone Encounter (Signed)
 Refer to other phone call

## 2023-12-07 NOTE — Telephone Encounter (Signed)
 Patient wants to come in and do labs for his cholestrol. Was seen at the [place] and needs labs done. Please call.

## 2023-12-08 ENCOUNTER — Ambulatory Visit: Payer: PPO

## 2023-12-08 VITALS — BP 122/61 | HR 55 | Ht 73.0 in | Wt 191.0 lb

## 2023-12-08 DIAGNOSIS — Z0000 Encounter for general adult medical examination without abnormal findings: Secondary | ICD-10-CM | POA: Diagnosis not present

## 2023-12-08 NOTE — Patient Instructions (Signed)
 [name] , Thank you for taking time out of your busy schedule to complete your Annual Wellness Visit with me. I enjoyed our conversation and look forward to speaking with you again next year. I, as well as your care team,  appreciate your ongoing commitment to your health goals. Please review the following plan we discussed and let me know if I can assist you in the future. Your Game plan/ To Do List    Follow up Visits: Next Medicare AWV with our clinical staff: 12/10/24 at 11:20a.m.   Next Office Visit with your provider: 01/06/24 at 11:50a.m.  Clinician Recommendations:  Aim for 30 minutes of exercise or brisk walking, 6-8 glasses of water , and 5 servings of fruits and vegetables each day. Pt is aware he is due the following: Pnuemonia/shingles vaccine.      This is a list of the screening recommended for you and due dates:  Health Maintenance  Topic Date Due   Pneumococcal Vaccine for age over 78 (2 of 2 - PPSV23, PCV20, or PCV21) 10/08/2017   Zoster (Shingles) Vaccine (2 of 2) 03/21/2020   Medicare Annual Wellness Visit  12/07/2023   COVID-19 Vaccine (4 - 2024-25 season) 12/23/2024*   Flu Shot  01/13/2024   DTaP/Tdap/Td vaccine (2 - Td or Tdap) 09/17/2028   Hepatitis B Vaccine  Aged Out   HPV Vaccine  Aged Out   Meningitis B Vaccine  Aged Out  *Topic was postponed. The date shown is not the original due date.    Advanced directives: (Declined) Advance directive discussed with you today. Even though you declined this today, please call our office should you change your mind, and we can give you the proper paperwork for you to fill out. Advance Care Planning is important because it:  [x]  Makes sure you receive the medical care that is consistent with your values, goals, and preferences  [x]  It provides guidance to your family and loved ones and reduces their decisional burden about whether or not they are making the right decisions based on your wishes.  Follow the link provided  in your after visit summary or read over the paperwork we have mailed to you to help you started getting your Advance Directives in place. If you need assistance in completing these, please reach out to us  so that we can help you!  See attachments for Preventive Care and Fall Prevention Tips.
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
## 2024-01-06 ENCOUNTER — Other Ambulatory Visit: Payer: Self-pay | Admitting: Family Medicine

## 2024-01-06 ENCOUNTER — Ambulatory Visit: Payer: Self-pay

## 2024-01-06 ENCOUNTER — Encounter: Payer: Self-pay | Admitting: Family Medicine

## 2024-01-06 ENCOUNTER — Telehealth: Admitting: Family Medicine

## 2024-01-06 ENCOUNTER — Ambulatory Visit: Admitting: Family Medicine

## 2024-01-06 ENCOUNTER — Telehealth

## 2024-01-06 DIAGNOSIS — R197 Diarrhea, unspecified: Secondary | ICD-10-CM

## 2024-01-06 DIAGNOSIS — K5792 Diverticulitis of intestine, part unspecified, without perforation or abscess without bleeding: Secondary | ICD-10-CM

## 2024-01-06 DIAGNOSIS — K219 Gastro-esophageal reflux disease without esophagitis: Secondary | ICD-10-CM

## 2024-01-06 MED ORDER — DOXYCYCLINE HYCLATE 100 MG PO TABS: 100.0000 mg | ORAL_TABLET | Freq: Two times a day (BID) | ORAL | 0 refills | Status: AC

## 2024-01-06 NOTE — Progress Notes (Signed)
 Virtual Visit via Video   I connected with patient on 01/06/24 at 1430 by a video enabled telemedicine application and verified that I am speaking with the correct person using two identifiers.  Location patient: Home Location provider: [facility] Persons participating in the virtual visit: Patient and Provider  I discussed the limitations of evaluation and management by telemedicine and the availability of in person appointments. The patient expressed understanding and agreed to proceed.  Subjective:   HPI:  Pt presents today for  Chief Complaint  Patient presents with   Diarrhea    He has been experiencing chronic diarrhea for several months, with episodes occurring three times a day, particularly severe in the mornings. The diarrhea often begins with a normal bowel movement that progresses to watery diarrhea, making it difficult for him to leave the house early in the day.  He has been taking Metamucil with varying results, consuming a spoonful every night after eating. He also takes Pepcid  15 mg every night for Barrett's esophagus. He reports that after his recent endoscopy, he was told and read that there was inflammation from the back of his tongue to the bottom of his stomach.  He has experienced a significant weight loss of 15 pounds, which he attributes to dental issues. He has ongoing problems with a dental bridge, which has been replaced multiple times since October of the previous year, making it difficult to chew and affecting his nutrition.  He recalls having [facility] spotted fever two years ago, which was treated with doxycycline , and notes that his diarrhea improved during that treatment. He has a history of diverticulitis and a CT scan in January showed diverticulosis without diverticulitis. He avoids certain foods like tomatoes and chili to prevent flare-ups.  No recent travel and no blood in stool. He has not tried any other  antibiotics recently, but he recalls that doxycycline  was effective in the past.       ROS per HPI  Patient Active Problem List   Diagnosis Date Noted   Viral URI 03/10/2023   Vomiting 03/10/2023   Chronic diarrhea 03/10/2023   Imbalance 07/05/2022   Vitamin B12 deficiency 12/31/2021   Dizziness 08/10/2021   Barrett's esophagus without dysplasia 05/11/2021   Statin myopathy 01/06/2021   Allergic rhinitis 12/03/2020   Bladder cancer (HCC) 12/03/2020   Family history of malignant neoplasm of gastrointestinal tract 12/03/2020   Hypertrophy of prostate without urinary obstruction and other lower urinary tract symptoms (LUTS) 12/03/2020   Mixed conductive and sensorineural hearing loss, unilateral, right ear with restricted hearing on the contralateral side 12/03/2020   New daily persistent headache (ndph) 12/03/2020   Encounter for immunization 12/03/2020   History of colonic polyps 12/03/2020   Psychosexual dysfunction with inhibited sexual excitement 12/03/2020   History of bladder cancer 07/20/2019   S/P CABG x 3 12/07/2017   CAD (coronary artery disease)    Sebaceous cyst 09/20/2017   GERD without esophagitis 04/13/2017   Postoperative right upper quadrant abdominal pain 12/10/2016   Chronic abdominal pain 12/09/2016   S/P laparoscopic cholecystectomy 12/08/2016   History of right mastoidectomy 11/30/2016   Thrombocytopenia (HCC) 11/29/2016   Chest pain of uncertain etiology 09/01/2016   Palpitations 09/01/2016   Renal insufficiency 09/01/2016   Hyperlipidemia 09/01/2016   Gout 05/20/2016   Post herpetic neuralgia 05/20/2016   GAD (generalized anxiety disorder) 01/27/2016   Vitamin D  deficiency 11/06/2015   BPPV (benign paroxysmal positional vertigo) 09/30/2015   Immune deficiency disorder (  HCC) 08/19/2015   HTN (hypertension), benign 04/18/2013   Temporal arteritis (HCC) 04/18/2013   Anxiety 04/18/2013   HOH (hard of hearing)    Vertigo 07/31/2011   Symptomatic PVCs  07/31/2011    Social History   Tobacco Use   Smoking status: Former    Current packs/day: 0.75    Average packs/day: 0.8 packs/day for 3.0 years (2.3 ttl pk-yrs)    Types: Cigarettes   Smokeless tobacco: Never   Tobacco comments:    02/13/2013 quit smoking age 73  Substance Use Topics   Alcohol use: No    Comment: 02/13/2013 probably a pint of whiskey/wk up til I was probably 89 yr old    Current Outpatient Medications:    doxycycline  (VIBRA -TABS) 100 MG tablet, Take 1 tablet (100 mg total) by mouth 2 (two) times daily for 10 days. 1 po bid, Disp: 20 tablet, Rfl: 0   acetaminophen  (TYLENOL ) 500 MG tablet, Take 500 mg by mouth as needed., Disp: , Rfl:    Alirocumab  (PRALUENT ) 75 MG/ML SOAJ, Inject 1 mL (75 mg total) into the skin every 14 (fourteen) days., Disp: , Rfl:    aspirin  EC 81 MG tablet, Take 1 tablet (81 mg total) by mouth daily. Swallow whole., Disp: 30 tablet, Rfl: 12   atenolol  (TENORMIN ) 25 MG tablet, Take by mouth daily., Disp: , Rfl:    Atenolol +SyrSpend SF 1 MG/ML SUSP, Take 6.25 mg by mouth daily. (Patient not taking: Reported on 12/08/2023), Disp: 1500 mL, Rfl: 0   fluocinonide  cream (LIDEX ) 0.05 %, APPLY TO AFFECTED AREA TWICE A DAY, Disp: 30 g, Rfl: 1   hydrocortisone  (ANUSOL -HC) 25 MG suppository, PLACE 1 SUPPOSITORY RECTALLY 2 TIMES DAILY., Disp: 12 suppository, Rfl: 0   isosorbide  mononitrate (IMDUR ) 30 MG 24 hr tablet, Take 1 tablet (30 mg total) by mouth daily., Disp: 30 tablet, Rfl: 6   lansoprazole  (PREVACID ) 15 MG capsule, TAKE 1 CAPSULE BY MOUTH 2 TIMES DAILY BEFORE A MEAL., Disp: 180 capsule, Rfl: 0   mupirocin  cream (BACTROBAN ) 2 %, Apply 1 Application topically 2 (two) times daily., Disp: 15 g, Rfl: 0   nitroGLYCERIN  (NITROSTAT ) 0.4 MG SL tablet, Place 1 tablet (0.4 mg total) under the tongue every 5 (five) minutes x 3 doses as needed for chest pain., Disp: 25 tablet, Rfl: 2   Potassium 99 MG TABS, Take 1 tablet by mouth every other day., Disp: , Rfl:     psyllium (METAMUCIL SMOOTH TEXTURE) 58.6 % powder, Take 1 packet by mouth 3 (three) times daily., Disp: 283 g, Rfl: 12   VITAMIN D  PO, Take 1 tablet by mouth daily., Disp: , Rfl:   Allergies  Allergen Reactions   Uloric  [Febuxostat ] Palpitations, Other (See Comments) and Hypertension    Hypertension with palpitations and a sense of fuzziness and dizziness at the left side of his head   Cholestyramine  Nausea Only   Doxazosin Diarrhea   Doxycycline  Diarrhea   Omeprazole  Other (See Comments)   Pantoprazole  Other (See Comments)    Can tolerate 20mg    Pravastatin  Other (See Comments)   Rosuvastatin  Other (See Comments)   Zofran  [Ondansetron ]     seizure   Augmentin  [Amoxicillin -Pot Clavulanate] Diarrhea   Ciprofloxacin  Diarrhea   Codeine Nausea And Vomiting and Other (See Comments)    Severe headache   Levaquin  [Levofloxacin ] Diarrhea   Oxycodone  Nausea And Vomiting   Tramadol  Nausea And Vomiting   Vicodin [Hydrocodone-Acetaminophen ] Nausea And Vomiting    Objective:   There were no vitals  taken for this visit.  Patient is well-developed, well-nourished in no acute distress.  Resting comfortably at home.  Head is normocephalic, atraumatic.  No labored breathing.  Speech is clear and coherent with logical content.  Patient is alert and oriented at baseline.    Assessment and Plan:   Ronald Russell was seen today for diarrhea.  Diagnoses and all orders for this visit:  Diarrhea in adult patient -     doxycycline  (VIBRA -TABS) 100 MG tablet; Take 1 tablet (100 mg total) by mouth 2 (two) times daily for 10 days. 1 po bid  Diverticulitis -     doxycycline  (VIBRA -TABS) 100 MG tablet; Take 1 tablet (100 mg total) by mouth 2 (two) times daily for 10 days. 1 po bid        Chronic Diarrhea Ronald Russell experiences chronic diarrhea three to four times daily with severe abdominal pain, especially in the mornings. Symptoms have persisted for several months, with variable severity.  Post-cholecystectomy status may contribute to diarrhea. Metamucil has been used with inconsistent results. He has not been on cholestyramine . No hematochezia is present. Diverticulosis is noted without recent diverticulitis. Diarrhea may be linked to past antibiotic use or gastrointestinal inflammation. Previous doxycycline  use alleviated symptoms, though it is not a standard treatment for diverticulitis. - Prescribe doxycycline  to assess symptom relief based on past efficacy. - Instruct Ronald Russell to report if symptoms persist despite doxycycline . - Consider alternative antibiotics such as amoxicillin  or Augmentin  if doxycycline  is ineffective.  Barrett's Esophagus Barrett's esophagus is confirmed by endoscopy, showing inflammation from the back of the tongue to the stomach. He is on Pepcid  15 mg daily as recommended by gastroenterology. - Continue Pepcid  15 mg daily.  Dental Issues Ronald Russell reports significant dental issues, including a problematic bridge replaced multiple times since October, leading to difficulty chewing and a 15-pound weight loss. He is under dental care but dissatisfied with his current dentist.  Follow-up Ronald Russell is scheduled for a follow-up on September 10th. He has difficulty attending morning appointments due to severe morning symptoms. - Schedule follow-up appointment for September 10th at 7:35 AM. - Advise Ronald Russell to contact the office if unable to attend morning appointments due to symptoms.        Return if symptoms worsen or fail to improve.  Rosaline Bruns, FNP-C [facility] [address] [place], [place] [address] [phone]  01/06/2024  Time spent with the patient: 20 minutes, of which >50% was spent in obtaining information about symptoms, reviewing previous labs, evaluations, and treatments, counseling about condition (please see the discussed topics above), and developing a plan to further investigate it; had a number of questions which I  addressed.

## 2024-01-06 NOTE — Telephone Encounter (Signed)
 Called and spoke with patient to advise. Patient is adamant that [facility] knows about history and that abx have helped in the past this has been going on for 2 years.

## 2024-01-06 NOTE — Telephone Encounter (Signed)
 Appointment scheduled.

## 2024-01-06 NOTE — Telephone Encounter (Signed)
  FYI Only or Action Required?: Action required by provider: clinical question for provider.  Patient was last seen in primary care on 10/12/2023 by Severa Rock HERO, FNP.  Called Nurse Triage reporting Diarrhea/nausea/intermittent severe abd pain (pt has h/o diverticulitis that was better after abx).  Symptoms began 3 days .  Interventions attempted: Other: Vaseline to anus .  Symptoms are: unchanged.  Triage Disposition: See Physician Within 24 Hours- due to abd pain was concerned that pt may need to go to ED. Pt is tolerating solids and fluids, no vomiting, intermittent abd pain, Called [facility] and discussed what the best disposition would be. Pt had to cancel appt stating weakness and having diarrhea episodes.    Patient/caregiver understands and will follow disposition?:        Copied from CRM [id number]. Topic: Clinical - Red Word Triage >> Jan 06, 2024 11:11 AM Zebedee SAUNDERS wrote: Red Word that prompted transfer to Nurse Triage: Pt stated has diarrhea for the last 3 days. Reason for Disposition  [1] MODERATE diarrhea (e.g., 4-6 times / day more than normal) AND [2] age > 70 years  Answer Assessment - Initial Assessment Questions 1. DIARRHEA SEVERITY: How bad is the diarrhea? How many more stools have you had in the past 24 hours than normal?     Worse in am / 6 watery stools causing burning to anus 2. ONSET: When did the diarrhea begin?      3 days 3. STOOL DESCRIPTION:  How loose or watery is the diarrhea? What is the stool color? Is there any blood or mucous in the stool?     Water  no blood stated blood from hemorrhoids  4. VOMITING: Are you also vomiting? If Yes, ask: How many times in the past 24 hours?      no 5. ABDOMEN PAIN: Are you having any abdomen pain? If Yes, ask: What does it feel like? (e.g., crampy, dull, intermittent, constant)      Yes/crampy comes and goes sometime severe 6. ABDOMEN PAIN SEVERITY: If present, ask: How bad is the pain?  (e.g.,  Scale 1-10; mild, moderate, or severe)     0/10 at times  7. ORAL INTAKE: If vomiting, Have you been able to drink liquids? How much liquids have you had in the past 24 hours?     Not vomiting /tolerating water   8. HYDRATION: Any signs of dehydration? (e.g., dry mouth [not just dry lips], too weak to stand, dizziness, new weight loss) When did you last urinate?     Weakness, dizziness (at baseline), 15 pound weight loss (pt stated was d/t mouth care ) 9. EXPOSURE: Have you traveled to a foreign country recently? Have you been exposed to anyone with diarrhea? Could you have eaten any food that was spoiled?     *No Answer* 10. ANTIBIOTIC USE: Are you taking antibiotics now or have you taken antibiotics in the past 2 months?       no 11. OTHER SYMPTOMS: Do you have any other symptoms? (e.g., fever, blood in stool)       Nausea, blood when wipes on toilet paper  Protocols used: Diarrhea-A-AH

## 2024-01-06 NOTE — Telephone Encounter (Signed)
 Patient had appointment today and canceled it

## 2024-01-09 ENCOUNTER — Telehealth: Payer: Self-pay | Admitting: Gastroenterology

## 2024-01-09 ENCOUNTER — Other Ambulatory Visit (INDEPENDENT_AMBULATORY_CARE_PROVIDER_SITE_OTHER): Payer: Self-pay | Admitting: Gastroenterology

## 2024-01-09 DIAGNOSIS — K9089 Other intestinal malabsorption: Secondary | ICD-10-CM

## 2024-01-09 MED ORDER — COLESTIPOL HCL 5 G PO GRAN: 5.0000 g | GRANULES | Freq: Every day | ORAL | 5 refills | Status: DC

## 2024-01-09 NOTE — Telephone Encounter (Signed)
 Will send Colestipol  granules to pharmacy - needs to take 4 hours apart from other medications (please emphasize this). Will see him for follow up in clinic.

## 2024-01-09 NOTE — Telephone Encounter (Signed)
 Tracey (nurse) called on patient's behalf to make him an OV with chronic diarrhea w/nausea. She and patient are aware of OV with Dr Eartha on 8/7 at 230 pm. She also asked about what medication would be recommended for him to take in the meantime. I told her that I would have the nurse call with recommendations. Ronald Russell said it would be ok to just call the patient at [phone] or she can be reached at [phone]

## 2024-01-09 NOTE — Telephone Encounter (Signed)
 Pt having diarrhea, weakness and nausea. Home heath nurse states that anything he eats goes right through him. No fever.  Please advise. Thank you.

## 2024-01-10 NOTE — Telephone Encounter (Signed)
 Pt contacted. Pt states this med was sent in after EGD and pharmacist told him he could not take the Colestipol  due to not having a gallbladder. Pt states he is going to start Imodium. Please advise. Thank you

## 2024-01-10 NOTE — Telephone Encounter (Signed)
 Pt contacted and verbalized understanding.

## 2024-01-10 NOTE — Telephone Encounter (Signed)
 Please let him know that the advice given by the pharmacist is wrong. He can take colestipol  if he does not have a gallbladder. He can take Imodium instead if he prefers that - can take it up to 4 times per day. Colestipol  was already sent to his pharmacy, so he can choose which one he wants to use.

## 2024-01-19 ENCOUNTER — Encounter (INDEPENDENT_AMBULATORY_CARE_PROVIDER_SITE_OTHER): Payer: Self-pay | Admitting: Gastroenterology

## 2024-01-19 ENCOUNTER — Ambulatory Visit (INDEPENDENT_AMBULATORY_CARE_PROVIDER_SITE_OTHER): Admitting: Gastroenterology

## 2024-01-19 VITALS — BP 119/69 | HR 56 | Temp 97.5°F | Ht 72.0 in | Wt 190.5 lb

## 2024-01-19 DIAGNOSIS — K529 Noninfective gastroenteritis and colitis, unspecified: Secondary | ICD-10-CM

## 2024-01-19 DIAGNOSIS — K219 Gastro-esophageal reflux disease without esophagitis: Secondary | ICD-10-CM

## 2024-01-19 DIAGNOSIS — K2270 Barrett's esophagus without dysplasia: Secondary | ICD-10-CM

## 2024-01-19 MED ORDER — COLESTIPOL HCL 1 G PO TABS: 2.0000 g | ORAL_TABLET | Freq: Every day | ORAL | 3 refills | Status: DC
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
## 2024-01-19 NOTE — Patient Instructions (Addendum)
 Start colestipol  2 g qday Can continue taking Metamucil as needed Continue Prevacid  15 mg daily Please follow-up closely with PCP regarding lightheadedness

## 2024-02-02 DIAGNOSIS — C441191 Basal cell carcinoma of skin of left upper eyelid, including canthus: Secondary | ICD-10-CM | POA: Diagnosis not present

## 2024-02-07 ENCOUNTER — Ambulatory Visit: Attending: Student | Admitting: Student

## 2024-02-07 ENCOUNTER — Encounter: Payer: Self-pay | Admitting: Student

## 2024-02-07 VITALS — BP 120/60 | HR 57 | Ht 71.0 in | Wt 192.6 lb

## 2024-02-07 DIAGNOSIS — R5383 Other fatigue: Secondary | ICD-10-CM | POA: Diagnosis not present

## 2024-02-07 DIAGNOSIS — E785 Hyperlipidemia, unspecified: Secondary | ICD-10-CM | POA: Diagnosis not present

## 2024-02-07 DIAGNOSIS — I2510 Atherosclerotic heart disease of native coronary artery without angina pectoris: Secondary | ICD-10-CM | POA: Diagnosis not present

## 2024-02-07 DIAGNOSIS — R42 Dizziness and giddiness: Secondary | ICD-10-CM

## 2024-02-07 DIAGNOSIS — Z79899 Other long term (current) drug therapy: Secondary | ICD-10-CM

## 2024-02-07 DIAGNOSIS — R002 Palpitations: Secondary | ICD-10-CM | POA: Diagnosis not present

## 2024-02-07 MED ORDER — ASPIRIN 81 MG PO TBEC: 81.0000 mg | DELAYED_RELEASE_TABLET | Freq: Every day | ORAL | Status: DC

## 2024-02-07 NOTE — Progress Notes (Signed)
 Cardiology Office Note    Date:  02/07/2024  ID:  Ronald Fortenberry Sr., DOB 07/09/1934, MRN [id number] Cardiologist: Vina Gull, MD { :  History of Present Illness:    Ronald Stammer Sr. is a 89 y.o. male with past medical history of CAD (s/p CABG in 11/2017 with LIMA-LAD, SVG-D1 and SVG-OM2, low-risk NST's in 08/2022 and 09/2023), post-op atrial fibrillation (occurring after CABG), palpitations (PAC's and PVC's by prior monitor), HLD, temporal arteritis, Barrett's esophagus, vertigo and history of bladder cancer who presents to the office today for 83-month follow-up.  He was examined by Dr. Gull in 07/2023 and had previously stopped Atenolol  with improvement in dizziness but had restarted the medication given elevated BP. Was having frequent diarrhea at the time of his office visit and had an upcoming EGD scheduled for later in the week. No changes were made to his cardiac medications at that time.  In the interim, he was hospitalized at [facility] in 09/2023 for evaluation of chest pain. Hs troponin values were negative and options were reviewed in regards to further ischemic evaluation and he preferred a follow-up Lexiscan  Myoview  as compared to a repeat catheterization. This showed no evidence of ischemia was a low-risk study. He was started on Imdur  30 mg daily. A follow-up appointment was scheduled in 10/2023 but he canceled this.  In talking with the patient and his wife today, he reports his biggest issue continues to be dizziness which has been occurring for over a year now. Says he initially feels well in the morning and he develops dizziness upon consuming breakfast. Symptoms persist throughout the day and then gradually improve in the evening hours. Dizziness is typically triggered by food consumption. No association with standing up but he reports dizziness improves with turning from side-to-side in bed. He previously held Atenolol  with no improvement in symptoms. Says he developed  worsening palpitations while holding the medication and resumed at 6.25 mg daily. He does not consume caffeine.  Breathing has overall been stable and no specific orthopnea, PND or lower extremity edema. Continues to have intermittent diarrhea and is unaware of any specific food triggers.  Studies Reviewed:   EKG: EKG is not ordered today.   Echocardiogram: 09/2023 IMPRESSIONS     1. Left ventricular ejection fraction, by estimation, is 70 to 75%. The  left ventricle has hyperdynamic function. The left ventricle has no  regional wall motion abnormalities. Left ventricular diastolic parameters  are indeterminate.   2. Right ventricular systolic function is mildly reduced. The right  ventricular size is mildly enlarged.   3. The mitral valve is normal in structure. No evidence of mitral valve  regurgitation. No evidence of mitral stenosis.   4. The aortic valve is tricuspid. Aortic valve regurgitation is not  visualized. No aortic stenosis is present.   5. The inferior vena cava is normal in size with greater than 50%  respiratory variability, suggesting right atrial pressure of 3 mmHg.   NST: 09/2023   Baseline ECG is abnormal. ECG rhythm shows normal sinus rhythm. ECG demonstrates first-degree AV block, left anterior fascicular block and right bundle branch block.   A pharmacological stress test was performed using IV Lexiscan  0.4mg  over 10 seconds performed without concurrent submaximal exercise. The patient reported headaches and flushing during the stress test. Symptoms began during stress and ended during stress. Normal blood pressure and normal heart rate response noted during stress. Heart rate recovery was normal.   No ST deviation was noted from  baseline EKG. Arrhythmias during stress: rare PACs. ECG was interpretable and without significant changes. The ECG was not diagnostic due to pharmacologic protocol.   LV perfusion is normal. There is no evidence of ischemia. There is no  evidence of infarction.   Left ventricular function is normal. Nuclear stress EF: 79%. The left ventricular ejection fraction is hyperdynamic (>65%). End diastolic cavity size is normal. End systolic cavity size is normal. No evidence of transient ischemic dilation (TID) noted.   The study is normal. The study is low risk.  Physical Exam:   VS:  BP 120/60 (BP Location: Left Arm, Cuff Size: Normal)   Pulse (!) 57   Ht 5' 11 (1.803 m)   Wt 192 lb 9.6 oz (87.4 kg)   SpO2 97%   BMI 26.86 kg/m    Wt Readings from Last 3 Encounters:  02/07/24 192 lb 9.6 oz (87.4 kg)  01/19/24 190 lb 8 oz (86.4 kg)  12/08/23 191 lb (86.6 kg)     GEN: Pleasant, elderly male appearing in no acute distress NECK: No JVD; No carotid bruits CARDIAC: RRR, no murmurs, rubs, gallops RESPIRATORY:  Clear to auscultation without rales, wheezing or rhonchi  ABDOMEN: Appears non-distended. No obvious abdominal masses. EXTREMITIES: No clubbing or cyanosis. No pitting edema.  Distal pedal pulses are 2+ bilaterally.   Assessment and Plan:   1. Coronary artery disease involving native coronary artery of native heart without angina pectoris - He previously underwent CABG in 11/2017 and recent NST in 09/2023 showed no evidence of ischemia and was a low-risk study.  - He denies any recurrent chest pain since his hospitalization in 09/2023. Continue medical therapy with Atenolol  6.25 mg daily and Praluent . Says he has not been on ASA due to thrombocytopenia and platelet count was at 122 K on most recent check. Reviewed options with the patient and we will try restarting ASA 81 mg daily and recheck a CBC in several weeks for reassessment.   2. Hyperlipidemia LDL goal <70 - LDL was previously at 32 in 09/2022 but elevated at 102 when checked in 06/2023. He has been intolerant to statins and remains on Praluent . Was previously holding the medication at times to see if this helped with the dizziness but has since resumed.  3.  Palpitations - Prior monitor showed PAC's and PVC's with brief SVT. He has been on Atenolol  as he was previously intolerant to Lopressor  and Diltiazem . Continue low-dose Atenolol  6.25 mg daily for now (he has preferred to cut this into quarters).  4. Dizziness/Fatigue - He reports intermittent dizziness and fatigue for over the past year and his dizziness typically worsens with food consumption. Symptoms are not consistent with orthostasis by his description. He also previously held Atenolol  for over a month with no significant change but developed worsening palpitations. Says that steroids previously helped his symptoms but he developed worsening palpitations with these. - I do not feel that his dizziness is cardiac related given the nature of symptoms as discussed above and he previously held Atenolol  with no change. Would not pursue repeat ischemic evaluation at this time given recent low-risk NST in 09/2023. He does request follow-up labs to be obtained and will include a CBC as outlined above along with BMET and Mg (he requests B12 be added as well).    Signed, Laymon CHRISTELLA Qua, PA-C

## 2024-02-07 NOTE — Patient Instructions (Signed)
 Medication Instructions:   Restart Aspirin  81 mg Daily   *If you need a refill on your cardiac medications before your next appointment, please call your pharmacy*  Lab Work: Your physician recommends that you return for lab work. CBC, BMET, Mg   If you have labs (blood work) drawn today and your tests are completely normal, you will receive your results only by: MyChart Message (if you have MyChart) OR A paper copy in the mail If you have any lab test that is abnormal or we need to change your treatment, we will call you to review the results.  Testing/Procedures: NONE    Follow-Up: At [facility], you and your health needs are our priority.  As part of our continuing mission to provide you with exceptional heart care, our providers are all part of one team.  This team includes your primary Cardiologist (physician) and Advanced Practice Providers or APPs (Physician Assistants and Nurse Practitioners) who all work together to provide you with the care you need, when you need it.  Your next appointment:   6 month(s)  Provider:   Vina Gull, MD    We recommend signing up for the patient portal called MyChart.  Sign up information is provided on this After Visit Summary.  MyChart is used to connect with patients for Virtual Visits (Telemedicine).  Patients are able to view lab/test results, encounter notes, upcoming appointments, etc.  Non-urgent messages can be sent to your provider as well.   To learn more about what you can do with MyChart, go to [email or web address].   Other Instructions Thank you for choosing [facility]!

## 2024-02-22 ENCOUNTER — Encounter: Payer: Self-pay | Admitting: Family Medicine

## 2024-02-22 ENCOUNTER — Ambulatory Visit (INDEPENDENT_AMBULATORY_CARE_PROVIDER_SITE_OTHER): Admitting: Family Medicine

## 2024-02-22 VITALS — BP 134/73 | HR 67 | Temp 96.6°F | Ht 71.0 in | Wt 191.0 lb

## 2024-02-22 DIAGNOSIS — R5381 Other malaise: Secondary | ICD-10-CM

## 2024-02-22 DIAGNOSIS — R110 Nausea: Secondary | ICD-10-CM | POA: Diagnosis not present

## 2024-02-22 DIAGNOSIS — E782 Mixed hyperlipidemia: Secondary | ICD-10-CM

## 2024-02-22 DIAGNOSIS — R6889 Other general symptoms and signs: Secondary | ICD-10-CM | POA: Diagnosis not present

## 2024-02-22 DIAGNOSIS — I10 Essential (primary) hypertension: Secondary | ICD-10-CM | POA: Diagnosis not present

## 2024-02-22 DIAGNOSIS — R5383 Other fatigue: Secondary | ICD-10-CM | POA: Diagnosis not present

## 2024-02-22 DIAGNOSIS — R251 Tremor, unspecified: Secondary | ICD-10-CM

## 2024-02-22 DIAGNOSIS — R519 Headache, unspecified: Secondary | ICD-10-CM | POA: Diagnosis not present
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
## 2024-02-23 ENCOUNTER — Ambulatory Visit: Payer: Self-pay | Admitting: Family Medicine

## 2024-02-23 DIAGNOSIS — N289 Disorder of kidney and ureter, unspecified: Secondary | ICD-10-CM

## 2024-02-27 ENCOUNTER — Telehealth: Payer: Self-pay

## 2024-02-27 NOTE — Telephone Encounter (Signed)
 Copied from CRM [id number]. Topic: Referral - Question >> Feb 27, 2024  1:37 PM Mia F wrote: Reason for CRM: Randine from [facility] sees pt and says ppt was told to see hematology for his most recent lab results. Randine say pt does not have a hematologist. She wants to know if one would be referred to pt and if he would be scheduled with one. Please call Randine or pt at [phone]

## 2024-02-28 ENCOUNTER — Telehealth: Payer: Self-pay | Admitting: Family Medicine

## 2024-02-28 DIAGNOSIS — D696 Thrombocytopenia, unspecified: Secondary | ICD-10-CM

## 2024-02-28 NOTE — Telephone Encounter (Signed)
 Copied from CRM [id number]. Topic: Referral - Status >> Feb 28, 2024 11:44 AM Roselie BROCKS wrote: Reason for CRM: Patient was told by the provider he needs to go see a hematologist and patient is inquiring if he needs a referral ,and if so has a referral been put in, and if has where to.

## 2024-03-01 ENCOUNTER — Other Ambulatory Visit

## 2024-03-01 DIAGNOSIS — N289 Disorder of kidney and ureter, unspecified: Secondary | ICD-10-CM

## 2024-03-01 NOTE — Telephone Encounter (Unsigned)
 Copied from CRM #[id number]. Topic: Clinical - Lab/Test Results >> Mar 01, 2024  9:42 AM Harlene ORN wrote: Reason for CRM: Patient called needing the information to the referred Hematology Group. Explained to patient his Platelets levels, wants to know his Thyroid  levels? Please explain that the patient.

## 2024-03-02 ENCOUNTER — Ambulatory Visit: Payer: Self-pay | Admitting: Family Medicine

## 2024-03-02 ENCOUNTER — Telehealth: Payer: Self-pay

## 2024-03-02 LAB — CBC WITH DIFFERENTIAL/PLATELET
Basophils Absolute: 0.1 x10E3/uL (ref 0.0–0.2)
Basos: 1 %
EOS (ABSOLUTE): 0.2 x10E3/uL (ref 0.0–0.4)
Eos: 3 %
Hematocrit: 48.5 % (ref 37.5–51.0)
Hemoglobin: 16.5 g/dL (ref 13.0–17.7)
Immature Grans (Abs): 0 x10E3/uL (ref 0.0–0.1)
Immature Granulocytes: 0 %
Lymphocytes Absolute: 1.3 x10E3/uL (ref 0.7–3.1)
Lymphs: 18 %
MCH: 32.7 pg (ref 26.6–33.0)
MCHC: 34 g/dL (ref 31.5–35.7)
MCV: 96 fL (ref 79–97)
Monocytes Absolute: 0.7 x10E3/uL (ref 0.1–0.9)
Monocytes: 9 %
Neutrophils Absolute: 5.1 x10E3/uL (ref 1.4–7.0)
Neutrophils: 69 %
Platelets: 111 x10E3/uL — ABNORMAL LOW (ref 150–450)
RBC: 5.05 x10E6/uL (ref 4.14–5.80)
RDW: 12.9 % (ref 11.6–15.4)
WBC: 7.3 x10E3/uL (ref 3.4–10.8)

## 2024-03-02 LAB — ANEMIA PROFILE B
Basophils Absolute: 0.1 x10E3/uL (ref 0.0–0.2)
Basos: 1 %
EOS (ABSOLUTE): 0.1 x10E3/uL (ref 0.0–0.4)
Eos: 2 %
Ferritin: 49 ng/mL (ref 30–400)
Folate: 11.1 ng/mL (ref >3.0)
Hematocrit: 48.3 % (ref 37.5–51.0)
Hemoglobin: 16.8 g/dL (ref 13.0–17.7)
Immature Grans (Abs): 0 x10E3/uL (ref 0.0–0.1)
Immature Granulocytes: 0 %
Iron Saturation: 38 % (ref 15–55)
Iron: 132 ug/dL (ref 38–169)
Lymphocytes Absolute: 1.3 x10E3/uL (ref 0.7–3.1)
Lymphs: 20 %
MCH: 33.7 pg — ABNORMAL HIGH (ref 26.6–33.0)
MCHC: 34.8 g/dL (ref 31.5–35.7)
MCV: 97 fL (ref 79–97)
Monocytes Absolute: 0.6 x10E3/uL (ref 0.1–0.9)
Monocytes: 9 %
Neutrophils Absolute: 4.4 x10E3/uL (ref 1.4–7.0)
Neutrophils: 68 %
Platelets: 115 x10E3/uL — ABNORMAL LOW (ref 150–450)
RBC: 4.99 x10E6/uL (ref 4.14–5.80)
RDW: 13.1 % (ref 11.6–15.4)
Retic Ct Pct: 1.4 % (ref 0.6–2.6)
Total Iron Binding Capacity: 346 ug/dL (ref 250–450)
UIBC: 214 ug/dL (ref 111–343)
Vitamin B-12: 311 pg/mL (ref 232–1245)
WBC: 6.5 x10E3/uL (ref 3.4–10.8)

## 2024-03-02 LAB — LIPID PANEL
Chol/HDL Ratio: 2.7 ratio (ref 0.0–5.0)
Cholesterol, Total: 96 mg/dL — ABNORMAL LOW (ref 100–199)
HDL: 36 mg/dL — ABNORMAL LOW (ref >39)
LDL Chol Calc (NIH): 30 mg/dL (ref 0–99)
Triglycerides: 186 mg/dL — ABNORMAL HIGH (ref 0–149)
VLDL Cholesterol Cal: 30 mg/dL (ref 5–40)

## 2024-03-02 LAB — CMP14+EGFR
ALT: 17 IU/L (ref 0–44)
AST: 20 IU/L (ref 0–40)
Albumin: 4.2 g/dL (ref 3.7–4.7)
Alkaline Phosphatase: 61 IU/L (ref 44–121)
BUN/Creatinine Ratio: 13 (ref 10–24)
BUN: 18 mg/dL (ref 8–27)
Bilirubin Total: 0.9 mg/dL (ref 0.0–1.2)
CO2: 24 mmol/L (ref 20–29)
Calcium: 9.2 mg/dL (ref 8.6–10.2)
Chloride: 104 mmol/L (ref 96–106)
Creatinine, Ser: 1.4 mg/dL — ABNORMAL HIGH (ref 0.76–1.27)
Globulin, Total: 2 g/dL (ref 1.5–4.5)
Glucose: 93 mg/dL (ref 70–99)
Potassium: 4.3 mmol/L (ref 3.5–5.2)
Sodium: 141 mmol/L (ref 134–144)
Total Protein: 6.2 g/dL (ref 6.0–8.5)
eGFR: 49 mL/min/1.73 — ABNORMAL LOW (ref >59)

## 2024-03-02 LAB — TSH+T3+FREE T4+T3 FREE
Free T-3: 2.6 pg/mL
Free T4 by Dialysis: 0.97 ng/dL
TSH: 3.8 uU/mL
Triiodothyronine (T-3), Serum: 85 ng/dL

## 2024-03-02 LAB — BASIC METABOLIC PANEL WITH GFR
BUN/Creatinine Ratio: 11 (ref 10–24)
BUN: 15 mg/dL (ref 8–27)
CO2: 24 mmol/L (ref 20–29)
Calcium: 9.1 mg/dL (ref 8.6–10.2)
Chloride: 102 mmol/L (ref 96–106)
Creatinine, Ser: 1.36 mg/dL — ABNORMAL HIGH (ref 0.76–1.27)
Glucose: 101 mg/dL — ABNORMAL HIGH (ref 70–99)
Potassium: 4.4 mmol/L (ref 3.5–5.2)
Sodium: 140 mmol/L (ref 134–144)
eGFR: 50 mL/min/1.73 — ABNORMAL LOW (ref >59)

## 2024-03-02 NOTE — Telephone Encounter (Signed)
 Refer to labs note

## 2024-03-02 NOTE — Telephone Encounter (Signed)
 R/C to Patient at [phone] # and phone rang busy.  If Patient calls back please inform Patient that Referral has been sent to ...  [facility] [address] - [place]  [phone]

## 2024-03-02 NOTE — Telephone Encounter (Signed)
 Copied from CRM [id number]. Topic: Clinical - Lab/Test Results >> Mar 02, 2024  9:39 AM Tonda B wrote: Reason for CRM: patient would like a call back to discuss his bloodwork please call pt [phone] (H)

## 2024-03-09 ENCOUNTER — Telehealth: Payer: Self-pay | Admitting: Nurse Practitioner

## 2024-03-09 NOTE — Telephone Encounter (Signed)
 PT is not feeling well at all. Will not be able to make it into any appointments in the morning. New appt and day confirmed.

## 2024-03-13 ENCOUNTER — Inpatient Hospital Stay

## 2024-03-13 ENCOUNTER — Inpatient Hospital Stay: Attending: Oncology | Admitting: Oncology

## 2024-03-13 VITALS — BP 136/68 | HR 60 | Temp 97.8°F | Resp 18 | Ht 71.0 in | Wt 190.6 lb

## 2024-03-13 DIAGNOSIS — Z87891 Personal history of nicotine dependence: Secondary | ICD-10-CM | POA: Diagnosis not present

## 2024-03-13 DIAGNOSIS — C679 Malignant neoplasm of bladder, unspecified: Secondary | ICD-10-CM

## 2024-03-13 DIAGNOSIS — Z809 Family history of malignant neoplasm, unspecified: Secondary | ICD-10-CM | POA: Diagnosis not present

## 2024-03-13 DIAGNOSIS — D696 Thrombocytopenia, unspecified: Secondary | ICD-10-CM | POA: Diagnosis not present

## 2024-03-13 DIAGNOSIS — Z79899 Other long term (current) drug therapy: Secondary | ICD-10-CM | POA: Diagnosis not present

## 2024-03-13 DIAGNOSIS — Z8551 Personal history of malignant neoplasm of bladder: Secondary | ICD-10-CM | POA: Diagnosis not present

## 2024-03-13 LAB — IMMATURE PLATELET FRACTION: Immature Platelet Fraction: 5.1 % (ref 1.2–8.6)

## 2024-03-13 LAB — CMP (CANCER CENTER ONLY)
ALT: 17 U/L (ref 0–44)
AST: 23 U/L (ref 15–41)
Albumin: 4.4 g/dL (ref 3.5–5.0)
Alkaline Phosphatase: 59 U/L (ref 38–126)
Anion gap: 10 (ref 5–15)
BUN: 17 mg/dL (ref 8–23)
CO2: 26 mmol/L (ref 22–32)
Calcium: 9.9 mg/dL (ref 8.9–10.3)
Chloride: 105 mmol/L (ref 98–111)
Creatinine: 1.35 mg/dL — ABNORMAL HIGH (ref 0.61–1.24)
GFR, Estimated: 51 mL/min — ABNORMAL LOW (ref >60)
Glucose, Bld: 100 mg/dL — ABNORMAL HIGH (ref 70–99)
Potassium: 4.9 mmol/L (ref 3.5–5.1)
Sodium: 141 mmol/L (ref 135–145)
Total Bilirubin: 0.8 mg/dL (ref 0.0–1.2)
Total Protein: 7.2 g/dL (ref 6.5–8.1)

## 2024-03-13 LAB — CBC WITH DIFFERENTIAL (CANCER CENTER ONLY)
Abs Immature Granulocytes: 0.01 K/uL (ref 0.00–0.07)
Basophils Absolute: 0.1 K/uL (ref 0.0–0.1)
Basophils Relative: 1 %
Eosinophils Absolute: 0.2 K/uL (ref 0.0–0.5)
Eosinophils Relative: 3 %
HCT: 47.6 % (ref 39.0–52.0)
Hemoglobin: 16.4 g/dL (ref 13.0–17.0)
Immature Granulocytes: 0 %
Lymphocytes Relative: 22 %
Lymphs Abs: 1.4 K/uL (ref 0.7–4.0)
MCH: 32.8 pg (ref 26.0–34.0)
MCHC: 34.5 g/dL (ref 30.0–36.0)
MCV: 95.2 fL (ref 80.0–100.0)
Monocytes Absolute: 0.8 K/uL (ref 0.1–1.0)
Monocytes Relative: 12 %
Neutro Abs: 4.1 K/uL (ref 1.7–7.7)
Neutrophils Relative %: 62 %
Platelet Count: 130 K/uL — ABNORMAL LOW (ref 150–400)
RBC: 5 MIL/uL (ref 4.22–5.81)
RDW: 13.7 % (ref 11.5–15.5)
WBC Count: 6.5 K/uL (ref 4.0–10.5)
nRBC: 0 % (ref 0.0–0.2)

## 2024-03-13 LAB — HEPATITIS PANEL, ACUTE
HCV Ab: NONREACTIVE
Hep A IgM: NONREACTIVE
Hep B C IgM: NONREACTIVE
Hepatitis B Surface Ag: NONREACTIVE
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
## 2024-03-15 ENCOUNTER — Encounter: Admitting: Nurse Practitioner

## 2024-03-15 LAB — METHYLMALONIC ACID, SERUM: Methylmalonic Acid, Quantitative: 197 nmol/L (ref 0–378)

## 2024-03-27 ENCOUNTER — Inpatient Hospital Stay: Attending: Oncology | Admitting: Oncology

## 2024-03-27 ENCOUNTER — Encounter: Payer: Self-pay | Admitting: Oncology

## 2024-03-27 DIAGNOSIS — D696 Thrombocytopenia, unspecified: Secondary | ICD-10-CM | POA: Diagnosis not present
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
## 2024-03-27 NOTE — Assessment & Plan Note (Addendum)
 Chronic thrombocytopenia with platelet counts consistently between 120,000 to 140,000, recently dropping to 111,000.   Platelet counts have been low since at least 2010, with a nadir of 67,000 in 2019.   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   No current bleeding issues, and platelet count has not been below 30,000, which would necessitate treatment.  Labs today showed stable platelet count of 130,000.  White count and hemoglobin are within normal limits.  Creatinine 1.35, otherwise grossly unremarkable CMP.  Iron studies, B12, folic acid were all within normal limits on 02/22/2024.  Hence these were not checked today.  Methylmalonic acid, hepatitis panel pending.  - Discussed that treatment is not required unless platelet count drops below 30,000 or if bleeding issues arise.  - Potential treatment with steroids like prednisone  if necessary, but currently not recommended due to side effects experienced previously.  - Monitor platelet counts and symptoms.  - Consider B12 injections if methylmalonic acid is elevated.  -I will discuss results of remaining workup over the phone in 2 weeks and schedule a follow-up appointment in four months with repeat labs.

## 2024-03-27 NOTE — Assessment & Plan Note (Addendum)
 Nonmuscle invasive based on history.  Diagnosed initially in 2010.  Treated initially with BCG.  Later underwent TURBT x 3, last 1 in 2023.  Has close follow-up with urology clinic.

## 2024-03-28 ENCOUNTER — Telehealth: Payer: Self-pay

## 2024-03-28 ENCOUNTER — Other Ambulatory Visit: Payer: Self-pay | Admitting: Family Medicine

## 2024-03-28 ENCOUNTER — Encounter (INDEPENDENT_AMBULATORY_CARE_PROVIDER_SITE_OTHER): Payer: Self-pay | Admitting: Gastroenterology

## 2024-03-28 ENCOUNTER — Other Ambulatory Visit: Payer: Self-pay | Admitting: Internal Medicine

## 2024-03-28 DIAGNOSIS — K219 Gastro-esophageal reflux disease without esophagitis: Secondary | ICD-10-CM

## 2024-03-28 NOTE — Telephone Encounter (Signed)
 Fax [phone], Attn: Team 2, regarding [facility] office note for [facility].

## 2024-04-03 ENCOUNTER — Encounter (INDEPENDENT_AMBULATORY_CARE_PROVIDER_SITE_OTHER): Payer: Self-pay | Admitting: Gastroenterology

## 2024-04-22 ENCOUNTER — Emergency Department (HOSPITAL_COMMUNITY)
Admission: EM | Admit: 2024-04-22 | Discharge: 2024-04-23 | Disposition: A | Attending: Emergency Medicine | Admitting: Emergency Medicine

## 2024-04-22 ENCOUNTER — Encounter (HOSPITAL_COMMUNITY): Payer: Self-pay | Admitting: Emergency Medicine

## 2024-04-22 ENCOUNTER — Other Ambulatory Visit: Payer: Self-pay

## 2024-04-22 DIAGNOSIS — S51811A Laceration without foreign body of right forearm, initial encounter: Secondary | ICD-10-CM | POA: Insufficient documentation

## 2024-04-22 DIAGNOSIS — I10 Essential (primary) hypertension: Secondary | ICD-10-CM | POA: Insufficient documentation

## 2024-04-22 DIAGNOSIS — S61411A Laceration without foreign body of right hand, initial encounter: Secondary | ICD-10-CM

## 2024-04-22 DIAGNOSIS — I2510 Atherosclerotic heart disease of native coronary artery without angina pectoris: Secondary | ICD-10-CM | POA: Diagnosis not present

## 2024-04-22 DIAGNOSIS — Z7982 Long term (current) use of aspirin: Secondary | ICD-10-CM | POA: Insufficient documentation

## 2024-04-22 DIAGNOSIS — Y9389 Activity, other specified: Secondary | ICD-10-CM | POA: Insufficient documentation

## 2024-04-22 DIAGNOSIS — W312XXA Contact with powered woodworking and forming machines, initial encounter: Secondary | ICD-10-CM | POA: Diagnosis not present

## 2024-04-22 DIAGNOSIS — Y92001 Dining room of unspecified non-institutional (private) residence as the place of occurrence of the external cause: Secondary | ICD-10-CM | POA: Diagnosis not present

## 2024-04-22 MED ORDER — TETANUS-DIPHTH-ACELL PERTUSSIS 5-2-15.5 LF-MCG/0.5 IM SUSP
0.5000 mL | Freq: Once | INTRAMUSCULAR | Status: DC
  Filled 2024-04-22: qty 0.5

## 2024-04-22 MED ORDER — LIDOCAINE-EPINEPHRINE (PF) 2 %-1:200000 IJ SOLN
10.0000 mL | Freq: Once | INTRAMUSCULAR | Status: DC
Start: 2024-04-22 — End: 2024-04-23
  Filled 2024-04-22: qty 20

## 2024-04-22 NOTE — ED Provider Notes (Signed)
  EMERGENCY DEPARTMENT AT [facility] Provider Note   CSN: [id number] Arrival date & time: 04/22/24  2241     Patient presents with: Laceration   Ronald Lupita Sharps Sr. is a 89 y.o. male.  {Add pertinent medical, surgical, social history, OB history to YEP:67052}  Laceration Patient presents for right hand laceration.  Medical history includes HTN, CAD, GERD, anxiety, thrombocytopenia..  Earlier today, he was in his normal state of health.  While doing a project at home, he caught the right thenar eminence on a piece of wood.  This did cause a laceration.  He has been cleaning it with antiseptic at home.  He has had recurrent bleeding when it gets touched.  He denies any other injuries.     Prior to Admission medications   Medication Sig Start Date End Date Taking? Authorizing Provider  acetaminophen  (TYLENOL ) 500 MG tablet Take 500 mg by mouth as needed.    [provider]  Alirocumab  (PRALUENT ) 75 MG/ML SOAJ Inject 1 mL (75 mg total) into the skin every 14 (fourteen) days. 01/19/23   Okey Vina GAILS, MD  aspirin  EC 81 MG tablet Take 1 tablet (81 mg total) by mouth daily. Swallow whole. 02/07/24   Strader, Laymon HERO, PA-C  atenolol  (TENORMIN ) 25 MG tablet TAKE 1/4 TABLET BY MOUTH DAILY. MAY TAKE ADDITIONAL 1/4 TABLET DAILY AS NEEDED FOR PALPITATIONS 03/30/24   Okey Vina GAILS, MD  fluocinonide  cream (LIDEX ) 0.05 % APPLY TO AFFECTED AREA TWICE A DAY 03/04/23   Gladis, Mary-Margaret, FNP  hydrocortisone  (ANUSOL -HC) 25 MG suppository PLACE 1 SUPPOSITORY RECTALLY 2 TIMES DAILY. 06/17/22   Gladis, Mary-Margaret, FNP  lansoprazole  (PREVACID ) 15 MG capsule TAKE 1 CAPSULE BY MOUTH 2 TIMES DAILY BEFORE A MEAL. 03/29/24   Severa Rock HERO, FNP  nitroGLYCERIN  (NITROSTAT ) 0.4 MG SL tablet Place 1 tablet (0.4 mg total) under the tongue every 5 (five) minutes x 3 doses as needed for chest pain. 09/28/23   Darryle Thom CROME, PA-C  Potassium 99 MG TABS Take 1 tablet by mouth every other day.     [provider]  psyllium (METAMUCIL SMOOTH TEXTURE) 58.6 % powder Take 1 packet by mouth 3 (three) times daily. 07/08/23   Severa Rock HERO, FNP  VITAMIN D  PO Take 1 tablet by mouth daily.    [provider]    Allergies: Uloric  [febuxostat ], Cholestyramine , Doxazosin, Doxycycline , Omeprazole , Pantoprazole , Pravastatin , Rosuvastatin , Zofran  [ondansetron ], Augmentin  [amoxicillin -pot clavulanate], Ciprofloxacin , Codeine, Levaquin  [levofloxacin ], Oxycodone , Tramadol , and Vicodin [hydrocodone-acetaminophen ]    Review of Systems  Skin:  Positive for wound.  All other systems reviewed and are negative.   Updated Vital Signs BP (!) 146/76 (BP Location: Right Arm)   Pulse 64   Temp 98.5 F (36.9 C) (Oral)   Resp 18   Wt 86.5 kg   SpO2 93%   BMI 26.60 kg/m   Physical Exam Vitals and nursing note reviewed.  Constitutional:      General: He is not in acute distress.    Appearance: Normal appearance. He is well-developed. He is not ill-appearing, toxic-appearing or diaphoretic.  HENT:     Head: Normocephalic and atraumatic.     Right Ear: External ear normal.     Left Ear: External ear normal.     Nose: Nose normal.     Mouth/Throat:     Mouth: Mucous membranes are moist.  Eyes:     Extraocular Movements: Extraocular movements intact.     Conjunctiva/sclera: Conjunctivae normal.  Cardiovascular:  Rate and Rhythm: Normal rate and regular rhythm.  Pulmonary:     Effort: Pulmonary effort is normal. No respiratory distress.  Abdominal:     General: There is no distension.     Palpations: Abdomen is soft.     Tenderness: There is no abdominal tenderness.  Musculoskeletal:        General: No swelling. Normal range of motion.     Cervical back: Normal range of motion and neck supple.  Skin:    General: Skin is warm and dry.     Coloration: Skin is not jaundiced or pale.  Neurological:     General: No focal deficit present.     Mental Status: He is alert and  oriented to person, place, and time.  Psychiatric:        Mood and Affect: Mood normal.        Behavior: Behavior normal.     (all labs ordered are listed, but only abnormal results are displayed) Labs Reviewed - No data to display  EKG: None  Radiology: No results found.  {Document cardiac monitor, telemetry assessment procedure when appropriate:32947} Procedures   Medications Ordered in the ED - No data to display    {Click here for ABCD2, HEART and other calculators REFRESH Note before signing:1}                              Medical Decision Making  Patient presents for right hand laceration.This occurred at 2 PM this afternoon.  He has had recurrence of bleeding when touched.  Vital signs on arrival are normal.  Patient is well-appearing on exam.  Wound is currently hemostatic.  Tdap was ordered for tetanus prophylaxis.***  {Document critical care time when appropriate  Document review of labs and clinical decision tools ie CHADS2VASC2, etc  Document your independent review of radiology images and any outside records  Document your discussion with family members, caretakers and with consultants  Document social determinants of health affecting pt's care  Document your decision making why or why not admission, treatments were needed:32947:::1}   Final diagnoses:  None    ED Discharge Orders     None

## 2024-04-22 NOTE — ED Triage Notes (Signed)
 Pt presents with laceration to right palm. Pt was using a saw and the saw broke and a piece of the saw cut hand. Happened today at 2pm. Bleeding controlled. Unknown last tetanus shot. Denies pain

## 2024-04-23 NOTE — Discharge Instructions (Signed)
 Stitches can come out in 7 to 10 days.  Keep area clean and dry.  If area becomes increasingly red, painful, swollen, or drains pus, return to the emergency department.

## 2024-04-28 ENCOUNTER — Encounter: Payer: Self-pay | Admitting: Oncology

## 2024-04-28 NOTE — Assessment & Plan Note (Signed)
 Chronic thrombocytopenia with platelet counts consistently between 120,000 to 140,000, recently dropping to 111,000.   Platelet counts have been low since at least 2010, with a nadir of 67,000 in 2019.    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    No current bleeding issues, and platelet count has not been below 30,000, which would necessitate treatment.   On his consultation with us  on 03/13/2024, labs actually showed improved platelet count of 130,000.  White count and hemoglobin were within normal limits.  Creatinine 1.35, otherwise grossly unremarkable CMP. Methylmalonic acid was normal. Hepatitis panel negative.    Discussed that treatment is not required unless platelet count drops below 30,000 or if bleeding issues arise.   Potential treatment with steroids like prednisone  if necessary, but currently not recommended due to side effects experienced previously.   Monitor platelet counts and symptoms.

## 2024-05-24 ENCOUNTER — Telehealth: Payer: Self-pay | Admitting: Internal Medicine

## 2024-05-24 NOTE — Telephone Encounter (Signed)
 Patient notified and verbalized understanding. Patient will call office in 2-3 days to let office know how he is feeling.

## 2024-05-24 NOTE — Telephone Encounter (Signed)
° °  While Praluent  can cause a headache, this should not cause variations in his blood pressure or heart rate. He did have PAC's and PVC's by prior monitoring and if having more frequent episodes of these, this can register as his heart rate being on the low side. He is already on a low-dose of Atenolol  and was taken a fourth of a tablet at the time of his last visit. Would recommend holding this for now to see if symptoms improve.  Signed, Laymon CHRISTELLA Qua, PA-C 05/24/2024, 4:10 PM Pager: [phone]

## 2024-05-24 NOTE — Telephone Encounter (Signed)
 Spoke to patient who stated that he has been fatigued for the last month. Pt stated hr has been fluctuating between 43-72 bpm. Pt stated last night his bp was 115/55 hr- 53. Pt stated that he believes his issue is coming from his Praluent  shots. Pt stated that for 2 days after taking shots, he experiences a headache. Pt denies SOB/CP/N/V.    Please advise.

## 2024-05-24 NOTE — Telephone Encounter (Signed)
 Patient c/o Palpitations: STAT if patient c/o lightheadedness, shortness of breath, or chest pain  How long have you had palpitations/irregular HR/ Afib? Are you having the symptoms now? Palpitations   Are you currently experiencing lightheadedness, SOB or CP? No   Do you have a history of afib (atrial fibrillation) or irregular heart rhythm?   Have you checked your BP or HR? (document readings if available):   hr 53 today and bp 112/50.  Yesterday hr was 43   Are you experiencing any other symptoms?  Fatigue

## 2024-05-29 MED ORDER — METOPROLOL SUCCINATE ER 25 MG PO TB24: 12.5000 mg | ORAL_TABLET | Freq: Every day | ORAL | 3 refills | Status: AC

## 2024-05-29 NOTE — Telephone Encounter (Signed)
 Pt returning call. Please advise.

## 2024-05-29 NOTE — Telephone Encounter (Signed)
 Spoke to patient who verbalized understanding. Patient would like to retry Toprol  XL 12.5 mg once daily and see if this helps. Pt advised that if he has a bad reaction to medication, to call our office or on call MD after hours. Pt had no further questions or concerns at this time.

## 2024-05-29 NOTE — Telephone Encounter (Signed)
 Spoke to pt who stated that he stopped the Atenolol  on Friday. Saturday/Sunday he experienced brief episodes of cp and chest tightness. Pt stated that he took his Atenolol  and this helped him. Pt stated that when he did not take Atenolol  his dizziness was noticeably better. Pt is asking if there is a comparable medication that he can try.   Please advise.

## 2024-05-29 NOTE — Addendum Note (Signed)
 Addended by: KENETH KNEE C on: 05/29/2024 12:31 PM   Modules accepted: Orders

## 2024-05-29 NOTE — Telephone Encounter (Signed)
° °  By review of prior notes, he was previously intolerant to short-acting Lopressor  and short-acting Cardizem . It appears he was on Toprol -XL in the past and I am unsure why this was stopped. Could retry Toprol -XL but would do so at a very low-dose of 12.5 mg daily and can titrate if able to tolerate over time. If he does not wish to try Toprol -XL again, could try Propranolol 10mg  BID. Both are in the same class of medications as Atenolol  and should help with palpitations.  Signed, Laymon CHRISTELLA Qua, PA-C 05/29/2024, 10:58 AM Pager: [phone]

## 2024-05-31 ENCOUNTER — Emergency Department (HOSPITAL_COMMUNITY)

## 2024-05-31 ENCOUNTER — Encounter (HOSPITAL_COMMUNITY): Payer: Self-pay

## 2024-05-31 ENCOUNTER — Ambulatory Visit: Payer: Self-pay

## 2024-05-31 ENCOUNTER — Other Ambulatory Visit: Payer: Self-pay

## 2024-05-31 ENCOUNTER — Emergency Department (HOSPITAL_COMMUNITY)
Admission: EM | Admit: 2024-05-31 | Discharge: 2024-05-31 | Disposition: A | Discharge status: Home / Self Care | Attending: Emergency Medicine | Admitting: Emergency Medicine

## 2024-05-31 DIAGNOSIS — I2510 Atherosclerotic heart disease of native coronary artery without angina pectoris: Secondary | ICD-10-CM | POA: Diagnosis not present

## 2024-05-31 DIAGNOSIS — I10 Essential (primary) hypertension: Secondary | ICD-10-CM | POA: Insufficient documentation

## 2024-05-31 DIAGNOSIS — N451 Epididymitis: Secondary | ICD-10-CM | POA: Diagnosis not present

## 2024-05-31 DIAGNOSIS — Z79899 Other long term (current) drug therapy: Secondary | ICD-10-CM | POA: Diagnosis not present

## 2024-05-31 DIAGNOSIS — Z7982 Long term (current) use of aspirin: Secondary | ICD-10-CM | POA: Insufficient documentation

## 2024-05-31 DIAGNOSIS — Z8551 Personal history of malignant neoplasm of bladder: Secondary | ICD-10-CM | POA: Diagnosis not present

## 2024-05-31 DIAGNOSIS — R309 Painful micturition, unspecified: Secondary | ICD-10-CM | POA: Diagnosis present

## 2024-05-31 LAB — BASIC METABOLIC PANEL WITH GFR
Anion gap: 12 (ref 5–15)
BUN: 23 mg/dL (ref 8–23)
CO2: 24 mmol/L (ref 22–32)
Calcium: 9 mg/dL (ref 8.9–10.3)
Chloride: 104 mmol/L (ref 98–111)
Creatinine, Ser: 1.44 mg/dL — ABNORMAL HIGH (ref 0.61–1.24)
GFR, Estimated: 47 mL/min — ABNORMAL LOW (ref >60)
Glucose, Bld: 87 mg/dL (ref 70–99)
Potassium: 4.3 mmol/L (ref 3.5–5.1)
Sodium: 140 mmol/L (ref 135–145)

## 2024-05-31 LAB — URINALYSIS, ROUTINE W REFLEX MICROSCOPIC
Bacteria, UA: NONE SEEN
Bilirubin Urine: NEGATIVE
Glucose, UA: NEGATIVE mg/dL
Ketones, ur: NEGATIVE mg/dL
Nitrite: NEGATIVE
Protein, ur: NEGATIVE mg/dL
Specific Gravity, Urine: 1.01 (ref 1.005–1.030)
pH: 5 (ref 5.0–8.0)

## 2024-05-31 LAB — CBC
HCT: 46.9 % (ref 39.0–52.0)
Hemoglobin: 16.3 g/dL (ref 13.0–17.0)
MCH: 33.2 pg (ref 26.0–34.0)
MCHC: 34.8 g/dL (ref 30.0–36.0)
MCV: 95.5 fL (ref 80.0–100.0)
Platelets: 121 K/uL — ABNORMAL LOW (ref 150–400)
RBC: 4.91 MIL/uL (ref 4.22–5.81)
RDW: 13.2 % (ref 11.5–15.5)
WBC: 6.9 K/uL (ref 4.0–10.5)
nRBC: 0 % (ref 0.0–0.2)

## 2024-05-31 MED ORDER — LEVOFLOXACIN 500 MG PO TABS: 500.0000 mg | ORAL_TABLET | Freq: Every day | ORAL | 0 refills | Status: AC

## 2024-05-31 MED ORDER — PROMETHAZINE HCL 12.5 MG RE SUPP: 12.5000 mg | Freq: Three times a day (TID) | RECTAL | 0 refills | Status: AC | PRN

## 2024-05-31 NOTE — Telephone Encounter (Signed)
°  FYI Only or Action Required?: FYI only for provider: ED advised.  Patient was last seen in primary care on 02/22/2024 by Severa Rock HERO, FNP.  Called Nurse Triage reporting Dysuria.  Symptoms began yesterday.  Interventions attempted: OTC medications: Tylenol .  Symptoms are: gradually worsening.  Triage Disposition: Go to ED Now (Notify PCP)  Patient/caregiver understands and will follow disposition?: Yes  Copied from CRM [id number]. Topic: Clinical - Red Word Triage >> May 31, 2024  8:46 AM Deaijah H wrote: Red Word that prompted transfer to Nurse Triage: UTI that's pretty bad. Burning real bad before and after urinating. Reason for Disposition  Swollen scrotum  Answer Assessment - Initial Assessment Questions 1. SEVERITY: How bad is the pain?  (e.g., Scale 1-10; mild, moderate, or severe)      5/10  2. FREQUENCY: How many times have you had painful urination today?     When pt urinates    3. PATTERN: Is pain present every time you urinate or just sometimes?       Constant.  Pt states he has pain upon initiating stream  4. ONSET: When did the painful urination start?       Yesterday  5. FEVER: Do you have a fever? If Yes, ask: What is your temperature, how was it measured, and when did it start?      Denies  6. PAST UTI: Have you had a urine infection before? If Yes, ask: When was the last time? and What happened that time?       Denies  7. CAUSE: What do you think is causing the painful urination?       Pt states he is unsure  8. OTHER SYMPTOMS: Pt denies flank pain, penis discharge, blood in urine Pt reports scrotal pain -burning   Pt reports burning upon initiation of urine stream Pt is taking OTC Tylenol  Pt sent to ED for further evaluation and treatment due to complains of scrotum swelling Pt agrees with plan of care, will call back for any worsening symptoms  Answer Assessment - Initial Assessment Questions 1. LOCATION and RADIATION:  Where is the pain located?      Scrotum  2. QUALITY: What does the pain feel like?  (e.g., sharp, dull, aching, burning)      Burning   3. SEVERITY: How bad is the pain?  (Scale 1-10; or mild, moderate, severe)     5/10   4. ONSET: When did the pain start?     Yesterday   5. PATTERN: Does it come and go, or has it been constant since it started?     Constant   6. SCROTAL APPEARANCE: What does the scrotum look like? Is there any swelling or redness?       Pt reports swelling  7. HERNIA: Has a doctor ever told you that you have a hernia?      Denies  8. OTHER SYMPTOMS: Pt denies abdomen pain, difficulty passing urine, fever, vomiting  Protocols used: Urination Pain - Male-A-AH, Scrotum Pain-A-AH

## 2024-05-31 NOTE — Telephone Encounter (Signed)
Noted  -LS

## 2024-05-31 NOTE — Discharge Instructions (Signed)
 You are being placed on an antibiotic for this suspected infection of your epididymis.  As discussed have a serving of probiotic yogurt while taking this medication to help minimize risk of developing diarrhea.  I have also prescribed you Phenergan  suppositories at your request, you may use this if needed for development of any nausea.

## 2024-05-31 NOTE — ED Triage Notes (Addendum)
 Come in POV for complaint of burning with urination that started yesterday was told by PCP to come to ER. Took tylenol  this morning at 10:30.

## 2024-05-31 NOTE — ED Notes (Signed)
 Pt/family received d/c paperwork at this time. After going over the paperwork any questions, comments, or concerns were answered to the best of this nurse's knowledge. The pt/family verbally acknowledged the teachings/instructions.

## 2024-05-31 NOTE — ED Provider Notes (Signed)
 [place] EMERGENCY DEPARTMENT AT [facility] Provider Note   CSN: [id number] Arrival date & time: 05/31/24  1323     Patient presents with: buring with urination    Ronald Illya Gienger Sr. is a 89 y.o. male with a history including hypertension, GERD, CAD, history of temporal arteritis and history of bladder cancer in 2010 presenting for 1 day history of of painful urination.  He describes burning pain with urination which starts in his right scrotum and radiates through his penis and up into his bladder.  The symptoms started yesterday evening.  He denies increased urinary frequency, no fevers or chills, no nausea or vomiting, no penile discharge.  Symptom-free at rest, he can localize a tender knot at the site.  Said no treatment prior to arrival.  He also has recent constipation resulting in a small external hemorrhoid which he describes as itching in character, he is using Anusol  suppositories for this complaint.  {Add pertinent medical, surgical, social history, OB history to YEP:67052} The history is provided by the patient.       Prior to Admission medications  Medication Sig Start Date End Date Taking? Authorizing Provider  acetaminophen  (TYLENOL ) 500 MG tablet Take 500 mg by mouth as needed.    [provider]  Alirocumab  (PRALUENT ) 75 MG/ML SOAJ Inject 1 mL (75 mg total) into the skin every 14 (fourteen) days. 01/19/23   Okey Vina GAILS, MD  aspirin  EC 81 MG tablet Take 1 tablet (81 mg total) by mouth daily. Swallow whole. 02/07/24   Johnson, Laymon HERO, PA-C  fluocinonide  cream (LIDEX ) 0.05 % APPLY TO AFFECTED AREA TWICE A DAY 03/04/23   Gladis, Mary-Margaret, FNP  hydrocortisone  (ANUSOL -HC) 25 MG suppository PLACE 1 SUPPOSITORY RECTALLY 2 TIMES DAILY. 06/17/22   Gladis, Mary-Margaret, FNP  lansoprazole  (PREVACID ) 15 MG capsule TAKE 1 CAPSULE BY MOUTH 2 TIMES DAILY BEFORE A MEAL. 03/29/24   Severa Rock HERO, FNP  metoprolol  succinate (TOPROL  XL) 25 MG 24 hr tablet Take  0.5 tablets (12.5 mg total) by mouth daily. 05/29/24   Strader, Laymon HERO, PA-C  nitroGLYCERIN  (NITROSTAT ) 0.4 MG SL tablet Place 1 tablet (0.4 mg total) under the tongue every 5 (five) minutes x 3 doses as needed for chest pain. 09/28/23   Darryle Thom CROME, PA-C  Potassium 99 MG TABS Take 1 tablet by mouth every other day.    [provider]  psyllium (METAMUCIL SMOOTH TEXTURE) 58.6 % powder Take 1 packet by mouth 3 (three) times daily. 07/08/23   Severa Rock HERO, FNP  VITAMIN D  PO Take 1 tablet by mouth daily.    [provider]    Allergies: Uloric  [febuxostat ], Cholestyramine , Doxazosin, Doxycycline , Omeprazole , Pantoprazole , Pravastatin , Rosuvastatin , Zofran  [ondansetron ], Augmentin  [amoxicillin -pot clavulanate], Ciprofloxacin , Codeine, Levaquin  [levofloxacin ], Oxycodone , Tramadol , and Vicodin [hydrocodone-acetaminophen ]    Review of Systems  Constitutional:  Negative for fever.  HENT:  Negative for congestion and sore throat.   Eyes: Negative.   Respiratory:  Negative for chest tightness and shortness of breath.   Cardiovascular:  Negative for chest pain.  Gastrointestinal:  Negative for abdominal pain and nausea.  Genitourinary:  Positive for dysuria, penile pain and testicular pain. Negative for decreased urine volume, flank pain, frequency, penile discharge, penile swelling and scrotal swelling.  Musculoskeletal:  Negative for arthralgias, joint swelling and neck pain.  Skin: Negative.  Negative for rash and wound.  Neurological:  Negative for dizziness, weakness, light-headedness, numbness and headaches.  Psychiatric/Behavioral: Negative.      Updated  Vital Signs BP 122/69   Pulse 60   Temp 97.8 F (36.6 C) (Oral)   Resp 17   Ht 6' (1.829 m)   Wt 83.9 kg   SpO2 96%   BMI 25.09 kg/m   Physical Exam Vitals and nursing note reviewed. Exam conducted with a chaperone present (RN Tinnie Creighton).  Constitutional:      Appearance: He is well-developed.  HENT:      Head: Normocephalic and atraumatic.  Eyes:     Conjunctiva/sclera: Conjunctivae normal.  Cardiovascular:     Rate and Rhythm: Normal rate and regular rhythm.     Heart sounds: Normal heart sounds.  Pulmonary:     Effort: Pulmonary effort is normal.     Breath sounds: Normal breath sounds. No wheezing.  Abdominal:     General: Bowel sounds are normal.     Palpations: Abdomen is soft.     Tenderness: There is no abdominal tenderness.  Genitourinary:    Testes:        Right: Tenderness present. Mass, swelling, testicular hydrocele or varicocele not present.     Epididymis:     Right: No mass or tenderness.     Comments: TTP right groin at base of scrotum,  no erythema,  no mass appreciated.   Musculoskeletal:        General: Normal range of motion.     Cervical back: Normal range of motion.  Skin:    General: Skin is warm and dry.  Neurological:     Mental Status: He is alert.     (all labs ordered are listed, but only abnormal results are displayed) Labs Reviewed  CBC - Abnormal; Notable for the following components:      Result Value   Platelets 121 (*)    All other components within normal limits  URINALYSIS, ROUTINE W REFLEX MICROSCOPIC  BASIC METABOLIC PANEL WITH GFR    EKG: None  Radiology: No results found.  {Document cardiac monitor, telemetry assessment procedure when appropriate:32947} Procedures   Medications Ordered in the ED - No data to display    {Click here for ABCD2, HEART and other calculators REFRESH Note before signing:1}                              Medical Decision Making Amount and/or Complexity of Data Reviewed Labs: ordered.     {Document critical care time when appropriate  Document review of labs and clinical decision tools ie CHADS2VASC2, etc  Document your independent review of radiology images and any outside records  Document your discussion with family members, caretakers and with consultants  Document social determinants  of health affecting pt's care  Document your decision making why or why not admission, treatments were needed:32947:::1}   Final diagnoses:  None    ED Discharge Orders     None

## 2024-06-12 ENCOUNTER — Ambulatory Visit (INDEPENDENT_AMBULATORY_CARE_PROVIDER_SITE_OTHER): Admitting: Family Medicine

## 2024-06-12 VITALS — BP 127/67 | HR 68 | Temp 97.9°F | Ht 72.0 in | Wt 193.0 lb

## 2024-06-12 DIAGNOSIS — N50812 Left testicular pain: Secondary | ICD-10-CM

## 2024-06-12 DIAGNOSIS — R300 Dysuria: Secondary | ICD-10-CM

## 2024-06-12 DIAGNOSIS — Z8551 Personal history of malignant neoplasm of bladder: Secondary | ICD-10-CM

## 2024-06-12 DIAGNOSIS — N451 Epididymitis: Secondary | ICD-10-CM | POA: Diagnosis not present

## 2024-06-12 LAB — MICROSCOPIC EXAMINATION
Epithelial Cells (non renal): NONE SEEN /HPF (ref 0–10)
RBC, Urine: NONE SEEN /HPF (ref 0–2)
Renal Epithel, UA: NONE SEEN /HPF
Yeast, UA: NONE SEEN

## 2024-06-12 LAB — URINALYSIS, ROUTINE W REFLEX MICROSCOPIC
Bilirubin, UA: NEGATIVE
Glucose, UA: NEGATIVE
Ketones, UA: NEGATIVE
Nitrite, UA: NEGATIVE
Protein,UA: NEGATIVE
Specific Gravity, UA: 1.01 (ref 1.005–1.030)
Urobilinogen, Ur: 0.2 mg/dL (ref 0.2–1.0)
pH, UA: 5.5 (ref 5.0–7.5)

## 2024-06-12 MED ORDER — CEFTRIAXONE SODIUM 1 G IJ SOLR
0.5000 g | Freq: Once | INTRAMUSCULAR | Status: AC
  Administered 2024-06-12: 0.5 g via INTRAMUSCULAR

## 2024-06-12 NOTE — Progress Notes (Signed)
 "  Acute Office Visit  Patient ID: Ronald Russell., male    DOB: 12-Nov-1934, 89 y.o.   MRN: [id number]  PCP: Severa Rock HERO, FNP  Chief Complaint  Patient presents with   ER FOLLOW UP    Patient was seen/treated at [facility] ER on 05/31/2024 for Epididymitis.    Subjective:     HPI  Discussed the use of AI scribe software for clinical note transcription with the patient, who gave verbal consent to proceed.  History of Present Illness   Ronald Russell is an 89 year old male with a history of bladder cancer who presents with burning urination, bladder pressure/pain and testicular pain.  Dysuria and genitourinary pain - 05/31/24 went to the ED for painful urination and painful testicle. Patient observed blood in urine.  - US  of the scrotum revealed mild thickening to the R epididymis.  - UA showed moderate blood and small leuks.  - ED ordered levaquin  for possible epididymitis.  - Burning urination persists despite completing a 10-day course of Levaquin  (levofloxacin ) once daily for presumed epididymitis based on ultrasound findings. - Patient states that the ED mentioned that a second abx could be given if needed. - Stinging sensation over the bladder when wearing underwear - Severe tenderness localized to a specific area on the left testicle - Reports that about 1 month ago was sliding forward in recliner and hurt his testicle.  - Patient declines STD testing.   - Patient states that he called and has an appointment with his urologist tomorrow.   Bladder cancer history - History of bladder cancer many years ago. Followed by urology/oncology   Bowel habit changes - Alternating constipation and diarrhea - Bowel movements vary in consistency and size - Recent episodes of diarrhea yesterday and the day before. - Has seen GI for this, states that they declined to perform colonoscopy due to age.  - Irregular bowel habits persist despite use of Metamucil and  increased fiber intake       ROS     Objective:    BP 127/67 (Cuff Size: Normal)   Pulse 68   Temp 97.9 F (36.6 C)   Ht 6' (1.829 m)   Wt 193 lb (87.5 kg)   SpO2 97%   BMI 26.18 kg/m    Physical Exam Vitals reviewed.  Constitutional:      Appearance: Normal appearance.  HENT:     Head: Normocephalic and atraumatic.  Eyes:     Extraocular Movements: Extraocular movements intact.     Conjunctiva/sclera: Conjunctivae normal.     Pupils: Pupils are equal, round, and reactive to light.  Cardiovascular:     Rate and Rhythm: Normal rate and regular rhythm.     Pulses: Normal pulses.     Heart sounds: Normal heart sounds. No murmur heard. Pulmonary:     Effort: Pulmonary effort is normal.     Breath sounds: Normal breath sounds.  Genitourinary:    Penis: Normal.      Comments: Pinpoint tenderness beneath the L testicle.   No erythema or bruising.   Boggy/mild swelling upon palpation of L testicle.    Musculoskeletal:        General: Normal range of motion.     Cervical back: Normal range of motion.  Skin:    General: Skin is warm and dry.  Neurological:     General: No focal deficit present.     Mental Status: He is alert and oriented  to person, place, and time.  Psychiatric:        Mood and Affect: Mood normal.        Behavior: Behavior normal.       Results for orders placed or performed in visit on 06/12/24  Microscopic Examination   Urine  Result Value Ref Range   WBC, UA 6-10 (A) 0 - 5 /hpf   RBC, Urine None seen 0 - 2 /hpf   Epithelial Cells (non renal) None seen 0 - 10 /hpf   Renal Epithel, UA None seen None seen /hpf   Bacteria, UA Few (A) None seen/Few   Yeast, UA None seen None seen  Urinalysis, Routine w reflex microscopic  Result Value Ref Range   Specific Gravity, UA 1.010 1.005 - 1.030   pH, UA 5.5 5.0 - 7.5   Color, UA Yellow Yellow   Appearance Ur Clear Clear   Leukocytes,UA Trace (A) Negative   Protein,UA Negative  Negative/Trace   Glucose, UA Negative Negative   Ketones, UA Negative Negative   RBC, UA 1+ (A) Negative   Bilirubin, UA Negative Negative   Urobilinogen, Ur 0.2 0.2 - 1.0 mg/dL   Nitrite, UA Negative Negative   Microscopic Examination See below:        Assessment & Plan:   Problem List Items Addressed This Visit       Other   History of bladder cancer   Other Visit Diagnoses       Epididymitis    -  Primary   Relevant Medications   cefTRIAXone (ROCEPHIN) injection 0.5 g     Dysuria       Relevant Medications   cefTRIAXone (ROCEPHIN) injection 0.5 g   Other Relevant Orders   Urinalysis, Routine w reflex microscopic (Completed)   Urine Culture   Microscopic Examination (Completed)     Pain in left testicle           Assessment and Plan    Epididymitis with persistent dysuria Persistent bladder discomfort and left testicular tenderness post-levofloxacin  for epididymitis.  - Urinalysis today shows 1+ blood, trace leukocytes.  - Sent urine culture, results pending.  - Administered ceftriaxone for possible epididymitis.  - Patient has f/u with urologist tomorrow for further evaluation.  - Follow up if symptoms acutely worsen, no improvement over the next 2-3 days, fever develops, or for any other concerns          Meds ordered this encounter  Medications   cefTRIAXone (ROCEPHIN) injection 0.5 g    Return if symptoms worsen or fail to improve.  Oneil LELON Severin, FNP [facility]   "

## 2024-06-13 ENCOUNTER — Encounter: Payer: Self-pay | Admitting: Family Medicine

## 2024-06-15 ENCOUNTER — Ambulatory Visit: Payer: Self-pay | Admitting: Family Medicine

## 2024-06-15 LAB — URINE CULTURE

## 2024-06-19 ENCOUNTER — Ambulatory Visit: Payer: Self-pay

## 2024-06-19 NOTE — Telephone Encounter (Signed)
 FYI Only or Action Required?: FYI only for provider: appointment scheduled on 1.7.25.  Patient was last seen in primary care on 06/12/2024 by Alcus Oneil ORN, FNP.  Called Nurse Triage reporting Dysuria and Pain.  Symptoms began several days ago.  Interventions attempted: OTC medications: tylenol  and Prescription medications: bactrim - stopped due to side effects.  Symptoms are: started to get better after shot in the office, now worse again.  Triage Disposition: See Physician Within 24 Hours  Patient/caregiver understands and will follow disposition?: Yes      Summary: urination   Reason for Triage: Patient called. Was seen at [facility] and referred to urologists. States he tested negative for UTI but is still having the same symptoms. Not worse but not better about the same. Burning with urination. Would like to know if he should come back into [facility] or what he should do. No visible blood but test showed traced but states provider was not worried about that.       Reason for Disposition  [1] MILD to MODERATE pain AND [2] comes and goes (intermittent; brief episodes less than one hour long) AND [3] present > 24 hours  Answer Assessment - Initial Assessment Questions Seen in ER on 05/31/24, follow up on 06/12/24. Was given a shot that day, began to feel better. He went to urologist the next day and he was placed on bactrim . He states Friday/Saturday he was very sick from the medicine, diarrhea so he stopped taking the medicine, called oncall urologist who told him they weren't concerned with his urine. He asked how can he have red blood cells and white blood cells in his urine and not have a UTI. RN education on urine collection and testing and possible causes. Pt states understanding. He states Sunday or yesterday he began to have the pain again. Burning in his testicles, sometimes burning with urination or over his bladder and when he's wearing pants or underwear and it hits on his bladder.  Per note if not improved or worsened to be seen back. Pt is scheduled.      1. LOCATION and RADIATION: Where is the pain located?      testicles 2. QUALITY: What does the pain feel like?  (e.g., sharp, dull, aching, burning)     burning 3. SEVERITY: How bad is the pain?  (Scale 1-10; or mild, moderate, severe)     Mod to severe at times 4. ONSET: When did the pain start?     Been on going 5. PATTERN: Does it come and go, or has it been constant since it started?     States testicles are all the time, the others are intermittent.  6. SCROTAL APPEARANCE: What does the scrotum look like? Is there any swelling or redness?      Denies redness or swelling 7. HERNIA: Has a doctor ever told you that you have a hernia?      8. OTHER SYMPTOMS: Do you have any other symptoms? (e.g., abdomen pain, difficulty passing urine, fever, vomiting)     denies  Protocols used: Scrotum Pain-A-AH

## 2024-06-19 NOTE — Telephone Encounter (Signed)
 Apt scheduled.

## 2024-06-20 ENCOUNTER — Encounter: Payer: Self-pay | Admitting: Nurse Practitioner

## 2024-06-20 ENCOUNTER — Ambulatory Visit (INDEPENDENT_AMBULATORY_CARE_PROVIDER_SITE_OTHER): Admitting: Nurse Practitioner

## 2024-06-20 ENCOUNTER — Ambulatory Visit: Payer: Self-pay | Admitting: Nurse Practitioner

## 2024-06-20 VITALS — BP 129/62 | HR 59 | Temp 98.2°F | Ht 72.0 in | Wt 191.0 lb

## 2024-06-20 DIAGNOSIS — R351 Nocturia: Secondary | ICD-10-CM

## 2024-06-20 DIAGNOSIS — K5900 Constipation, unspecified: Secondary | ICD-10-CM | POA: Diagnosis not present

## 2024-06-20 DIAGNOSIS — R300 Dysuria: Secondary | ICD-10-CM

## 2024-06-20 LAB — URINALYSIS, ROUTINE W REFLEX MICROSCOPIC
Bilirubin, UA: NEGATIVE
Glucose, UA: NEGATIVE
Ketones, UA: NEGATIVE
Nitrite, UA: NEGATIVE
Protein,UA: NEGATIVE
Specific Gravity, UA: 1.02 (ref 1.005–1.030)
Urobilinogen, Ur: 0.2 mg/dL (ref 0.2–1.0)
pH, UA: 5.5 (ref 5.0–7.5)

## 2024-06-20 LAB — MICROSCOPIC EXAMINATION
Renal Epithel, UA: NONE SEEN /HPF
Yeast, UA: NONE SEEN

## 2024-06-20 MED ORDER — SENNOSIDES-DOCUSATE SODIUM 8.6-50 MG PO TABS: 1.0000 | ORAL_TABLET | Freq: Every evening | ORAL | 0 refills | Status: AC | PRN

## 2024-06-20 MED ORDER — FOSFOMYCIN TROMETHAMINE 3 G PO PACK: 3.0000 g | PACK | Freq: Once | ORAL | 0 refills | Status: AC

## 2024-06-20 NOTE — Progress Notes (Signed)
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 sense of fuzziness and dizziness at the left side of his head   Cholestyramine  Nausea Only   Doxazosin Diarrhea   Doxycycline  Diarrhea   Omeprazole  Other (See Comments)   Pantoprazole  Other (See Comments)    Can tolerate 20mg    Pravastatin  Other (See Comments)   Rosuvastatin  Other (See Comments)   Zofran  [Ondansetron ]     seizure   Augmentin  [Amoxicillin -Pot Clavulanate] Diarrhea   Ciprofloxacin  Diarrhea   Codeine Nausea And Vomiting and Other (See Comments)    Severe headache   Levaquin  [Levofloxacin ] Diarrhea   Oxycodone  Nausea And Vomiting   Tramadol  Nausea And Vomiting   Vicodin [Hydrocodone-Acetaminophen ] Nausea And Vomiting   "

## 2024-06-21 LAB — PSA, TOTAL AND FREE
PSA, Free Pct: 33.3 %
PSA, Free: 0.1 ng/mL
Prostate Specific Ag, Serum: 0.3 ng/mL (ref 0.0–4.0)

## 2024-06-22 LAB — URINE CULTURE: Organism ID, Bacteria: NO GROWTH

## 2024-06-27 ENCOUNTER — Other Ambulatory Visit: Payer: Self-pay | Admitting: Family Medicine

## 2024-06-27 DIAGNOSIS — K219 Gastro-esophageal reflux disease without esophagitis: Secondary | ICD-10-CM

## 2024-07-02 ENCOUNTER — Other Ambulatory Visit: Payer: Self-pay

## 2024-07-02 ENCOUNTER — Ambulatory Visit: Payer: Self-pay

## 2024-07-02 ENCOUNTER — Encounter (HOSPITAL_COMMUNITY): Payer: Self-pay | Admitting: Emergency Medicine

## 2024-07-02 ENCOUNTER — Emergency Department (HOSPITAL_COMMUNITY)
Admission: EM | Admit: 2024-07-02 | Discharge: 2024-07-03 | Disposition: A | Discharge status: Home / Self Care | Attending: Emergency Medicine | Admitting: Emergency Medicine

## 2024-07-02 DIAGNOSIS — K649 Unspecified hemorrhoids: Secondary | ICD-10-CM | POA: Diagnosis not present

## 2024-07-02 DIAGNOSIS — N451 Epididymitis: Secondary | ICD-10-CM | POA: Insufficient documentation

## 2024-07-02 DIAGNOSIS — Z79899 Other long term (current) drug therapy: Secondary | ICD-10-CM | POA: Diagnosis not present

## 2024-07-02 DIAGNOSIS — N50819 Testicular pain, unspecified: Secondary | ICD-10-CM | POA: Diagnosis present

## 2024-07-02 DIAGNOSIS — I2510 Atherosclerotic heart disease of native coronary artery without angina pectoris: Secondary | ICD-10-CM | POA: Insufficient documentation

## 2024-07-02 DIAGNOSIS — K625 Hemorrhage of anus and rectum: Secondary | ICD-10-CM

## 2024-07-02 DIAGNOSIS — Z87448 Personal history of other diseases of urinary system: Secondary | ICD-10-CM | POA: Insufficient documentation

## 2024-07-02 DIAGNOSIS — I10 Essential (primary) hypertension: Secondary | ICD-10-CM | POA: Diagnosis not present

## 2024-07-02 NOTE — Telephone Encounter (Signed)
 This RN called [facility] to report ED refusal. This RN spoke to [place].

## 2024-07-02 NOTE — Telephone Encounter (Signed)
 Pt scheduled with Oneil tomorrow at 2pm.

## 2024-07-02 NOTE — Telephone Encounter (Addendum)
 FYI Only or Action Required?: Action required by provider: clinical question for provider and refused ED.  Patient was last seen in primary care on 06/20/2024 by Deitra Morton Sebastian Nena, NP.  Called Nurse Triage reporting Hemorrhoids and Groin Swelling.  Symptoms began 1-2 months ago.  Interventions attempted: Other: previously completed levofloxacin .  Symptoms are: unchanged.  Triage Disposition: Go to ED Now (Notify PCP)  Patient/caregiver understands and will follow disposition?: No, wishes to speak with PCP                                  1. LOCATION and RADIATION: Where is the pain located?      Testicles 2. QUALITY: What does the pain feel like?  (e.g., sharp, dull, aching, burning)     Burning and stinging 3. SEVERITY: How bad is the pain?  (Scale 1-10; or mild, moderate, severe)     Denies pain at this time, but reports pain is intermittent 4. ONSET: When did the pain start?     1-2 months ago 5. PATTERN: Does it come and go, or has it been constant since it started?     Pain occurs on and off, but he experiences relief whenever he has a BM 6. SCROTAL APPEARANCE: What does the scrotum look like? Is there any swelling or redness?      Swelling present 8. OTHER SYMPTOMS: Do you have any other symptoms? (e.g., abdomen pain, difficulty passing urine, fever, vomiting)     Occassional diarrhea, hemorrhoids    Patient originally called in to report swollen hemorrhoids. Patient proceeded to report that he was evaluated in the office on 06/20/24 for testicular pain. Patient reported pain is still present and he is also experiencing swelling of his testicles. This RN advised ED. Patient declined. Patient would like provider to send in medication for hemorrhoids. Please advise. Patient would like a call back.   Reason for Disposition  Swollen scrotum  Protocols used: Scrotum Pain-A-AH  Reason for Triage: patient has swollen  hemorrhoids

## 2024-07-02 NOTE — ED Triage Notes (Signed)
 Pt c/o weakness since last month, pt states he has one episode of rectal bleeding today.

## 2024-07-03 ENCOUNTER — Emergency Department (HOSPITAL_COMMUNITY)

## 2024-07-03 ENCOUNTER — Encounter: Payer: Self-pay | Admitting: Family Medicine

## 2024-07-03 ENCOUNTER — Ambulatory Visit: Admitting: Family Medicine

## 2024-07-03 VITALS — BP 143/73 | HR 64 | Temp 97.7°F | Ht 72.0 in | Wt 194.0 lb

## 2024-07-03 DIAGNOSIS — K5900 Constipation, unspecified: Secondary | ICD-10-CM | POA: Diagnosis not present

## 2024-07-03 DIAGNOSIS — R197 Diarrhea, unspecified: Secondary | ICD-10-CM

## 2024-07-03 DIAGNOSIS — K625 Hemorrhage of anus and rectum: Secondary | ICD-10-CM

## 2024-07-03 LAB — URINALYSIS, ROUTINE W REFLEX MICROSCOPIC
Bilirubin Urine: NEGATIVE
Glucose, UA: NEGATIVE mg/dL
Ketones, ur: NEGATIVE mg/dL
Nitrite: NEGATIVE
Protein, ur: NEGATIVE mg/dL
Specific Gravity, Urine: 1.004 — ABNORMAL LOW (ref 1.005–1.030)
pH: 6 (ref 5.0–8.0)

## 2024-07-03 LAB — CBC WITH DIFFERENTIAL/PLATELET
Abs Immature Granulocytes: 0.01 K/uL (ref 0.00–0.07)
Basophils Absolute: 0 K/uL (ref 0.0–0.1)
Basophils Relative: 1 %
Eosinophils Absolute: 0.2 K/uL (ref 0.0–0.5)
Eosinophils Relative: 3 %
HCT: 44.5 % (ref 39.0–52.0)
Hemoglobin: 15.2 g/dL (ref 13.0–17.0)
Immature Granulocytes: 0 %
Lymphocytes Relative: 22 %
Lymphs Abs: 1.2 K/uL (ref 0.7–4.0)
MCH: 32.7 pg (ref 26.0–34.0)
MCHC: 34.2 g/dL (ref 30.0–36.0)
MCV: 95.7 fL (ref 80.0–100.0)
Monocytes Absolute: 0.7 K/uL (ref 0.1–1.0)
Monocytes Relative: 12 %
Neutro Abs: 3.5 K/uL (ref 1.7–7.7)
Neutrophils Relative %: 62 %
Platelets: 119 K/uL — ABNORMAL LOW (ref 150–400)
RBC: 4.65 MIL/uL (ref 4.22–5.81)
RDW: 13.6 % (ref 11.5–15.5)
WBC: 5.5 K/uL (ref 4.0–10.5)
nRBC: 0 % (ref 0.0–0.2)

## 2024-07-03 LAB — BASIC METABOLIC PANEL WITH GFR
Anion gap: 12 (ref 5–15)
BUN: 18 mg/dL (ref 8–23)
CO2: 23 mmol/L (ref 22–32)
Calcium: 9.1 mg/dL (ref 8.9–10.3)
Chloride: 107 mmol/L (ref 98–111)
Creatinine, Ser: 1.37 mg/dL — ABNORMAL HIGH (ref 0.61–1.24)
GFR, Estimated: 50 mL/min — ABNORMAL LOW
Glucose, Bld: 107 mg/dL — ABNORMAL HIGH (ref 70–99)
Potassium: 4 mmol/L (ref 3.5–5.1)
Sodium: 142 mmol/L (ref 135–145)

## 2024-07-03 MED ORDER — HYDROCORTISONE (PERIANAL) 2.5 % EX CREA: 1.0000 | TOPICAL_CREAM | Freq: Two times a day (BID) | CUTANEOUS | 1 refills | Status: AC

## 2024-07-03 NOTE — ED Provider Notes (Signed)
 " [place] EMERGENCY DEPARTMENT AT [facility] Provider Note   CSN: [id number] Arrival date & time: 07/02/24  2332     Patient presents with: Weakness   Ronald Orval Dortch Sr. is a 89 y.o. male.   Patient is an 89 year old male with extensive past medical history including coronary artery disease status post CABG, hypertension, hyperlipidemia, gout, bladder cancer.  Patient presenting today with complaints of testicular pain related to epididymitis and urinary frequency over the past month.  This evening he had a bowel movement and passed bright red blood.  He describes some burning in his anus and rectum, but denies any abdominal pain.  No fevers or chills.  He denies any dizziness or lightheadedness.       Prior to Admission medications  Medication Sig Start Date End Date Taking? Authorizing Provider  acetaminophen  (TYLENOL ) 500 MG tablet Take 500 mg by mouth as needed.    [provider]  Alirocumab  (PRALUENT ) 75 MG/ML SOAJ Inject 1 mL (75 mg total) into the skin every 14 (fourteen) days. 01/19/23   Okey Vina GAILS, MD  fluocinonide  cream (LIDEX ) 0.05 % APPLY TO AFFECTED AREA TWICE A DAY 03/04/23   Gladis, Mary-Margaret, FNP  hydrocortisone  (ANUSOL -HC) 25 MG suppository PLACE 1 SUPPOSITORY RECTALLY 2 TIMES DAILY. 06/17/22   Gladis, Mary-Margaret, FNP  lansoprazole  (PREVACID ) 15 MG capsule TAKE 1 CAPSULE BY MOUTH 2 TIMES DAILY BEFORE A MEAL. 06/27/24   Severa Rock HERO, FNP  levofloxacin  (LEVAQUIN ) 500 MG tablet Take 1 tablet (500 mg total) by mouth daily. 05/31/24   Idol, Julie, PA-C  metoprolol  succinate (TOPROL  XL) 25 MG 24 hr tablet Take 0.5 tablets (12.5 mg total) by mouth daily. 05/29/24   Strader, Laymon HERO, PA-C  nitroGLYCERIN  (NITROSTAT ) 0.4 MG SL tablet Place 1 tablet (0.4 mg total) under the tongue every 5 (five) minutes x 3 doses as needed for chest pain. 09/28/23   Darryle Thom CROME, PA-C  Potassium 99 MG TABS Take 1 tablet by mouth every other day.    [provider]  promethazine  (PHENERGAN ) 12.5 MG suppository Place 1 suppository (12.5 mg total) rectally every 8 (eight) hours as needed for nausea or vomiting. 05/31/24   Idol, Julie, PA-C  psyllium (METAMUCIL SMOOTH TEXTURE) 58.6 % powder Take 1 packet by mouth 3 (three) times daily. 07/08/23   Severa Rock HERO, FNP  senna-docusate (SENOKOT-S) 8.6-50 MG tablet Take 1 tablet by mouth at bedtime as needed for mild constipation. 06/20/24   St Morton Sebastian Pool, NP  VITAMIN D  PO Take 1 tablet by mouth daily.    [provider]    Allergies: Uloric  [febuxostat ], Cholestyramine , Doxazosin, Doxycycline , Omeprazole , Pantoprazole , Pravastatin , Rosuvastatin , Zofran  [ondansetron ], Augmentin  [amoxicillin -pot clavulanate], Ciprofloxacin , Codeine, Levaquin  [levofloxacin ], Oxycodone , Tramadol , and Vicodin [hydrocodone-acetaminophen ]    Review of Systems  All other systems reviewed and are negative.   Updated Vital Signs BP 125/75   Pulse 64   Temp 98 F (36.7 C) (Oral)   Resp (!) 24   Ht 6' (1.829 m)   Wt 86.6 kg   SpO2 95%   BMI 25.89 kg/m   Physical Exam Vitals and nursing note reviewed.  Constitutional:      General: He is not in acute distress.    Appearance: He is well-developed. He is not diaphoretic.  HENT:     Head: Normocephalic and atraumatic.  Cardiovascular:     Rate and Rhythm: Normal rate and regular rhythm.     Heart sounds: No murmur heard.  No friction rub.  Pulmonary:     Effort: Pulmonary effort is normal. No respiratory distress.     Breath sounds: Normal breath sounds. No wheezing or rales.  Abdominal:     General: Bowel sounds are normal. There is no distension.     Palpations: Abdomen is soft.     Tenderness: There is no abdominal tenderness.  Genitourinary:    Comments: Rectal examination reveals left and right sided hemorrhoids that appear somewhat inflamed, but are not actively bleeding. Musculoskeletal:        General: Normal range of motion.      Cervical back: Normal range of motion and neck supple.  Skin:    General: Skin is warm and dry.  Neurological:     Mental Status: He is alert and oriented to person, place, and time.     Coordination: Coordination normal.     (all labs ordered are listed, but only abnormal results are displayed) Labs Reviewed  CBC WITH DIFFERENTIAL/PLATELET - Abnormal; Notable for the following components:      Result Value   Platelets 119 (*)    All other components within normal limits  BASIC METABOLIC PANEL WITH GFR  URINALYSIS, ROUTINE W REFLEX MICROSCOPIC    EKG: EKG Interpretation Date/Time:  Monday July 02 2024 23:51:47 EST Ventricular Rate:  64 PR Interval:  236 QRS Duration:  134 QT Interval:  408 QTC Calculation: 421 R Axis:   -47  Text Interpretation: Sinus or ectopic atrial rhythm Prolonged PR interval RBBB and LAFB No significant change since 09/28/2023 Confirmed by Geroldine Berg ([id number]) on 07/02/2024 11:57:49 PM  Radiology: No results found.   Procedures   Medications Ordered in the ED - No data to display                                  Medical Decision Making Amount and/or Complexity of Data Reviewed Labs: ordered. Radiology: ordered.   Patient is an 89 year old male presenting with complaints of rectal bleeding.  He has been on Levaquin  for the past several weeks after being diagnosed with epididymitis.  Patient arrives here with stable vital signs and is afebrile.  There is no tachycardia or hypotension.  Physical examination reveals a benign abdomen and two moderate-sized hemorrhoids that are not actively bleeding, but do appear inflamed.  Laboratory studies obtained including CBC and basic metabolic panel.  Hemoglobin is 15.2 with no leukocytosis.  Metabolic panel is unremarkable.  Urinalysis shows small leukocytes and 11-20 white cells.  CT was renal protocol obtained to rule out kidney stone or diverticulitis.  This was unremarkable.  At this point, I  suspect his bleeding was likely hemorrhoidal given the clinical situation.  He has had no further bleeding while in the ER and his hemoglobin is 15.2.  I feel as though patient can safely be discharged.  I will prescribe Anusol  cream and have him alternate this with Preparation H.  I have also advised stool softeners and fiber supplementation.  Patient has a GI doctor and I have advised him to follow-up with them if his symptoms persist.     Final diagnoses:  None    ED Discharge Orders     None          Geroldine Berg, MD 07/03/24 0150  "

## 2024-07-03 NOTE — Progress Notes (Signed)
 "  Acute Office Visit  Patient ID: Ronald Lyford., male    DOB: Oct 24, 1934, 89 y.o.   MRN: [id number]  PCP: Severa Rock HERO, FNP  Chief Complaint  Patient presents with   ER FOLLOW UP    Patient was seen at [facility] ER last night for anal bleeding. Patient had a CT scan performed and needs results and advise on next steps.  Patient reports testicle pain, anal pain, and anal bleeding. Patient says he had a bowel movement last night that was very tiny but no blood.     Subjective:     HPI  Discussed the use of AI scribe software for clinical note transcription with the patient, who gave verbal consent to proceed.  History of Present Illness   Ronald Russell is an 89 year old male who presents with constipation concerns.   Rectal bleeding - Patient went to the ED for rectal bleeding on yesterday 07/02/2024  - Hemoglobin was 15.2 - CT was obtained to rule out kidney stone or diverticulitis in the notes state that the results were unremarkable.  - The ED felt that the bleeding was likely hemorrhoidal and prescribed the patient Anusol  cream and Preparation H.  They also advised stool softeners.  - Denies any rectal bleeding today.    Constipation and altered bowel habits - Alternating episodes of constipation and diarrhea (this has been a chronic problem) - Patient reports having diarrhea yesterday and then today only been able to pass 2 small firm stools. - No use of laxatives - Reports trying Metamucil in the past per recommendation of the provider but states that it caused constipation. - As seen GI in the past but states that he is not a candidate for colonoscopy due to his age.  Testicular pain - Patient was seen in office 05/16/2024 for possible epididymitis where he was treated with Rocephin . - Patient to follow-up with urology on the next day and was started on Bactrim . - Patient states that drinking coffee alleviates testicular pain  Urologic history -  History of bladder tumors: three tumors removed in 2010, additional spots scraped off in 2023 - Currently on yearly follow-up schedule with urology - Has urology appointment again next month       ROS     Objective:    BP (!) 143/73 (Cuff Size: Large)   Pulse 64   Temp 97.7 F (36.5 C)   Ht 6' (1.829 m)   Wt 194 lb (88 kg)   SpO2 95%   BMI 26.31 kg/m    Physical Exam Vitals reviewed.  Constitutional:      Appearance: Normal appearance.  HENT:     Head: Normocephalic and atraumatic.  Eyes:     Extraocular Movements: Extraocular movements intact.     Conjunctiva/sclera: Conjunctivae normal.     Pupils: Pupils are equal, round, and reactive to light.  Cardiovascular:     Rate and Rhythm: Normal rate and regular rhythm.     Pulses: Normal pulses.     Heart sounds: Normal heart sounds. No murmur heard. Pulmonary:     Effort: Pulmonary effort is normal. No respiratory distress.     Breath sounds: Normal breath sounds.  Abdominal:     Palpations: Abdomen is soft.     Tenderness: There is no abdominal tenderness.  Musculoskeletal:        General: No deformity. Normal range of motion.  Skin:    General: Skin is warm and dry.  Neurological:     General: No focal deficit present.     Mental Status: He is alert and oriented to person, place, and time.  Psychiatric:        Mood and Affect: Mood normal.        Behavior: Behavior normal.       Results for orders placed or performed during the hospital encounter of 07/02/24  Basic metabolic panel  Result Value Ref Range   Sodium 142 135 - 145 mmol/L   Potassium 4.0 3.5 - 5.1 mmol/L   Chloride 107 98 - 111 mmol/L   CO2 23 22 - 32 mmol/L   Glucose, Bld 107 (H) 70 - 99 mg/dL   BUN 18 8 - 23 mg/dL   Creatinine, Ser 8.62 (H) 0.61 - 1.24 mg/dL   Calcium  9.1 8.9 - 10.3 mg/dL   GFR, Estimated 50 (L) >60 mL/min   Anion gap 12 5 - 15  CBC with Differential  Result Value Ref Range   WBC 5.5 4.0 - 10.5 K/uL   RBC 4.65  4.22 - 5.81 MIL/uL   Hemoglobin 15.2 13.0 - 17.0 g/dL   HCT 55.4 60.9 - 47.9 %   MCV 95.7 80.0 - 100.0 fL   MCH 32.7 26.0 - 34.0 pg   MCHC 34.2 30.0 - 36.0 g/dL   RDW 86.3 88.4 - 84.4 %   Platelets 119 (L) 150 - 400 K/uL   nRBC 0.0 0.0 - 0.2 %   Neutrophils Relative % 62 %   Neutro Abs 3.5 1.7 - 7.7 K/uL   Lymphocytes Relative 22 %   Lymphs Abs 1.2 0.7 - 4.0 K/uL   Monocytes Relative 12 %   Monocytes Absolute 0.7 0.1 - 1.0 K/uL   Eosinophils Relative 3 %   Eosinophils Absolute 0.2 0.0 - 0.5 K/uL   Basophils Relative 1 %   Basophils Absolute 0.0 0.0 - 0.1 K/uL   Immature Granulocytes 0 %   Abs Immature Granulocytes 0.01 0.00 - 0.07 K/uL  Urinalysis, Routine w reflex microscopic -Urine, Clean Catch  Result Value Ref Range   Color, Urine STRAW (A) YELLOW   APPearance CLEAR CLEAR   Specific Gravity, Urine 1.004 (L) 1.005 - 1.030   pH 6.0 5.0 - 8.0   Glucose, UA NEGATIVE NEGATIVE mg/dL   Hgb urine dipstick MODERATE (A) NEGATIVE   Bilirubin Urine NEGATIVE NEGATIVE   Ketones, ur NEGATIVE NEGATIVE mg/dL   Protein, ur NEGATIVE NEGATIVE mg/dL   Nitrite NEGATIVE NEGATIVE   Leukocytes,Ua SMALL (A) NEGATIVE   RBC / HPF 0-5 0 - 5 RBC/hpf   WBC, UA 11-20 0 - 5 WBC/hpf   Bacteria, UA RARE (A) NONE SEEN   Squamous Epithelial / HPF 0-5 0 - 5 /HPF       Assessment & Plan:   Problem List Items Addressed This Visit       Other   Constipation - Primary   Other Visit Diagnoses       Diarrhea, unspecified type         Rectal bleeding           Assessment and Plan    Constipation and diarrhea - Discussed possible encopresis symptoms? Reports metamucil worsens constipation.  - Possible IBS symptoms?  - Recommended trial of MiraLAX 0.5-1 capful per day to see if BMs can be more consistent. - F/u with GI   Hemorrhoids with rectal bleeding - Continue using Preparation H and Anusol  for hemorrhoid management.   Testicular pain -  Improved at this time - Reports having 2 more  days of antibiotics to complete - Has follow-up with urology next month  Follow up if symptoms acutely worsen, no improvement over the next 2-3 days, fever develops, or for any other concerns        No orders of the defined types were placed in this encounter.   Return if symptoms worsen or fail to improve.  Oneil LELON Severin, FNP [facility]   "

## 2024-07-03 NOTE — Patient Instructions (Signed)
 Follow up if symptoms acutely worsen, no improvement over the next 2-3 days, fever develops, or for any other concerns

## 2024-07-03 NOTE — Discharge Instructions (Signed)
 Begin using Anusol  cream twice daily as prescribed.  Alternate at regular intervals with Preparation H for relief of your hemorrhoids.  Begin taking Colace (equate stool softener) 100 mg twice daily.  Supplement fiber into your diet whether this is in the form of high fiber foods or Metamucil, 1 heaping teaspoon in a glass of water  twice daily.  Follow-up with your primary doctor or gastroenterologist if symptoms persist, and return to the ER if symptoms significantly worsen or change.

## 2024-07-12 ENCOUNTER — Ambulatory Visit: Payer: Self-pay

## 2024-07-12 NOTE — Telephone Encounter (Signed)
 Patient aware and verbalizes understanding.

## 2024-07-12 NOTE — Telephone Encounter (Signed)
 Patient states his temp was 99.1 and mark told him to call him and let them know. Denies any palpitations or hematuria.

## 2024-07-12 NOTE — Telephone Encounter (Signed)
 FYI Only or Action Required?: Action required by provider: request for appointment, clinical question for provider, and update on patient condition.  Patient was last seen in primary care on 07/03/2024 by Alcus Oneil ORN, FNP.  Called Nurse Triage reporting Hematuria, Fever, and Palpitations.  Symptoms began today.  Interventions attempted: OTC medications: tylenol , Rest, hydration, or home remedies, and Other: ED visits, PCP visits, urology visits.  Symptoms are: rapidly worsening.  Triage Disposition: No disposition on file.  Patient/caregiver understands and will follow disposition?: No, wishes to speak with PCP       Reason for Disposition  Patient sounds very sick or weak to the triager  Answer Assessment - Initial Assessment Questions This RN recommended pt be examined in hospital, pt refusing, stating that Dr. Geroldine at ED on 1/19 told him you already reached the end of my intelligence and to see PCP, Gregson NP on 1/20 that if notice fever then call back. Advised pt get to hospital asap if any new or worsening symptoms. Sending message to PCP office for call back to pt with further recommendations. Alerted [facility] to ED refusal.       Pt stating he's been in care of [facility], [facility], and [facility] since 12/18 about blood in urine and other symptoms  Recent ED visit 1/19 for rectal bleeding  1. COLOR of URINE: Describe the color of the urine.  (e.g., tea-colored, pink, red, bloody) Do you have blood clots in your urine? (e.g., none, pea, grape, small coin)     No blood in urine that can tell Just found trace blood and WBC at hospital and [facility] and urology  4. PAIN with URINATION: Is there any pain with passing your urine? If Yes, ask: How bad is the pain?  (Scale 1-10; or mild, moderate, severe)     Yes been taking tylenol  because pain since 12/18  5. FEVER: Do you have a fever? If Yes, ask: What is your temperature, how was it measured, and when  did it start?     Been taking tylenol  round clock since 12/18 but was overdue for tylenol  when woke up this morning, just found that tylenol  been masking the temp Woke up this morning 99.1, sweating  7. OTHER SYMPTOMS: Do you have any other symptoms? (e.g., back/flank pain, abdomen pain, vomiting)     Cold/clammy skin back of neck and eyelids Not too weak to stand A little shaky but still standing up When first wake up heart racing and pounding Still full streams but takes a while, not usual for him, when went to ED 12/18, been going couple days at that point No rectal bleeding since hospital, but was pretty bad that one time Always dizziness but no worse than usual Last night heart skipped a little bit, take atenolol  for that Felt like heart butterflies and felt my pulse and felt it skip but not done it since then, thought from all tylenol  Not taken tylenol  since last night 6 pm  Denies: Cold/clammy all over Too weak to stand Chest pain SOB Current rectal bleeding Current heart palpitations Passing pure blood  Protocols used: Urine - Blood In-A-AH

## 2024-07-15 ENCOUNTER — Telehealth: Payer: Self-pay

## 2024-07-15 NOTE — Telephone Encounter (Signed)
 Monday 07/16/24 appointments canceled. Patient is aware and scheduling will call to reschedule.

## 2024-07-16 ENCOUNTER — Inpatient Hospital Stay

## 2024-07-16 ENCOUNTER — Inpatient Hospital Stay: Admitting: Oncology

## 2024-08-02 ENCOUNTER — Ambulatory Visit: Admitting: Student

## 2024-08-14 ENCOUNTER — Inpatient Hospital Stay: Admitting: Oncology

## 2024-08-14 ENCOUNTER — Inpatient Hospital Stay

## 2024-12-10 ENCOUNTER — Ambulatory Visit: Payer: Self-pay
